# Patient Record
Sex: Female | Born: 1955 | Race: White | Hispanic: No | Marital: Single | Smoking: Never smoker
Health system: Southern US, Community
[De-identification: ages and names within clinical notes are randomized; demographics above are authoritative.]

## PROBLEM LIST (undated history)

## (undated) DIAGNOSIS — I4891 Unspecified atrial fibrillation: Secondary | ICD-10-CM

## (undated) DIAGNOSIS — I351 Nonrheumatic aortic (valve) insufficiency: Secondary | ICD-10-CM

## (undated) DIAGNOSIS — I10 Essential (primary) hypertension: Secondary | ICD-10-CM

## (undated) DIAGNOSIS — I639 Cerebral infarction, unspecified: Secondary | ICD-10-CM

## (undated) HISTORY — PX: ABDOMINAL HYSTERECTOMY: SHX81

## (undated) HISTORY — PX: KNEE SURGERY: SHX244

## (undated) HISTORY — DX: Cerebral infarction, unspecified: I63.9

---
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
## 2014-08-14 HISTORY — PX: SHOULDER SURGERY: SHX246

## 2021-05-03 HISTORY — PX: ABLATION: SHX5711

## 2021-09-25 ENCOUNTER — Emergency Department: Payer: Medicare Other

## 2021-09-25 ENCOUNTER — Other Ambulatory Visit: Payer: Self-pay

## 2021-09-25 ENCOUNTER — Inpatient Hospital Stay (HOSPITAL_COMMUNITY)
Admission: AD | Admit: 2021-09-25 | Discharge: 2021-10-01 | DRG: 064 | Disposition: A | Discharge status: Rehabilitation Facility | Payer: Medicare Other | Source: Other Acute Inpatient Hospital | Attending: Internal Medicine | Admitting: Internal Medicine

## 2021-09-25 ENCOUNTER — Emergency Department
Admission: EM | Admit: 2021-09-25 | Discharge: 2021-09-25 | Disposition: A | Discharge status: Other Acute Inpatient Hospital | Payer: Medicare Other | Attending: Emergency Medicine | Admitting: Emergency Medicine

## 2021-09-25 ENCOUNTER — Inpatient Hospital Stay (HOSPITAL_COMMUNITY): Payer: Medicare Other

## 2021-09-25 DIAGNOSIS — R112 Nausea with vomiting, unspecified: Secondary | ICD-10-CM | POA: Insufficient documentation

## 2021-09-25 DIAGNOSIS — I619 Nontraumatic intracerebral hemorrhage, unspecified: Secondary | ICD-10-CM | POA: Diagnosis present

## 2021-09-25 DIAGNOSIS — E876 Hypokalemia: Secondary | ICD-10-CM | POA: Diagnosis not present

## 2021-09-25 DIAGNOSIS — R232 Flushing: Secondary | ICD-10-CM | POA: Diagnosis not present

## 2021-09-25 DIAGNOSIS — G936 Cerebral edema: Secondary | ICD-10-CM | POA: Diagnosis present

## 2021-09-25 DIAGNOSIS — I4891 Unspecified atrial fibrillation: Secondary | ICD-10-CM | POA: Insufficient documentation

## 2021-09-25 DIAGNOSIS — G8194 Hemiplegia, unspecified affecting left nondominant side: Secondary | ICD-10-CM | POA: Diagnosis not present

## 2021-09-25 DIAGNOSIS — R7303 Prediabetes: Secondary | ICD-10-CM | POA: Diagnosis present

## 2021-09-25 DIAGNOSIS — I480 Paroxysmal atrial fibrillation: Secondary | ICD-10-CM | POA: Insufficient documentation

## 2021-09-25 DIAGNOSIS — I618 Other nontraumatic intracerebral hemorrhage: Secondary | ICD-10-CM | POA: Diagnosis not present

## 2021-09-25 DIAGNOSIS — I669 Occlusion and stenosis of unspecified cerebral artery: Secondary | ICD-10-CM

## 2021-09-25 DIAGNOSIS — R531 Weakness: Secondary | ICD-10-CM | POA: Diagnosis not present

## 2021-09-25 DIAGNOSIS — K219 Gastro-esophageal reflux disease without esophagitis: Secondary | ICD-10-CM | POA: Diagnosis present

## 2021-09-25 DIAGNOSIS — I161 Hypertensive emergency: Secondary | ICD-10-CM | POA: Diagnosis present

## 2021-09-25 DIAGNOSIS — M792 Neuralgia and neuritis, unspecified: Secondary | ICD-10-CM | POA: Diagnosis not present

## 2021-09-25 DIAGNOSIS — I69191 Dysphagia following nontraumatic intracerebral hemorrhage: Secondary | ICD-10-CM | POA: Diagnosis not present

## 2021-09-25 DIAGNOSIS — H538 Other visual disturbances: Secondary | ICD-10-CM | POA: Diagnosis not present

## 2021-09-25 DIAGNOSIS — I69192 Facial weakness following nontraumatic intracerebral hemorrhage: Secondary | ICD-10-CM | POA: Diagnosis not present

## 2021-09-25 DIAGNOSIS — K5900 Constipation, unspecified: Secondary | ICD-10-CM | POA: Diagnosis not present

## 2021-09-25 DIAGNOSIS — D649 Anemia, unspecified: Secondary | ICD-10-CM | POA: Diagnosis not present

## 2021-09-25 DIAGNOSIS — I615 Nontraumatic intracerebral hemorrhage, intraventricular: Secondary | ICD-10-CM | POA: Diagnosis present

## 2021-09-25 DIAGNOSIS — Z79899 Other long term (current) drug therapy: Secondary | ICD-10-CM | POA: Diagnosis not present

## 2021-09-25 DIAGNOSIS — Z9911 Dependence on respirator [ventilator] status: Secondary | ICD-10-CM | POA: Diagnosis not present

## 2021-09-25 DIAGNOSIS — R29713 NIHSS score 13: Secondary | ICD-10-CM | POA: Diagnosis present

## 2021-09-25 DIAGNOSIS — R1310 Dysphagia, unspecified: Secondary | ICD-10-CM | POA: Diagnosis present

## 2021-09-25 DIAGNOSIS — Z7901 Long term (current) use of anticoagulants: Secondary | ICD-10-CM | POA: Diagnosis not present

## 2021-09-25 DIAGNOSIS — I10 Essential (primary) hypertension: Secondary | ICD-10-CM | POA: Insufficient documentation

## 2021-09-25 DIAGNOSIS — E7800 Pure hypercholesterolemia, unspecified: Secondary | ICD-10-CM | POA: Diagnosis not present

## 2021-09-25 DIAGNOSIS — R5383 Other fatigue: Secondary | ICD-10-CM | POA: Diagnosis not present

## 2021-09-25 DIAGNOSIS — R4781 Slurred speech: Secondary | ICD-10-CM | POA: Diagnosis not present

## 2021-09-25 DIAGNOSIS — I69154 Hemiplegia and 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 left non-dominant side: Secondary | ICD-10-CM | POA: Diagnosis not present

## 2021-09-25 DIAGNOSIS — R32 Unspecified urinary incontinence: Secondary | ICD-10-CM | POA: Diagnosis not present

## 2021-09-25 DIAGNOSIS — J9601 Acute respiratory failure with hypoxia: Secondary | ICD-10-CM | POA: Diagnosis present

## 2021-09-25 DIAGNOSIS — E669 Obesity, unspecified: Secondary | ICD-10-CM | POA: Diagnosis not present

## 2021-09-25 DIAGNOSIS — Z87440 Personal history of urinary (tract) infections: Secondary | ICD-10-CM | POA: Diagnosis not present

## 2021-09-25 DIAGNOSIS — I69391 Dysphagia following cerebral infarction: Secondary | ICD-10-CM | POA: Diagnosis not present

## 2021-09-25 DIAGNOSIS — R519 Headache, unspecified: Secondary | ICD-10-CM | POA: Insufficient documentation

## 2021-09-25 DIAGNOSIS — E785 Hyperlipidemia, unspecified: Secondary | ICD-10-CM | POA: Diagnosis present

## 2021-09-25 DIAGNOSIS — I69254 Hemiplegia and hemiparesis following other nontraumatic intracranial hemorrhage affecting left non-dominant side: Secondary | ICD-10-CM

## 2021-09-25 DIAGNOSIS — R300 Dysuria: Secondary | ICD-10-CM | POA: Diagnosis not present

## 2021-09-25 DIAGNOSIS — N390 Urinary tract infection, site not specified: Secondary | ICD-10-CM | POA: Diagnosis not present

## 2021-09-25 DIAGNOSIS — M79605 Pain in left leg: Secondary | ICD-10-CM | POA: Diagnosis not present

## 2021-09-25 DIAGNOSIS — R1312 Dysphagia, oropharyngeal phase: Secondary | ICD-10-CM | POA: Diagnosis not present

## 2021-09-25 DIAGNOSIS — I610 Nontraumatic intracerebral hemorrhage in hemisphere, subcortical: Secondary | ICD-10-CM | POA: Diagnosis not present

## 2021-09-25 DIAGNOSIS — I351 Nonrheumatic aortic (valve) insufficiency: Secondary | ICD-10-CM | POA: Diagnosis not present

## 2021-09-25 DIAGNOSIS — I6389 Other cerebral infarction: Secondary | ICD-10-CM | POA: Diagnosis not present

## 2021-09-25 DIAGNOSIS — R202 Paresthesia of skin: Secondary | ICD-10-CM | POA: Diagnosis not present

## 2021-09-25 DIAGNOSIS — Z1152 Encounter for screening for COVID-19: Secondary | ICD-10-CM | POA: Diagnosis not present

## 2021-09-25 HISTORY — DX: Essential (primary) hypertension: I10

## 2021-09-25 HISTORY — DX: Nonrheumatic aortic (valve) insufficiency: I35.1

## 2021-09-25 HISTORY — DX: Unspecified atrial fibrillation: I48.91

## 2021-09-25 LAB — URINALYSIS, ROUTINE W REFLEX MICROSCOPIC
Bilirubin Urine: NEGATIVE
Glucose, UA: NEGATIVE mg/dL
Hgb urine dipstick: NEGATIVE
Ketones, ur: 5 mg/dL — AB
Leukocytes,Ua: NEGATIVE
Nitrite: NEGATIVE
Protein, ur: NEGATIVE mg/dL
Specific Gravity, Urine: 1.01 (ref 1.005–1.030)
pH: 6 (ref 5.0–8.0)

## 2021-09-25 LAB — DIFFERENTIAL
Abs Immature Granulocytes: 0.04 10*3/uL (ref 0.00–0.07)
Basophils Absolute: 0.1 10*3/uL (ref 0.0–0.1)
Basophils Relative: 1 %
Eosinophils Absolute: 0.2 10*3/uL (ref 0.0–0.5)
Eosinophils Relative: 2 %
Immature Granulocytes: 1 %
Lymphocytes Relative: 39 %
Lymphs Abs: 3.4 10*3/uL (ref 0.7–4.0)
Monocytes Absolute: 0.5 10*3/uL (ref 0.1–1.0)
Monocytes Relative: 5 %
Neutro Abs: 4.7 10*3/uL (ref 1.7–7.7)
Neutrophils Relative %: 52 %

## 2021-09-25 LAB — CBG MONITORING, ED: Glucose-Capillary: 108 mg/dL — ABNORMAL HIGH (ref 70–99)

## 2021-09-25 LAB — COMPREHENSIVE METABOLIC PANEL
ALT: 16 U/L (ref 0–44)
AST: 20 U/L (ref 15–41)
Albumin: 4.3 g/dL (ref 3.5–5.0)
Alkaline Phosphatase: 78 U/L (ref 38–126)
Anion gap: 12 (ref 5–15)
BUN: 13 mg/dL (ref 8–23)
CO2: 23 mmol/L (ref 22–32)
Calcium: 9.4 mg/dL (ref 8.9–10.3)
Chloride: 103 mmol/L (ref 98–111)
Creatinine, Ser: 0.66 mg/dL (ref 0.44–1.00)
GFR, Estimated: >60 mL/min (ref >60)
Glucose, Bld: 113 mg/dL — ABNORMAL HIGH (ref 70–99)
Potassium: 3.2 mmol/L — ABNORMAL LOW (ref 3.5–5.1)
Sodium: 138 mmol/L (ref 135–145)
Total Bilirubin: 0.7 mg/dL (ref 0.3–1.2)
Total Protein: 7.7 g/dL (ref 6.5–8.1)

## 2021-09-25 LAB — APTT: aPTT: 34 seconds (ref 24–36)

## 2021-09-25 LAB — POCT I-STAT 7, (LYTES, BLD GAS, ICA,H+H)
Acid-base deficit: 3 mmol/L — ABNORMAL HIGH (ref 0.0–2.0)
Bicarbonate: 22.5 mmol/L (ref 20.0–28.0)
Calcium, Ion: 1.2 mmol/L (ref 1.15–1.40)
HCT: 29 % — ABNORMAL LOW (ref 36.0–46.0)
Hemoglobin: 9.9 g/dL — ABNORMAL LOW (ref 12.0–15.0)
O2 Saturation: 98 %
Patient temperature: 36.2
Potassium: 4.1 mmol/L (ref 3.5–5.1)
Sodium: 143 mmol/L (ref 135–145)
TCO2: 24 mmol/L (ref 22–32)
pCO2 arterial: 38.3 mmHg (ref 32–48)
pH, Arterial: 7.373 (ref 7.35–7.45)
pO2, Arterial: 99 mmHg (ref 83–108)

## 2021-09-25 LAB — PROTIME-INR
INR: 1.2 (ref 0.8–1.2)
Prothrombin Time: 14.8 seconds (ref 11.4–15.2)

## 2021-09-25 LAB — CBC
HCT: 37.4 % (ref 36.0–46.0)
Hemoglobin: 12 g/dL (ref 12.0–15.0)
MCH: 29.3 pg (ref 26.0–34.0)
MCHC: 32.1 g/dL (ref 30.0–36.0)
MCV: 91.4 fL (ref 80.0–100.0)
Platelets: 273 10*3/uL (ref 150–400)
RBC: 4.09 MIL/uL (ref 3.87–5.11)
RDW: 14.4 % (ref 11.5–15.5)
WBC: 8.8 10*3/uL (ref 4.0–10.5)
nRBC: 0 % (ref 0.0–0.2)

## 2021-09-25 LAB — SODIUM: Sodium: 141 mmol/L (ref 135–145)

## 2021-09-25 IMAGING — CT CT HEAD W/O CM
4 series · 15 of 47 positions shown, 17 images · non-contrast
Comparison: Prior CT from earlier the same day.

CLINICAL DATA: Follow-up examination for intracranial hemorrhage.



[Series 2: head wo · axial · 0.42mm/px · z∈[-101,+9]mm · 6 of 32 slices shown, 8 images]
[im 5/32  brain]
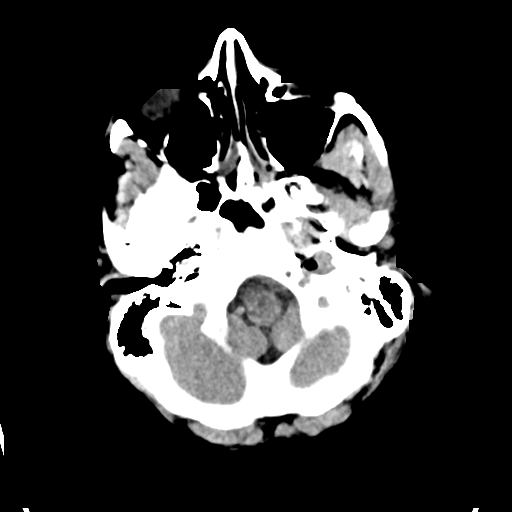
[im 5/32  bone]
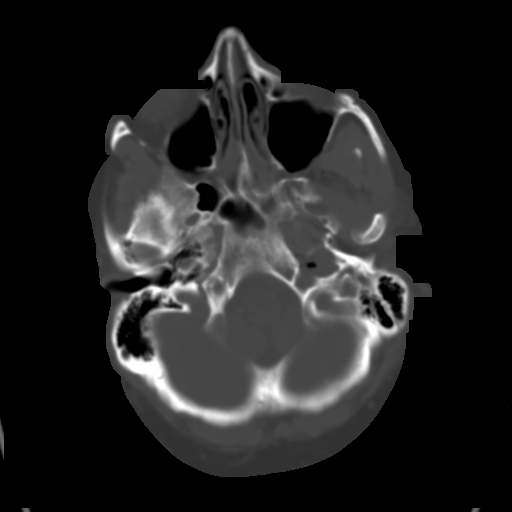
[im 9/32  brain]
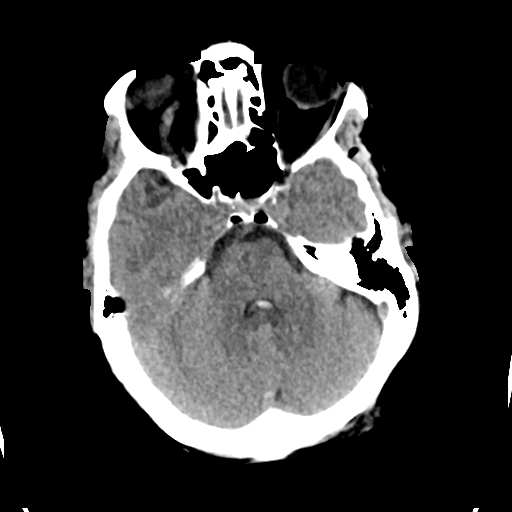
[im 14/32  brain]
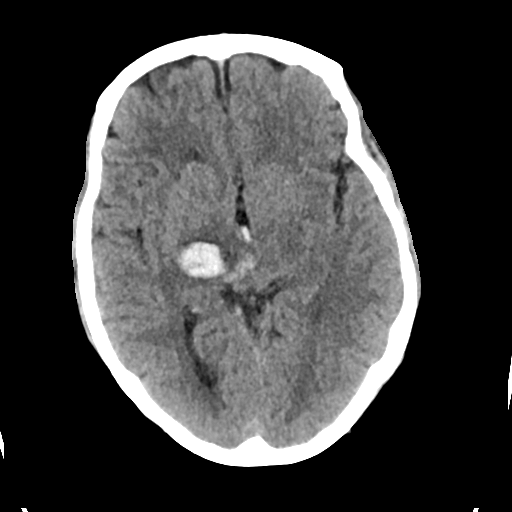
[im 18/32  brain]
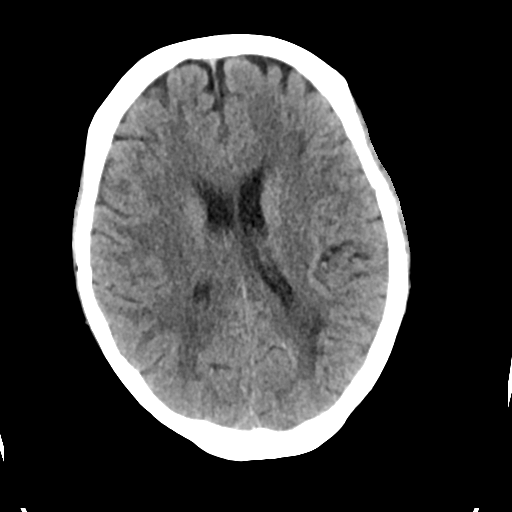
[im 23/32  brain]
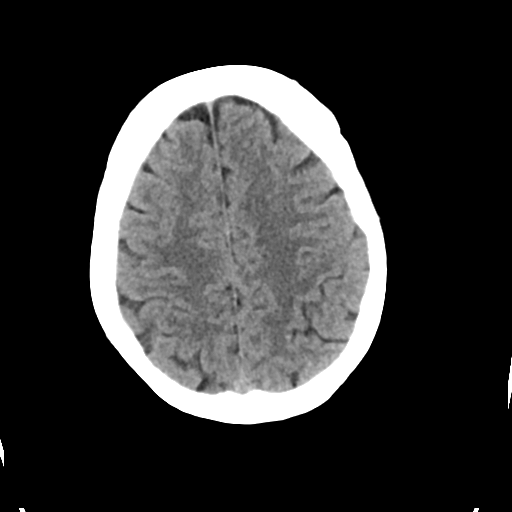
[im 23/32  bone]
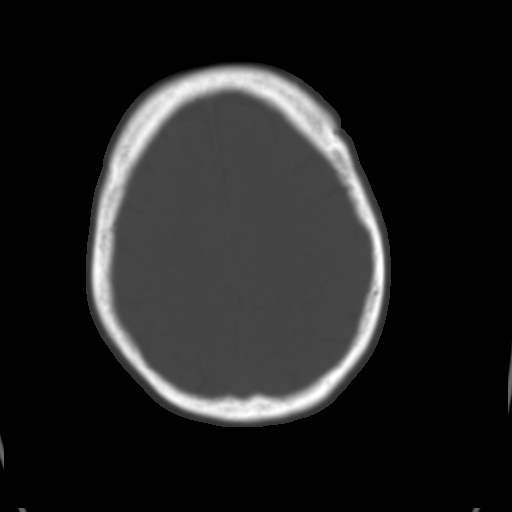
[im 27/32  brain]
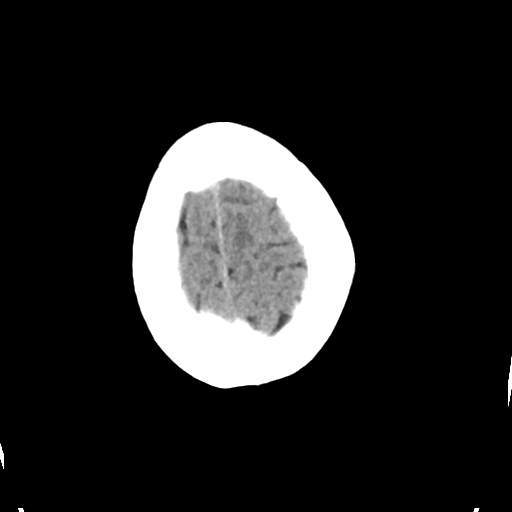

[Series 3: head bone · axial · 0.42mm/px · z∈[-107,-69]mm · 3 of 81 slices shown]
[im 8/81  bone]
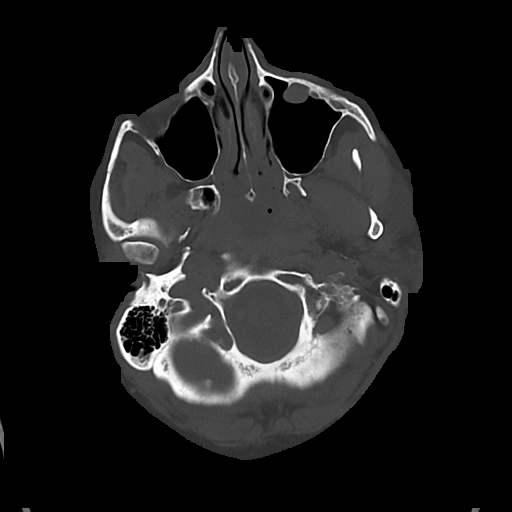
[im 16/81  bone]
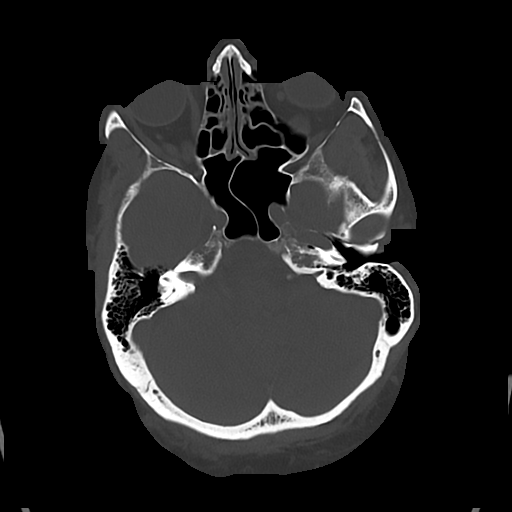
[im 27/81  bone]
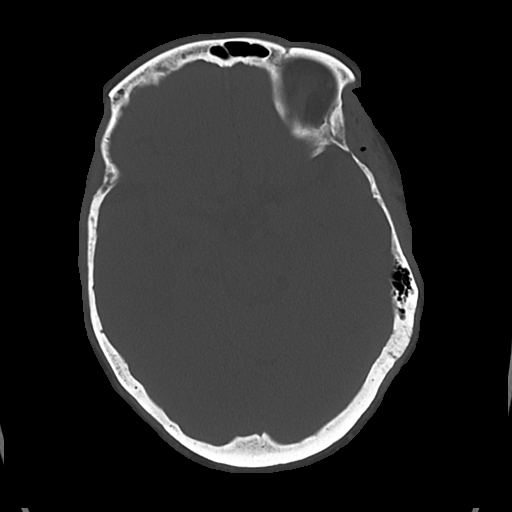

[Series 4: cor soft · coronal · 0.32mm/px · 3 of 66 slices shown]
[im 22/66  brain]
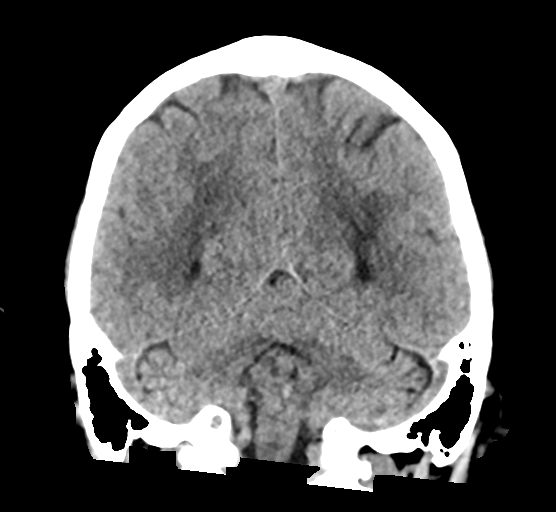
[im 29/66  brain]
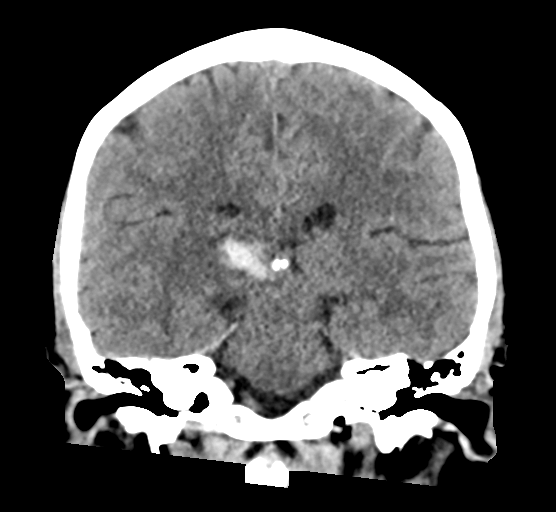
[im 37/66  brain]
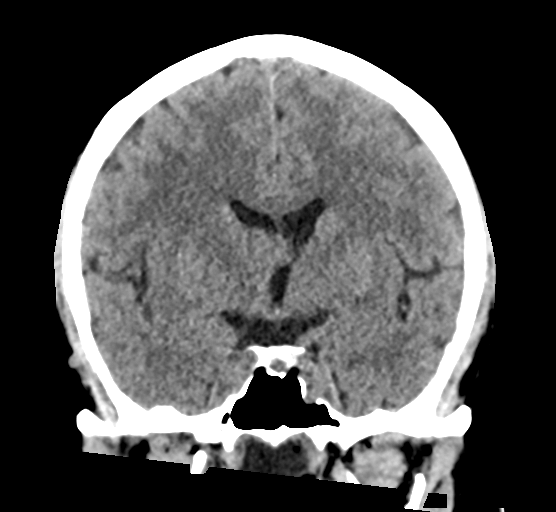

[Series 5: sag soft · sagittal · 0.32mm/px · 3 of 52 slices shown]
[im 18/52  brain]
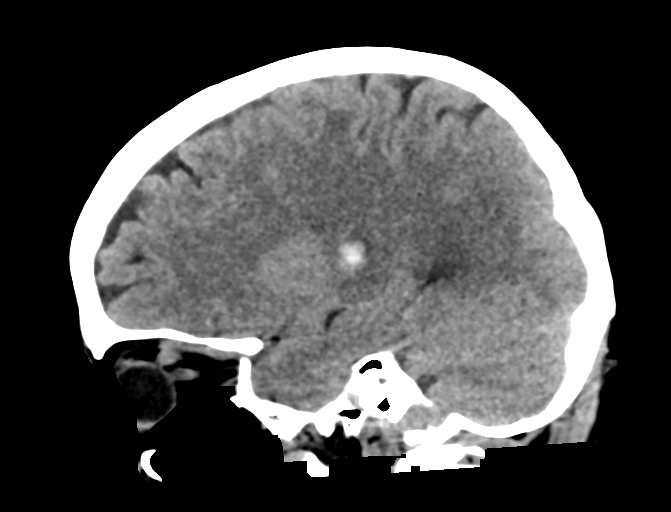
[im 26/52  brain]
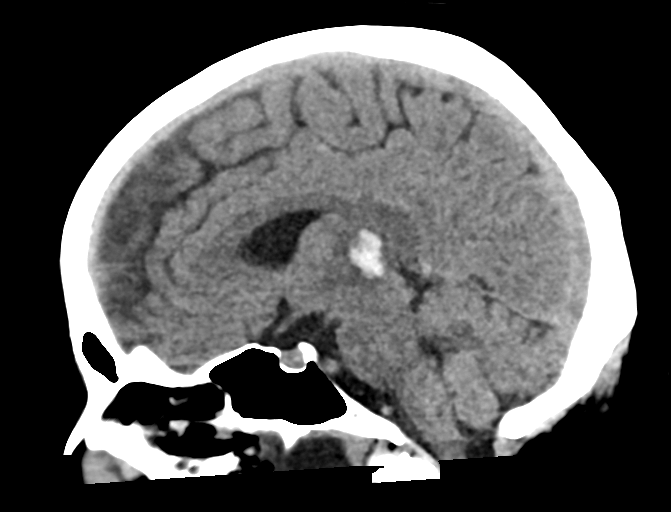
[im 35/52  brain]
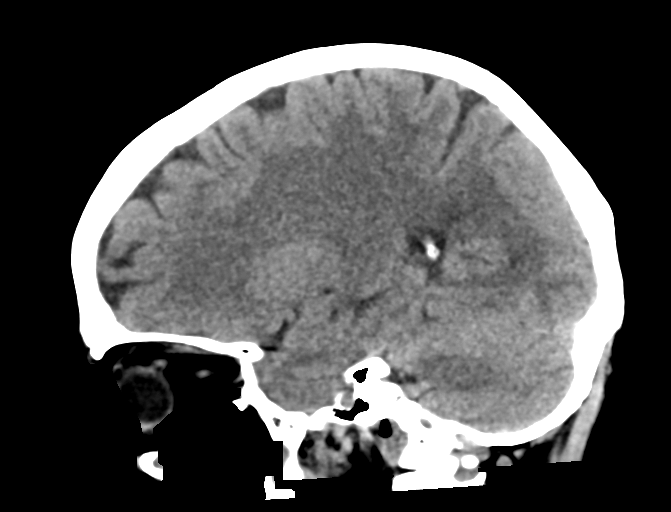

[15 of 47 positions shown; findings below may reference images not displayed]

FINDINGS: Brain: Previously identified acute intraparenchymal hemorrhage
centered at the right thalamus again seen, not significantly changed
in size measuring 2.2 x 1.5 x 2.3 cm (estimated volume 4 mL, stable
when measured in a similar fashion). Mild inferior extension towards
the right cerebral peduncle. Intraventricular extension with small
volume hemorrhage within the third and fourth ventricles, stable. No
hydrocephalus or trapping.

No other new acute intracranial hemorrhage. No acute large vessel
territory infarct. Scattered hypodensity involving the
supratentorial cerebral white matter noted, nonspecific, but most
likely related chronic microvascular ischemic disease. No mass
lesion or significant midline shift. No extra-axial fluid
collection.

Vascular: No hyperdense vessel.

Skull: Scalp soft tissues and calvarium within normal limits. Few
scattered foci of soft tissue emphysema within the left temporal
region likely related IV access.

Sinuses/Orbits: Globes and orbital soft tissues within normal
limits. Scattered mucosal thickening noted about the ethmoidal air
cells. Small retention cysts versus polyps noted within the
visualized left maxillary sinus. Mastoid air cells are clear.

Other: None.
IMPRESSION: 1. No significant interval change in size of acute intraparenchymal
hemorrhage centered at the right thalamus, estimated volume 4 mL.
Small volume intraventricular extension with blood in the third and
fourth ventricles. No hydrocephalus or trapping.
2. No other new acute intracranial abnormality.

## 2021-09-25 IMAGING — DX DG CHEST 1V
1 series · 1 of 1 positions shown · non-contrast
Comparison: Concurrently performed abdominal radiograph [DATE].

CLINICAL DATA: Provided history: Intubation. Status post intubation
and OG tube placement.

EXAM:
CHEST  1 VIEW

[chest ap]
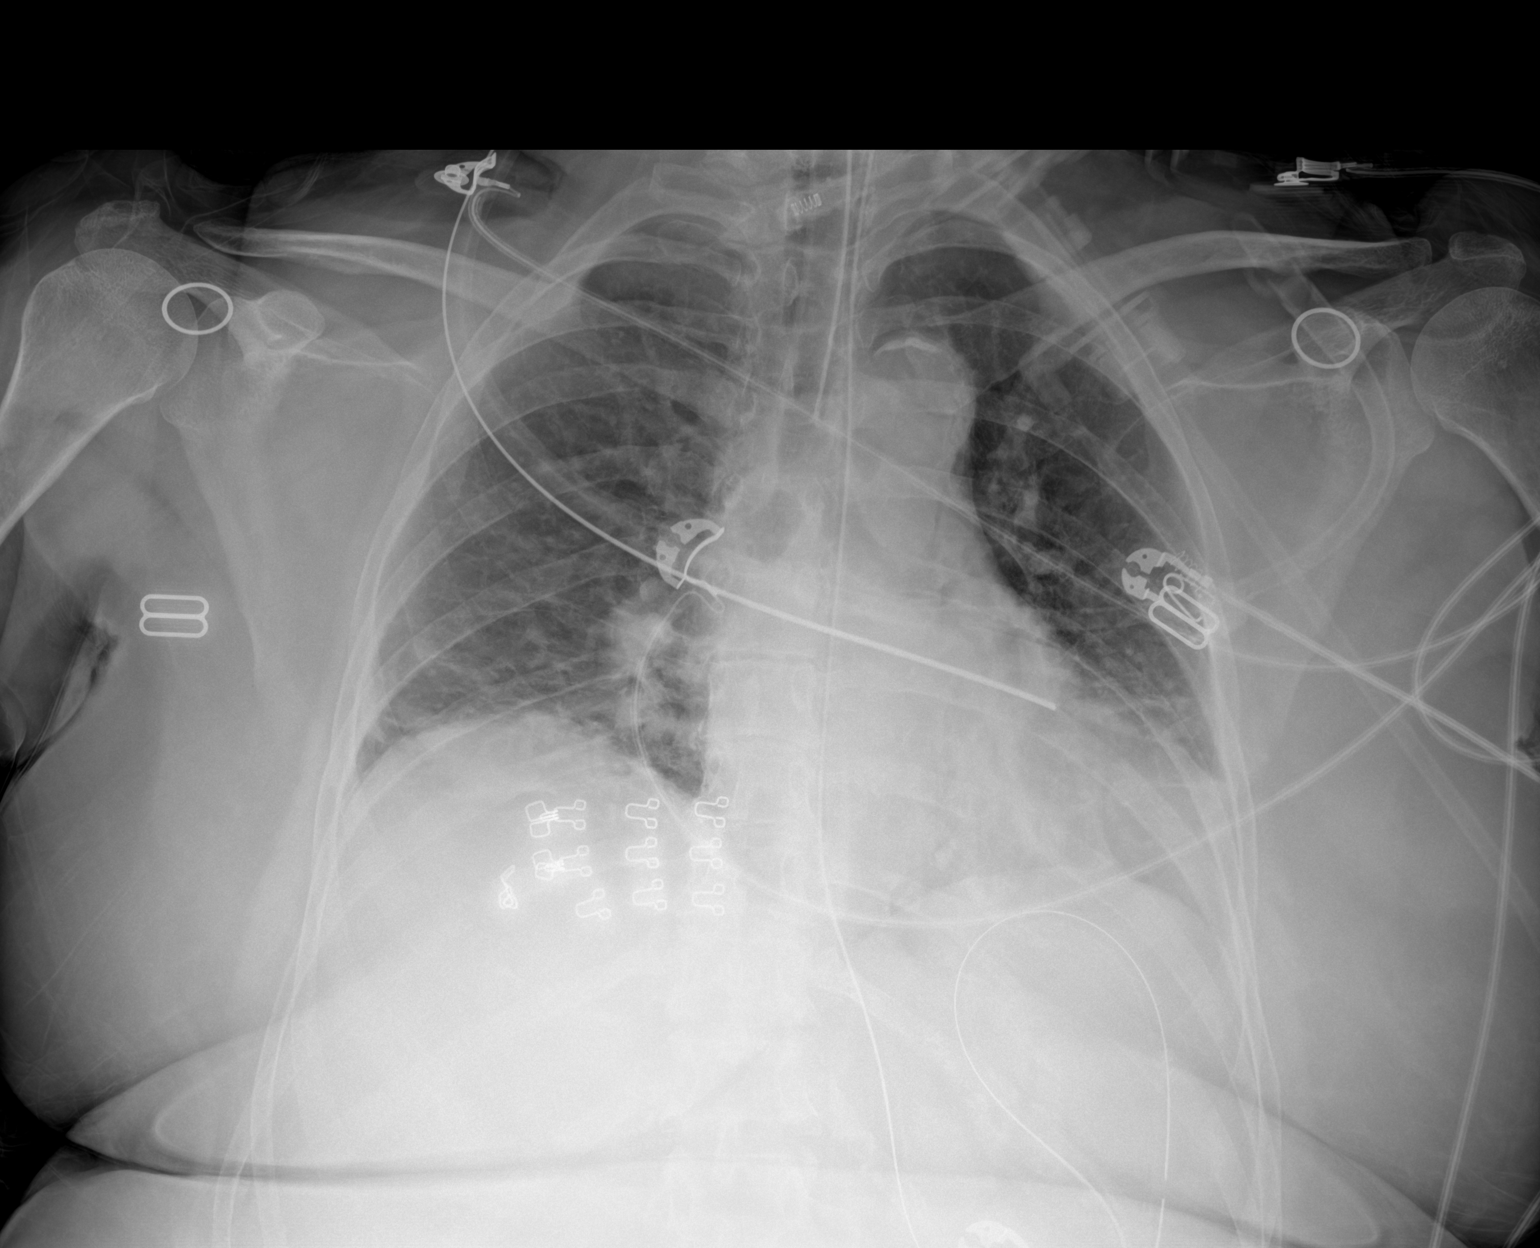

[1 of 1 positions shown; findings below may reference images not displayed]

FINDINGS: ET tube present with tip terminating 2.2 cm above the level of the
carina. An enteric tube passes below level of left hemidiaphragm and
is coiled within the stomach.

Heart size within normal limits. Aortic atherosclerosis. Mild
bibasilar atelectasis. No appreciable airspace consolidation. No
evidence of pleural effusion or pneumothorax. No acute bony
abnormality identified.
IMPRESSION: Support apparatus, as described.

Mild bibasilar atelectasis.

Aortic Atherosclerosis ([9S]-[9S]).

## 2021-09-25 IMAGING — DX DG ABDOMEN 1V
1 series · 1 of 1 positions shown · non-contrast
Comparison: None Available.

CLINICAL DATA: Placement of NG tube

EXAM:
ABDOMEN - 1 VIEW

[abdomen supine]
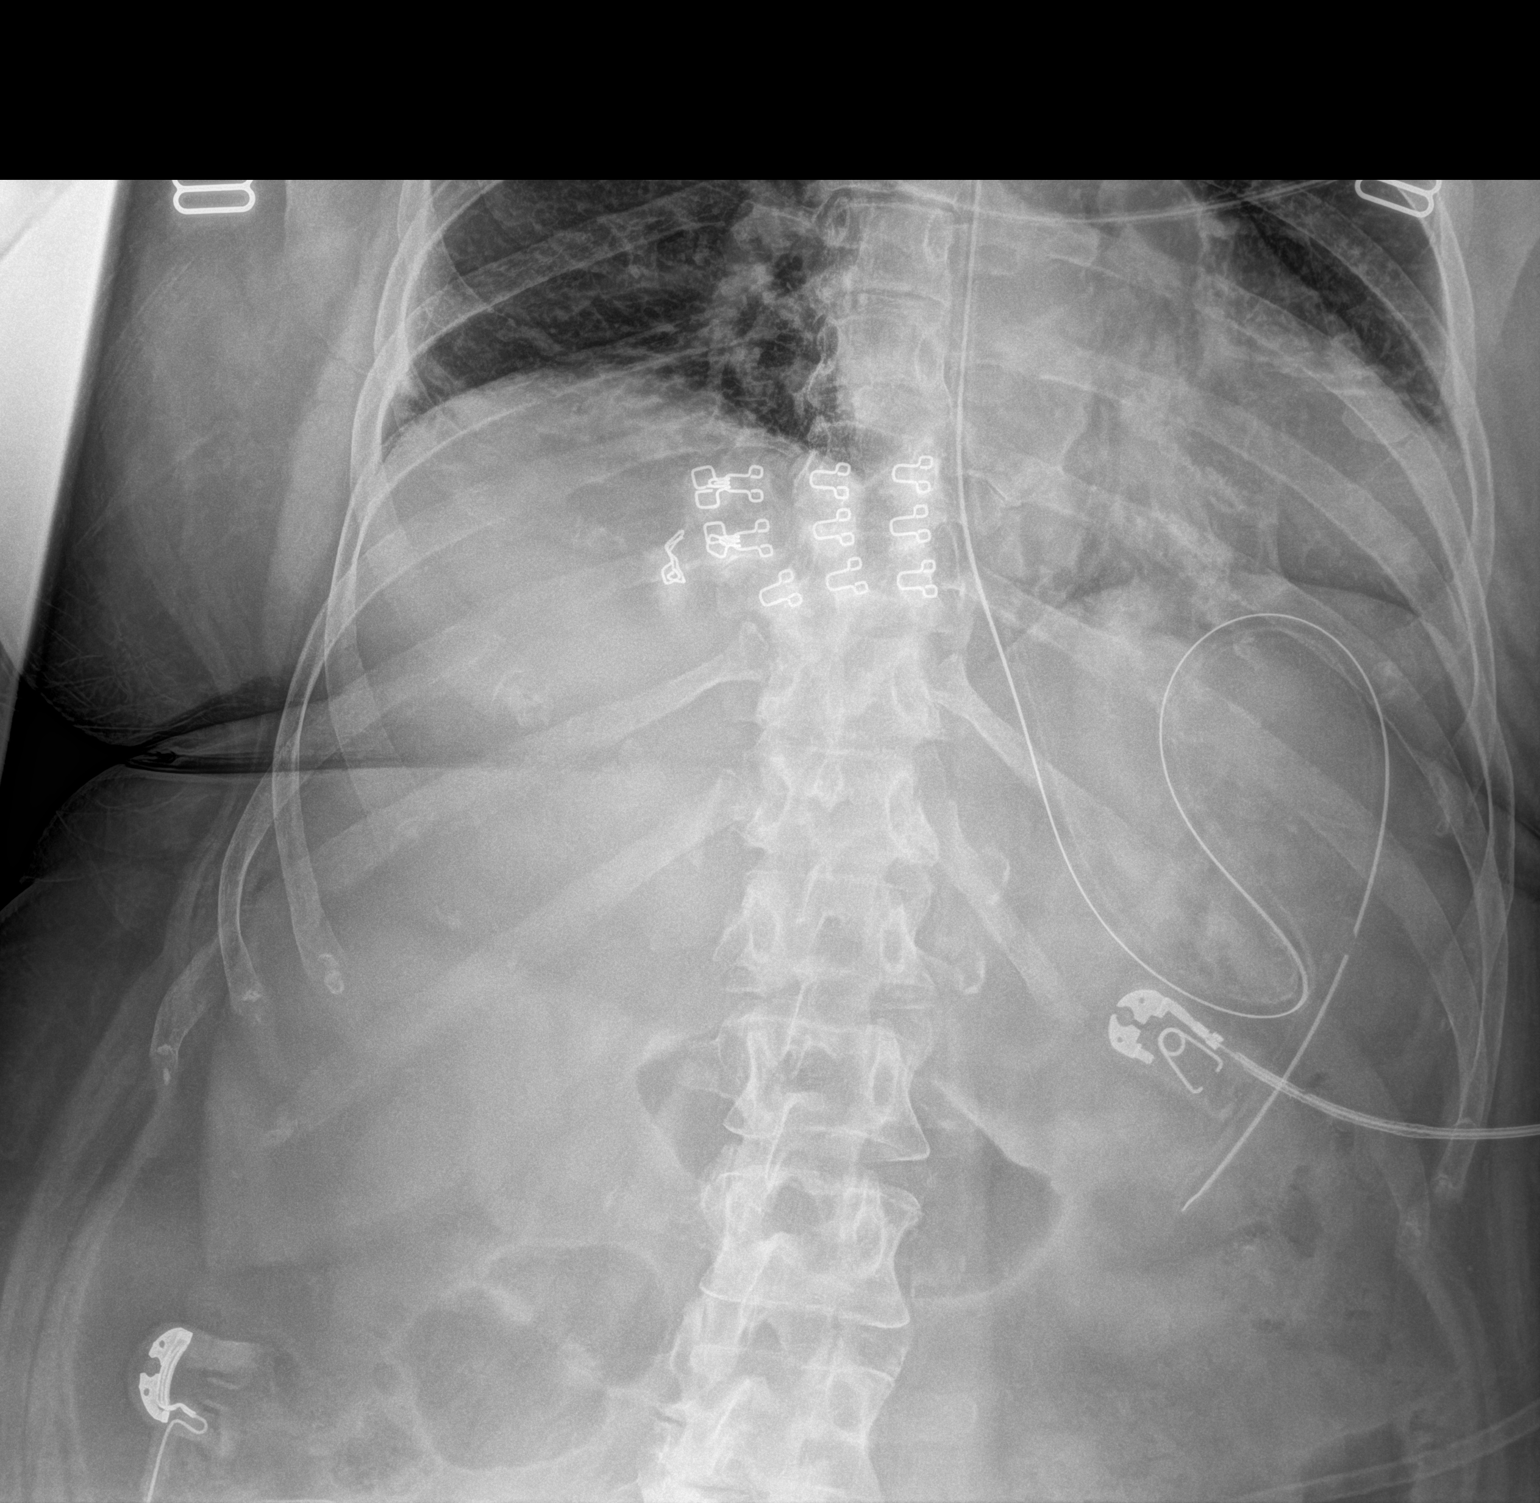

[1 of 1 positions shown; findings below may reference images not displayed]

FINDINGS: Distal portion of NG tube is coiled within the fundus of the stomach
with its tip pointing coracoid in the region of body of the stomach.
Visualized bowel gas pattern is unremarkable. There are linear
densities in the lower lung fields suggesting scarring or
subsegmental atelectasis.
IMPRESSION: Tip of NG tube is seen in the stomach.

## 2021-09-25 IMAGING — DX DG ABDOMEN 1V
1 series · 1 of 1 positions shown · non-contrast
Comparison: [DATE]

CLINICAL DATA: OG tube placement

EXAM:
ABDOMEN - 1 VIEW

[abdomen]
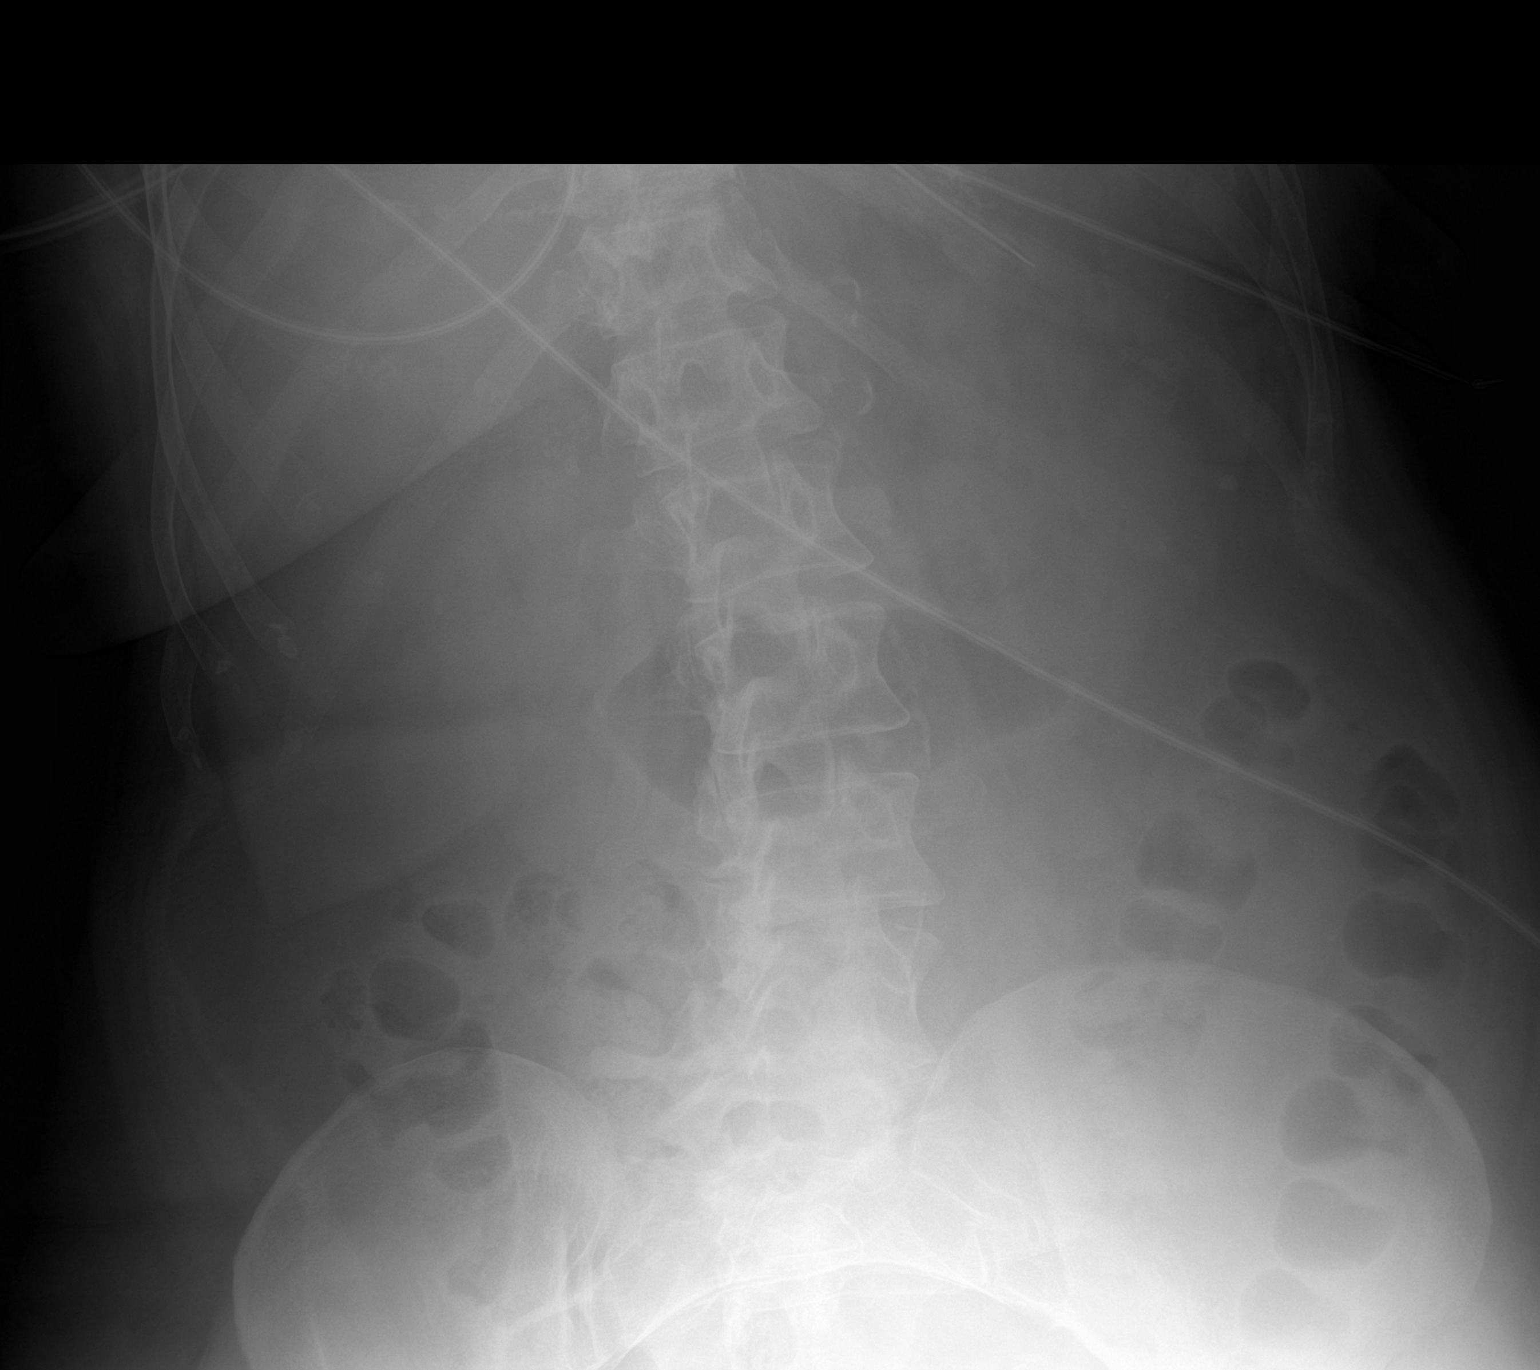

[1 of 1 positions shown; findings below may reference images not displayed]

FINDINGS: Esophageal tube tip overlies the stomach, side port probably in the
region of GE junction. Possible dilated segment of small bowel in
the central abdomen. There is gas present in the colon
IMPRESSION: Esophageal tube tip overlies the stomach, side-port in the region of
GE junction, further advancement suggested for more optimal
positioning

## 2021-09-25 IMAGING — CT CT HEAD CODE STROKE
4 series · 16 of 47 positions shown, 18 images · non-contrast
Comparison: None Available.

CLINICAL DATA: Code stroke.  Stroke suspected, no history given



[Series 3: head bone · axial · 0.43mm/px · z∈[-123,-95]mm · 3 of 70 slices shown]
[im 7/70  bone]
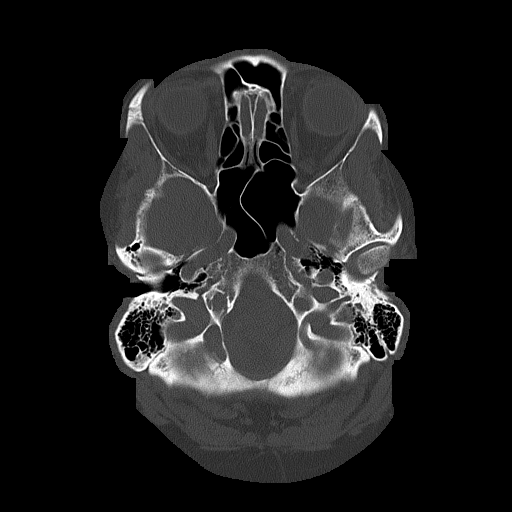
[im 14/70  bone]
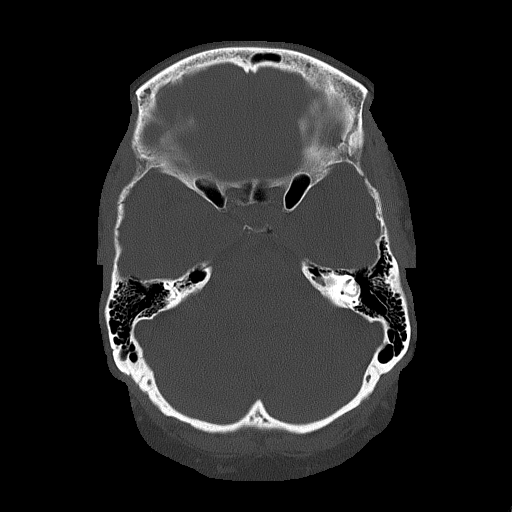
[im 21/70  bone]
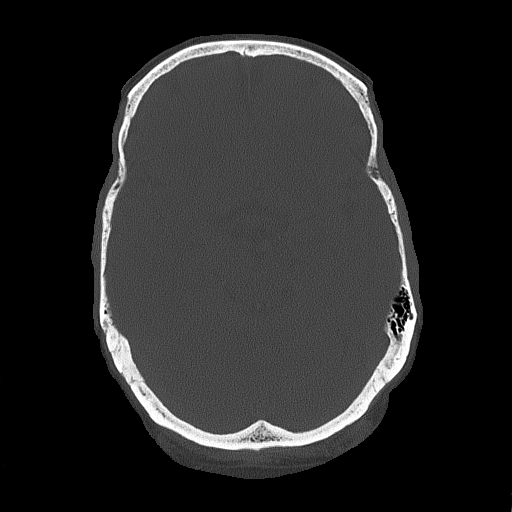

[Series 4: head wo · axial · 0.43mm/px · z∈[-120,-20]mm · 7 of 28 slices shown, 9 images]
[im 4/28  brain]
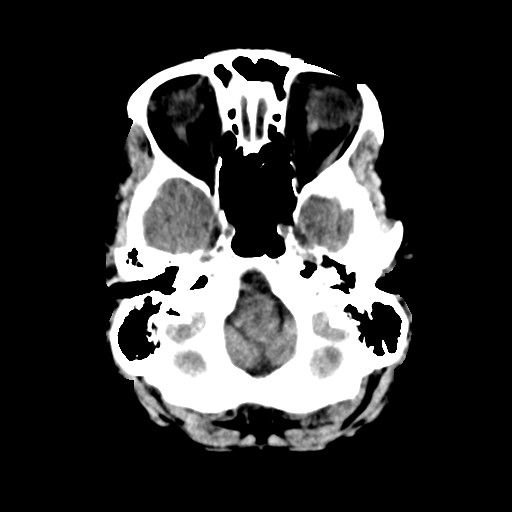
[im 4/28  bone]
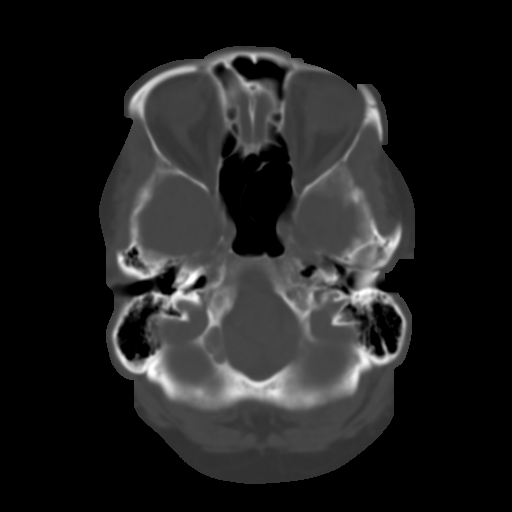
[im 7/28  brain]
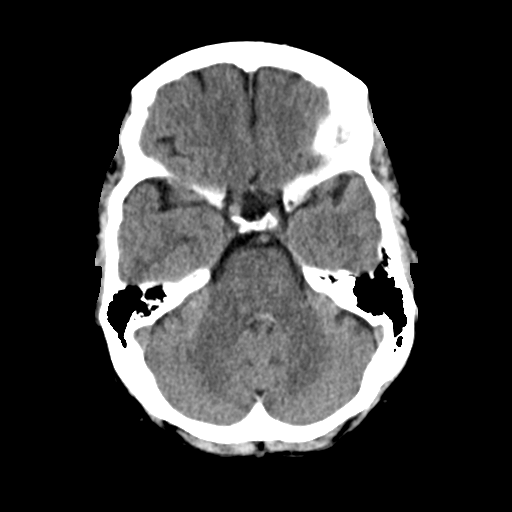
[im 11/28  brain]
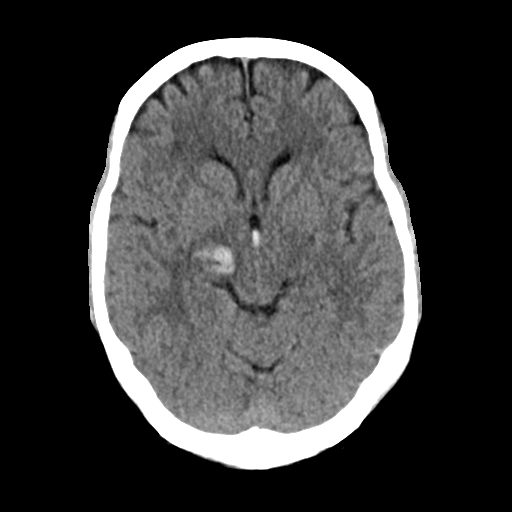
[im 14/28  brain]
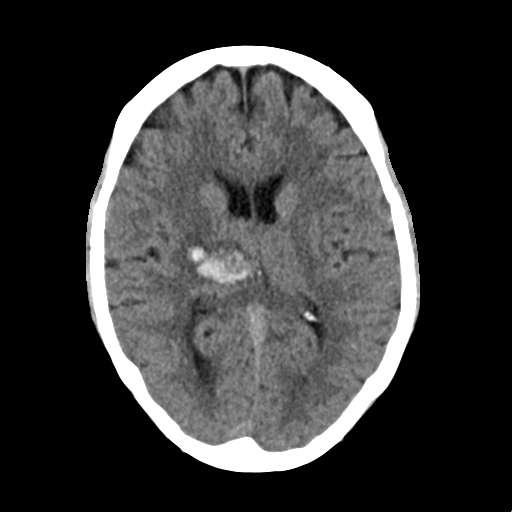
[im 17/28  brain]
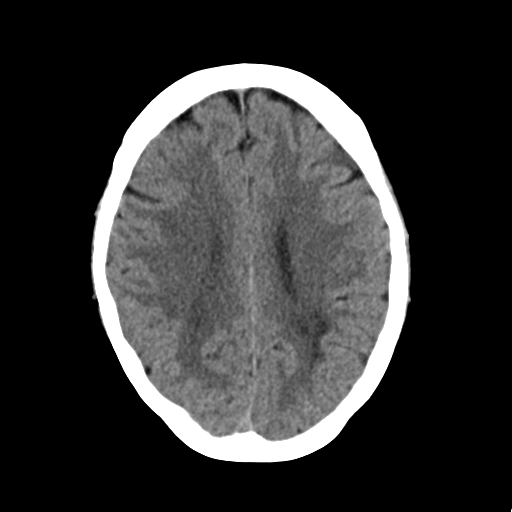
[im 17/28  bone]
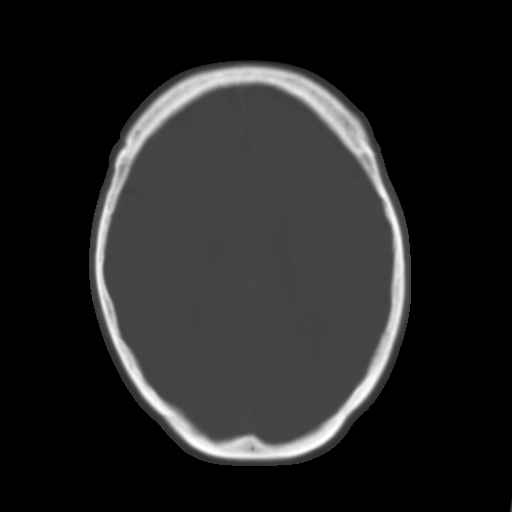
[im 21/28  brain]
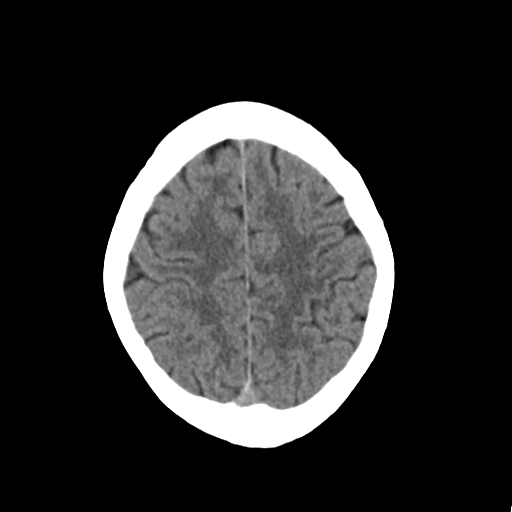
[im 24/28  brain]
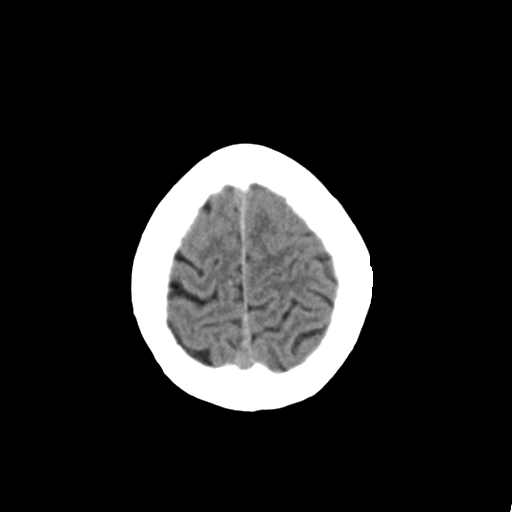

[Series 5: coronal soft tissue · coronal · 0.33mm/px · 3 of 64 slices shown]
[im 22/64  brain]
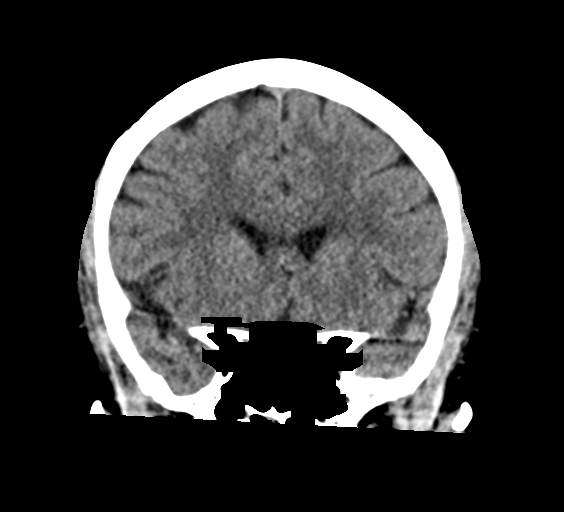
[im 29/64  brain]
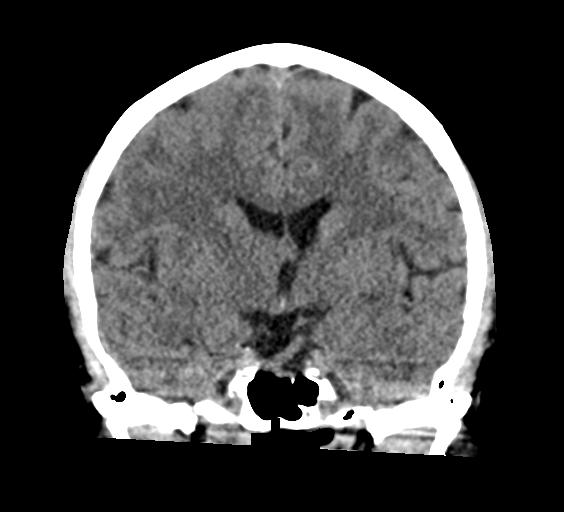
[im 36/64  brain]
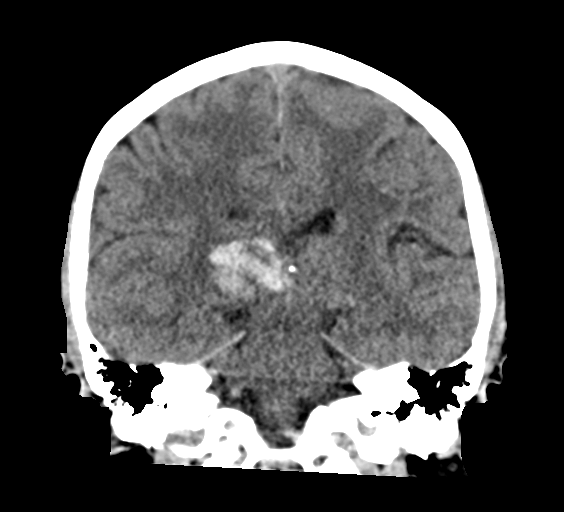

[Series 6: sagittal soft tissue · sagittal · 0.33mm/px · 3 of 52 slices shown]
[im 18/52  brain]
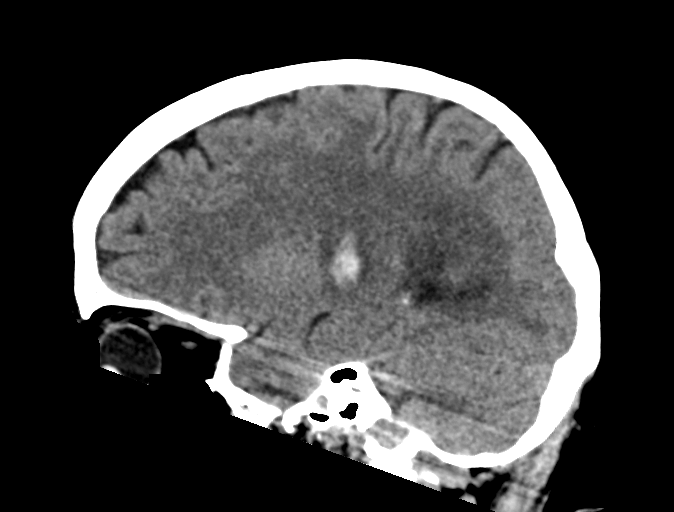
[im 26/52  brain]
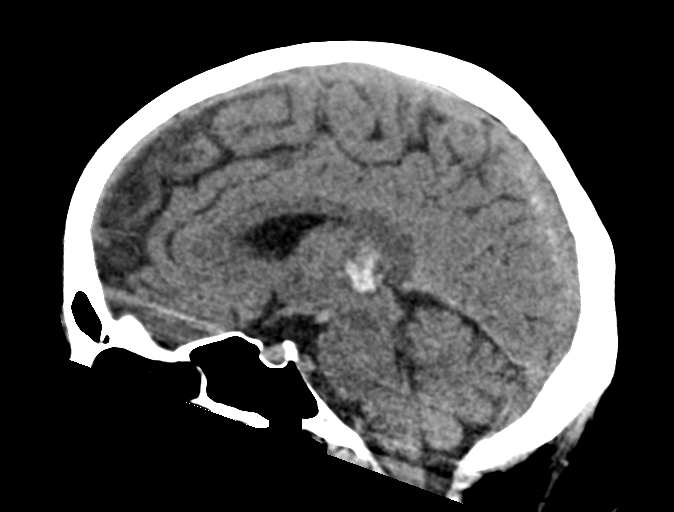
[im 35/52  brain]
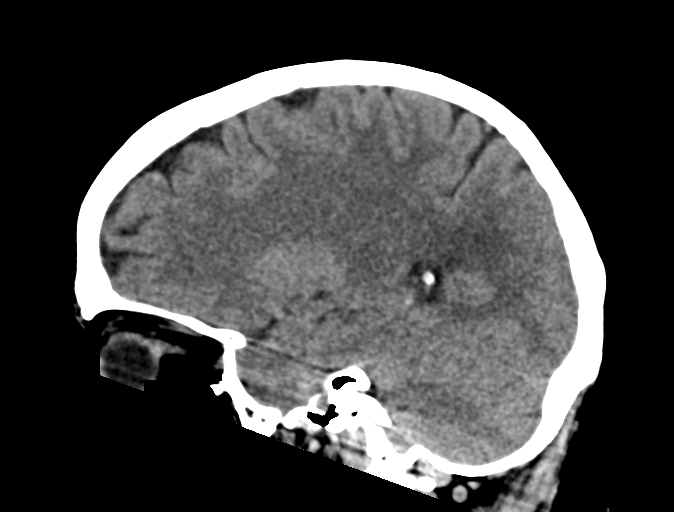

[16 of 47 positions shown; findings below may reference images not displayed]

FINDINGS: Brain: Hemorrhage centered in the right thalamus and basal ganglia,
which measures approximally 1.3 x 2.8 x 2.2 cm (AP x TR x CC)
(series 4, image 13 and series 6, image 24). Surrounding
hypodensity, likely mild edema. Hemorrhage is also noted in the
third ventricle, cerebral aqueduct, and fourth ventricle. Mild local
mass effect 1-2 mm of right-to-left midline shift midline shift. No
evidence of acute cortical infarct, hydrocephalus, or extra-axial
collection.

Vascular: No hyperdense vessel.

Skull: No acute osseous abnormality.

Sinuses/Orbits: No acute finding.

Other: The mastoids are well aerated.
IMPRESSION: Hemorrhage centered in the right thalamus and basal ganglia, with
mild surrounding edema, minimal midline shift, and a small amount of
intraventricular extension into the third and fourth ventricles.

Code stroke imaging results were communicated on [DATE] at [DATE] to provider EDER via secure text paging.

## 2021-09-25 MED ORDER — LABETALOL HCL 5 MG/ML IV SOLN
10.0000 mg | INTRAVENOUS | Status: DC | PRN
Start: 2021-09-25 — End: 2021-09-25
  Administered 2021-09-25: 10 mg via INTRAVENOUS

## 2021-09-25 MED ORDER — SODIUM CHLORIDE 3 % IV SOLN: INTRAVENOUS | Status: DC

## 2021-09-25 MED ORDER — SENNOSIDES-DOCUSATE SODIUM 8.6-50 MG PO TABS: 1.0000 | ORAL_TABLET | Freq: Two times a day (BID) | ORAL | Status: DC

## 2021-09-25 MED ORDER — CHLORHEXIDINE GLUCONATE CLOTH 2 % EX PADS
6.0000 | MEDICATED_PAD | Freq: Every day | CUTANEOUS | Status: DC
  Administered 2021-09-26 – 2021-10-01 (×6): 6 via TOPICAL

## 2021-09-25 MED ORDER — EMPTY CONTAINERS FLEXIBLE MISC
900.0000 mg | Freq: Once | Status: AC
  Administered 2021-09-25: 900 mg via INTRAVENOUS
  Filled 2021-09-25: qty 90

## 2021-09-25 MED ORDER — STROKE: EARLY STAGES OF RECOVERY BOOK
Freq: Once | Status: AC
  Filled 2021-09-25: qty 1

## 2021-09-25 MED ORDER — LABETALOL HCL 5 MG/ML IV SOLN
10.0000 mg | INTRAVENOUS | Status: DC | PRN
Start: 2021-09-25 — End: 2021-09-25

## 2021-09-25 MED ORDER — PANTOPRAZOLE SODIUM 40 MG IV SOLR
40.0000 mg | Freq: Every day | INTRAVENOUS | Status: DC
  Administered 2021-09-25: 40 mg via INTRAVENOUS
  Filled 2021-09-25: qty 10

## 2021-09-25 MED ORDER — ORAL CARE MOUTH RINSE
15.0000 mL | OROMUCOSAL | Status: DC
  Administered 2021-09-26 – 2021-09-27 (×15): 15 mL via OROMUCOSAL

## 2021-09-25 MED ORDER — SODIUM CHLORIDE 0.9% FLUSH: 3.0000 mL | Freq: Once | INTRAVENOUS | Status: DC

## 2021-09-25 MED ORDER — PROPOFOL 1000 MG/100ML IV EMUL
5.0000 ug/kg/min | INTRAVENOUS | Status: DC
  Administered 2021-09-26: 10 ug/kg/min via INTRAVENOUS
  Filled 2021-09-25 (×2): qty 100

## 2021-09-25 MED ORDER — ETOMIDATE 2 MG/ML IV SOLN
INTRAVENOUS | Status: AC
  Administered 2021-09-25: 20 mg
  Filled 2021-09-25: qty 10

## 2021-09-25 MED ORDER — CLEVIDIPINE BUTYRATE 0.5 MG/ML IV EMUL
0.0000 mg/h | INTRAVENOUS | Status: DC
  Administered 2021-09-25: 2 mg/h via INTRAVENOUS

## 2021-09-25 MED ORDER — ACETAMINOPHEN 650 MG RE SUPP
650.0000 mg | RECTAL | Status: DC | PRN
  Administered 2021-09-27: 650 mg via RECTAL
  Filled 2021-09-25: qty 1

## 2021-09-25 MED ORDER — SODIUM CHLORIDE 23.4 % INJECTION (4 MEQ/ML) FOR IV ADMINISTRATION
120.0000 meq | Freq: Once | INTRAVENOUS | Status: DC
  Filled 2021-09-25: qty 30

## 2021-09-25 MED ORDER — CLEVIDIPINE BUTYRATE 0.5 MG/ML IV EMUL: 0.0000 mg/h | INTRAVENOUS | Status: DC

## 2021-09-25 MED ORDER — SODIUM CHLORIDE 3 % IV SOLN
INTRAVENOUS | Status: DC
  Administered 2021-09-25: 50 mL/h via INTRAVENOUS
  Filled 2021-09-25 (×4): qty 500

## 2021-09-25 MED ORDER — MIDAZOLAM HCL 2 MG/2ML IJ SOLN
4.0000 mg | Freq: Once | INTRAMUSCULAR | Status: AC
  Administered 2021-09-25: 4 mg via INTRAVENOUS

## 2021-09-25 MED ORDER — PROPOFOL 1000 MG/100ML IV EMUL
5.0000 ug/kg/min | INTRAVENOUS | Status: DC
  Administered 2021-09-25: 40 ug/kg/min via INTRAVENOUS
  Administered 2021-09-25: 999 ug/kg/min via INTRAVENOUS
  Filled 2021-09-25: qty 100

## 2021-09-25 MED ORDER — MIDAZOLAM HCL 2 MG/2ML IJ SOLN
INTRAMUSCULAR | Status: DC
Start: 2021-09-25 — End: 2021-09-25
  Filled 2021-09-25: qty 4

## 2021-09-25 MED ORDER — SODIUM CHLORIDE 3 % IV SOLN
INTRAVENOUS | Status: DC
Start: 2021-09-25 — End: 2021-09-26
  Filled 2021-09-25: qty 500

## 2021-09-25 MED ORDER — PROPOFOL 1000 MG/100ML IV EMUL
INTRAVENOUS | Status: AC
  Filled 2021-09-25: qty 100

## 2021-09-25 MED ORDER — POTASSIUM CHLORIDE 10 MEQ/100ML IV SOLN
10.0000 meq | INTRAVENOUS | Status: AC
  Administered 2021-09-25 (×2): 10 meq via INTRAVENOUS
  Filled 2021-09-25 (×2): qty 100

## 2021-09-25 MED ORDER — ONDANSETRON HCL 4 MG/2ML IJ SOLN
INTRAMUSCULAR | Status: AC
  Filled 2021-09-25: qty 2

## 2021-09-25 MED ORDER — ACETAMINOPHEN 160 MG/5ML PO SOLN
650.0000 mg | ORAL | Status: DC | PRN
  Administered 2021-09-26: 650 mg
  Filled 2021-09-25: qty 20.3

## 2021-09-25 MED ORDER — ACETAMINOPHEN 325 MG PO TABS
650.0000 mg | ORAL_TABLET | ORAL | Status: DC | PRN
  Administered 2021-09-28 – 2021-10-01 (×6): 650 mg via ORAL
  Filled 2021-09-25 (×7): qty 2

## 2021-09-25 MED ORDER — CHLORHEXIDINE GLUCONATE 0.12% ORAL RINSE (MEDLINE KIT)
15.0000 mL | Freq: Two times a day (BID) | OROMUCOSAL | Status: DC
  Administered 2021-09-25 – 2021-09-27 (×4): 15 mL via OROMUCOSAL

## 2021-09-25 MED ORDER — INSULIN ASPART 100 UNIT/ML IJ SOLN: 0.0000 [IU] | INTRAMUSCULAR | Status: DC

## 2021-09-25 MED ORDER — SENNOSIDES-DOCUSATE SODIUM 8.6-50 MG PO TABS
1.0000 | ORAL_TABLET | Freq: Two times a day (BID) | ORAL | Status: DC
  Administered 2021-09-26: 1
  Filled 2021-09-25: qty 1

## 2021-09-25 MED ORDER — FENTANYL 2500MCG IN NS 250ML (10MCG/ML) PREMIX INFUSION: 0.0000 ug/h | INTRAVENOUS | Status: DC

## 2021-09-25 MED ORDER — FENTANYL 2500MCG IN NS 250ML (10MCG/ML) PREMIX INFUSION
0.0000 ug/h | INTRAVENOUS | Status: DC
  Administered 2021-09-25: 25 ug/h via INTRAVENOUS
  Filled 2021-09-25: qty 250

## 2021-09-25 MED ORDER — SUCCINYLCHOLINE CHLORIDE 200 MG/10ML IV SOSY
PREFILLED_SYRINGE | INTRAVENOUS | Status: AC
  Administered 2021-09-25: 120 mg
  Filled 2021-09-25: qty 10

## 2021-09-25 MED ORDER — SODIUM CHLORIDE 3 % IV BOLUS
250.0000 mL | Freq: Once | INTRAVENOUS | Status: AC
  Administered 2021-09-25: 250 mL via INTRAVENOUS
  Filled 2021-09-25: qty 500

## 2021-09-25 NOTE — Consult Note (Signed)
Neurology Consultation Reason for Consult: Code stroke Requesting Physician: Rada Hay  CC: Left sided weakness   History is obtained from: Patient and chart review  HPI: Charlotte Hunter is a 66 y.o. female with a past medical history significant for paroxysmal atrial fibrillation on Eliquis, hypertension.  She recently had an ablation procedure for her atrial fibrillation and was still on Eliquis.  Last cardiology note mentions that she was given Xarelto to see if that may be more affordable for her.  This was not noted until later when outside records were synchronized into our EMR, and additionally the patient did report that she was on Eliquis.  She was initially reversed per protocol with 900 mg of Andexxa, but given the possibility of Xarelto I have ordered an additional 900 mg in consultation with pharmacy  LKW: 2 PM tPA given?: No, ICH with IVH Premorbid modified rankin scale:      0 - No symptoms.  ICH Score: 1  Time performed: 14:50 GCS: 13-15 is 0 points Infratentorial: No.. If yes, 1 point -- 0  Volume: <30cc is 0 points  Age: 66 y.o.. >80 is 1 point -- 0  Intraventricular extension is 1 point A Score of 1 points has a 30 day mortality of 13%. Stroke. 2001 Apr;32(4):891-7. Please note this score does not take into account use of anticoagulation   ROS: Limited review of systems performed prior to patient intubation is negative for any infective symptoms, any other recent focal neurological deficits, nausea, vomiting, emesis prior to today.  She endorses having a headache  Please see pertinent past medical history as documented in HPI above  Per outside records, She has a past surgical history that includes Hysterectomy W/Removal Tubes &/Or Ovaries Vaginal and Appendectomy.   Unable to review family history with patient in the emergent setting  Social History:  has no history on file for tobacco use, alcohol use, and drug use.   Outpatient medications as  listed in cardiology note from 09/18/2021 acetaminophen (TYLENOL ARTHRITIS ORAL) Take 650 mg by mouth as needed  cholecalciferol, vitamin D3, 10 mcg (400 unit) Cap Take by mouth once daily  cyanocobalamin (VITAMIN B12) 1000 MCG tablet Take by mouth once daily  estradioL (ESTRACE) 0.5 MG tablet Take by mouth  hydrocortisone 2.5 % cream as needed  losartan (COZAAR) 50 MG tablet Take 1 tablet (50 mg total) by mouth once daily (Patient taking differently: Take 50 mg by mouth once daily Pt takes 25mg  instead of50) 90 tablet 3  metoprolol succinate (TOPROL-XL) 50 MG XL tablet Take 0.5 tablets (25 mg total) by mouth once daily 90 tablet 3  multivitamin tablet Take by mouth  nitrofurantoin (MACRODANTIN) 50 MG capsule Take by mouth once daily  progesterone (PROMETRIUM) 100 MG capsule Take 100 mg by mouth once daily  rhubarb root extract (ESTROVEN CMPLT MENOPAUSE RLF) 4 mg Tab Take by mouth once daily  zinc gluconate 50 mg tablet Take by mouth once daily  docosahexaenoic acid-epa 120-180 mg Cap Take by mouth once daily  rivaroxaban (XARELTO) 20 mg tablet Take 1 tablet (20 mg total) by mouth once daily 30 tablet 11   Note prior to this visit patient was on Eliquis, and it is unclear if he actually switched to Xarelto or not  Exam: Current vital signs: BP (!) 110/55   Pulse 63   Temp (!) 96.7 F (35.9 C) (Rectal)   Resp (!) 21   Wt 90.8 kg   SpO2 100%  Vital signs in  last 24 hours: Temp:  [81.3 F (27.4 C)-96.7 F (35.9 C)] 96.7 F (35.9 C) (06/13 1653) Pulse Rate:  [63-100] 63 (06/13 1706) Resp:  [17-23] 21 (06/13 1706) BP: (94-153)/(54-74) 110/55 (06/13 1706) SpO2:  [92 %-100 %] 100 % (06/13 1706) FiO2 (%):  [50 %] 50 % (06/13 1515) Weight:  [90.8 kg] 90.8 kg (06/13 1504)   Physical Exam  Constitutional: Appears well-developed and well-nourished.  Psych: Affect appropriate to situation, pleasant and cooperative Eyes: No scleral injection HENT: No oropharyngeal obstruction.  MSK: no  joint deformities.  Cardiovascular: Normal rate and regular rhythm. Perfusing extremities well Respiratory: Effort normal, non-labored breathing GI: Soft.  No distension. There is no tenderness.  Skin: Warm dry and intact visible skin  Neuro: Mental Status: Patient is awake, alert, oriented to person, place, month, age, and situation. Patient is able to give a clear and coherent history. No signs of aphasia or neglect Cranial Nerves: II: Visual Fields are full. Pupils are equal, round, and reactive to light on reevaluation at about 7:30 PM, and just before intubation she was having anisocoria with the left pupil larger than the right by about 1 mm.  III,IV, VI: Initial evaluation she was able to look bilaterally but had a right gaze preference.  Just before intubation she was having significant nystagmus in primary gaze, direction changing V/VII: Facial sensation is intact to light corneal stimulation, possibly slightly brisker on the right than the left VII: Facial movement is notable for a left facial droop VIII: hearing is intact to voice Motor: Tone is normal. Bulk is normal.  No drift on the right upper or lower extremity on initial evaluation.  On the left side she had trace movement to noxious stimulation left upper and lower Sensory: Not as reactive to stimulation on the left side Cerebellar: FNF and HKS are intact on the right side Gait:  Deferred for patient safety  NIHSS total 13 Score breakdown: One-point for drowsiness, one-point for gaze preference to the right, 2 points for left facial droop, 3 points for left arm weakness, 3 points for left leg weakness, one-point for sensory loss on the left, one-point for dysarthria, one-point for neglect of left side Performed at time of patient completion of CT scan (14: 50)   I have reviewed labs in epic and the results pertinent to this consultation are:  Basic Metabolic Panel: Recent Labs  Lab 09/25/21 1442  NA 138  K 3.2*   CL 103  CO2 23  GLUCOSE 113*  BUN 13  CREATININE 0.66  CALCIUM 9.4   Potassium was repleted with 20 mEq  CBC: Recent Labs  Lab 09/25/21 1442  WBC 8.8  NEUTROABS 4.7  HGB 12.0  HCT 37.4  MCV 91.4  PLT 273    Coagulation Studies: Recent Labs    09/25/21 1442  LABPROT 14.8  INR 1.2     I have reviewed the images obtained:  Head CT personally reviewed, agree with radiology: Hemorrhage centered in the right thalamus and basal ganglia, with mild surrounding edema, minimal midline shift, and a small amount of intraventricular extension into the third and fourth ventricles  Impression: Likely hypertensive hemorrhage complicated by high risk of worsening due to anticoagulation use.   Notably patient has a listed allergy and outside records to CT contrast.  This is listed as diarrhea and a low severity allergy however daughter at bedside reports that the patient has a severe reaction to some kind of contrast and she is not sure  of the details.  Given clinical stability and index of suspicion that this is hypertensive hemorrhage without underlying vascular malformation, will hold off on CTA for now -- the only allergy patient reported to me prior to intubation with codeine ("all forms of codeine"), to which she reported she has nausea, rash and throat closing, but again this was in the setting of acute neurological decompensation  Recommendations: # Hemorrhagic stroke, likely hypertensive, possibly underlying mass, less likely hemorrhagic conversion of ischemic stroke  - Stroke labs HgbA1c, fasting lipid panel - MRI brain w/ and w/o when stabilized to eval for underlying mass, with MRA head and neck (will need to be ordered) - Stability scan in 6 hours (head CT ordered) - Frequent neuro checks, q1hr  - Echocardiogram - No antiplatelets due to [place] - Reversed with Andexxa initially is 900 mg due to patient reporting taking Eliquis at 9 AM, later an additional dose of 900 mg given  cardiology notes that patient was started on Xarelto (she may have misreported which agent she was on in the setting of her acute neurological condition); this was discussed with pharmacy - She did receive 250 cc bolus of 3%, was started on 50 mL/h due to examination findings concerning for impending herniation just prior to intubation (nystagmus, left pupil larger than right pupil) - 2 doses of 23.4% saline prepared and at bedside in case of herniation, appreciate ED providers assistant with central line placement in case this medication is needed - Every 6 hour sodium checks - Goal euglycemia, every 4 hours glucose checks with low-dose sliding scale insulin ordered - DVT PPx heparin at 24 hrs if stable, SCDs for now - Risk factor modification - Telemetry monitoring; 30 day event monitor on discharge if no arrythmias captured  - As patient is stabilized at this time, we will hold off on emergent neurosurgical consult but discussed with family that she is at high risk for needing drain placement - Blood pressure goal SBP < 140  - PT consult, OT consult, Speech consult when patient stabilized  - Long-term, estrogen use would be of stroke risk factors and would recommend discontinuing Estrace at discharge if she is indeed on this medication - To be admitted to the stroke team at [facility], discussed with Dr. Theda Sers  Daughter at bedside updated at 7:30 PM, she confirms patient is full CODE STATUS and if the patient declines she feels that the patient would want consideration for intraventricular drain placement  Lesleigh Noe MD-PhD [facility] [phone] [facility] coverage for [facility] is from 8 AM to 4 AM in-house and 4 PM to 8 PM by telephone/video. 8 PM to 8 AM emergent questions or overnight urgent questions should be addressed to [facility]urology On-call or [facility] neurohospitalist; contact information can be found on AMION  Total critical care time: 75  minutes   Critical care time was exclusive of separately billable procedures and treating other patients.   Critical care was necessary to treat or prevent imminent or life-threatening deterioration.   Critical care was time spent personally by me on the following activities: development of treatment plan with patient and/or surrogate as well as nursing, discussions with consultants/primary team, evaluation of patient's response to treatment, examination of patient, obtaining history from patient or surrogate, ordering and performing treatments and interventions, ordering and review of laboratory studies, ordering and review of radiographic studies, and re-evaluation of patient's condition as needed, as documented above.

## 2021-09-25 NOTE — ED Provider Notes (Signed)
[facility] Provider Note    Event Date/Time   First MD Initiated Contact with Patient 09/25/21 1451     (approximate)   History   Code Stroke (Pt from home LNW 1400. L side weakness with slurred speech and nausea with headache and visual disturbances)   HPI  Charlotte Hunter is a 66 y.o. female   with past medical history of atrial fibrillation on Eliquis aortic insufficiency, hypertension who presents with nausea vomiting left-sided weakness.  Started around 2 PM.  My arrival patient complaining of headache and vomiting.  She is unable to move the left side.  Stroke alert was called.  Patient says she last took Eliquis this morning.     No past medical history on file.  Patient Active Problem List   Diagnosis Date Noted   ICH (intracerebral hemorrhage) ([place]) 09/25/2021     Physical Exam  Triage Vital Signs: ED Triage Vitals  Enc Vitals Group     BP 09/25/21 1500 (!) 153/71     Pulse Rate 09/25/21 1500 85     Resp 09/25/21 1500 (!) 22     Temp 09/25/21 1612 (!) 96.7 F (35.9 C)     Temp Source 09/25/21 1612 Rectal     SpO2 09/25/21 1500 92 %     Weight 09/25/21 1504 200 lb 2.8 oz (90.8 kg)     Height --      Head Circumference --      Peak Flow --      Pain Score --      Pain Loc --      Pain Edu? --      Excl. in [place]? --     Most recent vital signs: Vitals:   09/25/21 2040 09/25/21 2105  BP: 107/67 117/66  Pulse: (!) 58 (!) 59  Resp: (!) 21 19  Temp:  (!) 97 F (36.1 C)  SpO2: 100% 100%     General: Awake, ill-appearing CV:  Good peripheral perfusion.  Resp:  Normal effort.  Abd:  No distention.  Neuro:             Awake, Alert, Oriented x 3  Other:  Patient has right-sided gaze, left pupil lightly sluggish compared to right, 3 mm bilaterally + Dysarthria and aphasia 1 out of 5 strength left arm and left leg compared to 5 out of 5 on the right   ED Results / Procedures / Treatments  Labs (all labs ordered are listed,  but only abnormal results are displayed) Labs Reviewed  COMPREHENSIVE METABOLIC PANEL - Abnormal; Notable for the following components:      Result Value   Potassium 3.2 (*)    Glucose, Bld 113 (*)    All other components within normal limits  URINALYSIS, ROUTINE W REFLEX MICROSCOPIC - Abnormal; Notable for the following components:   Color, Urine YELLOW (*)    APPearance CLEAR (*)    Ketones, ur 5 (*)    All other components within normal limits  CBG MONITORING, ED - Abnormal; Notable for the following components:   Glucose-Capillary 108 (*)    All other components within normal limits  PROTIME-INR  APTT  CBC  DIFFERENTIAL  BLOOD GAS, ARTERIAL  SODIUM  SODIUM  TRIGLYCERIDES  HEMOGLOBIN A1C  SODIUM  SODIUM  SODIUM  CBG MONITORING, ED  I-STAT CREATININE, ED     EKG  EKG interpretation performed by myself: NSR, nml axis, nml intervals, no acute ischemic changes  RADIOLOGY CT head reviewed interpreted by myself shows hemorrhage into the deep brain structures   PROCEDURES:  Critical Care performed: Yes, see critical care procedure note(s)  Procedure Name: Intubation Date/Time: 09/25/2021 5:01 PM  Performed by: Rada Hay, MDPre-anesthesia Checklist: Patient identified, Patient being monitored, Emergency Drugs available, Timeout performed and Suction available Oxygen Delivery Method: Non-rebreather mask Preoxygenation: Pre-oxygenation with 100% oxygen Induction Type: Rapid sequence Ventilation: Mask ventilation without difficulty Laryngoscope Size: Mac Grade View: Grade I Tube size: 7.5 mm Number of attempts: 1 Placement Confirmation: ETT inserted through vocal cords under direct vision, CO2 detector and Breath sounds checked- equal and bilateral    .Critical Care  Performed by: Rada Hay, MD Authorized by: Rada Hay, MD   Critical care provider statement:    Critical care time (minutes):  120   Critical care was time spent  personally by me on the following activities:  Development of treatment plan with patient or surrogate, discussions with consultants, evaluation of patient's response to treatment, examination of patient, ordering and review of laboratory studies, ordering and review of radiographic studies, ordering and performing treatments and interventions, pulse oximetry, re-evaluation of patient's condition and review of old charts   The patient is on the cardiac monitor to evaluate for evidence of arrhythmia and/or significant heart rate changes.   MEDICATIONS ORDERED IN ED: Medications  coag fact Xa recombinant (ANDEXXA) low dose infusion 900 mg (0 mg Intravenous Stopped 09/25/21 1905)  sodium chloride 3% (hypertonic) IV bolus 250 mL (0 mLs Intravenous Stopped 09/25/21 1604)  succinylcholine (ANECTINE) 200 MG/10ML syringe (120 mg  Given 09/25/21 1514)  etomidate (AMIDATE) 2 MG/ML injection (20 mg  Given 09/25/21 1513)  ondansetron (ZOFRAN) 4 MG/2ML injection (  Given 09/25/21 1510)  midazolam (VERSED) injection 4 mg (4 mg Intravenous Given 09/25/21 1523)  potassium chloride 10 mEq in 100 mL IVPB (0 mEq Intravenous Stopped 09/25/21 2122)  coag fact Xa recombinant (ANDEXXA) low dose infusion 900 mg (900 mg Intravenous Transfusing/Transfer 09/25/21 2122)     IMPRESSION / MDM / ASSESSMENT AND PLAN / ED COURSE  I reviewed the triage vital signs and the nursing notes.                              Patient's presentation is most consistent with acute presentation with potential threat to life or bodily function.  Differential diagnosis includes, but is not limited to, intracranial hemorrhage, ischemic CVA, subarachnoid hemorrhage, hypertensive emergency  Patient is 66 year old female presenting with nausea vomiting left-sided weakness and headache.  Stroke alert was called on arrival.  Prior to me seeing the patient I see that she has bleed in the deep brain structures with some midline shift.  Evaluated the  patient in CT she is actively vomiting.  Dysarthric and aphasic with left-sided weakness but is able to talk to me and follow commands.  Neurology Dr. Curly Shores at bedside.  Patient started on Cleviprex drip for goal blood pressure 163 systolic.  Blood sugar normal.  She was given Zofran for emesis.  Started hypertonic saline per neurology as well.  Patient was ultimately intubated for airway protection and expected clinical course.  She was started on propofol for sedation.  Also required fentanyl.  Blood pressure now on the lower side after sedation sedation was paused.  23% hypertonic saline at bedside to be given if patient starts to deteriorate show signs of herniation including blown pupils.  Spoke with  patient's daughter and updated her on the patient's treatment plan.   Patient remained neurologically stable in the emergency department.  Repeat physical exams patient following commands no change in pupillary exam.  Hypertonic saline 22% not given.  Central line deferred as well.  Repeat CT head was done prior to transfer to [facility].       FINAL CLINICAL IMPRESSION(S) / ED DIAGNOSES   Final diagnoses:  Hemorrhagic stroke ([place])     Rx / DC Orders   ED Discharge Orders     None        Note:  This document was prepared using Dragon voice recognition software and may include unintentional dictation errors.   Rada Hay, MD 09/25/21 [phone]

## 2021-09-25 NOTE — ED Notes (Signed)
Respiratory to bedside.

## 2021-09-25 NOTE — Progress Notes (Signed)
CODE STROKE- PHARMACY COMMUNICATION   Time CODE STROKE called/page received: [phone]  Time response to CODE STROKE was made (in person or via phone): Immediately  Time Stroke Kit retrieved from [place] (only if needed): N/A, not a candidate for TNK given cerebral hemorrhage. Patient on Xarelto at home. Andexxa ordered  Name of Provider/Nurse contacted: Dr. Curly Shores  Prior to Admission medications   Medication Sig Start Date End Date Taking? Authorizing Provider  rivaroxaban (XARELTO) 20 MG TABS tablet Take 1 tablet by mouth daily. 09/18/21  Yes [provider]  losartan (COZAAR) 50 MG tablet Take 50 mg by mouth daily. 09/07/21   [provider]  metoprolol succinate (TOPROL-XL) 50 MG 24 hr tablet Take 50 mg by mouth daily. 07/20/21   [provider]  Multiple Vitamin (MULTI-VITAMIN) tablet Take by mouth.    [provider]  pantoprazole (PROTONIX) 20 MG tablet Take 20 mg by mouth daily. 05/04/21   [provider]  propafenone (RYTHMOL SR) 225 MG 12 hr capsule Take 225 mg by mouth every 12 (twelve) hours. 05/13/21   [provider]   Benita Gutter 09/25/2021  3:53 PM

## 2021-09-25 NOTE — ED Notes (Signed)
[place] reviewed by this RN ?

## 2021-09-25 NOTE — ED Notes (Signed)
To CT

## 2021-09-25 NOTE — ED Notes (Signed)
Pt not actively pulling at tubes and is able to follow commands on sedation medication. No restraints applied at this time.

## 2021-09-25 NOTE — Progress Notes (Signed)
Code stroke activated at 1447. Patient already seen by provider at time of activation and was already in [place]. Bedside staff relayed NCCT positive for ICH. Patient was seen in [place] by neurologist for code stroke evaluation.

## 2021-09-25 NOTE — H&P (Incomplete)
Admission H&P    Chief Complaint: Acute onset of left sided weakness  HPI: Charlotte Hunter States is a 66 y.o. female with a PMHx of HTN and PAF on Eliquis who presented to the [facility] ED this afternoon with acute onset of left sided weakness. STAT CT head revealed an acute right thalamic ICH with extension into the 3rd and 4th ventricles, with associated mild adjacent edema and mild mass effect. NIHSS was 13. She was reversed with Andexxa. She has been transferred to [facility] for management of her [place].   HPI from Dr. Lyn Records Teleneurology consult note at [facility] has been reviewed: "Krisha Fantauzzi is a 66 y.o. female with a past medical history significant for paroxysmal atrial fibrillation on Eliquis, hypertension. She recently had an ablation procedure for her atrial fibrillation and was still on Eliquis.  Last cardiology note mentions that she was given Xarelto to see if that may be more affordable for her.  This was not noted until later when outside records were synchronized into our EMR, and additionally the patient did report that she was on Eliquis.  She was initially reversed per protocol with 900 mg of Andexxa, but given the possibility of Xarelto I have ordered an additional 900 mg in consultation with pharmacy."  PMHx PAF HTN  PSHx S/P ablation procedure for atrial fibrillation   No family history on file. Social History:  has no history on file for tobacco use, alcohol use, and drug use.  Allergies:  Allergies  Allergen Reactions   Cefprozil Other (See Comments)    UNKNOWN   Codeine Anaphylaxis and Itching   Morphine Hives   Oxycodone Hives   Iodinated Contrast Media Diarrhea   Lisinopril Cough    Medications Prior to Admission  Medication Sig Dispense Refill   losartan (COZAAR) 50 MG tablet Take 50 mg by mouth daily.     metoprolol succinate (TOPROL-XL) 50 MG 24 hr tablet Take 50 mg by mouth daily.     Multiple Vitamin (MULTI-VITAMIN) tablet Take by mouth.     pantoprazole  (PROTONIX) 20 MG tablet Take 20 mg by mouth daily.     propafenone (RYTHMOL SR) 225 MG 12 hr capsule Take 225 mg by mouth every 12 (twelve) hours.     rivaroxaban (XARELTO) 20 MG TABS tablet Take 1 tablet by mouth daily.      ROS: Unable to obtain due to intubation.    Physical Examination: Blood pressure 130/68, pulse 60, temperature (!) 97.5 F (36.4 C), resp. rate (!) 21, SpO2 100 %.  HEENT-  [place]/AT.  Lungs - Intubated Extremities - No edema  Neurologic Examination: Mental Status: Intubated on propofol gtt at a rate of 10, which was held for the exam. Also on fentanyl gtt, which was not held. She gradually becomes arousable and opens eyes to stimuli after propofol is held. She is able to follow simple commands. She attempts to communicate with gesticulations of her RUE. Can point to her left and right sides with her RUE in response to sensory stimuli when asked which side is being touched.  Cranial Nerves: II:  Able to indicate that her peripheral vision is intact to testing of the temporal visual fields OU. Difficult to discern pupillary reactivity shortly after propofol is held, with pupils round, equal and 2 mm.   III,IV, VI: No ptosis. Able to track to the right without difficulty. Has difficulty tracking to the left; can cross midline but cannot bury sclerae. No nystagmus.  V,VII: Corneal reflexes intact bilaterally VIII:  Hearing intact to voice IX,X: Intubated XI: Unable to move shoulder on the left.  XII: Unable to assess Motor: RUE 4+/5 proximally and distally LUE 0/5 to command and noxious RLE moves to commands but unable to formally assess strength due to residual sedation.  LLE with trace movement Sensory: With RUE, she is able to point to left and right sides correctly in response to coarse touch and pressure applied to all extremities.  Deep Tendon Reflexes:  No asymmetry.  Cerebellar: No gross ataxia to RUE.  Gait: Unable to assess   Results for orders placed or  performed during the hospital encounter of 09/25/21 (from the past 48 hour(s))  CBG monitoring, ED     Status: Abnormal   Collection Time: 09/25/21  2:39 PM  Result Value Ref Range   Glucose-Capillary 108 (H) 70 - 99 mg/dL    Comment: Glucose reference range applies only to samples taken after fasting for at least 8 hours.  Protime-INR     Status: None   Collection Time: 09/25/21  2:42 PM  Result Value Ref Range   Prothrombin Time 14.8 11.4 - 15.2 seconds   INR 1.2 0.8 - 1.2    Comment: (NOTE) INR goal varies based on device and disease states. Performed at [facility], [address] [place], [place] [address]   APTT     Status: None   Collection Time: 09/25/21  2:42 PM  Result Value Ref Range   aPTT 34 24 - 36 seconds    Comment: Performed at [facility], [address] [place], [place] [address]  CBC     Status: None   Collection Time: 09/25/21  2:42 PM  Result Value Ref Range   WBC 8.8 4.0 - 10.5 K/uL   RBC 4.09 3.87 - 5.11 MIL/uL   Hemoglobin 12.0 12.0 - 15.0 g/dL   HCT 37.4 36.0 - 46.0 %   MCV 91.4 80.0 - 100.0 fL   MCH 29.3 26.0 - 34.0 pg   MCHC 32.1 30.0 - 36.0 g/dL   RDW 14.4 11.5 - 15.5 %   Platelets 273 150 - 400 K/uL   nRBC 0.0 0.0 - 0.2 %    Comment: Performed at [facility], [address] [place], [place] [address]  Differential     Status: None   Collection Time: 09/25/21  2:42 PM  Result Value Ref Range   Neutrophils Relative % 52 %   Neutro Abs 4.7 1.7 - 7.7 K/uL   Lymphocytes Relative 39 %   Lymphs Abs 3.4 0.7 - 4.0 K/uL   Monocytes Relative 5 %   Monocytes Absolute 0.5 0.1 - 1.0 K/uL   Eosinophils Relative 2 %   Eosinophils Absolute 0.2 0.0 - 0.5 K/uL   Basophils Relative 1 %   Basophils Absolute 0.1 0.0 - 0.1 K/uL   Immature Granulocytes 1 %   Abs Immature Granulocytes 0.04 0.00 - 0.07 K/uL    Comment: Performed at [facility], [address] [place], [place] [address]  Comprehensive metabolic  panel     Status: Abnormal   Collection Time: 09/25/21  2:42 PM  Result Value Ref Range   Sodium 138 135 - 145 mmol/L   Potassium 3.2 (L) 3.5 - 5.1 mmol/L   Chloride 103 98 - 111 mmol/L   CO2 23 22 - 32 mmol/L   Glucose, Bld 113 (H) 70 - 99 mg/dL    Comment: Glucose reference range applies only to samples taken after  fasting for at least 8 hours.   BUN 13 8 - 23 mg/dL   Creatinine, Ser 7.09 0.44 - 1.00 mg/dL   Calcium 9.4 8.9 - 62.8 mg/dL   Total Protein 7.7 6.5 - 8.1 g/dL   Albumin 4.3 3.5 - 5.0 g/dL   AST 20 15 - 41 U/L   ALT 16 0 - 44 U/L   Alkaline Phosphatase 78 38 - 126 U/L   Total Bilirubin 0.7 0.3 - 1.2 mg/dL   GFR, Estimated >36 >62 mL/min    Comment: (NOTE) Calculated using the CKD-EPI Creatinine Equation (2021)    Anion gap 12 5 - 15    Comment: Performed at [facility], [address] [place], [place] [address]  Blood gas, arterial     Status: None (Preliminary result)   Collection Time: 09/25/21  3:58 PM  Result Value Ref Range   FIO2 50 %   Delivery systems VENTILATOR    MECHVT 450 mL   PEEP 5 cm H20   pH, Arterial 7.38 7.35 - 7.45   pCO2 arterial 41 32 - 48 mmHg   pO2, Arterial 91 83 - 108 mmHg   Bicarbonate 24.3 20.0 - 28.0 mmol/L   Acid-base deficit 0.8 0.0 - 2.0 mmol/L   O2 Saturation 98.6 %   Patient temperature 37.0    Collection site REVIEWED BY    Drawn by ARTERIAL DRAW    Allens test (pass/fail) PENDING PASS   Mechanical Rate 18     Comment: Performed at [facility], [address] [place], [place] [address]  Urinalysis, Routine w reflex microscopic     Status: Abnormal   Collection Time: 09/25/21  3:59 PM  Result Value Ref Range   Color, Urine YELLOW (A) YELLOW   APPearance CLEAR (A) CLEAR   Specific Gravity, Urine 1.010 1.005 - 1.030   pH 6.0 5.0 - 8.0   Glucose, UA NEGATIVE NEGATIVE mg/dL   Hgb urine dipstick NEGATIVE NEGATIVE   Bilirubin Urine NEGATIVE NEGATIVE   Ketones, ur 5 (A) NEGATIVE mg/dL   Protein, ur  NEGATIVE NEGATIVE mg/dL   Nitrite NEGATIVE NEGATIVE   Leukocytes,Ua NEGATIVE NEGATIVE    Comment: Performed at [facility], [address], [place], [place] [address]   CT HEAD WO CONTRAST ( )  Result Date: 09/25/2021 CLINICAL DATA:  Follow-up examination for intracranial hemorrhage. EXAM: CT HEAD WITHOUT CONTRAST TECHNIQUE: Contiguous axial images were obtained from the base of the skull through the vertex without intravenous contrast. RADIATION DOSE REDUCTION: This exam was performed according to the departmental dose-optimization program which includes automated exposure control, adjustment of the mA and/or kV according to patient size and/or use of iterative reconstruction technique. COMPARISON:  Prior CT from earlier the same day. FINDINGS: Brain: Previously identified acute intraparenchymal hemorrhage centered at the right thalamus again seen, not significantly changed in size measuring 2.2 x 1.5 x 2.3 cm (estimated volume 4 mL, stable when measured in a similar fashion). Mild inferior extension towards the right cerebral peduncle. Intraventricular extension with small volume hemorrhage within the third and fourth ventricles, stable. No hydrocephalus or trapping. No other new acute intracranial hemorrhage. No acute large vessel territory infarct. Scattered hypodensity involving the supratentorial cerebral white matter noted, nonspecific, but most likely related chronic microvascular ischemic disease. No mass lesion or significant midline shift. No extra-axial fluid collection. Vascular: No hyperdense vessel. Skull: Scalp soft tissues and calvarium within normal limits. Few scattered foci of soft tissue emphysema within the left temporal region  likely related IV access. Sinuses/Orbits: Globes and orbital soft tissues within normal limits. Scattered mucosal thickening noted about the ethmoidal air cells. Small retention cysts versus polyps noted within the visualized left maxillary sinus.  Mastoid air cells are clear. Other: None. IMPRESSION: 1. No significant interval change in size of acute intraparenchymal hemorrhage centered at the right thalamus, estimated volume 4 mL. Small volume intraventricular extension with blood in the third and fourth ventricles. No hydrocephalus or trapping. 2. No other new acute intracranial abnormality. Electronically Signed   By: Jeannine Boga M.D.   On: 09/25/2021 21:26   DG Abdomen 1 View  Result Date: 09/25/2021 CLINICAL DATA:  Placement of NG tube EXAM: ABDOMEN - 1 VIEW COMPARISON:  None Available. FINDINGS: Distal portion of NG tube is coiled within the fundus of the stomach with its tip pointing coracoid in the region of body of the stomach. Visualized bowel gas pattern is unremarkable. There are linear densities in the lower lung fields suggesting scarring or subsegmental atelectasis. IMPRESSION: Tip of NG tube is seen in the stomach. Electronically Signed   By: Elmer Picker M.D.   On: 09/25/2021 15:50   DG Chest 1 View  Result Date: 09/25/2021 CLINICAL DATA:  Provided history: Intubation. Status post intubation and OG tube placement. EXAM: CHEST  1 VIEW COMPARISON:  Concurrently performed abdominal radiograph 09/25/2021. FINDINGS: ET tube present with tip terminating 2.2 cm above the level of the carina. An enteric tube passes below level of left hemidiaphragm and is coiled within the stomach. Heart size within normal limits. Aortic atherosclerosis. Mild bibasilar atelectasis. No appreciable airspace consolidation. No evidence of pleural effusion or pneumothorax. No acute bony abnormality identified. IMPRESSION: Support apparatus, as described. Mild bibasilar atelectasis. Aortic Atherosclerosis (ICD10-I70.0). Electronically Signed   By: Kellie Simmering D.O.   On: 09/25/2021 15:47   CT HEAD CODE STROKE WO CONTRAST  Result Date: 09/25/2021 CLINICAL DATA:  Code stroke.  Stroke suspected, no history given EXAM: CT HEAD WITHOUT CONTRAST  TECHNIQUE: Contiguous axial images were obtained from the base of the skull through the vertex without intravenous contrast. RADIATION DOSE REDUCTION: This exam was performed according to the departmental dose-optimization program which includes automated exposure control, adjustment of the mA and/or kV according to patient size and/or use of iterative reconstruction technique. COMPARISON:  None Available. FINDINGS: Brain: Hemorrhage centered in the right thalamus and basal ganglia, which measures approximally 1.3 x 2.8 x 2.2 cm (AP x TR x CC) (series 4, image 13 and series 6, image 24). Surrounding hypodensity, likely mild edema. Hemorrhage is also noted in the third ventricle, cerebral aqueduct, and fourth ventricle. Mild local mass effect 1-2 mm of right-to-left midline shift midline shift. No evidence of acute cortical infarct, hydrocephalus, or extra-axial collection. Vascular: No hyperdense vessel. Skull: No acute osseous abnormality. Sinuses/Orbits: No acute finding. Other: The mastoids are well aerated. IMPRESSION: Hemorrhage centered in the right thalamus and basal ganglia, with mild surrounding edema, minimal midline shift, and a small amount of intraventricular extension into the third and fourth ventricles. Code stroke imaging results were communicated on 09/25/2021 at 2:53 pm to provider BHAGAT via secure text paging. Electronically Signed   By: Merilyn Baba M.D.   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 Reversal agent was administered at OSH prior to transfer to [facility].  -  Exam reveals findings consistent with her right thalamic hemorrhage. - Imaging: - CT head at 1456: Hemorrhage centered in the right thalamus and basal ganglia, with mild surrounding edema, minimal midline shift, and a small amount of intraventricular  extension into the third and fourth ventricles. - Repeat CT head at 2126: No significant interval change in size of acute intraparenchymal hemorrhage centered at the right thalamus, estimated volume 4 mL. Small volume intraventricular extension with blood in the third and fourth ventricles. No hydrocephalus or trapping. No other new acute intracranial abnormality. - Notably patient has a listed allergy and outside records to CT contrast.  This is listed as diarrhea and a low severity allergy however daughter at bedside reports that the patient has a severe reaction to some kind of contrast and she is not sure of the details.  Given clinical stability and index of suspicion that this is hypertensive hemorrhage without underlying vascular malformation, will continue to hold off on CTA for now.  - The only allergy patient reported to Teleneurology prior to intubation was codeine ("all forms of codeine"), to which she reported she has nausea, rash and throat closing, but again this was in the setting of acute neurological decompensation   Plan: # Hemorrhagic stroke, most likely hypertensive, possibly underlying mass, less likely hemorrhagic conversion of ischemic stroke  - Stroke labs HgbA1c, fasting lipid panel - MRI brain w/ and w/o when stabilized to eval for underlying mass, with MRA head and neck (will need to be ordered) - Stability scan in 6 hours (head CT ordered) - Frequent neuro checks, q1hr  - Echocardiogram - No antiplatelet medications or anticoagulants due to [place] - Reversed with Andexxa initially using 900 mg due to patient reporting taking Eliquis at 9 AM. Later an additional dose of 900 mg was given due to Cardiology notes stating that patient was started on Xarelto (she may have misreported which agent she was on in the setting of her acute neurological condition); this was discussed with Pharmacy - She did receive a 250 cc bolus of 3%, was started on 50 mL/h due to examination findings  concerning for impending herniation just prior to intubation (nystagmus, left pupil larger than right pupil) - Every 6 hour sodium checks - Goal euglycemia, every 4 hours glucose checks with low-dose sliding scale insulin ordered - DVT PPx heparin at 24 hrs if stable, SCDs for now - Risk factor modification - Telemetry monitoring; 30 day event monitor on discharge if no arrhythmias captured  - Blood pressure goal SBP < 140  - PT consult, OT consult, Speech consult when patient stabilized  - Long-term, estrogen use would increase the risk of stroke. Would recommend discontinuing Estrace at discharge if she is indeed on this medication - Daughter at bedside updated at 7:30 PM, she confirms patient is full CODE STATUS and if the patient declines she feels that the patient would want consideration for intraventricular drain placement - Will need emergent Neurosurgical consult if there is clinical worsening or interval worsening on repeat CT head. Dr. Curly Shores has discussed with family that she is at high risk for needing drain placement - CCM has been consulted for vent management  50 minutes spent in the neurological evaluation and management of this critically ill patient  Electronically signed: Dr. Kerney Elbe 09/25/2021, 10:24 PM

## 2021-09-25 NOTE — ED Notes (Signed)
Neuro at CT.

## 2021-09-25 NOTE — ED Notes (Signed)
Report attempted to [facility] at this time.

## 2021-09-25 NOTE — Consult Note (Incomplete)
Neurology Consultation Reason for Consult: Code stroke Requesting Physician: Rada Hay  CC: Left sided weakness, headache   History is obtained from: Patient, chart review   HPI: Charlotte Hunter is a 66 y.o. female with a PMHx of HTN and Afib on eliquis (recently s/p ablation)   LKW: *** tPA given?: No, or if yes, time given *** IA performed?: No, or if yes, groin puncture time: *** Premorbid modified rankin scale: ***     0 - No symptoms.     1 - No significant disability. Able to carry out all usual activities, despite some symptoms.     2 - Slight disability. Able to look after own affairs without assistance, but unable to carry out all previous activities.     3 - Moderate disability. Requires some help, but able to walk unassisted.     4 - Moderately severe disability. Unable to attend to own bodily needs without assistance, and unable to walk unassisted.     5 - Severe disability. Requires constant nursing care and attention, bedridden, incontinent.     6 - Dead. ICH Score: ***  Time performed: *** GCS: {Blank single:19197::"3-4 is 2 points","5-12 is 1 point","13-15 is 0 points"} Infratentorial: {yes no:314532}. If yes, 1 point Volume: {Blank single:19197::">30cc is 1 point","<30cc is 0 points"}  Age: 66 y.o.. >80 is 1 point Intraventricular extension is 1 point  Score:***  A Score of {Blank single:19197::"0 points has a 30 day mortality of 0%","1 points has a 30 day mortality of 13%","2 points has a 30 day mortality of 26%","3 points has a 30 day mortality of 72%","4 points has a 30 day mortality of 97%","5-6 points has a 30 day mortality of 100%"}. Stroke. 2001 Apr;32(4):891-7.    ROS: All other review of systems was negative except as noted in the HPI. *** Unable to obtain due to altered mental status.   No past medical history on file. ***  No family history on file. ***  Social History:  has no history on file for tobacco use, alcohol use, and drug  use. ***  Exam: Current vital signs: BP (!) 153/71   Pulse 85   Resp (!) 22   Wt 90.8 kg   SpO2 92%  Vital signs in last 24 hours: Pulse Rate:  [85] 85 (06/13 1500) Resp:  [22] 22 (06/13 1500) BP: (153)/(71) 153/71 (06/13 1500) SpO2:  [92 %] 92 % (06/13 1500) Weight:  [90.8 kg] 90.8 kg (06/13 1504)   Physical Exam  Constitutional: Appears well-developed and well-nourished.  Psych: Affect appropriate to situation, *** Eyes: No scleral injection HENT: No oropharyngeal obstruction.  MSK: no joint deformities.  Cardiovascular: Normal rate and regular rhythm. *** Perfusing extremities well Respiratory: Effort normal, non-labored breathing GI: Soft.  No distension. There is no tenderness.  Skin: Warm dry and intact visible skin  Neuro: Mental Status: Patient is awake, alert, oriented to person, place, month, year, and situation.*** Patient is able to give a clear and coherent history.*** No signs of aphasia or neglect*** Cranial Nerves: II: Visual Fields are full. Pupils are equal, round, and reactive to light.  *** III,IV, VI: EOMI without ptosis or diploplia.  V: Facial sensation is symmetric to temperature VII: Facial movement is symmetric.  VIII: hearing is intact to voice X: Uvula elevates symmetrically XI: Shoulder shrug is symmetric. XII: tongue is midline without atrophy or fasciculations.  Motor: Tone is normal. Bulk is normal. 5/5 strength was present in all four extremities. *** Sensory: Sensation  is symmetric to light touch and temperature in the arms and legs.*** Deep Tendon Reflexes: 2+ and symmetric in the brachioradialis and patellae. *** Plantars: Toes are downgoing bilaterally. *** Cerebellar: FNF and HKS are intact bilaterally*** Gait:  Deferred in acute setting ***  NIHSS total *** Score breakdown: *** Performed at *** time of patient arrival to ED    I have reviewed labs in epic and the results pertinent to this consultation are: ***  I  have reviewed the images obtained:***   Impression: ***  Recommendations: - ***   Lesleigh Noe MD-PhD [facility] [phone]   *** [facility], [facility], Teleneuro    Total critical care time: *** minutes   Critical care time was exclusive of separately billable procedures and treating other patients.   Critical care was necessary to treat or prevent imminent or life-threatening deterioration.   Critical care was time spent personally by me on the following activities: development of treatment plan with patient and/or surrogate as well as nursing, discussions with consultants/primary team, evaluation of patient's response to treatment, examination of patient, obtaining history from patient or surrogate, ordering and performing treatments and interventions, ordering and review of laboratory studies, ordering and review of radiographic studies, and re-evaluation of patient's condition as needed, as documented above.

## 2021-09-25 NOTE — ED Notes (Signed)
Pt placed on bair hugger at this time per Dr. Sidney Ace. Temp sensing foley reading low. Checked rectal temp for verification, 96.7 degrees F rectally.

## 2021-09-26 ENCOUNTER — Encounter (HOSPITAL_COMMUNITY): Payer: Self-pay | Admitting: Neurology

## 2021-09-26 ENCOUNTER — Inpatient Hospital Stay (HOSPITAL_COMMUNITY): Payer: Medicare Other

## 2021-09-26 ENCOUNTER — Other Ambulatory Visit: Payer: Self-pay

## 2021-09-26 DIAGNOSIS — I610 Nontraumatic intracerebral hemorrhage in hemisphere, subcortical: Secondary | ICD-10-CM

## 2021-09-26 DIAGNOSIS — Z9911 Dependence on respirator [ventilator] status: Secondary | ICD-10-CM

## 2021-09-26 DIAGNOSIS — I618 Other nontraumatic intracerebral hemorrhage: Secondary | ICD-10-CM | POA: Diagnosis not present

## 2021-09-26 DIAGNOSIS — I615 Nontraumatic intracerebral hemorrhage, intraventricular: Secondary | ICD-10-CM | POA: Diagnosis not present

## 2021-09-26 DIAGNOSIS — I6389 Other cerebral infarction: Secondary | ICD-10-CM

## 2021-09-26 DIAGNOSIS — J9601 Acute respiratory failure with hypoxia: Secondary | ICD-10-CM

## 2021-09-26 DIAGNOSIS — Z7901 Long term (current) use of anticoagulants: Secondary | ICD-10-CM

## 2021-09-26 DIAGNOSIS — I480 Paroxysmal atrial fibrillation: Secondary | ICD-10-CM

## 2021-09-26 DIAGNOSIS — I161 Hypertensive emergency: Secondary | ICD-10-CM | POA: Diagnosis not present

## 2021-09-26 LAB — HEMOGLOBIN A1C
Hgb A1c MFr Bld: 5.9 % — ABNORMAL HIGH (ref 4.8–5.6)
Mean Plasma Glucose: 122.63 mg/dL

## 2021-09-26 LAB — BASIC METABOLIC PANEL
Anion gap: 7 (ref 5–15)
BUN: 13 mg/dL (ref 8–23)
CO2: 21 mmol/L — ABNORMAL LOW (ref 22–32)
Calcium: 8.4 mg/dL — ABNORMAL LOW (ref 8.9–10.3)
Chloride: 117 mmol/L — ABNORMAL HIGH (ref 98–111)
Creatinine, Ser: 0.64 mg/dL (ref 0.44–1.00)
GFR, Estimated: >60 mL/min (ref >60)
Glucose, Bld: 111 mg/dL — ABNORMAL HIGH (ref 70–99)
Potassium: 3.9 mmol/L (ref 3.5–5.1)
Sodium: 145 mmol/L (ref 135–145)

## 2021-09-26 LAB — TRIGLYCERIDES: Triglycerides: 153 mg/dL — ABNORMAL HIGH (ref <150)

## 2021-09-26 LAB — ECHOCARDIOGRAM COMPLETE
AR max vel: 3.18 cm2
AV Area VTI: 2.81 cm2
AV Area mean vel: 2.86 cm2
AV Mean grad: 5 mmHg
AV Peak grad: 9.4 mmHg
Ao pk vel: 1.53 m/s
Area-P 1/2: 4.26 cm2
P 1/2 time: 683 msec
S' Lateral: 2.8 cm
Weight: 2895.96 oz

## 2021-09-26 LAB — HIV ANTIBODY (ROUTINE TESTING W REFLEX): HIV Screen 4th Generation wRfx: NONREACTIVE

## 2021-09-26 LAB — SODIUM: Sodium: 144 mmol/L (ref 135–145)

## 2021-09-26 LAB — MRSA NEXT GEN BY PCR, NASAL: MRSA by PCR Next Gen: NOT DETECTED

## 2021-09-26 IMAGING — DX DG PORTABLE PELVIS
1 series · 1 of 1 positions shown · non-contrast
Comparison: None Available.

CLINICAL DATA: MRI screening, evaluate for metal.

EXAM:
PORTABLE PELVIS 1-2 VIEWS

[pelvis]
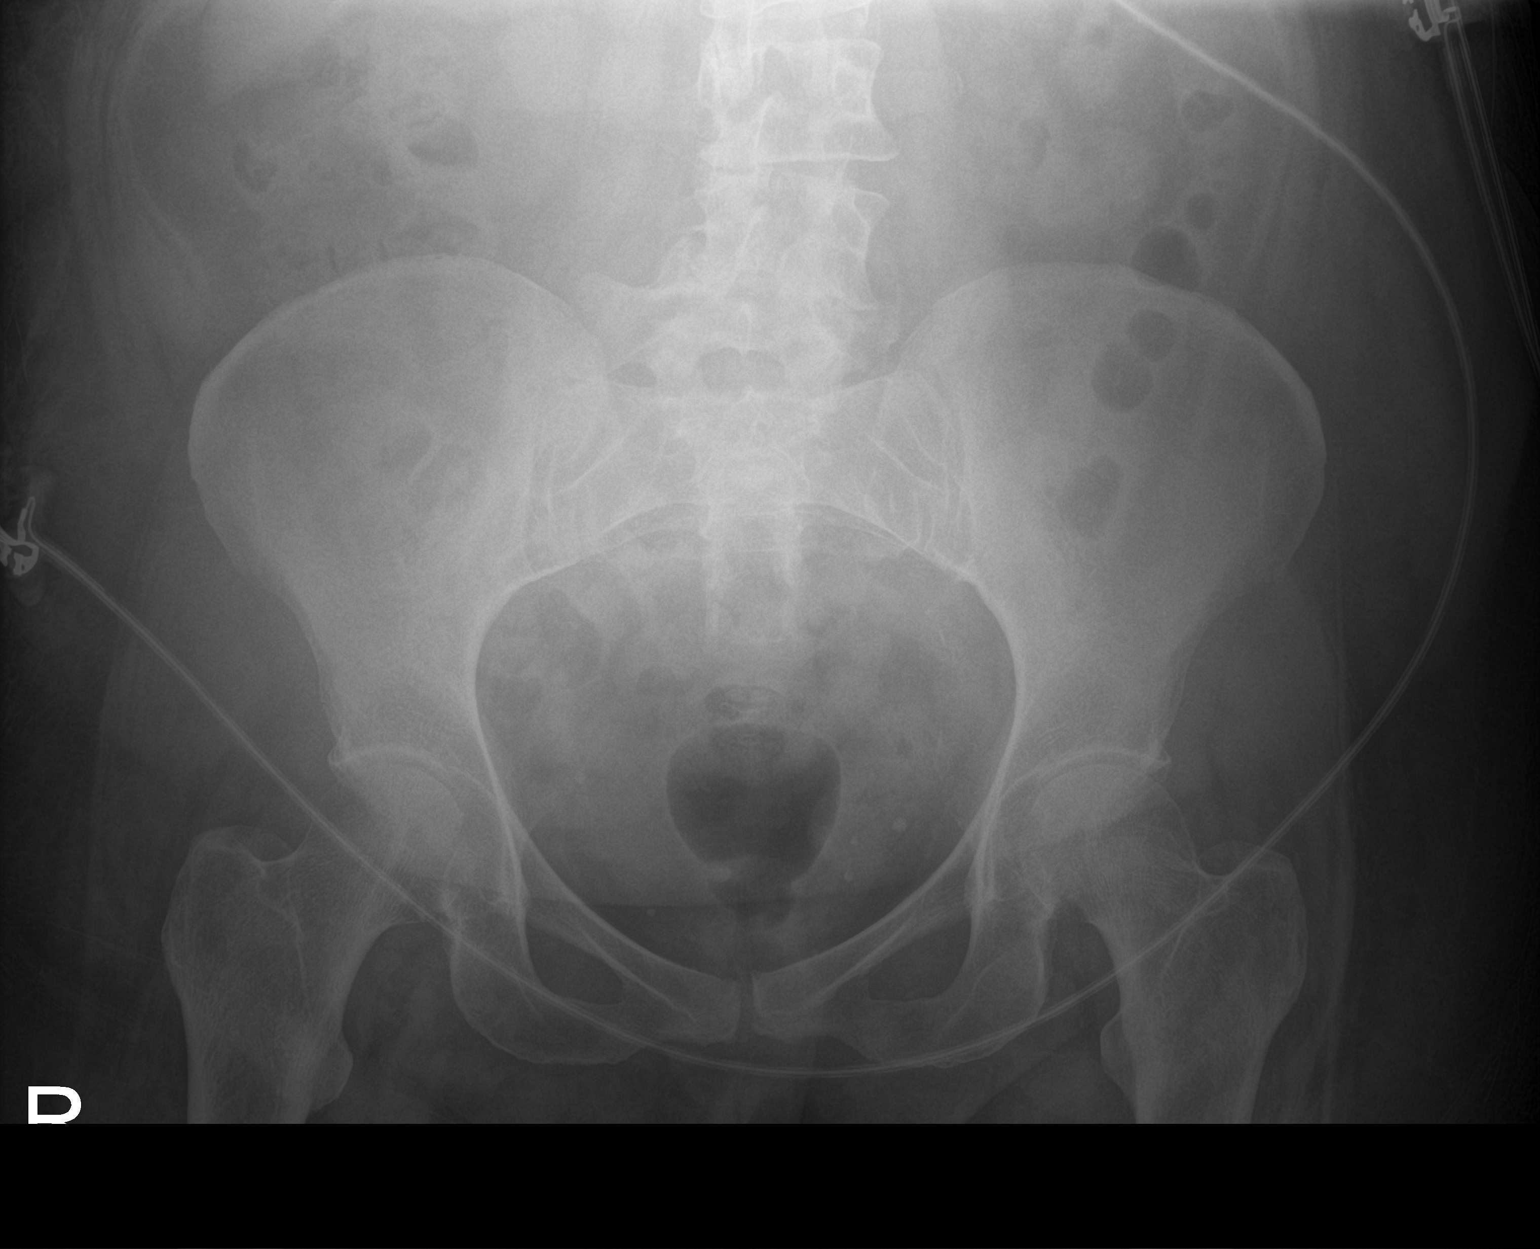

[1 of 1 positions shown; findings below may reference images not displayed]

FINDINGS: There is no evidence for metallic foreign body. Phleboliths are
noted in the pelvis. No dilated bowel loops. No acute fractures.
IMPRESSION: No evidence for metallic foreign body in the pelvis or lower
abdomen.

## 2021-09-26 MED ORDER — ONDANSETRON HCL 4 MG/2ML IJ SOLN
4.0000 mg | Freq: Four times a day (QID) | INTRAMUSCULAR | Status: DC | PRN
  Administered 2021-09-26 – 2021-09-28 (×4): 4 mg via INTRAVENOUS
  Filled 2021-09-26 (×4): qty 2

## 2021-09-26 MED ORDER — PANTOPRAZOLE 2 MG/ML SUSPENSION
40.0000 mg | Freq: Every day | ORAL | Status: DC
  Administered 2021-09-26: 40 mg
  Filled 2021-09-26: qty 20

## 2021-09-26 MED ORDER — SODIUM CHLORIDE 0.9 % IV SOLN: INTRAVENOUS | Status: DC

## 2021-09-26 MED ORDER — METOPROLOL TARTRATE 25 MG PO TABS
25.0000 mg | ORAL_TABLET | Freq: Two times a day (BID) | ORAL | Status: DC
  Administered 2021-09-26: 25 mg
  Filled 2021-09-26 (×2): qty 1

## 2021-09-26 MED ORDER — LOSARTAN POTASSIUM 50 MG PO TABS
50.0000 mg | ORAL_TABLET | Freq: Every day | ORAL | Status: DC
Start: 2021-09-26 — End: 2021-09-27
  Administered 2021-09-26 – 2021-09-27 (×2): 50 mg
  Filled 2021-09-26 (×2): qty 1

## 2021-09-26 MED ORDER — LABETALOL HCL 5 MG/ML IV SOLN: 10.0000 mg | INTRAVENOUS | Status: DC | PRN

## 2021-09-26 MED ORDER — LABETALOL HCL 5 MG/ML IV SOLN
10.0000 mg | INTRAVENOUS | Status: DC | PRN
Start: 2021-09-26 — End: 2021-09-26

## 2021-09-26 NOTE — Progress Notes (Signed)
STROKE TEAM PROGRESS NOTE   SUBJECTIVE (INTERVAL HISTORY) Her daughter is at the bedside.  Overall her condition is gradually improving.  Patient still intubated, however open eyes on voice, follows simple commands.  Pending to extubation today if possible.  Pending MRI and MRA.   OBJECTIVE Temp:  [81.3 F (27.4 C)-99.1 F (37.3 C)] 99.1 F (37.3 C) (06/14 1200) Pulse Rate:  [58-100] 72 (06/14 1200) Resp:  [15-23] 15 (06/14 1200) BP: (85-153)/(45-74) 149/71 (06/14 1200) SpO2:  [92 %-100 %] 95 % (06/14 1200) FiO2 (%):  [40 %-50 %] 40 % (06/14 1145) Weight:  [82.1 kg-90.8 kg] 82.1 kg (06/13 2340)  Recent Labs  Lab 09/25/21 1439  GLUCAP 108*   Recent Labs  Lab 09/25/21 1442 09/25/21 2236 09/25/21 2321 09/26/21 0424 09/26/21 1001  NA 138 141 143 144 145  K 3.2*  --  4.1  --  3.9  CL 103  --   --   --  117*  CO2 23  --   --   --  21*  GLUCOSE 113*  --   --   --  111*  BUN 13  --   --   --  13  CREATININE 0.66  --   --   --  0.64  CALCIUM 9.4  --   --   --  8.4*   Recent Labs  Lab 09/25/21 1442  AST 20  ALT 16  ALKPHOS 78  BILITOT 0.7  PROT 7.7  ALBUMIN 4.3   Recent Labs  Lab 09/25/21 1442 09/25/21 2321  WBC 8.8  --   NEUTROABS 4.7  --   HGB 12.0 9.9*  HCT 37.4 29.0*  MCV 91.4  --   PLT 273  --    No results for input(s): "CKTOTAL", "CKMB", "CKMBINDEX", "TROPONINI" in the last 168 hours. Recent Labs    09/25/21 1442  LABPROT 14.8  INR 1.2   Recent Labs    09/25/21 1559  COLORURINE YELLOW*  LABSPEC 1.010  PHURINE 6.0  GLUCOSEU NEGATIVE  HGBUR NEGATIVE  BILIRUBINUR NEGATIVE  KETONESUR 5*  PROTEINUR NEGATIVE  NITRITE NEGATIVE  LEUKOCYTESUR NEGATIVE       Component Value Date/Time   TRIG 153 (H) 09/26/2021 0424   Lab Results  Component Value Date   HGBA1C 5.9 (H) 09/25/2021   No results found for: "LABOPIA", "COCAINSCRNUR", "LABBENZ", "AMPHETMU", "THCU", "LABBARB"  No results for input(s): "ETH" in the last 168 hours.  I have  personally reviewed the radiological images below and agree with the radiology interpretations.  DG Abd 1 View  Result Date: 09/25/2021 CLINICAL DATA:  OG tube placement EXAM: ABDOMEN - 1 VIEW COMPARISON:  09/25/2021 FINDINGS: Esophageal tube tip overlies the stomach, side port probably in the region of GE junction. Possible dilated segment of small bowel in the central abdomen. There is gas present in the colon IMPRESSION: Esophageal tube tip overlies the stomach, side-port in the region of GE junction, further advancement suggested for more optimal positioning Electronically Signed   By: Donavan Foil M.D.   On: 09/25/2021 23:27   CT HEAD WO CONTRAST (5MM)  Result Date: 09/25/2021 CLINICAL DATA:  Follow-up examination for intracranial hemorrhage. EXAM: CT HEAD WITHOUT CONTRAST TECHNIQUE: Contiguous axial images were obtained from the base of the skull through the vertex without intravenous contrast. RADIATION DOSE REDUCTION: This exam was performed according to the departmental dose-optimization program which includes automated exposure control, adjustment of the mA and/or kV according to patient size and/or use of  iterative reconstruction technique. COMPARISON:  Prior CT from earlier the same day. FINDINGS: Brain: Previously identified acute intraparenchymal hemorrhage centered at the right thalamus again seen, not significantly changed in size measuring 2.2 x 1.5 x 2.3 cm (estimated volume 4 mL, stable when measured in a similar fashion). Mild inferior extension towards the right cerebral peduncle. Intraventricular extension with small volume hemorrhage within the third and fourth ventricles, stable. No hydrocephalus or trapping. No other new acute intracranial hemorrhage. No acute large vessel territory infarct. Scattered hypodensity involving the supratentorial cerebral white matter noted, nonspecific, but most likely related chronic microvascular ischemic disease. No mass lesion or significant midline  shift. No extra-axial fluid collection. Vascular: No hyperdense vessel. Skull: Scalp soft tissues and calvarium within normal limits. Few scattered foci of soft tissue emphysema within the left temporal region likely related IV access. Sinuses/Orbits: Globes and orbital soft tissues within normal limits. Scattered mucosal thickening noted about the ethmoidal air cells. Small retention cysts versus polyps noted within the visualized left maxillary sinus. Mastoid air cells are clear. Other: None. IMPRESSION: 1. No significant interval change in size of acute intraparenchymal hemorrhage centered at the right thalamus, estimated volume 4 mL. Small volume intraventricular extension with blood in the third and fourth ventricles. No hydrocephalus or trapping. 2. No other new acute intracranial abnormality. Electronically Signed   By: Rise Mu M.D.   On: 09/25/2021 21:26   DG Abdomen 1 View  Result Date: 09/25/2021 CLINICAL DATA:  Placement of NG tube EXAM: ABDOMEN - 1 VIEW COMPARISON:  None Available. FINDINGS: Distal portion of NG tube is coiled within the fundus of the stomach with its tip pointing coracoid in the region of body of the stomach. Visualized bowel gas pattern is unremarkable. There are linear densities in the lower lung fields suggesting scarring or subsegmental atelectasis. IMPRESSION: Tip of NG tube is seen in the stomach. Electronically Signed   By: Ernie Avena M.D.   On: 09/25/2021 15:50   DG Chest 1 View  Result Date: 09/25/2021 CLINICAL DATA:  Provided history: Intubation. Status post intubation and OG tube placement. EXAM: CHEST  1 VIEW COMPARISON:  Concurrently performed abdominal radiograph 09/25/2021. FINDINGS: ET tube present with tip terminating 2.2 cm above the level of the carina. An enteric tube passes below level of left hemidiaphragm and is coiled within the stomach. Heart size within normal limits. Aortic atherosclerosis. Mild bibasilar atelectasis. No  appreciable airspace consolidation. No evidence of pleural effusion or pneumothorax. No acute bony abnormality identified. IMPRESSION: Support apparatus, as described. Mild bibasilar atelectasis. Aortic Atherosclerosis (ICD10-I70.0). Electronically Signed   By: Jackey Loge D.O.   On: 09/25/2021 15:47   CT HEAD CODE STROKE WO CONTRAST  Result Date: 09/25/2021 CLINICAL DATA:  Code stroke.  Stroke suspected, no history given EXAM: CT HEAD WITHOUT CONTRAST TECHNIQUE: Contiguous axial images were obtained from the base of the skull through the vertex without intravenous contrast. RADIATION DOSE REDUCTION: This exam was performed according to the departmental dose-optimization program which includes automated exposure control, adjustment of the mA and/or kV according to patient size and/or use of iterative reconstruction technique. COMPARISON:  None Available. FINDINGS: Brain: Hemorrhage centered in the right thalamus and basal ganglia, which measures approximally 1.3 x 2.8 x 2.2 cm (AP x TR x CC) (series 4, image 13 and series 6, image 24). Surrounding hypodensity, likely mild edema. Hemorrhage is also noted in the third ventricle, cerebral aqueduct, and fourth ventricle. Mild local mass effect 1-2 mm of right-to-left midline shift  midline shift. No evidence of acute cortical infarct, hydrocephalus, or extra-axial collection. Vascular: No hyperdense vessel. Skull: No acute osseous abnormality. Sinuses/Orbits: No acute finding. Other: The mastoids are well aerated. IMPRESSION: Hemorrhage centered in the right thalamus and basal ganglia, with mild surrounding edema, minimal midline shift, and a small amount of intraventricular extension into the third and fourth ventricles. Code stroke imaging results were communicated on 09/25/2021 at 2:53 pm to provider BHAGAT via secure text paging. Electronically Signed   By: Merilyn Baba M.D.   On: 09/25/2021 14:56     PHYSICAL EXAM  Temp:  [81.3 F (27.4 C)-99.1 F (37.3  C)] 99.1 F (37.3 C) (06/14 1200) Pulse Rate:  [58-100] 72 (06/14 1200) Resp:  [15-23] 15 (06/14 1200) BP: (85-153)/(45-74) 149/71 (06/14 1200) SpO2:  [92 %-100 %] 95 % (06/14 1200) FiO2 (%):  [40 %-50 %] 40 % (06/14 1145) Weight:  [82.1 kg-90.8 kg] 82.1 kg (06/13 2340)  General - Well nourished, well developed, intubated on low-dose sedation.  Ophthalmologic - fundi not visualized due to noncooperation.  Cardiovascular - Regular rate and rhythm, not in A-fib.  Neuro - intubated on low-dose propofol and fentanyl, eyes open on voice, following all simple commands. Eyes in need position, visual field full, left eye abduction incomplete, bilateral tracking, PERRL. Corneal reflex present, gag and cough present. Breathing over the vent.  Facial symmetry not able to test due to ET tube.  Tongue protrusion not cooperative. On pain stimulation, RUE and RLE at least 4+/5, however, left upper and lower extremity flaccid. DTR 1+ and no babinski. Sensation decreased on the left, coordination intact on the right FTN and gait not tested.   ASSESSMENT/PLAN Ms. Hydia Beyah is a 65 y.o. female with history of hypertension, PAF on Eliquis admitted for left-sided weakness. No tPA given due to [place].    ICH:  right thalamic ICH with small IVH, likely secondary to hypertension in the setting of Eliquis use CT head ICH centered in the right thalamus in the basal ganglia, minimal midline shift, small amount of IVH CT repeat stable ICH and IVH MRI pending MRA pending Carotid Doppler pending 2D Echo EF 65 to 70% LDL pending HgbA1c 5.9 SCDs for VTE prophylaxis Eliquis (apixaban) daily prior to admission, now on No antithrombotic.  Ongoing aggressive stroke risk factor management Therapy recommendations: Pending Disposition: Pending  Respiratory failure Intubated for airway protection On weaning Plan to extubate today if possible  PAF On Eliquis Was switched to Xarelto but not started  yet Reversed with Andexxa Rate controlled currently Not on antithrombotics now  Hypertension Home meds losartan, metoprolol XR 50 Stable Off Cleviprex now On home losartan 50, metoprolol 25 twice daily BP goal less than 160 Long term BP goal normotensive  Hyperlipidemia Home meds: None LDL pending, goal < 70  Other Stroke Risk Factors Advanced age  Other Active Problems   Hospital day # 1  This patient is critically ill due to [place] with IVH, hypertensive encephalopathy, respiratory failure and at significant risk of neurological worsening, death form hematoma expansion, brain edema, herniation. This patient's care requires constant monitoring of vital signs, hemodynamics, respiratory and cardiac monitoring, review of multiple databases, neurological assessment, discussion with family, other specialists and medical decision making of high complexity. I spent 35 minutes of neurocritical care time in the care of this patient. I had long discussion with daughter at bedside, updated pt current condition, treatment plan and potential prognosis, and answered all the questions.  She expressed understanding and  appreciation.    Rosalin Hawking, MD PhD Stroke Neurology 09/26/2021 12:29 PM    To contact Stroke Continuity provider, please refer to [email or web address] After hours, contact General Neurology

## 2021-09-26 NOTE — Progress Notes (Addendum)
100 cc of urine output since arrival onto unit, bladder scan shows 68mL, CCM at bedside and aware. No new orders received, will continue to monitor.

## 2021-09-26 NOTE — Progress Notes (Signed)
SLP Cancellation Note  Patient Details Name: Charlotte Hunter MRN: [id number] DOB: [birth year]   Cancelled treatment:       Reason Eval/Treat Not Completed: Patient not medically ready (Pt is currently on the vent. SLP will follow up.)  Neomia Herbel I. Hardin Negus, [place], [place] Office number [phone] Pager [place] 09/26/2021, 8:13 AM

## 2021-09-26 NOTE — Consult Note (Signed)
NAME:  Charlotte Hunter, MRN:  [id number], DOB:  1955/07/13, LOS: 1 ADMISSION DATE:  09/25/2021 CONSULTATION DATE:  09/26/2021 REFERRING MD:  Cheral Marker - Neuro CHIEF COMPLAINT:  ICH, vent management   History of Present Illness:  66 year old woman who presented to [facility] 6/13 as a Code Stroke with left-sided weakness, HA, nausea. LKN 1400 6/13. NIHSS 13. 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  Patient presented to [facility] with L-sided weakness and HA. STAT CT Head demonstrated acute intraparenchymal hemorrhage centered at the right thalamus (estimated volume 75mL) small volume intraventricular extension with blood in the third and fourth ventricles, no hydrocephalus. Mild mass effect of 1-89mm right to left midline shift. Not a candidate for TNK, given ICH. Andexxa reversal agent administered in the setting of home Xarelto for PAF. Intubated in ED for airway protection. Per chart review, following commands on sedation post-intubation. Cleviprex gtt was initiated for BP management. HTS 3% started per Neuro. Transferred to [facility] for a higher level of care.  PCCM consulted for vent/BP management.  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 LKN 1400. Code Stroke called. CT Head with R thalamic ICH, small amount of mass effect/midline shift. Neuro consulted. Andexxa administered. 6/14 - Transferred from [facility] to [facility]. Repeat CT Head stable. HTS 3% started. PCCM consulted for vent/BP management.  Interim History / Subjective:  PCCM consulted for vent/BP management  Objective:  Blood pressure (!) 97/58,  pulse 61, temperature (!) 97.3 F (36.3 C), temperature source Bladder, resp. rate 18, weight 82.1 kg, SpO2 100 %.    Vent Mode: PRVC FiO2 (%):  [40 %-50 %] 40 % Set Rate:  [18 bmp] 18 bmp Vt Set:  [450 mL-550 mL] 550 mL PEEP:  [5 cmH20] 5 cmH20 Plateau Pressure:  [15 cmH20] 15 cmH20   Intake/Output Summary (Last 24 hours) at 09/26/2021 0104 Last data filed at 09/26/2021 0000 Gross per 24 hour  Intake 29.3 ml  Output 30 ml  Net -0.7 ml   Filed Weights   09/25/21 2340  Weight: 82.1 kg   Physical Examination: General: Well-developed, well-nourished middle-aged woman in NAD. Intubated, mildly sedated. HEENT: /AT, anicteric sclera, PERRL 29mm, moist mucous membranes. ETT/OGT in place. Neuro:  Awake, lightly sedated. Nodding appropriately to questions.  Responds to verbal stimuli. Following commands consistently. Unilateral L-sided neglect noted. Strength 5/5 in RUE/RLE, strength 1/4 in LUE/LLE. Withdraws to pain in LLE, nothing noted to LUE. +Corneal, +Cough, and +Gag  CV: RRR, no m/g/r. PULM: Breathing even and unlabored on vent (PEEP 5, FiO2 40%). Lung fields CTAB. GI: Soft, nontender, nondistended. Normoactive bowel sounds. Extremities: No LE edema noted. Skin: Warm/dry, no rashes.  Resolved Hospital Problem List:    Assessment & Plan:  Acute R thalamic ICH, likely hypertensive CT Head demonstrated acute intraparenchymal hemorrhage centered at the right thalamus (estimated volume 18mL) small volume intraventricular extension with blood in the third and fourth ventricles, no hydrocephalus. Mild mass effect of 1-19mm right to left midline shift. Possibility of mass not ruled out, less likely HT of ischemic CVA per Neuro.  - Neurology/Stroke primary - SBP goal < 140 - Cleviprex gtt titrated to SBP goal as needed - F/u  Echo - F/u A1c, lipid panel - Frequent neuro checks - Hypertonic saline 3% per Neuro, Na Q6H - Low threshold for 23.4% push if c/f worsening neurologic  status/herniation - Neuroprotective measures: HOB > 30 degrees, normoglycemia, normothermia, electrolytes WNL  Respiratory insufficiency in the setting of ICH - Continue full vent support (4-8cc/kg IBW) - Wean FiO2 for O2 sat > 90% - Daily WUA/SBT, may be able to extubate 6/14 pending mental status - VAP bundle - Pulmonary hygiene - PAD protocol for sedation: Propofol and Fentanyl for goal RASS 0 to -1  Paroxysmal AF Ectopic atrial rhythm S/p ablation. Previously on Eliquis with financial difficulty, per Cardiology note 6/6 planning to transition to Xarelto if more financially feasible; unclear if this transition occurred. Per chart review, med "dispensed" by [facility] in [place] ([place] uncertain if patient picked up/started this medication. Also possibility that patient was given a sample at [facility] clinic. - Andexxa administered in the setting of ICH - Hold any further home [facility] - Will need ongoing discussion re: risk/benefit of resuming AC - Cardiac monitoring - Consider resumption of home metoprolol, propafenone once clinically stable  Hypertension - Cleviprex for now, titrated to goal SBP < 140 - Resume home metoprolol, losartan as clinically appropriate  Best Practice: (right click and "Reselect all SmartList Selections" daily)   Per Primary Team  Labs:  CBC: Recent Labs  Lab 09/25/21 1442 09/25/21 2321  WBC 8.8  --   NEUTROABS 4.7  --   HGB 12.0 9.9*  HCT 37.4 29.0*  MCV 91.4  --   PLT 273  --    Basic Metabolic Panel: Recent Labs  Lab 09/25/21 1442 09/25/21 2236 09/25/21 2321  NA 138 141 143  K 3.2*  --  4.1  CL 103  --   --   CO2 23  --   --   GLUCOSE 113*  --   --   BUN 13  --   --   CREATININE 0.66  --   --   CALCIUM 9.4  --   --    GFR: CrCl cannot be calculated (Unknown ideal weight.). Recent Labs  Lab 09/25/21 1442  WBC 8.8   Liver Function Tests: Recent Labs  Lab 09/25/21 1442  AST 20  ALT 16  ALKPHOS 78   BILITOT 0.7  PROT 7.7  ALBUMIN 4.3   No results for input(s): "LIPASE", "AMYLASE" in the last 168 hours. No results for input(s): "AMMONIA" in the last 168 hours.  ABG:    Component Value Date/Time   PHART 7.373 09/25/2021 2321   PCO2ART 38.3 09/25/2021 2321   PO2ART 99 09/25/2021 2321   HCO3 22.5 09/25/2021 2321   TCO2 24 09/25/2021 2321   ACIDBASEDEF 3.0 (H) 09/25/2021 2321   O2SAT 98 09/25/2021 2321    Coagulation Profile: Recent Labs  Lab 09/25/21 1442  INR 1.2   Cardiac Enzymes: No results for input(s): "CKTOTAL", "CKMB", "CKMBINDEX", "TROPONINI" in the last 168 hours.  HbA1C: No results found for: "HGBA1C"  CBG: Recent Labs  Lab 09/25/21 1439  GLUCAP 108*   Review of Systems:   Patient is encephalopathic and/or intubated. Therefore history has been obtained from chart review.   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   Allergies: Allergies  Allergen Reactions   Cefprozil Other (See Comments)    UNKNOWN   Codeine Anaphylaxis and Itching   Morphine Hives   Oxycodone Hives   Iodinated Contrast Media Diarrhea   Lisinopril Cough    Home Medications: Prior to Admission medications   Medication Sig Start Date End Date Taking? Authorizing Provider  losartan (COZAAR) 50 MG tablet Take 50 mg by mouth daily. 09/07/21   [provider]  metoprolol succinate (TOPROL-XL) 50 MG 24 hr tablet Take 50 mg by mouth daily. 07/20/21   [provider]  Multiple Vitamin (MULTI-VITAMIN) tablet Take by mouth.    [provider]  pantoprazole (PROTONIX) 20 MG tablet Take 20 mg by mouth daily. 05/04/21    [provider]  propafenone (RYTHMOL SR) 225 MG 12 hr capsule Take 225 mg by mouth every 12 (twelve) hours. 05/13/21   [provider]  rivaroxaban (XARELTO) 20 MG TABS tablet Take 1 tablet by mouth daily. 09/18/21   [provider]    Critical care time: 36 minutes   Lestine Mount, PA-C [facility] 09/26/21 1:04 AM  Please see [email or web address] for pager details.  From 7A-7P if no response, please call [phone] After hours, please call ELink [phone]

## 2021-09-26 NOTE — Progress Notes (Signed)
PT Cancellation Note  Patient Details Name: Charlotte Hunter MRN: [id number] DOB: 04-Dec-1955   Cancelled Treatment:    Reason Eval/Treat Not Completed: Medical issues which prohibited therapy at this time, pt with active bedrest at this time as well, will continue to follow and evaluate as appropriate.   Vickki Muff, PT, DPT   Acute Rehabilitation Department   Ronnie Derby 09/26/2021, 8:24 AM

## 2021-09-26 NOTE — TOC CAGE-AID Note (Signed)
Transition of Care [facility]) - CAGE-AID Screening   Patient Details  Name: Charlotte Hunter MRN: [id number] Date of Birth: Apr 19, 1955  Transition of [facility]) CM/SW Contact:    Takahiro Godinho C Tarpley-Carter, LCSWA Phone Number: 09/26/2021, 1:17 PM   Clinical Narrative: Pt is unable to participate in Cage Aid. Pt was intubated and sedated.  Ivah Girardot Tarpley-Carter, MSW, LCSW-A Pronouns:  She/Her/Hers [facility] HealthTransitions of Care Clinical Social Worker Direct Number:  [phone] [email or web address]  CAGE-AID Screening: Substance Abuse Screening unable to be completed due to: : Patient unable to participate

## 2021-09-26 NOTE — Progress Notes (Signed)
NAME:  Charlotte Hunter, MRN:  [id number], DOB:  [birth year], LOS: 1 ADMISSION DATE:  09/25/2021 CONSULTATION DATE:  09/26/2021 REFERRING MD:  Cheral Marker - Neuro CHIEF COMPLAINT:  ICH, vent management   History of Present Illness:  66 year old woman who presented to [facility] 6/13 as a Code Stroke with left-sided weakness, HA, nausea. LKN 1400 6/13. NIHSS 13. PMHx significant for HTN, PAF (s/p ablation, on Eliquis/Xarelto), non-rheumatic AI (followed by [facility]), GERD, frequent UTI.  Patient presented to [facility] with L-sided weakness and HA. STAT CT Head demonstrated acute intraparenchymal hemorrhage centered at the right thalamus (estimated volume 49mL) small volume intraventricular extension with blood in the third and fourth ventricles, no hydrocephalus. Mild mass effect of 1-36mm right to left midline shift. Not a candidate for TNK, given ICH. Andexxa reversal agent administered in the setting of home Xarelto for PAF. Intubated in ED for airway protection. Per chart review, following commands on sedation post-intubation. Cleviprex gtt was initiated for BP management. HTS 3% started per Neuro. Transferred to [facility] for a higher level of care.  PCCM consulted for vent/BP management.  Pertinent Medical History:  HTN, PAF (s/p ablation, on Eliquis/Xarelto), ectopic atrial rhythm, non-rheumatic AI (followed by [facility]), GERD, frequent UTI  Significant Hospital Events: Including procedures, antibiotic start and stop dates in addition to other pertinent events   6/13 - Presented to [facility] via EMS for L-sided weakness, HA, nausea/vomiting. LKN 1400. Code Stroke called. CT Head with R thalamic ICH, small amount of mass effect/midline shift. Neuro consulted. Andexxa administered. 6/14 - Transferred from [facility] to [facility]. Repeat CT Head stable. HTS 3% started. PCCM consulted for vent/BP management.  Interim History / Subjective:  Alert and interactive on vent this am   Objective:  Blood pressure 130/68,  pulse 67, temperature 99 F (37.2 C), resp. rate 18, weight 82.1 kg, SpO2 99 %.    Vent Mode: PRVC FiO2 (%):  [40 %-50 %] 40 % Set Rate:  [18 bmp] 18 bmp Vt Set:  [450 mL] 450 mL PEEP:  [5 cmH20] 5 cmH20 Plateau Pressure:  [15 cmH20-16 cmH20] 16 cmH20   Intake/Output Summary (Last 24 hours) at 09/26/2021 [id number] Last data filed at 09/26/2021 0800 Gross per 24 hour  Intake 472.72 ml  Output 200 ml  Net 272.72 ml    Filed Weights   09/25/21 2340  Weight: 82.1 kg   Physical Examination: General: Acute on chronically ill appearing elderly female on mechanical ventilation, in NAD HEENT: ETT, MM pink/moist, PERRL,  Neuro: Alert and interactive on vent, full strength to right, flaccid to left CV: s1s2 regular rate and rhythm, no murmur, rubs, or gallops,  PULM:  Clear to ascultation, no increased work of breathing, no added breath sounds  GI: soft, bowel sounds active in all 4 quadrants, non-tender, non-distended, Extremities: warm/dry, no edema  Skin: no rashes or lesions  Resolved Hospital Problem List:    Assessment & Plan:  Acute R thalamic ICH, likely hypertensive -CT Head demonstrated acute intraparenchymal hemorrhage centered at the right thalamus (estimated volume 12mL) small volume intraventricular extension with blood in the third and fourth ventricles, no hydrocephalus. Mild mass effect of 1-68mm right to left midline shift. Possibility of mass not ruled out, less likely HT of ischemic CVA per Neuro.  P: Management per neurology  Maintain neuro protective measures; goal for eurothermia, euglycemia, eunatermia, normoxia, and PCO2 goal of 35-40 Nutrition and bowel regiment  Seizure precautions  Aspirations precautions  SBP goal per neurology  Frequent neuro checks  HTS therapy per neuro   Respiratory insufficiency in the setting of ICH P: Mentating beautifully this am, trial of SBT and hopefully can extubate Continue ventilator support with lung protective strategies   Wean PEEP and FiO2 for sats greater than 90%. Head of bed elevated 30 degrees. Plateau pressures less than 30 cm H20.  Follow intermittent chest x-ray and ABG.   SAT/SBT as tolerated, mentation preclude extubation  Ensure adequate pulmonary hygiene  Follow cultures  VAP bundle in place  PAD protocol  Paroxysmal AF Ectopic atrial rhythm -S/p ablation. Patient endorses use of Eliquis PTA -Andexxa administered in the setting of ICH Hypertension -Home medication includes; Cozaar, and Toprol XL P: Hold home Eliquis Will need ongoing discussion regarding risk vs benefit for ongoing AC Resume home antihypertensives and or beta blocker as able    Best Practice: (right click and "Reselect all SmartList Selections" daily)   Per Primary Team  Critical care time: Additional 30 mins critical care time    Mivaan Corbitt D. Kenton Kingfisher, NP-C [facility] Personal contact information can be found on Amion  09/26/2021, 9:32 AM

## 2021-09-26 NOTE — Progress Notes (Signed)
Echocardiogram 2D Echocardiogram has been performed.  Rodrigo Ran 09/26/2021, 10:34 AM

## 2021-09-27 ENCOUNTER — Inpatient Hospital Stay (HOSPITAL_COMMUNITY): Payer: Medicare Other

## 2021-09-27 DIAGNOSIS — Z9911 Dependence on respirator [ventilator] status: Secondary | ICD-10-CM

## 2021-09-27 DIAGNOSIS — I480 Paroxysmal atrial fibrillation: Secondary | ICD-10-CM | POA: Diagnosis not present

## 2021-09-27 DIAGNOSIS — I619 Nontraumatic intracerebral hemorrhage, unspecified: Secondary | ICD-10-CM

## 2021-09-27 DIAGNOSIS — I161 Hypertensive emergency: Secondary | ICD-10-CM | POA: Diagnosis not present

## 2021-09-27 DIAGNOSIS — J9601 Acute respiratory failure with hypoxia: Secondary | ICD-10-CM

## 2021-09-27 DIAGNOSIS — I615 Nontraumatic intracerebral hemorrhage, intraventricular: Secondary | ICD-10-CM | POA: Diagnosis not present

## 2021-09-27 DIAGNOSIS — I610 Nontraumatic intracerebral hemorrhage in hemisphere, subcortical: Secondary | ICD-10-CM | POA: Diagnosis not present

## 2021-09-27 LAB — BASIC METABOLIC PANEL
Anion gap: 8 (ref 5–15)
BUN: 14 mg/dL (ref 8–23)
CO2: 23 mmol/L (ref 22–32)
Calcium: 8.8 mg/dL — ABNORMAL LOW (ref 8.9–10.3)
Chloride: 114 mmol/L — ABNORMAL HIGH (ref 98–111)
Creatinine, Ser: 0.59 mg/dL (ref 0.44–1.00)
GFR, Estimated: >60 mL/min (ref >60)
Glucose, Bld: 100 mg/dL — ABNORMAL HIGH (ref 70–99)
Potassium: 3.2 mmol/L — ABNORMAL LOW (ref 3.5–5.1)
Sodium: 145 mmol/L (ref 135–145)

## 2021-09-27 LAB — LIPID PANEL
Cholesterol: 211 mg/dL — ABNORMAL HIGH (ref 0–200)
HDL: 41 mg/dL (ref >40)
LDL Cholesterol: 134 mg/dL — ABNORMAL HIGH (ref 0–99)
Total CHOL/HDL Ratio: 5.1 RATIO
Triglycerides: 180 mg/dL — ABNORMAL HIGH (ref <150)
VLDL: 36 mg/dL (ref 0–40)

## 2021-09-27 LAB — CBC
HCT: 32.1 % — ABNORMAL LOW (ref 36.0–46.0)
Hemoglobin: 10.3 g/dL — ABNORMAL LOW (ref 12.0–15.0)
MCH: 30.2 pg (ref 26.0–34.0)
MCHC: 32.1 g/dL (ref 30.0–36.0)
MCV: 94.1 fL (ref 80.0–100.0)
Platelets: 190 10*3/uL (ref 150–400)
RBC: 3.41 MIL/uL — ABNORMAL LOW (ref 3.87–5.11)
RDW: 15 % (ref 11.5–15.5)
WBC: 8.7 10*3/uL (ref 4.0–10.5)
nRBC: 0 % (ref 0.0–0.2)

## 2021-09-27 IMAGING — MR MR MRA HEAD W/O CM
1 series · 17 of 48 positions shown · non-contrast
Comparison: Prior CT from [DATE].

CLINICAL DATA: Follow-up examination for hemorrhagic stroke.

EXAM:
MRI HEAD WITHOUT CONTRAST
MRA HEAD WITHOUT CONTRAST
TECHNIQUE: Multiplanar, multi-echo pulse sequences of the brain and surrounding
structures were acquired without intravenous contrast. Angiographic
images of the Circle of Willis were acquired using MRA technique
without intravenous contrast.

[Series 5: 3d cow · axial · 0.5mm · 0.41mm/px · z∈[-58,+40]mm · 17 of 224 slices shown]
[im 1/224]
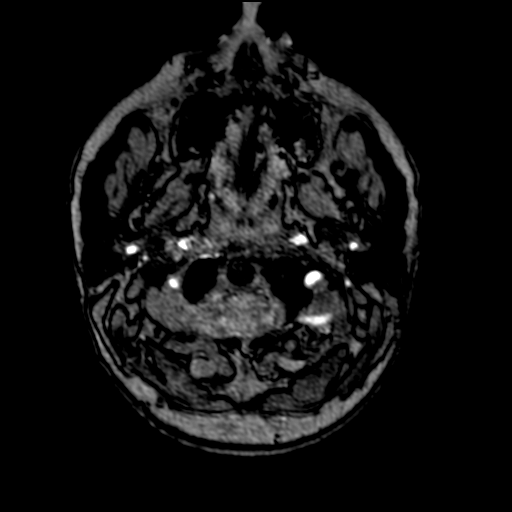
[im 5/224]
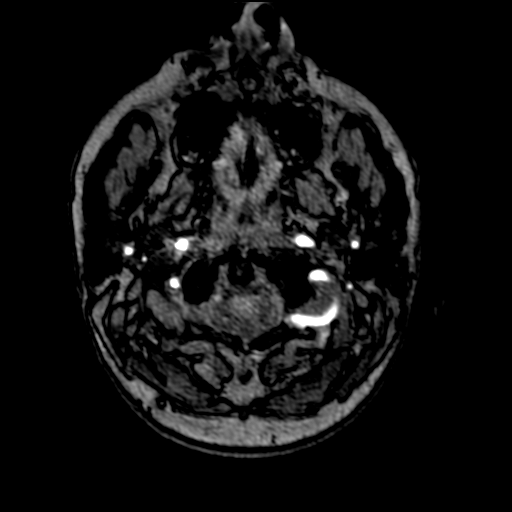
[im 10/224]
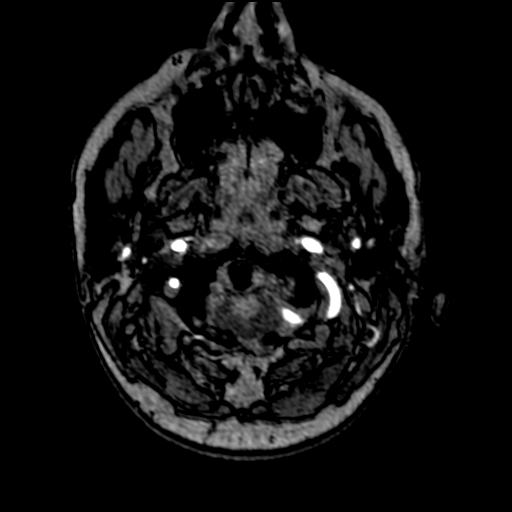
[im 15/224]
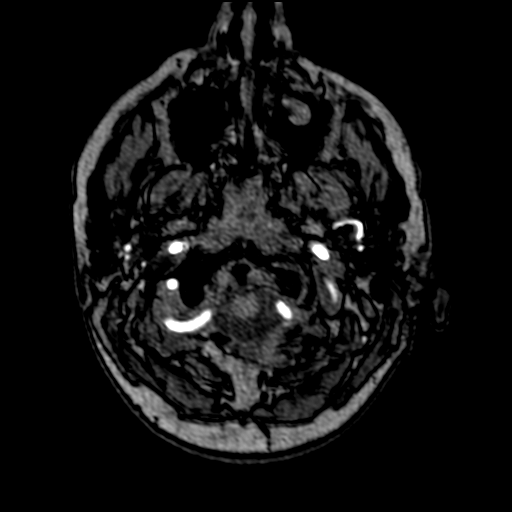
[im 19/224]
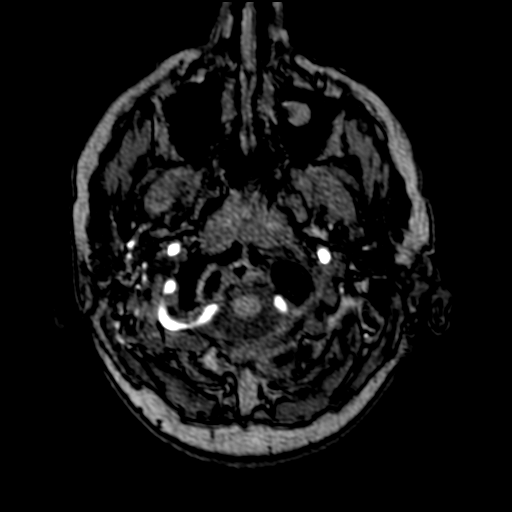
[im 24/224]
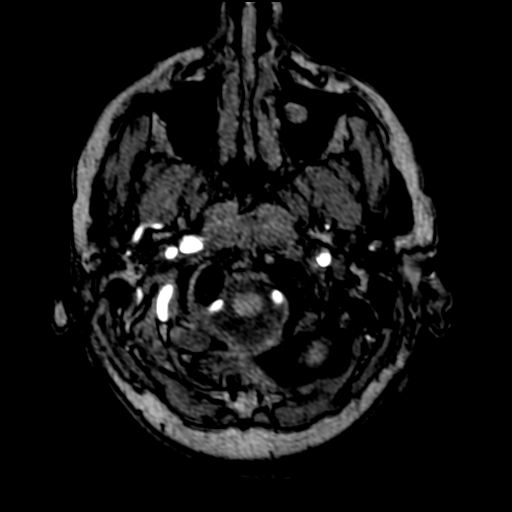
[im 29/224]
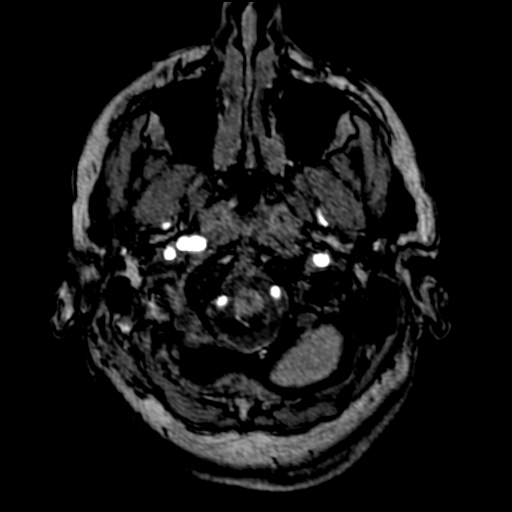
[im 38/224]
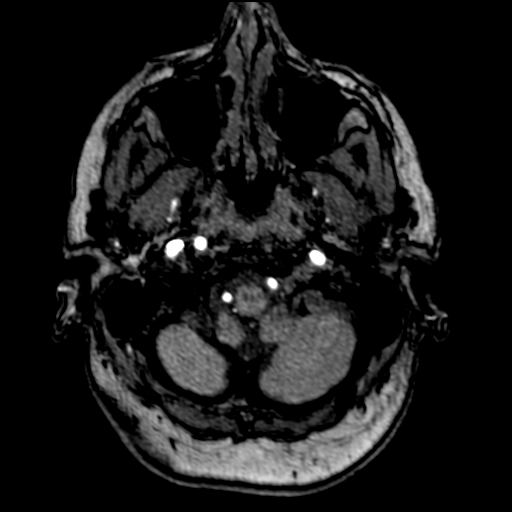
[im 43/224]
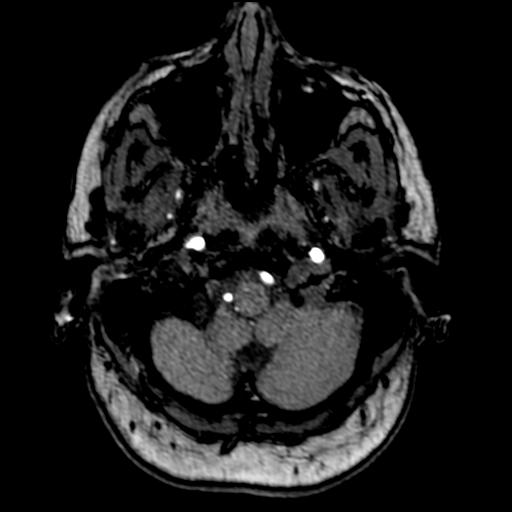
[im 72/224]
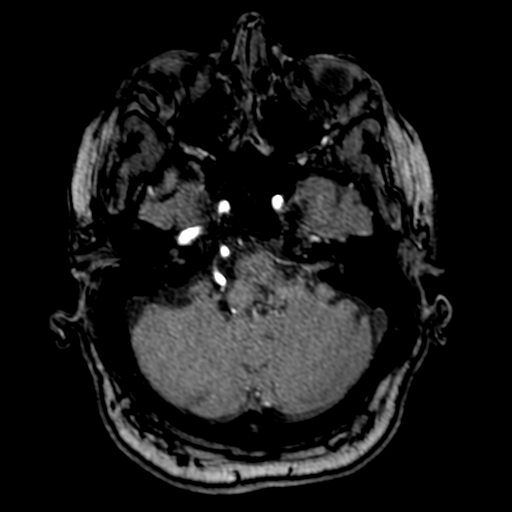
[im 100/224]
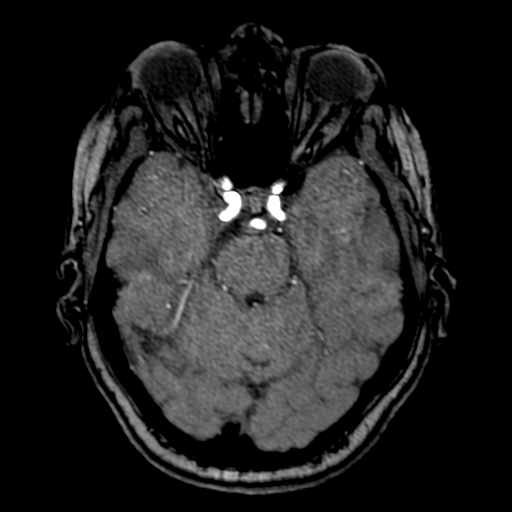
[im 114/224]
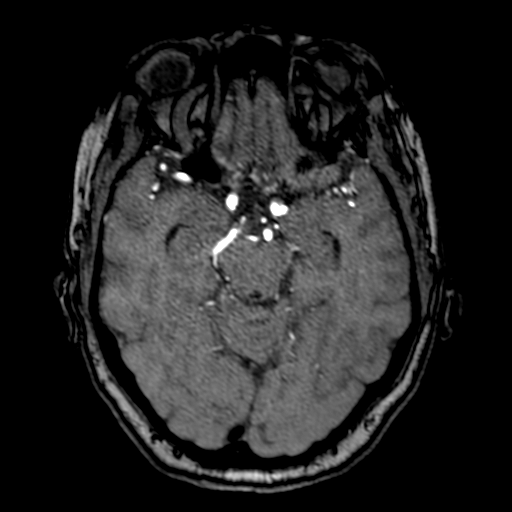
[im 129/224]
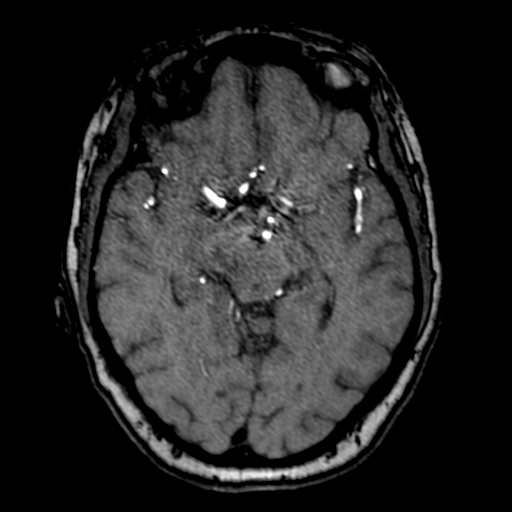
[im 157/224]
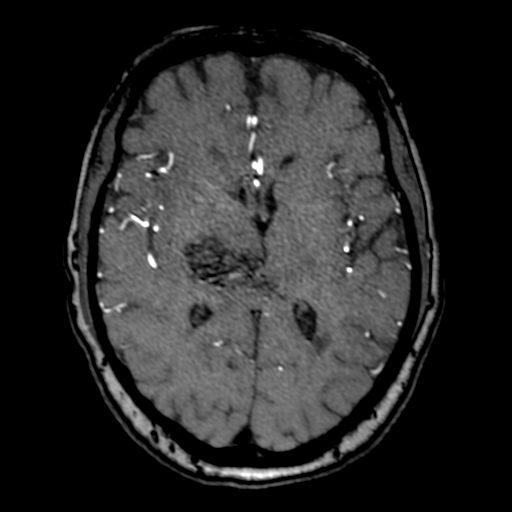
[im 186/224]
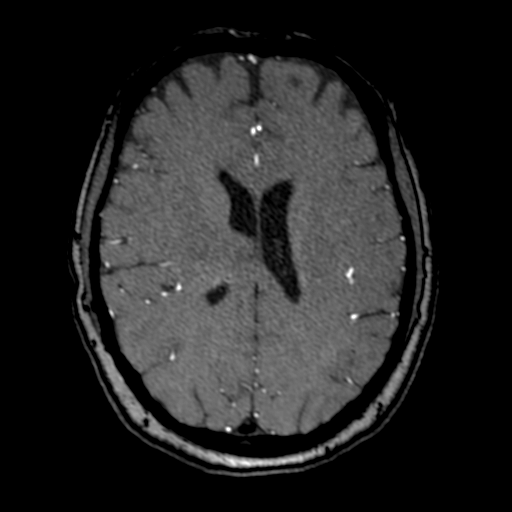
[im 190/224]
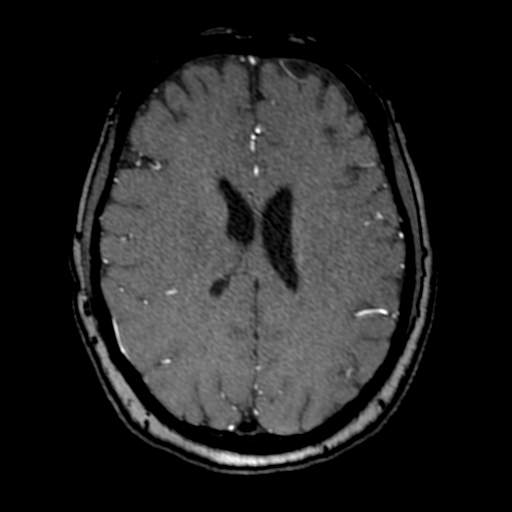
[im 214/224]
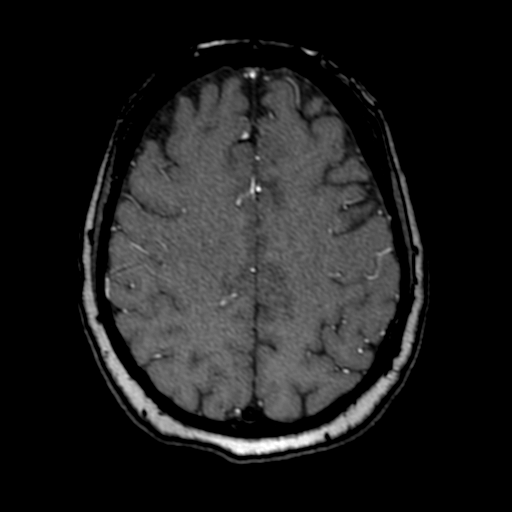

[17 of 48 positions shown; findings below may reference images not displayed]

FINDINGS: MRI HEAD FINDINGS

Brain: Cerebral volume within normal limits. Scattered patchy
T2/FLAIR hyperintensity involving the periventricular deep white
matter both cerebral hemispheres, most consistent with chronic small
vessel ischemic disease, moderately advanced in nature.

Previously identified acute intraparenchymal hemorrhage centered at
the right thalamus again seen, not significantly changed in size or
morphology as compared to prior CT. Lesion measures approximately
2.9 x 1.6 x 2.2 cm by MRI. Surrounding T2/FLAIR signal consistent
with edema, mildly increased from prior. Edema extends inferiorly
into the right cerebral peduncle/midbrain. Mild localized
right-to-left shift. Intraventricular extension with small volume
intraventricular hemorrhage, similar as well. No hydrocephalus or
trapping.

No other evidence for acute or subacute ischemia. Focus of mild
diffusion signal abnormality at the posterior left frontal centrum
semi ovale felt to be related to susceptibility artifact (series 5,
image 87). No other acute intracranial hemorrhage. Few scattered
chronic micro hemorrhages noted, likely hypertensive in nature.

No mass lesion or extra-axial fluid collection. Pituitary gland
suprasellar region within normal limits.

Vascular: Major intracranial vascular flow voids are maintained.

Skull and upper cervical spine: Craniocervical junction within
normal limits. Bone marrow signal intensity normal. Small lipoma at
the right frontal scalp noted. Scalp soft tissues otherwise
unremarkable.

Sinuses/Orbits: Globes orbital soft tissues within normal limits.
Mild scattered mucosal thickening noted about the ethmoidal air
cells. Few small left maxillary sinus retention cyst noted. Trace
right mastoid effusion. Patient is intubated.

Other: None.

MRA HEAD FINDINGS

Anterior circulation: Visualized distal cervical segments of the
internal carotid arteries are patent with antegrade flow. Distal
cervical right ICA is tortuous. Petrous, cavernous, supraclinoid
segments patent without stenosis or other abnormality. A1 segments
patent bilaterally. Normal anterior communicating artery complex.
Anterior cerebral arteries patent without stenosis. No M1 stenosis
or occlusion. No proximal MCA branch occlusion. Distal MCA branches
perfused and symmetric.

Posterior circulation: Both vertebral arteries patent without
stenosis. Left vertebral artery slightly dominant. Both PICA patent.
Basilar patent to its distal aspect without stenosis. Short-segment
fenestration involving distal basilar artery just prior to the apex
noted. Superior cerebellar arteries patent bilaterally. Both PCA
supplied via the basilar as well as small bilateral posterior
communicating arteries. Right PCA widely patent to its distal
aspect. Focal severe distal left P2 stenosis noted (series [HM],
image 4). Left PCA otherwise patent as well.

Anatomic variants: None significant. No intracranial aneurysm. No
other vascular malformation seen underlying the right thalamic
hemorrhage.
IMPRESSION: MRI HEAD IMPRESSION:

1. Unchanged size and morphology of acute intraparenchymal
hemorrhage centered at the right thalamus. Surrounding edema and
regional mass effect has mildly increased as compared to prior CT
from [DATE].
[DATE]. Intraventricular extension with small volume intraventricular
hemorrhage, similar. No hydrocephalus or trapping.
3. Underlying moderate chronic microvascular ischemic disease.

MRA HEAD IMPRESSION:

1. Negative intracranial MRA for large vessel occlusion.
2. Focal severe distal left P2 stenosis.
3. Otherwise negative intracranial MRA. No other hemodynamically
significant or correctable stenosis. No aneurysm or other vascular
malformation seen underlying the right thalamic hemorrhage.

## 2021-09-27 IMAGING — MR MR HEAD W/O CM
12 of 13 series · 44 of 48 positions shown · non-contrast
Comparison: Prior CT from [DATE].

CLINICAL DATA: Follow-up examination for hemorrhagic stroke.

EXAM:
MRI HEAD WITHOUT CONTRAST
MRA HEAD WITHOUT CONTRAST
TECHNIQUE: Multiplanar, multi-echo pulse sequences of the brain and surrounding
structures were acquired without intravenous contrast. Angiographic
images of the Circle of Willis were acquired using MRA technique
without intravenous contrast.

[Series 5: DWI · axial · 3.0mm · 0.88mm/px · z∈[-55,+86]mm · 8 of 104 slices shown (1 of 4)]
[im 1/104]
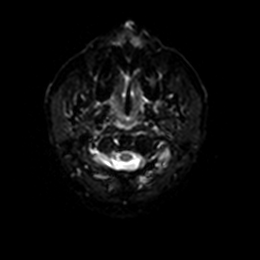
[im 15/104]
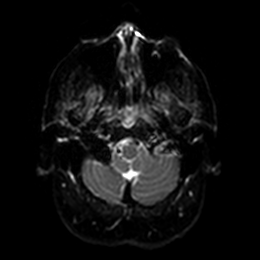
[im 30/104]
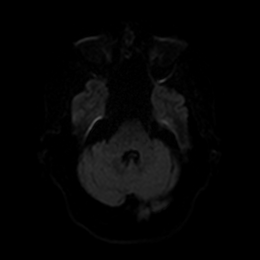
[im 45/104]
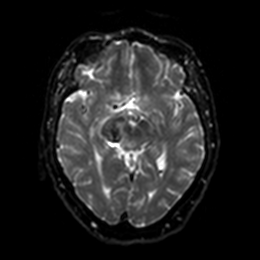
[im 59/104]
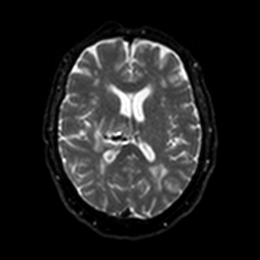
[im 74/104]
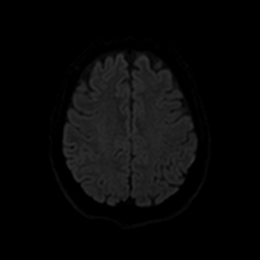
[im 89/104]
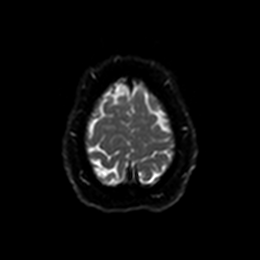
[im 104/104]
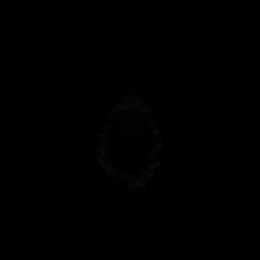

[Series 6: DWI · axial · 3.0mm · 0.88mm/px · z∈[-55,+86]mm · 4 of 52 slices shown (2 of 4)]
[im 1/52]
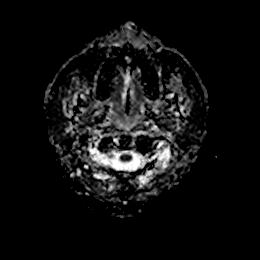
[im 18/52]
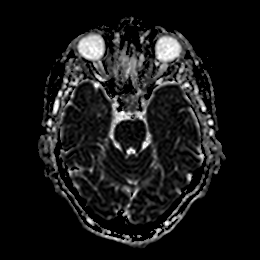
[im 35/52]
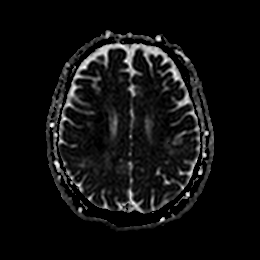
[im 52/52]
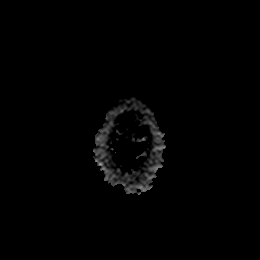

[Series 7: DWI · coronal · 4.0mm · 0.88mm/px · 6 of 74 slices shown (3 of 4)]
[im 1/74]
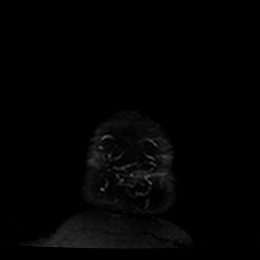
[im 15/74]
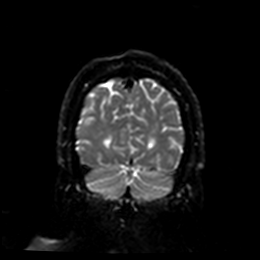
[im 30/74]
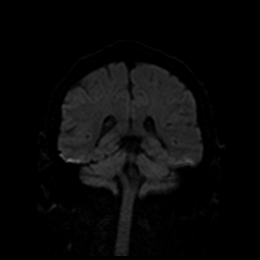
[im 44/74]
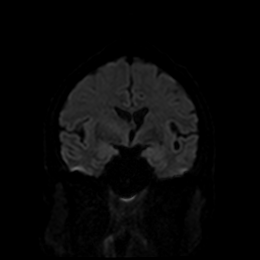
[im 59/74]
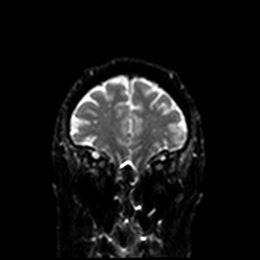
[im 74/74]
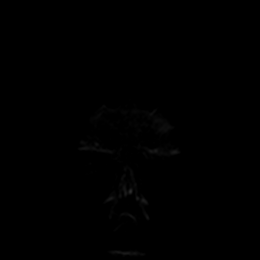

[Series 8: DWI · coronal · 4.0mm · 0.88mm/px · 3 of 37 slices shown (4 of 4)]
[im 1/37]
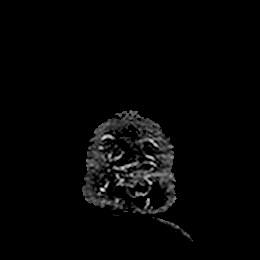
[im 19/37]
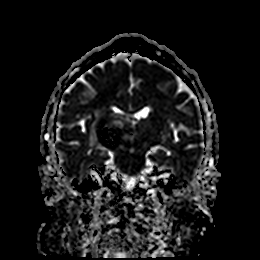
[im 37/37]
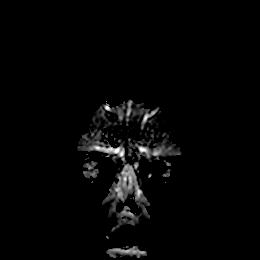

[Series 9: T1 · sagittal · 5.0mm · 0.75mm/px · 2 of 25 slices shown]
[im 1/25]
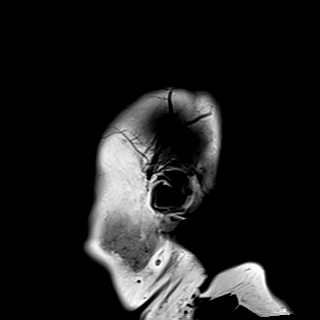
[im 25/25]
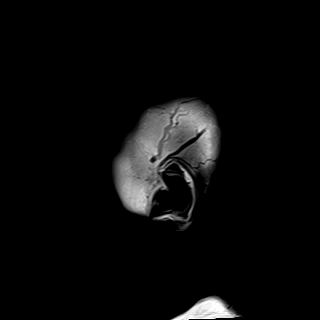

[Series 10: T2 · axial · 5.0mm · 0.72mm/px · z∈[-62,+83]mm · 2 of 27 slices shown (1 of 2)]
[im 1/27]
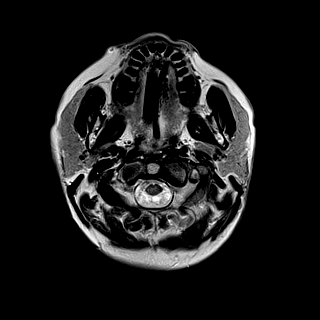
[im 27/27]
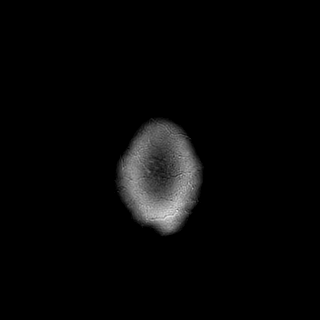

[Series 11: FLAIR · axial · 5.0mm · 0.45mm/px · z∈[-60,+85]mm · 2 of 27 slices shown]
[im 1/27]
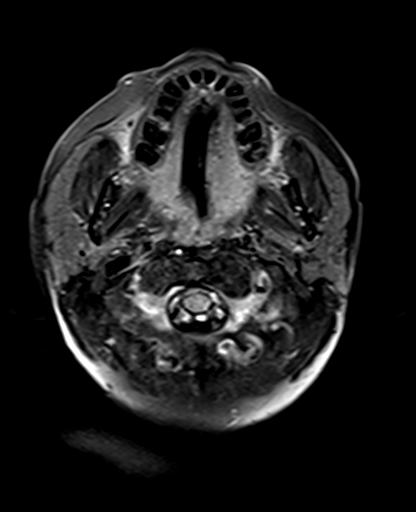
[im 27/27]
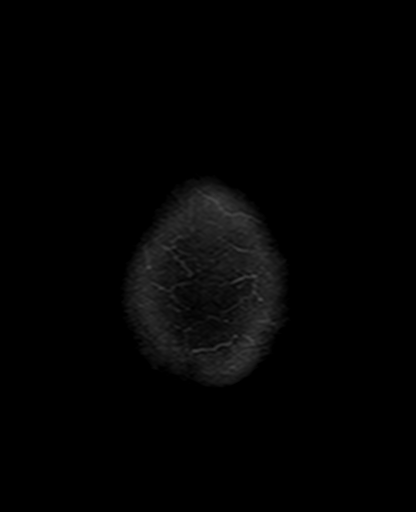

[Series 12: mag_images · axial · 3.0mm · 0.90mm/px · z∈[-54,+87]mm · 4 of 52 slices shown]
[im 1/52]
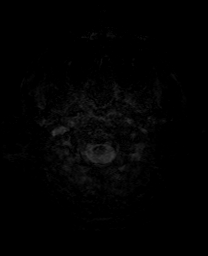
[im 18/52]
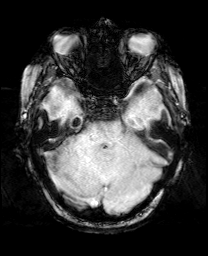
[im 35/52]
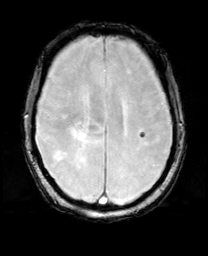
[im 52/52]
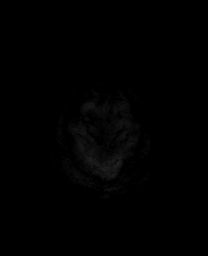

[Series 13: pha_images · axial · 3.0mm · 0.90mm/px · z∈[-54,+87]mm · 4 of 52 slices shown]
[im 1/52]
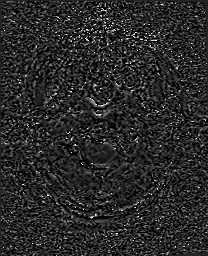
[im 18/52]
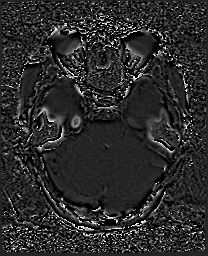
[im 35/52]
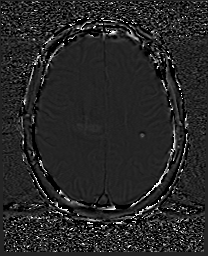
[im 52/52]
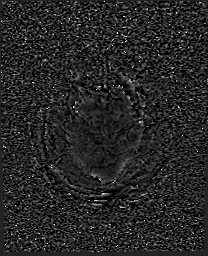

[Series 14: swi_images · axial · 3.0mm · 0.90mm/px · z∈[-54,+87]mm · 4 of 52 slices shown]
[im 1/52]
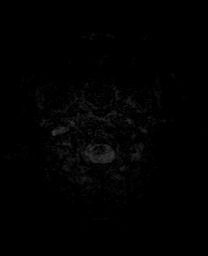
[im 18/52]
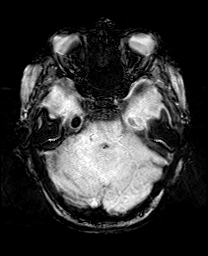
[im 35/52]
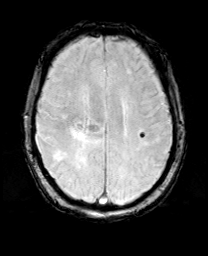
[im 52/52]
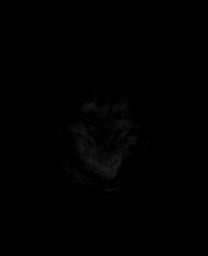

[Series 15: mip_images(sw) · axial · 24.0mm · 0.90mm/px · z∈[-44,+77]mm · 3 of 45 slices shown]
[im 1/45]
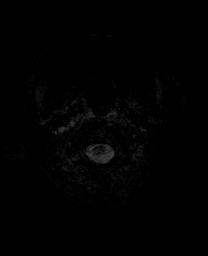
[im 23/45]
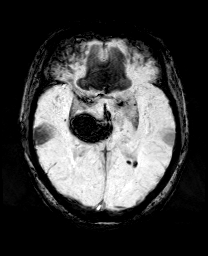
[im 45/45]
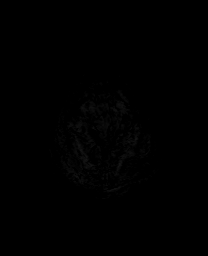

[Series 17: T2 · coronal · 5.0mm · 0.34mm/px · 2 of 32 slices shown (2 of 2)]
[im 1/32]
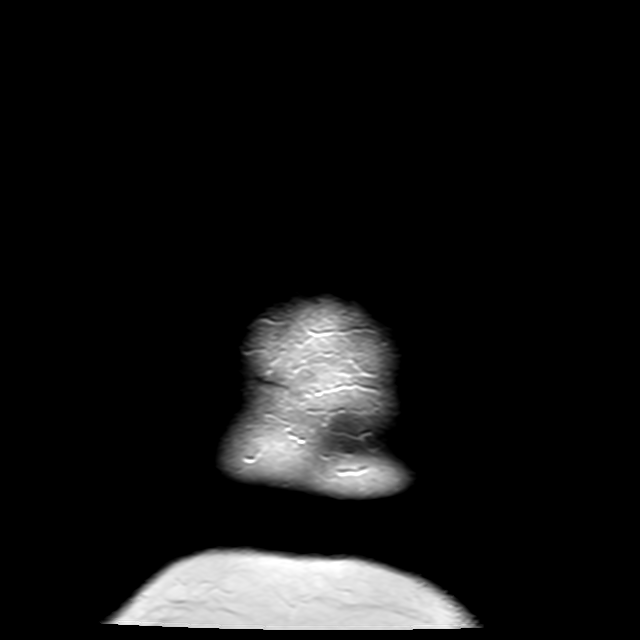
[im 32/32]
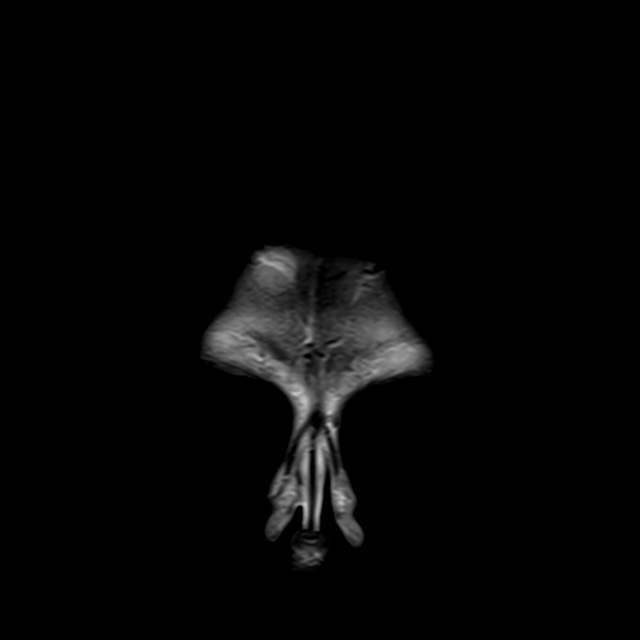

[44 of 48 positions shown; findings below may reference images not displayed]

FINDINGS: MRI HEAD FINDINGS

Brain: Cerebral volume within normal limits. Scattered patchy
T2/FLAIR hyperintensity involving the periventricular deep white
matter both cerebral hemispheres, most consistent with chronic small
vessel ischemic disease, moderately advanced in nature.

Previously identified acute intraparenchymal hemorrhage centered at
the right thalamus again seen, not significantly changed in size or
morphology as compared to prior CT. Lesion measures approximately
2.9 x 1.6 x 2.2 cm by MRI. Surrounding T2/FLAIR signal consistent
with edema, mildly increased from prior. Edema extends inferiorly
into the right cerebral peduncle/midbrain. Mild localized
right-to-left shift. Intraventricular extension with small volume
intraventricular hemorrhage, similar as well. No hydrocephalus or
trapping.

No other evidence for acute or subacute ischemia. Focus of mild
diffusion signal abnormality at the posterior left frontal centrum
semi ovale felt to be related to susceptibility artifact (series 5,
image 87). No other acute intracranial hemorrhage. Few scattered
chronic micro hemorrhages noted, likely hypertensive in nature.

No mass lesion or extra-axial fluid collection. Pituitary gland
suprasellar region within normal limits.

Vascular: Major intracranial vascular flow voids are maintained.

Skull and upper cervical spine: Craniocervical junction within
normal limits. Bone marrow signal intensity normal. Small lipoma at
the right frontal scalp noted. Scalp soft tissues otherwise
unremarkable.

Sinuses/Orbits: Globes orbital soft tissues within normal limits.
Mild scattered mucosal thickening noted about the ethmoidal air
cells. Few small left maxillary sinus retention cyst noted. Trace
right mastoid effusion. Patient is intubated.

Other: None.

MRA HEAD FINDINGS

Anterior circulation: Visualized distal cervical segments of the
internal carotid arteries are patent with antegrade flow. Distal
cervical right ICA is tortuous. Petrous, cavernous, supraclinoid
segments patent without stenosis or other abnormality. A1 segments
patent bilaterally. Normal anterior communicating artery complex.
Anterior cerebral arteries patent without stenosis. No M1 stenosis
or occlusion. No proximal MCA branch occlusion. Distal MCA branches
perfused and symmetric.

Posterior circulation: Both vertebral arteries patent without
stenosis. Left vertebral artery slightly dominant. Both PICA patent.
Basilar patent to its distal aspect without stenosis. Short-segment
fenestration involving distal basilar artery just prior to the apex
noted. Superior cerebellar arteries patent bilaterally. Both PCA
supplied via the basilar as well as small bilateral posterior
communicating arteries. Right PCA widely patent to its distal
aspect. Focal severe distal left P2 stenosis noted (series [HM],
image 4). Left PCA otherwise patent as well.

Anatomic variants: None significant. No intracranial aneurysm. No
other vascular malformation seen underlying the right thalamic
hemorrhage.
IMPRESSION: MRI HEAD IMPRESSION:

1. Unchanged size and morphology of acute intraparenchymal
hemorrhage centered at the right thalamus. Surrounding edema and
regional mass effect has mildly increased as compared to prior CT
from [DATE].
[DATE]. Intraventricular extension with small volume intraventricular
hemorrhage, similar. No hydrocephalus or trapping.
3. Underlying moderate chronic microvascular ischemic disease.

MRA HEAD IMPRESSION:

1. Negative intracranial MRA for large vessel occlusion.
2. Focal severe distal left P2 stenosis.
3. Otherwise negative intracranial MRA. No other hemodynamically
significant or correctable stenosis. No aneurysm or other vascular
malformation seen underlying the right thalamic hemorrhage.

## 2021-09-27 MED ORDER — MUSCLE RUB 10-15 % EX CREA
TOPICAL_CREAM | CUTANEOUS | Status: DC | PRN
  Filled 2021-09-27: qty 85

## 2021-09-27 MED ORDER — PANTOPRAZOLE SODIUM 40 MG PO TBEC
40.0000 mg | DELAYED_RELEASE_TABLET | Freq: Every day | ORAL | Status: DC
  Administered 2021-09-28 – 2021-09-30 (×3): 40 mg via ORAL
  Filled 2021-09-27 (×3): qty 1

## 2021-09-27 MED ORDER — DOCUSATE SODIUM 50 MG/5ML PO LIQD: 100.0000 mg | Freq: Two times a day (BID) | ORAL | Status: DC

## 2021-09-27 MED ORDER — SENNOSIDES-DOCUSATE SODIUM 8.6-50 MG PO TABS
1.0000 | ORAL_TABLET | Freq: Two times a day (BID) | ORAL | Status: DC
  Administered 2021-09-28 – 2021-10-01 (×7): 1 via ORAL
  Filled 2021-09-27 (×7): qty 1

## 2021-09-27 MED ORDER — METOPROLOL TARTRATE 25 MG PO TABS
25.0000 mg | ORAL_TABLET | Freq: Two times a day (BID) | ORAL | Status: DC
  Administered 2021-09-28 – 2021-10-01 (×7): 25 mg via ORAL
  Filled 2021-09-27 (×7): qty 1

## 2021-09-27 MED ORDER — ORAL CARE MOUTH RINSE: 15.0000 mL | OROMUCOSAL | Status: DC | PRN

## 2021-09-27 MED ORDER — LOSARTAN POTASSIUM 50 MG PO TABS
50.0000 mg | ORAL_TABLET | Freq: Every day | ORAL | Status: DC
  Administered 2021-09-28 – 2021-10-01 (×4): 50 mg via ORAL
  Filled 2021-09-27 (×4): qty 1

## 2021-09-27 MED ORDER — HYDRALAZINE HCL 20 MG/ML IJ SOLN: 10.0000 mg | INTRAMUSCULAR | Status: DC | PRN

## 2021-09-27 MED ORDER — POTASSIUM CHLORIDE 20 MEQ PO PACK
40.0000 meq | PACK | Freq: Three times a day (TID) | ORAL | Status: AC
Start: 2021-09-27 — End: 2021-09-27
  Administered 2021-09-27: 40 meq
  Filled 2021-09-27: qty 2

## 2021-09-27 MED ORDER — FENTANYL CITRATE PF 50 MCG/ML IJ SOSY: 25.0000 ug | PREFILLED_SYRINGE | INTRAMUSCULAR | Status: DC | PRN

## 2021-09-27 MED ORDER — ORAL CARE MOUTH RINSE
15.0000 mL | OROMUCOSAL | Status: DC
  Administered 2021-09-27 – 2021-10-01 (×13): 15 mL via OROMUCOSAL

## 2021-09-27 MED ORDER — POLYETHYLENE GLYCOL 3350 17 G PO PACK
17.0000 g | PACK | Freq: Every day | ORAL | Status: DC
Start: 2021-09-27 — End: 2021-09-27

## 2021-09-27 NOTE — Progress Notes (Signed)
SLP Cancellation Note  Patient Details Name: Charlotte Hunter MRN: [id number] DOB: 10/13/1955   Cancelled treatment:       Reason Eval/Treat Not Completed: Patient not medically ready (remains on vent). Will f/u as able.    Mahala Menghini., M.A. CCC-SLP Acute Rehabilitation Services Office [phone]  Secure chat preferred  09/27/2021, 7:38 AM

## 2021-09-27 NOTE — Progress Notes (Signed)
NAME:  Charlotte Hunter, MRN:  [id number], DOB:  03/01/1956, LOS: 2 ADMISSION DATE:  09/25/2021 CONSULTATION DATE:  09/26/2021 REFERRING MD:  Otelia Limes - Neuro CHIEF COMPLAINT:  ICH, vent management   History of Present Illness:  66 year old woman who presented to [facility] 6/13 as a Code Stroke with left-sided weakness, HA, nausea. LKN 1400 6/13. NIHSS 13. PMHx significant for HTN, PAF (s/p ablation, on Eliquis/Xarelto), non-rheumatic AI (followed by [facility]), GERD, frequent UTI.  Patient presented to [facility] with L-sided weakness and HA. STAT CT Head demonstrated acute intraparenchymal hemorrhage centered at the right thalamus (estimated volume 88mL) small volume intraventricular extension with blood in the third and fourth ventricles, no hydrocephalus. Mild mass effect of 1-59mm right to left midline shift. Not a candidate for TNK, given ICH. Andexxa reversal agent administered in the setting of home Xarelto for PAF. Intubated in ED for airway protection. Per chart review, following commands on sedation post-intubation. Cleviprex gtt was initiated for BP management. HTS 3% started per Neuro. Transferred to [facility] for a higher level of care.  PCCM consulted for vent/BP management.  Pertinent Medical History:  HTN, PAF (s/p ablation, on Eliquis/Xarelto), ectopic atrial rhythm, non-rheumatic AI (followed by [facility]), GERD, frequent UTI  Significant Hospital Events: Including procedures, antibiotic start and stop dates in addition to other pertinent events   6/13 - Presented to [facility] via EMS for L-sided weakness, HA, nausea/vomiting. LKN 1400. Code Stroke called. CT Head with R thalamic ICH, small amount of mass effect/midline shift. Neuro consulted. Andexxa administered. 6/14 - Transferred [facility] to [facility]. Repeat CT Head stable. HTS 3% started. PCCM consulted for vent/BP management.  Interim History / Subjective:  Improvement in nausea, remains on minimal sedation.   Objective:  Blood  pressure 138/61, pulse (!) 58, temperature 99 F (37.2 C), resp. rate 18, height 5\' 6"  (1.676 m), weight 82.1 kg, SpO2 100 %.    Vent Mode: PRVC FiO2 (%):  [35 %-40 %] 35 % Set Rate:  [18 bmp] 18 bmp Vt Set:  [450 mL] 450 mL PEEP:  [5 cmH20] 5 cmH20 Pressure Support:  [5 cmH20] 5 cmH20 Plateau Pressure:  [13 cmH20-16 cmH20] 15 cmH20   Intake/Output Summary (Last 24 hours) at 09/27/2021 0813 Last data filed at 09/27/2021 09/29/2021 Gross per 24 hour  Intake 1378.87 ml  Output 1260 ml  Net 118.87 ml   Filed Weights   09/25/21 2340  Weight: 82.1 kg   Physical Examination: General: adult female on vent, no distress noted  HEENT: ETT, OG in place  Neuro: interactive with physical/verbal stimulation on vent, follows commands on right hemibody, left hemibody flaccid, pupils intact and reactive, motor 5/5 on right  CV: s1s2 regular rate and rhythm, no murmur, rubs, or gallops,  PULM:  Clear to ascultation, no increased work of breathing, no added breath sounds  GI: soft, bowel sounds active in all 4 quadrants, non-tender, non-distended, Extremities: warm/dry, no edema  Skin: no rashes or lesions  Resolved Hospital Problem List:    Assessment & Plan:   Acute R thalamic ICH with surrounding cerebral edema and mild mass effect, likely hypertensive -CT Head demonstrated acute intraparenchymal hemorrhage centered at the right thalamus (estimated volume 54mL) small volume intraventricular extension with blood in the third and fourth ventricles, no hydrocephalus. Mild mass effect of 1-17mm right to left midline shift. Possibility of mass not ruled out, less likely HT of ischemic CVA per Neuro.  Focal severe distal left P2 stenosis  P: Management per neurology  Maintain neuro protective measures; goal for eurothermia, euglycemia, eunatermia, normoxia, and PCO2 goal of 35-40 Nutrition and bowel regiment  Seizure precautions  Aspirations precautions  SBP goal per neurology  Frequent neuro checks   Sedation needs: D/C fentanyl gtt. Order PRN fentanyl. Propofol on hold currently for SBT, when needed use for RASS 0/-1. Caution if decision to use precedex instead given periods of bradycardia  Respiratory insufficiency in the setting of ICH P: Vent Support > wean as tolerated (placed on CPAP 5/5 this AM)  Wean PEEP and FiO2 for sats greater than 90%. Head of bed elevated 30 degrees. Plateau pressures less than 30 cm H20.  Follow intermittent chest x-ray and ABG.   Ensure adequate pulmonary hygiene  VAP bundle in place  PAD protocol  Paroxysmal AF Ectopic atrial rhythm -S/p ablation. Patient endorses use of Eliquis PTA -Andexxa administered in the setting of ICH Hypertension -Home medication includes; Cozaar, and Toprol XL P: Cardiac Monitoring  SBP <160. Hydralazine/Labetalol PRN  Continue home metoprolol. Continue to hold home propafenone for now  Hold home Eliquis Will need ongoing discussion regarding risk vs benefit for ongoing AC  Hypokalemia  P:  Replacing now Trend BMP   GERD P:  Continue home PPI   Best Practice: (right click and "Reselect all SmartList Selections" daily)   Per Primary Team  Critical care time: 36 minutes    Jovita Kussmaul, AGACNP-BC [facility]  PCCM Pgr: [phone]

## 2021-09-27 NOTE — Progress Notes (Signed)
Carotid duplex bilateral study completed.   Please see CV Proc for preliminary results.   Elieser Tetrick, RDMS, RVT  

## 2021-09-27 NOTE — Procedures (Signed)
Extubation Procedure Note  Patient Details:   Name: Tessi Eustache DOB: 05/08/1955 MRN: [id number]   Airway Documentation:    Vent end date: 09/27/21 Vent end time: 1126   Evaluation  O2 sats: stable throughout Complications: No apparent complications Patient did tolerate procedure well. Bilateral Breath Sounds: Clear   Yes  No stridor noted. Cuff test positive.  Bary Limbach 09/27/2021, 11:27 AM

## 2021-09-27 NOTE — Progress Notes (Signed)
  [facility] Adult ICU Electrolyte Replacement Protocol Guidance  Summary: *ONLY for AM lab replacement - For PCCM use only  Pharmacist may utilize for electrolyte replacement *Prefer oral over intravenous replacement whenever possible.  Patient EXCLUSIONS: TCTS patients ECMO patients and patients on Hypothermia Protocol Call MD STAT for K+ <= 2.5, Phos </= 1, or Mag </= 1 GFR < 30 mL/min  SCr >2 or increases >= 0.5 mg/dL over 24 hours Weight < 40 kg Hemodialysis patients Patient has already received replacement for current lab.   K = 3.2   Plan: Give Kcl 40 meq PT q4h x2 F/u K with AM labs  Lissa Merlin, PharmD PGY1 Acute Care Pharmacy Resident  Phone: [phone] 09/27/2021  7:20 AM  Please check [email or web address] for unit-specific pharmacy phone numbers.

## 2021-09-27 NOTE — Progress Notes (Signed)
STROKE TEAM PROGRESS NOTE   SUBJECTIVE (INTERVAL HISTORY) Daughter and RN at the bedside. Pt still intubated but awake alert, following all simple commands, pending extubation today. MRI and MRA completed.    OBJECTIVE Temp:  [98.8 F (37.1 C)-100 F (37.8 C)] 99 F (37.2 C) (06/15 0404) Pulse Rate:  [49-90] 58 (06/15 0800) Cardiac Rhythm: Normal sinus rhythm;Sinus bradycardia (06/14 2000) Resp:  [11-18] 18 (06/15 0800) BP: (110-149)/(52-71) 138/61 (06/15 0800) SpO2:  [95 %-100 %] 100 % (06/15 0800) FiO2 (%):  [35 %-40 %] 35 % (06/15 0800)  Recent Labs  Lab 09/25/21 1439  GLUCAP 108*   Recent Labs  Lab 09/25/21 1442 09/25/21 2236 09/25/21 2321 09/26/21 0424 09/26/21 1001 09/27/21 0534  NA 138 141 143 144 145 145  K 3.2*  --  4.1  --  3.9 3.2*  CL 103  --   --   --  117* 114*  CO2 23  --   --   --  21* 23  GLUCOSE 113*  --   --   --  111* 100*  BUN 13  --   --   --  13 14  CREATININE 0.66  --   --   --  0.64 0.59  CALCIUM 9.4  --   --   --  8.4* 8.8*   Recent Labs  Lab 09/25/21 1442  AST 20  ALT 16  ALKPHOS 78  BILITOT 0.7  PROT 7.7  ALBUMIN 4.3   Recent Labs  Lab 09/25/21 1442 09/25/21 2321 09/27/21 0534  WBC 8.8  --  8.7  NEUTROABS 4.7  --   --   HGB 12.0 9.9* 10.3*  HCT 37.4 29.0* 32.1*  MCV 91.4  --  94.1  PLT 273  --  190   No results for input(s): "CKTOTAL", "CKMB", "CKMBINDEX", "TROPONINI" in the last 168 hours. Recent Labs    09/25/21 1442  LABPROT 14.8  INR 1.2   Recent Labs    09/25/21 1559  COLORURINE YELLOW*  LABSPEC 1.010  PHURINE 6.0  GLUCOSEU NEGATIVE  HGBUR NEGATIVE  BILIRUBINUR NEGATIVE  KETONESUR 5*  PROTEINUR NEGATIVE  NITRITE NEGATIVE  LEUKOCYTESUR NEGATIVE       Component Value Date/Time   CHOL 211 (H) 09/27/2021 0534   TRIG 180 (H) 09/27/2021 0534   HDL 41 09/27/2021 0534   CHOLHDL 5.1 09/27/2021 0534   VLDL 36 09/27/2021 0534   LDLCALC 134 (H) 09/27/2021 0534   Lab Results  Component Value Date    HGBA1C 5.9 (H) 09/25/2021   No results found for: "LABOPIA", "COCAINSCRNUR", "LABBENZ", "AMPHETMU", "THCU", "LABBARB"  No results for input(s): "ETH" in the last 168 hours.  I have personally reviewed the radiological images below and agree with the radiology interpretations.  MR BRAIN WO CONTRAST  Result Date: 09/27/2021 CLINICAL DATA:  Follow-up examination for hemorrhagic stroke. EXAM: MRI HEAD WITHOUT CONTRAST MRA HEAD WITHOUT CONTRAST TECHNIQUE: Multiplanar, multi-echo pulse sequences of the brain and surrounding structures were acquired without intravenous contrast. Angiographic images of the Circle of Willis were acquired using MRA technique without intravenous contrast. COMPARISON:  Prior CT from 09/25/2021. FINDINGS: MRI HEAD FINDINGS Brain: Cerebral volume within normal limits. Scattered patchy T2/FLAIR hyperintensity involving the periventricular deep white matter both cerebral hemispheres, most consistent with chronic small vessel ischemic disease, moderately advanced in nature. Previously identified acute intraparenchymal hemorrhage centered at the right thalamus again seen, not significantly changed in size or morphology as compared to prior CT. Lesion measures approximately 2.9  x 1.6 x 2.2 cm by MRI. Surrounding T2/FLAIR signal consistent with edema, mildly increased from prior. Edema extends inferiorly into the right cerebral peduncle/midbrain. Mild localized right-to-left shift. Intraventricular extension with small volume intraventricular hemorrhage, similar as well. No hydrocephalus or trapping. No other evidence for acute or subacute ischemia. Focus of mild diffusion signal abnormality at the posterior left frontal centrum semi ovale felt to be related to susceptibility artifact (series 5, image 87). No other acute intracranial hemorrhage. Few scattered chronic micro hemorrhages noted, likely hypertensive in nature. No mass lesion or extra-axial fluid collection. Pituitary gland  suprasellar region within normal limits. Vascular: Major intracranial vascular flow voids are maintained. Skull and upper cervical spine: Craniocervical junction within normal limits. Bone marrow signal intensity normal. Small lipoma at the right frontal scalp noted. Scalp soft tissues otherwise unremarkable. Sinuses/Orbits: Globes orbital soft tissues within normal limits. Mild scattered mucosal thickening noted about the ethmoidal air cells. Few small left maxillary sinus retention cyst noted. Trace right mastoid effusion. Patient is intubated. Other: None. MRA HEAD FINDINGS Anterior circulation: Visualized distal cervical segments of the internal carotid arteries are patent with antegrade flow. Distal cervical right ICA is tortuous. Petrous, cavernous, supraclinoid segments patent without stenosis or other abnormality. A1 segments patent bilaterally. Normal anterior communicating artery complex. Anterior cerebral arteries patent without stenosis. No M1 stenosis or occlusion. No proximal MCA branch occlusion. Distal MCA branches perfused and symmetric. Posterior circulation: Both vertebral arteries patent without stenosis. Left vertebral artery slightly dominant. Both PICA patent. Basilar patent to its distal aspect without stenosis. Short-segment fenestration involving distal basilar artery just prior to the apex noted. Superior cerebellar arteries patent bilaterally. Both PCA supplied via the basilar as well as small bilateral posterior communicating arteries. Right PCA widely patent to its distal aspect. Focal severe distal left P2 stenosis noted (series 1033, image 4). Left PCA otherwise patent as well. Anatomic variants: None significant. No intracranial aneurysm. No other vascular malformation seen underlying the right thalamic hemorrhage. IMPRESSION: MRI HEAD IMPRESSION: 1. Unchanged size and morphology of acute intraparenchymal hemorrhage centered at the right thalamus. Surrounding edema and regional mass  effect has mildly increased as compared to prior CT from 09/25/2021. 2. Intraventricular extension with small volume intraventricular hemorrhage, similar. No hydrocephalus or trapping. 3. Underlying moderate chronic microvascular ischemic disease. MRA HEAD IMPRESSION: 1. Negative intracranial MRA for large vessel occlusion. 2. Focal severe distal left P2 stenosis. 3. Otherwise negative intracranial MRA. No other hemodynamically significant or correctable stenosis. No aneurysm or other vascular malformation seen underlying the right thalamic hemorrhage. Electronically Signed   By: Rise Mu M.D.   On: 09/27/2021 06:06   MR ANGIO HEAD WO CONTRAST  Result Date: 09/27/2021 CLINICAL DATA:  Follow-up examination for hemorrhagic stroke. EXAM: MRI HEAD WITHOUT CONTRAST MRA HEAD WITHOUT CONTRAST TECHNIQUE: Multiplanar, multi-echo pulse sequences of the brain and surrounding structures were acquired without intravenous contrast. Angiographic images of the Circle of Willis were acquired using MRA technique without intravenous contrast. COMPARISON:  Prior CT from 09/25/2021. FINDINGS: MRI HEAD FINDINGS Brain: Cerebral volume within normal limits. Scattered patchy T2/FLAIR hyperintensity involving the periventricular deep white matter both cerebral hemispheres, most consistent with chronic small vessel ischemic disease, moderately advanced in nature. Previously identified acute intraparenchymal hemorrhage centered at the right thalamus again seen, not significantly changed in size or morphology as compared to prior CT. Lesion measures approximately 2.9 x 1.6 x 2.2 cm by MRI. Surrounding T2/FLAIR signal consistent with edema, mildly increased from prior. Edema extends inferiorly into  the right cerebral peduncle/midbrain. Mild localized right-to-left shift. Intraventricular extension with small volume intraventricular hemorrhage, similar as well. No hydrocephalus or trapping. No other evidence for acute or subacute  ischemia. Focus of mild diffusion signal abnormality at the posterior left frontal centrum semi ovale felt to be related to susceptibility artifact (series 5, image 87). No other acute intracranial hemorrhage. Few scattered chronic micro hemorrhages noted, likely hypertensive in nature. No mass lesion or extra-axial fluid collection. Pituitary gland suprasellar region within normal limits. Vascular: Major intracranial vascular flow voids are maintained. Skull and upper cervical spine: Craniocervical junction within normal limits. Bone marrow signal intensity normal. Small lipoma at the right frontal scalp noted. Scalp soft tissues otherwise unremarkable. Sinuses/Orbits: Globes orbital soft tissues within normal limits. Mild scattered mucosal thickening noted about the ethmoidal air cells. Few small left maxillary sinus retention cyst noted. Trace right mastoid effusion. Patient is intubated. Other: None. MRA HEAD FINDINGS Anterior circulation: Visualized distal cervical segments of the internal carotid arteries are patent with antegrade flow. Distal cervical right ICA is tortuous. Petrous, cavernous, supraclinoid segments patent without stenosis or other abnormality. A1 segments patent bilaterally. Normal anterior communicating artery complex. Anterior cerebral arteries patent without stenosis. No M1 stenosis or occlusion. No proximal MCA branch occlusion. Distal MCA branches perfused and symmetric. Posterior circulation: Both vertebral arteries patent without stenosis. Left vertebral artery slightly dominant. Both PICA patent. Basilar patent to its distal aspect without stenosis. Short-segment fenestration involving distal basilar artery just prior to the apex noted. Superior cerebellar arteries patent bilaterally. Both PCA supplied via the basilar as well as small bilateral posterior communicating arteries. Right PCA widely patent to its distal aspect. Focal severe distal left P2 stenosis noted (series 1033, image  4). Left PCA otherwise patent as well. Anatomic variants: None significant. No intracranial aneurysm. No other vascular malformation seen underlying the right thalamic hemorrhage. IMPRESSION: MRI HEAD IMPRESSION: 1. Unchanged size and morphology of acute intraparenchymal hemorrhage centered at the right thalamus. Surrounding edema and regional mass effect has mildly increased as compared to prior CT from 09/25/2021. 2. Intraventricular extension with small volume intraventricular hemorrhage, similar. No hydrocephalus or trapping. 3. Underlying moderate chronic microvascular ischemic disease. MRA HEAD IMPRESSION: 1. Negative intracranial MRA for large vessel occlusion. 2. Focal severe distal left P2 stenosis. 3. Otherwise negative intracranial MRA. No other hemodynamically significant or correctable stenosis. No aneurysm or other vascular malformation seen underlying the right thalamic hemorrhage. Electronically Signed   By: Jeannine Boga M.D.   On: 09/27/2021 06:06   DG Pelvis Portable  Result Date: 09/26/2021 CLINICAL DATA:  MRI screening, evaluate for metal. EXAM: PORTABLE PELVIS 1-2 VIEWS COMPARISON:  None Available. FINDINGS: There is no evidence for metallic foreign body. Phleboliths are noted in the pelvis. No dilated bowel loops. No acute fractures. IMPRESSION: No evidence for metallic foreign body in the pelvis or lower abdomen. Electronically Signed   By: Ronney Asters M.D.   On: 09/26/2021 19:28   ECHOCARDIOGRAM COMPLETE  Result Date: 09/26/2021    ECHOCARDIOGRAM REPORT   Patient Name:   MAYTAL CROLEY Date of Exam: 09/26/2021 Medical Rec #:  [id number]        Height: Accession #:    [id number]       Weight:       181.0 lb Date of Birth:  1955/11/16        BSA:          1.889 m Patient Age:    70 years  BP:           134/90 mmHg Patient Gender: F                HR:           62 bpm. Exam Location:  Inpatient Procedure: 2D Echo, Cardiac Doppler and Color Doppler                                MODIFIED REPORT: This report was modified by Dietrich Pates MD on 09/26/2021 due to Report review.  Indications:     Stroke  History:         Patient has prior history of Echocardiogram examinations, most                  recent 12/03/2019. Aortic Valve Disease, Arrythmias:Atrial                  Fibrillation; Risk Factors:Hypertension. Previous echo done at                  [facility].  Sonographer:     Rodrigo Ran RCS Referring Phys:  [id number] ERIC LINDZEN Diagnosing Phys: Dietrich Pates MD IMPRESSIONS  1. Left ventricular ejection fraction, by estimation, is 65 to 70%. The left ventricle has normal function. The left ventricle has no regional wall motion abnormalities. Left ventricular diastolic parameters were normal.  2. Right ventricular systolic function is normal. The right ventricular size is normal.  3. The mitral valve is normal in structure. Mild mitral valve regurgitation.  4. The aortic valve is normal in structure. Aortic valve regurgitation is trivial. Aortic valve sclerosis is present, with no evidence of aortic valve stenosis.  5. There is mild dilatation of the aortic root, measuring 40 mm.  6. The inferior vena cava is dilated in size with <50% respiratory variability, suggesting right atrial pressure of 15 mmHg. FINDINGS  Left Ventricle: Left ventricular ejection fraction, by estimation, is 65 to 70%. The left ventricle has normal function. The left ventricle has no regional wall motion abnormalities. The left ventricular internal cavity size was normal in size. There is  no left ventricular hypertrophy. Left ventricular diastolic parameters were normal. Right Ventricle: The right ventricular size is normal. Right vetricular wall thickness was not assessed. Right ventricular systolic function is normal. Left Atrium: Left atrial size was normal in size. Right Atrium: Right atrial size was normal in size. Pericardium: Trivial pericardial effusion is present. Mitral Valve: The mitral valve is normal  in structure. Mild mitral valve regurgitation. Tricuspid Valve: The tricuspid valve is normal in structure. Tricuspid valve regurgitation is trivial. Aortic Valve: The aortic valve is normal in structure. Aortic valve regurgitation is trivial. Aortic regurgitation PHT measures 683 msec. Aortic valve sclerosis is present, with no evidence of aortic valve stenosis. Aortic valve mean gradient measures 5.0 mmHg. Aortic valve peak gradient measures 9.4 mmHg. Aortic valve area, by VTI measures 2.81 cm. Pulmonic Valve: The pulmonic valve was normal in structure. Pulmonic valve regurgitation is trivial. Aorta: The aortic root and ascending aorta are structurally normal, with no evidence of dilitation. There is mild dilatation of the aortic root, measuring 40 mm. Venous: The inferior vena cava is dilated in size with less than 50% respiratory variability, suggesting right atrial pressure of 15 mmHg. IAS/Shunts: No atrial level shunt detected by color flow Doppler.  LEFT VENTRICLE PLAX 2D LVIDd:  4.40 cm   Diastology LVIDs:         2.80 cm   LV e' medial:    8.73 cm/s LV PW:         0.90 cm   LV E/e' medial:  12.9 LV IVS:        1.00 cm   LV e' lateral:   11.30 cm/s LVOT diam:     2.30 cm   LV E/e' lateral: 10.0 LV SV:         99 LV SV Index:   53 LVOT Area:     4.15 cm  RIGHT VENTRICLE             IVC RV Basal diam:  3.90 cm     IVC diam: 2.20 cm RV Mid diam:    2.70 cm RV S prime:     15.30 cm/s TAPSE (M-mode): 2.1 cm LEFT ATRIUM             Index        RIGHT ATRIUM           Index LA diam:        3.20 cm 1.69 cm/m   RA Area:     15.40 cm LA Vol (A2C):   54.1 ml 28.64 ml/m  RA Volume:   36.30 ml  19.21 ml/m LA Vol (A4C):   43.2 ml 22.87 ml/m LA Biplane Vol: 50.5 ml 26.73 ml/m  AORTIC VALVE                     PULMONIC VALVE AV Area (Vmax):    3.18 cm      PV Vmax:          0.95 m/s AV Area (Vmean):   2.86 cm      PV Peak grad:     3.6 mmHg AV Area (VTI):     2.81 cm      PR End Diast Vel: 3.65 msec AV  Vmax:           153.00 cm/s AV Vmean:          110.000 cm/s AV VTI:            0.353 m AV Peak Grad:      9.4 mmHg AV Mean Grad:      5.0 mmHg LVOT Vmax:         117.00 cm/s LVOT Vmean:        75.600 cm/s LVOT VTI:          0.239 m LVOT/AV VTI ratio: 0.68 AI PHT:            683 msec  AORTA Ao Root diam: 4.00 cm Ao Asc diam:  3.00 cm MITRAL VALVE                TRICUSPID VALVE MV Area (PHT): 4.26 cm     TR Peak grad:   25.0 mmHg MV Decel Time: 178 msec     TR Vmax:        250.00 cm/s MV E velocity: 113.00 cm/s MV A velocity: 99.50 cm/s   SHUNTS MV E/A ratio:  1.14         Systemic VTI:  0.24 m                             Systemic Diam: 2.30 cm Dorris Carnes MD Electronically signed by Dorris Carnes MD Signature Date/Time:  09/26/2021/2:49:05 PM    Final (Updated)    DG Abd 1 View  Result Date: 09/25/2021 CLINICAL DATA:  OG tube placement EXAM: ABDOMEN - 1 VIEW COMPARISON:  09/25/2021 FINDINGS: Esophageal tube tip overlies the stomach, side port probably in the region of GE junction. Possible dilated segment of small bowel in the central abdomen. There is gas present in the colon IMPRESSION: Esophageal tube tip overlies the stomach, side-port in the region of GE junction, further advancement suggested for more optimal positioning Electronically Signed   By: Donavan Foil M.D.   On: 09/25/2021 23:27   CT HEAD WO CONTRAST (5MM)  Result Date: 09/25/2021 CLINICAL DATA:  Follow-up examination for intracranial hemorrhage. EXAM: CT HEAD WITHOUT CONTRAST TECHNIQUE: Contiguous axial images were obtained from the base of the skull through the vertex without intravenous contrast. RADIATION DOSE REDUCTION: This exam was performed according to the departmental dose-optimization program which includes automated exposure control, adjustment of the mA and/or kV according to patient size and/or use of iterative reconstruction technique. COMPARISON:  Prior CT from earlier the same day. FINDINGS: Brain: Previously identified acute  intraparenchymal hemorrhage centered at the right thalamus again seen, not significantly changed in size measuring 2.2 x 1.5 x 2.3 cm (estimated volume 4 mL, stable when measured in a similar fashion). Mild inferior extension towards the right cerebral peduncle. Intraventricular extension with small volume hemorrhage within the third and fourth ventricles, stable. No hydrocephalus or trapping. No other new acute intracranial hemorrhage. No acute large vessel territory infarct. Scattered hypodensity involving the supratentorial cerebral white matter noted, nonspecific, but most likely related chronic microvascular ischemic disease. No mass lesion or significant midline shift. No extra-axial fluid collection. Vascular: No hyperdense vessel. Skull: Scalp soft tissues and calvarium within normal limits. Few scattered foci of soft tissue emphysema within the left temporal region likely related IV access. Sinuses/Orbits: Globes and orbital soft tissues within normal limits. Scattered mucosal thickening noted about the ethmoidal air cells. Small retention cysts versus polyps noted within the visualized left maxillary sinus. Mastoid air cells are clear. Other: None. IMPRESSION: 1. No significant interval change in size of acute intraparenchymal hemorrhage centered at the right thalamus, estimated volume 4 mL. Small volume intraventricular extension with blood in the third and fourth ventricles. No hydrocephalus or trapping. 2. No other new acute intracranial abnormality. Electronically Signed   By: Jeannine Boga M.D.   On: 09/25/2021 21:26   DG Abdomen 1 View  Result Date: 09/25/2021 CLINICAL DATA:  Placement of NG tube EXAM: ABDOMEN - 1 VIEW COMPARISON:  None Available. FINDINGS: Distal portion of NG tube is coiled within the fundus of the stomach with its tip pointing coracoid in the region of body of the stomach. Visualized bowel gas pattern is unremarkable. There are linear densities in the lower lung fields  suggesting scarring or subsegmental atelectasis. IMPRESSION: Tip of NG tube is seen in the stomach. Electronically Signed   By: Elmer Picker M.D.   On: 09/25/2021 15:50   DG Chest 1 View  Result Date: 09/25/2021 CLINICAL DATA:  Provided history: Intubation. Status post intubation and OG tube placement. EXAM: CHEST  1 VIEW COMPARISON:  Concurrently performed abdominal radiograph 09/25/2021. FINDINGS: ET tube present with tip terminating 2.2 cm above the level of the carina. An enteric tube passes below level of left hemidiaphragm and is coiled within the stomach. Heart size within normal limits. Aortic atherosclerosis. Mild bibasilar atelectasis. No appreciable airspace consolidation. No evidence of pleural effusion or pneumothorax. No acute bony  abnormality identified. IMPRESSION: Support apparatus, as described. Mild bibasilar atelectasis. Aortic Atherosclerosis (ICD10-I70.0). Electronically Signed   By: Kellie Simmering D.O.   On: 09/25/2021 15:47   CT HEAD CODE STROKE WO CONTRAST  Result Date: 09/25/2021 CLINICAL DATA:  Code stroke.  Stroke suspected, no history given EXAM: CT HEAD WITHOUT CONTRAST TECHNIQUE: Contiguous axial images were obtained from the base of the skull through the vertex without intravenous contrast. RADIATION DOSE REDUCTION: This exam was performed according to the departmental dose-optimization program which includes automated exposure control, adjustment of the mA and/or kV according to patient size and/or use of iterative reconstruction technique. COMPARISON:  None Available. FINDINGS: Brain: Hemorrhage centered in the right thalamus and basal ganglia, which measures approximally 1.3 x 2.8 x 2.2 cm (AP x TR x CC) (series 4, image 13 and series 6, image 24). Surrounding hypodensity, likely mild edema. Hemorrhage is also noted in the third ventricle, cerebral aqueduct, and fourth ventricle. Mild local mass effect 1-2 mm of right-to-left midline shift midline shift. No evidence of  acute cortical infarct, hydrocephalus, or extra-axial collection. Vascular: No hyperdense vessel. Skull: No acute osseous abnormality. Sinuses/Orbits: No acute finding. Other: The mastoids are well aerated. IMPRESSION: Hemorrhage centered in the right thalamus and basal ganglia, with mild surrounding edema, minimal midline shift, and a small amount of intraventricular extension into the third and fourth ventricles. Code stroke imaging results were communicated on 09/25/2021 at 2:53 pm to provider BHAGAT via secure text paging. Electronically Signed   By: Merilyn Baba M.D.   On: 09/25/2021 14:56     PHYSICAL EXAM  Temp:  [98.8 F (37.1 C)-100 F (37.8 C)] 99 F (37.2 C) (06/15 0404) Pulse Rate:  [49-90] 58 (06/15 0800) Resp:  [11-18] 18 (06/15 0800) BP: (110-149)/(52-71) 138/61 (06/15 0800) SpO2:  [95 %-100 %] 100 % (06/15 0800) FiO2 (%):  [35 %-40 %] 35 % (06/15 0800)  General - Well nourished, well developed, intubated on low-dose sedation.  Ophthalmologic - fundi not visualized due to noncooperation.  Cardiovascular - Regular rate and rhythm, not in A-fib.  Neuro - intubated on low-dose fentanyl, eyes open on voice, following all simple commands. Eyes in need position, visual field full, left eye abduction incomplete, bilateral tracking, PERRL. Corneal reflex present, gag and cough present. Breathing over the vent.  Facial symmetry not able to test due to ET tube.  Tongue protrusion not cooperative. On pain stimulation, RUE and RLE at least 4+/5, however, left upper and lower extremity flaccid. DTR 1+ and no babinski. Sensation decreased on the left, coordination intact on the right FTN and gait not tested.   ASSESSMENT/PLAN Ms. Laquia Wimpy is a 66 y.o. female with history of hypertension, PAF on Eliquis admitted for left-sided weakness. No tPA given due to [place].    ICH:  right thalamic ICH with small IVH, likely secondary to hypertension in the setting of Eliquis use CT head ICH  centered in the right thalamus in the basal ganglia, minimal midline shift, small amount of IVH CT repeat stable ICH and IVH MRI stable hematoma and IVH, mildly increase mass effect, no CAA MRA no AVM or aneurysm. Focal severe distal left P2 stenosis Carotid Doppler pending 2D Echo EF 65 to 70% LDL 134 HgbA1c 5.9 SCDs for VTE prophylaxis Eliquis (apixaban) daily prior to admission, now on No antithrombotic given ICH  Ongoing aggressive stroke risk factor management Therapy recommendations: Pending Disposition: Pending  Respiratory failure Intubated for airway protection On weaning Not extubated yesterday due to  Nausea Plan to extubate today  PAF On Eliquis Was switched to Xarelto but not started yet Reversed with Andexxa Rate controlled currently Not on antithrombotics now given ICH Discussed with daughter about ASPIRE trial, she is considering  Hypertension Home meds losartan, metoprolol XR 50 Stable Off Cleviprex now On home losartan 50, metoprolol 25 twice daily BP goal less than 160 Long term BP goal normotensive  Hyperlipidemia Home meds: None LDL 134, goal < 70 Will consider statin on discharge  Other Stroke Risk Factors Advanced age  Other [facility] day # 2  This patient is critically ill due to [place] with IVH, hypertensive emergency, respiratory failure and at significant risk of neurological worsening, death form hematoma expansion, brain edema, herniation. This patient's care requires constant monitoring of vital signs, hemodynamics, respiratory and cardiac monitoring, review of multiple databases, neurological assessment, discussion with family, other specialists and medical decision making of high complexity. I spent 35 minutes of neurocritical care time in the care of this patient. I had long discussion with daughter at bedside, updated pt current condition, treatment plan and potential prognosis, and answered all the questions.  She  expressed understanding and appreciation.    Rosalin Hawking, MD PhD Stroke Neurology 09/27/2021 8:14 AM    To contact Stroke Continuity provider, please refer to [email or web address] After hours, contact General Neurology

## 2021-09-27 NOTE — Progress Notes (Signed)
Pt transported from 4N21 on vent to MRI and placed on MRI vent w/same settings as per flowsheet w/out incidence. Pt returned from MRI to 4N21 on vent w/same settings as per flowsheet w/out incidence. Pt tol transport and MRI well.

## 2021-09-27 NOTE — Evaluation (Signed)
Physical Therapy Evaluation Patient Details Name: Charlotte Hunter MRN: [id number] DOB: 1955-08-02 Today's Date: 09/27/2021  History of Present Illness  66 y.o. female presents to [facility] hospital on 09/25/2021 with L weakness. 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  Pt required intubation 6/13. PMH includes HTN, PAF, GERD, UTI.  Clinical Impression  Pt presents to PT with deficits in strength, power, endurance, gait, balance, cognition, functional mobility. Pt with left lateral lean, losing balance multiple times and requiring intermittent verbal cues from PT to prevent potential falls. Pt requires significant physical assistance to perform bed mobility and to transfer. Pt will benefit from aggressive mobilization in an effort to improve balance and reduce falls risk. PT provides education on the need for frequent attempts and LUE and LLE mobility. PT recommends discharge to AIR when medically ready.       Recommendations for follow up therapy are one component of a multi-disciplinary discharge planning process, led by the attending physician.  Recommendations may be updated based on patient status, additional functional criteria and insurance authorization.  Follow Up Recommendations Acute inpatient rehab (3hours/day)    Assistance Recommended at Discharge Frequent or constant Supervision/Assistance  Patient can return home with the following  Two people to help with walking and/or transfers;Two people to help with bathing/dressing/bathroom;Assistance with cooking/housework;Assistance with feeding;Direct supervision/assist for medications management;Direct supervision/assist for financial management;Assist for transportation;Help with stairs or ramp for entrance    Equipment Recommendations Wheelchair (measurements PT);Wheelchair cushion (measurements PT);Hospital bed;BSC/3in1  Recommendations for Other Services  Rehab consult    Functional Status Assessment Patient has had a recent  decline in their functional status and demonstrates the ability to make significant improvements in function in a reasonable and predictable amount of time.     Precautions / Restrictions Precautions Precautions: Fall Precaution Comments: L hemiplegia Restrictions Weight Bearing Restrictions: No      Mobility  Bed Mobility Overal bed mobility: Needs Assistance Bed Mobility: Supine to Sit     Supine to sit: Mod assist, HOB elevated          Transfers Overall transfer level: Needs assistance Equipment used: 1 person hand held assist Transfers: Sit to/from Stand, Bed to chair/wheelchair/BSC Sit to Stand: Mod assist Stand pivot transfers: Max assist         General transfer comment: PT provides L knee block and UE support. Pt requires max facilitation of pivot. Pt stands 3 times, incontinent of urine each time    Ambulation/Gait                  Stairs            Wheelchair Mobility    Modified Rankin (Stroke Patients Only)       Balance Overall balance assessment: Needs assistance Sitting-balance support: No upper extremity supported, Feet supported, Single extremity supported Sitting balance-Leahy Scale: Poor Sitting balance - Comments: initially with mod-maxA due to left lean, pt requires verbal cues to hold rail or utilize shift of center of mass to maintain balance with minG-minA Postural control: Left lateral lean Standing balance support: Single extremity supported Standing balance-Leahy Scale: Poor Standing balance comment: maxA, left lean                             Pertinent Vitals/Pain Pain Assessment Pain Assessment: Faces Faces Pain Scale: Hurts even more Pain Location: chest, neck Pain Descriptors / Indicators: Grimacing Pain Intervention(s): Monitored during session    Home Living Family/patient  expects to be discharged to:: Private residence Living Arrangements: Children (pt reports her daughter lives with her,  nurse reporting the daughter lives in [place]) Available Help at Discharge: Family;Available PRN/intermittently Type of Home: House Home Access: Stairs to enter Entrance Stairs-Rails: Can reach both Entrance Stairs-Number of Steps: 4   Home Layout: One level Home Equipment: None      Prior Function Prior Level of Function : Independent/Modified Independent;Driving             Mobility Comments: enjoys gardening       Hand Dominance        Extremity/Trunk Assessment   Upper Extremity Assessment Upper Extremity Assessment: LUE deficits/detail LUE Deficits / Details: PROM WFL, flaccid    Lower Extremity Assessment Lower Extremity Assessment: LLE deficits/detail LLE Deficits / Details: PROM WFL, flaccid    Cervical / Trunk Assessment Cervical / Trunk Assessment: Kyphotic  Communication   Communication: No difficulties  Cognition Arousal/Alertness: Awake/alert Behavior During Therapy: Flat affect Overall Cognitive Status: Impaired/Different from baseline Area of Impairment: Orientation, Attention, Following commands, Safety/judgement, Awareness, Problem solving                 Orientation Level: Disoriented to, Time (oriented to month) Current Attention Level: Sustained   Following Commands: Follows one step commands with increased time Safety/Judgement: Decreased awareness of safety, Decreased awareness of deficits Awareness: Intellectual Problem Solving: Slow processing, Difficulty sequencing, Requires verbal cues, Requires tactile cues          General Comments General comments (skin integrity, edema, etc.): VSS on RA    Exercises     Assessment/Plan    PT Assessment Patient needs continued PT services  PT Problem List Decreased strength;Decreased activity tolerance;Decreased balance;Decreased mobility;Decreased cognition;Decreased knowledge of use of DME;Decreased safety awareness;Decreased knowledge of precautions;Pain       PT Treatment  Interventions DME instruction;Gait training;Stair training;Functional mobility training;Therapeutic activities;Therapeutic exercise;Balance training;Neuromuscular re-education;Patient/family education    PT Goals (Current goals can be found in the Care Plan section)  Acute Rehab PT Goals Patient Stated Goal: to improve mobility quality in an effort to return to independence PT Goal Formulation: With patient Time For Goal Achievement: 10/11/21 Potential to Achieve Goals: Fair    Frequency Min 4X/week     Co-evaluation               AM-PAC PT "6 Clicks" Mobility  Outcome Measure Help needed turning from your back to your side while in a flat bed without using bedrails?: A Little Help needed moving from lying on your back to sitting on the side of a flat bed without using bedrails?: A Lot Help needed moving to and from a bed to a chair (including a wheelchair)?: A Lot Help needed standing up from a chair using your arms (e.g., wheelchair or bedside chair)?: A Lot Help needed to walk in hospital room?: Total Help needed climbing 3-5 steps with a railing? : Total 6 Click Score: 11    End of Session   Activity Tolerance: Patient tolerated treatment well Patient left: in chair;with call bell/phone within reach;with chair alarm set Nurse Communication: Mobility status;Need for lift equipment PT Visit Diagnosis: Other abnormalities of gait and mobility (R26.89);Other symptoms and signs involving the nervous system (R29.898);Hemiplegia and hemiparesis Hemiplegia - Right/Left: Left Hemiplegia - dominant/non-dominant: Non-dominant Hemiplegia - caused by: Nontraumatic intracerebral hemorrhage    Time: 2353-6144 PT Time Calculation (min) (ACUTE ONLY): 33 min   Charges:   PT Evaluation $PT Eval Low Complexity: 1 Low  PT Treatments $Therapeutic Activity: 8-22 mins        Arlyss Gandy, PT, DPT Acute Rehabilitation Office [phone]   Arlyss Gandy 09/27/2021, 4:18 PM

## 2021-09-28 ENCOUNTER — Inpatient Hospital Stay (HOSPITAL_COMMUNITY): Payer: Medicare Other

## 2021-09-28 DIAGNOSIS — I161 Hypertensive emergency: Secondary | ICD-10-CM | POA: Diagnosis not present

## 2021-09-28 DIAGNOSIS — E785 Hyperlipidemia, unspecified: Secondary | ICD-10-CM

## 2021-09-28 DIAGNOSIS — G8194 Hemiplegia, unspecified affecting left nondominant side: Secondary | ICD-10-CM

## 2021-09-28 DIAGNOSIS — Z7901 Long term (current) use of anticoagulants: Secondary | ICD-10-CM | POA: Diagnosis not present

## 2021-09-28 DIAGNOSIS — I480 Paroxysmal atrial fibrillation: Secondary | ICD-10-CM | POA: Diagnosis not present

## 2021-09-28 DIAGNOSIS — R202 Paresthesia of skin: Secondary | ICD-10-CM

## 2021-09-28 DIAGNOSIS — I10 Essential (primary) hypertension: Secondary | ICD-10-CM | POA: Diagnosis not present

## 2021-09-28 DIAGNOSIS — I610 Nontraumatic intracerebral hemorrhage in hemisphere, subcortical: Secondary | ICD-10-CM | POA: Diagnosis not present

## 2021-09-28 LAB — BASIC METABOLIC PANEL
Anion gap: 6 (ref 5–15)
BUN: 13 mg/dL (ref 8–23)
CO2: 24 mmol/L (ref 22–32)
Calcium: 8.6 mg/dL — ABNORMAL LOW (ref 8.9–10.3)
Chloride: 111 mmol/L (ref 98–111)
Creatinine, Ser: 0.69 mg/dL (ref 0.44–1.00)
GFR, Estimated: >60 mL/min (ref >60)
Glucose, Bld: 89 mg/dL (ref 70–99)
Potassium: 3.3 mmol/L — ABNORMAL LOW (ref 3.5–5.1)
Sodium: 141 mmol/L (ref 135–145)

## 2021-09-28 LAB — CBC
HCT: 32.2 % — ABNORMAL LOW (ref 36.0–46.0)
Hemoglobin: 10.3 g/dL — ABNORMAL LOW (ref 12.0–15.0)
MCH: 30 pg (ref 26.0–34.0)
MCHC: 32 g/dL (ref 30.0–36.0)
MCV: 93.9 fL (ref 80.0–100.0)
Platelets: 197 10*3/uL (ref 150–400)
RBC: 3.43 MIL/uL — ABNORMAL LOW (ref 3.87–5.11)
RDW: 14.7 % (ref 11.5–15.5)
WBC: 9.2 10*3/uL (ref 4.0–10.5)
nRBC: 0 % (ref 0.0–0.2)

## 2021-09-28 LAB — PHOSPHORUS: Phosphorus: 1.9 mg/dL — ABNORMAL LOW (ref 2.5–4.6)

## 2021-09-28 LAB — MAGNESIUM: Magnesium: 2.2 mg/dL (ref 1.7–2.4)

## 2021-09-28 MED ORDER — ADULT MULTIVITAMIN W/MINERALS CH
1.0000 | ORAL_TABLET | Freq: Every day | ORAL | Status: DC
  Administered 2021-09-28 – 2021-10-01 (×4): 1 via ORAL
  Filled 2021-09-28 (×3): qty 1

## 2021-09-28 MED ORDER — POLYVINYL ALCOHOL 1.4 % OP SOLN
1.0000 [drp] | OPHTHALMIC | Status: DC | PRN
  Administered 2021-09-28 – 2021-10-01 (×8): 1 [drp] via OPHTHALMIC
  Filled 2021-09-28: qty 15

## 2021-09-28 MED ORDER — DEXTROSE 5 % IV SOLN
30.0000 mmol | Freq: Once | INTRAVENOUS | Status: AC
  Administered 2021-09-28: 30 mmol via INTRAVENOUS
  Filled 2021-09-28: qty 10

## 2021-09-28 NOTE — Progress Notes (Signed)
eLink Physician-Brief Progress Note Patient Name: Charlotte Hunter DOB: 03-04-1956 MRN: [id number]   Date of Service  09/28/2021  HPI/Events of Note  K+ 3.3, Phos 1.9, Cr 0.69.  eICU Interventions  E-Link nurse driven electrolyte replacement protocol ordered.        Thomasene Lot Jackqueline Aquilar 09/28/2021, 5:59 AM

## 2021-09-28 NOTE — Evaluation (Signed)
Clinical/Bedside Swallow Evaluation Patient Details  Name: Charlotte Hunter MRN: [id number] Date of Birth: [birth year]  Today's Date: 09/28/2021 Time: SLP Start Time (ACUTE ONLY): 0854 SLP Stop Time (ACUTE ONLY): 0905 SLP Time Calculation (min) (ACUTE ONLY): 11 min  Past Medical History:  Past Medical History:  Diagnosis Date   A-fib [facility])    Aortic insufficiency    Hypertension    Past Surgical History: The histories are not reviewed yet. Please review them in the "History" navigator section and refresh this SmartLink. HPI:  66 y.o. female presents to [facility] hospital on 09/25/2021 with L weakness. 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  Pt required intubation 6/13-6/15. PMH includes HTN, PAF, GERD, UTI.    Assessment / Plan / Recommendation  Clinical Impression  Pt shows signs of a multifactoral dysphagia given acute stroke as well as brief intubation. She defaults to whispering but can initiation phonation when prompted, although it remains breathy and soft. She has oral holding across consistencies and frequent throat clearing immediate after swallowing. With thin liquids, she self-imposes breaks, reporting that she feels short of breath. She cannot be adequately challenged with larger volumes, but considering signs of dysphagia, would recommend proceeding with MBS anyway. SLP Visit Diagnosis: Dysphagia, unspecified (R13.10)    Aspiration Risk  Moderate aspiration risk    Diet Recommendation NPO (few pieces of ice from RN after oral care pending MBS)   Medication Administration: Via alternative means    Other  Recommendations Oral Care Recommendations: Oral care QID    Recommendations for follow up therapy are one component of a multi-disciplinary discharge planning process, led by the attending physician.  Recommendations may be updated based on patient status, additional functional criteria and insurance authorization.  Follow up Recommendations Acute inpatient rehab  (3hours/day)      Assistance Recommended at Discharge Frequent or constant Supervision/Assistance  Functional Status Assessment Patient has had a recent decline in their functional status and demonstrates the ability to make significant improvements in function in a reasonable and predictable amount of time.  Frequency and Duration            Prognosis Prognosis for Safe Diet Advancement: Good      Swallow Study   General HPI: 66 y.o. female presents to [facility] hospital on 09/25/2021 with L weakness. 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  Pt required intubation 6/13-6/15. PMH includes HTN, PAF, GERD, UTI. Type of Study: Bedside Swallow Evaluation Previous Swallow Assessment: none in chart Diet Prior to this Study: NPO Temperature Spikes Noted: No Respiratory Status: Room air History of Recent Intubation: Yes Length of Intubations (days): 2 days Date extubated: 09/27/21 Behavior/Cognition: Alert;Cooperative;Pleasant mood;Requires cueing Oral Cavity Assessment: Within Functional Limits Oral Care Completed by SLP: Yes Oral Cavity - Dentition: Adequate natural dentition Vision: Impaired for self-feeding Self-Feeding Abilities: Needs assist Patient Positioning: Upright in bed Baseline Vocal Quality: Breathy;Hoarse;Low vocal intensity Volitional Cough: Weak Volitional Swallow: Able to elicit    Oral/Motor/Sensory Function Overall Oral Motor/Sensory Function: Mild impairment Facial ROM: Reduced left;Suspected CN VII (facial) dysfunction Facial Symmetry: Abnormal symmetry left;Other (Comment) Facial Strength: Reduced left;Suspected CN VII (facial) dysfunction Facial Sensation: Reduced left;Suspected CN V (Trigeminal) dysfunction Lingual ROM: Within Functional Limits Lingual Symmetry: Within Functional Limits Lingual Strength: Within Functional Limits Velum: Within Functional Limits Mandible: Within Functional Limits   Ice Chips Ice chips: Impaired Presentation:  Spoon Pharyngeal Phase Impairments: Throat Clearing - Immediate   Thin Liquid Thin Liquid: Impaired Presentation: Cup;Self Fed;Spoon Oral Phase Functional Implications: Oral holding  Pharyngeal  Phase Impairments: Throat Clearing - Immediate;Other (comments) ('have to catch my breath")    Nectar Thick Nectar Thick Liquid: Not tested   Honey Thick Honey Thick Liquid: Not tested   Puree Puree: Impaired Presentation: Spoon Oral Phase Functional Implications: Oral holding Pharyngeal Phase Impairments: Decreased hyoid-laryngeal movement   Solid     Solid: Not tested      Mahala Menghini., M.A. CCC-SLP Acute Rehabilitation Services Office [phone]  Secure chat preferred  09/28/2021,9:39 AM

## 2021-09-28 NOTE — Evaluation (Signed)
Speech Language Pathology Evaluation Patient Details Name: Charlotte Hunter MRN: [id number] DOB: 03-29-1956 Today's Date: 09/28/2021 Time: 1607-3710 SLP Time Calculation (min) (ACUTE ONLY): 10 min  Problem List:  Patient Active Problem List   Diagnosis Date Noted   ICH (intracerebral hemorrhage) ([name]) 09/25/2021   Past Medical History:  Past Medical History:  Diagnosis Date   A-fib [facility])    Aortic insufficiency    Hypertension    Past Surgical History: The histories are not reviewed yet. Please review them in the "History" navigator section and refresh this SmartLink. HPI:  66 y.o. female presents to [facility] hospital on 09/25/2021 with L weakness. 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  Pt required intubation 6/13-6/15. PMH includes HTN, PAF, GERD, UTI.   Assessment / Plan / Recommendation Clinical Impression  Pt has dysarthria that may also be impacted by recent intubation. She presently has reduced breath support, dysphonia, and imprecise articulation from sensorimotor deficits. Pt needs a little prompting to identify acute physical and communicative changes and for delayed recall tasks. She demonstrated some adequate simple verbal problem solving and is oriented x4 but loses her attention despite quiet environment. Pt will benefit from ongoing SLP f/u for dysarthria and cognition.    SLP Assessment  SLP Recommendation/Assessment: Patient needs continued Speech Lanaguage Pathology Services SLP Visit Diagnosis: Dysarthria and anarthria (R47.1);Cognitive communication deficit (R41.841)    Recommendations for follow up therapy are one component of a multi-disciplinary discharge planning process, led by the attending physician.  Recommendations may be updated based on patient status, additional functional criteria and insurance authorization.    Follow Up Recommendations  Acute inpatient rehab (3hours/day)    Assistance Recommended at Discharge  Frequent or constant  Supervision/Assistance  Functional Status Assessment Patient has had a recent decline in their functional status and demonstrates the ability to make significant improvements in function in a reasonable and predictable amount of time.  Frequency and Duration min 2x/week  2 weeks      SLP Evaluation Cognition  Overall Cognitive Status: Impaired/Different from baseline Arousal/Alertness: Awake/alert Orientation Level: Oriented X4 Attention: Sustained Sustained Attention: Impaired Sustained Attention Impairment: Verbal basic Memory: Impaired Memory Impairment: Decreased recall of new information Awareness: Impaired Awareness Impairment: Intellectual impairment Problem Solving: Appears intact (verbal basic)       Comprehension  Auditory Comprehension Overall Auditory Comprehension: Appears within functional limits for tasks assessed    Expression Expression Primary Mode of Expression: Verbal Verbal Expression Overall Verbal Expression: Appears within functional limits for tasks assessed   Oral / Motor  Oral Motor/Sensory Function Overall Oral Motor/Sensory Function: Mild impairment Facial ROM: Reduced left;Suspected CN VII (facial) dysfunction Facial Symmetry: Abnormal symmetry left;Other (Comment) Facial Strength: Reduced left;Suspected CN VII (facial) dysfunction Facial Sensation: Reduced left;Suspected CN V (Trigeminal) dysfunction Lingual ROM: Within Functional Limits Lingual Symmetry: Within Functional Limits Lingual Strength: Within Functional Limits Velum: Within Functional Limits Mandible: Within Functional Limits Motor Speech Overall Motor Speech: Impaired Respiration: Impaired Level of Impairment: Word Phonation: Breathy;Hoarse;Low vocal intensity Resonance: Within functional limits Articulation: Impaired Level of Impairment: Word Intelligibility: Intelligibility reduced Word: 75-100% accurate Phrase: 50-74% accurate Sentence: 50-74% accurate Interfering  Components:  (s/p intubation) Effective Techniques: Increased vocal intensity            Mahala Menghini., M.A. CCC-SLP Acute Rehabilitation Services Office [phone]  Secure chat preferred  09/28/2021, 9:45 AM

## 2021-09-28 NOTE — Progress Notes (Signed)
Initial Nutrition Assessment  DOCUMENTATION CODES:   Not applicable  INTERVENTION:   Multivitamin w/ minerals daily Vital Cuisine Shake Daily, each supplement provides 520 kcal and 22 grams of protein Meal ordering with assist  Encourage good PO intake   NUTRITION DIAGNOSIS:   Increased nutrient needs related to acute illness as evidenced by estimated needs.  GOAL:   Patient will meet greater than or equal to 90% of their needs  MONITOR:   PO intake, Supplement acceptance, Labs, I & O's  REASON FOR ASSESSMENT:   Other (Comment)    ASSESSMENT:   66 y.o. female presented to [facility] ED with L side weakness. PMH includes HTN and A. Fib. Pt found to have an ICH and was transferred to [place] for further management.   6/13 - intubated 6/15 - extubated 6/16 - MBS w/ SLP, recommend Dysphagia 3, Nectar Thick Liquids  Pt working with PT during RD initial assessment. Spoke with pt daughter at bedside.   Daughter reports that pt had a good appetite PTA and that she ate a well balanced diet. States that her dessert is typically yogurt with blueberries in it. Daughter shares that they follow partially a Kosher diet, they do not eat pork or shellfish. Pt reports that she watches her sugar and salt intake to help with her A.fib.   Denies any recent weight loss. Pt reports that she was recently on a medicine that made her gain weight. Pt is very active in her gardens.   Discussed adding ONS until pt is more awake and alert for consistent PO intake. Pt hesitant due to not wanting to gain any more weight. Discussed that pt has increased needs due to acute illness and ongoing rehab work. Pt agreeable.   Medications reviewed and include: Protonix, Senokot Labs reviewed: Potassium 3.3, Phosphorus 1.9    NUTRITION - FOCUSED PHYSICAL EXAM:  Flowsheet Row Most Recent Value  Orbital Region No depletion  Upper Arm Region No depletion  Thoracic and Lumbar Region No depletion  Buccal Region No  depletion  Temple Region No depletion  Clavicle Bone Region No depletion  Clavicle and Acromion Bone Region No depletion  Scapular Bone Region No depletion  Dorsal Hand No depletion  Patellar Region No depletion  Anterior Thigh Region No depletion  Posterior Calf Region No depletion  Edema (RD Assessment) None  Hair Reviewed  Eyes Reviewed  Mouth Reviewed  Skin Reviewed  Nails Reviewed   Diet Order:   Diet Order             DIET DYS 3 Room service appropriate? Yes with Assist; Fluid consistency: Nectar Thick  Diet effective now                   EDUCATION NEEDS:   No education needs have been identified at this time  Skin:  Skin Assessment: Reviewed RN Assessment  Last BM:  PTA  Height:   Ht Readings from Last 1 Encounters:  09/27/21 5\' 6"  (1.676 m)    Weight:   Wt Readings from Last 1 Encounters:  09/25/21 82.1 kg    Ideal Body Weight:  59.1 kg  BMI:  Body mass index is 29.21 kg/m.  Estimated Nutritional Needs:   Kcal:  1900-2100  Protein:  95-110 grams  Fluid:  >/= 1.9 L    09/27/21 RD, LDN Clinical Dietitian See [facility] for contact information.

## 2021-09-28 NOTE — Progress Notes (Signed)
Orthopedic Tech Progress Note Patient Details:  Charlotte Hunter 02-05-1956 [id number]  Ortho Devices Type of Ortho Device: Prafo boot/shoe Ortho Device/Splint Location: LLE Ortho Device/Splint Interventions: Ordered, Application, Adjustment   Post Interventions Patient Tolerated: Well Instructions Provided: Care of device  [name] 09/28/2021, 4:22 PM

## 2021-09-28 NOTE — Progress Notes (Signed)
Inpatient Rehab Admissions Coordinator Note:   Per therapy recommendations patient was screened for CIR candidacy by Stephania Fragmin, PT. At this time, pt appears to be a potential candidate for CIR. I will place an order for rehab consult for full assessment, per our protocol.  Please contact me any with questions.Estill Dooms, PT, DPT [phone] 09/28/21 2:12 PM

## 2021-09-28 NOTE — Progress Notes (Signed)
NAME:  Charlotte Hunter, MRN:  [id number], DOB:  01-Mar-1956, LOS: 3 ADMISSION DATE:  09/25/2021 CONSULTATION DATE:  09/26/2021 REFERRING MD:  Otelia Limes - Neuro CHIEF COMPLAINT:  ICH, vent management   History of Present Illness:  66 year old woman who presented to [facility] 6/13 as a Code Stroke with left-sided weakness, HA, nausea. LKN 1400 6/13. NIHSS 13. 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  Patient presented to [facility] with L-sided weakness and HA. STAT CT Head demonstrated acute intraparenchymal hemorrhage centered at the right thalamus (estimated volume 11mL) small volume intraventricular extension with blood in the third and fourth ventricles, no hydrocephalus. Mild mass effect of 1-55mm right to left midline shift. Not a candidate for TNK, given ICH. Andexxa reversal agent administered in the setting of home Xarelto for PAF. Intubated in ED for airway protection. Per chart review, following commands on sedation post-intubation. Cleviprex gtt was initiated for BP management. HTS 3% started per Neuro. Transferred to [facility] for a higher level of care.  PCCM consulted for vent/BP management.  Pertinent Medical History:  HTN, PAF (s/p ablation, on Eliquis/Xarelto), ectopic atrial rhythm, non-rheumatic AI (followed by [facility]), GERD, frequent UTI  Significant Hospital Events: Including procedures, antibiotic start and stop dates in addition to other pertinent events   6/13 - Presented to [facility] via EMS for L-sided weakness, HA, nausea/vomiting. LKN 1400. Code Stroke called. CT Head with R thalamic ICH, small amount of mass effect/midline shift. Neuro consulted. Andexxa administered. 6/14 - Transferred from [facility] to [facility] Repeat CT Head stable. HTS 3% started. PCCM consulted for vent/BP management. 6/15 - extubated   Interim History / Subjective:  Extubated 6/15 without complications. Concern for aspiration  with water, medications, plans for speech therapy evaluation today.   Objective:  Blood pressure (!) 152/81, pulse 73, temperature 98.2 F (36.8 C), temperature source Oral, resp. rate 17, height 5\' 6"  (1.676 m), weight 82.1 kg, SpO2 93 %.    Vent Mode: Stand-by   Intake/Output Summary (Last 24 hours) at 09/28/2021 0838 Last data filed at 09/28/2021 0800 Gross per 24 hour  Intake 1142.21 ml  Output 1300 ml  Net -157.79 ml   Filed Weights   09/25/21 2340  Weight: 82.1 kg   Physical Examination: General: adult female lying in bed, no distress  HEENT: Dry MM  Neuro: awakens with stimulation, follows commands on right hemibody, left hemibody flaccid, pupils intact and reactive, motor 5/5 on right. 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 Mild mass effect of 1-17mm right to left midline shift. Possibility of mass not ruled out, less likely HT of ischemic CVA per Neuro.  Focal severe distal left P2 stenosis  P: Management per neurology  Maintain neuro protective measures; goal for eurothermia, euglycemia, eunatermia, normoxia Nutrition and bowel regiment  Seizure precautions  Aspirations precautions  SBP  goal per neurology  Frequent neuro checks  PT/OT/ST evaluations pending   Respiratory insufficiency in the setting  of ICH -Extubated 6/15  P: Titrate Supplemental oxygen for saturation goal >92 Encourage good pulmonary hygiene   Paroxysmal AF Ectopic atrial rhythm -S/p ablation. Patient endorses use of Eliquis PTA -Andexxa administered in the setting of ICH Hypertension -Home medication includes; Cozaar, and Toprol XL P: Cardiac Monitoring  SBP <160. Hydralazine/Labetalol PRN  Continue home metoprolol and losartan. Continue to hold home propafenone for now  Hold home Eliquis Will need ongoing discussion regarding risk vs benefit for ongoing AC  Hypokalemia  Hypophosphatemia  P:  Replacing now Trend BMP   GERD P:  Continue home PPI   Best Practice: (right click and "Reselect all SmartList Selections" daily)   Per Primary Team. Will discuss with primary team possible transfer out of ICU.    Jovita Kussmaul, AGACNP-BC [facility]  PCCM Pgr: [phone]

## 2021-09-28 NOTE — Progress Notes (Signed)
Physical Therapy Treatment Patient Details Name: Charlotte Hunter MRN: [id number] DOB: 08/03/1955 Today's Date: 09/28/2021   History of Present Illness 66 y.o. female presents to [facility] hospital on 09/25/2021 with L weakness. 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  Pt required intubation 6/13. PMH includes HTN, PAF, GERD, UTI.    PT Comments    Pt tolerates treatment well despite reports of L knee pain. Pt continues to demonstrate L lean and flaccid L side, although she now reports sensation to pain as well as burning/tingling. Pt will benefit from continued aggressive mobilization in an effort to reduce falls risk and caregiver burden. PT continues to recommend AIR admission.   Recommendations for follow up therapy are one component of a multi-disciplinary discharge planning process, led by the attending physician.  Recommendations may be updated based on patient status, additional functional criteria and insurance authorization.  Follow Up Recommendations  Acute inpatient rehab (3hours/day)     Assistance Recommended at Discharge Frequent or constant Supervision/Assistance  Patient can return home with the following Two people to help with walking and/or transfers;Two people to help with bathing/dressing/bathroom;Assistance with cooking/housework;Assistance with feeding;Direct supervision/assist for medications management;Direct supervision/assist for financial management;Assist for transportation;Help with stairs or ramp for entrance   Equipment Recommendations  Wheelchair (measurements PT);Wheelchair cushion (measurements PT);Hospital bed;BSC/3in1    Recommendations for Other Services       Precautions / Restrictions Precautions Precautions: Fall Precaution Comments: L hemiplegia Restrictions Weight Bearing Restrictions: No     Mobility  Bed Mobility Overal bed mobility: Needs Assistance Bed Mobility: Supine to Sit     Supine to sit: HOB elevated, Mod assist      General bed mobility comments: hand hold to pull trunk into sitting, assist to move LLE    Transfers Overall transfer level: Needs assistance Equipment used: 1 person hand held assist Transfers: Sit to/from Stand, Bed to chair/wheelchair/BSC Sit to Stand: Mod assist Stand pivot transfers: Max assist         General transfer comment: PT provides L knee block and BUE support    Ambulation/Gait                   Stairs             Wheelchair Mobility    Modified Rankin (Stroke Patients Only)       Balance Overall balance assessment: Needs assistance Sitting-balance support: Single extremity supported, Feet supported Sitting balance-Leahy Scale: Poor Sitting balance - Comments: minG-minA with LUE support of railing, mod-maxA without UE support Postural control: Left lateral lean Standing balance support: Single extremity supported, Bilateral upper extremity supported Standing balance-Leahy Scale: Poor Standing balance comment: maxA, left lean                            Cognition Arousal/Alertness: Awake/alert Behavior During Therapy: Flat affect Overall Cognitive Status: Impaired/Different from baseline Area of Impairment: Memory, Safety/judgement, Awareness, Problem solving                     Memory: Decreased recall of precautions   Safety/Judgement: Decreased awareness of safety, Decreased awareness of deficits Awareness: Emergent Problem Solving: Slow processing, Requires verbal cues, Requires tactile cues          Exercises General Exercises - Lower Extremity Ankle Circles/Pumps: AROM, Right, 10 reps Gluteal Sets: AROM, Both, 5 reps Long Arc Quad: AROM, Right, 10 reps Hip ABduction/ADduction: AROM, Right, 5 reps    General  Comments General comments (skin integrity, edema, etc.): VSS on RA      Pertinent Vitals/Pain Pain Assessment Pain Assessment: Faces Faces Pain Scale: Hurts little more Pain Location: L  knee Pain Descriptors / Indicators: Grimacing Pain Intervention(s): Monitored during session    Home Living                          Prior Function            PT Goals (current goals can now be found in the care plan section) Acute Rehab PT Goals Patient Stated Goal: to improve mobility quality in an effort to return to independence Progress towards PT goals: Progressing toward goals    Frequency    Min 4X/week      PT Plan Current plan remains appropriate    Co-evaluation              AM-PAC PT "6 Clicks" Mobility   Outcome Measure  Help needed turning from your back to your side while in a flat bed without using bedrails?: A Little Help needed moving from lying on your back to sitting on the side of a flat bed without using bedrails?: A Lot Help needed moving to and from a bed to a chair (including a wheelchair)?: A Lot Help needed standing up from a chair using your arms (e.g., wheelchair or bedside chair)?: A Lot Help needed to walk in hospital room?: Total Help needed climbing 3-5 steps with a railing? : Total 6 Click Score: 11    End of Session   Activity Tolerance: Patient tolerated treatment well Patient left: in chair;with call bell/phone within reach;with chair alarm set Nurse Communication: Mobility status;Need for lift equipment PT Visit Diagnosis: Other abnormalities of gait and mobility (R26.89);Other symptoms and signs involving the nervous system (R29.898);Hemiplegia and hemiparesis Hemiplegia - Right/Left: Left Hemiplegia - dominant/non-dominant: Non-dominant Hemiplegia - caused by: Nontraumatic intracerebral hemorrhage     Time: 1330-1415 PT Time Calculation (min) (ACUTE ONLY): 45 min  Charges:  $Therapeutic Exercise: 8-22 mins $Therapeutic Activity: 23-37 mins                     Arlyss Gandy, PT, DPT Acute Rehabilitation Office [phone]    Arlyss Gandy 09/28/2021, 2:25 PM

## 2021-09-28 NOTE — Progress Notes (Addendum)
Modified Barium Swallow Progress Note  Patient Details  Name: Charlotte Hunter MRN: [id number] Date of Birth: 1955/08/06  Today's Date: 09/28/2021  Modified Barium Swallow completed.  Full report located under Chart Review in the Imaging Section.  Brief recommendations include the following:  Clinical Impression  Pt has a mild oropharyngeal dysphagia. Her oral phase and mastication are slow with solid consistencies, and she has a tendency to hold liquids in her mouth before swallowing. Although this takes time, she does not necessarily need to be prompted to swallow. She has mildly reduced airway closure and if her timing is slightly off, thin liquids will enter her airway. This is primarily penetration but at times it rests on the vocal cords, and there is aspiration (PAS 7) on consecutive sips. A chin tuck does not improve airway protection, but thicker liquids do. Recommend starting with Dys 3 diet and nectar thick liquids.   Swallow Evaluation Recommendations       SLP Diet Recommendations: Dysphagia 3 (Mech soft) solids;Nectar thick liquid   Liquid Administration via: Cup;Straw   Medication Administration: Whole meds with puree   Supervision: Staff to assist with self feeding   Compensations: Minimize environmental distractions;Slow rate;Small sips/bites   Postural Changes: Seated upright at 90 degrees;Remain semi-upright after after feeds/meals (Comment)   Oral Care Recommendations: Oral care BID        Mahala Menghini., M.A. CCC-SLP Acute Rehabilitation Services Office [phone]  Secure chat preferred  09/28/2021,2:18 PM

## 2021-09-28 NOTE — Progress Notes (Addendum)
STROKE TEAM PROGRESS NOTE   SUBJECTIVE (INTERVAL HISTORY)  RN at bedside. Patient laying in bed awake and alert. Oriented to person, place and time. Extubated on RA. Hypophonic voice today. C/o Blurry vision bilaterally and double vision. Right gaze preference. Left facial droop. Left arm flaccid, no sensation to noxious stimuli on left arm. Decreased sensation on left side. Left arm flaccid. Left leg 1/5. Right arm and leg 5/5. MBS scheduled today. No new neurological events overnight. Has been stable and BP controlled.  Will transfer out of ICU today and ask Hospitalist to assume care tomorrow   OBJECTIVE Temp:  [98.2 F (36.8 C)-100.1 F (37.8 C)] 98.2 F (36.8 C) (06/16 0400) Pulse Rate:  [61-87] 73 (06/16 0800) Cardiac Rhythm: Normal sinus rhythm;Sinus bradycardia (06/15 2000) Resp:  [14-35] 17 (06/16 0800) BP: (121-161)/(56-96) 152/81 (06/16 0800) SpO2:  [89 %-100 %] 93 % (06/16 0800)  Recent Labs  Lab 09/25/21 1439  GLUCAP 108*    Recent Labs  Lab 09/25/21 1442 09/25/21 2236 09/25/21 2321 09/26/21 0424 09/26/21 1001 09/27/21 0534 09/28/21 0354  NA 138   < > 143 144 145 145 141  K 3.2*  --  4.1  --  3.9 3.2* 3.3*  CL 103  --   --   --  117* 114* 111  CO2 23  --   --   --  21* 23 24  GLUCOSE 113*  --   --   --  111* 100* 89  BUN 13  --   --   --  13 14 13   CREATININE 0.66  --   --   --  0.64 0.59 0.69  CALCIUM 9.4  --   --   --  8.4* 8.8* 8.6*  MG  --   --   --   --   --   --  2.2  PHOS  --   --   --   --   --   --  1.9*   < > = values in this interval not displayed.    Recent Labs  Lab 09/25/21 1442  AST 20  ALT 16  ALKPHOS 78  BILITOT 0.7  PROT 7.7  ALBUMIN 4.3    Recent Labs  Lab 09/25/21 1442 09/25/21 2321 09/27/21 0534 09/28/21 0354  WBC 8.8  --  8.7 9.2  NEUTROABS 4.7  --   --   --   HGB 12.0 9.9* 10.3* 10.3*  HCT 37.4 29.0* 32.1* 32.2*  MCV 91.4  --  94.1 93.9  PLT 273  --  190 197    No results for input(s): "CKTOTAL", "CKMB",  "CKMBINDEX", "TROPONINI" in the last 168 hours. Recent Labs    09/25/21 1442  LABPROT 14.8  INR 1.2    Recent Labs    09/25/21 1559  COLORURINE YELLOW*  LABSPEC 1.010  PHURINE 6.0  GLUCOSEU NEGATIVE  HGBUR NEGATIVE  BILIRUBINUR NEGATIVE  KETONESUR 5*  PROTEINUR NEGATIVE  NITRITE NEGATIVE  LEUKOCYTESUR NEGATIVE        Component Value Date/Time   CHOL 211 (H) 09/27/2021 0534   TRIG 180 (H) 09/27/2021 0534   HDL 41 09/27/2021 0534   CHOLHDL 5.1 09/27/2021 0534   VLDL 36 09/27/2021 0534   LDLCALC 134 (H) 09/27/2021 0534   Lab Results  Component Value Date   HGBA1C 5.9 (H) 09/25/2021   No results found for: "LABOPIA", "COCAINSCRNUR", "LABBENZ", "AMPHETMU", "THCU", "LABBARB"  No results for input(s): "ETH" in the last 168 hours.  I  have personally reviewed the radiological images below and agree with the radiology interpretations.  VAS US CAROTID  Result Date: 09/27/2021 Carotid Arterial Duplex Study Patient Name:  Charlotte Hunter  Date of Exam:   09/27/2021 Medical Rec #: [id number]         Accession #:    [id number] Date of Birth: November 29, 1955         Patient Gender: F Patient Age:   66 years Exam Location:  [facility] Procedure:      VAS US CAROTID Referring Phys: Marvel Plan --------------------------------------------------------------------------------  Performing Technologist: Jean Rosenthal RDMS, RVT  Examination Guidelines: A complete evaluation includes B-mode imaging, spectral Doppler, color Doppler, and power Doppler as needed of all accessible portions of each vessel. Bilateral testing is considered an integral part of a complete examination. Limited examinations for reoccurring indications may be performed as noted.  Right Carotid Findings: +----------+--------+--------+--------+------------------+------------------+           PSV cm/sEDV cm/sStenosisPlaque DescriptionComments            +----------+--------+--------+--------+------------------+------------------+ CCA Prox  89      13                                                   +----------+--------+--------+--------+------------------+------------------+ CCA Distal70      14                                intimal thickening +----------+--------+--------+--------+------------------+------------------+ ICA Prox  65      16                                intimal thickening +----------+--------+--------+--------+------------------+------------------+ ICA Mid   98      26                                                   +----------+--------+--------+--------+------------------+------------------+ ICA Distal93      29                                                   +----------+--------+--------+--------+------------------+------------------+ ECA       93      7                                                    +----------+--------+--------+--------+------------------+------------------+ +----------+--------+-------+---------+-------------------+           PSV cm/sEDV cmsDescribe Arm Pressure (mmHG) +----------+--------+-------+---------+-------------------+ [id number]            Turbulent                    +----------+--------+-------+---------+-------------------+ +---------+--------+--+--------+--+---------+ VertebralPSV cm/s73EDV cm/s21Antegrade +---------+--------+--+--------+--+---------+  Left Carotid Findings: +----------+--------+--------+--------+------------------+------------------+           PSV cm/sEDV cm/sStenosisPlaque DescriptionComments           +----------+--------+--------+--------+------------------+------------------+  CCA Prox  102     20                                                   +----------+--------+--------+--------+------------------+------------------+ CCA Distal90      19                                                    +----------+--------+--------+--------+------------------+------------------+ ICA Prox  68      15                                intimal thickening +----------+--------+--------+--------+------------------+------------------+ ICA Mid   129     29                                                   +----------+--------+--------+--------+------------------+------------------+ ICA Distal133     36                                                   +----------+--------+--------+--------+------------------+------------------+ ECA       86      11                                                   +----------+--------+--------+--------+------------------+------------------+ +----------+--------+--------+---------+-------------------+           PSV cm/sEDV cm/sDescribe Arm Pressure (mmHG) +----------+--------+--------+---------+-------------------+ [id number]             Turbulent                    +----------+--------+--------+---------+-------------------+ +---------+--------+--+--------+--+---------+ VertebralPSV cm/s69EDV cm/s15Antegrade +---------+--------+--+--------+--+---------+   Summary: Right Carotid: The extracranial vessels were near-normal with only minimal wall                thickening or plaque. Left Carotid: The extracranial vessels were near-normal with only minimal wall               thickening or plaque. Vertebrals:  Bilateral vertebral arteries demonstrate antegrade flow. Subclavians: Bilateral subclavian artery flow was disturbed. *See table(s) above for measurements and observations.     Preliminary    MR BRAIN WO CONTRAST  Result Date: 09/27/2021 CLINICAL DATA:  Follow-up examination for hemorrhagic stroke. EXAM: MRI HEAD WITHOUT CONTRAST MRA HEAD WITHOUT CONTRAST TECHNIQUE: Multiplanar, multi-echo pulse sequences of the brain and surrounding structures were acquired without intravenous contrast. Angiographic images of the Circle of Willis were  acquired using MRA technique without intravenous contrast. COMPARISON:  Prior CT from 09/25/2021. FINDINGS: MRI HEAD FINDINGS Brain: Cerebral volume within normal limits. Scattered patchy T2/FLAIR hyperintensity involving the periventricular deep white matter both cerebral hemispheres, most consistent with chronic small vessel ischemic disease, moderately advanced in nature. Previously identified acute intraparenchymal hemorrhage centered at  the right thalamus again seen, not significantly changed in size or morphology as compared to prior CT. Lesion measures approximately 2.9 x 1.6 x 2.2 cm by MRI. Surrounding T2/FLAIR signal consistent with edema, mildly increased from prior. Edema extends inferiorly into the right cerebral peduncle/midbrain. Mild localized right-to-left shift. Intraventricular extension with small volume intraventricular hemorrhage, similar as well. No hydrocephalus or trapping. No other evidence for acute or subacute ischemia. Focus of mild diffusion signal abnormality at the posterior left frontal centrum semi ovale felt to be related to susceptibility artifact (series 5, image 87). No other acute intracranial hemorrhage. Few scattered chronic micro hemorrhages noted, likely hypertensive in nature. No mass lesion or extra-axial fluid collection. Pituitary gland suprasellar region within normal limits. Vascular: Major intracranial vascular flow voids are maintained. Skull and upper cervical spine: Craniocervical junction within normal limits. Bone marrow signal intensity normal. Small lipoma at the right frontal scalp noted. Scalp soft tissues otherwise unremarkable. Sinuses/Orbits: Globes orbital soft tissues within normal limits. Mild scattered mucosal thickening noted about the ethmoidal air cells. Few small left maxillary sinus retention cyst noted. Trace right mastoid effusion. Patient is intubated. Other: None. MRA HEAD FINDINGS Anterior circulation: Visualized distal cervical segments of  the internal carotid arteries are patent with antegrade flow. Distal cervical right ICA is tortuous. Petrous, cavernous, supraclinoid segments patent without stenosis or other abnormality. A1 segments patent bilaterally. Normal anterior communicating artery complex. Anterior cerebral arteries patent without stenosis. No M1 stenosis or occlusion. No proximal MCA branch occlusion. Distal MCA branches perfused and symmetric. Posterior circulation: Both vertebral arteries patent without stenosis. Left vertebral artery slightly dominant. Both PICA patent. Basilar patent to its distal aspect without stenosis. Short-segment fenestration involving distal basilar artery just prior to the apex noted. Superior cerebellar arteries patent bilaterally. Both PCA supplied via the basilar as well as small bilateral posterior communicating arteries. Right PCA widely patent to its distal aspect. Focal severe distal left P2 stenosis noted (series 1033, image 4). Left PCA otherwise patent as well. Anatomic variants: None significant. No intracranial aneurysm. No other vascular malformation seen underlying the right thalamic hemorrhage. IMPRESSION: MRI HEAD IMPRESSION: 1. Unchanged size and morphology of acute intraparenchymal hemorrhage centered at the right thalamus. Surrounding edema and regional mass effect has mildly increased as compared to prior CT from 09/25/2021. 2. Intraventricular extension with small volume intraventricular hemorrhage, similar. No hydrocephalus or trapping. 3. Underlying moderate chronic microvascular ischemic disease. MRA HEAD IMPRESSION: 1. Negative intracranial MRA for large vessel occlusion. 2. Focal severe distal left P2 stenosis. 3. Otherwise negative intracranial MRA. No other hemodynamically significant or correctable stenosis. No aneurysm or other vascular malformation seen underlying the right thalamic hemorrhage. Electronically Signed   By: Jeannine Boga M.D.   On: 09/27/2021 06:06   MR  ANGIO HEAD WO CONTRAST  Result Date: 09/27/2021 CLINICAL DATA:  Follow-up examination for hemorrhagic stroke. EXAM: MRI HEAD WITHOUT CONTRAST MRA HEAD WITHOUT CONTRAST TECHNIQUE: Multiplanar, multi-echo pulse sequences of the brain and surrounding structures were acquired without intravenous contrast. Angiographic images of the Circle of Willis were acquired using MRA technique without intravenous contrast. COMPARISON:  Prior CT from 09/25/2021. FINDINGS: MRI HEAD FINDINGS Brain: Cerebral volume within normal limits. Scattered patchy T2/FLAIR hyperintensity involving the periventricular deep white matter both cerebral hemispheres, most consistent with chronic small vessel ischemic disease, moderately advanced in nature. Previously identified acute intraparenchymal hemorrhage centered at the right thalamus again seen, not significantly changed in size or morphology as compared to prior CT. Lesion measures approximately 2.9  x 1.6 x 2.2 cm by MRI. Surrounding T2/FLAIR signal consistent with edema, mildly increased from prior. Edema extends inferiorly into the right cerebral peduncle/midbrain. Mild localized right-to-left shift. Intraventricular extension with small volume intraventricular hemorrhage, similar as well. No hydrocephalus or trapping. No other evidence for acute or subacute ischemia. Focus of mild diffusion signal abnormality at the posterior left frontal centrum semi ovale felt to be related to susceptibility artifact (series 5, image 87). No other acute intracranial hemorrhage. Few scattered chronic micro hemorrhages noted, likely hypertensive in nature. No mass lesion or extra-axial fluid collection. Pituitary gland suprasellar region within normal limits. Vascular: Major intracranial vascular flow voids are maintained. Skull and upper cervical spine: Craniocervical junction within normal limits. Bone marrow signal intensity normal. Small lipoma at the right frontal scalp noted. Scalp soft tissues  otherwise unremarkable. Sinuses/Orbits: Globes orbital soft tissues within normal limits. Mild scattered mucosal thickening noted about the ethmoidal air cells. Few small left maxillary sinus retention cyst noted. Trace right mastoid effusion. Patient is intubated. Other: None. MRA HEAD FINDINGS Anterior circulation: Visualized distal cervical segments of the internal carotid arteries are patent with antegrade flow. Distal cervical right ICA is tortuous. Petrous, cavernous, supraclinoid segments patent without stenosis or other abnormality. A1 segments patent bilaterally. Normal anterior communicating artery complex. Anterior cerebral arteries patent without stenosis. No M1 stenosis or occlusion. No proximal MCA branch occlusion. Distal MCA branches perfused and symmetric. Posterior circulation: Both vertebral arteries patent without stenosis. Left vertebral artery slightly dominant. Both PICA patent. Basilar patent to its distal aspect without stenosis. Short-segment fenestration involving distal basilar artery just prior to the apex noted. Superior cerebellar arteries patent bilaterally. Both PCA supplied via the basilar as well as small bilateral posterior communicating arteries. Right PCA widely patent to its distal aspect. Focal severe distal left P2 stenosis noted (series 1033, image 4). Left PCA otherwise patent as well. Anatomic variants: None significant. No intracranial aneurysm. No other vascular malformation seen underlying the right thalamic hemorrhage. IMPRESSION: MRI HEAD IMPRESSION: 1. Unchanged size and morphology of acute intraparenchymal hemorrhage centered at the right thalamus. Surrounding edema and regional mass effect has mildly increased as compared to prior CT from 09/25/2021. 2. Intraventricular extension with small volume intraventricular hemorrhage, similar. No hydrocephalus or trapping. 3. Underlying moderate chronic microvascular ischemic disease. MRA HEAD IMPRESSION: 1. Negative  intracranial MRA for large vessel occlusion. 2. Focal severe distal left P2 stenosis. 3. Otherwise negative intracranial MRA. No other hemodynamically significant or correctable stenosis. No aneurysm or other vascular malformation seen underlying the right thalamic hemorrhage. Electronically Signed   By: Rise Mu M.D.   On: 09/27/2021 06:06   DG Pelvis Portable  Result Date: 09/26/2021 CLINICAL DATA:  MRI screening, evaluate for metal. EXAM: PORTABLE PELVIS 1-2 VIEWS COMPARISON:  None Available. FINDINGS: There is no evidence for metallic foreign body. Phleboliths are noted in the pelvis. No dilated bowel loops. No acute fractures. IMPRESSION: No evidence for metallic foreign body in the pelvis or lower abdomen. Electronically Signed   By: Darliss Cheney M.D.   On: 09/26/2021 19:28   ECHOCARDIOGRAM COMPLETE  Result Date: 09/26/2021    ECHOCARDIOGRAM REPORT   Patient Name:   Charlotte Hunter Date of Exam: 09/26/2021 Medical Rec #:  [id number]        Height: Accession #:    [id number]       Weight:       181.0 lb Date of Birth:  1955/10/22        BSA:  1.889 m Patient Age:    66 years         BP:           134/90 mmHg Patient Gender: F                HR:           62 bpm. Exam Location:  Inpatient Procedure: 2D Echo, Cardiac Doppler and Color Doppler                               MODIFIED REPORT: This report was modified by Dorris Carnes MD on 09/26/2021 due to Report review.  Indications:     Stroke  History:         Patient has prior history of Echocardiogram examinations, most                  recent 12/03/2019. Aortic Valve Disease, Arrythmias:Atrial                  Fibrillation; Risk Factors:Hypertension. Previous echo done at                  [facility].  Sonographer:     Joette Catching RCS Referring Phys:  [id number] ERIC LINDZEN Diagnosing Phys: Dorris Carnes MD IMPRESSIONS  1. Left ventricular ejection fraction, by estimation, is 65 to 70%. The left ventricle has normal function. The left  ventricle has no regional wall motion abnormalities. Left ventricular diastolic parameters were normal.  2. Right ventricular systolic function is normal. The right ventricular size is normal.  3. The mitral valve is normal in structure. Mild mitral valve regurgitation.  4. The aortic valve is normal in structure. Aortic valve regurgitation is trivial. Aortic valve sclerosis is present, with no evidence of aortic valve stenosis.  5. There is mild dilatation of the aortic root, measuring 40 mm.  6. The inferior vena cava is dilated in size with <50% respiratory variability, suggesting right atrial pressure of 15 mmHg. FINDINGS  Left Ventricle: Left ventricular ejection fraction, by estimation, is 65 to 70%. The left ventricle has normal function. The left ventricle has no regional wall motion abnormalities. The left ventricular internal cavity size was normal in size. There is  no left ventricular hypertrophy. Left ventricular diastolic parameters were normal. Right Ventricle: The right ventricular size is normal. Right vetricular wall thickness was not assessed. Right ventricular systolic function is normal. Left Atrium: Left atrial size was normal in size. Right Atrium: Right atrial size was normal in size. Pericardium: Trivial pericardial effusion is present. Mitral Valve: The mitral valve is normal in structure. Mild mitral valve regurgitation. Tricuspid Valve: The tricuspid valve is normal in structure. Tricuspid valve regurgitation is trivial. Aortic Valve: The aortic valve is normal in structure. Aortic valve regurgitation is trivial. Aortic regurgitation PHT measures 683 msec. Aortic valve sclerosis is present, with no evidence of aortic valve stenosis. Aortic valve mean gradient measures 5.0 mmHg. Aortic valve peak gradient measures 9.4 mmHg. Aortic valve area, by VTI measures 2.81 cm. Pulmonic Valve: The pulmonic valve was normal in structure. Pulmonic valve regurgitation is trivial. Aorta: The aortic root  and ascending aorta are structurally normal, with no evidence of dilitation. There is mild dilatation of the aortic root, measuring 40 mm. Venous: The inferior vena cava is dilated in size with less than 50% respiratory variability, suggesting right atrial pressure of 15 mmHg. IAS/Shunts: No atrial level shunt detected by  color flow Doppler.  LEFT VENTRICLE PLAX 2D LVIDd:         4.40 cm   Diastology LVIDs:         2.80 cm   LV e' medial:    8.73 cm/s LV PW:         0.90 cm   LV E/e' medial:  12.9 LV IVS:        1.00 cm   LV e' lateral:   11.30 cm/s LVOT diam:     2.30 cm   LV E/e' lateral: 10.0 LV SV:         99 LV SV Index:   53 LVOT Area:     4.15 cm  RIGHT VENTRICLE             IVC RV Basal diam:  3.90 cm     IVC diam: 2.20 cm RV Mid diam:    2.70 cm RV S prime:     15.30 cm/s TAPSE (M-mode): 2.1 cm LEFT ATRIUM             Index        RIGHT ATRIUM           Index LA diam:        3.20 cm 1.69 cm/m   RA Area:     15.40 cm LA Vol (A2C):   54.1 ml 28.64 ml/m  RA Volume:   36.30 ml  19.21 ml/m LA Vol (A4C):   43.2 ml 22.87 ml/m LA Biplane Vol: 50.5 ml 26.73 ml/m  AORTIC VALVE                     PULMONIC VALVE AV Area (Vmax):    3.18 cm      PV Vmax:          0.95 m/s AV Area (Vmean):   2.86 cm      PV Peak grad:     3.6 mmHg AV Area (VTI):     2.81 cm      PR End Diast Vel: 3.65 msec AV Vmax:           153.00 cm/s AV Vmean:          110.000 cm/s AV VTI:            0.353 m AV Peak Grad:      9.4 mmHg AV Mean Grad:      5.0 mmHg LVOT Vmax:         117.00 cm/s LVOT Vmean:        75.600 cm/s LVOT VTI:          0.239 m LVOT/AV VTI ratio: 0.68 AI PHT:            683 msec  AORTA Ao Root diam: 4.00 cm Ao Asc diam:  3.00 cm MITRAL VALVE                TRICUSPID VALVE MV Area (PHT): 4.26 cm     TR Peak grad:   25.0 mmHg MV Decel Time: 178 msec     TR Vmax:        250.00 cm/s MV E velocity: 113.00 cm/s MV A velocity: 99.50 cm/s   SHUNTS MV E/A ratio:  1.14         Systemic VTI:  0.24 m  Systemic Diam: 2.30 cm Dorris Carnes MD Electronically signed by Dorris Carnes MD Signature Date/Time: 09/26/2021/2:49:05 PM    Final (Updated)    DG Abd 1 View  Result Date: 09/25/2021 CLINICAL DATA:  OG tube placement EXAM: ABDOMEN - 1 VIEW COMPARISON:  09/25/2021 FINDINGS: Esophageal tube tip overlies the stomach, side port probably in the region of GE junction. Possible dilated segment of small bowel in the central abdomen. There is gas present in the colon IMPRESSION: Esophageal tube tip overlies the stomach, side-port in the region of GE junction, further advancement suggested for more optimal positioning Electronically Signed   By: Donavan Foil M.D.   On: 09/25/2021 23:27   CT HEAD WO CONTRAST (5MM)  Result Date: 09/25/2021 CLINICAL DATA:  Follow-up examination for intracranial hemorrhage. EXAM: CT HEAD WITHOUT CONTRAST TECHNIQUE: Contiguous axial images were obtained from the base of the skull through the vertex without intravenous contrast. RADIATION DOSE REDUCTION: This exam was performed according to the departmental dose-optimization program which includes automated exposure control, adjustment of the mA and/or kV according to patient size and/or use of iterative reconstruction technique. COMPARISON:  Prior CT from earlier the same day. FINDINGS: Brain: Previously identified acute intraparenchymal hemorrhage centered at the right thalamus again seen, not significantly changed in size measuring 2.2 x 1.5 x 2.3 cm (estimated volume 4 mL, stable when measured in a similar fashion). Mild inferior extension towards the right cerebral peduncle. Intraventricular extension with small volume hemorrhage within the third and fourth ventricles, stable. No hydrocephalus or trapping. No other new acute intracranial hemorrhage. No acute large vessel territory infarct. Scattered hypodensity involving the supratentorial cerebral white matter noted, nonspecific, but most likely related chronic microvascular ischemic  disease. No mass lesion or significant midline shift. No extra-axial fluid collection. Vascular: No hyperdense vessel. Skull: Scalp soft tissues and calvarium within normal limits. Few scattered foci of soft tissue emphysema within the left temporal region likely related IV access. Sinuses/Orbits: Globes and orbital soft tissues within normal limits. Scattered mucosal thickening noted about the ethmoidal air cells. Small retention cysts versus polyps noted within the visualized left maxillary sinus. Mastoid air cells are clear. Other: None. IMPRESSION: 1. No significant interval change in size of acute intraparenchymal hemorrhage centered at the right thalamus, estimated volume 4 mL. Small volume intraventricular extension with blood in the third and fourth ventricles. No hydrocephalus or trapping. 2. No other new acute intracranial abnormality. Electronically Signed   By: Jeannine Boga M.D.   On: 09/25/2021 21:26   DG Abdomen 1 View  Result Date: 09/25/2021 CLINICAL DATA:  Placement of NG tube EXAM: ABDOMEN - 1 VIEW COMPARISON:  None Available. FINDINGS: Distal portion of NG tube is coiled within the fundus of the stomach with its tip pointing coracoid in the region of body of the stomach. Visualized bowel gas pattern is unremarkable. There are linear densities in the lower lung fields suggesting scarring or subsegmental atelectasis. IMPRESSION: Tip of NG tube is seen in the stomach. Electronically Signed   By: Elmer Picker M.D.   On: 09/25/2021 15:50   DG Chest 1 View  Result Date: 09/25/2021 CLINICAL DATA:  Provided history: Intubation. Status post intubation and OG tube placement. EXAM: CHEST  1 VIEW COMPARISON:  Concurrently performed abdominal radiograph 09/25/2021. FINDINGS: ET tube present with tip terminating 2.2 cm above the level of the carina. An enteric tube passes below level of left hemidiaphragm and is coiled within the stomach. Heart size within normal limits. Aortic  atherosclerosis. Mild bibasilar  atelectasis. No appreciable airspace consolidation. No evidence of pleural effusion or pneumothorax. No acute bony abnormality identified. IMPRESSION: Support apparatus, as described. Mild bibasilar atelectasis. Aortic Atherosclerosis (ICD10-I70.0). Electronically Signed   By: Kellie Simmering D.O.   On: 09/25/2021 15:47   CT HEAD CODE STROKE WO CONTRAST  Result Date: 09/25/2021 CLINICAL DATA:  Code stroke.  Stroke suspected, no history given EXAM: CT HEAD WITHOUT CONTRAST TECHNIQUE: Contiguous axial images were obtained from the base of the skull through the vertex without intravenous contrast. RADIATION DOSE REDUCTION: This exam was performed according to the departmental dose-optimization program which includes automated exposure control, adjustment of the mA and/or kV according to patient size and/or use of iterative reconstruction technique. COMPARISON:  None Available. FINDINGS: Brain: Hemorrhage centered in the right thalamus and basal ganglia, which measures approximally 1.3 x 2.8 x 2.2 cm (AP x TR x CC) (series 4, image 13 and series 6, image 24). Surrounding hypodensity, likely mild edema. Hemorrhage is also noted in the third ventricle, cerebral aqueduct, and fourth ventricle. Mild local mass effect 1-2 mm of right-to-left midline shift midline shift. No evidence of acute cortical infarct, hydrocephalus, or extra-axial collection. Vascular: No hyperdense vessel. Skull: No acute osseous abnormality. Sinuses/Orbits: No acute finding. Other: The mastoids are well aerated. IMPRESSION: Hemorrhage centered in the right thalamus and basal ganglia, with mild surrounding edema, minimal midline shift, and a small amount of intraventricular extension into the third and fourth ventricles. Code stroke imaging results were communicated on 09/25/2021 at 2:53 pm to provider BHAGAT via secure text paging. Electronically Signed   By: Merilyn Baba M.D.   On: 09/25/2021 14:56     PHYSICAL  EXAM  Temp:  [98.2 F (36.8 C)-100.1 F (37.8 C)] 98.2 F (36.8 C) (06/16 0400) Pulse Rate:  [61-87] 73 (06/16 0800) Resp:  [14-35] 17 (06/16 0800) BP: (121-161)/(56-96) 152/81 (06/16 0800) SpO2:  [89 %-100 %] 93 % (06/16 0800)  General - Well nourished, well developed, in NAD on RA  Ophthalmologic - fundi not visualized due to noncooperation.  Cardiovascular - Regular rate and rhythm, not in A-fib.  Neuro - Patient laying in bed awake and alert. Oriented to person, place and time. Extubated on RA. Hypophonic voice today. C/o Blurry vision bilaterally and double vision. Right gaze preference. Left facial droop. Left arm flaccid, no sensation to noxious stimuli on left arm. Decreased sensation on left side. Left arm flaccid. Left leg 1/5. Right arm and leg 5/5. Marland Kitchen Sensation decreased on the left, coordination intact on the right FTN and gait not tested.   ASSESSMENT/PLAN Ms. Charlotte Hunter is a 66 y.o. female with history of hypertension, PAF on Eliquis admitted for left-sided weakness. No tPA given due to [place].    ICH:  right thalamic ICH with small IVH, likely secondary to hypertension in the setting of Eliquis use CT head ICH centered in the right thalamus in the basal ganglia, minimal midline shift, small amount of IVH CT repeat stable ICH and IVH MRI stable hematoma and IVH, mildly increase mass effect, no CAA MRA no AVM or aneurysm. Focal severe distal left P2 stenosis Carotid Doppler- Right Carotid: The extracranial vessels were near-normal with only minimal  wall thickening or plaque.  Left Carotid: The extracranial vessels were near-normal with only minimal Wall thickening or plaque.  Vertebrals:  Bilateral vertebral arteries demonstrate antegrade flow.  Subclavians: Bilateral subclavian artery flow was disturbed 2D Echo EF 65 to 70% LDL 134 HgbA1c 5.9 SCDs for VTE prophylaxis Eliquis (apixaban)  daily prior to admission, now on No antithrombotic given ICH  Ongoing  aggressive stroke risk factor management Therapy recommendations: CIR Disposition: Pending  Respiratory failure, resolved  Extubated 6/15  Currently on RA   PAF On Eliquis Was switched to Xarelto but not started yet Reversed with Andexxa Rate controlled currently Not on antithrombotics now given ICH Discussed with daughter about ASPIRE trial, she is considering  Hypertension Home meds losartan, metoprolol XR 50 Stable Off Cleviprex now On home losartan 50, metoprolol 25 twice daily BP goal less than 160 Long term BP goal normotensive  Hyperlipidemia Home meds: None LDL 134, goal < 70 Will consider statin on discharge  Dysphagia Passed swallow today, on nectar thick liquid Resumed home BP meds Speech on board  Other Stroke Risk Factors Advanced age   Hospital day # 3  Gevena Mart DNP, ACNPC-AG   ATTENDING NOTE: I reviewed above note and agree with the assessment and plan. Pt was seen and examined.   RN at bedside.  Patient extubated yesterday, tolerating well, no acute event overnight.  Patient lying in bed, awake alert, fully orientated, still has mild hypophonia, denies headache.  Still has right hemiplegia and right facial droop.  BP stable but on the higher end, received labetalol and hydralazine as needed IV.  Has passed a swallow on nectar thick diet, resume home BP medication.  PT/OT recommend CIR.  For detailed assessment and plan, please refer to above as I have made changes wherever appropriate.   Marvel Plan, MD PhD Stroke Neurology 09/28/2021 6:53 PM  This patient is critically ill due to ICH with IVH, hypertensive emergency, respiratory failure and at significant risk of neurological worsening, death form hematoma expansion, brain edema, herniation. This patient's care requires constant monitoring of vital signs, hemodynamics, respiratory and cardiac monitoring, review of multiple databases, neurological assessment, discussion with family, other  specialists and medical decision making of high complexity. I spent 30 minutes of neurocritical care time in the care of this patient.    To contact Stroke Continuity provider, please refer to [email or web address]. After hours, contact General Neurology

## 2021-09-29 DIAGNOSIS — I10 Essential (primary) hypertension: Secondary | ICD-10-CM

## 2021-09-29 DIAGNOSIS — I669 Occlusion and stenosis of unspecified cerebral artery: Secondary | ICD-10-CM | POA: Diagnosis not present

## 2021-09-29 DIAGNOSIS — H538 Other visual disturbances: Secondary | ICD-10-CM

## 2021-09-29 DIAGNOSIS — I619 Nontraumatic intracerebral hemorrhage, unspecified: Secondary | ICD-10-CM | POA: Diagnosis not present

## 2021-09-29 DIAGNOSIS — R1310 Dysphagia, unspecified: Secondary | ICD-10-CM

## 2021-09-29 DIAGNOSIS — I480 Paroxysmal atrial fibrillation: Secondary | ICD-10-CM | POA: Diagnosis not present

## 2021-09-29 DIAGNOSIS — K219 Gastro-esophageal reflux disease without esophagitis: Secondary | ICD-10-CM

## 2021-09-29 DIAGNOSIS — K5900 Constipation, unspecified: Secondary | ICD-10-CM

## 2021-09-29 LAB — BASIC METABOLIC PANEL
Anion gap: 8 (ref 5–15)
BUN: 12 mg/dL (ref 8–23)
CO2: 24 mmol/L (ref 22–32)
Calcium: 8.8 mg/dL — ABNORMAL LOW (ref 8.9–10.3)
Chloride: 108 mmol/L (ref 98–111)
Creatinine, Ser: 0.6 mg/dL (ref 0.44–1.00)
GFR, Estimated: >60 mL/min (ref >60)
Glucose, Bld: 112 mg/dL — ABNORMAL HIGH (ref 70–99)
Potassium: 3.5 mmol/L (ref 3.5–5.1)
Sodium: 140 mmol/L (ref 135–145)

## 2021-09-29 LAB — PHOSPHORUS: Phosphorus: 2.7 mg/dL (ref 2.5–4.6)

## 2021-09-29 LAB — CBC
HCT: 33.9 % — ABNORMAL LOW (ref 36.0–46.0)
Hemoglobin: 10.9 g/dL — ABNORMAL LOW (ref 12.0–15.0)
MCH: 29.9 pg (ref 26.0–34.0)
MCHC: 32.2 g/dL (ref 30.0–36.0)
MCV: 93.1 fL (ref 80.0–100.0)
Platelets: 212 10*3/uL (ref 150–400)
RBC: 3.64 MIL/uL — ABNORMAL LOW (ref 3.87–5.11)
RDW: 14.6 % (ref 11.5–15.5)
WBC: 7.6 10*3/uL (ref 4.0–10.5)
nRBC: 0 % (ref 0.0–0.2)

## 2021-09-29 LAB — MAGNESIUM: Magnesium: 2.2 mg/dL (ref 1.7–2.4)

## 2021-09-29 MED ORDER — POTASSIUM CHLORIDE 20 MEQ PO PACK
40.0000 meq | PACK | Freq: Once | ORAL | Status: AC
  Administered 2021-09-29: 40 meq via ORAL
  Filled 2021-09-29: qty 2

## 2021-09-29 MED ORDER — MELATONIN 3 MG PO TABS
3.0000 mg | ORAL_TABLET | Freq: Every day | ORAL | Status: DC
  Administered 2021-09-29 – 2021-09-30 (×2): 3 mg via ORAL
  Filled 2021-09-29 (×2): qty 1

## 2021-09-29 MED ORDER — POLYETHYLENE GLYCOL 3350 17 G PO PACK
17.0000 g | PACK | Freq: Two times a day (BID) | ORAL | Status: DC
  Administered 2021-09-30 (×2): 17 g via ORAL
  Filled 2021-09-29 (×5): qty 1

## 2021-09-29 MED ORDER — NITROFURANTOIN MACROCRYSTAL 50 MG PO CAPS
50.0000 mg | ORAL_CAPSULE | Freq: Every day | ORAL | Status: DC
  Administered 2021-09-29 – 2021-10-01 (×3): 50 mg via ORAL
  Filled 2021-09-29 (×3): qty 1

## 2021-09-29 NOTE — Progress Notes (Signed)
PROGRESS NOTE  Charlotte Hunter BTD:[id number] DOB: Jun 18, 1955   PCP: Audery Amel, MD  Patient is from: Home  DOA: 09/25/2021 LOS: 4  Chief complaints Code stroke    Brief Narrative / Interim history: 66 year old F with PMH of PAF on Eliquis, HTN, HLD, aortic insufficiency, GERD and frequent UTI presented to [facility] on 6/13 as a code stroke with left-sided weakness, headache and nausea, and found to have 4 mL acute intraparenchymal hemorrhage centered at the right thalamus with 1 to 2 mm right-to-left midline shift but no hydrocephalus.  Patient was intubated in ED, Andexxa administered to reverse anticoagulation.  Started on Cleviprex gtt and hypertonic saline 3%, and transferred to [facility] neuro ICU.  MRI brain on 6/15 with unchanged size and morphology of IPH, surrounding edema and mildly increased regional mass effect, intraventricular extension with a small volume intraventricular hemorrhage without hydrocephalus or trapping.  MRA head with focal severe distal left P2 stenosis.  Carotid US and TTE without significant finding.    Patient was extubated on 6/15, and transferred to [facility] hospitalist service on 6/17.  SLP recommended dysphagia 3 diet.  Therapy recommended CIR.   Patient is medically optimized for transfer to CIR once bed available.  Subjective: Seen and examined earlier this morning.  No major events overnight of this morning.  She states she has not had a bowel movement.  She states she is determined to get better and get on her feet.  She is asking how long it could take.  She says she started to regain some sensation in her left lips.  Still with significant weakness on the left side.  She also reports blurry vision in both eyes that does not improve with covering 1 eye.  Objective: Vitals:   09/29/21 1000 09/29/21 1100 09/29/21 1123 09/29/21 1138  BP: (!) 115/100  (!) 149/66   Pulse: 79 65 71   Resp: 17 19 (!) 22   Temp:    98.5 F (36.9 C)  TempSrc:    Axillary   SpO2: 98% 94% 96%   Weight:      Height:        Examination:  GENERAL: No apparent distress.  Nontoxic. HEENT: MMM.  Vision and hearing grossly intact.  NECK: Supple.  No apparent JVD.  RESP:  No IWOB.  Fair aeration bilaterally. CVS:  RRR. Heart sounds normal.  ABD/GI/GU: BS+. Abd soft, NTND.  MSK/EXT:  Moves extremities. No apparent deformity. No edema.  SKIN: no apparent skin lesion or wound NEURO: Awake, alert and oriented appropriately.  PERRL.  No facial asymmetry.  Speech clear.  Left hemiparesis and paresthesia. PSYCH: Calm. Normal affect.   Procedures:  None  Microbiology summarized: MRSA PCR screen nonreactive.  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  -Intubated for airway protection from 6/13-6/15. -Neurology following. -SBP goal <160 -Continue holding home Eliquis -Therapy recommended CIR  Dysphagia: Due to stroke. -Dysphagia 3 diet per SLP  Paroxysmal A-fib: S/p ablation.  Currently in sinus rhythm.  Reportedly heart flutter detected on outpatient monitoring. -Continue holding anticoagulation -Optimize electrolytes -Telemetry monitoring  Focal severe distal left P2 stenosis-contributing to blurry vision? -Blood pressure goal as above  Blurry vision: By ocular.  Does not improve  with covering 1 eye. -Could be from CVA but benefit from ophthalmology evaluation outpatient.  Essential hypertension: BP within acceptable range -Continue losartan and metoprolol -IV hydralazine as needed -Goal SBP <160 per neurology.  Constipation -Continue Senokot-S twice daily -Add MiraLAX twice daily until she has bowel  movement  Hypokalemia/hypophosphatemia: Resolved.  GERD -Continue PPI    DVT prophylaxis:  SCD's Start: 09/25/21 2319  Code Status: Full code Family Communication: Patient's daughter did not answer phone call. Level of care: Telemetry Medical Status is: Inpatient Remains inpatient appropriate because: Safe disposition/CIR bed   Final disposition: CIR Consultants:  Neurology admitted patient. PCCM followed patient in ICU  Sch Meds:  Scheduled Meds:  Chlorhexidine Gluconate Cloth  6 each Topical Daily   losartan  50 mg Oral Daily   metoprolol tartrate  25 mg Oral BID   multivitamin with minerals  1 tablet Oral Daily   mouth rinse  15 mL Mouth Rinse 4 times per day   pantoprazole  40 mg Oral QHS   polyethylene glycol  17 g Oral BID   senna-docusate  1 tablet Oral BID   Continuous Infusions:  sodium chloride Stopped (09/28/21 0933)   PRN Meds:.acetaminophen **OR** acetaminophen (TYLENOL) oral liquid 160 mg/5 mL **OR** acetaminophen, hydrALAZINE, Muscle Rub, ondansetron (ZOFRAN) IV, mouth rinse, polyvinyl alcohol  Antimicrobials: Anti-infectives (From admission, onward)    None        I have personally reviewed the following labs and images: CBC: Recent Labs  Lab 09/25/21 1442 09/25/21 2321 09/27/21 0534 09/28/21 0354 09/29/21 0250  WBC 8.8  --  8.7 9.2 7.6  NEUTROABS 4.7  --   --   --   --   HGB 12.0 9.9* 10.3* 10.3* 10.9*  HCT 37.4 29.0* 32.1* 32.2* 33.9*  MCV 91.4  --  94.1 93.9 93.1  PLT 273  --  190 197 212   BMP &GFR Recent Labs  Lab 09/25/21 1442 09/25/21 2236 09/25/21 2321 09/26/21 0424 09/26/21 1001 09/27/21 0534 09/28/21 0354 09/29/21 0250  NA 138   < > 143 144 145 145 141 140  K 3.2*  --  4.1  --  3.9 3.2* 3.3* 3.5  CL 103  --   --   --  117* 114* 111 108  CO2 23  --   --   --  21* 23 24 24   GLUCOSE 113*  --   --   --  111* 100* 89 112*  BUN 13  --   --   --  13 14 13 12   CREATININE 0.66  --   --   --  0.64 0.59 0.69 0.60   CALCIUM 9.4  --   --   --  8.4* 8.8* 8.6* 8.8*  MG  --   --   --   --   --   --  2.2 2.2  PHOS  --   --   --   --   --   --  1.9* 2.7   < > = values in this interval not displayed.   Estimated Creatinine Clearance: 74.7 mL/min (by C-G formula based on SCr of 0.6 mg/dL). Liver & Pancreas: Recent Labs  Lab 09/25/21 1442  AST 20  ALT 16  ALKPHOS 78  BILITOT 0.7  PROT 7.7  ALBUMIN 4.3   No results for input(s): "LIPASE", "AMYLASE" in the last 168 hours. No results for input(s): "AMMONIA" in the last 168 hours. Diabetic: No results for input(s): "HGBA1C" in the last  72 hours. Recent Labs  Lab 09/25/21 1439  GLUCAP 108*   Cardiac Enzymes: No results for input(s): "CKTOTAL", "CKMB", "CKMBINDEX", "TROPONINI" in the last 168 hours. No results for input(s): "PROBNP" in the last 8760 hours. Coagulation Profile: Recent Labs  Lab 09/25/21 1442  INR 1.2   Thyroid Function Tests: No results for input(s): "TSH", "T4TOTAL", "FREET4", "T3FREE", "THYROIDAB" in the last 72 hours. Lipid Profile: Recent Labs    09/27/21 0534  CHOL 211*  HDL 41  LDLCALC 134*  TRIG 180*  CHOLHDL 5.1   Anemia Panel: No results for input(s): "VITAMINB12", "FOLATE", "FERRITIN", "TIBC", "IRON", "RETICCTPCT" in the last 72 hours. Urine analysis:    Component Value Date/Time   COLORURINE YELLOW (A) 09/25/2021 1559   APPEARANCEUR CLEAR (A) 09/25/2021 1559   LABSPEC 1.010 09/25/2021 1559   PHURINE 6.0 09/25/2021 1559   GLUCOSEU NEGATIVE 09/25/2021 1559   HGBUR NEGATIVE 09/25/2021 1559   BILIRUBINUR NEGATIVE 09/25/2021 1559   KETONESUR 5 (A) 09/25/2021 1559   PROTEINUR NEGATIVE 09/25/2021 1559   NITRITE NEGATIVE 09/25/2021 1559   LEUKOCYTESUR NEGATIVE 09/25/2021 1559   Sepsis Labs: Invalid input(s): "PROCALCITONIN", "LACTICIDVEN"  Microbiology: Recent Results (from the past 240 hour(s))  MRSA Next Gen by PCR, Nasal     Status: None   Collection Time: 09/25/21 10:12 PM   Specimen: Nasal Mucosa;  Nasal Swab  Result Value Ref Range Status   MRSA by PCR Next Gen NOT DETECTED NOT DETECTED Final    Comment: (NOTE) The GeneXpert MRSA Assay (FDA approved for NASAL specimens only), is one component of a comprehensive MRSA colonization surveillance program. It is not intended to diagnose MRSA infection nor to guide or monitor treatment for MRSA infections. Test performance is not FDA approved in patients less than 65 years old. Performed at [facility], [place] [address], [place], [place] [address]     Radiology Studies: No results found.    Larkyn Greenberger T. [place]  If 7PM-7AM, please contact night-coverage [email or web address] 09/29/2021, 1:06 PM

## 2021-09-29 NOTE — Progress Notes (Signed)
Physical Therapy Treatment Patient Details Name: Charlotte Hunter MRN: [id number] DOB: 1955-05-08 Today's Date: 09/29/2021   History of Present Illness 66 y.o. female presents to [facility] hospital on 09/25/21 with L sided weakness. 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  Pt required intubation 6/13-6/15. PMH includes HTN, PAF, aortic insufficiency.    PT Comments    Pt is progressing with mobility, transferred multiple times today with two person assist.  Daughter in room and very involved in care, stating she is moving to her mom's home in [place] to care for her.  CIR remains an appropriate d/c plan.  PT will continue to follow acutely for safe mobility progression.   Recommendations for follow up therapy are one component of a multi-disciplinary discharge planning process, led by the attending physician.  Recommendations may be updated based on patient status, additional functional criteria and insurance authorization.  Follow Up Recommendations  Acute inpatient rehab (3hours/day)     Assistance Recommended at Discharge Frequent or constant Supervision/Assistance  Patient can return home with the following Two people to help with walking and/or transfers;Two people to help with bathing/dressing/bathroom;Assistance with cooking/housework;Assistance with feeding;Direct supervision/assist for medications management;Direct supervision/assist for financial management;Assist for transportation;Help with stairs or ramp for entrance   Equipment Recommendations       Recommendations for Other Services Rehab consult     Precautions / Restrictions Precautions Precautions: Fall Precaution Comments: L hemiplegia, L inattention     Mobility  Bed Mobility Overal bed mobility: Needs Assistance Bed Mobility: Supine to Sit     Supine to sit: Max assist, HOB elevated, +2 for physical assistance     General bed mobility comments: Max assist to support trunk and progress left leg and  hips to EOB.  Cues to assist her left arm (not leave it behind) by grabbing the wrist.  initially left push, corrected with cues and change in R hand positioning.  Assist needed to scoot at L hip and cues for safety not to continue to scoot right hip off of the edge of the bed.    Transfers Overall transfer level: Needs assistance Equipment used: 2 person hand held assist Transfers: Sit to/from Stand Sit to Stand: Max assist, +2 physical assistance, From elevated surface Stand pivot transfers: Max assist, +2 physical assistance, From elevated surface         General transfer comment: Two person max assist to stand from elevated bed and elevated BSC to pivot to pt's right to [facility] and recliner chair.  Cues for right weight shift, significant blocking of left knee and manual movement of L foot to pivot around.  Pt did nto demonstrate any active movement in left leg with mobility.    Ambulation/Gait                   Stairs             Wheelchair Mobility    Modified Rankin (Stroke Patients Only) Modified Rankin (Stroke Patients Only) Pre-Morbid Rankin Score: No symptoms Modified Rankin: Severe disability     Balance Overall balance assessment: Needs assistance Sitting-balance support: Feet supported, Single extremity supported Sitting balance-Leahy Scale: Poor Sitting balance - Comments: Up to max assist sitting EOB depending on pt's attention to not leaning left and R UE hand placement. Postural control: Left lateral lean Standing balance support: Single extremity supported Standing balance-Leahy Scale: Poor Standing balance comment: maxA, left lean  Cognition Arousal/Alertness: Awake/alert Behavior During Therapy: WFL for tasks assessed/performed Overall Cognitive Status: Impaired/Different from baseline Area of Impairment: Attention, Following commands, Safety/judgement, Awareness, Problem solving                    Current Attention Level: Sustained (however, very distractable) Memory: Decreased recall of precautions Following Commands: Follows one step commands consistently, Follows multi-step commands inconsistently Safety/Judgement: Decreased awareness of safety, Decreased awareness of deficits Awareness: Emergent Problem Solving: Difficulty sequencing, Requires verbal cues, Requires tactile cues General Comments: Pt responds well to basic cues, however, is distractable and needs redirection frequently throughout session with PT/OT and daughter in room.  Pt needs cues to attend to left side, but does when cued, asked daughter to sit on her left when able.        Exercises      General Comments        Pertinent Vitals/Pain Pain Assessment Pain Assessment: Faces Faces Pain Scale: Hurts even more Pain Location: right side burning pain Pain Descriptors / Indicators: Grimacing Pain Intervention(s): Limited activity within patient's tolerance, Monitored during session, Repositioned    Home Living   Living Arrangements: Alone   Type of Home: House Home Access: Stairs to enter Entrance Stairs-Rails: Can reach both Entrance Stairs-Number of Steps: 4   Home Layout: One level        Prior Function            PT Goals (current goals can now be found in the care plan section) Acute Rehab PT Goals Patient Stated Goal: to improve mobility quality in an effort to return to independence Progress towards PT goals: Progressing toward goals    Frequency    Min 4X/week      PT Plan Current plan remains appropriate    Co-evaluation PT/OT/SLP Co-Evaluation/Treatment: Yes Reason for Co-Treatment: Complexity of the patient's impairments (multi-system involvement);Necessary to address cognition/behavior during functional activity;For patient/therapist safety;To address functional/ADL transfers PT goals addressed during session: Mobility/safety with mobility;Balance;Strengthening/ROM         AM-PAC PT "6 Clicks" Mobility   Outcome Measure  Help needed turning from your back to your side while in a flat bed without using bedrails?: A Lot Help needed moving from lying on your back to sitting on the side of a flat bed without using bedrails?: Total Help needed moving to and from a bed to a chair (including a wheelchair)?: Total Help needed standing up from a chair using your arms (e.g., wheelchair or bedside chair)?: Total Help needed to walk in hospital room?: Total Help needed climbing 3-5 steps with a railing? : Total 6 Click Score: 7    End of Session Equipment Utilized During Treatment: Gait belt Activity Tolerance: Patient limited by pain;Patient limited by fatigue Patient left: in chair;with call bell/phone within reach;with family/visitor present Nurse Communication: Mobility status;Need for lift equipment PT Visit Diagnosis: Other abnormalities of gait and mobility (R26.89);Other symptoms and signs involving the nervous system (R29.898);Hemiplegia and hemiparesis Hemiplegia - Right/Left: Left Hemiplegia - dominant/non-dominant: Non-dominant Hemiplegia - caused by: Nontraumatic intracerebral hemorrhage     Time: 1218-1301 PT Time Calculation (min) (ACUTE ONLY): 43 min  Charges:  $Therapeutic Activity: 8-22 mins                    Corinna Capra, PT, DPT  Acute Rehabilitation  [phone] pager [phone]) [phone] office

## 2021-09-29 NOTE — PMR Pre-admission (Signed)
PMR Admission Coordinator Pre-Admission Assessment  Patient: Charlotte Hunter is an 66 y.o., female MRN: [id number] DOB: [birth year] Height: 5\' 6"  (167.6 cm) Weight: 82.1 kg  Insurance Information HMO:     PPO:      PCP:      IPA:      80/20: yes     OTHER:  PRIMARY: Medicare A & B      Policy#: [id number]      Subscriber: patient CM Name:       Phone#:      Fax#:  Pre-Cert#:       Employer:  Benefits:  Phone #: verified eligibility via [employer] on 09/29/21     Name:  Eff. Date: Part A effective 10/13/16, Part B effective 11/13/17     Deduct: $1,600      Out of Pocket Max: NA      Life Max: NA CIR: 100% coverage      SNF: 100% coverage days 1-20, 80% coverage days 21-100 Outpatient: 80% coverage     Co-Pay: 20% Home Health: 100% coverage      Co-Pay:  DME: 80% coverage     Co-Pay: 20% Providers: pt's choice SECONDARY:       Policy#:      Phone#:   Development worker, community:       Phone#:   The Therapist, art Information Summary" for patients in Inpatient Rehabilitation Facilities with attached "Privacy Act [place] Records" was provided and verbally reviewed with: {CHL IP Patient Family QP:[id number]}  Emergency Contact Information Contact Information     Name Relation Home Work Mobile   [place] Daughter [phone]         Current Medical History  Patient Admitting Diagnosis: Hemorrhagic stroke History of Present Illness: Pt is a 66 year old female with medical hx significant for: HTN, PAF, GERD, UTI. Pt [facility] d/t left side weakness, slurred speech, nausea with headache and visual disturbances. Code stroke activated. CT head showed hemorrhage in right thalamus and basal ganglia with mild surrounding edema, minimal midline shift, small amount of intraventricular extension into third and fourth ventricles. Pt intubated on 6/13. Pt transferred to [facility]. MRI showed stable hematoma and IVH, mildly increase mass  effect, no CAA. MRA showed no AVM or aneurysm. 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 Echo showed EF 65-70%. Pt extubated on 6/15. Therapy evaluations completed and CIR recommended d/t pt's deficits in functional mobility and inability to complete ADLs independently. Complete NIHSS TOTAL: 13  Patient's medical record from [facility] has been reviewed by the rehabilitation admission coordinator and physician.  Past Medical History  Past Medical History:  Diagnosis Date   A-fib [facility])    Aortic insufficiency    Hypertension     Has the patient had major surgery during 100 days prior to admission? No  Family History   family history is not on file.  Current Medications  Current Facility-Administered Medications:    0.9 %  sodium chloride infusion, , Intravenous, Continuous, Rosalin Hawking, MD, Stopped at 09/28/21 0933   acetaminophen (TYLENOL) tablet 650 mg, 650 mg, Oral, Q4H PRN, 650 mg at 09/29/21 1509 **OR** acetaminophen (TYLENOL) 160 MG/5ML solution 650 mg, 650 mg, Per Tube, Q4H PRN, 650 mg at 09/26/21 1949 **OR** acetaminophen (TYLENOL) suppository 650 mg, 650 mg, Rectal, Q4H PRN, Kerney Elbe, MD, 650 mg at 09/27/21 1746   Chlorhexidine Gluconate Cloth 2 % PADS 6 each, 6 each, Topical, Daily, Kerney Elbe,  MD, 6 each at 09/29/21 0927   hydrALAZINE (APRESOLINE) injection 10 mg, 10 mg, Intravenous, Q4H PRN, Omar Person, NP   losartan (COZAAR) tablet 50 mg, 50 mg, Oral, Daily, Beldon, Brianna S, RPH, 50 mg at 09/29/21 0928   metoprolol tartrate (LOPRESSOR) tablet 25 mg, 25 mg, Oral, BID, Beldon, Brianna S, RPH, 25 mg at 09/29/21 6789   multivitamin with minerals tablet 1 tablet, 1 tablet, Oral, Daily, Beulah Gandy A, NP, 1 tablet at 09/29/21 3810   Muscle Rub CREA, , Topical, PRN, Rosalin Hawking, MD, Given at 09/27/21 2102   ondansetron (ZOFRAN) injection 4 mg, 4 mg, Intravenous, Q6H PRN, Gerald Leitz D, NP, 4 mg at 09/28/21 1145   Oral care mouth rinse, 15 mL,  Mouth Rinse, 4 times per day, Rosalin Hawking, MD, 15 mL at 09/29/21 1200   Oral care mouth rinse, 15 mL, Mouth Rinse, PRN, Rosalin Hawking, MD   pantoprazole (PROTONIX) EC tablet 40 mg, 40 mg, Oral, QHS, Beldon, Brianna S, RPH, 40 mg at 09/28/21 2310   polyethylene glycol (MIRALAX / GLYCOLAX) packet 17 g, 17 g, Oral, BID, Gonfa, Taye T, MD   polyvinyl alcohol (LIQUIFILM TEARS) 1.4 % ophthalmic solution 1 drop, 1 drop, Both Eyes, PRN, Dewaine Oats, Katalina M, NP, 1 drop at 09/29/21 1118   senna-docusate (Senokot-S) tablet 1 tablet, 1 tablet, Oral, BID, Beldon, Consuello Masse, RPH, 1 tablet at 09/29/21 0930  Patients Current Diet:  Diet Order             DIET DYS 3 Room service appropriate? Yes with Assist; Fluid consistency: Nectar Thick  Diet effective now                   Precautions / Restrictions Precautions Precautions: Fall Precaution Comments: L hemiplegia, L inattention Restrictions Weight Bearing Restrictions: No   Has the patient had 2 or more falls or a fall with injury in the past year? No  Prior Activity Level Community (5-7x/wk): gets out of house 3-4 days/week; drives  Prior Functional Level Self Care: Did the patient need help bathing, dressing, using the toilet or eating? Independent  Indoor Mobility: Did the patient need assistance with walking from room to room (with or without device)? Independent  Stairs: Did the patient need assistance with internal or external stairs (with or without device)? Independent  Functional Cognition: Did the patient need help planning regular tasks such as shopping or remembering to take medications? Independent  Patient Information Are you of Hispanic, Latino/a,or Spanish origin?: A. No, not of Hispanic, Latino/a, or Spanish origin What is your race?: A. White Do you need or want an interpreter to communicate with a doctor or health care staff?: 0. No  Patient's Response To:  Health Literacy and Transportation Is the patient able to  respond to health literacy and transportation needs?: Yes Health Literacy - How often do you need to have someone help you when you read instructions, pamphlets, or other written material from your doctor or pharmacy?: Never In the past 12 months, has lack of transportation kept you from medical appointments or from getting medications?: No In the past 12 months, has lack of transportation kept you from meetings, work, or from getting things needed for daily living?: No  Home Assistive Devices / Equipment Home Equipment: None  Prior Device Use: Indicate devices/aids used by the patient prior to current illness, exacerbation or injury? None of the above  Current Functional Level Cognition  Arousal/Alertness: Awake/alert Overall Cognitive Status: Impaired/Different from  baseline Current Attention Level: Sustained Orientation Level: Oriented X4 Following Commands: Follows one step commands consistently, Follows multi-step commands inconsistently Safety/Judgement: Decreased awareness of safety, Decreased awareness of deficits General Comments: Pt responds well to basic cues, however, is distractable and needs redirection frequently throughout session with PT/OT and daughter in room.  Pt needs cues to attend to left side, but does when cued, asked daughter to sit on her left when able. Attention: Sustained Sustained Attention: Impaired Sustained Attention Impairment: Verbal basic Memory: Impaired Memory Impairment: Decreased recall of new information Awareness: Impaired Awareness Impairment: Intellectual impairment Problem Solving: Appears intact (verbal basic)    Extremity Assessment (includes Sensation/Coordination)  Upper Extremity Assessment: LUE deficits/detail LUE Deficits / Details: PROM WFL, no trace activation noted. LUE: Subluxation noted LUE Sensation: decreased light touch, decreased proprioception LUE Coordination: decreased fine motor, decreased gross motor  Lower  Extremity Assessment: Defer to PT evaluation LLE Deficits / Details: PROM WFL, flaccid    ADLs  Overall ADL's : Needs assistance/impaired Eating/Feeding: Supervision/ safety, Sitting Eating/Feeding Details (indicate cue type and reason): nectar thick liquids Grooming: Minimal assistance, Sitting Upper Body Bathing: Moderate assistance, Sitting Lower Body Bathing: Maximal assistance, +2 for physical assistance, +2 for safety/equipment, Sit to/from stand Upper Body Dressing : Moderate assistance, Sitting Lower Body Dressing: Maximal assistance, +2 for physical assistance, +2 for safety/equipment, Sit to/from stand Toilet Transfer: Maximal assistance, +2 for physical assistance, +2 for safety/equipment, BSC/3in1, Stand-pivot Toilet Transfer Details (indicate cue type and reason): bed>BSC this session, cues required Toileting- Clothing Manipulation and Hygiene: Maximal assistance, +2 for physical assistance, +2 for safety/equipment, Sit to/from stand Functional mobility during ADLs: Maximal assistance, +2 for physical assistance, +2 for safety/equipment, Cueing for sequencing General ADL Comments: limited by L hemi, L lateral lean, decreased activity tolerance, imapired cognition    Mobility  Overal bed mobility: Needs Assistance Bed Mobility: Supine to Sit Supine to sit: Max assist, HOB elevated, +2 for physical assistance General bed mobility comments: Max assist to support trunk and progress left leg and hips to EOB.  Cues to assist her left arm (not leave it behind) by grabbing the wrist.  initially left push, corrected with cues and change in R hand positioning.  Assist needed to scoot at L hip and cues for safety not to continue to scoot right hip off of the edge of the bed.    Transfers  Overall transfer level: Needs assistance Equipment used: 2 person hand held assist Transfers: Sit to/from Stand Sit to Stand: Max assist, +2 physical assistance, From elevated surface Bed to/from  chair/wheelchair/BSC transfer type:: Stand pivot Stand pivot transfers: Max assist, +2 physical assistance, From elevated surface General transfer comment: Two person max assist to stand from elevated bed and elevated BSC to pivot to pt's right to [facility] [place] and recliner chair.  Cues for right weight shift, significant blocking of left knee and manual movement of L foot to pivot around.  Pt did nto demonstrate any active movement in left leg with mobility.    Ambulation / Gait / Stairs / Office manager / Balance Dynamic Sitting Balance Sitting balance - Comments: Up to max assist sitting EOB depending on pt's attention to not leaning left and R UE hand placement. Balance Overall balance assessment: Needs assistance Sitting-balance support: Feet supported, Single extremity supported Sitting balance-Leahy Scale: Poor Sitting balance - Comments: Up to max assist sitting EOB depending on pt's attention to not leaning left and R UE hand placement. Postural  control: Left lateral lean Standing balance support: Single extremity supported Standing balance-Leahy Scale: Poor Standing balance comment: maxA, left lean    Special needs/care consideration Continuous Drip IV  0.9% sodium chloride infusion, Bladder incontinence, External urinary catheter   Previous Home Environment (from acute therapy documentation) Living Arrangements: Alone  Lives With: Alone Available Help at Discharge: Family, Available 24 hours/day Type of Home: House Home Layout: One level Home Access: Stairs to enter Entrance Stairs-Rails: Can reach both Entrance Stairs-Number of Steps: 4 Bathroom Shower/Tub: Multimedia programmer: Handicapped height Bathroom Accessibility: Yes How Accessible: Accessible via walker Home Care Services: No Additional Comments: daughter is planning to stay with pt  Discharge Living Setting Plans for Discharge Living Setting: Patient's home Type of Home at Discharge:  House Discharge Home Layout: One level Discharge Home Access: Stairs to enter Entrance Stairs-Rails: Can reach both Entrance Stairs-Number of Steps: 4 Discharge Bathroom Shower/Tub: Walk-in shower Discharge Bathroom Toilet: Handicapped height Discharge Bathroom Accessibility: Yes How Accessible: Accessible via walker Does the patient have any problems obtaining your medications?: Yes (Describe) (some medication is expensive)  Social/Family/Support Systems Anticipated Caregiver: Josslyn Ciolek, daughter Anticipated Caregiver's Contact Information: [phone] Caregiver Availability: Intermittent Discharge Plan Discussed with Primary Caregiver: Yes Is Caregiver In Agreement with Plan?: Yes Does Caregiver/Family have Issues with Lodging/Transportation while Pt is in Rehab?: No  Goals Patient/Family Goal for Rehab: *** Expected length of stay: *** Pt/Family Agrees to Admission and willing to participate: Yes Program Orientation Provided & Reviewed with Pt/Caregiver Including Roles  & Responsibilities: Yes  Decrease burden of Care through IP rehab admission: NA  Possible need for SNF placement upon discharge: Not anticipated  Patient Condition: I have reviewed medical records from [facility], spoken with {CHL IP CSW KM:[id number]}, and patient and daughter. I met with patient at the bedside and discussed via phone for inpatient rehabilitation assessment.  Patient will benefit from ongoing PT, OT, and SLP, can actively participate in 3 hours of therapy a day 5 days of the week, and can make measurable gains during the admission.  Patient will also benefit from the coordinated team approach during an Inpatient Acute Rehabilitation admission.  The patient will receive intensive therapy as well as Rehabilitation physician, nursing, social worker, and care management interventions.  Due to bladder management, safety, disease management, medication administration, pain management, and patient  education the patient requires 24 hour a day rehabilitation nursing.  The patient is currently *** with mobility and basic ADLs.  Discharge setting and therapy post discharge at home with home health is anticipated.  Patient has agreed to participate in the Acute Inpatient Rehabilitation Program and will admit {Time; today/tomorrow:10263}.  Preadmission Screen Completed By:  Bethel Born, 09/29/2021 5:35 PM ______________________________________________________________________   Discussed status with Dr. Marland Kitchen on *** at *** and received approval for admission today.  Admission Coordinator:  Bethel Born, CCC-SLP, time ***/Date ***   Assessment/Plan: Diagnosis: Does the need for close, 24 hr/day Medical supervision in concert with the patient's rehab needs make it unreasonable for this patient to be served in a less intensive setting? {yes_no_potentially:3041433} Co-Morbidities requiring supervision/potential complications: *** Due to {due FB:9038333}, does the patient require 24 hr/day rehab nursing? {yes_no_potentially:3041433} Does the patient require coordinated care of a physician, rehab nurse, PT, OT, and SLP to address physical and functional deficits in the context of the above medical diagnosis(es)? {yes_no_potentially:3041433} Addressing deficits in the following areas: {deficits:3041436} Can the patient actively participate in an intensive therapy program of  at least 3 hrs of therapy 5 days a week? {yes_no_potentially:3041433} The potential for patient to make measurable gains while on inpatient rehab is {potential:3041437} Anticipated functional outcomes upon discharge from inpatient rehab: {functional outcomes:304600100} PT, {functional outcomes:304600100} OT, {functional outcomes:304600100} SLP Estimated rehab length of stay to reach the above functional goals is: *** Anticipated discharge destination: {anticipated dc setting:21604} 10. Overall Rehab/Functional  Prognosis: {potential:3041437}   MD Signature: ***

## 2021-09-29 NOTE — Progress Notes (Addendum)
STROKE TEAM PROGRESS NOTE   SUBJECTIVE (INTERVAL HISTORY) Patient is awake and alert on RA in NAD. RN at bedside. She follows commands. No aphasia or dysarthria. Voice still hypophonic but improved since yesterday. Left arm is flaccid. Left leg with  some movement, decreased sensation on left. Right arm and leg 5/5. Tracks EOMI, Visual fields are full. She looks good. Still waiting on a floor bed. BP has been stable. No new neurological events overnight. Vital signs are stable.  Blood pressure adequately controlled.  OBJECTIVE Temp:  [98.1 F (36.7 C)-99.3 F (37.4 C)] 99 F (37.2 C) (06/17 0400) Pulse Rate:  [55-91] 77 (06/17 0700) Cardiac Rhythm: Normal sinus rhythm (06/16 2000) Resp:  [11-31] 21 (06/17 0700) BP: (122-162)/(54-81) 153/81 (06/17 0700) SpO2:  [89 %-97 %] 91 % (06/17 0700)  Recent Labs  Lab 09/25/21 1439  GLUCAP 108*    Recent Labs  Lab 09/25/21 1442 09/25/21 2236 09/25/21 2321 09/26/21 0424 09/26/21 1001 09/27/21 0534 09/28/21 0354 09/29/21 0250  NA 138   < > 143 144 145 145 141 140  K 3.2*  --  4.1  --  3.9 3.2* 3.3* 3.5  CL 103  --   --   --  117* 114* 111 108  CO2 23  --   --   --  21* 23 24 24   GLUCOSE 113*  --   --   --  111* 100* 89 112*  BUN 13  --   --   --  13 14 13 12   CREATININE 0.66  --   --   --  0.64 0.59 0.69 0.60  CALCIUM 9.4  --   --   --  8.4* 8.8* 8.6* 8.8*  MG  --   --   --   --   --   --  2.2 2.2  PHOS  --   --   --   --   --   --  1.9* 2.7   < > = values in this interval not displayed.    Recent Labs  Lab 09/25/21 1442  AST 20  ALT 16  ALKPHOS 78  BILITOT 0.7  PROT 7.7  ALBUMIN 4.3    Recent Labs  Lab 09/25/21 1442 09/25/21 2321 09/27/21 0534 09/28/21 0354 09/29/21 0250  WBC 8.8  --  8.7 9.2 7.6  NEUTROABS 4.7  --   --   --   --   HGB 12.0 9.9* 10.3* 10.3* 10.9*  HCT 37.4 29.0* 32.1* 32.2* 33.9*  MCV 91.4  --  94.1 93.9 93.1  PLT 273  --  190 197 212    No results for input(s): "CKTOTAL", "CKMB",  "CKMBINDEX", "TROPONINI" in the last 168 hours. No results for input(s): "LABPROT", "INR" in the last 72 hours.  No results for input(s): "COLORURINE", "LABSPEC", "PHURINE", "GLUCOSEU", "HGBUR", "BILIRUBINUR", "KETONESUR", "PROTEINUR", "UROBILINOGEN", "NITRITE", "LEUKOCYTESUR" in the last 72 hours.  Invalid input(s): "APPERANCEUR"      Component Value Date/Time   CHOL 211 (H) 09/27/2021 0534   TRIG 180 (H) 09/27/2021 0534   HDL 41 09/27/2021 0534   CHOLHDL 5.1 09/27/2021 0534   VLDL 36 09/27/2021 0534   LDLCALC 134 (H) 09/27/2021 0534   Lab Results  Component Value Date   HGBA1C 5.9 (H) 09/25/2021   No results found for: "LABOPIA", "COCAINSCRNUR", "LABBENZ", "AMPHETMU", "THCU", "LABBARB"  No results for input(s): "ETH" in the last 168 hours.  I have personally reviewed the radiological images below and agree with the radiology  interpretations.  DG Swallowing Func-Speech Pathology  Result Date: 09/28/2021 Table formatting from the original result was not included. Objective Swallowing Evaluation: Type of Study: MBS-Modified Barium Swallow Study  Patient Details Name: Aubrey Teer MRN: [id number] Date of Birth: February 13, 1956 Today's Date: 09/28/2021 Time: SLP Start Time (ACUTE ONLY): 1210 -SLP Stop Time (ACUTE ONLY): 1233 SLP Time Calculation (min) (ACUTE ONLY): 23 min Past Medical History: Past Medical History: Diagnosis Date  A-fib [facility])   Aortic insufficiency   Hypertension  Past Surgical History: The histories are not reviewed yet. Please review them in the "History" navigator section and refresh this [place]. HPI: 66 y.o. female presents to [facility] hospital on 09/25/2021 with L weakness. 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  Pt required intubation 6/13-6/15. PMH includes HTN, PAF, GERD, UTI.  Subjective: denies trouble swallowing before  Recommendations for follow up therapy are one component of a multi-disciplinary discharge planning process, led by the attending physician.   Recommendations may be updated based on patient status, additional functional criteria and insurance authorization. Assessment / Plan / Recommendation   09/28/2021   1:00 PM Clinical Impressions Clinical Impression Pt has a mild oropharyngeal dysphagia. Her oral phase and mastication are slow with solid consistencies, and she has a tendency to hold liquids in her mouth before swallowing. Although this takes time, she does not necessarily need to be prompted to swallow. She has mildly reduced airway closure and if her timing is slightly off, thin liquids will enter her airway. This is primarily penetration but at times it rests on the vocal cords, and there is aspiration (PAS 7) on consecutive sips. A chin tuck does not improve airway protection, but thicker liquids do. Recommend starting with Dys 3 diet and nectar thick liquids.  SLP Visit Diagnosis Dysphagia, oropharyngeal phase (R13.12) Impact on safety and function Mild aspiration risk;Moderate aspiration risk     09/28/2021   1:00 PM Treatment Recommendations Treatment Recommendations Therapy as outlined in treatment plan below     09/28/2021   1:00 PM Prognosis Prognosis for Safe Diet Advancement Good   09/28/2021   1:00 PM Diet Recommendations SLP Diet Recommendations Dysphagia 3 (Mech soft) solids;Nectar thick liquid Liquid Administration via Cup;Straw Medication Administration Whole meds with puree Compensations Minimize environmental distractions;Slow rate;Small sips/bites Postural Changes Seated upright at 90 degrees;Remain semi-upright after after feeds/meals (Comment)     09/28/2021   1:00 PM Other Recommendations Oral Care Recommendations Oral care BID Follow Up Recommendations Acute inpatient rehab (3hours/day) Assistance recommended at discharge Frequent or constant Supervision/Assistance Functional Status Assessment Patient has had a recent decline in their functional status and demonstrates the ability to make significant improvements in function in a  reasonable and predictable amount of time.   09/28/2021   1:00 PM Frequency and Duration  Speech Therapy Frequency (ACUTE ONLY) min 2x/week Treatment Duration 2 weeks     09/28/2021   1:00 PM Oral Phase Oral Phase Impaired Oral - Nectar Cup Holding of bolus Oral - Nectar Straw Holding of bolus Oral - Thin Cup Holding of bolus Oral - Thin Straw Holding of bolus Oral - Puree Delayed oral transit Oral - Regular Delayed oral transit;Impaired mastication Oral - Pill Delayed oral transit    09/28/2021   1:00 PM Pharyngeal Phase Pharyngeal Phase Impaired Pharyngeal- Nectar Cup WFL Pharyngeal- Nectar Straw WFL Pharyngeal- Thin Cup Reduced airway/laryngeal closure;Penetration/Aspiration during swallow Pharyngeal Material enters airway, CONTACTS cords and not ejected out Pharyngeal- Thin Straw Penetration/Aspiration before swallow;Penetration/Aspiration during swallow;Compensatory strategies attempted (with notebox) Pharyngeal  Material enters airway, passes BELOW cords and not ejected out despite cough attempt by patient Pharyngeal- Puree WFL Pharyngeal- Regular WFL Pharyngeal- Pill [facility]    09/28/2021   1:00 PM Cervical Esophageal Phase  Cervical Esophageal Phase [facility] M.A. CCC-SLP Acute Rehabilitation Services Office [phone] Secure chat preferred 09/28/2021, 2:19 PM                     VAS US CAROTID  Result Date: 09/27/2021 Carotid Arterial Duplex Study Patient Name:  ESTIE SPROULE  Date of Exam:   09/27/2021 Medical Rec #: [id number]         Accession #:    [id number] Date of Birth: 1956-02-14         Patient Gender: F Patient Age:   35 years Exam Location:  [facility] Procedure:      VAS US CAROTID Referring Phys: Marvel Plan --------------------------------------------------------------------------------  Performing Technologist: Jean Rosenthal RDMS, RVT  Examination Guidelines: A complete evaluation includes B-mode imaging, spectral Doppler, color Doppler, and power Doppler as needed of all accessible  portions of each vessel. Bilateral testing is considered an integral part of a complete examination. Limited examinations for reoccurring indications may be performed as noted.  Right Carotid Findings: +----------+--------+--------+--------+------------------+------------------+           PSV cm/sEDV cm/sStenosisPlaque DescriptionComments           +----------+--------+--------+--------+------------------+------------------+ CCA Prox  89      13                                                   +----------+--------+--------+--------+------------------+------------------+ CCA Distal70      14                                intimal thickening +----------+--------+--------+--------+------------------+------------------+ ICA Prox  65      16                                intimal thickening +----------+--------+--------+--------+------------------+------------------+ ICA Mid   98      26                                                   +----------+--------+--------+--------+------------------+------------------+ ICA Distal93      29                                                   +----------+--------+--------+--------+------------------+------------------+ ECA       93      7                                                    +----------+--------+--------+--------+------------------+------------------+ +----------+--------+-------+---------+-------------------+           PSV cm/sEDV cmsDescribe Arm Pressure (mmHG) +----------+--------+-------+---------+-------------------+ [id number]  Turbulent                    +----------+--------+-------+---------+-------------------+ +---------+--------+--+--------+--+---------+ VertebralPSV cm/s73EDV cm/s21Antegrade +---------+--------+--+--------+--+---------+  Left Carotid Findings: +----------+--------+--------+--------+------------------+------------------+           PSV cm/sEDV  cm/sStenosisPlaque DescriptionComments           +----------+--------+--------+--------+------------------+------------------+ CCA Prox  102     20                                                   +----------+--------+--------+--------+------------------+------------------+ CCA Distal90      19                                                   +----------+--------+--------+--------+------------------+------------------+ ICA Prox  68      15                                intimal thickening +----------+--------+--------+--------+------------------+------------------+ ICA Mid   129     29                                                   +----------+--------+--------+--------+------------------+------------------+ ICA Distal133     36                                                   +----------+--------+--------+--------+------------------+------------------+ ECA       86      11                                                   +----------+--------+--------+--------+------------------+------------------+ +----------+--------+--------+---------+-------------------+           PSV cm/sEDV cm/sDescribe Arm Pressure (mmHG) +----------+--------+--------+---------+-------------------+ [id number]             Turbulent                    +----------+--------+--------+---------+-------------------+ +---------+--------+--+--------+--+---------+ VertebralPSV cm/s69EDV cm/s15Antegrade +---------+--------+--+--------+--+---------+   Summary: Right Carotid: The extracranial vessels were near-normal with only minimal wall                thickening or plaque. Left Carotid: The extracranial vessels were near-normal with only minimal wall               thickening or plaque. Vertebrals:  Bilateral vertebral arteries demonstrate antegrade flow. Subclavians: Bilateral subclavian artery flow was disturbed. *See table(s) above for measurements and observations.      Preliminary    MR BRAIN WO CONTRAST  Result Date: 09/27/2021 CLINICAL DATA:  Follow-up examination for hemorrhagic stroke. EXAM: MRI HEAD WITHOUT CONTRAST MRA HEAD WITHOUT CONTRAST TECHNIQUE: Multiplanar, multi-echo pulse sequences of the brain and surrounding structures were acquired without intravenous contrast. Angiographic images of the Circle of Willis  were acquired using MRA technique without intravenous contrast. COMPARISON:  Prior CT from 09/25/2021. FINDINGS: MRI HEAD FINDINGS Brain: Cerebral volume within normal limits. Scattered patchy T2/FLAIR hyperintensity involving the periventricular deep white matter both cerebral hemispheres, most consistent with chronic small vessel ischemic disease, moderately advanced in nature. Previously identified acute intraparenchymal hemorrhage centered at the right thalamus again seen, not significantly changed in size or morphology as compared to prior CT. Lesion measures approximately 2.9 x 1.6 x 2.2 cm by MRI. Surrounding T2/FLAIR signal consistent with edema, mildly increased from prior. Edema extends inferiorly into the right cerebral peduncle/midbrain. Mild localized right-to-left shift. Intraventricular extension with small volume intraventricular hemorrhage, similar as well. No hydrocephalus or trapping. No other evidence for acute or subacute ischemia. Focus of mild diffusion signal abnormality at the posterior left frontal centrum semi ovale felt to be related to susceptibility artifact (series 5, image 87). No other acute intracranial hemorrhage. Few scattered chronic micro hemorrhages noted, likely hypertensive in nature. No mass lesion or extra-axial fluid collection. Pituitary gland suprasellar region within normal limits. Vascular: Major intracranial vascular flow voids are maintained. Skull and upper cervical spine: Craniocervical junction within normal limits. Bone marrow signal intensity normal. Small lipoma at the right frontal scalp noted. Scalp  soft tissues otherwise unremarkable. Sinuses/Orbits: Globes orbital soft tissues within normal limits. Mild scattered mucosal thickening noted about the ethmoidal air cells. Few small left maxillary sinus retention cyst noted. Trace right mastoid effusion. Patient is intubated. Other: None. MRA HEAD FINDINGS Anterior circulation: Visualized distal cervical segments of the internal carotid arteries are patent with antegrade flow. Distal cervical right ICA is tortuous. Petrous, cavernous, supraclinoid segments patent without stenosis or other abnormality. A1 segments patent bilaterally. Normal anterior communicating artery complex. Anterior cerebral arteries patent without stenosis. No M1 stenosis or occlusion. No proximal MCA branch occlusion. Distal MCA branches perfused and symmetric. Posterior circulation: Both vertebral arteries patent without stenosis. Left vertebral artery slightly dominant. Both PICA patent. Basilar patent to its distal aspect without stenosis. Short-segment fenestration involving distal basilar artery just prior to the apex noted. Superior cerebellar arteries patent bilaterally. Both PCA supplied via the basilar as well as small bilateral posterior communicating arteries. Right PCA widely patent to its distal aspect. Focal severe distal left P2 stenosis noted (series 1033, image 4). Left PCA otherwise patent as well. Anatomic variants: None significant. No intracranial aneurysm. No other vascular malformation seen underlying the right thalamic hemorrhage. IMPRESSION: MRI HEAD IMPRESSION: 1. Unchanged size and morphology of acute intraparenchymal hemorrhage centered at the right thalamus. Surrounding edema and regional mass effect has mildly increased as compared to prior CT from 09/25/2021. 2. Intraventricular extension with small volume intraventricular hemorrhage, similar. No hydrocephalus or trapping. 3. Underlying moderate chronic microvascular ischemic disease. MRA HEAD IMPRESSION: 1.  Negative intracranial MRA for large vessel occlusion. 2. Focal severe distal left P2 stenosis. 3. Otherwise negative intracranial MRA. No other hemodynamically significant or correctable stenosis. No aneurysm or other vascular malformation seen underlying the right thalamic hemorrhage. Electronically Signed   By: Jeannine Boga M.D.   On: 09/27/2021 06:06   MR ANGIO HEAD WO CONTRAST  Result Date: 09/27/2021 CLINICAL DATA:  Follow-up examination for hemorrhagic stroke. EXAM: MRI HEAD WITHOUT CONTRAST MRA HEAD WITHOUT CONTRAST TECHNIQUE: Multiplanar, multi-echo pulse sequences of the brain and surrounding structures were acquired without intravenous contrast. Angiographic images of the Circle of Willis were acquired using MRA technique without intravenous contrast. COMPARISON:  Prior CT from 09/25/2021. FINDINGS: MRI HEAD FINDINGS Brain: Cerebral volume  within normal limits. Scattered patchy T2/FLAIR hyperintensity involving the periventricular deep white matter both cerebral hemispheres, most consistent with chronic small vessel ischemic disease, moderately advanced in nature. Previously identified acute intraparenchymal hemorrhage centered at the right thalamus again seen, not significantly changed in size or morphology as compared to prior CT. Lesion measures approximately 2.9 x 1.6 x 2.2 cm by MRI. Surrounding T2/FLAIR signal consistent with edema, mildly increased from prior. Edema extends inferiorly into the right cerebral peduncle/midbrain. Mild localized right-to-left shift. Intraventricular extension with small volume intraventricular hemorrhage, similar as well. No hydrocephalus or trapping. No other evidence for acute or subacute ischemia. Focus of mild diffusion signal abnormality at the posterior left frontal centrum semi ovale felt to be related to susceptibility artifact (series 5, image 87). No other acute intracranial hemorrhage. Few scattered chronic micro hemorrhages noted, likely  hypertensive in nature. No mass lesion or extra-axial fluid collection. Pituitary gland suprasellar region within normal limits. Vascular: Major intracranial vascular flow voids are maintained. Skull and upper cervical spine: Craniocervical junction within normal limits. Bone marrow signal intensity normal. Small lipoma at the right frontal scalp noted. Scalp soft tissues otherwise unremarkable. Sinuses/Orbits: Globes orbital soft tissues within normal limits. Mild scattered mucosal thickening noted about the ethmoidal air cells. Few small left maxillary sinus retention cyst noted. Trace right mastoid effusion. Patient is intubated. Other: None. MRA HEAD FINDINGS Anterior circulation: Visualized distal cervical segments of the internal carotid arteries are patent with antegrade flow. Distal cervical right ICA is tortuous. Petrous, cavernous, supraclinoid segments patent without stenosis or other abnormality. A1 segments patent bilaterally. Normal anterior communicating artery complex. Anterior cerebral arteries patent without stenosis. No M1 stenosis or occlusion. No proximal MCA branch occlusion. Distal MCA branches perfused and symmetric. Posterior circulation: Both vertebral arteries patent without stenosis. Left vertebral artery slightly dominant. Both PICA patent. Basilar patent to its distal aspect without stenosis. Short-segment fenestration involving distal basilar artery just prior to the apex noted. Superior cerebellar arteries patent bilaterally. Both PCA supplied via the basilar as well as small bilateral posterior communicating arteries. Right PCA widely patent to its distal aspect. Focal severe distal left P2 stenosis noted (series 1033, image 4). Left PCA otherwise patent as well. Anatomic variants: None significant. No intracranial aneurysm. No other vascular malformation seen underlying the right thalamic hemorrhage. IMPRESSION: MRI HEAD IMPRESSION: 1. Unchanged size and morphology of acute  intraparenchymal hemorrhage centered at the right thalamus. Surrounding edema and regional mass effect has mildly increased as compared to prior CT from 09/25/2021. 2. Intraventricular extension with small volume intraventricular hemorrhage, similar. No hydrocephalus or trapping. 3. Underlying moderate chronic microvascular ischemic disease. MRA HEAD IMPRESSION: 1. Negative intracranial MRA for large vessel occlusion. 2. Focal severe distal left P2 stenosis. 3. Otherwise negative intracranial MRA. No other hemodynamically significant or correctable stenosis. No aneurysm or other vascular malformation seen underlying the right thalamic hemorrhage. Electronically Signed   By: Jeannine Boga M.D.   On: 09/27/2021 06:06   DG Pelvis Portable  Result Date: 09/26/2021 CLINICAL DATA:  MRI screening, evaluate for metal. EXAM: PORTABLE PELVIS 1-2 VIEWS COMPARISON:  None Available. FINDINGS: There is no evidence for metallic foreign body. Phleboliths are noted in the pelvis. No dilated bowel loops. No acute fractures. IMPRESSION: No evidence for metallic foreign body in the pelvis or lower abdomen. Electronically Signed   By: Ronney Asters M.D.   On: 09/26/2021 19:28   ECHOCARDIOGRAM COMPLETE  Result Date: 09/26/2021    ECHOCARDIOGRAM REPORT   Patient Name:  Willa Frater Date of Exam: 09/26/2021 Medical Rec #:  [id number]        Height: Accession #:    [id number]       Weight:       181.0 lb Date of Birth:  [birth year]        BSA:          1.889 m Patient Age:    65 years         BP:           134/90 mmHg Patient Gender: F                HR:           62 bpm. Exam Location:  Inpatient Procedure: 2D Echo, Cardiac Doppler and Color Doppler                               MODIFIED REPORT: This report was modified by Dietrich Pates MD on 09/26/2021 due to Report review.  Indications:     Stroke  History:         Patient has prior history of Echocardiogram examinations, most                  recent 12/03/2019. Aortic Valve  Disease, Arrythmias:Atrial                  Fibrillation; Risk Factors:Hypertension. Previous echo done at                  [facility].  Sonographer:     Rodrigo Ran RCS Referring Phys:  [id number] ERIC LINDZEN Diagnosing Phys: Dietrich Pates MD IMPRESSIONS  1. Left ventricular ejection fraction, by estimation, is 65 to 70%. The left ventricle has normal function. The left ventricle has no regional wall motion abnormalities. Left ventricular diastolic parameters were normal.  2. Right ventricular systolic function is normal. The right ventricular size is normal.  3. The mitral valve is normal in structure. Mild mitral valve regurgitation.  4. The aortic valve is normal in structure. Aortic valve regurgitation is trivial. Aortic valve sclerosis is present, with no evidence of aortic valve stenosis.  5. There is mild dilatation of the aortic root, measuring 40 mm.  6. The inferior vena cava is dilated in size with <50% respiratory variability, suggesting right atrial pressure of 15 mmHg. FINDINGS  Left Ventricle: Left ventricular ejection fraction, by estimation, is 65 to 70%. The left ventricle has normal function. The left ventricle has no regional wall motion abnormalities. The left ventricular internal cavity size was normal in size. There is  no left ventricular hypertrophy. Left ventricular diastolic parameters were normal. Right Ventricle: The right ventricular size is normal. Right vetricular wall thickness was not assessed. Right ventricular systolic function is normal. Left Atrium: Left atrial size was normal in size. Right Atrium: Right atrial size was normal in size. Pericardium: Trivial pericardial effusion is present. Mitral Valve: The mitral valve is normal in structure. Mild mitral valve regurgitation. Tricuspid Valve: The tricuspid valve is normal in structure. Tricuspid valve regurgitation is trivial. Aortic Valve: The aortic valve is normal in structure. Aortic valve regurgitation is trivial. Aortic  regurgitation PHT measures 683 msec. Aortic valve sclerosis is present, with no evidence of aortic valve stenosis. Aortic valve mean gradient measures 5.0 mmHg. Aortic valve peak gradient measures 9.4 mmHg. Aortic valve area, by VTI measures 2.81 cm. Pulmonic Valve: The pulmonic valve was normal  in structure. Pulmonic valve regurgitation is trivial. Aorta: The aortic root and ascending aorta are structurally normal, with no evidence of dilitation. There is mild dilatation of the aortic root, measuring 40 mm. Venous: The inferior vena cava is dilated in size with less than 50% respiratory variability, suggesting right atrial pressure of 15 mmHg. IAS/Shunts: No atrial level shunt detected by color flow Doppler.  LEFT VENTRICLE PLAX 2D LVIDd:         4.40 cm   Diastology LVIDs:         2.80 cm   LV e' medial:    8.73 cm/s LV PW:         0.90 cm   LV E/e' medial:  12.9 LV IVS:        1.00 cm   LV e' lateral:   11.30 cm/s LVOT diam:     2.30 cm   LV E/e' lateral: 10.0 LV SV:         99 LV SV Index:   53 LVOT Area:     4.15 cm  RIGHT VENTRICLE             IVC RV Basal diam:  3.90 cm     IVC diam: 2.20 cm RV Mid diam:    2.70 cm RV S prime:     15.30 cm/s TAPSE (M-mode): 2.1 cm LEFT ATRIUM             Index        RIGHT ATRIUM           Index LA diam:        3.20 cm 1.69 cm/m   RA Area:     15.40 cm LA Vol (A2C):   54.1 ml 28.64 ml/m  RA Volume:   36.30 ml  19.21 ml/m LA Vol (A4C):   43.2 ml 22.87 ml/m LA Biplane Vol: 50.5 ml 26.73 ml/m  AORTIC VALVE                     PULMONIC VALVE AV Area (Vmax):    3.18 cm      PV Vmax:          0.95 m/s AV Area (Vmean):   2.86 cm      PV Peak grad:     3.6 mmHg AV Area (VTI):     2.81 cm      PR End Diast Vel: 3.65 msec AV Vmax:           153.00 cm/s AV Vmean:          110.000 cm/s AV VTI:            0.353 m AV Peak Grad:      9.4 mmHg AV Mean Grad:      5.0 mmHg LVOT Vmax:         117.00 cm/s LVOT Vmean:        75.600 cm/s LVOT VTI:          0.239 m LVOT/AV VTI ratio:  0.68 AI PHT:            683 msec  AORTA Ao Root diam: 4.00 cm Ao Asc diam:  3.00 cm MITRAL VALVE                TRICUSPID VALVE MV Area (PHT): 4.26 cm     TR Peak grad:   25.0 mmHg MV Decel Time: 178 msec     TR Vmax:  250.00 cm/s MV E velocity: 113.00 cm/s MV A velocity: 99.50 cm/s   SHUNTS MV E/A ratio:  1.14         Systemic VTI:  0.24 m                             Systemic Diam: 2.30 cm Dorris Carnes MD Electronically signed by Dorris Carnes MD Signature Date/Time: 09/26/2021/2:49:05 PM    Final (Updated)    DG Abd 1 View  Result Date: 09/25/2021 CLINICAL DATA:  OG tube placement EXAM: ABDOMEN - 1 VIEW COMPARISON:  09/25/2021 FINDINGS: Esophageal tube tip overlies the stomach, side port probably in the region of GE junction. Possible dilated segment of small bowel in the central abdomen. There is gas present in the colon IMPRESSION: Esophageal tube tip overlies the stomach, side-port in the region of GE junction, further advancement suggested for more optimal positioning Electronically Signed   By: Donavan Foil M.D.   On: 09/25/2021 23:27   CT HEAD WO CONTRAST (5MM)  Result Date: 09/25/2021 CLINICAL DATA:  Follow-up examination for intracranial hemorrhage. EXAM: CT HEAD WITHOUT CONTRAST TECHNIQUE: Contiguous axial images were obtained from the base of the skull through the vertex without intravenous contrast. RADIATION DOSE REDUCTION: This exam was performed according to the departmental dose-optimization program which includes automated exposure control, adjustment of the mA and/or kV according to patient size and/or use of iterative reconstruction technique. COMPARISON:  Prior CT from earlier the same day. FINDINGS: Brain: Previously identified acute intraparenchymal hemorrhage centered at the right thalamus again seen, not significantly changed in size measuring 2.2 x 1.5 x 2.3 cm (estimated volume 4 mL, stable when measured in a similar fashion). Mild inferior extension towards the right cerebral  peduncle. Intraventricular extension with small volume hemorrhage within the third and fourth ventricles, stable. No hydrocephalus or trapping. No other new acute intracranial hemorrhage. No acute large vessel territory infarct. Scattered hypodensity involving the supratentorial cerebral white matter noted, nonspecific, but most likely related chronic microvascular ischemic disease. No mass lesion or significant midline shift. No extra-axial fluid collection. Vascular: No hyperdense vessel. Skull: Scalp soft tissues and calvarium within normal limits. Few scattered foci of soft tissue emphysema within the left temporal region likely related IV access. Sinuses/Orbits: Globes and orbital soft tissues within normal limits. Scattered mucosal thickening noted about the ethmoidal air cells. Small retention cysts versus polyps noted within the visualized left maxillary sinus. Mastoid air cells are clear. Other: None. IMPRESSION: 1. No significant interval change in size of acute intraparenchymal hemorrhage centered at the right thalamus, estimated volume 4 mL. Small volume intraventricular extension with blood in the third and fourth ventricles. No hydrocephalus or trapping. 2. No other new acute intracranial abnormality. Electronically Signed   By: Jeannine Boga M.D.   On: 09/25/2021 21:26   DG Abdomen 1 View  Result Date: 09/25/2021 CLINICAL DATA:  Placement of NG tube EXAM: ABDOMEN - 1 VIEW COMPARISON:  None Available. FINDINGS: Distal portion of NG tube is coiled within the fundus of the stomach with its tip pointing coracoid in the region of body of the stomach. Visualized bowel gas pattern is unremarkable. There are linear densities in the lower lung fields suggesting scarring or subsegmental atelectasis. IMPRESSION: Tip of NG tube is seen in the stomach. Electronically Signed   By: Elmer Picker M.D.   On: 09/25/2021 15:50   DG Chest 1 View  Result Date: 09/25/2021 CLINICAL DATA:  Provided  history: Intubation. Status post intubation and OG tube placement. EXAM: CHEST  1 VIEW COMPARISON:  Concurrently performed abdominal radiograph 09/25/2021. FINDINGS: ET tube present with tip terminating 2.2 cm above the level of the carina. An enteric tube passes below level of left hemidiaphragm and is coiled within the stomach. Heart size within normal limits. Aortic atherosclerosis. Mild bibasilar atelectasis. No appreciable airspace consolidation. No evidence of pleural effusion or pneumothorax. No acute bony abnormality identified. IMPRESSION: Support apparatus, as described. Mild bibasilar atelectasis. Aortic Atherosclerosis (ICD10-I70.0). Electronically Signed   By: Kellie Simmering D.O.   On: 09/25/2021 15:47   CT HEAD CODE STROKE WO CONTRAST  Result Date: 09/25/2021 CLINICAL DATA:  Code stroke.  Stroke suspected, no history given EXAM: CT HEAD WITHOUT CONTRAST TECHNIQUE: Contiguous axial images were obtained from the base of the skull through the vertex without intravenous contrast. RADIATION DOSE REDUCTION: This exam was performed according to the departmental dose-optimization program which includes automated exposure control, adjustment of the mA and/or kV according to patient size and/or use of iterative reconstruction technique. COMPARISON:  None Available. FINDINGS: Brain: Hemorrhage centered in the right thalamus and basal ganglia, which measures approximally 1.3 x 2.8 x 2.2 cm (AP x TR x CC) (series 4, image 13 and series 6, image 24). Surrounding hypodensity, likely mild edema. Hemorrhage is also noted in the third ventricle, cerebral aqueduct, and fourth ventricle. Mild local mass effect 1-2 mm of right-to-left midline shift midline shift. No evidence of acute cortical infarct, hydrocephalus, or extra-axial collection. Vascular: No hyperdense vessel. Skull: No acute osseous abnormality. Sinuses/Orbits: No acute finding. Other: The mastoids are well aerated. IMPRESSION: Hemorrhage centered in the  right thalamus and basal ganglia, with mild surrounding edema, minimal midline shift, and a small amount of intraventricular extension into the third and fourth ventricles. Code stroke imaging results were communicated on 09/25/2021 at 2:53 pm to provider BHAGAT via secure text paging. Electronically Signed   By: Merilyn Baba M.D.   On: 09/25/2021 14:56     PHYSICAL EXAM  Temp:  [98.1 F (36.7 C)-99.3 F (37.4 C)] 99 F (37.2 C) (06/17 0400) Pulse Rate:  [55-91] 77 (06/17 0700) Resp:  [11-31] 21 (06/17 0700) BP: (122-162)/(54-81) 153/81 (06/17 0700) SpO2:  [89 %-97 %] 91 % (06/17 0700)  General - Well nourished, well developed, in NAD on RA  Ophthalmologic - fundi not visualized due to noncooperation.  Cardiovascular - Regular rate and rhythm, not in A-fib.  Neuro - Patient laying in bed awake and alert. Oriented to person, place and time. Hypophonic voice but improved since yesterday. Right gaze preference. Left facial droop. Left arm flaccid, no sensation to noxious stimuli on left arm. Decreased sensation on left side. Left leg 1/5. Right arm and leg 5/5. coordination intact on the right FTN and gait not tested.   ASSESSMENT/PLAN Ms. Prissy Dennin is a 66 y.o. female with history of hypertension, PAF on Eliquis admitted for left-sided weakness. No tPA given due to [place].    ICH:  right thalamic ICH with small IVH, likely secondary to hypertension in the setting of Eliquis use CT head ICH centered in the right thalamus in the basal ganglia, minimal midline shift, small amount of IVH CT repeat stable ICH and IVH MRI stable hematoma and IVH, mildly increase mass effect, no CAA MRA no AVM or aneurysm. Focal severe distal left P2 stenosis Carotid Doppler- Right Carotid: The extracranial vessels were near-normal with only minimal  wall thickening or plaque.  Left Carotid: The  extracranial vessels were near-normal with only minimal Wall thickening or plaque.  Vertebrals:  Bilateral  vertebral arteries demonstrate antegrade flow.  Subclavians: Bilateral subclavian artery flow was disturbed 2D Echo EF 65 to 70% LDL 134 HgbA1c 5.9 SCDs for VTE prophylaxis Eliquis (apixaban) daily prior to admission, now on No antithrombotic given ICH  Ongoing aggressive stroke risk factor management Therapy recommendations: CIR Disposition: Pending  Respiratory failure, resolved  Extubated 6/15  Currently on RA   PAF On Eliquis Was switched to Xarelto but not started yet Reversed with Andexxa Rate controlled currently Not on antithrombotics now given ICH Discussed with daughter about ASPIRE trial, she is considering  Hypertension Home meds losartan, metoprolol XR 50 Stable Off Cleviprex now On home losartan 50, metoprolol 25 twice daily BP goal less than 160 Long term BP goal normotensive  Hyperlipidemia Home meds: None LDL 134, goal < 70 Will consider statin on discharge  Dysphagia Passed swallow today, on nectar thick liquid Resumed home BP meds Speech on board  Other Stroke Risk Factors Advanced age   Hospital day # [place] DNP, [facility]NPC-AG   I have personally obtained history,examined this patient, reviewed notes, independently viewed imaging studies, participated in medical decision making and plan of care.ROS completed by me personally and pertinent positives fully documented  I have made any additions or clarifications directly to the above note. Agree with note above.  Patient with A-fib presented with intracerebral hemorrhage on Eliquis and required reversal.  She remains at recurrent risk for strokes and ER and unfortunately with no definite data suggesting switching to alternative anticoagulant is safe for her.  Patient may consider possible participation in the ASPIRE trial(aspirin versus Eliquis after intracerebral hemorrhage in patients with A-fib) .  She is willing to think about it we will wait at least few weeks before deciding.  Continue  ongoing therapies.  Mobilize out of bed.  Transfer to neurology floor bed.  Greater than 50% time during this 50-minute visit was spent in counseling and coordination of care and discussion with patient and care team.  Border of A-fib and intracerebral hemorrhage and discussion about risk benefit of resuming anticoagulation.  Antony Contras, MD Medical Director [facility] Pager: [phone] 09/29/2021 1:49 PM   To contact Stroke Continuity provider, please refer to [email or web address] After hours, contact General Neurology

## 2021-09-29 NOTE — Progress Notes (Addendum)
Inpatient Rehab Admissions:  Inpatient Rehab Consult received.  I met with patient at the bedside for rehabilitation assessment and to discuss goals and expectations of an inpatient rehab admission.  Pt acknowledged understanding of CIR goals and expectations. Pt interested in pursuing CIR. Pt gave permission to contact daughter Tara to confirm dispo. Called Tara and received no answer. Left a message; awaiting return call. Will continue to follow.  ADDENDUM 1329: Pt's daughter Tara returned call. She acknowledged understanding of CIR goals and expectations. She is supportive of pt pursuing CIR. She confirmed that she will be moving in with pt in order to be able to provide support for pt after discharge.    Signed: Lauren Graves Madden, MS, CCC-SLP Admissions Coordinator [phone]   

## 2021-09-29 NOTE — Evaluation (Signed)
Occupational Therapy Evaluation Patient Details Name: Charlotte Hunter MRN: [id number] DOB: 01/15/1956 Today's Date: 09/29/2021   History of Present Illness 66 y.o. female presents to [facility] hospital on 09/25/21 with L sided weakness. 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  Pt required intubation 6/13-6/15. PMH includes HTN, PAF, aortic insufficiency.   Clinical Impression   Charlotte Hunter was evaluated s/p the above admission list, she is indep at baseline. She lives alone, however he daughter plans to stay with her at d/c. Upon evaluation she had functional deficits in mobility and ADLs due to L hemiplegia, L inattention, impaired vision, impaired cognition, decreased activity tolerance, L lateral leaning and general weakness. Overall she required max A +2 for transfers and mobility, min-mod A for UB ADLs and max A +2 for LB ADLs. She will benefit from OT acutely. Recommend d/c to AIR for maximal functional recovery prior to d/c home     Recommendations for follow up therapy are one component of a multi-disciplinary discharge planning process, led by the attending physician.  Recommendations may be updated based on patient status, additional functional criteria and insurance authorization.   Follow Up Recommendations  Acute inpatient rehab (3hours/day)    Assistance Recommended at Discharge Frequent or constant Supervision/Assistance  Patient can return home with the following A lot of help with walking and/or transfers;A lot of help with bathing/dressing/bathroom;Assistance with cooking/housework;Direct supervision/assist for medications management;Direct supervision/assist for financial management;Assist for transportation;Help with stairs or ramp for entrance    Functional Status Assessment  Patient has had a recent decline in their functional status and demonstrates the ability to make significant improvements in function in a reasonable and predictable amount of time.  Equipment  Recommendations  Other (comment) (defer to next venue)    Recommendations for Other Services Rehab consult     Precautions / Restrictions Precautions Precautions: Fall Precaution Comments: L hemiplegia, L inattention Restrictions Weight Bearing Restrictions: No      Mobility Bed Mobility Overal bed mobility: Needs Assistance Bed Mobility: Supine to Sit     Supine to sit: Max assist, HOB elevated, +2 for physical assistance     General bed mobility comments: Max assist to support trunk and progress left leg and hips to EOB.  Cues to assist her left arm (not leave it behind) by grabbing the wrist.  initially left push, corrected with cues and change in R hand positioning.  Assist needed to scoot at L hip and cues for safety not to continue to scoot right hip off of the edge of the bed.    Transfers Overall transfer level: Needs assistance Equipment used: 2 person hand held assist Transfers: Sit to/from Stand Sit to Stand: Max assist, +2 physical assistance, From elevated surface Stand pivot transfers: Max assist, +2 physical assistance, From elevated surface         General transfer comment: Two person max assist to stand from elevated bed and elevated BSC to pivot to pt's right to [facility] and recliner chair.  Cues for right weight shift, significant blocking of left knee and manual movement of L foot to pivot around.  Pt did nto demonstrate any active movement in left leg with mobility.      Balance Overall balance assessment: Needs assistance Sitting-balance support: Feet supported, Single extremity supported Sitting balance-Leahy Scale: Poor Sitting balance - Comments: Up to max assist sitting EOB depending on pt's attention to not leaning left and R UE hand placement. Postural control: Left lateral lean Standing balance support: Single extremity supported Standing  balance-Leahy Scale: Poor Standing balance comment: maxA, left lean                            ADL either performed or assessed with clinical judgement   ADL Overall ADL's : Needs assistance/impaired Eating/Feeding: Supervision/ safety;Sitting Eating/Feeding Details (indicate cue type and reason): nectar thick liquids Grooming: Minimal assistance;Sitting   Upper Body Bathing: Moderate assistance;Sitting   Lower Body Bathing: Maximal assistance;+2 for physical assistance;+2 for safety/equipment;Sit to/from stand   Upper Body Dressing : Moderate assistance;Sitting   Lower Body Dressing: Maximal assistance;+2 for physical assistance;+2 for safety/equipment;Sit to/from stand   Toilet Transfer: Maximal assistance;+2 for physical assistance;+2 for safety/equipment;BSC/3in1;Stand-pivot Toilet Transfer Details (indicate cue type and reason): bed>BSC this session, cues required Toileting- Clothing Manipulation and Hygiene: Maximal assistance;+2 for physical assistance;+2 for safety/equipment;Sit to/from stand       Functional mobility during ADLs: Maximal assistance;+2 for physical assistance;+2 for safety/equipment;Cueing for sequencing General ADL Comments: limited by L hemi, L lateral lean, decreased activity tolerance, imapired cognition     Vision Baseline Vision/History: 1 Wears glasses Ability to See in Adequate Light: 0 Adequate Patient Visual Report: Diplopia;Blurring of vision Vision Assessment?: Vision impaired- to be further tested in functional context Additional Comments: reports DV in L field and blurry vision. Pt too distracable this session to complete thorough vision assessment            Pertinent Vitals/Pain Pain Assessment Pain Assessment: Faces Faces Pain Scale: Hurts even more Pain Location: right side burning pain Pain Descriptors / Indicators: Grimacing Pain Intervention(s): Limited activity within patient's tolerance, Monitored during session     Hand Dominance Right   Extremity/Trunk Assessment Upper Extremity Assessment Upper Extremity  Assessment: LUE deficits/detail LUE Deficits / Details: PROM WFL, no trace activation noted. LUE: Subluxation noted LUE Sensation: decreased light touch;decreased proprioception LUE Coordination: decreased fine motor;decreased gross motor   Lower Extremity Assessment Lower Extremity Assessment: Defer to PT evaluation   Cervical / Trunk Assessment Cervical / Trunk Assessment: Kyphotic   Communication Communication Communication: No difficulties   Cognition Arousal/Alertness: Awake/alert Behavior During Therapy: WFL for tasks assessed/performed Overall Cognitive Status: Impaired/Different from baseline Area of Impairment: Attention, Following commands, Safety/judgement, Awareness, Problem solving                   Current Attention Level: Sustained Memory: Decreased recall of precautions Following Commands: Follows one step commands consistently, Follows multi-step commands inconsistently Safety/Judgement: Decreased awareness of safety, Decreased awareness of deficits Awareness: Emergent Problem Solving: Difficulty sequencing, Requires verbal cues, Requires tactile cues General Comments: Pt responds well to basic cues, however, is distractable and needs redirection frequently throughout session with PT/OT and daughter in room.  Pt needs cues to attend to left side, but does when cued, asked daughter to sit on her left when able.     General Comments  VSS on RA    Exercises     Shoulder Instructions      Home Living Family/patient expects to be discharged to:: Private residence Living Arrangements: Alone Available Help at Discharge: Family;Available 24 hours/day Type of Home: House Home Access: Stairs to enter [employer] of Steps: 4 Entrance Stairs-Rails: Can reach both Home Layout: One level     Bathroom Shower/Tub: Producer, television/film/video: Handicapped height Bathroom Accessibility: Yes How Accessible: Accessible via walker Home  Equipment: None   Additional Comments: daughter is planning to stay with pt  Lives With: Alone  Prior Functioning/Environment Prior Level of Function : Independent/Modified Independent;Driving             Mobility Comments: enjoys gardening          OT Problem List: Decreased strength;Decreased range of motion;Decreased activity tolerance;Impaired balance (sitting and/or standing);Decreased coordination;Decreased safety awareness;Decreased cognition;Decreased knowledge of use of DME or AE;Decreased knowledge of precautions;Impaired UE functional use;Pain      OT Treatment/Interventions: Therapeutic exercise;Self-care/ADL training;DME and/or AE instruction;Neuromuscular education;Therapeutic activities;Patient/family education;Balance training    OT Goals(Current goals can be found in the care plan section) Acute Rehab OT Goals Patient Stated Goal: back to indep OT Goal Formulation: With patient Time For Goal Achievement: 10/13/21 Potential to Achieve Goals: Good ADL Goals Pt Will Perform Grooming: with set-up;sitting Pt Will Perform Upper Body Dressing: with supervision;sitting Pt Will Perform Lower Body Dressing: with min assist;sit to/from stand Pt Will Transfer to Toilet: with min assist;stand pivot transfer Pt/caregiver will Perform Home Exercise Program: Increased ROM;Left upper extremity;With written HEP provided Additional ADL Goal #1: Pt will complete bed mobility with supervision A as a precursor to ADLs  OT Frequency: Min 2X/week    Co-evaluation PT/OT/SLP Co-Evaluation/Treatment: Yes Reason for Co-Treatment: Complexity of the patient's impairments (multi-system involvement);For patient/therapist safety;To address functional/ADL transfers PT goals addressed during session: Mobility/safety with mobility;Balance;Strengthening/ROM OT goals addressed during session: ADL's and self-care      AM-PAC OT "6 Clicks" Daily Activity     Outcome Measure Help from  another person eating meals?: A Little Help from another person taking care of personal grooming?: A Little Help from another person toileting, which includes using toliet, bedpan, or urinal?: A Lot Help from another person bathing (including washing, rinsing, drying)?: A Lot Help from another person to put on and taking off regular upper body clothing?: A Lot Help from another person to put on and taking off regular lower body clothing?: A Lot 6 Click Score: 14   End of Session Nurse Communication: Mobility status  Activity Tolerance: Patient tolerated treatment well Patient left: in chair;with call bell/phone within reach;with chair alarm set;with family/visitor present  OT Visit Diagnosis: Unsteadiness on feet (R26.81);Other abnormalities of gait and mobility (R26.89);Muscle weakness (generalized) (M62.81);Pain;Hemiplegia and hemiparesis Hemiplegia - Right/Left: Left Hemiplegia - dominant/non-dominant: Non-Dominant Hemiplegia - 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

## 2021-09-30 DIAGNOSIS — E7800 Pure hypercholesterolemia, unspecified: Secondary | ICD-10-CM

## 2021-09-30 DIAGNOSIS — I619 Nontraumatic intracerebral hemorrhage, unspecified: Secondary | ICD-10-CM | POA: Diagnosis not present

## 2021-09-30 DIAGNOSIS — I480 Paroxysmal atrial fibrillation: Secondary | ICD-10-CM | POA: Diagnosis not present

## 2021-09-30 DIAGNOSIS — H538 Other visual disturbances: Secondary | ICD-10-CM

## 2021-09-30 LAB — CBC
HCT: 34.2 % — ABNORMAL LOW (ref 36.0–46.0)
Hemoglobin: 11.2 g/dL — ABNORMAL LOW (ref 12.0–15.0)
MCH: 30.1 pg (ref 26.0–34.0)
MCHC: 32.7 g/dL (ref 30.0–36.0)
MCV: 91.9 fL (ref 80.0–100.0)
Platelets: 238 10*3/uL (ref 150–400)
RBC: 3.72 MIL/uL — ABNORMAL LOW (ref 3.87–5.11)
RDW: 14.5 % (ref 11.5–15.5)
WBC: 9.7 10*3/uL (ref 4.0–10.5)
nRBC: 0 % (ref 0.0–0.2)

## 2021-09-30 LAB — BLOOD GAS, ARTERIAL
Acid-base deficit: 0.8 mmol/L (ref 0.0–2.0)
Bicarbonate: 24.3 mmol/L (ref 20.0–28.0)
FIO2: 50 %
MECHVT: 450 mL
Mechanical Rate: 18
O2 Saturation: 98.6 %
PEEP: 5 cmH2O
Patient temperature: 37
pCO2 arterial: 41 mmHg (ref 32–48)
pH, Arterial: 7.38 (ref 7.35–7.45)
pO2, Arterial: 91 mmHg (ref 83–108)

## 2021-09-30 LAB — BASIC METABOLIC PANEL
Anion gap: 7 (ref 5–15)
BUN: 9 mg/dL (ref 8–23)
CO2: 24 mmol/L (ref 22–32)
Calcium: 9 mg/dL (ref 8.9–10.3)
Chloride: 107 mmol/L (ref 98–111)
Creatinine, Ser: 0.61 mg/dL (ref 0.44–1.00)
GFR, Estimated: >60 mL/min (ref >60)
Glucose, Bld: 128 mg/dL — ABNORMAL HIGH (ref 70–99)
Potassium: 3.7 mmol/L (ref 3.5–5.1)
Sodium: 138 mmol/L (ref 135–145)

## 2021-09-30 LAB — PHOSPHORUS: Phosphorus: 3.2 mg/dL (ref 2.5–4.6)

## 2021-09-30 LAB — MAGNESIUM: Magnesium: 2.2 mg/dL (ref 1.7–2.4)

## 2021-09-30 MED ORDER — ROSUVASTATIN CALCIUM 20 MG PO TABS
20.0000 mg | ORAL_TABLET | Freq: Every day | ORAL | Status: DC
  Administered 2021-09-30 – 2021-10-01 (×2): 20 mg via ORAL
  Filled 2021-09-30 (×2): qty 1

## 2021-09-30 NOTE — Progress Notes (Signed)
SLP Cancellation Note  Patient Details Name: Charlotte Hunter MRN: [id number] DOB: 1956-03-09   Cancelled treatment:       Reason Eval/Treat Not Completed: Other (comment) (Order received for repeat MBS as requested by family. SLP will f/u next date (Monday) as that would be the earliest we could schedule MBS.)  Angela Nevin, MA, CCC-SLP Speech Therapy

## 2021-09-30 NOTE — Progress Notes (Signed)
[facility]                                                                               Charlotte Hunter, is a 66 y.o. female, DOB - 1955/11/29, DY:[id number] Admit date - 09/25/2021    Outpatient Primary MD for the patient is [name], [name], MD  LOS - 5  days    Brief summary   66 year old F with PMH of PAF on Eliquis, HTN, HLD, aortic insufficiency, GERD and frequent UTI presented to [facility] on 6/13 as a code stroke with left-sided weakness, headache and nausea, and found to have 4 mL acute intraparenchymal hemorrhage centered at the right thalamus with 1 to 2 mm right-to-left midline shift but no hydrocephalus.  Patient was intubated in ED, Andexxa administered to reverse anticoagulation.  Started on Cleviprex gtt and hypertonic saline 3%, and transferred to [facility] neuro ICU.  MRI brain on 6/15 with unchanged size and morphology of IPH, surrounding edema and mildly increased regional mass effect, intraventricular extension with a small volume intraventricular hemorrhage without hydrocephalus or trapping.  MRA head with focal severe distal left P2 stenosis.  Carotid US and TTE without significant finding.     Patient was extubated on 6/15, and transferred [facility] on 6/17.  SLP recommended dysphagia 3 diet.  Therapy recommended CIR.    Patient is medically optimized for transfer to CIR once bed available.   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  - intubated for airway protection from 6/13-6/15.  - Neurology following.  -restart eliquis as per neurology. - therapy recommended CIR.    Dysphagia:  Secondary to stroke.  Dysphagia 3 diet. Per SLP.    Paroxysmal Atrial Fib:  S/p Ablation .  Sinus rhythm.    Focal severe distal left P2 stenosis contributing to blurry vision:    Blurry vision:  Recommend outpatient follow up  with ophthalmology.    Hypertension:  Optimal BP parameters .  Continue with losartan and bb , prn hydralazine.    Constipation:  - continue with senokot BID.  - With prn miralax.    Hypokalemia/ hypophosphatemia Resolved.    Gerd PPI.   Hyperlipidemia:  LDL is 134, ? Statin on discharge.    Malnutrition Type:  Nutrition Problem: Increased nutrient needs Etiology: acute illness   Malnutrition Characteristics:  Signs/Symptoms: estimated needs   Nutrition Interventions:  Interventions: MVI, Hormel Shake  Estimated body mass index is 29.21 kg/m as calculated from the following:   Height as of this encounter: 5\' 6"  (1.676 m).   Weight as of this encounter: 82.1 kg.  Code Status: FULL CODE.  DVT Prophylaxis:  SCD's Start: 09/25/21 2319   Level of Care: Level of care: Telemetry Medical Family Communication: none at bedside.   Disposition Plan:     Remains inpatient appropriate:  stable for discharge to CIR  Procedures:  CT head ICH centered in the right thalamus in the basal ganglia, minimal midline shift, small amount of IVH CT repeat stable ICH and IVH MRI stable hematoma and  IVH, mildly increase mass effect, no CAA MRA no AVM or aneurysm. Focal severe distal left P2 stenosis Carotid Doppler Echocardiogram.    Consultants:   Neurology.   Antimicrobials:   Anti-infectives (From admission, onward)    Start     Dose/Rate Route Frequency Ordered Stop   09/29/21 1845  nitrofurantoin (MACRODANTIN) capsule 50 mg        50 mg Oral Daily 09/29/21 1753          Medications  Scheduled Meds:  Chlorhexidine Gluconate Cloth  6 each Topical Daily   losartan  50 mg Oral Daily   melatonin  3 mg Oral QHS   metoprolol tartrate  25 mg Oral BID   multivitamin with minerals  1 tablet Oral Daily   nitrofurantoin  50 mg Oral Daily   mouth rinse  15 mL Mouth Rinse 4 times per day   pantoprazole  40 mg Oral QHS   polyethylene glycol  17 g Oral BID    senna-docusate  1 tablet Oral BID   Continuous Infusions:  sodium chloride Stopped (09/28/21 0933)   PRN Meds:.acetaminophen **OR** acetaminophen (TYLENOL) oral liquid 160 mg/5 mL **OR** acetaminophen, hydrALAZINE, Muscle Rub, ondansetron (ZOFRAN) IV, mouth rinse, polyvinyl alcohol    Subjective:   [name] was seen and examined today.  Wants to work with PT.   Objective:   Vitals:   09/30/21 0600 09/30/21 0700 09/30/21 0800 09/30/21 0900  BP: 109/79 113/66 112/60 (!) 143/71  Pulse: 60 61 61 86  Resp: 18 20 20  (!) 21  Temp:      TempSrc:      SpO2: 92% (!) 89% 93% 92%  Weight:      Height:        Intake/Output Summary (Last 24 hours) at 09/30/2021 1034 Last data filed at 09/30/2021 0800 Gross per 24 hour  Intake 120 ml  Output 2950 ml  Net -2830 ml   Filed Weights   09/25/21 2340  Weight: 82.1 kg     Exam General exam: Appears calm and comfortable  Respiratory system: Clear to auscultation. Respiratory effort normal. Cardiovascular system: S1 & S2 heard, RRR. No JVD, No pedal edema. Gastrointestinal system: Abdomen is nondistended, soft and nontender. . Normal bowel sounds heard. Central nervous system: Alert and oriented. No focal neurological deficits. Extremities: Symmetric 5 x 5 power. Skin: No rashes, lesions or ulcers Psychiatry:  Mood & affect appropriate.    Data Reviewed:  I have personally reviewed following labs and imaging studies   CBC Lab Results  Component Value Date   WBC 9.7 09/30/2021   RBC 3.72 (L) 09/30/2021   HGB 11.2 (L) 09/30/2021   HCT 34.2 (L) 09/30/2021   MCV 91.9 09/30/2021   MCH 30.1 09/30/2021   PLT 238 09/30/2021   MCHC 32.7 09/30/2021   RDW 14.5 09/30/2021   LYMPHSABS 3.4 09/25/2021   MONOABS 0.5 09/25/2021   EOSABS 0.2 09/25/2021   BASOSABS 0.1 09/25/2021     Last metabolic panel Lab Results  Component Value Date   NA 138 09/30/2021   K 3.7 09/30/2021   CL 107 09/30/2021   CO2 24 09/30/2021   BUN 9  09/30/2021   CREATININE 0.61 09/30/2021   GLUCOSE 128 (H) 09/30/2021   GFRNONAA >60 09/30/2021   CALCIUM 9.0 09/30/2021   PHOS 3.2 09/30/2021   PROT 7.7 09/25/2021   ALBUMIN 4.3 09/25/2021   BILITOT 0.7 09/25/2021   ALKPHOS 78 09/25/2021   AST 20 09/25/2021   ALT  16 09/25/2021   ANIONGAP 7 09/30/2021    CBG (last 3)  No results for input(s): "GLUCAP" in the last 72 hours.    Coagulation Profile: Recent Labs  Lab 09/25/21 1442  INR 1.2     Radiology Studies: DG Swallowing Func-Speech Pathology  Result Date: 09/28/2021 Table formatting from the original result was not included. Objective Swallowing Evaluation: Type of Study: MBS-Modified Barium Swallow Study  Patient Details Name: Delrose Rohwer MRN: [id number] Date of Birth: 02/09/1956 Today's Date: 09/28/2021 Time: SLP Start Time (ACUTE ONLY): 1210 -SLP Stop Time (ACUTE ONLY): 1233 SLP Time Calculation (min) (ACUTE ONLY): 23 min Past Medical History: Past Medical History: Diagnosis Date  A-fib [facility])   Aortic insufficiency   Hypertension  Past Surgical History: The histories are not reviewed yet. Please review them in the "History" navigator section and refresh this SmartLink. HPI: 66 y.o. female presents to [facility] hospital on 09/25/2021 with L weakness. 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  Pt required intubation 6/13-6/15. PMH includes HTN, PAF, GERD, UTI.  Subjective: denies trouble swallowing before  Recommendations for follow up therapy are one component of a multi-disciplinary discharge planning process, led by the attending physician.  Recommendations may be updated based on patient status, additional functional criteria and insurance authorization. Assessment / Plan / Recommendation   09/28/2021   1:00 PM Clinical Impressions Clinical Impression Pt has a mild oropharyngeal dysphagia. Her oral phase and mastication are slow with solid consistencies, and she has a tendency to hold liquids in her mouth before swallowing.  Although this takes time, she does not necessarily need to be prompted to swallow. She has mildly reduced airway closure and if her timing is slightly off, thin liquids will enter her airway. This is primarily penetration but at times it rests on the vocal cords, and there is aspiration (PAS 7) on consecutive sips. A chin tuck does not improve airway protection, but thicker liquids do. Recommend starting with Dys 3 diet and nectar thick liquids.  SLP Visit Diagnosis Dysphagia, oropharyngeal phase (R13.12) Impact on safety and function Mild aspiration risk;Moderate aspiration risk     09/28/2021   1:00 PM Treatment Recommendations Treatment Recommendations Therapy as outlined in treatment plan below     09/28/2021   1:00 PM Prognosis Prognosis for Safe Diet Advancement Good   09/28/2021   1:00 PM Diet Recommendations SLP Diet Recommendations Dysphagia 3 (Mech soft) solids;Nectar thick liquid Liquid Administration via Cup;Straw Medication Administration Whole meds with puree Compensations Minimize environmental distractions;Slow rate;Small sips/bites Postural Changes Seated upright at 90 degrees;Remain semi-upright after after feeds/meals (Comment)     09/28/2021   1:00 PM Other Recommendations Oral Care Recommendations Oral care BID Follow Up Recommendations Acute inpatient rehab (3hours/day) Assistance recommended at discharge Frequent or constant Supervision/Assistance Functional Status Assessment Patient has had a recent decline in their functional status and demonstrates the ability to make significant improvements in function in a reasonable and predictable amount of time.   09/28/2021   1:00 PM Frequency and Duration  Speech Therapy Frequency (ACUTE ONLY) min 2x/week Treatment Duration 2 weeks     09/28/2021   1:00 PM Oral Phase Oral Phase Impaired Oral - Nectar Cup Holding of bolus Oral - Nectar Straw Holding of bolus Oral - Thin Cup Holding of bolus Oral - Thin Straw Holding of bolus Oral - Puree Delayed oral  transit Oral - Regular Delayed oral transit;Impaired mastication Oral - Pill Delayed oral transit    09/28/2021   1:00 PM Pharyngeal Phase  Pharyngeal Phase Impaired Pharyngeal- Nectar Cup WFL Pharyngeal- Nectar Straw WFL Pharyngeal- Thin Cup Reduced airway/laryngeal closure;Penetration/Aspiration during swallow Pharyngeal Material enters airway, CONTACTS cords and not ejected out Pharyngeal- Thin Straw Penetration/Aspiration before swallow;Penetration/Aspiration during swallow;Compensatory strategies attempted (with notebox) Pharyngeal Material enters airway, passes BELOW cords and not ejected out despite cough attempt by patient Pharyngeal- Puree WFL Pharyngeal- Regular WFL Pharyngeal- Pill [facility]    09/28/2021   1:00 PM Cervical Esophageal Phase  Cervical Esophageal Phase [facility] Osie Bond., M.A. [place] Office [phone] Secure chat preferred 09/28/2021, 2:19 PM                         Hosie Poisson M.D. [facility] 09/30/2021, 10:34 AM  Available via Epic secure chat 7am-7pm After 7 pm, please refer to night coverage provider listed on amion.

## 2021-09-30 NOTE — Progress Notes (Addendum)
STROKE TEAM PROGRESS NOTE   SUBJECTIVE (INTERVAL HISTORY) Patient sitting up in bed eating breakfast in NAD. She is alert and oriented. She is tracking, EOMI, Visual fields are full, following commands. Still with left facial droop, left arm flaccid, left leg flickers.  Still complains of some fuzzy, blurry vision. States she has some sensation coming back on left arm, like a burning sensation. Vital signs are stable.  No new neurological events overnight. Still waiting on a floor beds and ultimately getting to CIR.   OBJECTIVE Temp:  [98.1 F (36.7 C)-98.8 F (37.1 C)] 98.3 F (36.8 C) (06/18 0400) Pulse Rate:  [60-94] 60 (06/18 0600) Resp:  [0-27] 18 (06/18 0600) BP: (83-165)/(37-100) 109/79 (06/18 0600) SpO2:  [90 %-98 %] 92 % (06/18 0600)  Recent Labs  Lab 09/25/21 1439  GLUCAP 108*    Recent Labs  Lab 09/26/21 1001 09/27/21 0534 09/28/21 0354 09/29/21 0250 09/30/21 0210  NA 145 145 141 140 138  K 3.9 3.2* 3.3* 3.5 3.7  CL 117* 114* 111 108 107  CO2 21* 23 24 24 24   GLUCOSE 111* 100* 89 112* 128*  BUN 13 14 13 12 9   CREATININE 0.64 0.59 0.69 0.60 0.61  CALCIUM 8.4* 8.8* 8.6* 8.8* 9.0  MG  --   --  2.2 2.2 2.2  PHOS  --   --  1.9* 2.7 3.2    Recent Labs  Lab 09/25/21 1442  AST 20  ALT 16  ALKPHOS 78  BILITOT 0.7  PROT 7.7  ALBUMIN 4.3    Recent Labs  Lab 09/25/21 1442 09/25/21 2321 09/27/21 0534 09/28/21 0354 09/29/21 0250 09/30/21 0210  WBC 8.8  --  8.7 9.2 7.6 9.7  NEUTROABS 4.7  --   --   --   --   --   HGB 12.0 9.9* 10.3* 10.3* 10.9* 11.2*  HCT 37.4 29.0* 32.1* 32.2* 33.9* 34.2*  MCV 91.4  --  94.1 93.9 93.1 91.9  PLT 273  --  190 197 212 238    No results for input(s): "CKTOTAL", "CKMB", "CKMBINDEX", "TROPONINI" in the last 168 hours. No results for input(s): "LABPROT", "INR" in the last 72 hours.  No results for input(s): "COLORURINE", "LABSPEC", "PHURINE", "GLUCOSEU", "HGBUR", "BILIRUBINUR", "KETONESUR", "PROTEINUR", "UROBILINOGEN",  "NITRITE", "LEUKOCYTESUR" in the last 72 hours.  Invalid input(s): "APPERANCEUR"      Component Value Date/Time   CHOL 211 (H) 09/27/2021 0534   TRIG 180 (H) 09/27/2021 0534   HDL 41 09/27/2021 0534   CHOLHDL 5.1 09/27/2021 0534   VLDL 36 09/27/2021 0534   LDLCALC 134 (H) 09/27/2021 0534   Lab Results  Component Value Date   HGBA1C 5.9 (H) 09/25/2021   No results found for: "LABOPIA", "COCAINSCRNUR", "LABBENZ", "AMPHETMU", "THCU", "LABBARB"  No results for input(s): "ETH" in the last 168 hours.  I have personally reviewed the radiological images below and agree with the radiology interpretations.  VAS 09/29/2021 CAROTID  Result Date: 09/29/2021 Carotid Arterial Duplex Study Patient Name:  LORAE ROIG  Date of Exam:   09/27/2021 Medical Rec #: Willa Frater         Accession #:    09/29/2021 Date of Birth: 07/02/1955         Patient Gender: F Patient Age:   66 years Exam Location:  [facility] Procedure:      VAS 71 CAROTID Referring Phys: [facility] --------------------------------------------------------------------------------  Performing Technologist: [place] RDMS, RVT  Examination Guidelines: A complete evaluation includes B-mode imaging, spectral Doppler,  color Doppler, and power Doppler as needed of all accessible portions of each vessel. Bilateral testing is considered an integral part of a complete examination. Limited examinations for reoccurring indications may be performed as noted.  Right Carotid Findings: +----------+--------+--------+--------+------------------+------------------+           PSV cm/sEDV cm/sStenosisPlaque DescriptionComments           +----------+--------+--------+--------+------------------+------------------+ CCA Prox  89      13                                                   +----------+--------+--------+--------+------------------+------------------+ CCA Distal70      14                                intimal thickening  +----------+--------+--------+--------+------------------+------------------+ ICA Prox  65      16                                intimal thickening +----------+--------+--------+--------+------------------+------------------+ ICA Mid   98      26                                                   +----------+--------+--------+--------+------------------+------------------+ ICA Distal93      29                                                   +----------+--------+--------+--------+------------------+------------------+ ECA       93      7                                                    +----------+--------+--------+--------+------------------+------------------+ +----------+--------+-------+---------+-------------------+           PSV cm/sEDV cmsDescribe Arm Pressure (mmHG) +----------+--------+-------+---------+-------------------+ [id number]            Turbulent                    +----------+--------+-------+---------+-------------------+ +---------+--------+--+--------+--+---------+ VertebralPSV cm/s73EDV cm/s21Antegrade +---------+--------+--+--------+--+---------+  Left Carotid Findings: +----------+--------+--------+--------+------------------+------------------+           PSV cm/sEDV cm/sStenosisPlaque DescriptionComments           +----------+--------+--------+--------+------------------+------------------+ CCA Prox  102     20                                                   +----------+--------+--------+--------+------------------+------------------+ CCA Distal90      19                                                   +----------+--------+--------+--------+------------------+------------------+  ICA Prox  68      15                                intimal thickening +----------+--------+--------+--------+------------------+------------------+ ICA Mid   129     29                                                    +----------+--------+--------+--------+------------------+------------------+ ICA Distal133     36                                                   +----------+--------+--------+--------+------------------+------------------+ ECA       86      11                                                   +----------+--------+--------+--------+------------------+------------------+ +----------+--------+--------+---------+-------------------+           PSV cm/sEDV cm/sDescribe Arm Pressure (mmHG) +----------+--------+--------+---------+-------------------+ [id number]             Turbulent                    +----------+--------+--------+---------+-------------------+ +---------+--------+--+--------+--+---------+ VertebralPSV cm/s69EDV cm/s15Antegrade +---------+--------+--+--------+--+---------+   Summary: Right Carotid: The extracranial vessels were near-normal with only minimal wall                thickening or plaque. Left Carotid: The extracranial vessels were near-normal with only minimal wall               thickening or plaque. Vertebrals:  Bilateral vertebral arteries demonstrate antegrade flow. Subclavians: Bilateral subclavian artery flow was disturbed. *See table(s) above for measurements and observations.  Electronically signed by Antony Contras MD on 09/29/2021 at 12:34:29 PM.    Final    DG Swallowing Func-Speech Pathology  Result Date: 09/28/2021 Table formatting from the original result was not included. Objective Swallowing Evaluation: Type of Study: MBS-Modified Barium Swallow Study  Patient Details Name: Aliyaha Aulakh MRN: [id number] Date of Birth: 05-Aug-1955 Today's Date: 09/28/2021 Time: SLP Start Time (ACUTE ONLY): 1210 -SLP Stop Time (ACUTE ONLY): 1233 SLP Time Calculation (min) (ACUTE ONLY): 23 min Past Medical History: Past Medical History: Diagnosis Date  A-fib [facility])   Aortic insufficiency   Hypertension  Past Surgical History: The histories are not reviewed yet.  Please review them in the "History" navigator section and refresh this Lorane. HPI: 66 y.o. female presents to [facility] hospital on 09/25/2021 with L weakness. 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  Pt required intubation 6/13-6/15. PMH includes HTN, PAF, GERD, UTI.  Subjective: denies trouble swallowing before  Recommendations for follow up therapy are one component of a multi-disciplinary discharge planning process, led by the attending physician.  Recommendations may be updated based on patient status, additional functional criteria and insurance authorization. Assessment / Plan / Recommendation   09/28/2021   1:00 PM Clinical Impressions Clinical Impression Pt has a mild oropharyngeal dysphagia. Her oral phase and mastication are slow with solid consistencies, and she has a tendency  to hold liquids in her mouth before swallowing. Although this takes time, she does not necessarily need to be prompted to swallow. She has mildly reduced airway closure and if her timing is slightly off, thin liquids will enter her airway. This is primarily penetration but at times it rests on the vocal cords, and there is aspiration (PAS 7) on consecutive sips. A chin tuck does not improve airway protection, but thicker liquids do. Recommend starting with Dys 3 diet and nectar thick liquids.  SLP Visit Diagnosis Dysphagia, oropharyngeal phase (R13.12) Impact on safety and function Mild aspiration risk;Moderate aspiration risk     09/28/2021   1:00 PM Treatment Recommendations Treatment Recommendations Therapy as outlined in treatment plan below     09/28/2021   1:00 PM Prognosis Prognosis for Safe Diet Advancement Good   09/28/2021   1:00 PM Diet Recommendations SLP Diet Recommendations Dysphagia 3 (Mech soft) solids;Nectar thick liquid Liquid Administration via Cup;Straw Medication Administration Whole meds with puree Compensations Minimize environmental distractions;Slow rate;Small sips/bites Postural Changes Seated upright  at 90 degrees;Remain semi-upright after after feeds/meals (Comment)     09/28/2021   1:00 PM Other Recommendations Oral Care Recommendations Oral care BID Follow Up Recommendations Acute inpatient rehab (3hours/day) Assistance recommended at discharge Frequent or constant Supervision/Assistance Functional Status Assessment Patient has had a recent decline in their functional status and demonstrates the ability to make significant improvements in function in a reasonable and predictable amount of time.   09/28/2021   1:00 PM Frequency and Duration  Speech Therapy Frequency (ACUTE ONLY) min 2x/week Treatment Duration 2 weeks     09/28/2021   1:00 PM Oral Phase Oral Phase Impaired Oral - Nectar Cup Holding of bolus Oral - Nectar Straw Holding of bolus Oral - Thin Cup Holding of bolus Oral - Thin Straw Holding of bolus Oral - Puree Delayed oral transit Oral - Regular Delayed oral transit;Impaired mastication Oral - Pill Delayed oral transit    09/28/2021   1:00 PM Pharyngeal Phase Pharyngeal Phase Impaired Pharyngeal- Nectar Cup WFL Pharyngeal- Nectar Straw WFL Pharyngeal- Thin Cup Reduced airway/laryngeal closure;Penetration/Aspiration during swallow Pharyngeal Material enters airway, CONTACTS cords and not ejected out Pharyngeal- Thin Straw Penetration/Aspiration before swallow;Penetration/Aspiration during swallow;Compensatory strategies attempted (with notebox) Pharyngeal Material enters airway, passes BELOW cords and not ejected out despite cough attempt by patient Pharyngeal- Puree WFL Pharyngeal- Regular WFL Pharyngeal- Pill [facility]    09/28/2021   1:00 PM Cervical Esophageal Phase  Cervical Esophageal Phase [facility] Osie Bond., M.A. CCC-SLP Acute Rehabilitation Services Office [phone] Secure chat preferred 09/28/2021, 2:19 PM                     MR BRAIN WO CONTRAST  Result Date: 09/27/2021 CLINICAL DATA:  Follow-up examination for hemorrhagic stroke. EXAM: MRI HEAD WITHOUT CONTRAST MRA HEAD WITHOUT CONTRAST TECHNIQUE:  Multiplanar, multi-echo pulse sequences of the brain and surrounding structures were acquired without intravenous contrast. Angiographic images of the Circle of Willis were acquired using MRA technique without intravenous contrast. COMPARISON:  Prior CT from 09/25/2021. FINDINGS: MRI HEAD FINDINGS Brain: Cerebral volume within normal limits. Scattered patchy T2/FLAIR hyperintensity involving the periventricular deep white matter both cerebral hemispheres, most consistent with chronic small vessel ischemic disease, moderately advanced in nature. Previously identified acute intraparenchymal hemorrhage centered at the right thalamus again seen, not significantly changed in size or morphology as compared to prior CT. Lesion measures approximately 2.9 x 1.6 x 2.2 cm by MRI. Surrounding T2/FLAIR signal consistent with edema, mildly  increased from prior. Edema extends inferiorly into the right cerebral peduncle/midbrain. Mild localized right-to-left shift. Intraventricular extension with small volume intraventricular hemorrhage, similar as well. No hydrocephalus or trapping. No other evidence for acute or subacute ischemia. Focus of mild diffusion signal abnormality at the posterior left frontal centrum semi ovale felt to be related to susceptibility artifact (series 5, image 87). No other acute intracranial hemorrhage. Few scattered chronic micro hemorrhages noted, likely hypertensive in nature. No mass lesion or extra-axial fluid collection. Pituitary gland suprasellar region within normal limits. Vascular: Major intracranial vascular flow voids are maintained. Skull and upper cervical spine: Craniocervical junction within normal limits. Bone marrow signal intensity normal. Small lipoma at the right frontal scalp noted. Scalp soft tissues otherwise unremarkable. Sinuses/Orbits: Globes orbital soft tissues within normal limits. Mild scattered mucosal thickening noted about the ethmoidal air cells. Few small left maxillary  sinus retention cyst noted. Trace right mastoid effusion. Patient is intubated. Other: None. MRA HEAD FINDINGS Anterior circulation: Visualized distal cervical segments of the internal carotid arteries are patent with antegrade flow. Distal cervical right ICA is tortuous. Petrous, cavernous, supraclinoid segments patent without stenosis or other abnormality. A1 segments patent bilaterally. Normal anterior communicating artery complex. Anterior cerebral arteries patent without stenosis. No M1 stenosis or occlusion. No proximal MCA branch occlusion. Distal MCA branches perfused and symmetric. Posterior circulation: Both vertebral arteries patent without stenosis. Left vertebral artery slightly dominant. Both PICA patent. Basilar patent to its distal aspect without stenosis. Short-segment fenestration involving distal basilar artery just prior to the apex noted. Superior cerebellar arteries patent bilaterally. Both PCA supplied via the basilar as well as small bilateral posterior communicating arteries. Right PCA widely patent to its distal aspect. Focal severe distal left P2 stenosis noted (series 1033, image 4). Left PCA otherwise patent as well. Anatomic variants: None significant. No intracranial aneurysm. No other vascular malformation seen underlying the right thalamic hemorrhage. IMPRESSION: MRI HEAD IMPRESSION: 1. Unchanged size and morphology of acute intraparenchymal hemorrhage centered at the right thalamus. Surrounding edema and regional mass effect has mildly increased as compared to prior CT from 09/25/2021. 2. Intraventricular extension with small volume intraventricular hemorrhage, similar. No hydrocephalus or trapping. 3. Underlying moderate chronic microvascular ischemic disease. MRA HEAD IMPRESSION: 1. Negative intracranial MRA for large vessel occlusion. 2. Focal severe distal left P2 stenosis. 3. Otherwise negative intracranial MRA. No other hemodynamically significant or correctable stenosis. No  aneurysm or other vascular malformation seen underlying the right thalamic hemorrhage. Electronically Signed   By: Rise Mu M.D.   On: 09/27/2021 06:06   MR ANGIO HEAD WO CONTRAST  Result Date: 09/27/2021 CLINICAL DATA:  Follow-up examination for hemorrhagic stroke. EXAM: MRI HEAD WITHOUT CONTRAST MRA HEAD WITHOUT CONTRAST TECHNIQUE: Multiplanar, multi-echo pulse sequences of the brain and surrounding structures were acquired without intravenous contrast. Angiographic images of the Circle of Willis were acquired using MRA technique without intravenous contrast. COMPARISON:  Prior CT from 09/25/2021. FINDINGS: MRI HEAD FINDINGS Brain: Cerebral volume within normal limits. Scattered patchy T2/FLAIR hyperintensity involving the periventricular deep white matter both cerebral hemispheres, most consistent with chronic small vessel ischemic disease, moderately advanced in nature. Previously identified acute intraparenchymal hemorrhage centered at the right thalamus again seen, not significantly changed in size or morphology as compared to prior CT. Lesion measures approximately 2.9 x 1.6 x 2.2 cm by MRI. Surrounding T2/FLAIR signal consistent with edema, mildly increased from prior. Edema extends inferiorly into the right cerebral peduncle/midbrain. Mild localized right-to-left shift. Intraventricular extension with small volume intraventricular  hemorrhage, similar as well. No hydrocephalus or trapping. No other evidence for acute or subacute ischemia. Focus of mild diffusion signal abnormality at the posterior left frontal centrum semi ovale felt to be related to susceptibility artifact (series 5, image 87). No other acute intracranial hemorrhage. Few scattered chronic micro hemorrhages noted, likely hypertensive in nature. No mass lesion or extra-axial fluid collection. Pituitary gland suprasellar region within normal limits. Vascular: Major intracranial vascular flow voids are maintained. Skull and  upper cervical spine: Craniocervical junction within normal limits. Bone marrow signal intensity normal. Small lipoma at the right frontal scalp noted. Scalp soft tissues otherwise unremarkable. Sinuses/Orbits: Globes orbital soft tissues within normal limits. Mild scattered mucosal thickening noted about the ethmoidal air cells. Few small left maxillary sinus retention cyst noted. Trace right mastoid effusion. Patient is intubated. Other: None. MRA HEAD FINDINGS Anterior circulation: Visualized distal cervical segments of the internal carotid arteries are patent with antegrade flow. Distal cervical right ICA is tortuous. Petrous, cavernous, supraclinoid segments patent without stenosis or other abnormality. A1 segments patent bilaterally. Normal anterior communicating artery complex. Anterior cerebral arteries patent without stenosis. No M1 stenosis or occlusion. No proximal MCA branch occlusion. Distal MCA branches perfused and symmetric. Posterior circulation: Both vertebral arteries patent without stenosis. Left vertebral artery slightly dominant. Both PICA patent. Basilar patent to its distal aspect without stenosis. Short-segment fenestration involving distal basilar artery just prior to the apex noted. Superior cerebellar arteries patent bilaterally. Both PCA supplied via the basilar as well as small bilateral posterior communicating arteries. Right PCA widely patent to its distal aspect. Focal severe distal left P2 stenosis noted (series 1033, image 4). Left PCA otherwise patent as well. Anatomic variants: None significant. No intracranial aneurysm. No other vascular malformation seen underlying the right thalamic hemorrhage. IMPRESSION: MRI HEAD IMPRESSION: 1. Unchanged size and morphology of acute intraparenchymal hemorrhage centered at the right thalamus. Surrounding edema and regional mass effect has mildly increased as compared to prior CT from 09/25/2021. 2. Intraventricular extension with small volume  intraventricular hemorrhage, similar. No hydrocephalus or trapping. 3. Underlying moderate chronic microvascular ischemic disease. MRA HEAD IMPRESSION: 1. Negative intracranial MRA for large vessel occlusion. 2. Focal severe distal left P2 stenosis. 3. Otherwise negative intracranial MRA. No other hemodynamically significant or correctable stenosis. No aneurysm or other vascular malformation seen underlying the right thalamic hemorrhage. Electronically Signed   By: Jeannine Boga M.D.   On: 09/27/2021 06:06   DG Pelvis Portable  Result Date: 09/26/2021 CLINICAL DATA:  MRI screening, evaluate for metal. EXAM: PORTABLE PELVIS 1-2 VIEWS COMPARISON:  None Available. FINDINGS: There is no evidence for metallic foreign body. Phleboliths are noted in the pelvis. No dilated bowel loops. No acute fractures. IMPRESSION: No evidence for metallic foreign body in the pelvis or lower abdomen. Electronically Signed   By: Ronney Asters M.D.   On: 09/26/2021 19:28   ECHOCARDIOGRAM COMPLETE  Result Date: 09/26/2021    ECHOCARDIOGRAM REPORT   Patient Name:   DORA HATHEWAY Date of Exam: 09/26/2021 Medical Rec #:  [id number]        Height: Accession #:    [id number]       Weight:       181.0 lb Date of Birth:  10-19-1955        BSA:          1.889 m Patient Age:    38 years         BP:  134/90 mmHg Patient Gender: F                HR:           62 bpm. Exam Location:  Inpatient Procedure: 2D Echo, Cardiac Doppler and Color Doppler                               MODIFIED REPORT: This report was modified by Dorris Carnes MD on 09/26/2021 due to Report review.  Indications:     Stroke  History:         Patient has prior history of Echocardiogram examinations, most                  recent 12/03/2019. Aortic Valve Disease, Arrythmias:Atrial                  Fibrillation; Risk Factors:Hypertension. Previous echo done at                  [facility].  Sonographer:     Joette Catching RCS Referring Phys:  [id number] ERIC LINDZEN  Diagnosing Phys: Dorris Carnes MD IMPRESSIONS  1. Left ventricular ejection fraction, by estimation, is 65 to 70%. The left ventricle has normal function. The left ventricle has no regional wall motion abnormalities. Left ventricular diastolic parameters were normal.  2. Right ventricular systolic function is normal. The right ventricular size is normal.  3. The mitral valve is normal in structure. Mild mitral valve regurgitation.  4. The aortic valve is normal in structure. Aortic valve regurgitation is trivial. Aortic valve sclerosis is present, with no evidence of aortic valve stenosis.  5. There is mild dilatation of the aortic root, measuring 40 mm.  6. The inferior vena cava is dilated in size with <50% respiratory variability, suggesting right atrial pressure of 15 mmHg. FINDINGS  Left Ventricle: Left ventricular ejection fraction, by estimation, is 65 to 70%. The left ventricle has normal function. The left ventricle has no regional wall motion abnormalities. The left ventricular internal cavity size was normal in size. There is  no left ventricular hypertrophy. Left ventricular diastolic parameters were normal. Right Ventricle: The right ventricular size is normal. Right vetricular wall thickness was not assessed. Right ventricular systolic function is normal. Left Atrium: Left atrial size was normal in size. Right Atrium: Right atrial size was normal in size. Pericardium: Trivial pericardial effusion is present. Mitral Valve: The mitral valve is normal in structure. Mild mitral valve regurgitation. Tricuspid Valve: The tricuspid valve is normal in structure. Tricuspid valve regurgitation is trivial. Aortic Valve: The aortic valve is normal in structure. Aortic valve regurgitation is trivial. Aortic regurgitation PHT measures 683 msec. Aortic valve sclerosis is present, with no evidence of aortic valve stenosis. Aortic valve mean gradient measures 5.0 mmHg. Aortic valve peak gradient measures 9.4 mmHg. Aortic  valve area, by VTI measures 2.81 cm. Pulmonic Valve: The pulmonic valve was normal in structure. Pulmonic valve regurgitation is trivial. Aorta: The aortic root and ascending aorta are structurally normal, with no evidence of dilitation. There is mild dilatation of the aortic root, measuring 40 mm. Venous: The inferior vena cava is dilated in size with less than 50% respiratory variability, suggesting right atrial pressure of 15 mmHg. IAS/Shunts: No atrial level shunt detected by color flow Doppler.  LEFT VENTRICLE PLAX 2D LVIDd:         4.40 cm   Diastology LVIDs:  2.80 cm   LV e' medial:    8.73 cm/s LV PW:         0.90 cm   LV E/e' medial:  12.9 LV IVS:        1.00 cm   LV e' lateral:   11.30 cm/s LVOT diam:     2.30 cm   LV E/e' lateral: 10.0 LV SV:         99 LV SV Index:   53 LVOT Area:     4.15 cm  RIGHT VENTRICLE             IVC RV Basal diam:  3.90 cm     IVC diam: 2.20 cm RV Mid diam:    2.70 cm RV S prime:     15.30 cm/s TAPSE (M-mode): 2.1 cm LEFT ATRIUM             Index        RIGHT ATRIUM           Index LA diam:        3.20 cm 1.69 cm/m   RA Area:     15.40 cm LA Vol (A2C):   54.1 ml 28.64 ml/m  RA Volume:   36.30 ml  19.21 ml/m LA Vol (A4C):   43.2 ml 22.87 ml/m LA Biplane Vol: 50.5 ml 26.73 ml/m  AORTIC VALVE                     PULMONIC VALVE AV Area (Vmax):    3.18 cm      PV Vmax:          0.95 m/s AV Area (Vmean):   2.86 cm      PV Peak grad:     3.6 mmHg AV Area (VTI):     2.81 cm      PR End Diast Vel: 3.65 msec AV Vmax:           153.00 cm/s AV Vmean:          110.000 cm/s AV VTI:            0.353 m AV Peak Grad:      9.4 mmHg AV Mean Grad:      5.0 mmHg LVOT Vmax:         117.00 cm/s LVOT Vmean:        75.600 cm/s LVOT VTI:          0.239 m LVOT/AV VTI ratio: 0.68 AI PHT:            683 msec  AORTA Ao Root diam: 4.00 cm Ao Asc diam:  3.00 cm MITRAL VALVE                TRICUSPID VALVE MV Area (PHT): 4.26 cm     TR Peak grad:   25.0 mmHg MV Decel Time: 178 msec     TR  Vmax:        250.00 cm/s MV E velocity: 113.00 cm/s MV A velocity: 99.50 cm/s   SHUNTS MV E/A ratio:  1.14         Systemic VTI:  0.24 m                             Systemic Diam: 2.30 cm Dorris Carnes MD Electronically signed by Dorris Carnes MD Signature Date/Time: 09/26/2021/2:49:05 PM    Final (Updated)    DG Abd 1 View  Result Date: 09/25/2021 CLINICAL DATA:  OG tube placement EXAM: ABDOMEN - 1 VIEW COMPARISON:  09/25/2021 FINDINGS: Esophageal tube tip overlies the stomach, side port probably in the region of GE junction. Possible dilated segment of small bowel in the central abdomen. There is gas present in the colon IMPRESSION: Esophageal tube tip overlies the stomach, side-port in the region of GE junction, further advancement suggested for more optimal positioning Electronically Signed   By: Donavan Foil M.D.   On: 09/25/2021 23:27   CT HEAD WO CONTRAST (5MM)  Result Date: 09/25/2021 CLINICAL DATA:  Follow-up examination for intracranial hemorrhage. EXAM: CT HEAD WITHOUT CONTRAST TECHNIQUE: Contiguous axial images were obtained from the base of the skull through the vertex without intravenous contrast. RADIATION DOSE REDUCTION: This exam was performed according to the departmental dose-optimization program which includes automated exposure control, adjustment of the mA and/or kV according to patient size and/or use of iterative reconstruction technique. COMPARISON:  Prior CT from earlier the same day. FINDINGS: Brain: Previously identified acute intraparenchymal hemorrhage centered at the right thalamus again seen, not significantly changed in size measuring 2.2 x 1.5 x 2.3 cm (estimated volume 4 mL, stable when measured in a similar fashion). Mild inferior extension towards the right cerebral peduncle. Intraventricular extension with small volume hemorrhage within the third and fourth ventricles, stable. No hydrocephalus or trapping. No other new acute intracranial hemorrhage. No acute large vessel  territory infarct. Scattered hypodensity involving the supratentorial cerebral white matter noted, nonspecific, but most likely related chronic microvascular ischemic disease. No mass lesion or significant midline shift. No extra-axial fluid collection. Vascular: No hyperdense vessel. Skull: Scalp soft tissues and calvarium within normal limits. Few scattered foci of soft tissue emphysema within the left temporal region likely related IV access. Sinuses/Orbits: Globes and orbital soft tissues within normal limits. Scattered mucosal thickening noted about the ethmoidal air cells. Small retention cysts versus polyps noted within the visualized left maxillary sinus. Mastoid air cells are clear. Other: None. IMPRESSION: 1. No significant interval change in size of acute intraparenchymal hemorrhage centered at the right thalamus, estimated volume 4 mL. Small volume intraventricular extension with blood in the third and fourth ventricles. No hydrocephalus or trapping. 2. No other new acute intracranial abnormality. Electronically Signed   By: Jeannine Boga M.D.   On: 09/25/2021 21:26   DG Abdomen 1 View  Result Date: 09/25/2021 CLINICAL DATA:  Placement of NG tube EXAM: ABDOMEN - 1 VIEW COMPARISON:  None Available. FINDINGS: Distal portion of NG tube is coiled within the fundus of the stomach with its tip pointing coracoid in the region of body of the stomach. Visualized bowel gas pattern is unremarkable. There are linear densities in the lower lung fields suggesting scarring or subsegmental atelectasis. IMPRESSION: Tip of NG tube is seen in the stomach. Electronically Signed   By: Elmer Picker M.D.   On: 09/25/2021 15:50   DG Chest 1 View  Result Date: 09/25/2021 CLINICAL DATA:  Provided history: Intubation. Status post intubation and OG tube placement. EXAM: CHEST  1 VIEW COMPARISON:  Concurrently performed abdominal radiograph 09/25/2021. FINDINGS: ET tube present with tip terminating 2.2 cm above  the level of the carina. An enteric tube passes below level of left hemidiaphragm and is coiled within the stomach. Heart size within normal limits. Aortic atherosclerosis. Mild bibasilar atelectasis. No appreciable airspace consolidation. No evidence of pleural effusion or pneumothorax. No acute bony abnormality identified. IMPRESSION: Support apparatus, as described. Mild bibasilar atelectasis. Aortic Atherosclerosis (ICD10-I70.0). Electronically Signed  By: Kellie Simmering D.O.   On: 09/25/2021 15:47   CT HEAD CODE STROKE WO CONTRAST  Result Date: 09/25/2021 CLINICAL DATA:  Code stroke.  Stroke suspected, no history given EXAM: CT HEAD WITHOUT CONTRAST TECHNIQUE: Contiguous axial images were obtained from the base of the skull through the vertex without intravenous contrast. RADIATION DOSE REDUCTION: This exam was performed according to the departmental dose-optimization program which includes automated exposure control, adjustment of the mA and/or kV according to patient size and/or use of iterative reconstruction technique. COMPARISON:  None Available. FINDINGS: Brain: Hemorrhage centered in the right thalamus and basal ganglia, which measures approximally 1.3 x 2.8 x 2.2 cm (AP x TR x CC) (series 4, image 13 and series 6, image 24). Surrounding hypodensity, likely mild edema. Hemorrhage is also noted in the third ventricle, cerebral aqueduct, and fourth ventricle. Mild local mass effect 1-2 mm of right-to-left midline shift midline shift. No evidence of acute cortical infarct, hydrocephalus, or extra-axial collection. Vascular: No hyperdense vessel. Skull: No acute osseous abnormality. Sinuses/Orbits: No acute finding. Other: The mastoids are well aerated. IMPRESSION: Hemorrhage centered in the right thalamus and basal ganglia, with mild surrounding edema, minimal midline shift, and a small amount of intraventricular extension into the third and fourth ventricles. Code stroke imaging results were  communicated on 09/25/2021 at 2:53 pm to provider BHAGAT via secure text paging. Electronically Signed   By: Merilyn Baba M.D.   On: 09/25/2021 14:56     PHYSICAL EXAM  Temp:  [98.1 F (36.7 C)-98.8 F (37.1 C)] 98.3 F (36.8 C) (06/18 0400) Pulse Rate:  [60-94] 60 (06/18 0600) Resp:  [0-27] 18 (06/18 0600) BP: (83-165)/(37-100) 109/79 (06/18 0600) SpO2:  [90 %-98 %] 92 % (06/18 0600)  General - Well nourished, well developed, in NAD on RA  Ophthalmologic - fundi not visualized due to noncooperation.  Cardiovascular - Regular rate and rhythm, not in A-fib.  Neuro - Patient laying in bed awake and alert. Oriented to person, place and time. Hypophonic voice but improved since yesterday. Right gaze preference. Left facial droop. Left arm flaccid, no sensation to noxious stimuli on left arm. Decreased sensation on left side. Left leg 1/5. Right arm and leg 5/5. coordination intact on the right FTN and gait not tested.   ASSESSMENT/PLAN Ms. Charlotte Hunter is a 66 y.o. female with history of hypertension, PAF on Eliquis admitted for left-sided weakness. No tPA given due to [place].    ICH:  right thalamic ICH with small IVH, likely secondary to hypertension in the setting of Eliquis use CT head ICH centered in the right thalamus in the basal ganglia, minimal midline shift, small amount of IVH CT repeat stable ICH and IVH MRI stable hematoma and IVH, mildly increase mass effect, no CAA MRA no AVM or aneurysm. Focal severe distal left P2 stenosis Carotid Doppler- Right Carotid: The extracranial vessels were near-normal with only minimal  wall thickening or plaque.  Left Carotid: The extracranial vessels were near-normal with only minimal Wall thickening or plaque.  Vertebrals:  Bilateral vertebral arteries demonstrate antegrade flow.  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  Hypertension Home meds losartan, metoprolol XR 50 Stable now Off Cleviprex now On home losartan 50, metoprolol 25 twice daily BP goal less than 160 Long term BP goal normotensive  Hyperlipidemia Home meds: None LDL 134, goal < 70 Put on crestor 20 Continue on discharge.  Dysphagia Passed swallow today, on nectar thick liquid Resumed home BP meds Speech on board  Other Stroke Risk Factors Advanced age   Hospital day # [place] DNP, ACNPC-AG   ATTENDING NOTE: I reviewed above note and agree with the assessment and plan. Pt was seen and examined.   No family at bedside.  Patient lying in bed, awake alert, orientated x3, slight hypophonia, no aphasia or dysarthria.  Still has left hemiplegia, but seems to have 1/5 left lower extremity and lost sensation on left.  Added Crestor 20 for hyperlipidemia.  Patient stated that she is not interested for ASPIRE trial.  Pending CIR.   Neurology will sign off. Please call with questions. Pt will follow up with stroke clinic NP at [facility] in about 4 weeks. Thanks for the consult.   For detailed assessment and plan, please refer to above as I have made changes wherever appropriate.   Rosalin Hawking, MD PhD Stroke Neurology 09/30/2021 2:06 PM    To contact Stroke Continuity provider, please refer to [email or web address] After hours, contact General Neurology

## 2021-09-30 NOTE — TOC Progression Note (Signed)
Transition of Care [facility]) - Progression Note    Patient Details  Name: Aparna Vanderweele MRN: [id number] Date of Birth: [birth year]  Transition of [facility]) CM/SW Contact  Bess Kinds, RN Phone Number: [phone] 09/30/2021, 8:51 AM  Clinical Narrative:     Received call from patient's daughter, [name], at [phone] wanting to discuss plan for patient to move to CIR bed. Advised that patient could be transferred to a medical/telemetry prior to transfer to CIR depending on bed availability.        Expected Discharge Plan and Services                                                 Social Determinants of Health (SDOH) Interventions    Readmission Risk Interventions     No data to display

## 2021-10-01 ENCOUNTER — Encounter (HOSPITAL_COMMUNITY): Payer: Self-pay | Admitting: Physical Medicine & Rehabilitation

## 2021-10-01 ENCOUNTER — Inpatient Hospital Stay (HOSPITAL_COMMUNITY)
Admission: RE | Admit: 2021-10-01 | Discharge: 2021-11-06 | DRG: 057 | Disposition: A | Discharge status: Home Health | Payer: Medicare Other | Source: Intra-hospital | Attending: Physical Medicine & Rehabilitation | Admitting: Physical Medicine & Rehabilitation

## 2021-10-01 ENCOUNTER — Other Ambulatory Visit: Payer: Self-pay

## 2021-10-01 DIAGNOSIS — R1310 Dysphagia, unspecified: Secondary | ICD-10-CM | POA: Diagnosis present

## 2021-10-01 DIAGNOSIS — Z7901 Long term (current) use of anticoagulants: Secondary | ICD-10-CM | POA: Diagnosis not present

## 2021-10-01 DIAGNOSIS — R5383 Other fatigue: Secondary | ICD-10-CM | POA: Diagnosis not present

## 2021-10-01 DIAGNOSIS — R32 Unspecified urinary incontinence: Secondary | ICD-10-CM | POA: Diagnosis present

## 2021-10-01 DIAGNOSIS — I480 Paroxysmal atrial fibrillation: Secondary | ICD-10-CM | POA: Diagnosis present

## 2021-10-01 DIAGNOSIS — R0789 Other chest pain: Secondary | ICD-10-CM | POA: Diagnosis not present

## 2021-10-01 DIAGNOSIS — R339 Retention of urine, unspecified: Secondary | ICD-10-CM | POA: Diagnosis not present

## 2021-10-01 DIAGNOSIS — I619 Nontraumatic intracerebral hemorrhage, unspecified: Secondary | ICD-10-CM | POA: Diagnosis present

## 2021-10-01 DIAGNOSIS — I10 Essential (primary) hypertension: Secondary | ICD-10-CM | POA: Diagnosis present

## 2021-10-01 DIAGNOSIS — Z792 Long term (current) use of antibiotics: Secondary | ICD-10-CM | POA: Diagnosis not present

## 2021-10-01 DIAGNOSIS — M79605 Pain in left leg: Secondary | ICD-10-CM | POA: Diagnosis not present

## 2021-10-01 DIAGNOSIS — I69391 Dysphagia following cerebral infarction: Secondary | ICD-10-CM | POA: Diagnosis not present

## 2021-10-01 DIAGNOSIS — H538 Other visual disturbances: Secondary | ICD-10-CM | POA: Diagnosis present

## 2021-10-01 DIAGNOSIS — Z885 Allergy status to narcotic agent status: Secondary | ICD-10-CM | POA: Diagnosis not present

## 2021-10-01 DIAGNOSIS — I69154 Hemiplegia and 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 left non-dominant side: Principal | ICD-10-CM

## 2021-10-01 DIAGNOSIS — I69198 Other sequelae of nontraumatic intracerebral hemorrhage: Secondary | ICD-10-CM

## 2021-10-01 DIAGNOSIS — Z91041 Radiographic dye allergy status: Secondary | ICD-10-CM

## 2021-10-01 DIAGNOSIS — Z87440 Personal history of urinary (tract) infections: Secondary | ICD-10-CM | POA: Diagnosis not present

## 2021-10-01 DIAGNOSIS — R1312 Dysphagia, oropharyngeal phase: Secondary | ICD-10-CM | POA: Diagnosis not present

## 2021-10-01 DIAGNOSIS — Z6829 Body mass index (BMI) 29.0-29.9, adult: Secondary | ICD-10-CM | POA: Diagnosis not present

## 2021-10-01 DIAGNOSIS — I351 Nonrheumatic aortic (valve) insufficiency: Secondary | ICD-10-CM | POA: Diagnosis present

## 2021-10-01 DIAGNOSIS — K5900 Constipation, unspecified: Secondary | ICD-10-CM | POA: Diagnosis not present

## 2021-10-01 DIAGNOSIS — I69191 Dysphagia following nontraumatic intracerebral hemorrhage: Secondary | ICD-10-CM

## 2021-10-01 DIAGNOSIS — Z888 Allergy status to other drugs, medicaments and biological substances status: Secondary | ICD-10-CM

## 2021-10-01 DIAGNOSIS — M5432 Sciatica, left side: Secondary | ICD-10-CM | POA: Diagnosis not present

## 2021-10-01 DIAGNOSIS — Z79899 Other long term (current) drug therapy: Secondary | ICD-10-CM | POA: Diagnosis not present

## 2021-10-01 DIAGNOSIS — N951 Menopausal and female climacteric states: Secondary | ICD-10-CM | POA: Diagnosis present

## 2021-10-01 DIAGNOSIS — N390 Urinary tract infection, site not specified: Secondary | ICD-10-CM | POA: Diagnosis not present

## 2021-10-01 DIAGNOSIS — I610 Nontraumatic intracerebral hemorrhage in hemisphere, subcortical: Secondary | ICD-10-CM | POA: Diagnosis not present

## 2021-10-01 DIAGNOSIS — Z881 Allergy status to other antibiotic agents status: Secondary | ICD-10-CM | POA: Diagnosis not present

## 2021-10-01 DIAGNOSIS — M792 Neuralgia and neuritis, unspecified: Secondary | ICD-10-CM | POA: Diagnosis not present

## 2021-10-01 DIAGNOSIS — M797 Fibromyalgia: Secondary | ICD-10-CM | POA: Diagnosis present

## 2021-10-01 DIAGNOSIS — E669 Obesity, unspecified: Secondary | ICD-10-CM | POA: Diagnosis present

## 2021-10-01 DIAGNOSIS — R42 Dizziness and giddiness: Secondary | ICD-10-CM | POA: Diagnosis not present

## 2021-10-01 DIAGNOSIS — R300 Dysuria: Secondary | ICD-10-CM | POA: Diagnosis not present

## 2021-10-01 DIAGNOSIS — Z1152 Encounter for screening for COVID-19: Secondary | ICD-10-CM

## 2021-10-01 DIAGNOSIS — F419 Anxiety disorder, unspecified: Secondary | ICD-10-CM | POA: Diagnosis not present

## 2021-10-01 LAB — BASIC METABOLIC PANEL
Anion gap: 9 (ref 5–15)
BUN: 10 mg/dL (ref 8–23)
CO2: 24 mmol/L (ref 22–32)
Calcium: 9.3 mg/dL (ref 8.9–10.3)
Chloride: 104 mmol/L (ref 98–111)
Creatinine, Ser: 0.57 mg/dL (ref 0.44–1.00)
GFR, Estimated: >60 mL/min (ref >60)
Glucose, Bld: 116 mg/dL — ABNORMAL HIGH (ref 70–99)
Potassium: 3.7 mmol/L (ref 3.5–5.1)
Sodium: 137 mmol/L (ref 135–145)

## 2021-10-01 LAB — CBC
HCT: 35.2 % — ABNORMAL LOW (ref 36.0–46.0)
Hemoglobin: 11.7 g/dL — ABNORMAL LOW (ref 12.0–15.0)
MCH: 30.4 pg (ref 26.0–34.0)
MCHC: 33.2 g/dL (ref 30.0–36.0)
MCV: 91.4 fL (ref 80.0–100.0)
Platelets: 244 10*3/uL (ref 150–400)
RBC: 3.85 MIL/uL — ABNORMAL LOW (ref 3.87–5.11)
RDW: 14.4 % (ref 11.5–15.5)
WBC: 8.1 10*3/uL (ref 4.0–10.5)
nRBC: 0 % (ref 0.0–0.2)

## 2021-10-01 MED ORDER — ACETAMINOPHEN 650 MG RE SUPP
650.0000 mg | RECTAL | Status: DC | PRN
  Filled 2021-10-01: qty 1

## 2021-10-01 MED ORDER — POLYVINYL ALCOHOL 1.4 % OP SOLN
1.0000 [drp] | OPHTHALMIC | Status: DC | PRN
  Administered 2021-10-01 – 2021-10-11 (×6): 1 [drp] via OPHTHALMIC

## 2021-10-01 MED ORDER — MELATONIN 3 MG PO TABS
3.0000 mg | ORAL_TABLET | Freq: Every day | ORAL | Status: DC
Start: 2021-10-01 — End: 2021-11-07
  Administered 2021-10-01 – 2021-11-05 (×36): 3 mg via ORAL
  Filled 2021-10-01 (×36): qty 1

## 2021-10-01 MED ORDER — ROSUVASTATIN CALCIUM 20 MG PO TABS: 20.0000 mg | ORAL_TABLET | Freq: Every day | ORAL | Status: DC

## 2021-10-01 MED ORDER — EXERCISE FOR HEART AND HEALTH BOOK
Freq: Once | Status: AC
Start: 2021-10-01 — End: 2021-10-01
  Filled 2021-10-01: qty 1

## 2021-10-01 MED ORDER — MELATONIN 3 MG PO TABS: 3.0000 mg | ORAL_TABLET | Freq: Every day | ORAL | 0 refills | Status: DC

## 2021-10-01 MED ORDER — METOPROLOL TARTRATE 25 MG PO TABS
25.0000 mg | ORAL_TABLET | Freq: Two times a day (BID) | ORAL | Status: DC
  Administered 2021-10-01 – 2021-10-22 (×42): 25 mg via ORAL
  Filled 2021-10-01 (×42): qty 1

## 2021-10-01 MED ORDER — ACETAMINOPHEN 325 MG PO TABS
650.0000 mg | ORAL_TABLET | ORAL | Status: DC | PRN
  Administered 2021-10-01 – 2021-11-06 (×94): 650 mg via ORAL
  Filled 2021-10-01 (×98): qty 2

## 2021-10-01 MED ORDER — PANTOPRAZOLE SODIUM 40 MG PO TBEC
40.0000 mg | DELAYED_RELEASE_TABLET | Freq: Every day | ORAL | Status: DC
Start: 2021-10-01 — End: 2021-11-07
  Administered 2021-10-01 – 2021-11-06 (×34): 40 mg via ORAL
  Filled 2021-10-01 (×37): qty 1

## 2021-10-01 MED ORDER — BLOOD PRESSURE CONTROL BOOK
Freq: Once | Status: AC
Start: 2021-10-01 — End: 2021-10-01
  Filled 2021-10-01: qty 1

## 2021-10-01 MED ORDER — POLYETHYLENE GLYCOL 3350 17 G PO PACK
17.0000 g | PACK | Freq: Two times a day (BID) | ORAL | Status: DC
  Administered 2021-10-01 – 2021-10-20 (×17): 17 g via ORAL
  Filled 2021-10-01 (×36): qty 1

## 2021-10-01 MED ORDER — NITROFURANTOIN MACROCRYSTAL 50 MG PO CAPS
50.0000 mg | ORAL_CAPSULE | Freq: Every day | ORAL | Status: DC
  Administered 2021-10-02 – 2021-10-03 (×2): 50 mg via ORAL
  Filled 2021-10-01 (×3): qty 1

## 2021-10-01 MED ORDER — ROSUVASTATIN CALCIUM 20 MG PO TABS
20.0000 mg | ORAL_TABLET | Freq: Every day | ORAL | Status: DC
  Administered 2021-10-02 – 2021-11-06 (×36): 20 mg via ORAL
  Filled 2021-10-01 (×36): qty 1

## 2021-10-01 MED ORDER — SENNOSIDES-DOCUSATE SODIUM 8.6-50 MG PO TABS
1.0000 | ORAL_TABLET | Freq: Two times a day (BID) | ORAL | Status: DC
  Administered 2021-10-01 – 2021-10-05 (×8): 1 via ORAL
  Filled 2021-10-01 (×8): qty 1

## 2021-10-01 MED ORDER — MUSCLE RUB 10-15 % EX CREA
TOPICAL_CREAM | CUTANEOUS | Status: DC | PRN
  Administered 2021-10-07: 1 via TOPICAL
  Filled 2021-10-01: qty 85

## 2021-10-01 MED ORDER — ACETAMINOPHEN 160 MG/5ML PO SOLN: 650.0000 mg | ORAL | Status: DC | PRN

## 2021-10-01 MED ORDER — LOSARTAN POTASSIUM 50 MG PO TABS
50.0000 mg | ORAL_TABLET | Freq: Every day | ORAL | Status: DC
Start: 2021-10-02 — End: 2021-11-07
  Administered 2021-10-02 – 2021-11-06 (×36): 50 mg via ORAL
  Filled 2021-10-01 (×36): qty 1

## 2021-10-01 MED ORDER — ADULT MULTIVITAMIN W/MINERALS CH
1.0000 | ORAL_TABLET | Freq: Every day | ORAL | Status: DC
  Administered 2021-10-02 – 2021-11-05 (×33): 1 via ORAL
  Filled 2021-10-01 (×36): qty 1

## 2021-10-01 MED ORDER — SENNOSIDES-DOCUSATE SODIUM 8.6-50 MG PO TABS: 1.0000 | ORAL_TABLET | Freq: Two times a day (BID) | ORAL | Status: DC

## 2021-10-01 MED ORDER — POLYETHYLENE GLYCOL 3350 17 G PO PACK: 17.0000 g | PACK | Freq: Two times a day (BID) | ORAL | 0 refills | Status: DC

## 2021-10-01 NOTE — Progress Notes (Signed)
Inpatient Rehabilitation Admission Medication Review by a Pharmacist  A complete drug regimen review was completed for this patient to identify any potential clinically significant medication issues.  High Risk Drug Classes Is patient taking? Indication by Medication  Antipsychotic No   Anticoagulant No   Antibiotic Yes Macrodantin- UTI prophylaxis  Opioid No   Antiplatelet No   Hypoglycemics/insulin No   Vasoactive Medication Yes Cozaar, lopressor- hypertension  Chemotherapy No   Other Yes Protonix- GERD Crestor- HLD Melatonin- sleep     Type of Medication Issue Identified Description of Issue Recommendation(s)  Drug Interaction(s) (clinically significant)     Duplicate Therapy     Allergy     No Medication Administration End Date     Incorrect Dose     Additional Drug Therapy Needed     Significant med changes from prior encounter (inform family/care partners about these prior to discharge).    Other  Toprol XL 50 mg po qday Restart PTA meds when and if necessary during CIR admission or at time of discharge, if warranted  Patient has been changed to lopressor on the acute inpatient side. May consider changing back to XL formulation PTD    Clinically significant medication issues were identified that warrant physician communication and completion of prescribed/recommended actions by midnight of the next day:  No   Time spent performing this drug regimen review (minutes):  30  Telecia Larocque BS, PharmD, BCPS Clinical Pharmacist 10/01/2021 5:03 PM  Contact: [phone] after 3 PM  "Be curious, not judgmental..." -Debbora Dus

## 2021-10-01 NOTE — Discharge Summary (Signed)
Physician Discharge Summary   Patient: Charlotte Hunter MRN: [id number] DOB: 1955/04/22  Admit date:     09/25/2021  Discharge date: 10/01/21  Discharge Physician: Hosie Poisson   PCP: Su Grand, MD   Recommendations at discharge:  Please check CT head without contrast in 2 weeks from the time of [place] , around 10/12/2021 and discuss with Dr Leonie Man prior to restarting anti coaguation .  Please follow up with PCP in 2 weeks.  Please follow up with cardiologist on discharge.  Please follow up with neurologist on discharge as scheduled.   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  Patient was intubated in ED, Andexxa administered to reverse anticoagulation.  Started on Cleviprex gtt and hypertonic saline 3%, and transferred to [facility] neuro ICU.  MRI brain on 6/15 with unchanged size and morphology of IPH, surrounding edema and mildly increased regional mass effect, intraventricular extension with a small volume intraventricular hemorrhage without hydrocephalus or trapping.  MRA head with focal severe distal left P2 stenosis.  Carotid US and TTE without significant finding.     Patient was extubated on 6/15, and transferred to [facility] hospitalist service on 6/17.  SLP recommended dysphagia 3 diet.  Therapy recommended CIR.    Patient is medically optimized for transfer to CIR once bed available.     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  - intubated for airway protection from 6/13-6/15.  - started her on statin.  - anti coagulation on hold for ICH, Dr Leonie Man recommended repeat CT head without contrast in 2 weeks , which is around 10/12/2021 and discuss there results with Dr Leonie Man before restarting the patient on Xarelto.      Dysphagia:  Secondary to stroke.  Dysphagia 3 diet. Per SLP.      Paroxysmal Atrial Fib:  S/p Ablation .  Sinus rhythm. Recommend outpatient follo wup with cardiology on discharge.  Resume metoprolol.        Focal severe distal left P2 stenosis contributing to blurry vision:      Blurry vision:  Recommend outpatient follow up with ophthalmology.      Hypertension:  Optimal BP parameters .  Continue with losartan and bb ,.     Constipation:  - continue with senokot BID.  - With prn miralax.      Hypokalemia/ hypophosphatemia Resolved.      Gerd PPI.    Hyperlipidemia:  LDL is 134, On statin on discharge.      Malnutrition Type:   Nutrition Problem: Increased nutrient needs Etiology: acute illness     Malnutrition Characteristics:   Signs/Symptoms: estimated needs     Nutrition Interventions:   Interventions: MVI, Hormel Shake   Estimated body mass index is 29.21 kg/m as calculated from the following:   Height as of this encounter: 5\' 6"  (1.676 m).   Weight  as of this encounter: 82.1 kg.        Consultants: neurology.  Procedures performed: MRI brain Carotid duplex TTE   Disposition: Rehabilitation facility Diet recommendation:  Discharge Diet Orders (From admission, onward)     Start     Ordered   10/01/21 0000  Diet - low sodium heart healthy        10/01/21 1218           Dysphagia type 3 THIN Liquid DISCHARGE MEDICATION: Allergies as of 10/01/2021       Reactions   Cefzil [cefprozil] Other  (See Comments)   Codeine Anaphylaxis, Itching   Ms Contin [morphine] Hives   Roxicodone [oxycodone] Hives   Pork-derived Products Other (See Comments)   Iodinated Contrast Media Diarrhea   Zestril [lisinopril] Cough        Medication List     STOP taking these medications    nitrofurantoin 50 MG capsule Commonly known as: MACRODANTIN   pantoprazole 20 MG tablet Commonly known as: PROTONIX   rivaroxaban 20 MG Tabs tablet Commonly known as: XARELTO       TAKE these medications    cetirizine 10 MG tablet Commonly known as: ZYRTEC Take 10 mg by mouth daily. PRN   estradiol 0.5 MG tablet Commonly known as: ESTRACE Take 0.5 mg by mouth daily.   losartan 50 MG tablet Commonly known as: COZAAR Take 50 mg by mouth daily.   melatonin 3 MG Tabs tablet Take 1 tablet (3 mg total) by mouth at bedtime.   metoprolol succinate 50 MG 24 hr tablet Commonly known as: TOPROL-XL Take 50 mg by mouth daily.   Multi-Vitamin tablet Take by mouth.   oxymetazoline 0.05 % nasal spray Commonly known as: AFRIN Place 1 spray into both nostrils. 3-4 times daily   phenazopyridine 100 MG tablet Commonly known as: PYRIDIUM Take 100 mg by mouth 3 (three) times daily as needed for pain.   polyethylene glycol 17 g packet Commonly known as: MIRALAX / GLYCOLAX Take 17 g by mouth 2 (two) times daily.   progesterone 100 MG capsule Commonly known as: PROMETRIUM Take 100 mg by mouth daily.   rosuvastatin 20 MG tablet Commonly known as: CRESTOR Take 1 tablet (20 mg total) by mouth daily. Start taking on: October 02, 2021   senna-docusate 8.6-50 MG tablet Commonly known as: Senokot-S Take 1 tablet by mouth 2 (two) times daily.   vitamin B-12 500 MCG tablet Commonly known as: CYANOCOBALAMIN Take 2,500 mcg by mouth daily.        Follow-up Information     Garvin Fila, MD. Schedule an appointment as soon as possible for a visit in 1 month(s).   Specialties: Neurology,  Radiology Why: stroke clinic Contact information: [place] [id number] [phone]                Discharge Exam: Danley Danker Weights   09/25/21 2340  Weight: 82.1 kg   General exam: Appears calm and comfortable  Respiratory system: Clear to auscultation. Respiratory effort normal. Cardiovascular system: S1 & S2 heard, RRR. No JVD, murmurs, rubs, gallops or clicks. No pedal edema. Gastrointestinal system: Abdomen is nondistended, soft and nontender. No organomegaly or masses felt. Normal bowel sounds heard. Central nervous system: Alert and oriented. No focal neurological deficits. Extremities: Symmetric 5 x 5 power. Skin: No rashes, lesions or ulcers Psychiatry: Judgement and insight appear normal. Mood & affect appropriate.    Condition at discharge: fair  The results  of significant diagnostics from this hospitalization (including imaging, microbiology, ancillary and laboratory) are listed below for reference.   Imaging Studies: VAS US CAROTID  Result Date: 09/29/2021 Carotid Arterial Duplex Study Patient Name:  SHARNE BALOUGH  Date of Exam:   09/27/2021 Medical Rec #: [id number]         Accession #:    [id number] Date of Birth: 03-16-1956         Patient Gender: F Patient Age:   15 years Exam Location:  [facility] Procedure:      VAS US CAROTID Referring Phys: Rosalin Hawking --------------------------------------------------------------------------------  Performing Technologist: Darlin Coco RDMS, RVT  Examination Guidelines: A complete evaluation includes B-mode imaging, spectral Doppler, color Doppler, and power Doppler as needed of all accessible portions of each vessel. Bilateral testing is considered an integral part of a complete examination. Limited examinations for reoccurring indications may be performed as noted.  Right Carotid Findings: +----------+--------+--------+--------+------------------+------------------+           PSV cm/sEDV  cm/sStenosisPlaque DescriptionComments           +----------+--------+--------+--------+------------------+------------------+ CCA Prox  89      13                                                   +----------+--------+--------+--------+------------------+------------------+ CCA Distal70      14                                intimal thickening +----------+--------+--------+--------+------------------+------------------+ ICA Prox  65      16                                intimal thickening +----------+--------+--------+--------+------------------+------------------+ ICA Mid   98      26                                                   +----------+--------+--------+--------+------------------+------------------+ ICA Distal93      29                                                   +----------+--------+--------+--------+------------------+------------------+ ECA       93      7                                                    +----------+--------+--------+--------+------------------+------------------+ +----------+--------+-------+---------+-------------------+           PSV cm/sEDV cmsDescribe Arm Pressure (mmHG) +----------+--------+-------+---------+-------------------+ [id number]            Turbulent                    +----------+--------+-------+---------+-------------------+ +---------+--------+--+--------+--+---------+ VertebralPSV cm/s73EDV cm/s21Antegrade +---------+--------+--+--------+--+---------+  Left Carotid Findings: +----------+--------+--------+--------+------------------+------------------+           PSV cm/sEDV cm/sStenosisPlaque DescriptionComments           +----------+--------+--------+--------+------------------+------------------+  CCA Prox  102     20                                                   +----------+--------+--------+--------+------------------+------------------+ CCA Distal90      19                                                    +----------+--------+--------+--------+------------------+------------------+ ICA Prox  68      15                                intimal thickening +----------+--------+--------+--------+------------------+------------------+ ICA Mid   129     29                                                   +----------+--------+--------+--------+------------------+------------------+ ICA Distal133     36                                                   +----------+--------+--------+--------+------------------+------------------+ ECA       86      11                                                   +----------+--------+--------+--------+------------------+------------------+ +----------+--------+--------+---------+-------------------+           PSV cm/sEDV cm/sDescribe Arm Pressure (mmHG) +----------+--------+--------+---------+-------------------+ [id number]             Turbulent                    +----------+--------+--------+---------+-------------------+ +---------+--------+--+--------+--+---------+ VertebralPSV cm/s69EDV cm/s15Antegrade +---------+--------+--+--------+--+---------+   Summary: Right Carotid: The extracranial vessels were near-normal with only minimal wall                thickening or plaque. Left Carotid: The extracranial vessels were near-normal with only minimal wall               thickening or plaque. Vertebrals:  Bilateral vertebral arteries demonstrate antegrade flow. Subclavians: Bilateral subclavian artery flow was disturbed. *See table(s) above for measurements and observations.  Electronically signed by Antony Contras MD on 09/29/2021 at 12:34:29 PM.    Final    DG Swallowing Func-Speech Pathology  Result Date: 09/28/2021 Table formatting from the original result was not included. Objective Swallowing Evaluation: Type of Study: MBS-Modified Barium Swallow Study  Patient Details Name: Charlotte Hunter MRN: [id number] Date of Birth: 06-Dec-1955 Today's Date: 09/28/2021 Time: SLP Start Time (ACUTE ONLY): 1210 -SLP Stop Time (ACUTE ONLY): 1233 SLP Time Calculation (min) (ACUTE ONLY): 23 min Past Medical History: Past Medical History: Diagnosis Date  A-fib [facility])   Aortic insufficiency   Hypertension  Past Surgical History: The histories are not reviewed yet. Please review  them in the "History" navigator section and refresh this [place]. HPI: 66 y.o. female presents to [facility] hospital on [facility]/2021 with L weakness. 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  Pt required intubation 6/13-6/15. PMH includes HTN, PAF, GERD, UTI.  Subjective: denies trouble swallowing before  Recommendations for follow up therapy are one component of a multi-disciplinary discharge planning process, led by the attending physician.  Recommendations may be updated based on patient status, additional functional criteria and insurance authorization. Assessment / Plan / Recommendation   09/28/2021   1:00 PM Clinical Impressions Clinical Impression Pt has a mild oropharyngeal dysphagia. Her oral phase and mastication are slow with solid consistencies, and she has a tendency to hold liquids in her mouth before swallowing. Although this takes time, she does not necessarily need to be prompted to swallow. She has mildly reduced airway closure and if her timing is slightly off, thin liquids will enter her airway. This is primarily penetration but at times it rests on the vocal cords, and there is aspiration (PAS 7) on consecutive sips. A chin tuck does not improve airway protection, but thicker liquids do. Recommend starting with Dys 3 diet and nectar thick liquids.  SLP Visit Diagnosis Dysphagia, oropharyngeal phase (R13.12) Impact on safety and function Mild aspiration risk;Moderate aspiration risk     09/28/2021   1:00 PM Treatment Recommendations Treatment Recommendations Therapy as outlined in treatment plan below     09/28/2021   1:00 PM  Prognosis Prognosis for Safe Diet Advancement Good   09/28/2021   1:00 PM Diet Recommendations SLP Diet Recommendations Dysphagia 3 (Mech soft) solids;Nectar thick liquid Liquid Administration via Cup;Straw Medication Administration Whole meds with puree Compensations Minimize environmental distractions;Slow rate;Small sips/bites Postural Changes Seated upright at 90 degrees;Remain semi-upright after after feeds/meals (Comment)     09/28/2021   1:00 PM Other Recommendations Oral Care Recommendations Oral care BID Follow Up Recommendations Acute inpatient rehab (3hours/day) Assistance recommended at discharge Frequent or constant Supervision/Assistance Functional Status Assessment Patient has had a recent decline in their functional status and demonstrates the ability to make significant improvements in function in a reasonable and predictable amount of time.   09/28/2021   1:00 PM Frequency and Duration  Speech Therapy Frequency (ACUTE ONLY) min 2x/week Treatment Duration 2 weeks     09/28/2021   1:00 PM Oral Phase Oral Phase Impaired Oral - Nectar Cup Holding of bolus Oral - Nectar Straw Holding of bolus Oral - Thin Cup Holding of bolus Oral - Thin Straw Holding of bolus Oral - Puree Delayed oral transit Oral - Regular Delayed oral transit;Impaired mastication Oral - Pill Delayed oral transit    09/28/2021   1:00 PM Pharyngeal Phase Pharyngeal Phase Impaired Pharyngeal- Nectar Cup WFL Pharyngeal- Nectar Straw WFL Pharyngeal- Thin Cup Reduced airway/laryngeal closure;Penetration/Aspiration during swallow Pharyngeal Material enters airway, CONTACTS cords and not ejected out Pharyngeal- Thin Straw Penetration/Aspiration before swallow;Penetration/Aspiration during swallow;Compensatory strategies attempted (with notebox) Pharyngeal Material enters airway, passes BELOW cords and not ejected out despite cough attempt by patient Pharyngeal- Puree WFL Pharyngeal- Regular WFL Pharyngeal- Pill [facility]    09/28/2021   1:00 PM Cervical  Esophageal Phase  Cervical Esophageal Phase [facility] Osie Bond., M.A. CCC-SLP Acute Rehabilitation Services Office [phone] Secure chat preferred 09/28/2021, 2:19 PM                     MR BRAIN WO CONTRAST  Result Date: 09/27/2021 CLINICAL DATA:  Follow-up examination for hemorrhagic stroke. EXAM: MRI HEAD WITHOUT  CONTRAST MRA HEAD WITHOUT CONTRAST TECHNIQUE: Multiplanar, multi-echo pulse sequences of the brain and surrounding structures were acquired without intravenous contrast. Angiographic images of the Circle of Willis were acquired using MRA technique without intravenous contrast. COMPARISON:  Prior CT from 09/25/2021. FINDINGS: MRI HEAD FINDINGS Brain: Cerebral volume within normal limits. Scattered patchy T2/FLAIR hyperintensity involving the periventricular deep white matter both cerebral hemispheres, most consistent with chronic small vessel ischemic disease, moderately advanced in nature. Previously identified acute intraparenchymal hemorrhage centered at the right thalamus again seen, not significantly changed in size or morphology as compared to prior CT. Lesion measures approximately 2.9 x 1.6 x 2.2 cm by MRI. Surrounding T2/FLAIR signal consistent with edema, mildly increased from prior. Edema extends inferiorly into the right cerebral peduncle/midbrain. Mild localized right-to-left shift. Intraventricular extension with small volume intraventricular hemorrhage, similar as well. No hydrocephalus or trapping. No other evidence for acute or subacute ischemia. Focus of mild diffusion signal abnormality at the posterior left frontal centrum semi ovale felt to be related to susceptibility artifact (series 5, image 87). No other acute intracranial hemorrhage. Few scattered chronic micro hemorrhages noted, likely hypertensive in nature. No mass lesion or extra-axial fluid collection. Pituitary gland suprasellar region within normal limits. Vascular: Major intracranial vascular flow voids are maintained.  Skull and upper cervical spine: Craniocervical junction within normal limits. Bone marrow signal intensity normal. Small lipoma at the right frontal scalp noted. Scalp soft tissues otherwise unremarkable. Sinuses/Orbits: Globes orbital soft tissues within normal limits. Mild scattered mucosal thickening noted about the ethmoidal air cells. Few small left maxillary sinus retention cyst noted. Trace right mastoid effusion. Patient is intubated. Other: None. MRA HEAD FINDINGS Anterior circulation: Visualized distal cervical segments of the internal carotid arteries are patent with antegrade flow. Distal cervical right ICA is tortuous. Petrous, cavernous, supraclinoid segments patent without stenosis or other abnormality. A1 segments patent bilaterally. Normal anterior communicating artery complex. Anterior cerebral arteries patent without stenosis. No M1 stenosis or occlusion. No proximal MCA branch occlusion. Distal MCA branches perfused and symmetric. Posterior circulation: Both vertebral arteries patent without stenosis. Left vertebral artery slightly dominant. Both PICA patent. Basilar patent to its distal aspect without stenosis. Short-segment fenestration involving distal basilar artery just prior to the apex noted. Superior cerebellar arteries patent bilaterally. Both PCA supplied via the basilar as well as small bilateral posterior communicating arteries. Right PCA widely patent to its distal aspect. Focal severe distal left P2 stenosis noted (series 1033, image 4). Left PCA otherwise patent as well. Anatomic variants: None significant. No intracranial aneurysm. No other vascular malformation seen underlying the right thalamic hemorrhage. IMPRESSION: MRI HEAD IMPRESSION: 1. Unchanged size and morphology of acute intraparenchymal hemorrhage centered at the right thalamus. Surrounding edema and regional mass effect has mildly increased as compared to prior CT from 09/25/2021. 2. Intraventricular extension with  small volume intraventricular hemorrhage, similar. No hydrocephalus or trapping. 3. Underlying moderate chronic microvascular ischemic disease. MRA HEAD IMPRESSION: 1. Negative intracranial MRA for large vessel occlusion. 2. Focal severe distal left P2 stenosis. 3. Otherwise negative intracranial MRA. No other hemodynamically significant or correctable stenosis. No aneurysm or other vascular malformation seen underlying the right thalamic hemorrhage. Electronically Signed   By: Rise Mu M.D.   On: 09/27/2021 06:06   MR ANGIO HEAD WO CONTRAST  Result Date: 09/27/2021 CLINICAL DATA:  Follow-up examination for hemorrhagic stroke. EXAM: MRI HEAD WITHOUT CONTRAST MRA HEAD WITHOUT CONTRAST TECHNIQUE: Multiplanar, multi-echo pulse sequences of the brain and surrounding structures were acquired without intravenous contrast.  Angiographic images of the Circle of Willis were acquired using MRA technique without intravenous contrast. COMPARISON:  Prior CT from 09/25/2021. FINDINGS: MRI HEAD FINDINGS Brain: Cerebral volume within normal limits. Scattered patchy T2/FLAIR hyperintensity involving the periventricular deep white matter both cerebral hemispheres, most consistent with chronic small vessel ischemic disease, moderately advanced in nature. Previously identified acute intraparenchymal hemorrhage centered at the right thalamus again seen, not significantly changed in size or morphology as compared to prior CT. Lesion measures approximately 2.9 x 1.6 x 2.2 cm by MRI. Surrounding T2/FLAIR signal consistent with edema, mildly increased from prior. Edema extends inferiorly into the right cerebral peduncle/midbrain. Mild localized right-to-left shift. Intraventricular extension with small volume intraventricular hemorrhage, similar as well. No hydrocephalus or trapping. No other evidence for acute or subacute ischemia. Focus of mild diffusion signal abnormality at the posterior left frontal centrum semi ovale  felt to be related to susceptibility artifact (series 5, image 87). No other acute intracranial hemorrhage. Few scattered chronic micro hemorrhages noted, likely hypertensive in nature. No mass lesion or extra-axial fluid collection. Pituitary gland suprasellar region within normal limits. Vascular: Major intracranial vascular flow voids are maintained. Skull and upper cervical spine: Craniocervical junction within normal limits. Bone marrow signal intensity normal. Small lipoma at the right frontal scalp noted. Scalp soft tissues otherwise unremarkable. Sinuses/Orbits: Globes orbital soft tissues within normal limits. Mild scattered mucosal thickening noted about the ethmoidal air cells. Few small left maxillary sinus retention cyst noted. Trace right mastoid effusion. Patient is intubated. Other: None. MRA HEAD FINDINGS Anterior circulation: Visualized distal cervical segments of the internal carotid arteries are patent with antegrade flow. Distal cervical right ICA is tortuous. Petrous, cavernous, supraclinoid segments patent without stenosis or other abnormality. A1 segments patent bilaterally. Normal anterior communicating artery complex. Anterior cerebral arteries patent without stenosis. No M1 stenosis or occlusion. No proximal MCA branch occlusion. Distal MCA branches perfused and symmetric. Posterior circulation: Both vertebral arteries patent without stenosis. Left vertebral artery slightly dominant. Both PICA patent. Basilar patent to its distal aspect without stenosis. Short-segment fenestration involving distal basilar artery just prior to the apex noted. Superior cerebellar arteries patent bilaterally. Both PCA supplied via the basilar as well as small bilateral posterior communicating arteries. Right PCA widely patent to its distal aspect. Focal severe distal left P2 stenosis noted (series 1033, image 4). Left PCA otherwise patent as well. Anatomic variants: None significant. No intracranial aneurysm.  No other vascular malformation seen underlying the right thalamic hemorrhage. IMPRESSION: MRI HEAD IMPRESSION: 1. Unchanged size and morphology of acute intraparenchymal hemorrhage centered at the right thalamus. Surrounding edema and regional mass effect has mildly increased as compared to prior CT from 09/25/2021. 2. Intraventricular extension with small volume intraventricular hemorrhage, similar. No hydrocephalus or trapping. 3. Underlying moderate chronic microvascular ischemic disease. MRA HEAD IMPRESSION: 1. Negative intracranial MRA for large vessel occlusion. 2. Focal severe distal left P2 stenosis. 3. Otherwise negative intracranial MRA. No other hemodynamically significant or correctable stenosis. No aneurysm or other vascular malformation seen underlying the right thalamic hemorrhage. Electronically Signed   By: Jeannine Boga M.D.   On: 09/27/2021 06:06   DG Pelvis Portable  Result Date: 09/26/2021 CLINICAL DATA:  MRI screening, evaluate for metal. EXAM: PORTABLE PELVIS 1-2 VIEWS COMPARISON:  None Available. FINDINGS: There is no evidence for metallic foreign body. Phleboliths are noted in the pelvis. No dilated bowel loops. No acute fractures. IMPRESSION: No evidence for metallic foreign body in the pelvis or lower abdomen. Electronically Signed  By: Darliss Cheney M.D.   On: 09/26/2021 19:28   ECHOCARDIOGRAM COMPLETE  Result Date: 09/26/2021    ECHOCARDIOGRAM REPORT   Patient Name:   NAOMI CASTROGIOVANNI Date of Exam: 09/26/2021 Medical Rec #:  [id number]        Height: Accession #:    [id number]       Weight:       181.0 lb Date of Birth:  1955/10/18        BSA:          1.889 m Patient Age:    65 years         BP:           134/90 mmHg Patient Gender: F                HR:           62 bpm. Exam Location:  Inpatient Procedure: 2D Echo, Cardiac Doppler and Color Doppler                               MODIFIED REPORT: This report was modified by Dietrich Pates MD on 09/26/2021 due to Report review.   Indications:     Stroke  History:         Patient has prior history of Echocardiogram examinations, most                  recent 12/03/2019. Aortic Valve Disease, Arrythmias:Atrial                  Fibrillation; Risk Factors:Hypertension. Previous echo done at                  [facility]  Sonographer:     Rodrigo Ran RCS Referring Phys:  [id number] ERIC LINDZEN Diagnosing Phys: Dietrich Pates MD IMPRESSIONS  1. Left ventricular ejection fraction, by estimation, is 65 to 70%. The left ventricle has normal function. The left ventricle has no regional wall motion abnormalities. Left ventricular diastolic parameters were normal.  2. Right ventricular systolic function is normal. The right ventricular size is normal.  3. The mitral valve is normal in structure. Mild mitral valve regurgitation.  4. The aortic valve is normal in structure. Aortic valve regurgitation is trivial. Aortic valve sclerosis is present, with no evidence of aortic valve stenosis.  5. There is mild dilatation of the aortic root, measuring 40 mm.  6. The inferior vena cava is dilated in size with <50% respiratory variability, suggesting right atrial pressure of 15 mmHg. FINDINGS  Left Ventricle: Left ventricular ejection fraction, by estimation, is 65 to 70%. The left ventricle has normal function. The left ventricle has no regional wall motion abnormalities. The left ventricular internal cavity size was normal in size. There is  no left ventricular hypertrophy. Left ventricular diastolic parameters were normal. Right Ventricle: The right ventricular size is normal. Right vetricular wall thickness was not assessed. Right ventricular systolic function is normal. Left Atrium: Left atrial size was normal in size. Right Atrium: Right atrial size was normal in size. Pericardium: Trivial pericardial effusion is present. Mitral Valve: The mitral valve is normal in structure. Mild mitral valve regurgitation. Tricuspid Valve: The tricuspid valve is normal in  structure. Tricuspid valve regurgitation is trivial. Aortic Valve: The aortic valve is normal in structure. Aortic valve regurgitation is trivial. Aortic regurgitation PHT measures 683 msec. Aortic valve sclerosis is present, with no evidence of aortic valve stenosis.  Aortic valve mean gradient measures 5.0 mmHg. Aortic valve peak gradient measures 9.4 mmHg. Aortic valve area, by VTI measures 2.81 cm. Pulmonic Valve: The pulmonic valve was normal in structure. Pulmonic valve regurgitation is trivial. Aorta: The aortic root and ascending aorta are structurally normal, with no evidence of dilitation. There is mild dilatation of the aortic root, measuring 40 mm. Venous: The inferior vena cava is dilated in size with less than 50% respiratory variability, suggesting right atrial pressure of 15 mmHg. IAS/Shunts: No atrial level shunt detected by color flow Doppler.  LEFT VENTRICLE PLAX 2D LVIDd:         4.40 cm   Diastology LVIDs:         2.80 cm   LV e' medial:    8.73 cm/s LV PW:         0.90 cm   LV E/e' medial:  12.9 LV IVS:        1.00 cm   LV e' lateral:   11.30 cm/s LVOT diam:     2.30 cm   LV E/e' lateral: 10.0 LV SV:         99 LV SV Index:   53 LVOT Area:     4.15 cm  RIGHT VENTRICLE             IVC RV Basal diam:  3.90 cm     IVC diam: 2.20 cm RV Mid diam:    2.70 cm RV S prime:     15.30 cm/s TAPSE (M-mode): 2.1 cm LEFT ATRIUM             Index        RIGHT ATRIUM           Index LA diam:        3.20 cm 1.69 cm/m   RA Area:     15.40 cm LA Vol (A2C):   54.1 ml 28.64 ml/m  RA Volume:   36.30 ml  19.21 ml/m LA Vol (A4C):   43.2 ml 22.87 ml/m LA Biplane Vol: 50.5 ml 26.73 ml/m  AORTIC VALVE                     PULMONIC VALVE AV Area (Vmax):    3.18 cm      PV Vmax:          0.95 m/s AV Area (Vmean):   2.86 cm      PV Peak grad:     3.6 mmHg AV Area (VTI):     2.81 cm      PR End Diast Vel: 3.65 msec AV Vmax:           153.00 cm/s AV Vmean:          110.000 cm/s AV VTI:            0.353 m AV Peak Grad:       9.4 mmHg AV Mean Grad:      5.0 mmHg LVOT Vmax:         117.00 cm/s LVOT Vmean:        75.600 cm/s LVOT VTI:          0.239 m LVOT/AV VTI ratio: 0.68 AI PHT:            683 msec  AORTA Ao Root diam: 4.00 cm Ao Asc diam:  3.00 cm MITRAL VALVE                TRICUSPID VALVE MV Area (  PHT): 4.26 cm     TR Peak grad:   25.0 mmHg MV Decel Time: 178 msec     TR Vmax:        250.00 cm/s MV E velocity: 113.00 cm/s MV A velocity: 99.50 cm/s   SHUNTS MV E/A ratio:  1.14         Systemic VTI:  0.24 m                             Systemic Diam: 2.30 cm Dorris Carnes MD Electronically signed by Dorris Carnes MD Signature Date/Time: 09/26/2021/2:49:05 PM    Final (Updated)    DG Abd 1 View  Result Date: 09/25/2021 CLINICAL DATA:  OG tube placement EXAM: ABDOMEN - 1 VIEW COMPARISON:  09/25/2021 FINDINGS: Esophageal tube tip overlies the stomach, side port probably in the region of GE junction. Possible dilated segment of small bowel in the central abdomen. There is gas present in the colon IMPRESSION: Esophageal tube tip overlies the stomach, side-port in the region of GE junction, further advancement suggested for more optimal positioning Electronically Signed   By: Donavan Foil M.D.   On: 09/25/2021 23:27   CT HEAD WO CONTRAST (5MM)  Result Date: 09/25/2021 CLINICAL DATA:  Follow-up examination for intracranial hemorrhage. EXAM: CT HEAD WITHOUT CONTRAST TECHNIQUE: Contiguous axial images were obtained from the base of the skull through the vertex without intravenous contrast. RADIATION DOSE REDUCTION: This exam was performed according to the departmental dose-optimization program which includes automated exposure control, adjustment of the mA and/or kV according to patient size and/or use of iterative reconstruction technique. COMPARISON:  Prior CT from earlier the same day. FINDINGS: Brain: Previously identified acute intraparenchymal hemorrhage centered at the right thalamus again seen, not significantly changed in size  measuring 2.2 x 1.5 x 2.3 cm (estimated volume 4 mL, stable when measured in a similar fashion). Mild inferior extension towards the right cerebral peduncle. Intraventricular extension with small volume hemorrhage within the third and fourth ventricles, stable. No hydrocephalus or trapping. No other new acute intracranial hemorrhage. No acute large vessel territory infarct. Scattered hypodensity involving the supratentorial cerebral white matter noted, nonspecific, but most likely related chronic microvascular ischemic disease. No mass lesion or significant midline shift. No extra-axial fluid collection. Vascular: No hyperdense vessel. Skull: Scalp soft tissues and calvarium within normal limits. Few scattered foci of soft tissue emphysema within the left temporal region likely related IV access. Sinuses/Orbits: Globes and orbital soft tissues within normal limits. Scattered mucosal thickening noted about the ethmoidal air cells. Small retention cysts versus polyps noted within the visualized left maxillary sinus. Mastoid air cells are clear. Other: None. IMPRESSION: 1. No significant interval change in size of acute intraparenchymal hemorrhage centered at the right thalamus, estimated volume 4 mL. Small volume intraventricular extension with blood in the third and fourth ventricles. No hydrocephalus or trapping. 2. No other new acute intracranial abnormality. Electronically Signed   By: Jeannine Boga M.D.   On: 09/25/2021 21:26   DG Abdomen 1 View  Result Date: 09/25/2021 CLINICAL DATA:  Placement of NG tube EXAM: ABDOMEN - 1 VIEW COMPARISON:  None Available. FINDINGS: Distal portion of NG tube is coiled within the fundus of the stomach with its tip pointing coracoid in the region of body of the stomach. Visualized bowel gas pattern is unremarkable. There are linear densities in the lower lung fields suggesting scarring or subsegmental atelectasis. IMPRESSION: Tip of NG tube is  seen in the stomach.  Electronically Signed   By: Elmer Picker M.D.   On: 09/25/2021 15:50   DG Chest 1 View  Result Date: 09/25/2021 CLINICAL DATA:  Provided history: Intubation. Status post intubation and OG tube placement. EXAM: CHEST  1 VIEW COMPARISON:  Concurrently performed abdominal radiograph 09/25/2021. FINDINGS: ET tube present with tip terminating 2.2 cm above the level of the carina. An enteric tube passes below level of left hemidiaphragm and is coiled within the stomach. Heart size within normal limits. Aortic atherosclerosis. Mild bibasilar atelectasis. No appreciable airspace consolidation. No evidence of pleural effusion or pneumothorax. No acute bony abnormality identified. IMPRESSION: Support apparatus, as described. Mild bibasilar atelectasis. Aortic Atherosclerosis (ICD10-I70.0). Electronically Signed   By: Kellie Simmering D.O.   On: 09/25/2021 15:47   CT HEAD CODE STROKE WO CONTRAST  Result Date: 09/25/2021 CLINICAL DATA:  Code stroke.  Stroke suspected, no history given EXAM: CT HEAD WITHOUT CONTRAST TECHNIQUE: Contiguous axial images were obtained from the base of the skull through the vertex without intravenous contrast. RADIATION DOSE REDUCTION: This exam was performed according to the departmental dose-optimization program which includes automated exposure control, adjustment of the mA and/or kV according to patient size and/or use of iterative reconstruction technique. COMPARISON:  None Available. FINDINGS: Brain: Hemorrhage centered in the right thalamus and basal ganglia, which measures approximally 1.3 x 2.8 x 2.2 cm (AP x TR x CC) (series 4, image 13 and series 6, image 24). Surrounding hypodensity, likely mild edema. Hemorrhage is also noted in the third ventricle, cerebral aqueduct, and fourth ventricle. Mild local mass effect 1-2 mm of right-to-left midline shift midline shift. No evidence of acute cortical infarct, hydrocephalus, or extra-axial collection. Vascular: No hyperdense vessel.  Skull: No acute osseous abnormality. Sinuses/Orbits: No acute finding. Other: The mastoids are well aerated. IMPRESSION: Hemorrhage centered in the right thalamus and basal ganglia, with mild surrounding edema, minimal midline shift, and a small amount of intraventricular extension into the third and fourth ventricles. Code stroke imaging results were communicated on 09/25/2021 at 2:53 pm to provider BHAGAT via secure text paging. Electronically Signed   By: Merilyn Baba M.D.   On: 09/25/2021 14:56    Microbiology: Results for orders placed or performed during the hospital encounter of 09/25/21  MRSA Next Gen by PCR, Nasal     Status: None   Collection Time: 09/25/21 10:12 PM   Specimen: Nasal Mucosa; Nasal Swab  Result Value Ref Range Status   MRSA by PCR Next Gen NOT DETECTED NOT DETECTED Final    Comment: (NOTE) The GeneXpert MRSA Assay (FDA approved for NASAL specimens only), is one component of a comprehensive MRSA colonization surveillance program. It is not intended to diagnose MRSA infection nor to guide or monitor treatment for MRSA infections. Test performance is not FDA approved in patients less than 25 years old. Performed at [facility], [address], [place], [place] [address]     Labs: CBC: Recent Labs  Lab 09/25/21 1442 09/25/21 2321 09/27/21 0534 09/28/21 0354 09/29/21 0250 09/30/21 0210 10/01/21 0408  WBC 8.8  --  8.7 9.2 7.6 9.7 8.1  NEUTROABS 4.7  --   --   --   --   --   --   HGB 12.0   < > 10.3* 10.3* 10.9* 11.2* 11.7*  HCT 37.4   < > 32.1* 32.2* 33.9* 34.2* 35.2*  MCV 91.4  --  94.1 93.9 93.1 91.9 91.4  PLT 273  --  190 197 212 238 244   < > = values in this interval not displayed.   Basic Metabolic Panel: Recent Labs  Lab 09/27/21 0534 09/28/21 0354 09/29/21 0250 09/30/21 0210 10/01/21 0408  NA 145 141 140 138 137  K 3.2* 3.3* 3.5 3.7 3.7  CL 114* 111 108 107 104  CO2 23 24 24 24 24   GLUCOSE 100* 89 112* 128* 116*  BUN 14 13 12 9 10    CREATININE 0.59 0.69 0.60 0.61 0.57  CALCIUM 8.8* 8.6* 8.8* 9.0 9.3  MG  --  2.2 2.2 2.2  --   PHOS  --  1.9* 2.7 3.2  --    Liver Function Tests: Recent Labs  Lab 09/25/21 1442  AST 20  ALT 16  ALKPHOS 78  BILITOT 0.7  PROT 7.7  ALBUMIN 4.3   CBG: Recent Labs  Lab 09/25/21 1439  GLUCAP 108*    Discharge time spent: 42 MINUTES.   Signed: Hosie Poisson, MD [facility] 10/01/2021

## 2021-10-01 NOTE — H&P (Incomplete)
Physical Medicine and Rehabilitation Admission H&P     HPI: Charlotte Hunter is a 66 year old right-handed female with history of hypertension, recurrent UTIs maintained on prophylactic Macrodantin, aortic insufficiency, PAF with history of ablation maintained on Eliquis.  Her latest cardiology notes that she was planning Xarelto to see if this may be more affordable for her however it was never started and she maintained Eliquis.  Per chart review patient lives alone.  1 level home 4 steps to entry.  Independent prior to admission.  Daughter is planning to stay with her on discharge to provide assistance as needed.  Presented 09/25/2021 with acute onset of left-sided weakness as well as blurred vision.  CT of the head showed hemorrhage centered in the right thalamus and basal ganglia with mild surrounding edema, minimal midline shift, and a small amount of intraventricular extension into the third and fourth ventricle.  Patient was reversed with Andexxa.  Follow-up MRI/MRA unchanged size and morphology of acute intraparenchymal hemorrhage.  No hydrocephalus or trapping.  There was noted focal severe distal left P2 stenosis on MRA.  Admission chemistries unremarkable except potassium 3.2, urinalysis negative.  Echocardiogram with ejection fraction of 65 to 70% no wall motion abnormalities.  She did require short-term intubation through 09/27/2021.  Currently maintained on a mechanical soft nectar thick liquid diet and advanced to thin liquids 10/01/2021.  Therapy evaluations completed due to patient decreased functional mobility left-sided weakness was admitted for a comprehensive rehab program.  Review of Systems  Constitutional:  Negative for chills and fever.  HENT:  Negative for hearing loss.   Eyes:  Positive for blurred vision. Negative for double vision.  Respiratory:  Negative for cough and shortness of breath.   Cardiovascular:  Positive for palpitations. Negative for chest pain and leg  swelling.  Gastrointestinal:  Positive for constipation. Negative for heartburn, nausea and vomiting.  Genitourinary:  Positive for urgency. Negative for dysuria and flank pain.  Musculoskeletal:  Positive for joint pain and myalgias.  Skin:  Negative for rash.  Neurological:  Positive for weakness.  All other systems reviewed and are negative.  Past Medical History:  Diagnosis Date   A-fib [facility])    Aortic insufficiency    Hypertension    *** The histories are not reviewed yet. Please review them in the "History" navigator section and refresh this SmartLink. No family history on file. Social History:  has no history on file for tobacco use, alcohol use, and drug use. Allergies:  Allergies  Allergen Reactions   Cefzil [Cefprozil] Other (See Comments)   Codeine Anaphylaxis and Itching   Ms Contin [Morphine] Hives   Roxicodone [Oxycodone] Hives   Iodinated Contrast Media Diarrhea   Zestril [Lisinopril] Cough   Medications Prior to Admission  Medication Sig Dispense Refill   cetirizine (ZYRTEC) 10 MG tablet Take 10 mg by mouth daily. PRN     estradiol (ESTRACE) 0.5 MG tablet Take 0.5 mg by mouth daily.     metoprolol succinate (TOPROL-XL) 50 MG 24 hr tablet Take 50 mg by mouth daily.     Multiple Vitamin (MULTI-VITAMIN) tablet Take by mouth.     oxymetazoline (AFRIN) 0.05 % nasal spray Place 1 spray into both nostrils. 3-4 times daily     phenazopyridine (PYRIDIUM) 100 MG tablet Take 100 mg by mouth 3 (three) times daily as needed for pain.     progesterone (PROMETRIUM) 100 MG capsule Take 100 mg by mouth daily.     vitamin B-12 (CYANOCOBALAMIN) 500 MCG  tablet Take 2,500 mcg by mouth daily.     losartan (COZAAR) 50 MG tablet Take 50 mg by mouth daily.     nitrofurantoin (MACRODANTIN) 50 MG capsule Take 50 mg by mouth daily. (Patient not taking: Reported on 09/27/2021)     pantoprazole (PROTONIX) 20 MG tablet Take 20 mg by mouth daily. (Patient not taking: Reported on 09/26/2021)      rivaroxaban (XARELTO) 20 MG TABS tablet Take 1 tablet by mouth daily.        Home: Home Living Family/patient expects to be discharged to:: Private residence Living Arrangements: Alone Available Help at Discharge: Family, Available 24 hours/day Type of Home: House Home Access: Stairs to enter [employer] of Steps: 4 Entrance Stairs-Rails: Can reach both Home Layout: One level Bathroom Shower/Tub: Health visitor: Handicapped height Bathroom Accessibility: Yes Home Equipment: None Additional Comments: daughter is planning to stay with pt  Lives With: Alone   Functional History: Prior Function Prior Level of Function : Independent/Modified Independent, Driving Mobility Comments: enjoys gardening  Functional Status:  Mobility: Bed Mobility Overal bed mobility: Needs Assistance Bed Mobility: Supine to Sit Supine to sit: Max assist, HOB elevated, +2 for physical assistance General bed mobility comments: Max assist to support trunk and progress left leg and hips to EOB.  Cues to assist her left arm (not leave it behind) by grabbing the wrist.  initially left push, corrected with cues and change in R hand positioning.  Assist needed to scoot at L hip and cues for safety not to continue to scoot right hip off of the edge of the bed. Transfers Overall transfer level: Needs assistance Equipment used: 2 person hand held assist Transfers: Sit to/from Stand Sit to Stand: Max assist, +2 physical assistance, From elevated surface Bed to/from chair/wheelchair/BSC transfer type:: Stand pivot Stand pivot transfers: Max assist, +2 physical assistance, From elevated surface General transfer comment: Two person max assist to stand from elevated bed and elevated BSC to pivot to pt's right to [facility] and recliner chair.  Cues for right weight shift, significant blocking of left knee and manual movement of L foot to pivot around.  Pt did nto demonstrate any active movement in  left leg with mobility.      ADL: ADL Overall ADL's : Needs assistance/impaired Eating/Feeding: Supervision/ safety, Sitting Eating/Feeding Details (indicate cue type and reason): nectar thick liquids Grooming: Minimal assistance, Sitting Upper Body Bathing: Moderate assistance, Sitting Lower Body Bathing: Maximal assistance, +2 for physical assistance, +2 for safety/equipment, Sit to/from stand Upper Body Dressing : Moderate assistance, Sitting Lower Body Dressing: Maximal assistance, +2 for physical assistance, +2 for safety/equipment, Sit to/from stand Toilet Transfer: Maximal assistance, +2 for physical assistance, +2 for safety/equipment, BSC/3in1, Stand-pivot Toilet Transfer Details (indicate cue type and reason): bed>BSC this session, cues required Toileting- Clothing Manipulation and Hygiene: Maximal assistance, +2 for physical assistance, +2 for safety/equipment, Sit to/from stand Functional mobility during ADLs: Maximal assistance, +2 for physical assistance, +2 for safety/equipment, Cueing for sequencing General ADL Comments: limited by L hemi, L lateral lean, decreased activity tolerance, imapired cognition  Cognition: Cognition Overall Cognitive Status: Impaired/Different from baseline Arousal/Alertness: Awake/alert Orientation Level: Oriented X4 Attention: Sustained Sustained Attention: Impaired Sustained Attention Impairment: Verbal basic Memory: Impaired Memory Impairment: Decreased recall of new information Awareness: Impaired Awareness Impairment: Intellectual impairment Problem Solving: Appears intact (verbal basic) Cognition Arousal/Alertness: Awake/alert Behavior During Therapy: WFL for tasks assessed/performed Overall Cognitive Status: Impaired/Different from baseline Area of Impairment: Attention, Following commands, Safety/judgement, Awareness, Problem  solving Orientation Level: Disoriented to, Time (oriented to month) Current Attention Level:  Sustained Memory: Decreased recall of precautions Following Commands: Follows one step commands consistently, Follows multi-step commands inconsistently Safety/Judgement: Decreased awareness of safety, Decreased awareness of deficits Awareness: Emergent Problem Solving: Difficulty sequencing, Requires verbal cues, Requires tactile cues General Comments: Pt responds well to basic cues, however, is distractable and needs redirection frequently throughout session with PT/OT and daughter in room.  Pt needs cues to attend to left side, but does when cued, asked daughter to sit on her left when able.  Physical Exam: Blood pressure 127/68, pulse 89, temperature 99 F (37.2 C), temperature source Oral, resp. rate 18, height 5\' 6"  (1.676 m), weight 82.1 kg, SpO2 93 %. Physical Exam Neurological:     Comments: Patient is alert.  Makes eye contact with examiner.  Mild left facial droop.  Provides name and age.  Follows simple commands.     Results for orders placed or performed during the hospital encounter of 09/25/21 (from the past 48 hour(s))  CBC     Status: Abnormal   Collection Time: 09/30/21  2:10 AM  Result Value Ref Range   WBC 9.7 4.0 - 10.5 K/uL   RBC 3.72 (L) 3.87 - 5.11 MIL/uL   Hemoglobin 11.2 (L) 12.0 - 15.0 g/dL   HCT 34.2 (L) 36.0 - 46.0 %   MCV 91.9 80.0 - 100.0 fL   MCH 30.1 26.0 - 34.0 pg   MCHC 32.7 30.0 - 36.0 g/dL   RDW 14.5 11.5 - 15.5 %   Platelets 238 150 - 400 K/uL   nRBC 0.0 0.0 - 0.2 %    Comment: Performed at [facility], [place] [address] [place], [place] [id number]  Basic metabolic panel     Status: Abnormal   Collection Time: 09/30/21  2:10 AM  Result Value Ref Range   Sodium 138 135 - 145 mmol/L   Potassium 3.7 3.5 - 5.1 mmol/L   Chloride 107 98 - 111 mmol/L   CO2 24 22 - 32 mmol/L   Glucose, Bld 128 (H) 70 - 99 mg/dL    Comment: Glucose reference range applies only to samples taken after fasting for at least 8 hours.   BUN 9 8 - 23 mg/dL    Creatinine, Ser 0.61 0.44 - 1.00 mg/dL   Calcium 9.0 8.9 - 10.3 mg/dL   GFR, Estimated >60 >60 mL/min    Comment: (NOTE) Calculated using the CKD-EPI Creatinine Equation (2021)    Anion gap 7 5 - 15    Comment: Performed at [facility] [address] [place], [place] [address]  Magnesium     Status: None   Collection Time: 09/30/21  2:10 AM  Result Value Ref Range   Magnesium 2.2 1.7 - 2.4 mg/dL    Comment: Performed at [facility] [address] [place], [place] [address]  Phosphorus     Status: None   Collection Time: 09/30/21  2:10 AM  Result Value Ref Range   Phosphorus 3.2 2.5 - 4.6 mg/dL    Comment: Performed at [facility] [address] [place] [address]  CBC     Status: Abnormal   Collection Time: 10/01/21  4:08 AM  Result Value Ref Range   WBC 8.1 4.0 - 10.5 K/uL   RBC 3.85 (L) 3.87 - 5.11 MIL/uL   Hemoglobin 11.7 (L) 12.0 - 15.0 g/dL   HCT 35.2 (L) 36.0 -  46.0 %   MCV 91.4 80.0 - 100.0 fL   MCH 30.4 26.0 - 34.0 pg   MCHC 33.2 30.0 - 36.0 g/dL   RDW 14.4 11.5 - 15.5 %   Platelets 244 150 - 400 K/uL   nRBC 0.0 0.0 - 0.2 %    Comment: Performed at [facility], [place] [address] [place], [place] [id number]  Basic metabolic panel     Status: Abnormal   Collection Time: 10/01/21  4:08 AM  Result Value Ref Range   Sodium 137 135 - 145 mmol/L   Potassium 3.7 3.5 - 5.1 mmol/L   Chloride 104 98 - 111 mmol/L   CO2 24 22 - 32 mmol/L   Glucose, Bld 116 (H) 70 - 99 mg/dL    Comment: Glucose reference range applies only to samples taken after fasting for at least 8 hours.   BUN 10 8 - 23 mg/dL   Creatinine, Ser 0.57 0.44 - 1.00 mg/dL   Calcium 9.3 8.9 - 10.3 mg/dL   GFR, Estimated >60 >60 mL/min    Comment: (NOTE) Calculated using the CKD-EPI Creatinine Equation (2021)    Anion gap 9 5 - 15    Comment: Performed at [place] [address] [place], [place] [address]   No results found.    Blood pressure 127/68,  pulse 89, temperature 99 F (37.2 C), temperature source Oral, resp. rate 18, height 5\' 6"  (1.676 m), weight 82.1 kg, SpO2 93 %.  Medical Problem List and Plan: 1. 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  Antithrombotics: -DVT/anticoagulation:  Mechanical: Antiembolism stockings, thigh (TED hose) Bilateral lower extremities  -antiplatelet therapy: N/A 3. Pain Management: Tylenol as needed 4. Mood/Sleep: Provide emotional support  -antipsychotic agents: N/A 5. Neuropsych/cognition: This patient is capable of making decisions on her own behalf. 6. Skin/Wound Care: Routine skin checks 7. Fluids/Electrolytes/Nutrition: Routine in and outs with follow-up chemistries 8.  Dysphagia.  Dysphagia #3 thin liquids 9.  Hypertension.  Cozaar 50 mg daily, Lopressor 25 mg twice daily.  Monitor with increased mobility 10.  PAF.  Eliquis on hold due to [place].  Continue Lopressor.  Cardiac rate controlled. 11.  Obesity.  BMI 29.21.  Dietary follow-up 12.  History of recurrent UTI.  Prophylactic Macrodantin 50 mg daily.    Lavon Paganini Arnez Stoneking, PA-C 10/01/2021

## 2021-10-01 NOTE — Progress Notes (Signed)
Physical Therapy Treatment Patient Details Name: Charlotte Hunter MRN: [id number] DOB: 1955-10-30 Today's Date: 10/01/2021   History of Present Illness 66 y.o. female presents to [facility] hospital on 09/25/21 with L sided weakness. 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  Pt required intubation 6/13-6/15. PMH includes HTN, PAF, aortic insufficiency.    PT Comments    Pt continues with L sided flaccidity but reports burning sensation when someone touches her L UE or LE. Pt also with c/p R hip and knee pain from previous injuries that also limited activity tolerance today. Focused on EOB balance and finding and then maintaining midline posture due to L lateral bias. Pt provided tactile cues to pelvis for optimal support and position. Pt worked in standing however limited by onset of fatigue stating "I need to sit. Really just lay down. My legs are going to give out." Acute PT to cont to follow. Continue to recommend AIR upon d/c for maximal functional recovery.    Recommendations for follow up therapy are one component of a multi-disciplinary discharge planning process, led by the attending physician.  Recommendations may be updated based on patient status, additional functional criteria and insurance authorization.  Follow Up Recommendations  Acute inpatient rehab (3hours/day)     Assistance Recommended at Discharge Frequent or constant Supervision/Assistance  Patient can return home with the following Two people to help with walking and/or transfers;Two people to help with bathing/dressing/bathroom;Assistance with cooking/housework;Assistance with feeding;Direct supervision/assist for medications management;Direct supervision/assist for financial management;Assist for transportation;Help with stairs or ramp for entrance   Equipment Recommendations  Wheelchair (measurements PT);Wheelchair cushion (measurements PT);Hospital bed;BSC/3in1    Recommendations for Other Services Rehab  consult     Precautions / Restrictions Precautions Precautions: Fall Precaution Comments: L hemiplegia, L inattention, R hip and knee pain Restrictions Weight Bearing Restrictions: No     Mobility  Bed Mobility Overal bed mobility: Needs Assistance Bed Mobility: Supine to Sit, Sit to Supine     Supine to sit: Max assist, HOB elevated, +2 for physical assistance Sit to supine: Max assist, +2 for physical assistance   General bed mobility comments: Max assist to support trunk and progress left leg and hips to EOB.  Cues to assist her left arm (not leave it behind) by grabbing the wrist.  pt with c/o R LE pain and unable to push up with L UE, maxA for trunk elevation and LE management off EOB and back into bed, pt unable to use L UE functionally    Transfers Overall transfer level: Needs assistance Equipment used: 2 person hand held assist Transfers: Sit to/from Stand Sit to Stand: Max assist, +2 physical assistance, From elevated surface, Mod assist           General transfer comment: attempted 3 musketeer transfer however unable to provide proper support, PT and OT then provided support via face to face and blocked bilat knees, pt able to complete stand with modAx2, worked on lateral weight shifting with tactile cues    Ambulation/Gait               General Gait Details: completed 3 side steps along EOB wtih totalA for L LE management and modA for R LE, max tactile cues for weight shifting   Stairs             Wheelchair Mobility    Modified Rankin (Stroke Patients Only) Modified Rankin (Stroke Patients Only) Pre-Morbid Rankin Score: No symptoms Modified Rankin: Severe disability     Balance Overall  balance assessment: Needs assistance Sitting-balance support: Feet supported, Single extremity supported Sitting balance-Leahy Scale: Poor Sitting balance - Comments: Up to max assist sitting EOB depending on pt's attention to not leaning left and R UE  hand placement. Postural control: Left lateral lean Standing balance support: Single extremity supported Standing balance-Leahy Scale: Poor Standing balance comment: dependent on external support                            Cognition Arousal/Alertness: Awake/alert Behavior During Therapy: WFL for tasks assessed/performed Overall Cognitive Status: Impaired/Different from baseline Area of Impairment: Attention, Following commands, Safety/judgement, Awareness, Problem solving                 Orientation Level:  (oriented to month) Current Attention Level: Sustained Memory: Decreased recall of precautions Following Commands: Follows one step commands consistently, Follows multi-step commands inconsistently Safety/Judgement: Decreased awareness of safety, Decreased awareness of deficits Awareness: Emergent Problem Solving: Difficulty sequencing, Requires verbal cues, Requires tactile cues, Decreased initiation, Slow processing General Comments: pt fatigues quickly delaying response time, pt focused on R hip and knee pain and L UE and LE burning today limiting activity tolerance today. Pt oriented and aware of deficits however demo's L inattention requiring constant cues to tend to L side        Exercises      General Comments General comments (skin integrity, edema, etc.): pT became more lethargic at end of session, BP 125/68, HR 87 at rest, temp 98.4, SpO2 94 bpm,      Pertinent Vitals/Pain Pain Assessment Pain Assessment: Faces Faces Pain Scale: Hurts even more Pain Location: L UE and LE burning to touch, R hip and knee pain Pain Descriptors / Indicators: Grimacing Pain Intervention(s): Monitored during session    Home Living                          Prior Function            PT Goals (current goals can now be found in the care plan section) Acute Rehab PT Goals Patient Stated Goal: get better PT Goal Formulation: With patient Time For Goal  Achievement: 10/11/21 Potential to Achieve Goals: Fair Progress towards PT goals: Progressing toward goals    Frequency    Min 4X/week      PT Plan Current plan remains appropriate    Co-evaluation PT/OT/SLP Co-Evaluation/Treatment: Yes Reason for Co-Treatment: Complexity of the patient's impairments (multi-system involvement) PT goals addressed during session: Mobility/safety with mobility        AM-PAC PT "6 Clicks" Mobility   Outcome Measure  Help needed turning from your back to your side while in a flat bed without using bedrails?: A Lot Help needed moving from lying on your back to sitting on the side of a flat bed without using bedrails?: Total Help needed moving to and from a bed to a chair (including a wheelchair)?: Total Help needed standing up from a chair using your arms (e.g., wheelchair or bedside chair)?: Total Help needed to walk in hospital room?: Total Help needed climbing 3-5 steps with a railing? : Total 6 Click Score: 7    End of Session Equipment Utilized During Treatment: Gait belt Activity Tolerance: Patient limited by pain;Patient limited by fatigue Patient left: with call bell/phone within reach;in bed;with bed alarm set Nurse Communication: Mobility status;Need for lift equipment PT Visit Diagnosis: Other abnormalities of gait and mobility (R26.89);Other  symptoms and signs involving the nervous system (R29.898);Hemiplegia and hemiparesis Hemiplegia - Right/Left: Left Hemiplegia - dominant/non-dominant: Non-dominant Hemiplegia - caused by: Nontraumatic intracerebral hemorrhage     Time: 3888-2800 PT Time Calculation (min) (ACUTE ONLY): 37 min  Charges:  $Neuromuscular Re-education: 8-22 mins                     Lewis Shock, PT, DPT Acute Rehabilitation Services Secure chat preferred Office #: [phone]    Iona Hansen 10/01/2021, 11:42 AM

## 2021-10-01 NOTE — Progress Notes (Signed)
Occupational Therapy Treatment Patient Details Name: Charlotte Hunter MRN: [id number] DOB: 10/18/1955 Today's Date: 10/01/2021   History of present illness 66 y.o. female presents to [facility] hospital on 09/25/21 with L sided weakness. 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  Pt required intubation 6/13-6/15. PMH includes HTN, PAF, aortic insufficiency.   OT comments  This 66 yo female seen today with PT to work on precursors to basic ADLs (bed mobility, sitting balance, standing). Pt willing to work on these tasks with c/o pain in right hip and knee as well as burning sensations in left hand and foot and she fatigued pretty quickly when we started to work on standing. She will continue to benefit from acute OT with follow up on AIR.   Recommendations for follow up therapy are one component of a multi-disciplinary discharge planning process, led by the attending physician.  Recommendations may be updated based on patient status, additional functional criteria and insurance authorization.    Follow Up Recommendations  Acute inpatient rehab (3hours/day)    Assistance Recommended at Discharge Frequent or constant Supervision/Assistance  Patient can return home with the following  A lot of help with walking and/or transfers;A lot of help with bathing/dressing/bathroom;Assistance with cooking/housework;Direct supervision/assist for medications management;Direct supervision/assist for financial management;Assist for transportation;Help with stairs or ramp for entrance   Equipment Recommendations  Other (comment) (TBD next venue)    Recommendations for Other Services Rehab consult    Precautions / Restrictions Precautions Precautions: Fall Precaution Comments: L hemiplegia, L inattention, R hip and knee pain Restrictions Weight Bearing Restrictions: No       Mobility Bed Mobility Overal bed mobility: Needs Assistance Bed Mobility: Supine to Sit, Sit to Supine     Supine to sit:  Max assist, HOB elevated, +2 for physical assistance Sit to supine: Max assist, +2 for physical assistance   General bed mobility comments: Max assist to support trunk and progress left leg and hips to EOB.  Cues to assist her left arm (not leave it behind) by grabbing the wrist.  pt with c/o R LE pain and unable to push up with L UE, maxA for trunk elevation and LE management off EOB and back into bed, pt unable to use L UE functionally    Transfers Overall transfer level: Needs assistance Equipment used: 2 person hand held assist Transfers: Sit to/from Stand Sit to Stand: Max assist, +2 physical assistance, From elevated surface, Mod assist Stand pivot transfers: Max assist, +2 physical assistance, From elevated surface         General transfer comment: attempted 3 musketeer transfer however unable to provide proper support, PT and OT then provided support via face to face and blocked bilat knees, pt able to complete stand with modAx2, worked on lateral weight shifting with tactile cues     Balance Overall balance assessment: Needs assistance Sitting-balance support: Feet supported, Single extremity supported Sitting balance-Leahy Scale: Poor Sitting balance - Comments: Up to max assist sitting EOB depending on pt's attention to not leaning left and R UE hand placement. Postural control: Left lateral lean Standing balance support: Single extremity supported Standing balance-Leahy Scale: Poor Standing balance comment: dependent on external support                           ADL either performed or assessed with clinical judgement   ADL  General ADL Comments: worked on precursors to ADLs today (rolling, coming up to sit from sidelying, sitting balance, and standing balance)    Extremity/Trunk Assessment Upper Extremity Assessment Upper Extremity Assessment: LUE deficits/detail LUE Deficits / Details: PROM WFL, no  trace activation noted. LUE Sensation: decreased light touch;decreased proprioception LUE Coordination: decreased fine motor;decreased gross motor            Vision Baseline Vision/History: 1 Wears glasses Ability to See in Adequate Light: 0 Adequate Additional Comments: No reports of double vision today          Cognition Arousal/Alertness: Awake/alert Behavior During Therapy: WFL for tasks assessed/performed Overall Cognitive Status: Impaired/Different from baseline Area of Impairment: Attention, Following commands, Safety/judgement, Awareness, Problem solving                 Orientation Level:  (oriented to month, day, year) Current Attention Level: Sustained Memory: Decreased recall of precautions Following Commands: Follows one step commands consistently, Follows multi-step commands inconsistently Safety/Judgement: Decreased awareness of safety, Decreased awareness of deficits Awareness: Emergent Problem Solving: Difficulty sequencing, Requires verbal cues, Requires tactile cues, Decreased initiation, Slow processing General Comments: pt fatigues quickly delaying response time, pt focused on R hip and knee pain and L UE and LE burning today limiting activity tolerance today. Pt oriented and aware of deficits however demo's L inattention requiring constant cues to tend to L side              General Comments Pt became more lethargic at end of session, BP  125/68, HR 87 at rest, temp 98.4, SpO2 94--RN made aware    Pertinent Vitals/ Pain       Pain Assessment Pain Assessment: Faces Faces Pain Scale: Hurts even more Pain Location: L UE and LE burning to touch, R hip and knee pain Pain Descriptors / Indicators: Grimacing Pain Intervention(s): Monitored during session, Repositioned         Frequency  Min 2X/week        Progress Toward Goals  OT Goals(current goals can now be found in the care plan section)  Progress towards OT goals: Progressing toward  goals (slowly)  Acute Rehab OT Goals Patient Stated Goal: to rest OT Goal Formulation: With patient Time For Goal Achievement: 10/13/21 Potential to Achieve Goals: Good  Plan Discharge plan remains appropriate;Frequency remains appropriate    Co-evaluation    PT/OT/SLP Co-Evaluation/Treatment: Yes Reason for Co-Treatment: Necessary to address cognition/behavior during functional activity;For patient/therapist safety PT goals addressed during session: Mobility/safety with mobility OT goals addressed during session: ADL's and self-care;Strengthening/ROM      AM-PAC OT "6 Clicks" Daily Activity     Outcome Measure   Help from another person eating meals?: A Little Help from another person taking care of personal grooming?: A Little Help from another person toileting, which includes using toliet, bedpan, or urinal?: A Lot Help from another person bathing (including washing, rinsing, drying)?: A Lot Help from another person to put on and taking off regular upper body clothing?: A Lot Help from another person to put on and taking off regular lower body clothing?: Total 6 Click Score: 13    End of Session Equipment Utilized During Treatment: Gait belt  OT Visit Diagnosis: Unsteadiness on feet (R26.81);Other abnormalities of gait and mobility (R26.89);Muscle weakness (generalized) (M62.81);Pain;Hemiplegia and hemiparesis Hemiplegia - Right/Left: Left Hemiplegia - dominant/non-dominant: Non-Dominant Hemiplegia - caused by: Nontraumatic intracerebral hemorrhage Pain - Right/Left: Right Pain - part of body:  (knee and hip; some burning in  left arm and foot)   Activity Tolerance Patient tolerated treatment well   Patient Left in bed;with call bell/phone within reach;with bed alarm set   Nurse Communication  (more lethargic at end of session --vital normal (in this note))        Time: 2094-7096 OT Time Calculation (min): 37 min  Charges: OT General Charges $OT Visit: 1  Visit OT Treatments $Therapeutic Activity: 8-22 mins  Ignacia Palma, OTR/L Acute Rehab Services [facility] [phone] Office [phone]    Evette Georges 10/01/2021, 12:35 PM

## 2021-10-01 NOTE — TOC Transition Note (Signed)
Transition of Care [facility]) - CM/SW Discharge Note   Patient Details  Name: Ahlaya Ende MRN: [id number] Date of Birth: [birth year]  Transition of [facility]) CM/SW Contact:  Kermit Balo, RN Phone Number: 10/01/2021, 11:57 AM   Clinical Narrative:    Patient is discharging to CIR today. CM signing off.    Final next level of care: IP Rehab Facility Barriers to Discharge: No Barriers Identified   Patient Goals and CMS Choice     Choice offered to / list presented to : Patient  Discharge Placement                       Discharge Plan and Services                                     Social Determinants of Health (SDOH) Interventions     Readmission Risk Interventions     No data to display

## 2021-10-01 NOTE — H&P (Signed)
Physical Medicine and Rehabilitation Admission H&P       HPI: Charlotte Hunter is a 66 year old right-handed female with history of hypertension, recurrent UTIs maintained on prophylactic Macrodantin, aortic insufficiency, PAF with history of ablation maintained on Eliquis.  Her latest cardiology notes that she was planning Xarelto to see if this may be more affordable for her however it was never started and she maintained Eliquis.  Per chart review patient lives alone.  1 level home 4 steps to entry.  Independent prior to admission.  Daughter is planning to stay with her on discharge to provide assistance as needed.  Presented 09/25/2021 with acute onset of left-sided weakness as well as blurred vision.  CT of the head showed hemorrhage centered in the right thalamus and basal ganglia with mild surrounding edema, minimal midline shift, and a small amount of intraventricular extension into the third and fourth ventricle.  Patient was reversed with Andexxa.  Follow-up MRI/MRA unchanged size and morphology of acute intraparenchymal hemorrhage.  No hydrocephalus or trapping.  There was noted focal severe distal left P2 stenosis on MRA.  Admission chemistries unremarkable except potassium 3.2, urinalysis negative.  Echocardiogram with ejection fraction of 65 to 70% no wall motion abnormalities.  She did require short-term intubation through 09/27/2021.  Currently maintained on a mechanical soft nectar thick liquid diet and advanced to thin liquids 10/01/2021.  Therapy evaluations completed due to patient decreased functional mobility left-sided weakness was admitted for a comprehensive rehab program.   Review of Systems  Constitutional:  Negative for chills and fever.  HENT:  Negative for hearing loss.   Eyes:  Positive for blurred vision. Negative for double vision.  Respiratory:  Negative for cough and shortness of breath.   Cardiovascular:  Positive for palpitations. Negative for chest pain and leg  swelling.  Gastrointestinal:  Positive for constipation. Negative for heartburn, nausea and vomiting.  Genitourinary:  Positive for urgency. Negative for dysuria and flank pain.  Musculoskeletal:  Positive for joint pain and myalgias.  Skin:  Negative for rash.  Neurological:  Positive for weakness.  All other systems reviewed and are negative.       Past Medical History:  Diagnosis Date   A-fib [facility])     Aortic insufficiency     Hypertension       The histories are not reviewed yet. Please review them in the "History" navigator section and refresh this SmartLink. No family history on file. Social History:  has no history on file for tobacco use, alcohol use, and drug use. Allergies:      Allergies  Allergen Reactions   Cefzil [Cefprozil] Other (See Comments)   Codeine Anaphylaxis and Itching   Ms Contin [Morphine] Hives   Roxicodone [Oxycodone] Hives   Iodinated Contrast Media Diarrhea   Zestril [Lisinopril] Cough          Medications Prior to Admission  Medication Sig Dispense Refill   cetirizine (ZYRTEC) 10 MG tablet Take 10 mg by mouth daily. PRN       estradiol (ESTRACE) 0.5 MG tablet Take 0.5 mg by mouth daily.       metoprolol succinate (TOPROL-XL) 50 MG 24 hr tablet Take 50 mg by mouth daily.       Multiple Vitamin (MULTI-VITAMIN) tablet Take by mouth.       oxymetazoline (AFRIN) 0.05 % nasal spray Place 1 spray into both nostrils. 3-4 times daily       phenazopyridine (PYRIDIUM) 100 MG tablet Take 100 mg by mouth 3 (  three) times daily as needed for pain.       progesterone (PROMETRIUM) 100 MG capsule Take 100 mg by mouth daily.       vitamin B-12 (CYANOCOBALAMIN) 500 MCG tablet Take 2,500 mcg by mouth daily.       losartan (COZAAR) 50 MG tablet Take 50 mg by mouth daily.       nitrofurantoin (MACRODANTIN) 50 MG capsule Take 50 mg by mouth daily. (Patient not taking: Reported on 09/27/2021)       pantoprazole (PROTONIX) 20 MG tablet Take 20 mg by mouth daily. (Patient  not taking: Reported on 09/26/2021)       rivaroxaban (XARELTO) 20 MG TABS tablet Take 1 tablet by mouth daily.              Home: Home Living Family/patient expects to be discharged to:: Private residence Living Arrangements: Alone Available Help at Discharge: Family, Available 24 hours/day Type of Home: House Home Access: Stairs to enter [employer] of Steps: 4 Entrance Stairs-Rails: Can reach both Home Layout: One level Bathroom Shower/Tub: Multimedia programmer: Handicapped height Bathroom Accessibility: Yes Home Equipment: None Additional Comments: daughter is planning to stay with pt  Lives With: Alone   Functional History: Prior Function Prior Level of Function : Independent/Modified Independent, Driving Mobility Comments: enjoys gardening   Functional Status:  Mobility: Bed Mobility Overal bed mobility: Needs Assistance Bed Mobility: Supine to Sit Supine to sit: Max assist, HOB elevated, +2 for physical assistance General bed mobility comments: Max assist to support trunk and progress left leg and hips to EOB.  Cues to assist her left arm (not leave it behind) by grabbing the wrist.  initially left push, corrected with cues and change in R hand positioning.  Assist needed to scoot at L hip and cues for safety not to continue to scoot right hip off of the edge of the bed. Transfers Overall transfer level: Needs assistance Equipment used: 2 person hand held assist Transfers: Sit to/from Stand Sit to Stand: Max assist, +2 physical assistance, From elevated surface Bed to/from chair/wheelchair/BSC transfer type:: Stand pivot Stand pivot transfers: Max assist, +2 physical assistance, From elevated surface General transfer comment: Two person max assist to stand from elevated bed and elevated BSC to pivot to pt's right to [facility] and recliner chair.  Cues for right weight shift, significant blocking of left knee and manual movement of L foot to pivot around.   Pt did nto demonstrate any active movement in left leg with mobility.   ADL: ADL Overall ADL's : Needs assistance/impaired Eating/Feeding: Supervision/ safety, Sitting Eating/Feeding Details (indicate cue type and reason): nectar thick liquids Grooming: Minimal assistance, Sitting Upper Body Bathing: Moderate assistance, Sitting Lower Body Bathing: Maximal assistance, +2 for physical assistance, +2 for safety/equipment, Sit to/from stand Upper Body Dressing : Moderate assistance, Sitting Lower Body Dressing: Maximal assistance, +2 for physical assistance, +2 for safety/equipment, Sit to/from stand Toilet Transfer: Maximal assistance, +2 for physical assistance, +2 for safety/equipment, BSC/3in1, Stand-pivot Toilet Transfer Details (indicate cue type and reason): bed>BSC this session, cues required Toileting- Clothing Manipulation and Hygiene: Maximal assistance, +2 for physical assistance, +2 for safety/equipment, Sit to/from stand Functional mobility during ADLs: Maximal assistance, +2 for physical assistance, +2 for safety/equipment, Cueing for sequencing General ADL Comments: limited by L hemi, L lateral lean, decreased activity tolerance, imapired cognition   Cognition: Cognition Overall Cognitive Status: Impaired/Different from baseline Arousal/Alertness: Awake/alert Orientation Level: Oriented X4 Attention: Sustained Sustained Attention: Impaired Sustained Attention Impairment:  Verbal basic Memory: Impaired Memory Impairment: Decreased recall of new information Awareness: Impaired Awareness Impairment: Intellectual impairment Problem Solving: Appears intact (verbal basic) Cognition Arousal/Alertness: Awake/alert Behavior During Therapy: WFL for tasks assessed/performed Overall Cognitive Status: Impaired/Different from baseline Area of Impairment: Attention, Following commands, Safety/judgement, Awareness, Problem solving Orientation Level: Disoriented to, Time (oriented to  month) Current Attention Level: Sustained Memory: Decreased recall of precautions Following Commands: Follows one step commands consistently, Follows multi-step commands inconsistently Safety/Judgement: Decreased awareness of safety, Decreased awareness of deficits Awareness: Emergent Problem Solving: Difficulty sequencing, Requires verbal cues, Requires tactile cues General Comments: Pt responds well to basic cues, however, is distractable and needs redirection frequently throughout session with PT/OT and daughter in room.  Pt needs cues to attend to left side, but does when cued, asked daughter to sit on her left when able.   Physical Exam: Blood pressure 127/68, pulse 89, temperature 99 F (37.2 C), temperature source Oral, resp. rate 18, height 5\' 6"  (1.676 m), weight 82.1 kg, SpO2 93 %. Physical Exam Neurological:     Comments: Patient is alert.  Makes eye contact with examiner.  Mild left facial droop.  Provides name and age.  Follows simple commands.    General: No acute distress Mood and affect are appropriate Heart: Regular rate and rhythm no rubs murmurs or extra sounds Lungs: Clear to auscultation, breathing unlabored, no rales or wheezes Abdomen: Positive bowel sounds, soft nontender to palpation, nondistended Extremities: No clubbing, cyanosis, or edema Skin: No evidence of breakdown, no evidence of rash Neurologic: Cranial nerves II through XII intact, motor strength is 5/5 in Right and 0/5 Left deltoid, bicep, tricep, grip, hip flexor, knee extensors, ankle dorsiflexor and plantar flexor Sensory exam normal sensation to light touch in RIght and reduced left upper and lower extremities  Musculoskeletal: Full passive range of motion in all 4 extremities. No joint swelling      Lab Results Last 48 Hours        Results for orders placed or performed during the hospital encounter of 09/25/21 (from the past 48 hour(s))  CBC     Status: Abnormal    Collection Time: 09/30/21   2:10 AM  Result Value Ref Range    WBC 9.7 4.0 - 10.5 K/uL    RBC 3.72 (L) 3.87 - 5.11 MIL/uL    Hemoglobin 11.2 (L) 12.0 - 15.0 g/dL    HCT 34.2 (L) 36.0 - 46.0 %    MCV 91.9 80.0 - 100.0 fL    MCH 30.1 26.0 - 34.0 pg    MCHC 32.7 30.0 - 36.0 g/dL    RDW 14.5 11.5 - 15.5 %    Platelets 238 150 - 400 K/uL    nRBC 0.0 0.0 - 0.2 %      Comment: Performed at [facility], [place] [address] [place], [place] [id number]  Basic metabolic panel     Status: Abnormal    Collection Time: 09/30/21  2:10 AM  Result Value Ref Range    Sodium 138 135 - 145 mmol/L    Potassium 3.7 3.5 - 5.1 mmol/L    Chloride 107 98 - 111 mmol/L    CO2 24 22 - 32 mmol/L    Glucose, Bld 128 (H) 70 - 99 mg/dL      Comment: Glucose reference range applies only to samples taken after fasting for at least 8 hours.    BUN 9 8 - 23 mg/dL    Creatinine, Ser 0.61 0.44 -  1.00 mg/dL    Calcium 9.0 8.9 - 10.3 mg/dL    GFR, Estimated >60 >60 mL/min      Comment: (NOTE) Calculated using the CKD-EPI Creatinine Equation (2021)      Anion gap 7 5 - 15      Comment: Performed at [facility], [place] [address] [place], [place] [address]  Magnesium     Status: None    Collection Time: 09/30/21  2:10 AM  Result Value Ref Range    Magnesium 2.2 1.7 - 2.4 mg/dL      Comment: Performed at [facility] [address], [place], [place] [address]  Phosphorus     Status: None    Collection Time: 09/30/21  2:10 AM  Result Value Ref Range    Phosphorus 3.2 2.5 - 4.6 mg/dL      Comment: Performed at [facility] [address], [place], [place] [address]  CBC     Status: Abnormal    Collection Time: 10/01/21  4:08 AM  Result Value Ref Range    WBC 8.1 4.0 - 10.5 K/uL    RBC 3.85 (L) 3.87 - 5.11 MIL/uL    Hemoglobin 11.7 (L) 12.0 - 15.0 g/dL    HCT 35.2 (L) 36.0 - 46.0 %    MCV 91.4 80.0 - 100.0 fL    MCH 30.4 26.0 - 34.0 pg    MCHC 33.2 30.0 - 36.0 g/dL    RDW 14.4 11.5 - 15.5 %    Platelets 244 150 - 400  K/uL    nRBC 0.0 0.0 - 0.2 %      Comment: Performed at [facility], [place] [address], [place], [place] [id number]  Basic metabolic panel     Status: Abnormal    Collection Time: 10/01/21  4:08 AM  Result Value Ref Range    Sodium 137 135 - 145 mmol/L    Potassium 3.7 3.5 - 5.1 mmol/L    Chloride 104 98 - 111 mmol/L    CO2 24 22 - 32 mmol/L    Glucose, Bld 116 (H) 70 - 99 mg/dL      Comment: Glucose reference range applies only to samples taken after fasting for at least 8 hours.    BUN 10 8 - 23 mg/dL    Creatinine, Ser 0.57 0.44 - 1.00 mg/dL    Calcium 9.3 8.9 - 10.3 mg/dL    GFR, Estimated >60 >60 mL/min      Comment: (NOTE) Calculated using the CKD-EPI Creatinine Equation (2021)      Anion gap 9 5 - 15      Comment: Performed at [facility] [address] [place], [place] [address]      Imaging Results (Last 48 hours)  No results found.         Blood pressure 127/68, pulse 89, temperature 99 F (37.2 C), temperature source Oral, resp. rate 18, height 5\' 6"  (1.676 m), weight 82.1 kg, SpO2 93 %.   Medical Problem List and Plan: 1. Functional deficits secondary to right thalamic ICH with small IVH likely secondary to hypertension and Eliquis Repeat CT head in 2 wks to assess bleed.  Have neuro review to see if Xarelto can be started              -patient may  shower             -ELOS/Goals: 16-18d Sup/minA 2.  Antithrombotics: -DVT/anticoagulation:  Mechanical: Antiembolism stockings, thigh (TED hose) Bilateral lower extremities             -antiplatelet therapy: N/A 3. Pain Management: Tylenol as needed, has burning discomfort on Left side  4. Mood/Sleep: Provide emotional support             -antipsychotic agents: N/A 5. Neuropsych/cognition: This patient is capable of making decisions on her own behalf. 6. Skin/Wound Care: Routine skin checks 7. Fluids/Electrolytes/Nutrition: Routine in and outs with follow-up chemistries 8.  Dysphagia.  Dysphagia #3  thin liquids 9.  Hypertension.  Cozaar 50 mg daily, Lopressor 25 mg twice daily.  Monitor with increased mobility There were no vitals filed for this visit.  10.  PAF.  Eliquis on hold due to ICH.  Continue Lopressor.  Cardiac rate controlled. 11.  Obesity.  BMI 29.21.  Dietary follow-up 12.  History of recurrent UTI.  Prophylactic Macrodantin 50 mg daily.  13.  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 [facility] Fellow Am [employer] of [employer] Med and Rehab Diplomate Am Board of Electrodiagnostic Med Fellow Am Board of Interventional Pain

## 2021-10-01 NOTE — Progress Notes (Signed)
Speech Language Pathology Treatment: Dysphagia;Cognitive-Linquistic  Patient Details Name: Charlotte Hunter MRN: [id number] DOB: 30-Sep-1955 Today's Date: 10/01/2021 Time: 4034-7425 SLP Time Calculation (min) (ACUTE ONLY): 24 min  Assessment / Plan / Recommendation Clinical Impression  Pt alert, and upright, vocal quality soft but clear. Pt and family have requested repeat assessment given that when MBS was completed pt had been recently extubated and mentation has since improved. SLP reviewed MBS images which showed mild aspiration with consecutive sips and only slight timing impairment, strength WNL. Today pt able to consistently take single small sips of water throughout meal, no signs of aspiration. Pts cognition appropriate to learn and carry over sew strategies such as emphasis on labial seal, single sips and visual sweep to left to locate edge of table to find items on tray. Pt may upgrade to thin liquids given low risk of detrimental aspiration at this time. Will continue to work with pt while admitted.    HPI HPI: 66 y.o. female presents to [facility] hospital on [facility]/2021 with L weakness. 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  Pt required intubation 6/13-6/15. PMH includes HTN, PAF, GERD, UTI.      SLP Plan  Continue with current plan of care      Recommendations for follow up therapy are one component of a multi-disciplinary discharge planning process, led by the attending physician.  Recommendations may be updated based on patient status, additional functional criteria and insurance authorization.    Recommendations  Diet recommendations: Dysphagia 3 (mechanical soft);Thin liquid Liquids provided via: Cup;Straw Medication Administration: Via alternative means Supervision: Patient able to self feed Compensations: Minimize environmental distractions;Slow rate;Small sips/bites Postural Changes and/or Swallow Maneuvers: Seated upright 90 degrees                Oral  Care Recommendations: Oral care before and after PO Follow Up Recommendations: Acute inpatient rehab (3hours/day) Assistance recommended at discharge: Frequent or constant Supervision/Assistance SLP Visit Diagnosis: Dysphagia, oropharyngeal phase (R13.12) Plan: Continue with current plan of care           Liyat Faulkenberry, Riley Nearing  10/01/2021, 10:47 AM

## 2021-10-01 NOTE — Progress Notes (Signed)
Signed     PMR Admission Coordinator Pre-Admission Assessment   Patient: Charlotte Hunter is an 66 y.o., female MRN: [id number] DOB: [birth year] Height: $RemoveBeforeDE'5\' 6"'[id number]$  (167.6 cm) Weight: 82.1 kg   Insurance Information HMO:     PPO:      PCP:      IPA:      80/20: yes     OTHER:  PRIMARY: Medicare A & B      Policy#: [id number]      Subscriber: patient CM Name:       Phone#:      Fax#:  Pre-Cert#:       Employer:  Benefits:  Phone #: verified eligibility via [place] on 09/29/21     Name:  Eff. Date: Part A effective 10/13/16, Part B effective 11/13/17     Deduct: $1,600      Out of Pocket Max: NA      Life Max: NA CIR: 100% coverage      SNF: 100% coverage days 1-20, 80% coverage days 21-100 Outpatient: 80% coverage     Co-Pay: 20% Home Health: 100% coverage      Co-Pay:  DME: 80% coverage     Co-Pay: 20% Providers: pt's choice SECONDARY:       Policy#:      Phone#:    Development worker, community:       Phone#:    The Therapist, art Information Summary" for patients in Inpatient Rehabilitation Facilities with attached "Privacy Act [place] Records" was provided and verbally reviewed with: Patient   Emergency Contact Information Contact Information       Name Relation Home Work Mobile    [place] Daughter [phone]               Current Medical History  Patient Admitting Diagnosis: Hemorrhagic stroke History of Present Illness: Pt is a 66 year old female with medical hx significant for: HTN, PAF, GERD, UTI. Pt [facility] on 09/25[facility]21 d/t left side weakness, slurred speech, nausea with headache and visual disturbances. Code stroke activated. CT head showed hemorrhage in right thalamus and basal ganglia with mild surrounding edema, minimal midline shift, small amount of intraventricular extension into third and fourth ventricles. Pt intubated on 6/13. Pt transferred to [facility]. MRI showed stable hematoma and IVH, mildly increase mass  effect, no CAA. MRA showed no AVM or aneurysm. 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 Echo showed EF 65-70%. Pt extubated on 6/15. Therapy evaluations completed and CIR recommended d/t pt's deficits in functional mobility and inability to complete ADLs independently. Complete NIHSS TOTAL: 14   Patient's medical record from [facility] has been reviewed by the rehabilitation admission coordinator and physician.   Past Medical History      Past Medical History:  Diagnosis Date   A-fib [facility])     Aortic insufficiency     Hypertension        Has the patient had major surgery during 100 days prior to admission? No   Family History   family history is not on file.   Current Medications   Current Facility-Administered Medications:    acetaminophen (TYLENOL) tablet 650 mg, 650 mg, Oral, Q4H PRN, 650 mg at 09/30/21 1453 **OR** acetaminophen (TYLENOL) 160 MG/5ML solution 650 mg, 650 mg, Per Tube, Q4H PRN, 650 mg at 09/26/21 1949 **OR** acetaminophen (TYLENOL) suppository 650 mg, 650 mg, Rectal, Q4H PRN, Omar Person, NP, 650 mg at 09/27/21 1746   Chlorhexidine Gluconate  Cloth 2 % PADS 6 each, 6 each, Topical, Daily, Omar Person, NP, 6 each at 10/01/21 0947   hydrALAZINE (APRESOLINE) injection 10 mg, 10 mg, Intravenous, Q4H PRN, Eubanks, Katalina M, NP   losartan (COZAAR) tablet 50 mg, 50 mg, Oral, Daily, Eubanks, Katalina M, NP, 50 mg at 10/01/21 0819   melatonin tablet 3 mg, 3 mg, Oral, QHS, [place], Bing Neighbors, NP, 3 mg at 09/30/21 2108   metoprolol tartrate (LOPRESSOR) tablet 25 mg, 25 mg, Oral, BID, Eubanks, Katalina M, NP, 25 mg at 10/01/21 3474   multivitamin with minerals tablet 1 tablet, 1 tablet, Oral, Daily, Beulah Gandy A, NP, 1 tablet at 10/01/21 0819   Muscle Rub CREA, , Topical, PRN, Omar Person, NP, Given at 09/27/21 2102   nitrofurantoin (MACRODANTIN) capsule 50 mg, 50 mg, Oral, Daily, Gonfa, Taye T, MD, 50 mg at 10/01/21 0819    ondansetron (ZOFRAN) injection 4 mg, 4 mg, Intravenous, Q6H PRN, Omar Person, NP, 4 mg at 09/28/21 1145   Oral care mouth rinse, 15 mL, Mouth Rinse, 4 times per day, Hayden Pedro M, NP, 15 mL at 10/01/21 1144   Oral care mouth rinse, 15 mL, Mouth Rinse, PRN, Dewaine Oats, Katalina M, NP   pantoprazole (PROTONIX) EC tablet 40 mg, 40 mg, Oral, QHS, Eubanks, Katalina M, NP, 40 mg at 09/30/21 2107   polyethylene glycol (MIRALAX / GLYCOLAX) packet 17 g, 17 g, Oral, BID, Gonfa, Taye T, MD, 17 g at 09/30/21 2108   polyvinyl alcohol (LIQUIFILM TEARS) 1.4 % ophthalmic solution 1 drop, 1 drop, Both Eyes, PRN, Omar Person, NP, 1 drop at 10/01/21 2595   rosuvastatin (CRESTOR) tablet 20 mg, 20 mg, Oral, Daily, Rosalin Hawking, MD, 20 mg at 10/01/21 6387   senna-docusate (Senokot-S) tablet 1 tablet, 1 tablet, Oral, BID, Omar Person, NP, 1 tablet at 10/01/21 5643   Patients Current Diet:  Diet Order                  DIET DYS 3 Room service appropriate? Yes with Assist; Fluid consistency: Thin  Diet effective now                         Precautions / Restrictions Precautions Precautions: Fall Precaution Comments: L hemiplegia, L inattention, R hip and knee pain Restrictions Weight Bearing Restrictions: No    Has the patient had 2 or more falls or a fall with injury in the past year? No   Prior Activity Level Community (5-7x/wk): gets out of house 3-4 days/week; drives   Prior Functional Level Self Care: Did the patient need help bathing, dressing, using the toilet or eating? Independent   Indoor Mobility: Did the patient need assistance with walking from room to room (with or without device)? Independent   Stairs: Did the patient need assistance with internal or external stairs (with or without device)? Independent   Functional Cognition: Did the patient need help planning regular tasks such as shopping or remembering to take medications? Independent   Patient  Information Are you of Hispanic, Latino/a,or Spanish origin?: A. No, not of Hispanic, Latino/a, or Spanish origin What is your race?: A. White Do you need or want an interpreter to communicate with a doctor or health care staff?: 0. No   Patient's Response To:  Health Literacy and Transportation Is the patient able to respond to health literacy and transportation needs?: Yes Health Literacy - How often do you need to have  someone help you when you read instructions, pamphlets, or other written material from your doctor or pharmacy?: Never In the past 12 months, has lack of transportation kept you from medical appointments or from getting medications?: No In the past 12 months, has lack of transportation kept you from meetings, work, or from getting things needed for daily living?: No   Home Assistive Devices / Equipment Home Equipment: None   Prior Device Use: Indicate devices/aids used by the patient prior to current illness, exacerbation or injury? None of the above   Current Functional Level Cognition   Arousal/Alertness: Awake/alert Overall Cognitive Status: Impaired/Different from baseline Current Attention Level: Sustained Orientation Level: Oriented X4 Following Commands: Follows one step commands consistently, Follows multi-step commands inconsistently Safety/Judgement: Decreased awareness of safety, Decreased awareness of deficits General Comments: pt fatigues quickly delaying response time, pt focused on R hip and knee pain and L UE and LE burning today limiting activity tolerance today. Pt oriented and aware of deficits however demo's L inattention requiring constant cues to tend to L side Attention: Sustained Sustained Attention: Impaired Sustained Attention Impairment: Verbal basic Memory: Impaired Memory Impairment: Decreased recall of new information Awareness: Impaired Awareness Impairment: Intellectual impairment Problem Solving: Appears intact (verbal basic)     Extremity Assessment (includes Sensation/Coordination)   Upper Extremity Assessment: LUE deficits/detail LUE Deficits / Details: PROM WFL, no trace activation noted. LUE: Subluxation noted LUE Sensation: decreased light touch, decreased proprioception LUE Coordination: decreased fine motor, decreased gross motor  Lower Extremity Assessment: Defer to PT evaluation LLE Deficits / Details: PROM WFL, flaccid     ADLs   Overall ADL's : Needs assistance/impaired Eating/Feeding: Supervision/ safety, Sitting Eating/Feeding Details (indicate cue type and reason): nectar thick liquids Grooming: Minimal assistance, Sitting Upper Body Bathing: Moderate assistance, Sitting Lower Body Bathing: Maximal assistance, +2 for physical assistance, +2 for safety/equipment, Sit to/from stand Upper Body Dressing : Moderate assistance, Sitting Lower Body Dressing: Maximal assistance, +2 for physical assistance, +2 for safety/equipment, Sit to/from stand Toilet Transfer: Maximal assistance, +2 for physical assistance, +2 for safety/equipment, BSC/3in1, Stand-pivot Toilet Transfer Details (indicate cue type and reason): bed>BSC this session, cues required Toileting- Clothing Manipulation and Hygiene: Maximal assistance, +2 for physical assistance, +2 for safety/equipment, Sit to/from stand Functional mobility during ADLs: Maximal assistance, +2 for physical assistance, +2 for safety/equipment, Cueing for sequencing General ADL Comments: limited by L hemi, L lateral lean, decreased activity tolerance, imapired cognition     Mobility   Overal bed mobility: Needs Assistance Bed Mobility: Supine to Sit, Sit to Supine Supine to sit: Max assist, HOB elevated, +2 for physical assistance Sit to supine: Max assist, +2 for physical assistance General bed mobility comments: Max assist to support trunk and progress left leg and hips to EOB.  Cues to assist her left arm (not leave it behind) by grabbing the wrist.  pt with  c/o R LE pain and unable to push up with L UE, maxA for trunk elevation and LE management off EOB and back into bed, pt unable to use L UE functionally     Transfers   Overall transfer level: Needs assistance Equipment used: 2 person hand held assist Transfers: Sit to/from Stand Sit to Stand: Max assist, +2 physical assistance, From elevated surface, Mod assist Bed to/from chair/wheelchair/BSC transfer type:: Stand pivot Stand pivot transfers: Max assist, +2 physical assistance, From elevated surface General transfer comment: attempted 3 musketeer transfer however unable to provide proper support, PT and OT then provided support via face  to face and blocked bilat knees, pt able to complete stand with modAx2, worked on lateral weight shifting with tactile cues     Ambulation / Gait / Stairs / Wheelchair Mobility   Ambulation/Gait General Gait Details: completed 3 side steps along EOB wtih totalA for L LE management and modA for R LE, max tactile cues for weight shifting     Posture / Balance Dynamic Sitting Balance Sitting balance - Comments: Up to max assist sitting EOB depending on pt's attention to not leaning left and R UE hand placement. Balance Overall balance assessment: Needs assistance Sitting-balance support: Feet supported, Single extremity supported Sitting balance-Leahy Scale: Poor Sitting balance - Comments: Up to max assist sitting EOB depending on pt's attention to not leaning left and R UE hand placement. Postural control: Left lateral lean Standing balance support: Single extremity supported Standing balance-Leahy Scale: Poor Standing balance comment: dependent on external support     Special needs/care consideration Continuous Drip IV  0.9% sodium chloride infusion, Bladder incontinence, External urinary catheter    Previous Home Environment (from acute therapy documentation) Living Arrangements: Alone  Lives With: Alone Available Help at Discharge: Family, Available  24 hours/day Type of Home: House Home Layout: One level Home Access: Stairs to enter Entrance Stairs-Rails: Can reach both Entrance Stairs-Number of Steps: 4 Bathroom Shower/Tub: Multimedia programmer: Handicapped height Bathroom Accessibility: Yes How Accessible: Accessible via walker Home Care Services: No Additional Comments: daughter is planning to stay with pt   Discharge Living Setting Plans for Discharge Living Setting: Patient's home Type of Home at Discharge: House Discharge Home Layout: One level Discharge Home Access: Stairs to enter Entrance Stairs-Rails: Can reach both Entrance Stairs-Number of Steps: 4 Discharge Bathroom Shower/Tub: Walk-in shower Discharge Bathroom Toilet: Handicapped height Discharge Bathroom Accessibility: Yes How Accessible: Accessible via walker Does the patient have any problems obtaining your medications?: Yes (Describe) (some medication is expensive)   Social/Family/Support Systems Anticipated Caregiver: Dajai Wahlert, daughter Anticipated Caregiver's Contact Information: [phone] Caregiver Availability: Intermittent Discharge Plan Discussed with Primary Caregiver: Yes Is Caregiver In Agreement with Plan?: Yes Does Caregiver/Family have Issues with Lodging/Transportation while Pt is in Rehab?: No   Goals Patient/Family Goal for Rehab: Supervision-Min A: PT/OT, Mod I: ST Expected length of stay: 16-18 days Pt/Family Agrees to Admission and willing to participate: Yes Program Orientation Provided & Reviewed with Pt/Caregiver Including Roles  & Responsibilities: Yes   Decrease burden of Care through IP rehab admission: NA   Possible need for SNF placement upon discharge: Not anticipated   Patient Condition: I have reviewed medical records from [facility], spoken with CM, and patient and daughter. I met with patient at the bedside and discussed via phone for inpatient rehabilitation assessment.  Patient will benefit  from ongoing PT, OT, and SLP, can actively participate in 3 hours of therapy a day 5 days of the week, and can make measurable gains during the admission.  Patient will also benefit from the coordinated team approach during an Inpatient Acute Rehabilitation admission.  The patient will receive intensive therapy as well as Rehabilitation physician, nursing, social worker, and care management interventions.  Due to bladder management, safety, disease management, medication administration, pain management, and patient education the patient requires 24 hour a day rehabilitation nursing.  The patient is currently Max A +2 with mobility and Mod-Max A +2 with basic ADLs.  Discharge setting and therapy post discharge at home with home health is anticipated.  Patient has agreed to participate in  the Acute Inpatient Rehabilitation Program and will admit today.   Preadmission Screen Completed By:  Bethel Born, 10/01/2021 11:48 AM ______________________________________________________________________   Discussed status with Dr. Letta Pate on 10/01/21 at 11:49 AM and received approval for admission today.   Admission Coordinator:  Bethel Born, CCC-SLP, time 11:49 AM/Date 10/01/21     Assessment/Plan: Diagnosis:  RIght Thalamic infarct Does the need for close, 24 hr/day Medical supervision in concert with the patient's rehab needs make it unreasonable for this patient to be served in a less intensive setting? Yes Co-Morbidities requiring supervision/potential complications: HTN, PAF Eliquis on hold Due to bladder management, bowel management, safety, skin/wound care, disease management, medication administration, pain management, and patient education, does the patient require 24 hr/day rehab nursing? Yes Does the patient require coordinated care of a physician, rehab nurse, PT, OT, and SLP to address physical and functional deficits in the context of the above medical diagnosis(es)?  Yes Addressing deficits in the following areas: balance, endurance, locomotion, strength, transferring, bowel/bladder control, bathing, dressing, feeding, grooming, toileting, and psychosocial support Can the patient actively participate in an intensive therapy program of at least 3 hrs of therapy 5 days a week? Yes The potential for patient to make measurable gains while on inpatient rehab is good Anticipated functional outcomes upon discharge from inpatient rehab: supervision and min assist PT, supervision and min assist OT, modified independent SLP Estimated rehab length of stay to reach the above functional goals is: 16-18d Anticipated discharge destination: Home 10. Overall Rehab/Functional Prognosis: good     MD Signature: Charlett Blake M.D. [facility] Fellow Am Acad of Phys Med and Rehab Diplomate Am Board of Electrodiagnostic Med Fellow Am Board of Interventional Pain

## 2021-10-01 NOTE — Care Management Important Message (Signed)
Important Message  Patient Details  Name: Charlotte Hunter MRN: [id number] Date of Birth: 1955-08-15   Medicare Important Message Given:  Yes     Mardene Sayer 10/01/2021, 2:46 PM

## 2021-10-01 NOTE — Progress Notes (Signed)
Inpatient Rehab Admissions Coordinator:  There is a bed available for pt in CIR today. Dr. Blake Divine is aware and in agreement. Pt, pt's daughter Tera, NSG, and TOC aware.   Wolfgang Phoenix, MS, CCC-SLP Admissions Coordinator [phone]

## 2021-10-01 NOTE — Progress Notes (Addendum)
Pt arrived to unit via bed, pt alert, pt was supposed to bladder scanned and cath'd if needed prior to arriving to unit. Nurse notified me that me that did have incontinent episode but didn't bladder scan. Will bladder scan. Pt oriented to rehab.

## 2021-10-02 ENCOUNTER — Inpatient Hospital Stay (HOSPITAL_COMMUNITY): Payer: Medicare Other

## 2021-10-02 LAB — COMPREHENSIVE METABOLIC PANEL
ALT: 22 U/L (ref 0–44)
AST: 20 U/L (ref 15–41)
Albumin: 3.1 g/dL — ABNORMAL LOW (ref 3.5–5.0)
Alkaline Phosphatase: 85 U/L (ref 38–126)
Anion gap: 10 (ref 5–15)
BUN: 14 mg/dL (ref 8–23)
CO2: 26 mmol/L (ref 22–32)
Calcium: 9.5 mg/dL (ref 8.9–10.3)
Chloride: 104 mmol/L (ref 98–111)
Creatinine, Ser: 0.63 mg/dL (ref 0.44–1.00)
GFR, Estimated: >60 mL/min (ref >60)
Glucose, Bld: 117 mg/dL — ABNORMAL HIGH (ref 70–99)
Potassium: 3.8 mmol/L (ref 3.5–5.1)
Sodium: 140 mmol/L (ref 135–145)
Total Bilirubin: 0.5 mg/dL (ref 0.3–1.2)
Total Protein: 6.5 g/dL (ref 6.5–8.1)

## 2021-10-02 LAB — CBC WITH DIFFERENTIAL/PLATELET
Abs Immature Granulocytes: 0.03 10*3/uL (ref 0.00–0.07)
Basophils Absolute: 0.1 10*3/uL (ref 0.0–0.1)
Basophils Relative: 1 %
Eosinophils Absolute: 0.3 10*3/uL (ref 0.0–0.5)
Eosinophils Relative: 4 %
HCT: 36.2 % (ref 36.0–46.0)
Hemoglobin: 11.9 g/dL — ABNORMAL LOW (ref 12.0–15.0)
Immature Granulocytes: 0 %
Lymphocytes Relative: 22 %
Lymphs Abs: 1.7 10*3/uL (ref 0.7–4.0)
MCH: 29.9 pg (ref 26.0–34.0)
MCHC: 32.9 g/dL (ref 30.0–36.0)
MCV: 91 fL (ref 80.0–100.0)
Monocytes Absolute: 0.5 10*3/uL (ref 0.1–1.0)
Monocytes Relative: 6 %
Neutro Abs: 5.1 10*3/uL (ref 1.7–7.7)
Neutrophils Relative %: 67 %
Platelets: 268 10*3/uL (ref 150–400)
RBC: 3.98 MIL/uL (ref 3.87–5.11)
RDW: 14.4 % (ref 11.5–15.5)
WBC: 7.7 10*3/uL (ref 4.0–10.5)
nRBC: 0 % (ref 0.0–0.2)

## 2021-10-02 IMAGING — CT CT HEAD W/O CM
4 series · 16 of 47 positions shown, 18 images · non-contrast
Comparison: Head CT [DATE].  MRI brain [DATE].

CLINICAL DATA: Provided history: Subdural hematoma, MOATSHE.



[Series 3: head without · axial · non-contrast · 0.43mm/px · z∈[-117,+3]mm · 7 of 33 slices shown, 9 images]
[im 5/33  brain]
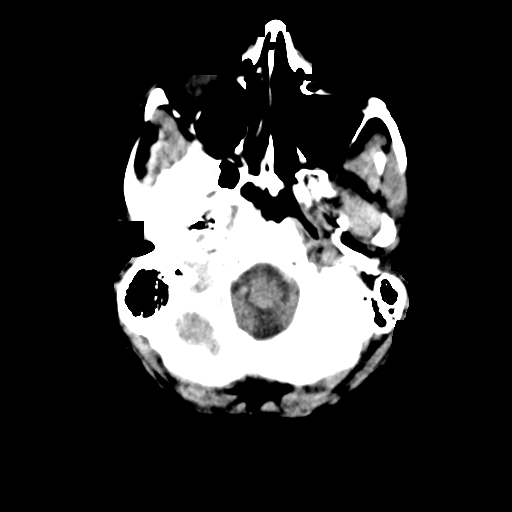
[im 5/33  bone]
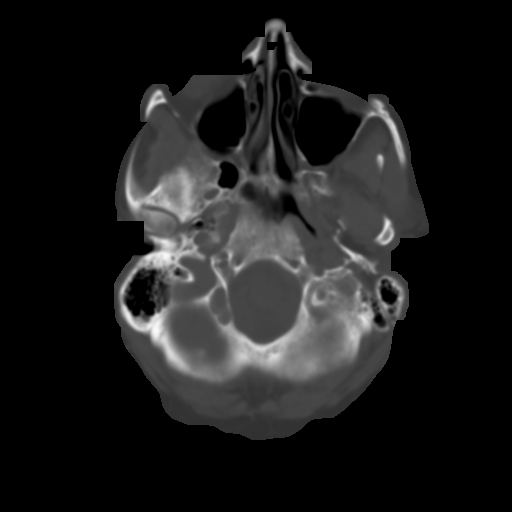
[im 9/33  brain]
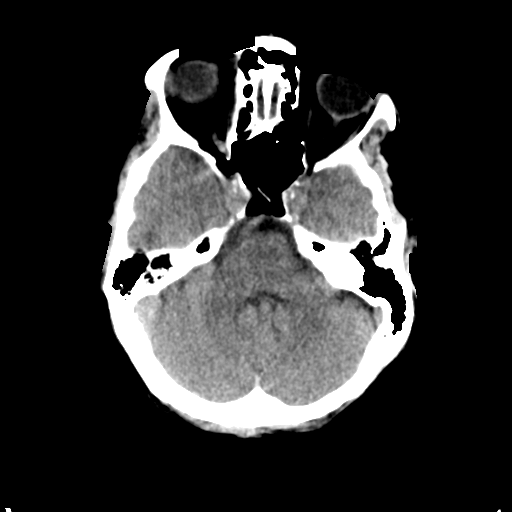
[im 13/33  brain]
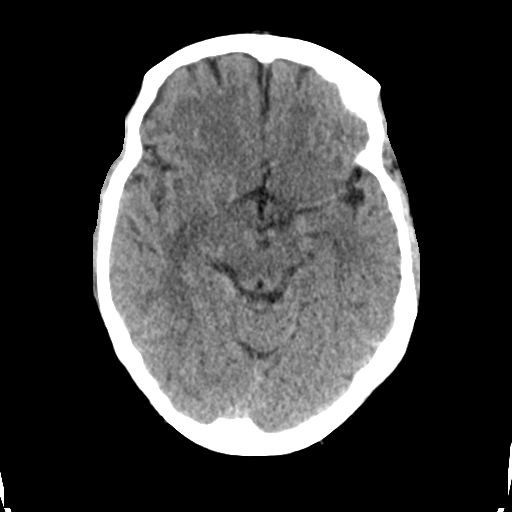
[im 17/33  brain]
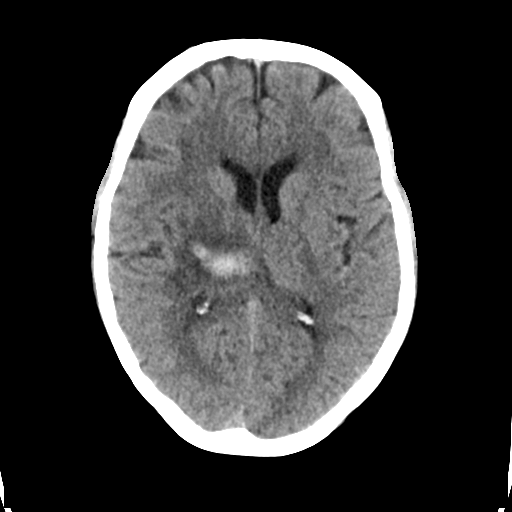
[im 21/33  brain]
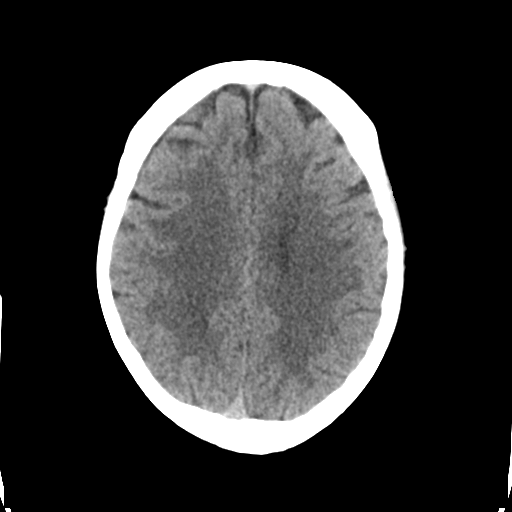
[im 21/33  bone]
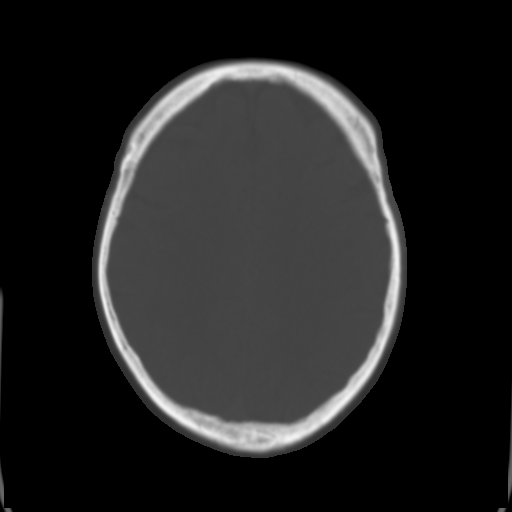
[im 25/33  brain]
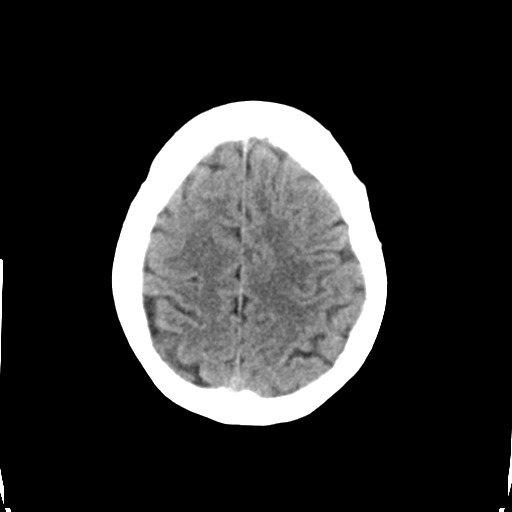
[im 29/33  brain]
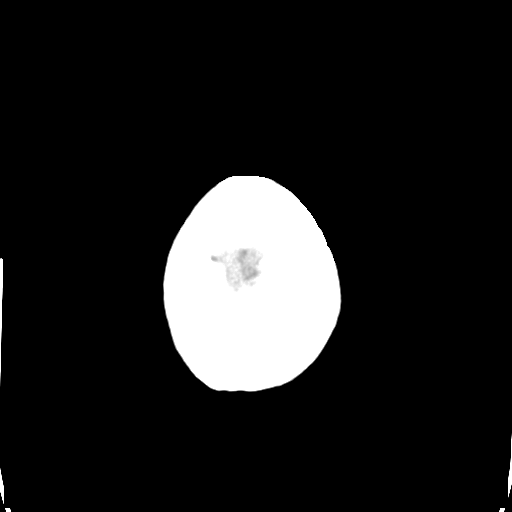

[Series 4: head bone · axial · 0.43mm/px · z∈[-121,-89]mm · 3 of 82 slices shown]
[im 9/82  bone]
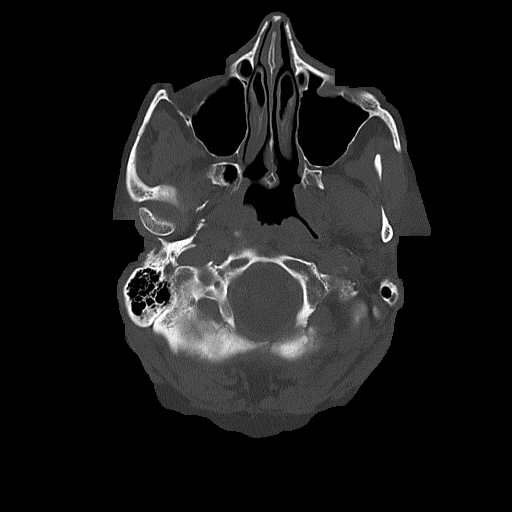
[im 17/82  bone]
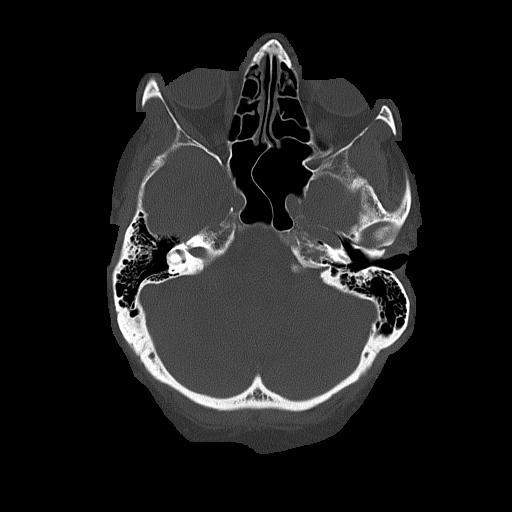
[im 25/82  bone]
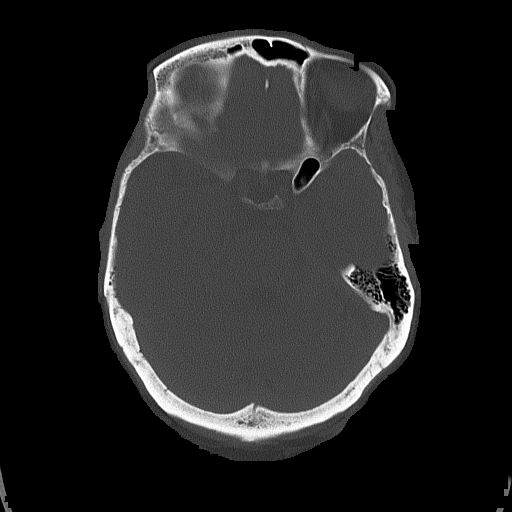

[Series 5: head without cor · coronal · non-contrast · 0.32mm/px · 3 of 66 slices shown]
[im 22/66  brain]
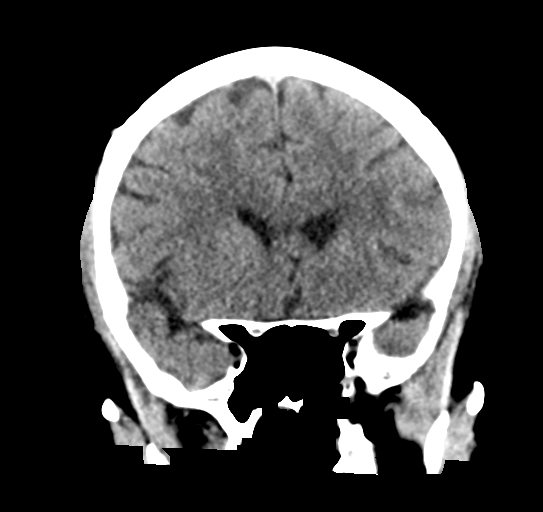
[im 29/66  brain]
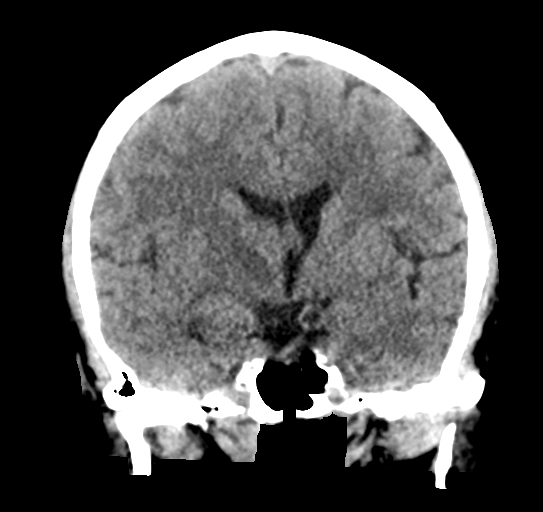
[im 37/66  brain]
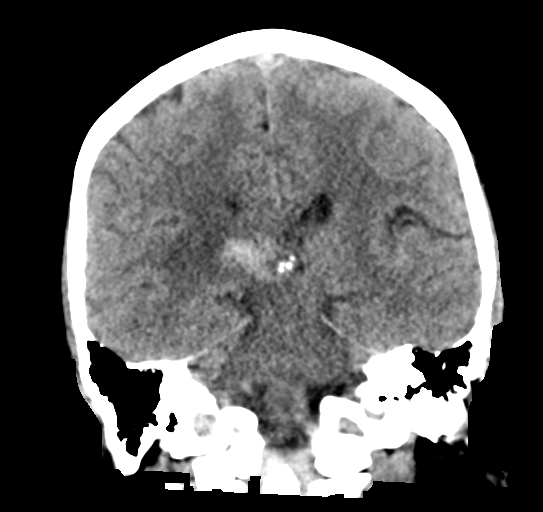

[Series 6: head without sag · sagittal · non-contrast · 0.31mm/px · 3 of 62 slices shown]
[im 21/62  brain]
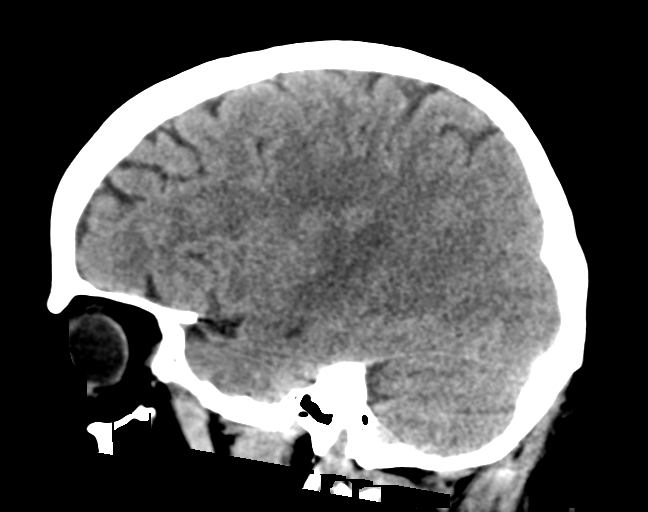
[im 31/62  brain]
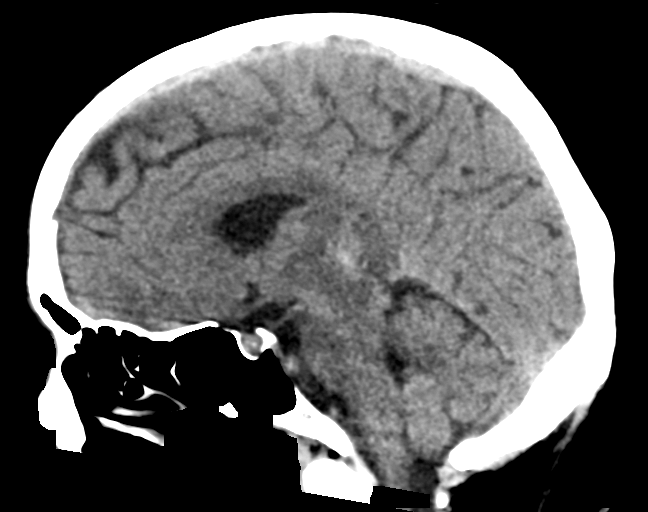
[im 41/62  brain]
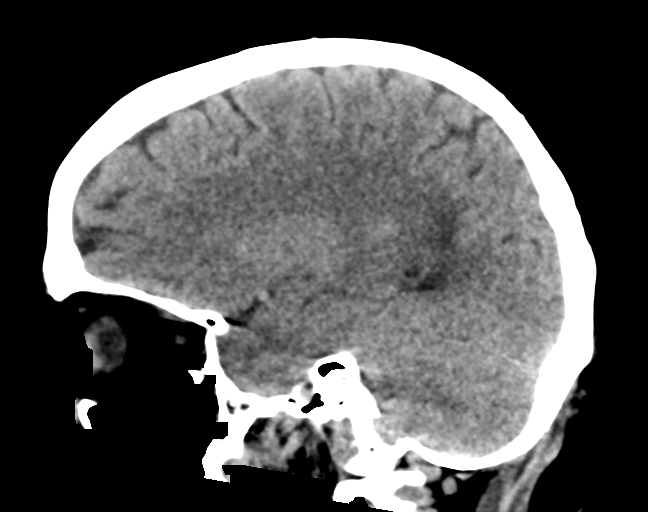

[16 of 47 positions shown; findings below may reference images not displayed]

FINDINGS: Brain:

No age advanced or lobar predominant parenchymal atrophy.

Unchanged size of a parenchymal hemorrhage centered within the right
thalamus, again measuring 3.4 x 1.8 x 2.3 cm (remeasured on prior).
Surrounding edema has progressed from the prior head CTs of
[DATE], but is similar to the prior brain MRI of [DATE]. As
before, there is local mass effect with partial effacement of the
third ventricle. Previously demonstrated small-volume
intraventricular hemorrhage has become less conspicuous and is no
longer well appreciated.

Mild patchy and ill-defined hypoattenuation within the cerebral
white matter, nonspecific but compatible with chronic small vessel
ischemic disease.

No demarcated cortical infarct.

No extra-axial fluid collection.

Vascular: No hyperdense vessel. Atherosclerotic calcifications.

Skull: No fracture or aggressive osseous lesion.

Sinuses/Orbits: No mass or acute finding within the imaged orbits.
Small mucous retention cysts within the left maxillary sinus at the
imaged levels.
IMPRESSION: 3.4 x 1.8 x 2.3 cm parenchymal hemorrhage centered within the right
thalamus, unchanged in size. Surrounding edema has progressed from
the prior head CTs of [DATE], but is similar to the prior brain
MRI of [DATE]. As before, there is local mass effect with
partial effacement of third ventricle. Previously demonstrated
small-volume intraventricular hemorrhage has become less conspicuous
and is no longer well appreciated.

## 2021-10-02 MED ORDER — ONDANSETRON HCL 4 MG PO TABS
4.0000 mg | ORAL_TABLET | Freq: Three times a day (TID) | ORAL | Status: DC | PRN
  Administered 2021-10-02 – 2021-11-06 (×6): 4 mg via ORAL
  Filled 2021-10-02 (×6): qty 1

## 2021-10-02 MED ORDER — GABAPENTIN 100 MG PO CAPS
100.0000 mg | ORAL_CAPSULE | Freq: Two times a day (BID) | ORAL | Status: DC
  Administered 2021-10-02 – 2021-10-07 (×11): 100 mg via ORAL
  Filled 2021-10-02 (×11): qty 1

## 2021-10-02 MED ORDER — METHOCARBAMOL 500 MG PO TABS
500.0000 mg | ORAL_TABLET | Freq: Three times a day (TID) | ORAL | Status: DC | PRN
  Administered 2021-10-02 – 2021-11-06 (×70): 500 mg via ORAL
  Filled 2021-10-02 (×74): qty 1

## 2021-10-02 MED ORDER — LORATADINE 10 MG PO TABS
10.0000 mg | ORAL_TABLET | Freq: Every day | ORAL | Status: DC
  Administered 2021-10-02: 10 mg via ORAL
  Filled 2021-10-02: qty 1

## 2021-10-02 MED ORDER — LORATADINE 10 MG PO TABS
10.0000 mg | ORAL_TABLET | Freq: Every day | ORAL | Status: DC | PRN
  Filled 2021-10-02: qty 1

## 2021-10-02 NOTE — Progress Notes (Signed)
Inpatient Rehabilitation  Patient information reviewed and entered into eRehab system by Loneta Tamplin M. Sheriden Archibeque, M.A., CCC/SLP, PPS Coordinator.  Information including medical coding, functional ability and quality indicators will be reviewed and updated through discharge.    

## 2021-10-02 NOTE — Evaluation (Signed)
Occupational Therapy Assessment and Plan  Patient Details  Name: Charlotte Hunter MRN: [id number] Date of Birth: [birth year]  OT Diagnosis: abnormal posture, disturbance of vision, hemiplegia affecting non-dominant side, and muscle weakness (generalized) Rehab Potential: Rehab Potential (ACUTE ONLY): Good ELOS: 4-4.5 weeks   Today's Date: 10/02/2021 OT Individual Time: 0800-0910 OT Individual Time Calculation (min): 70 min     Hospital Problem: Principal Problem:   ICH (intracerebral hemorrhage) ([place])   Past Medical History:  Past Medical History:  Diagnosis Date   A-fib [facility])    Aortic insufficiency    Hypertension    Past Surgical History: History reviewed. No pertinent surgical history.  Assessment & Plan Clinical Impression: Patient is a 66 y.o. year old female with history of hypertension, recurrent UTIs maintained on prophylactic Macrodantin, aortic insufficiency, PAF with history of ablation maintained on Eliquis.  Her latest cardiology notes that she was planning Xarelto to see if this may be more affordable for her however it was never started and she maintained Eliquis.  Per chart review patient lives alone.  1 level home 4 steps to entry.  Independent prior to admission.  Daughter is planning to stay with her on discharge to provide assistance as needed.  Presented 09/25/2021 with acute onset of left-sided weakness as well as blurred vision.  CT of the head showed hemorrhage centered in the right thalamus and basal ganglia with mild surrounding edema, minimal midline shift, and a small amount of intraventricular extension into the third and fourth ventricle.  Patient was reversed with Andexxa.  Follow-up MRI/MRA unchanged size and morphology of acute intraparenchymal hemorrhage.  No hydrocephalus or trapping.  There was noted focal severe distal left P2 stenosis on MRA.  Admission chemistries unremarkable except potassium 3.2, urinalysis negative.  Echocardiogram with ejection  fraction of 65 to 70% no wall motion abnormalities.  She did require short-term intubation through 09/27/2021.  Currently maintained on a mechanical soft nectar thick liquid diet and advanced to thin liquids 10/01/2021.  Therapy evaluations completed due to patient decreased functional mobility left-sided weakness was admitted for a comprehensive rehab program.  .  Patient transferred to CIR on 10/01/2021 .    Patient currently requires  max to total A  with basic self-care skills secondary to muscle weakness, decreased cardiorespiratoy endurance, impaired timing and sequencing, abnormal tone, unbalanced muscle activation, and decreased coordination, diplopia/blurry vision, decreased midline orientation and decreased attention to left, and decreased sitting balance, decreased standing balance, decreased postural control, hemiplegia, and decreased balance strategies.  Prior to hospitalization, patient could complete BADL/IADL/mobility with independent .  Patient will benefit from skilled intervention to decrease level of assist with basic self-care skills and increase independence with basic self-care skills prior to discharge home with care partner.  Anticipate patient will require 24 hour supervision, minimal physical assistance, and moderate physical assestance and follow up home health.  OT - End of Session Activity Tolerance: Tolerates 10 - 20 min activity with multiple rests Endurance Deficit: Yes Endurance Deficit Description: significant fatigue with ADL OT Assessment Rehab Potential (ACUTE ONLY): Good OT Barriers to Discharge: Decreased caregiver support;Home environment access/layout;Inaccessible home environment OT Patient demonstrates impairments in the following area(s): Balance;Cognition;Endurance;Motor;Perception;Safety;Vision;Sensory;Pain OT Basic ADL's Functional Problem(s): Eating;Grooming;Bathing;Dressing;Toileting OT Transfers Functional Problem(s): Toilet;Tub/Shower OT Additional  Impairment(s): Fuctional Use of Upper Extremity OT Plan OT Intensity: Minimum of 1-2 x/day, 45 to 90 minutes OT Frequency: 5 out of 7 days OT Duration/Estimated Length of Stay: 4-4.5 weeks OT Treatment/Interventions: Balance/vestibular training;Disease mangement/prevention;Neuromuscular re-education;Self Care/advanced ADL retraining;Therapeutic Exercise;Wheelchair  propulsion/positioning;UE/LE Strength taining/ROM;Skin care/wound managment;Pain management;DME/adaptive equipment instruction;Cognitive remediation/compensation;Community reintegration;Functional electrical stimulation;Patient/family education;Splinting/orthotics;Visual/perceptual remediation/compensation;UE/LE Coordination activities;Therapeutic Activities;Functional mobility training;Psychosocial support;Discharge planning OT Self Feeding Anticipated Outcome(s): set-up a OT Basic Self-Care Anticipated Outcome(s): min to mod OT Toileting Anticipated Outcome(s): mod OT Bathroom Transfers Anticipated Outcome(s): min OT Recommendation Recommendations for Other Services: Therapeutic Recreation consult Therapeutic Recreation Interventions: Pet therapy;Kitchen group;Stress management Patient destination: Home Follow Up Recommendations: 24 hour supervision/assistance;Home health OT Equipment Recommended: To be determined   OT Evaluation Precautions/Restrictions  Precautions Precautions: Fall Precaution Comments: L hemiplegia, L inattention, R hip and knee pain Restrictions Weight Bearing Restrictions: No General Chart Reviewed: Yes PT Missed Treatment Reason: MD hold (Comment) (head CT) Response to Previous Treatment: Not applicable Family/Caregiver Present: Yes (daughter [name])  Pain Pain Assessment Pain Scale: 0-10 Pain Score: 4  Pain Type: Acute pain Pain Location: Other (Comment) (Neck, back, hip.) Pain Intervention(s): Medication (See eMAR);Repositioned;Massage;Heat applied;Relaxation Home Living/Prior Functioning Home  Living Living Arrangements: Alone Available Help at Discharge: Family, Available 24 hours/day (daughter plans to move in with her post [place]) Type of Home: House Home Access: Stairs to enter [employer] of Steps: 4 Entrance Stairs-Rails: Can reach both Home Layout: One level Bathroom Shower/Tub: Multimedia programmer: Handicapped height Bathroom Accessibility: Yes Additional Comments: daughter is planning to stay with pt  Lives With: Alone IADL History Homemaking Responsibilities: Yes Meal Prep Responsibility: Primary Laundry Responsibility: Primary Cleaning Responsibility: Primary Bill Paying/Finance Responsibility: Primary Shopping Responsibility: Primary Child Care Responsibility: No Occupation: Retired Tax adviser: gardening Prior Function Level of Independence: Independent with basic ADLs, Independent with gait, Independent with transfers, Independent with homemaking with ambulation  Able to Take Stairs?: Yes Driving: Yes Vision Baseline Vision/History: 1 Wears glasses (bifocals) Ability to See in Adequate Light: 1 Impaired Patient Visual Report: Diplopia;Blurring of vision Vision Assessment?: Yes Eye Alignment: Within Functional Limits Ocular Range of Motion: Within Functional Limits Alignment/Gaze Preference: Gaze right;Head turned Tracking/Visual Pursuits: Able to track stimulus in all quads without difficulty Saccades: Decreased speed of saccadic movement Convergence: Impaired - to be further tested in functional context Visual Fields: No apparent deficits Perception  Perception: Impaired Inattention/Neglect: Does not attend to left side of body;Does not attend to left visual field Praxis Praxis: Impaired Praxis Impairment Details: Motor planning Cognition Cognition Overall Cognitive Status: Impaired/Different from baseline Arousal/Alertness: Awake/alert Orientation Level: Person;Place;Situation Person: Oriented Place:  Oriented Situation: Oriented Memory: Impaired Memory Impairment: Decreased recall of new information Awareness: Impaired Problem Solving: Appears intact Safety/Judgment: Impaired Comments: Pt able to recognize L lean when sitting EOB, but noted L inattention/R gaze preference. Brief Interview for Mental Status (BIMS) Repetition of Three Words (First Attempt): 3 Temporal Orientation: Year: Correct Temporal Orientation: Month: Accurate within 5 days Temporal Orientation: Day: Incorrect Recall: "Sock": Yes, no cue required Recall: "Blue": Yes, no cue required Recall: "Bed": Yes, no cue required BIMS Summary Score: 14 Sensation Sensation Light Touch: Impaired by gross assessment Hot/Cold: Not tested Proprioception: Impaired by gross assessment Stereognosis: Impaired by gross assessment Additional Comments: dense L hemi with paresthesias Coordination Gross Motor Movements are Fluid and Coordinated: No Fine Motor Movements are Fluid and Coordinated: No Coordination and Movement Description: dense L hemi with paresthesias Finger Nose Finger Test: unable to complete on L Motor  Motor Motor: Hemiplegia;Abnormal tone;Abnormal postural alignment and control Motor - Skilled Clinical Observations: dense L hemi with paresthesias  Trunk/Postural Assessment  Cervical Assessment Cervical Assessment: Exceptions to [facility] (head tilt/gaze R) Thoracic Assessment Thoracic Assessment: Exceptions to WFL (rounded shoulders/ L lean)  Lumbar Assessment Lumbar Assessment: Exceptions to [facility] (posterior pelvic tilt) Postural Control Postural Control: Deficits on evaluation (L LOB)  Balance Balance Balance Assessed: Yes Static Sitting Balance Static Sitting - Balance Support: Feet supported;No upper extremity supported Static Sitting - Level of Assistance: 2: Max Insurance risk surveyor Standing - Balance Support: Right upper extremity supported Static Standing - Level of Assistance: 3:  Mod assist Extremity/Trunk Assessment RUE Assessment RUE Assessment: Within Functional Limits LUE Assessment LUE Assessment: Exceptions to [facility] LUE Body System: Neuro Brunstrum levels for arm and hand: Arm;Hand Brunstrum level for arm: Stage I Presynergy Brunstrum level for hand: Stage I Flaccidity  Care Tool Care Tool Self Care Eating   Eating Assist Level: Supervision/Verbal cueing    Oral Care         Bathing   Body parts bathed by patient: Face Body parts bathed by helper: Left arm;Right arm;Chest;Abdomen   Assist Level: Maximal Assistance - Patient 24 - 49%    Upper Body Dressing(including orthotics)   What is the patient wearing?: Pull over shirt   Assist Level: Maximal Assistance - Patient 25 - 49%    Lower Body Dressing (excluding footwear)   What is the patient wearing?: Pants Assist for lower body dressing: Total Assistance - Patient < 25%    Putting on/Taking off footwear   What is the patient wearing?: Non-skid slipper socks Assist for footwear: Total Assistance - Patient < 25%       Care Tool Toileting Toileting activity Toileting Activity did not occur (Clothing management and hygiene only): N/A (no void or bm)       Care Tool Bed Mobility Roll left and right activity        Sit to lying activity   Sit to lying assist level: Maximal Assistance - Patient 25 - 49%    Lying to sitting on side of bed activity   Lying to sitting on side of bed assist level: the ability to move from lying on the back to sitting on the side of the bed with no back support.: Maximal Assistance - Patient 25 - 49%     Care Tool Transfers Sit to stand transfer   Sit to stand assist level: Maximal Assistance - Patient 25 - 49%    Chair/bed transfer Chair/bed transfer activity did not occur: Safety/medical concerns       Toilet transfer Toilet transfer activity did not occur: Safety/medical concerns       Care Tool Cognition  Expression of Ideas and Wants Expression  of Ideas and Wants: 4. Without difficulty (complex and basic) - expresses complex messages without difficulty and with speech that is clear and easy to understand  Understanding Verbal and Non-Verbal Content Understanding Verbal and Non-Verbal Content: 3. Usually understands - understands most conversations, but misses some part/intent of message. Requires cues at times to understand   Memory/Recall Ability Memory/Recall Ability : Staff names and faces;That he or she is in a hospital/hospital unit;Current season   Refer to Care Plan for Long Term Goals  SHORT TERM GOAL WEEK 1 OT Short Term Goal 1 (Week 1): Pt will maintain static sitting balance EOB with no more than min A for >5 min. OT Short Term Goal 2 (Week 1): Pt will complete >2 grooming tasks seated at sink with S. OT Short Term Goal 3 (Week 1): Pt will don shirt in supported sitting mod A. OT Short Term Goal 4 (Week 1): Pt will complete toilet transfer and LRAD with max A.  Recommendations for other services: Therapeutic Recreation  Pet therapy, Kitchen group, and Stress management   Skilled Therapeutic Intervention ADL ADL Eating: Set up;Supervision/safety Where Assessed-Eating: Bed level Grooming: Maximal assistance Where Assessed-Grooming: Edge of bed Upper Body Bathing: Maximal assistance Where Assessed-Upper Body Bathing: Edge of bed Lower Body Bathing: Maximal assistance Where Assessed-Lower Body Bathing: Edge of bed Upper Body Dressing: Maximal assistance Where Assessed-Upper Body Dressing: Edge of bed Lower Body Dressing: Maximal assistance (+2) Where Assessed-Lower Body Dressing: Edge of bed Toileting: Maximal assistance Where Assessed-Toileting: Bed level Toilet Transfer: Not assessed Tub/Shower Transfer: Not assessed Social research officer, government: Not assessed Mobility  Bed Mobility Bed Mobility: Sit to Supine;Supine to Sit;Scooting to HOB Supine to Sit: Maximal Assistance - Patient - Patient 25-49% Sit to Supine:  Maximal Assistance - Patient 25-49% Scooting to [facility]: Total Assistance - Patient < 25% Transfers Sit to Stand: Maximal Assistance - Patient 25-49% Stand to Sit: Moderate Assistance - Patient 50-74%  Session Eval: Pt received seim-reclined in bed getting set-up for breakfast with NT, reports chronic R hip pain, but agreeable to OT eval. MD in/out morning rounds.Reviewed role of CIR OT, evaluation process, ADL/func mobility retraining, goals for therapy, and safety plan. Evaluation completed as documented above.  Came to sitting EOB with max A to progress hemi-body and to lift trunk. L lean in sitting requiring max A to support, R gaze preference noted. Progresses to CGA/ min A for balance when RUE supported on back of chair. Completed UB bathing/grooming//dressing with max A due to balance. Total A to don pants with RUE on back of chair, came into standing with light max A and able to weightbear on LLE, but only able to maintain standing for 1 min due to fatigue. Heavy max A to scoot up towards HOB on her L and max A to return to supine. Pt is able to boost herself up in bed with use of bed features. Daughter present throughout session. Pt left with HOB over 30 degrees with daughter present with bed alarm engaged, call bell in reach, and all immediate needs met. EVS present.  Discharge Criteria: Patient will be discharged from OT if patient refuses treatment 3 consecutive times without medical reason, if treatment goals not met, if there is a change in medical status, if patient makes no progress towards goals or if patient is discharged from hospital.  The above assessment, treatment plan, treatment alternatives and goals were discussed and mutually agreed upon: by patient and by family  Volanda Napoleon MS, OTR/L  10/02/2021, 9:07 AM

## 2021-10-02 NOTE — Progress Notes (Signed)
Inpatient Rehabilitation Center Individual Statement of Services  Patient Name:  Eldora Napp  Date:  10/02/2021  Welcome to the Inpatient Rehabilitation Center.  Our goal is to provide you with an individualized program based on your diagnosis and situation, designed to meet your specific needs.  With this comprehensive rehabilitation program, you will be expected to participate in at least 3 hours of rehabilitation therapies Monday-Friday, with modified therapy programming on the weekends.  Your rehabilitation program will include the following services:  Physical Therapy (PT), Occupational Therapy (OT), Speech Therapy (ST), 24 hour per day rehabilitation nursing, Therapeutic Recreaction (TR), Neuropsychology, Care Coordinator, Rehabilitation Medicine, Nutrition Services, Pharmacy Services, and Other  Weekly team conferences will be held on Wednesdays to discuss your progress.  Your Inpatient Rehabilitation Care Coordinator will talk with you frequently to get your input and to update you on team discussions.  Team conferences with you and your family in attendance may also be held.  Expected length of stay:  16-18 Days  Overall anticipated outcome: Supervision/Min A  Depending on your progress and recovery, your program may change. Your Inpatient Rehabilitation Care Coordinator will coordinate services and will keep you informed of any changes. Your Inpatient Rehabilitation Care Coordinator's name and contact numbers are listed  below.  The following services may also be recommended but are not provided by the Inpatient Rehabilitation Center:   Home Health Rehabiltiation Services Outpatient Rehabilitation Services    Arrangements will be made to provide these services after discharge if needed.  Arrangements include referral to agencies that provide these services.  Your insurance has been verified to be:  Medicare A & B Your primary doctor is:   Ocie Bob, MD  Pertinent  information will be shared with your doctor and your insurance company.  Inpatient Rehabilitation Care Coordinator:  Lavera Guise, [place] [phone] or [phone]  Information discussed with and copy given to patient by: Andria Rhein, 10/02/2021, 10:52 AM

## 2021-10-02 NOTE — Plan of Care (Signed)
  Problem: RH Swallowing Goal: LTG Patient will consume least restrictive diet using compensatory strategies with assistance (SLP) Description: LTG:  Patient will consume least restrictive diet using compensatory strategies with assistance (SLP) Flowsheets (Taken 10/02/2021 1741) LTG: Pt Patient will consume least restrictive diet using compensatory strategies with assistance of (SLP): Modified Independent

## 2021-10-02 NOTE — Progress Notes (Signed)
Patient ID: Charlotte Hunter, female   DOB: [birth year], 66 y.o.   MRN: [id number] Met with the patient and daughter to review rehab process, team conference and plan of care. Reviewed current situation and secondary risks. Patient on D3 diet, patient does not like food choices on D3 diet and daughter expressed concerns about diet consistency. Reviewed disciplines and topics covered; deferred diet order to SLP and reviewed food from outside that would meet D3 restrictions. Given information on prediabetes, diet modification recommendations. Daughter expressed concerns about provision of care at discharge; works and moving into patient's home temporarily. Home has small doorframes and 4 ste; daughter concerned about ramp for steps at discharge and recommendations for discharge to home when patient cannot access entry or bathrooms; daughter to measure doorframes and provide another picture of doorway. She shared pics with PT this morning. Patient is anxious and wants to be independent. Patient noted concerns about medications for PAF; Xarelto was >$500. Per month. Reinforced team will review barriers to discharge at team conference. SW will weigh in on options for SNF vs home and resources available to DME and ramp if needed. Continue to follow along to discharge to address educational needs to facilitate preparation for discharge home. Adden: at the end of our discussion, patient noted she felt strange; sensation down right side of the back of her head. Reports increased anxiety and did not want to be left alone as this is similar to how she felt with the initial stroke. NT notified of vital sign check, nurse and PA made aware of reported symptoms. PT coming into the room for eval and session put on hold pending PA recommendations. Margarito Liner

## 2021-10-02 NOTE — Discharge Instructions (Addendum)
Inpatient Rehab Discharge Instructions  Charlotte Hunter Discharge date and time: No discharge date for patient encounter.   Activities/Precautions/ Functional Status: Activity: activity as tolerated Diet: Regular Wound Care: Routine skin checks Functional status:  ___ No restrictions     ___ Walk up steps independently ___ 24/7 supervision/assistance   ___ Walk up steps with assistance ___ Intermittent supervision/assistance  ___ Bathe/dress independently ___ Walk with walker     _x__ Bathe/dress with assistance ___ Walk Independently    ___ Shower independently ___ Walk with assistance    ___ Shower with assistance ___ No alcohol     ___ Return to work/school ________  COMMUNITY REFERRALS UPON DISCHARGE:    Home Health:   PT     OT                    Agency: [facility]  Phone: [phone]   Medical Equipment/Items Ordered: Custom Wheelchair, Rolling Walker, Drop Arm Bedside Commode, Tub Transfer Bench                                                  Agency/Supplier: [facility] [phone]   Special Instructions: No driving smoking or alcohol  Continue to hold anticoagulation until follow-up with neurology services.   My questions have been answered and I understand these instructions. I will adhere to these goals and the provided educational materials after my discharge from the hospital.  Patient/Caregiver Signature _______________________________ Date __________  Clinician Signature _______________________________________ Date __________  Please bring this form and your medication list with you to all your follow-up doctor's appointments.

## 2021-10-02 NOTE — Progress Notes (Signed)
Inpatient Rehabilitation Care Coordinator Assessment and Plan Patient Details  Name: Charlotte Hunter MRN: [id number] Date of Birth: [birth year]  Today's Date: 10/02/2021  Hospital Problems: Principal Problem:   ICH (intracerebral hemorrhage) [facility])  Past Medical History:  Past Medical History:  Diagnosis Date   A-fib [facility]    Aortic insufficiency    Hypertension    Past Surgical History: History reviewed. No pertinent surgical history. Social History:  reports that she has never smoked. She has never used smokeless tobacco. She reports current alcohol use of about 1.0 standard drink of alcohol per week. She reports that she does not use drugs.  Family / Support Systems Children: [name] Anticipated Caregiver: Johnette Abraham (Daughter) Ability/Limitations of Caregiver: intermittent Family Dynamics: support from daughter  Social History Preferred language: English Religion:  Health Literacy - How often do you need to have someone help you when you read instructions, pamphlets, or other written material from your doctor or pharmacy?: Never Writes: Yes   Abuse/Neglect Abuse/Neglect Assessment Can Be Completed: Yes Physical Abuse: Denies Verbal Abuse: Denies Sexual Abuse: Denies Exploitation of patient/patient's resources: Denies Self-Neglect: Denies  Patient response to: Social Isolation - How often do you feel lonely or isolated from those around you?: Never  Emotional Status Recent Psychosocial Issues: coping Psychiatric History: n/a Substance Abuse History: n/a  Patient / Family Perceptions, Expectations & Goals Pt/Family understanding of illness & functional limitations: yes Premorbid pt/family roles/activities: Patient previously independent and driving Anticipated changes in roles/activities/participation: daughter able to provide intermittent assistance Pt/family expectations/goals: [employer]: None Premorbid Home  Care/DME Agencies: None Transportation available at discharge: family able to transport Is the patient able to respond to transportation needs?: Yes In the past 12 months, has lack of transportation kept you from medical appointments or from getting medications?: No In the past 12 months, has lack of transportation kept you from meetings, work, or from getting things needed for daily living?: No  Discharge Planning Living Arrangements: Alone Support Systems: Children Type of Residence: Private residence Insurance Resources: Medicare Financial Screen Referred: No Money Management: Patient Does the patient have any problems obtaining your medications?: Yes (Describe) (COST) Home Management: Independent Patient/Family Preliminary Plans: [place] to assist if patient unable to manage Care Coordinator Barriers to Discharge: Lack of/limited family support, Decreased caregiver support, Neurogenic Bowel & Bladder, IV antibiotics Care Coordinator Anticipated Follow Up Needs: HH/OP DC Planning Additional Notes/Comments: potenital SNF daughter being unable to provide 24/7 supervision Expected length of stay: 16-18 Days  Clinical Impression SW met with patient and daughter and introduced self, explained role and addressed questions and concerns. Patients daughter concerned because she will be unable to provide 24/7 supervision. Patient recently just bought a home in [place] that will require a ramp and doorways are small. SW provided patient daughter with SNF and [facility] resources. No additional questions or concerns.   Dyanne Iha 10/02/2021, 12:35 PM

## 2021-10-02 NOTE — Progress Notes (Signed)
Pt and daughter have multiple request for treatment and medications throughout shift. Per orders pt is to be in and out cathed with no void. Pt states after last in and out cath at 1500, she wishes to not be cathed any more. Pt advised per PA she may attempt to void 2 times prior to in & out cath. Pt and daughter concerned about pts pain and not being able to sleep through the night while in the hospital. Pt advised to request PRN medications and will provide pt with K pad.

## 2021-10-02 NOTE — Progress Notes (Signed)
Pt attempted to void multiple times this AM. Placed on bedpan, pt complained about being cold multiple times while being uncovered.  Requested nursing to stay at bedside while attempting to void. Pt refused cath until other alternatives were attempted. Was unable to void, bladder scanned for 442, cathed for 650. Warm blanket provided. Muscle rub applied to neck for soreness, pt then requested nurse to massage neck, lower back, and hip. Pillows placed under arm, hip, and legs. Pt then requested another warm blanket which was provided. Offered to adjust thermostat which was set at 68 and pt refused. Also requested Eye drops which were administered. Bed alarm on, in the lowest position with call bell within reach.

## 2021-10-02 NOTE — Progress Notes (Signed)
PROGRESS NOTE   Subjective/Complaints:  Left side body numb, some tingling pain , discussed CVA location   ROS- neg CP, SOB, N/V/D  Objective:   No results found. Recent Labs    10/01/21 0408 10/02/21 0641  WBC 8.1 7.7  HGB 11.7* 11.9*  HCT 35.2* 36.2  PLT 244 268   Recent Labs    10/01/21 0408 10/02/21 0641  NA 137 140  K 3.7 3.8  CL 104 104  CO2 24 26  GLUCOSE 116* 117*  BUN 10 14  CREATININE 0.57 0.63  CALCIUM 9.3 9.5    Intake/Output Summary (Last 24 hours) at 10/02/2021 0801 Last data filed at 10/02/2021 0535 Gross per 24 hour  Intake 240 ml  Output 1650 ml  Net -1410 ml        Physical Exam: Vital Signs Blood pressure (!) 112/53, pulse 87, temperature (!) 97.5 F (36.4 C), resp. rate 15, weight 83.7 kg, SpO2 94 %.  General: No acute distress Mood and affect are appropriate Heart: Regular rate and rhythm no rubs murmurs or extra sounds Lungs: Clear to auscultation, breathing unlabored, no rales or wheezes Abdomen: Positive bowel sounds, soft nontender to palpation, nondistended Extremities: No clubbing, cyanosis, or edema Skin: No evidence of breakdown, no evidence of rash Neurologic: Cranial nerves II through XII intact, motor strength is 5/5 in right and 0/5 left deltoid, bicep, tricep, grip, hip flexor, knee extensors, ankle dorsiflexor and plantar flexor Sensory exam normal sensation to light touch and proprioception in right and absent left  upper and lower extremities  Musculoskeletal: Full range of motion in all 4 extremities. No joint swelling    Assessment/Plan: 1. Functional deficits which require 3+ hours per day of interdisciplinary therapy in a comprehensive inpatient rehab setting. Physiatrist is providing close team supervision and 24 hour management of active medical problems listed below. Physiatrist and rehab team continue to assess barriers to discharge/monitor patient  progress toward functional and medical goals  Care Tool:  Bathing              Bathing assist       Upper Body Dressing/Undressing Upper body dressing        Upper body assist      Lower Body Dressing/Undressing Lower body dressing            Lower body assist       Toileting Toileting    Toileting assist Assist for toileting: 2 Helpers     Transfers Chair/bed transfer  Transfers assist  Chair/bed transfer activity did not occur: Safety/medical concerns        Locomotion Ambulation   Ambulation assist              Walk 10 feet activity   Assist           Walk 50 feet activity   Assist           Walk 150 feet activity   Assist           Walk 10 feet on uneven surface  activity   Assist           Wheelchair     Assist  Wheelchair 50 feet with 2 turns activity    Assist            Wheelchair 150 feet activity     Assist          Blood pressure (!) 112/53, pulse 87, temperature (!) 97.5 F (36.4 C), resp. rate 15, weight 83.7 kg, SpO2 94 %.  Medical Problem List and Plan: 1. Functional deficits secondary to right thalamic ICH with small IVH likely secondary to hypertension and Eliquis Repeat CT head in 2 wks to assess bleed.  Have neuro review to see if Xarelto can be started              -patient may  shower             -ELOS/Goals: 16-18d Sup/minA 2.  Antithrombotics: -DVT/anticoagulation:  Mechanical: Antiembolism stockings, thigh (TED hose) Bilateral lower extremities             -antiplatelet therapy: N/A 3. Pain Management: Tylenol as needed, has burning discomfort on Left side  4. Mood/Sleep: Provide emotional support             -antipsychotic agents: N/A 5. Neuropsych/cognition: This patient is capable of making decisions on her own behalf. 6. Skin/Wound Care: Routine skin checks 7. Fluids/Electrolytes/Nutrition: Routine in and outs with follow-up  chemistries 8.  Dysphagia.  Dysphagia #3 thin liquids 9.  Hypertension.  Cozaar 50 mg daily, Lopressor 25 mg twice daily.  Monitor with increased mobility Vitals:   10/01/21 1543 10/01/21 1954  BP: 131/67 (!) 112/53  Pulse: 91 87  Resp: 17 15  Temp: 98.9 F (37.2 C) (!) 97.5 F (36.4 C)  SpO2: 92% 94%    There were no vitals filed for this visit.  10.  PAF.  Eliquis on hold due to ICH.  Continue Lopressor.  Cardiac rate controlled. 11.  Obesity.  BMI 29.21.  Dietary follow-up 12.  History of recurrent UTI.  Prophylactic Macrodantin 50 mg daily.  13.  Incont of bladder no issues prior to CVA    LOS: 1 days A FACE TO FACE EVALUATION WAS PERFORMED  Erick Colace 10/02/2021, 8:01 AM

## 2021-10-02 NOTE — Evaluation (Signed)
Physical Therapy Assessment and Plan  Patient Details  Name: Charlotte Hunter MRN: [id number] Date of Birth: 1955/04/30  Today's Date: 10/02/2021 PT Missed Time: 75 Minutes Missed Time Reason: MD hold (Comment) (head CT)   Pt received supine in bed with Charlotte Hunter, Care Coordinator, nurse tech, and pt's daughter Charlotte Hunter) present with pt reporting immediately prior to therapist entering room she had sudden "zing" pain up the back of her neck on the R side with associated numbness in her 2nd and 3rd fingers as well as numbness along R side of her lips. Charlotte Oka, PA present to assess - ordered a head CT and requesting to wait until after results to perform therapy eval.  Charlotte Hunter , PT, DPT, NCS, CSRS 10/02/2021, 11:08 AM

## 2021-10-02 NOTE — Evaluation (Addendum)
Speech Language Pathology Assessment and Plan  Patient Details  Name: Charlotte Hunter MRN: [id number] Date of Birth: April 02, 1956  SLP Diagnosis: Dysphagia;Speech and Language deficits;Cognitive Impairments  Rehab Potential: Excellent ELOS: 3.5-4 weeks - suspect shorter for SLP   Today's Date: 10/02/2021 SLP Individual Time: 1300-1400 SLP Individual Time Calculation (min): 60 min  Hospital Problem: Principal Problem:   ICH (intracerebral hemorrhage) ([place])  Past Medical History:  Past Medical History:  Diagnosis Date   A-fib [facility])    Aortic insufficiency    Hypertension    Past Surgical History: History reviewed. No pertinent surgical history.  Assessment / Plan / Recommendation Clinical Impression Charlotte Hunter is a 66 year old right-handed female with history of hypertension, recurrent UTIs maintained on prophylactic Macrodantin, aortic insufficiency, PAF with history of ablation maintained on Eliquis.  Her latest cardiology notes that she was planning Xarelto to see if this may be more affordable for her however it was never started and she maintained Eliquis.  Per chart review patient lives alone.  1 level home 4 steps to entry.  Independent prior to admission.  Daughter is planning to stay with her on discharge to provide assistance as needed.  Presented 09/25/2021 with acute onset of left-sided weakness as well as blurred vision.  CT of the head showed hemorrhage centered in the right thalamus and basal ganglia with mild surrounding edema, minimal midline shift, and a small amount of intraventricular extension into the third and fourth ventricle.  Patient was reversed with Andexxa.  Follow-up MRI/MRA unchanged size and morphology of acute intraparenchymal hemorrhage.  No hydrocephalus or trapping.  There was noted focal severe distal left P2 stenosis on MRA.  Admission chemistries unremarkable except potassium 3.2, urinalysis negative.  Echocardiogram with ejection fraction of 65 to  70% no wall motion abnormalities.  She did require short-term intubation through 09/27/2021.  Currently maintained on a mechanical soft nectar thick liquid diet and advanced to thin liquids 10/01/2021.  Therapy evaluations completed due to patient decreased functional mobility left-sided weakness was admitted for a comprehensive rehab program.  Patient seen for speech-language-cognitive evaluations and bedside swallow assessment. Pt was accompanied by her daughter. Pt/dtr deny baseline cognitive deficits and feel pt is at/near cognitive baseline.Pt was fully independent prior to admission and reports having her dtr's support at discharge however unable to provide 24 hour supervision. [facility] Mental Status Examination was completed to evaluate the pt's cognitive-linguistic skills. Pt achieved a score of 28/30 which is considered within the normal range. Subjectively, pt exhibited decreased sustained attention and verbose requiring intermittent verbal redirection to task throughout session. This interfered with cognitive assessment and negatively impacted recall of new information during both structured and unstructured contexts. May be baseline per pt/dtr report? Expressive/receptive language skills appeared [facility] for all tasks assessed.  Oral mechanism exam remarkable for mild L oral and lingual weakness slightly impacting articulatory placement, however not negatively impacting speech intelligibly. Despite intubation impacting pt's vocal quality/intensity several days ago per pt/dtr report, pt's voice was perceived as normal with adequate vocal intensity at the conversational level. No further speech needs identified at this time. Do not plan to address.  BSE completed and suggestive of mild oropharyngeal dysphagia characterized by anterior labial spillage on left with puree, mildly prolonged mastication, and delayed cough x1 with initial thin liquid straw sip. Pt reported increased fatigue  during trials and wished to discontinue to rest. SLP recommends continuation of dysphagia 3 diet and thin liquids. Meds whole in puree. Full supervision with all  intake.   Patient would benefit from skilled SLP intervention to maximize swallowing safety/diet tolerance, cognitive functioning, and overall functional independence prior to discharge.   Skilled Therapeutic Interventions          Cognitive-linguistic-speech-language assessments completed. BSE completed. Please see details above.   SLP Assessment  Patient will need skilled Speech Lanaguage Pathology Services during CIR admission    Recommendations  SLP Diet Recommendations: Dysphagia 3 (Mech soft);Thin Liquid Administration via: Cup;Straw Medication Administration: Whole meds with puree Supervision: Patient able to self feed Compensations: Minimize environmental distractions;Slow rate;Small sips/bites;Monitor for anterior loss Postural Changes and/or Swallow Maneuvers: Seated upright 90 degrees Oral Care Recommendations: Oral care BID Recommendations for Other Services: Therapeutic Recreation consult Therapeutic Recreation Interventions: [place] group Patient destination: Home Follow up Recommendations: 24 hour supervision/assistance;Other (comment) (other TBD) Equipment Recommended: To be determined    SLP Frequency 1 to 3 out of 7 days   SLP Duration  SLP Intensity  SLP Treatment/Interventions 3.5-4 weeks - suspect shorter for SLP  Minumum of 1-2 x/day, 30 to 90 minutes  Cognitive remediation/compensation;Dysphagia/aspiration precaution training;Speech/Language facilitation;Functional tasks;Patient/family education    Pain Pain Assessment Pain Scale: 0-10 Pain Score: 7  Pain Type: Chronic pain (chronic R hip pain from "bursitis") Pain Location: Other (Comment) (R hip, jaw, and neck) Pain Orientation: Right Pain Descriptors / Indicators: Aching Pain Onset: On-going Pain Intervention(s): Medication (See  eMAR);Emotional support;Distraction;Rest;Relaxation;Repositioned Multiple Pain Sites: Yes 2nd Pain Site Pain Type: Other (Comment) ("sinus congestion")  Prior Functioning Type of Home: House  Lives With: Alone;Other (Comment) (daughter (Tera) planning to move in) Available Help at Discharge: Family;Other (Comment) (pt's daughter owns a small business and cannot provide 24hr support - she works 4 days/week leaving around [employer] & getting home around 7:30PM) Vocation: Retired  Programmer, systems Overall Cognitive Status:  (pt/daughter endorse pt at cognitive baseline but with no history of prior cognitive impairment) Arousal/Alertness: Awake/alert Orientation Level: Oriented X4 Year: 2023 Month: June Day of Week: Correct Attention: Sustained Sustained Attention: Impaired Sustained Attention Impairment: Verbal basic Memory: Impaired Memory Impairment: Decreased recall of new information Awareness: Impaired Awareness Impairment: Intellectual impairment Problem Solving: Appears intact Safety/Judgment: Impaired  Comprehension   Expression   Oral Motor Oral Motor/Sensory Function Overall Oral Motor/Sensory Function: Mild impairment Facial ROM: Reduced left;Suspected CN VII (facial) dysfunction Facial Symmetry: Abnormal symmetry left Facial Strength: Reduced left;Suspected CN VII (facial) dysfunction Facial Sensation: Reduced left;Suspected CN V (Trigeminal) dysfunction Lingual ROM: Within Functional Limits Lingual Symmetry: Within Functional Limits Lingual Strength: Reduced Velum: Within Functional Limits Mandible: Within Functional Limits Motor Speech Overall Motor Speech: Impaired Respiration: Within functional limits Level of Impairment: Conversation Phonation: Normal Resonance: Within functional limits Articulation: Impaired Level of Impairment: Conversation Intelligibility: Intelligible Word: 75-100% accurate Phrase: 75-100% accurate Sentence: 75-100%  accurate Motor Planning: Witnin functional limits  Care Tool Care Tool Cognition Ability to hear (with hearing aid or hearing appliances if normally used Ability to hear (with hearing aid or hearing appliances if normally used): 0. Adequate - no difficulty in normal conservation, social interaction, listening to TV   Expression of Ideas and Wants Expression of Ideas and Wants: 4. Without difficulty (complex and basic) - expresses complex messages without difficulty and with speech that is clear and easy to understand   Understanding Verbal and Non-Verbal Content Understanding Verbal and Non-Verbal Content: 3. Usually understands - understands most conversations, but misses some part/intent of message. Requires cues at times to understand  Memory/Recall Ability Memory/Recall Ability : That he or she is  in a hospital/hospital unit;Current season   PMSV Assessment  PMSV Trial Intelligibility: Intelligible Word: 75-100% accurate Phrase: 75-100% accurate Sentence: 75-100% accurate  Bedside Swallowing Assessment General Date of Onset: 09/25/21 Previous Swallow Assessment: MBS 6/16 Diet Prior to this Study: Dysphagia 3 (soft);Thin liquids Temperature Spikes Noted: No Respiratory Status: Room air History of Recent Intubation: Yes Length of Intubations (days): 2 days Date extubated: 09/27/21 Behavior/Cognition: Alert;Cooperative;Pleasant mood Oral Cavity - Dentition: Adequate natural dentition Self-Feeding Abilities: Needs assist;Needs set up Vision: Impaired for self-feeding Patient Positioning: Upright in bed Baseline Vocal Quality: Normal Volitional Cough: Strong Volitional Swallow: Able to elicit  Oral Care Assessment Oral Assessment  (WDL): Exceptions to WDL Lips: Asymmetrical;Pink Teeth: Intact Tongue: Pink;Moist;Coated white Mucous Membrane(s): Moist;Pink Saliva: Moist, saliva free flowing Level of Consciousness: Alert Is patient on any of following O2 devices?: None of the  above Nutritional status: Dysphagia Oral Assessment Risk : High Risk Ice Chips Ice chips: Not tested Thin Liquid Thin Liquid: Impaired Presentation: Straw Pharyngeal  Phase Impairments: Cough - Delayed Nectar Thick Nectar Thick Liquid: Not tested Honey Thick Honey Thick Liquid: Not tested Puree Puree: Impaired Presentation: Spoon Oral Phase Impairments: Reduced labial seal Oral Phase Functional Implications: Left anterior spillage Solid Solid: Impaired Oral Phase Impairments: Impaired mastication BSE Assessment Risk for Aspiration Impact on safety and function: Mild aspiration risk;Moderate aspiration risk Other Related Risk Factors: Deconditioning;Decreased respiratory status  Short Term Goals: Week 1: SLP Short Term Goal 1 (Week 1): Patient will participate in further cognitive-linguistic evaluation SLP Short Term Goal 2 (Week 1): Patient will consume current diet with minimal s/sx of aspiration and sup A for implementation of aspiration precautions SLP Short Term Goal 3 (Week 1): Patient will consume regular texture PO trials and demonstrate effective mastication, oral clearance, and without s/sx of aspiration across 2 sessions prior to diet advancement  Refer to Care Plan for Long Term Goals  Recommendations for other services: Therapeutic Recreation  Kitchen group  Discharge Criteria: Patient will be discharged from SLP if patient refuses treatment 3 consecutive times without medical reason, if treatment goals not met, if there is a change in medical status, if patient makes no progress towards goals or if patient is discharged from hospital.  The above assessment, treatment plan, treatment alternatives and goals were discussed and mutually agreed upon: by patient and by family  Patty Sermons 10/02/2021, 5:43 PM

## 2021-10-02 NOTE — Progress Notes (Signed)
In regards to patient's anticoagulation plan is to continue to hold Eliquis/Xarelto with follow-up CT of the head in approximately 2 weeks which would be 10/12/2021 and discussed with neurology services on plans on restarting anticoagulation at that time.

## 2021-10-02 NOTE — Progress Notes (Signed)
Inpatient Rehabilitation Care Coordinator Assessment and Plan Patient Details  Name: Charlotte Hunter MRN: [id number] Date of Birth: 1955-06-09  Today's Date: 10/02/2021  Hospital Problems: Principal Problem:   ICH (intracerebral hemorrhage) [facility])  Past Medical History:  Past Medical History:  Diagnosis Date   A-fib [facility])    Aortic insufficiency    Hypertension    Past Surgical History: History reviewed. No pertinent surgical history. Social History:  reports that she has never smoked. She has never used smokeless tobacco. She reports current alcohol use of about 1.0 standard drink of alcohol per week. She reports that she does not use drugs.  Family / Support Systems Children: [name] Anticipated Caregiver: Johnette Abraham (Daughter) Ability/Limitations of Caregiver: intermittent Family Dynamics: support from daughter  Social History Preferred language: English Religion:  Health Literacy - How often do you need to have someone help you when you read instructions, pamphlets, or other written material from your doctor or pharmacy?: Never Writes: Yes   Abuse/Neglect Abuse/Neglect Assessment Can Be Completed: Yes Physical Abuse: Denies Verbal Abuse: Denies Sexual Abuse: Denies Exploitation of patient/patient's resources: Denies Self-Neglect: Denies  Patient response to: Social Isolation - How often do you feel lonely or isolated from those around you?: Never  Emotional Status Recent Psychosocial Issues: coping Psychiatric History: n/a Substance Abuse History: n/a  Patient / Family Perceptions, Expectations & Goals Pt/Family understanding of illness & functional limitations: yes Premorbid pt/family roles/activities: Patient previously independent and driving Anticipated changes in roles/activities/participation: daughter able to provide intermittent assistance Pt/family expectations/goals: [employer]: None Premorbid Home  Care/DME Agencies: None Transportation available at discharge: family able to transport Is the patient able to respond to transportation needs?: Yes In the past 12 months, has lack of transportation kept you from medical appointments or from getting medications?: No In the past 12 months, has lack of transportation kept you from meetings, work, or from getting things needed for daily living?: No  Discharge Planning Living Arrangements: Alone Support Systems: Children Type of Residence: Private residence Insurance Resources: Medicare Financial Screen Referred: No Money Management: Patient Does the patient have any problems obtaining your medications?: Yes (Describe) (COST) Home Management: Independent Patient/Family Preliminary Plans: [place] to assist if patient unable to manage Care Coordinator Barriers to Discharge: Lack of/limited family support, Decreased caregiver support, Neurogenic Bowel & Bladder, IV antibiotics Care Coordinator Anticipated Follow Up Needs: HH/OP DC Planning Additional Notes/Comments: potenital SNF daughter being unable to provide 24/7 supervision Expected length of stay: 16-18 Days  Clinical Impression SW met with patient. Introduced self and explained role. Made attempt to call patient daughter to confirm 24/7 supervision at discharge. Sw will continue to follow up.  Dyanne Iha 10/02/2021, 11:56 AM

## 2021-10-02 NOTE — Evaluation (Signed)
Physical Therapy Assessment and Plan  Patient Details  Name: Charlotte Hunter MRN: [id number] Date of Birth: [birth year]  PT Diagnosis: Abnormal posture, Abnormality of gait, Cognitive deficits, Difficulty walking, Hemiplegia non-dominant, Hypotonia, Impaired cognition, Impaired sensation, Muscle weakness, Pain in R hip and knee, and Paralysis Rehab Potential: Fair ELOS: 4 weeks   Today's Date: 10/02/2021 PT Individual Time: 4098-1191 PT Individual Time Calculation (min): 76 min    Hospital Problem: Principal Problem:   ICH (intracerebral hemorrhage) ([place])   Past Medical History:  Past Medical History:  Diagnosis Date   A-fib [facility])    Aortic insufficiency    Hypertension    Past Surgical History: History reviewed. No pertinent surgical history.  Assessment & Plan Clinical Impression: Patient is a 66 y.o. year old right-handed female with history of hypertension, recurrent UTIs maintained on prophylactic Macrodantin, aortic insufficiency, PAF with history of ablation maintained on Eliquis.  Her latest cardiology notes that she was planning Xarelto to see if this may be more affordable for her however it was never started and she maintained Eliquis.  Per chart review patient lives alone.  1 level home 4 steps to entry.  Independent prior to admission.  Daughter is planning to stay with her on discharge to provide assistance as needed.  Presented 09/25/2021 with acute onset of left-sided weakness as well as blurred vision.  CT of the head showed hemorrhage centered in the right thalamus and basal ganglia with mild surrounding edema, minimal midline shift, and a small amount of intraventricular extension into the third and fourth ventricle.  Patient was reversed with Andexxa.  Follow-up MRI/MRA unchanged size and morphology of acute intraparenchymal hemorrhage.  No hydrocephalus or trapping.  There was noted focal severe distal left P2 stenosis on MRA.  Admission chemistries unremarkable except  potassium 3.2, urinalysis negative.  Echocardiogram with ejection fraction of 65 to 70% no wall motion abnormalities.  She did require short-term intubation through 09/27/2021.  Currently maintained on a mechanical soft nectar thick liquid diet and advanced to thin liquids 10/01/2021.  Therapy evaluations completed due to patient decreased functional mobility left-sided weakness was admitted for a comprehensive rehab program.  Patient transferred to CIR on 10/01/2021 .   Patient currently requires total assist with mobility secondary to muscle weakness and muscle paralysis, decreased cardiorespiratoy endurance, impaired timing and sequencing, abnormal tone, unbalanced muscle activation, and decreased motor planning, diplopia, decreased midline orientation and decreased attention to left, decreased attention, decreased awareness, decreased problem solving, decreased safety awareness, decreased memory, and delayed processing, and decreased sitting balance, decreased standing balance, decreased postural control, hemiplegia, and decreased balance strategies.  Prior to hospitalization, patient was independent  with mobility and lived with Alone, Other (Comment) (Daughter is moving in) in a House home.  Home access is 4Stairs to enter.  Patient will benefit from skilled PT intervention to maximize safe functional mobility, minimize fall risk, and decrease caregiver burden for planned discharge home with 24 hour assist.  Anticipate patient will benefit from follow up [facility] at discharge.  PT - End of Session Activity Tolerance: Tolerates 30+ min activity with multiple rests Endurance Deficit: Yes Endurance Deficit Description: significant fatigue and pain limitations PT Assessment Rehab Potential (ACUTE/IP ONLY): Fair PT Barriers to Discharge: [place] home environment;Decreased caregiver support;Home environment access/layout;Incontinence;Neurogenic Bowel & Bladder;Lack of/limited family support PT Patient  demonstrates impairments in the following area(s): Balance;Safety;Behavior;Sensory;Edema;Skin Integrity;Endurance;Motor;Nutrition;Pain;Perception PT Transfers Functional Problem(s): Bed Mobility;Bed to Chair;Car;Furniture PT Locomotion Functional Problem(s): Ambulation;Wheelchair Mobility;Stairs PT Plan PT Intensity: Minimum of 1-2 x/day ,  45 to 90 minutes PT Frequency: 5 out of 7 days PT Duration Estimated Length of Stay: 4 weeks PT Treatment/Interventions: Ambulation/gait training;Community reintegration;DME/adaptive equipment instruction;Neuromuscular re-education;Psychosocial support;Stair training;UE/LE Strength taining/ROM;Wheelchair propulsion/positioning;Balance/vestibular training;Discharge planning;Functional electrical stimulation;Pain management;Skin care/wound management;UE/LE Coordination activities;Therapeutic Activities;Cognitive remediation/compensation;Disease management/prevention;Functional mobility training;Patient/family education;Splinting/orthotics;Therapeutic Exercise;Visual/perceptual remediation/compensation PT Transfers Anticipated Outcome(s): min assist using LRAD PT Locomotion Anticipated Outcome(s): min assist using LRAD PT Recommendation Recommendations for Other Services: Neuropsych consult Follow Up Recommendations: Home health PT;24 hour supervision/assistance Patient destination: Home Equipment Recommended: To be determined   PT Evaluation Precautions/Restrictions Precautions Precautions: Fall Precaution Comments: L hemiplegia, L inattention, R hip and knee pain Restrictions Weight Bearing Restrictions: No Pain  Pain Assessment Pain Scale: 0-10 Pain Score: 7  Pain Type: Chronic pain (chronic R hip pain from "bursitis") Pain Location: Other (Comment) (R hip, jaw, and neck) Pain Orientation: Right Pain Descriptors / Indicators: Aching Pain Onset: On-going Pain Intervention(s): Medication (See eMAR);Emotional  support;Distraction;Rest;Relaxation;Repositioned Multiple Pain Sites: Yes 2nd Pain Site Pain Type: Other (Comment) ("sinus congestion") Pain Interference Pain Interference Pain Effect on Sleep: 2. Occasionally Pain Interference with Therapy Activities: 4. Almost constantly Pain Interference with Day-to-Day Activities: 4. Almost constantly Home Living/Prior Functioning Home Living Available Help at Discharge: Family;Other (Comment) (pt's daughter owns a small business in [place] area and cannot provide 24hr support - she works 4 days/week leaving around [employer] & getting home around 7:30PM) Type of Home: House Home Access: Stairs to enter [employer] of Steps: 4 Entrance Stairs-Rails:  (1 railing in the middle) Home Layout: One level Bathroom Accessibility: Yes  Lives With: Alone;Other (Comment) (daughter (Tera) planning to move in) Prior Function Level of Independence: Independent with gait;Independent with transfers;Independent with homemaking with ambulation  Able to Take Stairs?: Yes Driving: Yes Vocation: Retired Vision/Perception   Vision - History Ability to See in Adequate Light: 1 Impaired Vision - Assessment Eye Alignment: Impaired (comment) (eyes seem slightly misaligned from one another) Ocular Range of Motion: Within Functional Limits Tracking/Visual Pursuits: Other (comment) (overall decreased smoothness of tracking with pt reporting burning pain in R eye) Perception Perception: Impaired Inattention/Neglect: Does not attend to left side of body;Does not attend to left visual field Spatial Orientation: impaired midline orientation when upright Praxis Praxis: Impaired Praxis Impairment Details: Motor planning  Cognition Overall Cognitive Status: Impaired/Different from baseline Arousal/Alertness: Awake/alert Orientation Level: Oriented X4 Year: 2023 Month: June Day of Week: Correct Attention: Focused;Sustained;Selective Focused Attention: Appears  intact Sustained Attention: Impaired Sustained Attention Impairment: Verbal basic Selective Attention: Impaired Memory: Impaired Memory Impairment: Decreased recall of new information Awareness: Impaired Safety/Judgment: Impaired Sensation Sensation Light Touch: Impaired Detail Central sensation comments: unable to feel light touch in L LE but does have pain response to deep pressure Light Touch Impaired Details: Impaired LLE Hot/Cold: Not tested Proprioception: Impaired Detail Proprioception Impaired Details: Impaired LLE Stereognosis: Not tested Additional Comments: dense L hemiplegia with paresthesias Coordination Gross Motor Movements are Fluid and Coordinated: No Coordination and Movement Description: dense L hemipleais with paresthesias and impaired midline orientation Motor  Motor Motor: Hemiplegia;Abnormal tone;Abnormal postural alignment and control Motor - Skilled Clinical Observations: dense L hemiplegia with hypotonia/flaccidity with paresthesias   Trunk/Postural Assessment  Cervical Assessment Cervical Assessment: Exceptions to [facility] Thoracic Assessment Thoracic Assessment: Exceptions to [facility] (thoracic rounding with shoulders protracted and L lean) Lumbar Assessment Lumbar Assessment: Exceptions to [facility] (posterior pelvic tilt in sitting) Postural Control Postural Control: Deficits on evaluation Trunk Control: L trunk lean/LOB with delayed and inadequate balance recovery strategies Righting Reactions: delayed and inadequate for  even minor LOBs Protective Responses: delayed and inadequate for even minor LOBs  Balance Balance Balance Assessed: Yes Static Sitting Balance Static Sitting - Balance Support: Feet supported Static Sitting - Level of Assistance: 2: Max assist Dynamic Sitting Balance Dynamic Sitting - Balance Support: Feet supported;Right upper extremity supported Dynamic Sitting - Level of Assistance: 2: Max assist Static Standing Balance Static  Standing - Balance Support: During functional activity;Right upper extremity supported Static Standing - Level of Assistance: 1: +1 Total assist;Other (comment) (in stedy) Extremity Assessment  RLE Assessment RLE Assessment: Exceptions to [facility] Active Range of Motion (AROM) Comments: WFL with some pain in R hip (chronic) General Strength Comments: assessed in supine RLE Strength Right Hip Flexion: 3+/5 (pain limited) Right Knee Flexion: 4/5 Right Knee Extension: 4/5 (pain limited) Right Ankle Dorsiflexion: 4+/5 Right Ankle Plantar Flexion: 4+/5 LLE Assessment LLE Assessment: Exceptions to [facility] Passive Range of Motion (PROM) Comments: L ankle resting in significant plantarflexion but is able to achieve neutral alignment with passive movement with pt reporting pain/discomfort with this (pt with PRAFO in room already and educated pt/daughter on wear schedule) General Strength Comments: assessed in supine LLE Strength Left Hip Flexion: 0/5 Left Knee Flexion: 0/5 Left Knee Extension: 0/5 Left Ankle Dorsiflexion: 0/5 Left Ankle Plantar Flexion: 0/5 LLE Tone LLE Tone: Hypotonic;Flaccid  Care Tool Care Tool Bed Mobility Roll left and right activity   Roll left and right assist level: Maximal Assistance - Patient 25 - 49%    Sit to lying activity   Sit to lying assist level: Maximal Assistance - Patient 25 - 49%    Lying to sitting on side of bed activity   Lying to sitting on side of bed assist level: the ability to move from lying on the back to sitting on the side of the bed with no back support.: Maximal Assistance - Patient 25 - 49%     Care Tool Transfers Sit to stand transfer   Sit to stand assist level: Total Assistance - Patient < 25%    Chair/bed transfer   Chair/bed transfer assist level: Dependent - mechanical lift     Toilet transfer   Assist Level: Dependent - Patient 0%    Car transfer Car transfer activity did not occur: Safety/medical concerns        Care Tool  Locomotion Ambulation Ambulation activity did not occur: Safety/medical concerns        Walk 10 feet activity Walk 10 feet activity did not occur: Safety/medical concerns       Walk 50 feet with 2 turns activity Walk 50 feet with 2 turns activity did not occur: Safety/medical concerns      Walk 150 feet activity Walk 150 feet activity did not occur: Safety/medical concerns      Walk 10 feet on uneven surfaces activity Walk 10 feet on uneven surfaces activity did not occur: Safety/medical concerns      Stairs Stair activity did not occur: Safety/medical concerns        Walk up/down 1 step activity Walk up/down 1 step or curb (drop down) activity did not occur: Safety/medical concerns      Walk up/down 4 steps activity Walk up/down 4 steps activity did not occur: Safety/medical concerns      Walk up/down 12 steps activity Walk up/down 12 steps activity did not occur: Safety/medical concerns      Pick up small objects from floor Pick up small object from the floor (from standing position) activity did not occur: Safety/medical  concerns      Wheelchair Is the patient using a wheelchair?: Yes Type of Wheelchair: Manual (TIS) Wheelchair activity did not occur: Safety/medical concerns      Wheel 50 feet with 2 turns activity Wheelchair 50 feet with 2 turns activity did not occur: Safety/medical concerns    Wheel 150 feet activity Wheelchair 150 feet activity did not occur: Safety/medical concerns      Refer to Care Plan for Long Term Goals  SHORT TERM GOAL WEEK 1 PT Short Term Goal 1 (Week 1): Pt will perform supine<>sit with mod assist PT Short Term Goal 2 (Week 1): Pt will perform sit<>stands using LRAD with max assist PT Short Term Goal 3 (Week 1): Pt will perform bed<>chair transfers using LRAD with max assist PT Short Term Goal 4 (Week 1): Pt will participate in gait training of 40f using +2 assist and lift equipment as necessary to ensure pt  safety  Recommendations for other services: Neuropsych  SMcGrath PUtahcleared pt to participate in therapy evaluation.  Pt received supine in bed with her daughter, Tera, present and pt agreeable to therapy session. [facility] having had I&O cath approximately 355mutes prior to therapist arrival. Evaluation completed (see details above) with patient/family education regarding purpose of PT evaluation, PT POC and goals, therapy schedule, weekly team meetings, and other CIR information including safety plan and fall risk safety.Pt demonstrates severe L hemiplegia with hypotonia/flaccidity as well as L inattention and impaired sensation. Pt completed the below functional mobility tasks with the specified levels of skilled assistance and multi-modal cuing on step-by-step sequencing with dependent assist for L hemibody management. At end of session, pt left seated on BSC due to requiring additional time with needs in reach and nurse present to assume care of patient.  Mobility Bed Mobility Bed Mobility: Supine to Sit;Sit to Supine Supine to Sit: Maximal Assistance - Patient - Patient 25-49% Sit to Supine: Maximal Assistance - Patient 25-49% Transfers Transfers: Sit to Stand;Stand to Sit;Transfer via LiBuckmano Stand: Maximal Assistance - Patient 25-49%;Total Assistance - Patient < 25% Stand to Sit: Maximal Assistance - Patient 25-49%;Total Assistance - Patient < 25% Transfer (Assistive device): Other (Comment) Transfer via Lift Equipment: StAnimal nutritionistNo Gait Gait: No Stairs / Additional Locomotion Stairs: No WhProduct managerobility: No (pt currently utilizing TIS wheelchair for pressure relief and trunk control)   Discharge Criteria: Patient will be discharged from PT if patient refuses treatment 3 consecutive times without medical reason, if treatment goals not  met, if there is a change in medical status, if patient makes no progress towards goals or if patient is discharged from hospital.  The above assessment, treatment plan, treatment alternatives and goals were discussed and mutually agreed upon: by patient and by family  CaTawana Scale PT, DPT, NCS, CSRS 10/02/2021, 3:32 PM

## 2021-10-02 NOTE — Plan of Care (Signed)
  Problem: RH Balance Goal: LTG Patient will maintain dynamic sitting balance (PT) Description: LTG:  Patient will maintain dynamic sitting balance with assistance during mobility activities (PT) Flowsheets (Taken 10/02/2021 1846) LTG: Pt will maintain dynamic sitting balance during mobility activities with:: Supervision/Verbal cueing Goal: LTG Patient will maintain dynamic standing balance (PT) Description: LTG:  Patient will maintain dynamic standing balance with assistance during mobility activities (PT) Flowsheets (Taken 10/02/2021 1846) LTG: Pt will maintain dynamic standing balance during mobility activities with:: Minimal Assistance - Patient > 75%   Problem: Sit to Stand Goal: LTG:  Patient will perform sit to stand with assistance level (PT) Description: LTG:  Patient will perform sit to stand with assistance level (PT) Flowsheets (Taken 10/02/2021 1846) LTG: PT will perform sit to stand in preparation for functional mobility with assistance level: Minimal Assistance - Patient > 75%   Problem: RH Bed Mobility Goal: LTG Patient will perform bed mobility with assist (PT) Description: LTG: Patient will perform bed mobility with assistance, with/without cues (PT). Flowsheets (Taken 10/02/2021 1846) LTG: Pt will perform bed mobility with assistance level of: Minimal Assistance - Patient > 75%   Problem: RH Bed to Chair Transfers Goal: LTG Patient will perform bed/chair transfers w/assist (PT) Description: LTG: Patient will perform bed to chair transfers with assistance (PT). Flowsheets (Taken 10/02/2021 1846) LTG: Pt will perform Bed to Chair Transfers with assistance level: Minimal Assistance - Patient > 75%   Problem: RH Car Transfers Goal: LTG Patient will perform car transfers with assist (PT) Description: LTG: Patient will perform car transfers with assistance (PT). Flowsheets (Taken 10/02/2021 1846) LTG: Pt will perform car transfers with assist:: Minimal Assistance - Patient >  75%   Problem: RH Ambulation Goal: LTG Patient will ambulate in controlled environment (PT) Description: LTG: Patient will ambulate in a controlled environment, # of feet with assistance (PT). Flowsheets (Taken 10/02/2021 1846) LTG: Pt will ambulate in controlled environ  assist needed:: Minimal Assistance - Patient > 75% LTG: Ambulation distance in controlled environment: 31ft using LRAD Goal: LTG Patient will ambulate in home environment (PT) Description: LTG: Patient will ambulate in home environment, # of feet with assistance (PT). Flowsheets (Taken 10/02/2021 1846) LTG: Pt will ambulate in home environ  assist needed:: Minimal Assistance - Patient > 75% LTG: Ambulation distance in home environment: 33ft using LRAD   Problem: RH Stairs Goal: LTG Patient will ambulate up and down stairs w/assist (PT) Description: LTG: Patient will ambulate up and down # of stairs with assistance (PT) Flowsheets (Taken 10/02/2021 1846) LTG: Pt will ambulate up/down stairs assist needed:: Minimal Assistance - Patient > 75% LTG: Pt will  ambulate up and down number of stairs: 4 steps using HR per home set-up

## 2021-10-02 NOTE — Plan of Care (Signed)
Problem: RH Balance Goal: LTG: Patient will maintain dynamic sitting balance (OT) Description: LTG:  Patient will maintain dynamic sitting balance with assistance during activities of daily living (OT) Flowsheets (Taken 10/02/2021 1229) LTG: Pt will maintain dynamic sitting balance during ADLs with: Supervision/Verbal cueing Goal: LTG Patient will maintain dynamic standing with ADLs (OT) Description: LTG:  Patient will maintain dynamic standing balance with assist during activities of daily living (OT)  Flowsheets (Taken 10/02/2021 1229) LTG: Pt will maintain dynamic standing balance during ADLs with: Minimal Assistance - Patient > 75%   Problem: Sit to Stand Goal: LTG:  Patient will perform sit to stand in prep for activites of daily living with assistance level (OT) Description: LTG:  Patient will perform sit to stand in prep for activites of daily living with assistance level (OT) Flowsheets (Taken 10/02/2021 1229) LTG: PT will perform sit to stand in prep for activites of daily living with assistance level: Supervision/Verbal cueing   Problem: RH Eating Goal: LTG Patient will perform eating w/assist, cues/equip (OT) Description: LTG: Patient will perform eating with assist, with/without cues using equipment (OT) Flowsheets (Taken 10/02/2021 1229) LTG: Pt will perform eating with assistance level of: Set up assist    Problem: RH Grooming Goal: LTG Patient will perform grooming w/assist,cues/equip (OT) Description: LTG: Patient will perform grooming with assist, with/without cues using equipment (OT) Flowsheets (Taken 10/02/2021 1229) LTG: Pt will perform grooming with assistance level of: Set up assist    Problem: RH Bathing Goal: LTG Patient will bathe all body parts with assist levels (OT) Description: LTG: Patient will bathe all body parts with assist levels (OT) Flowsheets (Taken 10/02/2021 1229) LTG: Pt will perform bathing with assistance level/cueing: Minimal Assistance -  Patient > 75%   Problem: RH Dressing Goal: LTG Patient will perform upper body dressing (OT) Description: LTG Patient will perform upper body dressing with assist, with/without cues (OT). Flowsheets (Taken 10/02/2021 1229) LTG: Pt will perform upper body dressing with assistance level of: Minimal Assistance - Patient > 75% Goal: LTG Patient will perform lower body dressing w/assist (OT) Description: LTG: Patient will perform lower body dressing with assist, with/without cues in positioning using equipment (OT) Flowsheets (Taken 10/02/2021 1229) LTG: Pt will perform lower body dressing with assistance level of: Moderate Assistance - Patient 50 - 74%   Problem: RH Toileting Goal: LTG Patient will perform toileting task (3/3 steps) with assistance level (OT) Description: LTG: Patient will perform toileting task (3/3 steps) with assistance level (OT)  Flowsheets (Taken 10/02/2021 1229) LTG: Pt will perform toileting task (3/3 steps) with assistance level: Moderate Assistance - Patient 50 - 74%   Problem: RH Vision Goal: RH LTG Vision Consulting civil engineer) Flowsheets (Taken 10/02/2021 1229) LTG: Vision Goals: Pt will incorporate compensatory strategies (e.g., closing one eye, eye patch, etc.)  to overcome diplopia/blurry vision to locate all desired ADL items during morning routine with indepenence.   Problem: RH Toilet Transfers Goal: LTG Patient will perform toilet transfers w/assist (OT) Description: LTG: Patient will perform toilet transfers with assist, with/without cues using equipment (OT) Flowsheets (Taken 10/02/2021 1229) LTG: Pt will perform toilet transfers with assistance level of: Minimal Assistance - Patient > 75%   Problem: RH Tub/Shower Transfers Goal: LTG Patient will perform tub/shower transfers w/assist (OT) Description: LTG: Patient will perform tub/shower transfers with assist, with/without cues using equipment (OT) Flowsheets (Taken 10/02/2021 1229) LTG: Pt will perform tub/shower  stall transfers with assistance level of: Minimal Assistance - Patient > 75%   Problem: RH Pre-functional/Other (Specify) Goal:  RH LTG OT (Specify) 1 Description: RH LTG OT (Specify) 1 Flowsheets (Taken 10/02/2021 1229) LTG: Other OT (Specify) 1: Pt will position LUE therapeutically in chair/bed with independence.

## 2021-10-03 LAB — URINALYSIS, ROUTINE W REFLEX MICROSCOPIC
Bilirubin Urine: NEGATIVE
Glucose, UA: NEGATIVE mg/dL
Ketones, ur: NEGATIVE mg/dL
Nitrite: NEGATIVE
Protein, ur: 100 mg/dL — AB
Specific Gravity, Urine: 1.006 (ref 1.005–1.030)
WBC, UA: >50 WBC/hpf — ABNORMAL HIGH (ref 0–5)
pH: 7 (ref 5.0–8.0)

## 2021-10-03 MED ORDER — MINERAL OIL PO OIL
TOPICAL_OIL | Freq: Once | ORAL | Status: AC
  Administered 2021-10-03: 30 mL via ORAL
  Filled 2021-10-03: qty 30

## 2021-10-03 MED ORDER — BUSPIRONE HCL 5 MG PO TABS
5.0000 mg | ORAL_TABLET | Freq: Two times a day (BID) | ORAL | Status: DC
Start: 2021-10-03 — End: 2021-10-06
  Administered 2021-10-03 – 2021-10-06 (×7): 5 mg via ORAL
  Filled 2021-10-03 (×8): qty 1

## 2021-10-03 MED ORDER — TAMSULOSIN HCL 0.4 MG PO CAPS
0.4000 mg | ORAL_CAPSULE | Freq: Every day | ORAL | Status: DC
  Administered 2021-10-03 – 2021-10-12 (×9): 0.4 mg via ORAL
  Filled 2021-10-03 (×10): qty 1

## 2021-10-03 NOTE — NC FL2 (Cosign Needed)
[place] MEDICAID FL2 LEVEL OF CARE SCREENING TOOL     IDENTIFICATION  Patient Name: Charlotte Hunter Birthdate: Jul 10, 1955 Sex: female Admission Date (Current Location): 10/01/2021  [facility] and [place] Number:  Chiropodist and Address:         Provider Number:    Attending Physician Name and Address:  Erick Colace, MD  Relative Name and Phone Number:  Verlene Mayer [phone]    Current Level of Care: Hospital Recommended Level of Care: Skilled Nursing Facility Prior Approval Number:    Date Approved/Denied:   PASRR Number: [id number]  Discharge Plan: SNF    Current Diagnoses: Patient Active Problem List   Diagnosis Date Noted   ICH (intracerebral hemorrhage) ([name]) 10/01/2021   Paroxysmal atrial fibrillation ([name]) 09/29/2021   Stenosis of intracranial vessel 09/29/2021   Essential hypertension 09/29/2021   Blurry vision 09/29/2021   GERD (gastroesophageal reflux disease) 09/29/2021   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 09/25/2021    Orientation RESPIRATION BLADDER Height & Weight     Self, Time, Situation, Place    Incontinent Weight: 184 lb 8.4 oz (83.7 kg) Height:     BEHAVIORAL SYMPTOMS/MOOD NEUROLOGICAL BOWEL NUTRITION STATUS      Incontinent Diet  AMBULATORY STATUS COMMUNICATION OF NEEDS Skin   Limited Assist Verbally Normal                       Personal Care Assistance Level of Assistance  Bathing, Feeding, Dressing Bathing Assistance: Limited assistance Feeding assistance: Limited assistance Dressing Assistance: Limited assistance     Functional Limitations Info             SPECIAL CARE FACTORS FREQUENCY  PT (By licensed PT), OT (By licensed OT), Speech therapy, Bowel and bladder program     PT Frequency: 5 x a week OT Frequency: 5x a week     Speech Therapy Frequency: 5x a week      Contractures      Additional Factors Info  Code Status Code Status Info: Full             Current  Medications (10/03/2021):  This is the current hospital active medication list Current Facility-Administered Medications  Medication Dose Route Frequency Provider Last Rate Last Admin   acetaminophen (TYLENOL) tablet 650 mg  650 mg Oral Q4H PRN Charlton Amor, PA-C   650 mg at 10/02/21 2105   Or   acetaminophen (TYLENOL) 160 MG/5ML solution 650 mg  650 mg Per Tube Q4H PRN Angiulli, [place] Rossetti, PA-C       Or   acetaminophen (TYLENOL) suppository 650 mg  650 mg Rectal Q4H PRN Angiulli, Mcarthur Rossetti, PA-C       busPIRone (BUSPAR) tablet 5 mg  5 mg Oral BID Erick Colace, MD   5 mg at 10/03/21 1238   gabapentin (NEURONTIN) capsule 100 mg  100 mg Oral BID Erick Colace, MD   100 mg at 10/03/21 0916   loratadine (CLARITIN) tablet 10 mg  10 mg Oral Daily PRN Angiulli, Mcarthur Rossetti, PA-C       losartan (COZAAR) tablet 50 mg  50 mg Oral Daily Charlton Amor, PA-C   50 mg at 10/03/21 8527   melatonin tablet 3 mg  3 mg Oral QHS AngiulliMcarthur Rossetti, PA-C   3 mg at 10/02/21 2105   methocarbamol (ROBAXIN) tablet 500 mg  500 mg Oral Q8H PRN Charlton Amor, PA-C   500 mg at  10/03/21 1238   metoprolol tartrate (LOPRESSOR) tablet 25 mg  25 mg Oral BID Charlton Amor, PA-C   25 mg at 10/03/21 6503   multivitamin with minerals tablet 1 tablet  1 tablet Oral Daily Charlton Amor, PA-C   1 tablet at 10/03/21 5465   Muscle Rub CREA   Topical PRN Charlton Amor, PA-C   Given at 10/02/21 6812   nitrofurantoin (MACRODANTIN) capsule 50 mg  50 mg Oral Daily Charlton Amor, PA-C   50 mg at 10/03/21 0916   ondansetron (ZOFRAN) tablet 4 mg  4 mg Oral Q8H PRN Charlton Amor, PA-C   4 mg at 10/02/21 1842   pantoprazole (PROTONIX) EC tablet 40 mg  40 mg Oral QHS Charlton Amor, PA-C   40 mg at 10/02/21 2105   polyethylene glycol (MIRALAX / GLYCOLAX) packet 17 g  17 g Oral BID Charlton Amor, PA-C   17 g at 10/03/21 7517   polyvinyl alcohol (LIQUIFILM TEARS) 1.4 % ophthalmic solution 1 drop   1 drop Both Eyes PRN AngiulliMcarthur Rossetti, PA-C   1 drop at 10/02/21 0604   rosuvastatin (CRESTOR) tablet 20 mg  20 mg Oral Daily Charlton Amor, PA-C   20 mg at 10/03/21 0017   senna-docusate (Senokot-S) tablet 1 tablet  1 tablet Oral BID Charlton Amor, PA-C   1 tablet at 10/03/21 4944   tamsulosin (FLOMAX) capsule 0.4 mg  0.4 mg Oral QPC supper Erick Colace, MD         Discharge Medications: Please see discharge summary for a list of discharge medications.  Relevant Imaging Results:  Relevant Lab Results:   Additional Information SS: [id number]  Andria Rhein

## 2021-10-03 NOTE — Progress Notes (Signed)
PROGRESS NOTE   Subjective/Complaints:  Discussed CT head as well as urinary retention and constipation   ROS- neg CP, SOB, N/V/D  Objective:   CT HEAD WO CONTRAST (5MM)  Result Date: 10/02/2021 CLINICAL DATA:  Provided history: Subdural hematoma, ICH. EXAM: CT HEAD WITHOUT CONTRAST TECHNIQUE: Contiguous axial images were obtained from the base of the skull through the vertex without intravenous contrast. RADIATION DOSE REDUCTION: This exam was performed according to the departmental dose-optimization program which includes automated exposure control, adjustment of the mA and/or kV according to patient size and/or use of iterative reconstruction technique. COMPARISON:  Head CT 09/25/2021.  MRI brain 09/27/2021. FINDINGS: Brain: No age advanced or lobar predominant parenchymal atrophy. Unchanged size of a parenchymal hemorrhage centered within the right thalamus, again measuring 3.4 x 1.8 x 2.3 cm (remeasured on prior). Surrounding edema has progressed from the prior head CTs of 09/25/2021, but is similar to the prior brain MRI of 09/27/2021. As before, there is local mass effect with partial effacement of the third ventricle. Previously demonstrated small-volume intraventricular hemorrhage has become less conspicuous and is no longer well appreciated. Mild patchy and ill-defined hypoattenuation within the cerebral white matter, nonspecific but compatible with chronic small vessel ischemic disease. No demarcated cortical infarct. No extra-axial fluid collection. Vascular: No hyperdense vessel. Atherosclerotic calcifications. Skull: No fracture or aggressive osseous lesion. Sinuses/Orbits: No mass or acute finding within the imaged orbits. Small mucous retention cysts within the left maxillary sinus at the imaged levels. IMPRESSION: 3.4 x 1.8 x 2.3 cm parenchymal hemorrhage centered within the right thalamus, unchanged in size. Surrounding edema has  progressed from the prior head CTs of 09/25/2021, but is similar to the prior brain MRI of 09/27/2021. As before, there is local mass effect with partial effacement of third ventricle. Previously demonstrated small-volume intraventricular hemorrhage has become less conspicuous and is no longer well appreciated. Electronically Signed   By: Kellie Simmering D.O.   On: 10/02/2021 12:34   Recent Labs    10/01/21 0408 10/02/21 0641  WBC 8.1 7.7  HGB 11.7* 11.9*  HCT 35.2* 36.2  PLT 244 268    Recent Labs    10/01/21 0408 10/02/21 0641  NA 137 140  K 3.7 3.8  CL 104 104  CO2 24 26  GLUCOSE 116* 117*  BUN 10 14  CREATININE 0.57 0.63  CALCIUM 9.3 9.5     Intake/Output Summary (Last 24 hours) at 10/03/2021 0928 Last data filed at 10/03/2021 1761 Gross per 24 hour  Intake 720 ml  Output 3600 ml  Net -2880 ml         Physical Exam: Vital Signs Blood pressure (!) 104/50, pulse 63, temperature 98.4 F (36.9 C), resp. rate 19, weight 83.7 kg, SpO2 92 %.  General: No acute distress Mood and affect are appropriate Heart: Regular rate and rhythm no rubs murmurs or extra sounds Lungs: Clear to auscultation, breathing unlabored, no rales or wheezes Abdomen: Positive bowel sounds, soft nontender to palpation, nondistended Extremities: No clubbing, cyanosis, or edema Skin: No evidence of breakdown, no evidence of rash Neurologic: Cranial nerves II through XII intact, motor strength is 5/5 in right and 0/5 left deltoid,  bicep, tricep, grip, hip flexor, knee extensors, ankle dorsiflexor and plantar flexor Sensory exam normal sensation to light touch and proprioception in right and absent left  upper and lower extremities  Musculoskeletal: Full range of motion in all 4 extremities. No joint swelling    Assessment/Plan: 1. Functional deficits which require 3+ hours per day of interdisciplinary therapy in a comprehensive inpatient rehab setting. Physiatrist is providing close team  supervision and 24 hour management of active medical problems listed below. Physiatrist and rehab team continue to assess barriers to discharge/monitor patient progress toward functional and medical goals  Care Tool:  Bathing    Body parts bathed by patient: Face   Body parts bathed by helper: Left arm, Right arm, Chest, Abdomen     Bathing assist Assist Level: Maximal Assistance - Patient 24 - 49%     Upper Body Dressing/Undressing Upper body dressing   What is the patient wearing?: Pull over shirt    Upper body assist Assist Level: Maximal Assistance - Patient 25 - 49%    Lower Body Dressing/Undressing Lower body dressing      What is the patient wearing?: Pants     Lower body assist Assist for lower body dressing: Total Assistance - Patient < 25%     Toileting Toileting Toileting Activity did not occur Landscape architect and hygiene only): N/A (no void or bm)  Toileting assist Assist for toileting: 2 Helpers     Transfers Chair/bed transfer  Transfers assist  Chair/bed transfer activity did not occur: Safety/medical concerns  Chair/bed transfer assist level: Dependent - mechanical lift     Locomotion Ambulation   Ambulation assist   Ambulation activity did not occur: Safety/medical concerns          Walk 10 feet activity   Assist  Walk 10 feet activity did not occur: Safety/medical concerns        Walk 50 feet activity   Assist Walk 50 feet with 2 turns activity did not occur: Safety/medical concerns         Walk 150 feet activity   Assist Walk 150 feet activity did not occur: Safety/medical concerns         Walk 10 feet on uneven surface  activity   Assist Walk 10 feet on uneven surfaces activity did not occur: Safety/medical concerns         Wheelchair     Assist Is the patient using a wheelchair?: Yes Type of Wheelchair: Manual (TIS) Wheelchair activity did not occur: Safety/medical concerns          Wheelchair 50 feet with 2 turns activity    Assist    Wheelchair 50 feet with 2 turns activity did not occur: Safety/medical concerns       Wheelchair 150 feet activity     Assist  Wheelchair 150 feet activity did not occur: Safety/medical concerns       Blood pressure (!) 104/50, pulse 63, temperature 98.4 F (36.9 C), resp. rate 19, weight 83.7 kg, SpO2 92 %.  Medical Problem List and Plan: 1. 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  Have neuro review to see if Xarelto can be started              -patient may  shower             -ELOS/Goals: 16-18d Sup/minA, Team conference today please see physician documentation  under team conference tab, met with team  to discuss problems,progress, and goals. Formulized individual treatment plan based on medical history, underlying problem and comorbidities.  2.  Antithrombotics: -DVT/anticoagulation:  Mechanical: Antiembolism stockings, thigh (TED hose) Bilateral lower extremities             -antiplatelet therapy: N/A 3. Pain Management: Tylenol as needed, has burning discomfort on Left side  Added Gabapentin for neurogenic pain  4. Mood/Sleep: Provide emotional support             -antipsychotic agents: N/A 5. Neuropsych/cognition: This patient is capable of making decisions on her own behalf. 6. Skin/Wound Care: Routine skin checks 7. Fluids/Electrolytes/Nutrition: Routine in and outs with follow-up chemistries 8.  Dysphagia.  Dysphagia #3 thin liquids 9.  Hypertension.  Cozaar 50 mg daily, Lopressor 25 mg twice daily.  Monitor with increased mobility Vitals:   10/02/21 1918 10/03/21 0320  BP: 112/60 (!) 104/50  Pulse: (!) 101 63  Resp: 18 19  Temp: 98.6 F (37 C) 98.4 F (36.9 C)  SpO2: 100% 92%    There were no vitals filed for this visit.  10.  PAF.  Eliquis on hold due to [place].  Continue  Lopressor.  Cardiac rate controlled. 11.  Obesity.  BMI 29.21.  Dietary follow-up 12.  History of recurrent UTI.  Prophylactic Macrodantin 50 mg daily.  30.  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  Lethargic with poor sitting balance yesterday , repeat CT head unchanged  15.  Constipation , problem at times at home, drank mineral oil and coffee to help LOS: 2 days A FACE TO FACE EVALUATION WAS PERFORMED  Charlett Blake 10/03/2021, 9:28 AM

## 2021-10-03 NOTE — Progress Notes (Addendum)
Recreational Therapy Session Note  Patient Details  Name: Charlotte Hunter MRN: [id number] Date of Birth: 08/08/1955 Today's Date: 10/03/2021  Pt participated in animal assisted activity bed level with supervision.  Pt easily engaged in conversation and interaction with pet partners team.   [name] 10/03/2021, 3:05 PM

## 2021-10-03 NOTE — Progress Notes (Signed)
Bladder scanned pt at 1424 and got 786 mL. Encouraged pt to get up to [facility] and try to urinate and pt refused. Went back at 1630 to bladder scan again and pt was at 806 mL. Pt refused being cathed. Pt was educated and voiced understanding of risks of refusal.  Jamilya Sarrazin B Yetta Barre

## 2021-10-03 NOTE — Progress Notes (Signed)
Speech Language Pathology Daily Session Note  Patient Details  Name: Charlotte Hunter MRN: [id number] Date of Birth: [birth year]  Today's Date: 10/03/2021 SLP Individual Time: 1450-1535 SLP Individual Time Calculation (min): 45 min  Short Term Goals: Week 1: SLP Short Term Goal 1 (Week 1): Patient will participate in further cognitive-linguistic evaluation SLP Short Term Goal 2 (Week 1): Patient will consume current diet with minimal s/sx of aspiration and sup A for implementation of aspiration precautions SLP Short Term Goal 3 (Week 1): Patient will consume regular texture PO trials and demonstrate effective mastication, oral clearance, and without s/sx of aspiration across 2 sessions prior to diet advancement  Skilled Therapeutic Interventions: Skilled ST treatment focused on swallowing goals. Pt received in TIS wheelchair on arrival. Pt requesting to use bathroom and then return to bed due to fatigue. Pt reporting generalized discomfort but there was not one identified location identified. Discomfort improved with re-positioning and once returned to bed at end of session. SLP facilitated transfer from w/c to [facility] with max A +1. Pt required extended time (15+ minutes) to void bladder. Pt eventually requiring mod A required for peri care. Pt became increasingly fatigued and required max +2 assist to stand; total A to pull up pants. Pt returned to bed with max A for bed mobility to elevate head/neck for pillow placement, rolling to L to place heating pad underneath R hip. It should be noted while waiting to urinate, pt requested several sips of thin liquids by straw without overt s/sx of aspiration. Pt exhibited delayed throat clear x1. Pt implemented small, single sips with sup A verbal cues. SLP reinforced aspiration precautions. Pt reports occasional coughing today however stated it was not attributed to eating/drinking, as she felt it was associated with "phlegm." SLP recommends continuation of thin  liquids with adherence to aspiration precautions. Patient was left in bed with alarm activated and immediate needs within reach at end of session. Continue per current plan of care.      Pain  See above  Therapy/Group: Individual Therapy  Tamala Ser 10/03/2021, 3:13 PM

## 2021-10-03 NOTE — Progress Notes (Signed)
Physical Therapy Session Note  Patient Details  Name: Charlotte Hunter MRN: [id number] Date of Birth: [birth year]  Today's Date: 10/03/2021 PT Individual Time: 5102-5852 PT Individual Time Calculation (min): 72 min   Short Term Goals: Week 1:  PT Short Term Goal 1 (Week 1): Pt will perform supine<>sit with mod assist PT Short Term Goal 2 (Week 1): Pt will perform sit<>stands using LRAD with max assist PT Short Term Goal 3 (Week 1): Pt will perform bed<>chair transfers using LRAD with max assist PT Short Term Goal 4 (Week 1): Pt will participate in gait training of 52ft using +2 assist and lift equipment as necessary to ensure pt safety  Skilled Therapeutic Interventions/Progress Updates:    Pt received supine in bed awake and agreeable to therapy session. Supine>sitting R EOB, HOB slightly elevated and using R UE support on bedrail to bring trunk upright, via logroll technique for increased pt independence with max assist for L hemibody management and trunk upright.   Sitting EOB with R UE support on bedrail and supervision with only occasional min assist for trunk balance while donning LB clothing and shoes total assist for time management. Pt requesting to use bathroom - +2 assist arriving.  L squat pivot EOB>BSC with max assist of 1 and +2 min/mod assist for lifting/pivoting hips and maintaining trunk control - step-by-step cuing for sequencing head/hips relationship - manual facilitation for L LE positioning and facilitation to increase WBing - +2 assist managing LB clothing. Continent of bladder. Sit>stand BSC>R UE support on bedrail with heavy mod assist of 1 for balance, blocking L knee during stance, and maintaining trunk upright while +2 assist provided dependent peri-care. +2 assist also moved [facility] and placed TIS w/c behind patient.   Transported to/from gym in w/c for time management and energy conservation.  Donned L LE ankle DF assist ace wrap and leg lifter to utilize for safe  facilitation of L LE gait mechanics.  Started to don litegait harness in sitting then rose to standing with heavy mod assist of 1 for balance and blocking L knee while +2 assist completed donning litegait harness.  Gait training 51ft + 63ft  in litegait harness providing partial BWS and utilizing L hip bunjee to facilitate forward hip progression and extension while +2 assist managed litegait and therapist providing max assist for L LE management as follows:  - pt able to advance L LE ~25-50% of the way inconsistently and this is successful because the BWS is allowing improved L foot clearance when pt activates hip flexors! - max/total assist to facilitate L hip/knee extension during stance phase with no true quad or glute muscle activation noted  Doffed harness as described above.  Sit>stand with R UE support on hallway rail with heavy mod assist of 1 for blocking L knee and facilitation hip extension and cuing for trunk extension to achieve upright posture. Planning to attempt gait training at R hallway rail; however, pt reporting too fatigued at this time.  Transported back to room and pt agreeable to remain sitting up in TIS w/c. Left seated with needs in reach, seat belt alarm on, and L UE supported on pillow.    Therapy Documentation Precautions:  Precautions Precautions: Fall, Other (comment) Precaution Comments: L hemiplegia, L inattention, R hip and knee pain Restrictions Weight Bearing Restrictions: No   Pain: Continues to have paresthesias with burning sensations in various parts of her body - responds well to redirection/distraction and when possible removing the tactile stimulus that is leading  to the altered sensory experience.    Therapy/Group: Individual Therapy  Ginny Forth , PT, DPT, NCS, CSRS 10/03/2021, 12:24 PM

## 2021-10-03 NOTE — Plan of Care (Signed)
  Problem: RH Swallowing Goal: LTG Patient will participate in dysphagia therapy to increase swallow function with assistance (SLP) Description: LTG:  Patient will participate in dysphagia therapy to increase swallow function with assistance (SLP) Flowsheets (Taken 10/03/2021 1052) LTG: Pt will participate in dysphagia therapy to increase swallow function with assistance of (SLP): Modified Independent

## 2021-10-03 NOTE — IPOC Note (Signed)
Overall Plan of Care [facility]) Patient Details Name: Charlotte Hunter MRN: [id number] DOB: 1955-06-16  Admitting Diagnosis: ICH (intracerebral hemorrhage) [facility])  Hospital Problems: Principal Problem:   ICH (intracerebral hemorrhage) ([name])     Functional Problem List: Nursing Bladder, Bowel, Safety, Medication Management, Pain  PT Balance, Safety, Behavior, Sensory, Edema, Skin Integrity, Endurance, Motor, Nutrition, Pain, Perception  OT Balance, Cognition, Endurance, Motor, Perception, Safety, Vision, Sensory, Pain  SLP Cognition, Nutrition, Motor  TR         Basic ADL's: OT Eating, Grooming, Bathing, Dressing, Toileting     Advanced  ADL's: OT       Transfers: PT Bed Mobility, Bed to Chair, Car, Occupational psychologist, Research scientist (life sciences): PT Ambulation, Psychologist, prison and probation services, Stairs     Additional Impairments: OT Fuctional Use of Upper Extremity  SLP Social Cognition, Swallowing, Communication expression Social Interaction, Attention, Awareness  TR      Anticipated Outcomes Item Anticipated Outcome  Self Feeding set-up a  Swallowing  mod I   Basic self-care  min to mod  Toileting  mod   Bathroom Transfers min  Bowel/Bladder  manage bowel and bladder w mod I assist  Transfers  min assist using LRAD  Locomotion  min assist using LRAD  Communication  mod I  Cognition  mod I  Pain  pain at or below level 4 w prns  Safety/Judgment  maintain safety w cues   Therapy Plan: PT Intensity: Minimum of 1-2 x/day ,45 to 90 minutes PT Frequency: 5 out of 7 days PT Duration Estimated Length of Stay: 4 weeks OT Intensity: Minimum of 1-2 x/day, 45 to 90 minutes OT Frequency: 5 out of 7 days OT Duration/Estimated Length of Stay: 4-4.5 weeks SLP Intensity: Minumum of 1-2 x/day, 30 to 90 minutes SLP Frequency: 1 to 3 out of 7 days SLP Duration/Estimated Length of Stay: 3.5-4 weeks - suspect shorter for SLP   Team Interventions: Nursing Interventions Bladder  Management, Disease Management/Prevention, Medication Management, Discharge Planning, Pain Management, Bowel Management, Patient/Family Education  PT interventions Ambulation/gait training, Community reintegration, DME/adaptive equipment instruction, Neuromuscular re-education, Psychosocial support, Stair training, UE/LE Strength taining/ROM, Wheelchair propulsion/positioning, Warden/ranger, Discharge planning, Functional electrical stimulation, Pain management, Skin care/wound management, UE/LE Coordination activities, Therapeutic Activities, Cognitive remediation/compensation, Disease management/prevention, Functional mobility training, Patient/family education, Splinting/orthotics, Therapeutic Exercise, Visual/perceptual remediation/compensation  OT Interventions Balance/vestibular training, Disease mangement/prevention, Neuromuscular re-education, Self Care/advanced ADL retraining, Therapeutic Exercise, Wheelchair propulsion/positioning, UE/LE Strength taining/ROM, Skin care/wound managment, Pain management, DME/adaptive equipment instruction, Cognitive remediation/compensation, Community reintegration, Functional electrical stimulation, Patient/family education, Splinting/orthotics, Visual/perceptual remediation/compensation, UE/LE Coordination activities, Therapeutic Activities, Functional mobility training, Psychosocial support, Discharge planning  SLP Interventions Cognitive remediation/compensation, Dysphagia/aspiration precaution training, Speech/Language facilitation, Functional tasks, Patient/family education  TR Interventions    SW/CM Interventions Discharge Planning, Psychosocial Support, Patient/Family Education, Disease Management/Prevention   Barriers to Discharge MD  Medical stability  Nursing Decreased caregiver support, Home environment access/layout 1 level 4 ste solo PTA; dtr to assist at d/c  PT Inaccessible home environment, Decreased caregiver support, Home  environment access/layout, Incontinence, Neurogenic Bowel & Bladder, Lack of/limited family support    OT Decreased caregiver support, Home environment access/layout, Inaccessible home environment    SLP Lack of/limited family support pt stated her daughter will be unable to provide consistent support at home  SW Lack of/limited family support, Decreased caregiver support, Neurogenic Bowel & Bladder, IV antibiotics     Team Discharge Planning: Destination: PT-Home ,OT- Home , SLP-Home Projected Follow-up: PT-Home health  PT, 24 hour supervision/assistance, OT-  24 hour supervision/assistance, Home health OT, SLP-24 hour supervision/assistance, Other (comment) (other TBD) Projected Equipment Needs: PT-To be determined, OT- To be determined, SLP-To be determined Equipment Details: PT- , OT-  Patient/family involved in discharge planning: PT- Patient, Family member/caregiver,  OT-Patient, SLP-Patient, Family member/caregiver  MD ELOS: 14d Medical Rehab Prognosis:  Fair Assessment: 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 The team will be addressing functional mobility, strength, stamina, balance, safety, adaptive techniques and equipment, self-care, bowel and bladder mgt, patient and caregiver education, Pain control , urine retention. Goals have been set at Mod A. Anticipated discharge destination is SNF.        See Team Conference Notes for weekly updates to the plan of care

## 2021-10-03 NOTE — Progress Notes (Signed)
Occupational Therapy Session Note  Patient Details  Name: Charlotte Hunter MRN: [id number] Date of Birth: 1955/07/03  Today's Date: 10/03/2021 OT Individual Time: 3151-7616 OT Individual Time Calculation (min): 74 min    Short Term Goals: Week 1:  OT Short Term Goal 1 (Week 1): Pt will maintain static sitting balance EOB with no more than min A for >5 min. OT Short Term Goal 2 (Week 1): Pt will complete >2 grooming tasks seated at sink with S. OT Short Term Goal 3 (Week 1): Pt will don shirt in supported sitting mod A. OT Short Term Goal 4 (Week 1): Pt will complete toilet transfer and LRAD with max A.  Skilled Therapeutic Interventions/Progress Updates:  Pt greeted supine in bed agreeable to OT intervention. Session focus on BADL reeducation, functional mobility, sitting balance and decreasing overall caregiver burden.   Pt wanting to problem solve toileting. Pt completed supine>sit to EOB to R side with MOD A +2. CGA - MIN A for static sitting balance from EOB. Pt able to stand to stedy with MOD A +2 with total A transport to BSC ( raised BSC to assist with sit>stand). Pt was able to void in toilet with LPN present to obtain urine sample. Pt able to complete UB bathing while sitting on toilet with MAX A, pt donned [place] shirt with MAX A and education provided on hemi techniques. Pt completed sit>stand with stedy with MOD A +1; total A transport back to bed with use of stedy. Pt needed MAX A for sit>supine needing assist to elevate BLEs back to bed. Pt able to assist with pulling pt up in bed using RUE to pull up. pt left supine in bed with bed alarm activated and all needs within reach.               Therapy Documentation Precautions:  Precautions Precautions: Fall, Other (comment) Precaution Comments: L hemiplegia, L inattention, R hip and knee pain Restrictions Weight Bearing Restrictions: No   Pain: unrated pain reported in R side of neck and LUE. Rest breaks, repositioning and heat  all used as pain mgmt.     Therapy/Group: Individual Therapy  Barron Schmid 10/03/2021, 12:13 PM

## 2021-10-03 NOTE — Patient Care Conference (Cosign Needed)
Inpatient RehabilitationTeam Conference and Plan of Care Update Date: 10/03/2021   Time: 10:48 AM    Patient Name: Charlotte Hunter      Medical Record Number: [id number]  Date of Birth: 01-18-1956 Sex: Female         Room/Bed: 4W20C/4W20C-01 Payor Info: Payor: MEDICARE / Plan: MEDICARE PART A AND B / Product Type: *No Product type* /    Admit Date/Time:  10/01/2021  3:35 PM  Primary Diagnosis:  ICH (intracerebral hemorrhage) [facility])  Hospital Problems: Principal Problem:   ICH (intracerebral hemorrhage) [facility])    Expected Discharge Date: Expected Discharge Date: 10/17/21 (SNF pending)  Team Members Present: Physician leading conference: Dr. Claudette Laws Social Worker Present: Lavera Guise, BSW Nurse Present: Chana Bode, RN PT Present: Casimiro Needle, PT OT Present: Roney Mans, OT;Mary Felipe Drone, COTA SLP Present: Eilene Ghazi, SLP PPS Coordinator present : Fae Pippin, SLP     Current Status/Progress Goal Weekly Team Focus  Bowel/Bladder     Incontinent; UA+C   continent   Toileting  Swallow/Nutrition/ Hydration   sup A  mod I  dys 3/thin tolerance, regular textures PO trials   ADL's   MAX A overall for ADLs, flaccid LUE, MOD A +2 to stand to stedy today, total A transfer to [facility]. CGA for static sitting balance EOB  MIN A- supervision  BADL reeducation, transfer training, balance training, functional mobility, LUE NMR   Mobility   max assist supine<>sit, max assist sitting balance, total assist sit<>stand and stedy transfers, unable to safely attempt gait, utilizing TIS wheelchair for pressure relief and trunk control  min assist overall  activity tolerance, pt/family education, bed mobility training, transfer training, L hemibody NMR, positioning   Communication             Safety/Cognition/ Behavioral Observations        further cognitive evaluation recommended   Pain     Right side neck pain and headache   Managed with prns   Assess need for and  effectiveness of prns  Skin     N/a          Discharge Planning:  Plan was to discharge back home. SW imformed that patient daughter unable to provide 24/7 at discharge. Patient recently moved into a home now requiring a ramp and the bathroom is set up small. Patient potentially will transition to SNF. (Resources provided to patient daughter)   Team Discussion: Patient with specific needs and anxiety post large ICH. Urinary retention; overflow and declines intermittent caths. Checking for UTI; MD added meds.  Also note poor awareness of deficits, midline orientation off, poor trunk control and poor attention and verbose. Coughing with thin liquids and pharyngeal retention noted.  Patient on target to meet rehab goals: Currently needs max assist for ADLs, Needs min - mod assist for sit - stand from a raised surface. Needs max assist to sit EOB. Needs TIS wheelchair due to trunk control issues. Goals for discharge set for supervision overall with min assist for transfers.  *See Care Plan and progress notes for long and short-term goals.   Revisions to Treatment Plan:  N/a  Teaching Needs: Safety, medications, secondary risk management, dietary modifications, transfers, toileting, etc  Current Barriers to Discharge: Home enviroment access/layout, Incontinence, and Lack of/limited family support  Possible Resolutions to Barriers: Family education Recommend SNF     Medical Summary Current Status: Severe left neglect, poor awareness of deficits, urinary retention, constipation.  Barriers to Discharge: Medical stability;Neurogenic Bowel & Bladder  Possible Resolutions to [employer]: In-N-Out cath as needed, trial of Flomax, manage constipation.   Continued Need for Acute Rehabilitation Level of Care: The patient requires daily medical management by a physician with specialized training in physical medicine and rehabilitation for the following reasons: Direction of a  multidisciplinary physical rehabilitation program to maximize functional independence : Yes Medical management of patient stability for increased activity during participation in an intensive rehabilitation regime.: Yes Analysis of laboratory values and/or radiology reports with any subsequent need for medication adjustment and/or medical intervention. : Yes   I attest that I was present, lead the team conference, and concur with the assessment and plan of the team.   Chana Bode B 10/03/2021, 3:54 PM

## 2021-10-03 NOTE — Progress Notes (Signed)
Patient ID: Charlotte Hunter, female   DOB: 1955-04-16, 66 y.o.   MRN: [id number]  Team Conference Report to Patient/Family  Team Conference discussion was reviewed with the patient and caregiver, including goals, any changes in plan of care and target discharge date.  Patient and caregiver express understanding and are in agreement.  The patient has a target discharge date of 10/17/21 (SNF pending).  Sw made attempt to see patient and daughter,currently not in patients room. Sw reached out to patients daughter via telephone to provide team conference updates. Daughter aware of the recommendation for SNF due to home accommodations needed and daughter unable to provide 24/7 supervision. Sw informed patient this is not a long term fix and she will need to begin working on home accommodations and arranging 24/7 supervision. This is required because patient will need to have a discharge plan beyond SNF, daughter understood and will begin looking at [facility] facilities. No additional questions or concerns, sw will continue to follow up.   Andria Rhein 10/03/2021, 1:42 PM

## 2021-10-03 NOTE — Progress Notes (Signed)
Pt bladder scanned for at 0545, Pt refused to allow staff to in/out cath. Pt attempted to use the bedpan/urinal x3, but not effective. Pt agreed to in/out cath at 0645. Pt tolerated the procedure well.

## 2021-10-04 MED ORDER — SULFAMETHOXAZOLE-TRIMETHOPRIM 800-160 MG PO TABS
1.0000 | ORAL_TABLET | Freq: Two times a day (BID) | ORAL | Status: DC
  Administered 2021-10-04 – 2021-10-10 (×13): 1 via ORAL
  Filled 2021-10-04 (×13): qty 1

## 2021-10-04 NOTE — Progress Notes (Signed)
Pt bladder scan for 132m has voided in toilet today, along with small BM this morning by disimpaction from this nurse. Pt stated she does want to be I & O cath any longer, and sitting on toilet where she can "push it out more" works best. Pt in bed resting, call bell within reach bed alarm on all needs met.  VDayna Ramus

## 2021-10-04 NOTE — Progress Notes (Signed)
Speech Language Pathology Daily Session Note  Patient Details  Name: Charlotte Hunter MRN: [id number] Date of Birth: 1955-11-09  Today's Date: 10/04/2021 SLP Individual Time: 1048-1130 SLP Individual Time Calculation (min): 42 min  Short Term Goals: Week 1: SLP Short Term Goal 1 (Week 1): Patient will participate in further cognitive-linguistic evaluation SLP Short Term Goal 2 (Week 1): Patient will consume current diet with minimal s/sx of aspiration and sup A for implementation of aspiration precautions SLP Short Term Goal 3 (Week 1): Patient will consume regular texture PO trials and demonstrate effective mastication, oral clearance, and without s/sx of aspiration across 2 sessions prior to diet advancement  Skilled Therapeutic Interventions: Skilled ST treatment focused on swallowing goals. Pt had a visitor at bedside. Pt emotional today and required intermittent verbal redirection to task due to frequency of side conversations with visitor. Spoke with nurse tech who reported pt exhibited no overt s/sx of aspiration with morning meal. Pt required intermittent verbal cues to persist to task which is likely attention related. SLP/NT discussed limiting distractions during meals. Pt continues to report pharyngeal retention with solids. SLP educated on alternating between solids/liquids and implementing effortful swallow to aid clearance. Pt requested to consume cookie brought by visitor. Cookies were perceived as regular texture, however slightly soft/moist and cohesive. Pt consumed with mildly prolonged yet effective mastication, mild L anterior spillage but overall better oral control, trace oral residuals, and no overt s/sx of aspiration. Pt implemented effortful swallow with sup A verbal cues for consistency. No s/s of aspiration observed with single sips of thin liquid by straw. Recommend pt continue dys 3 diet and thin liquids d/t report of pharyngeal retention and likelihood of fatigue. However,  allow pt to consume soft cookies brought by visitor under direct supervision to ensure aspiration precautions are being adhered to and [facility] is elevated in adequate position. SLP educated how to use call bell and bed adjustment control with min A verbal cues. Pt seems to like others do this for her, rather than make attempts herself despite encouragement but also may be limited by visual/physical deficits. Patient was left in bed with alarm activated and immediate needs within reach at end of session. Continue per current plan of care.      Pain Pain Assessment Pain Scale: 0-10 Pain Score: 6  Pain Intervention(s): Medication (See eMAR)  Therapy/Group: Individual Therapy  Keyarra Rendall T Teriana Danker 10/04/2021, 11:01 AM

## 2021-10-04 NOTE — Progress Notes (Signed)
Physical Therapy Session Note  Patient Details  Name: Charlotte Hunter MRN: [id number] Date of Birth: 1956-03-09  Today's Date: 10/04/2021 PT Individual Time: [phone] and 1405-1503 PT Individual Time Calculation (min): 56 min and 58 min  Short Term Goals: Week 1:  PT Short Term Goal 1 (Week 1): Pt will perform supine<>sit with mod assist PT Short Term Goal 2 (Week 1): Pt will perform sit<>stands using LRAD with max assist PT Short Term Goal 3 (Week 1): Pt will perform bed<>chair transfers using LRAD with max assist PT Short Term Goal 4 (Week 1): Pt will participate in gait training of 29ft using +2 assist and lift equipment as necessary to ensure pt safety  Skilled Therapeutic Interventions/Progress Updates:    Session 1: Pt received sitting in TIS w/c with nurse present and pt agreeable to therapy session although reporting fatigue from having just finished with OT session. Pt reports need to use bathroom. Donned tennis shoes total assist for time management.   Sit>stand TIS w/c>stedy with +2 mod assist for lifting, balance, and L hemibody management - continues to requires dependent assist to support L UE and total assist to maintain LLE alignment due to hip weakness causing knee adduction - also continues to have L lateral trunk lean/LOB.  Stedy transfer in/out bathroom to [facility] over toilet.  Standing with mod assist of 1 for balance and L hemibody management while +2 assist provided total assist LB clothing management.   Pt continent of bladder and pt reports feeling stool in her rectum she needs nursing assistance to disimpact - notified nurse for assistance and present during session.   Therapist facilitating safe toileting during nursing care by providing mod assistance for pt to achieve partial squat position in stedy to maintain hips lifted, balance to prevent L lean, and to support L hemibody alignment. Pt requires significantly increased time for toileting and once complete pt  reporting severe fatigue and requesting to return to bed.   Stedy transfer back to bed. Sit>supine with max assist for trunk descent and L hemibody management onto bed. Pt left supine in bed with needs in reach, L UE supported on pillows, bed alarm on, and pt's friend present.     Session 2: Pt received asleep, supine in bed and upon awakening agreeable to therapy session. In supine, donned tennis shoes total assist. Supine>sitting R EOB, HOB slightly elevated and using bedrail, with light max assist for trunk upright and L hemibody management via logroll technique to increase pt independence. Sitting EOB, with pt requiring R UE support on bedrail to maintain trunk control with CGA/close supervision for static sitting balance. Pt reports need to use bathroom.  Sit>stand EOB>stedy with heavy mod assist of 1 for lifting, balance, and L UE management due to flaccidity - continues to have poor L hip alignment/muscle activation with pt tending to have knees adducted together. Stedy transfer in/out bathroom to [facility] over toilet. Pt able to come to standing from stedy seat with light mod assist for balance due to L lean, for lifting, and LUE management. Standing in stedy with light mod assist during dependent assist LB clothing management and peri-care - continent of bladder. Sit>stand from [facility] to stedy with heavy mod/light max assist to rise from this lower surface.    Transported to/from gym in w/c for time management and energy conservation.  Donned L LE ankle DF assist ACE wrap and leg lifter to utilize during gait training.  Sitting in w/c started to don litegait harness and then  came to standing with heavy mod assist of 1 for balance and blocking L knee while +2 assist finished donning the harness.  Gait training in litegait harness ~68ft using partial BWS with +2 assist managing litegait and therapist providing the following manual facilitation and mod/max assist:  - max assist/manual facilitation for  R weight shift onto R LE stance phase to improve L LE swing phase advancement - total assist to advance L LE during swing, pt initiating the movement but slightly less consistently compare to yesterday - total assist to block L knee during stance and facilitation to achieve hip extension - cuing throughout to achieve reciprocal stepping pattern rather than step-to leading with L LE with therapist facilitating L weight shift onto L stance to allow increased L stance time and longer R LE step length  Doffed litegait harness as described above.  Transported back to room and pt requesting to return to bed. R squat/partial stand pivot TIS w/c>EOB with heavy mod/light max assist of 1 for lifting, balance, and trunk control while guarding/blocking L knee to promote WBing for NMR.  Sit>supine with max assist for trunk descent and L hemibody management onto bed. Pt left supine in bed with needs in reach, L hemibody supported on pillows, L LE PRAFO in place, and bed alarm on.     Therapy Documentation Precautions:  Precautions Precautions: Fall, Other (comment) Precaution Comments: L hemiplegia, L inattention, R hip and knee pain Restrictions Weight Bearing Restrictions: No   Pain:  Session 1: Continues to have paraesthesias with burning pain felt in L heel when donning her shoes.  Session 2: Continues to have paraesthesias causing abnormal sensations of pain with certain tactile stimulus or movements - therapist moved extremities slowly and protected them throughout session.     Therapy/Group: Individual Therapy  Ginny Forth , PT, DPT, NCS, CSRS 10/04/2021, 8:00 AM

## 2021-10-04 NOTE — Progress Notes (Signed)
Occupational Therapy Session Note  Patient Details  Name: Charlotte Hunter MRN: [id number] Date of Birth: 1955/06/02  Today's Date: 10/04/2021 OT Individual Time: 8127-5170 OT Individual Time Calculation (min): 59 min    Short Term Goals: Week 1:  OT Short Term Goal 1 (Week 1): Pt will maintain static sitting balance EOB with no more than min A for >5 min. OT Short Term Goal 2 (Week 1): Pt will complete >2 grooming tasks seated at sink with S. OT Short Term Goal 3 (Week 1): Pt will don shirt in supported sitting mod A. OT Short Term Goal 4 (Week 1): Pt will complete toilet transfer and LRAD with max A.  Skilled Therapeutic Interventions/Progress Updates:    Patient received reclined supine.  Patient with self distracting behavior.  Patient assisted to roll toward right side with overt cueing and facilitation to include left upper quadrant.  Very reliant on UE's versus postural muscles for all transitional movements.  Skilled OT intervention to address seated postural control in both supported and unsupported conditions.  Patient falls backward and to left in sitting with no attempts to correct.  Worked on aligning feet onto ground to help establish base of support.   Transferred level surface toward left with max assist.  Patient able to assist with transfer and transitioned to full stand with mod assist - but unable to pivot toward left.  Worked on alignment of hips in wheelchair for best postural control during bathing and dressing task.  Patient facilitated to sit forward in chair with LUE supported on sink.  Patient with near constant dialogue unrelated to task - seemingly unaware of impact.  Patient redirected back to task, and given increased time without cueing to allow her to ID problems and attempt to self correct.   Patient left up in wheelchair - slightly tilted back with leg rests in place - seat alarm in place and engaged and personal items within reach.    Therapy  Documentation Precautions:  Precautions Precautions: Fall, Other (comment) Precaution Comments: L hemiplegia, L inattention, R hip and knee pain Restrictions Weight Bearing Restrictions: No  Pain: Pain Assessment Pain Scale: 0-10 Pain Score: 6  - varying areas - right hip/knee, low back, left shoulder Pain Intervention(s): Medication (See eMAR)    Therapy/Group: Individual Therapy  Collier Salina 10/04/2021, 12:57 PM

## 2021-10-05 MED ORDER — TRAMADOL HCL 50 MG PO TABS: 50.0000 mg | ORAL_TABLET | Freq: Once | ORAL | Status: DC

## 2021-10-05 MED ORDER — SENNOSIDES-DOCUSATE SODIUM 8.6-50 MG PO TABS
2.0000 | ORAL_TABLET | Freq: Two times a day (BID) | ORAL | Status: DC
  Administered 2021-10-05 – 2021-11-06 (×52): 2 via ORAL
  Filled 2021-10-05 (×63): qty 2

## 2021-10-05 MED ORDER — MINERAL OIL PO OIL
TOPICAL_OIL | Freq: Every day | ORAL | Status: DC | PRN
  Administered 2021-10-14 – 2021-10-17 (×2): 30 mL via ORAL
  Filled 2021-10-05 (×3): qty 30

## 2021-10-05 NOTE — Progress Notes (Signed)
Speech Language Pathology Daily Session Note  Patient Details  Name: Charlotte Hunter MRN: [id number] Date of Birth: 1955/10/14  Today's Date: 10/05/2021 SLP Individual Time: 1032-1120 SLP Individual Time Calculation (min): 48 min  Short Term Goals: Week 1: SLP Short Term Goal 1 (Week 1): Patient will participate in further cognitive-linguistic evaluation SLP Short Term Goal 2 (Week 1): Patient will consume current diet with minimal s/sx of aspiration and sup A for implementation of aspiration precautions SLP Short Term Goal 3 (Week 1): Patient will consume regular texture PO trials and demonstrate effective mastication, oral clearance, and without s/sx of aspiration across 2 sessions prior to diet advancement  Skilled Therapeutic Interventions: #1 Skilled ST services focused on cognitive skills. Pt demonstrated ability to direct care mod I, requesting to transfer to bed. PT was transfer to bed via stedy by two NT, pt followed commands with occasional processing delay. Pt expressed fatigue and demonstrate low vocal intensity with 80% intelligibility in a quiet environment. SLP administered formal cognitive assessment Cognistat, pt scored WFL except for the construction task not tested due to a visitor entering. Pt missed 10 minutes of treatment to visit with a visitor upon request on pt's birthday! Pt was left in room with friend, call bell within reach and bed alarm set. SLP recommends to continue skilled services.  SLP was unable to make up time later in the day due to conflicting schedules.    Pain Pain Assessment Pain Score: 0-No pain  Therapy/Group: Individual Therapy  Ramani Riva  [facility] 10/05/2021, 2:43 PM

## 2021-10-06 LAB — URINE CULTURE: Culture: >=100000 — AB

## 2021-10-07 MED ORDER — ORAL CARE MOUTH RINSE
15.0000 mL | OROMUCOSAL | Status: DC
  Administered 2021-10-08 – 2021-11-06 (×74): 15 mL via OROMUCOSAL

## 2021-10-07 MED ORDER — ORAL CARE MOUTH RINSE
15.0000 mL | OROMUCOSAL | Status: DC | PRN
  Administered 2021-10-29: 15 mL via OROMUCOSAL

## 2021-10-07 MED ORDER — GABAPENTIN 100 MG PO CAPS
100.0000 mg | ORAL_CAPSULE | Freq: Three times a day (TID) | ORAL | Status: DC
Start: 2021-10-07 — End: 2021-10-19
  Administered 2021-10-07 – 2021-10-19 (×37): 100 mg via ORAL
  Filled 2021-10-07 (×37): qty 1

## 2021-10-07 MED ORDER — BLACK COHOSH 40 MG PO CAPS: 40.0000 mg | ORAL_CAPSULE | Freq: Every day | ORAL | Status: DC

## 2021-10-08 DIAGNOSIS — K5900 Constipation, unspecified: Secondary | ICD-10-CM

## 2021-10-08 DIAGNOSIS — I619 Nontraumatic intracerebral hemorrhage, unspecified: Secondary | ICD-10-CM

## 2021-10-08 DIAGNOSIS — M792 Neuralgia and neuritis, unspecified: Secondary | ICD-10-CM

## 2021-10-08 DIAGNOSIS — I10 Essential (primary) hypertension: Secondary | ICD-10-CM

## 2021-10-08 DIAGNOSIS — R232 Flushing: Secondary | ICD-10-CM

## 2021-10-08 MED ORDER — BLACK COHOSH 40 MG PO CAPS: 40.0000 mg | ORAL_CAPSULE | Freq: Every day | ORAL | Status: DC

## 2021-10-08 MED ORDER — ESTROGENS CONJUGATED 0.625 MG/GM VA CREA
1.0000 | TOPICAL_CREAM | Freq: Every day | VAGINAL | Status: DC
  Administered 2021-10-09 – 2021-10-30 (×17): 1 via VAGINAL
  Filled 2021-10-08 (×2): qty 30

## 2021-10-08 NOTE — Progress Notes (Signed)
PROGRESS NOTE   Subjective/Complaints:  Pt asked again about her estrogen medication. Discussed this is contraindication.   ROS- neg CP, SOB, N/V/D, no dysuria , fevers, chills  Objective:   No results found. No results for input(s): "WBC", "HGB", "HCT", "PLT" in the last 72 hours.  No results for input(s): "NA", "K", "CL", "CO2", "GLUCOSE", "BUN", "CREATININE", "CALCIUM" in the last 72 hours.   Intake/Output Summary (Last 24 hours) at 10/08/2021 1352 Last data filed at 10/08/2021 0810 Gross per 24 hour  Intake 840 ml  Output --  Net 840 ml         Physical Exam: Vital Signs Blood pressure (!) 108/50, pulse 90, temperature 97.9 F (36.6 C), temperature source Oral, resp. rate 17, height 5' 5.98" (1.676 m), weight 83.7 kg, SpO2 97 %.  General: No acute distress Mood and affect are appropriate, anxious Heart: Regular rate and rhythm no rubs murmurs or extra sounds Lungs: Clear to auscultation, breathing unlabored, no rales or wheezes Abdomen: Positive bowel sounds, soft nontender to palpation, nondistended Extremities: No clubbing, cyanosis, or edema Skin: No evidence of breakdown, no evidence of rash Neurologic: Cranial nerves II through XII intact, motor strength is 5/5 in right and 0/5 left deltoid, bicep, tricep, grip, hip flexor, knee extensors, ankle dorsiflexor and plantar flexor Trace left hip ext Sensory exam normal sensation to light touch and proprioception in right and partial sensation to pinch  left  upper and lower extremities  Musculoskeletal: Full range of motion in all 4 extremities. No joint swelling    Assessment/Plan: 1. Functional deficits which require 3+ hours per day of interdisciplinary therapy in a comprehensive inpatient rehab setting. Physiatrist is providing close team supervision and 24 hour management of active medical problems listed below. Physiatrist and rehab team continue to  assess barriers to discharge/monitor patient progress toward functional and medical goals  Care Tool:  Bathing    Body parts bathed by patient: Chest, Abdomen, Right upper leg, Left upper leg, Left arm, Face   Body parts bathed by helper: Right arm, Front perineal area, Buttocks, Right lower leg, Left lower leg     Bathing assist Assist Level: Moderate Assistance - Patient 50 - 74%     Upper Body Dressing/Undressing Upper body dressing   What is the patient wearing?: Pull over shirt    Upper body assist Assist Level: Maximal Assistance - Patient 25 - 49%    Lower Body Dressing/Undressing Lower body dressing      What is the patient wearing?: Underwear/pull up, Pants     Lower body assist Assist for lower body dressing: Total Assistance - Patient < 25%     Toileting Toileting Toileting Activity did not occur Press photographer and hygiene only): N/A (no void or bm)  Toileting assist Assist for toileting: 2 Helpers     Transfers Chair/bed transfer  Transfers assist  Chair/bed transfer activity did not occur: Safety/medical concerns  Chair/bed transfer assist level: Maximal Assistance - Patient 25 - 49% (squat pivot)     Locomotion Ambulation   Ambulation assist   Ambulation activity did not occur: Safety/medical concerns  Assist level: 2 helpers Assistive device: Lite Gait Max distance: 11ft  Walk 10 feet activity   Assist  Walk 10 feet activity did not occur: Safety/medical concerns        Walk 50 feet activity   Assist Walk 50 feet with 2 turns activity did not occur: Safety/medical concerns         Walk 150 feet activity   Assist Walk 150 feet activity did not occur: Safety/medical concerns         Walk 10 feet on uneven surface  activity   Assist Walk 10 feet on uneven surfaces activity did not occur: Safety/medical concerns         Wheelchair     Assist Is the patient using a wheelchair?: Yes Type of Wheelchair:  Manual (TIS) Wheelchair activity did not occur: Safety/medical concerns         Wheelchair 50 feet with 2 turns activity    Assist    Wheelchair 50 feet with 2 turns activity did not occur: Safety/medical concerns       Wheelchair 150 feet activity     Assist  Wheelchair 150 feet activity did not occur: Safety/medical concerns       Blood pressure (!) 108/50, pulse 90, temperature 97.9 F (36.6 C), temperature source Oral, resp. rate 17, height 5' 5.98" (1.676 m), weight 83.7 kg, SpO2 97 %.  Medical Problem List and Plan: 1. 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  Have neuro review to see if Xarelto can be started              -patient may  shower             -ELOS/Goals: 16-18d Sup/minA,   -Cont PT,OT,SLP  -Repeat CT around 10/12/2021  2.  Antithrombotics: -DVT/anticoagulation:  Mechanical: Antiembolism stockings, thigh (TED hose) Bilateral lower extremities             -antiplatelet therapy: N/A 3. Pain Management: Tylenol as needed, has burning discomfort on Left side  Increase Gabapentin for neurogenic pain  6/26 gabapentin increased yesterday, monitor response 4. Mood/Sleep: Provide emotional support, pt frustrated with dependant role will stop buspirone and monitor              -antipsychotic agents: N/A 5. Neuropsych/cognition: This patient is capable of making decisions on her own behalf. 6. Skin/Wound Care: Routine skin checks 7. Fluids/Electrolytes/Nutrition: Routine in and outs with follow-up chemistries 8.  Dysphagia.  Dysphagia #3 thin liquids 9.  Hypertension.  Cozaar 50 mg daily, Lopressor 25 mg twice daily.  Monitor with increased mobility Vitals:   10/08/21 0246 10/08/21 1311  BP: 105/61 (!) 108/50  Pulse: 69 90  Resp: 18 17  Temp: 97.6 F (36.4 C) 97.9 F (36.6 C)  SpO2: 92% 97%   Well controlled overall, continue to  follow  There were no vitals filed for this visit.  10.  PAF.  Eliquis on hold due to ICH.  Continue Lopressor.  Cardiac rate controlled. 11.  Obesity.  BMI 29.21.  Dietary follow-up 12.  History of recurrent UTI.  Prophylactic Macrodantin 50 mg daily.resume once pt done with Bactrim DS will do 7d tx >100K Kleb P  13.  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  Lethargic with poor sitting balance yesterday , repeat CT head unchanged  15.  Constipation , problem at times  at home, drank mineral oil and coffee to help, type 5 BM on 6/24 -6/26 last documented 6/24, on senokot and miralax BID, consider additional med if no BM today 16.  Hot flashes , many years post menopausal , avoid estrogens, gabapentin may help but could require a higher dose   -6/26 discussed avoiding estrogens at this time LOS: 7 days A FACE TO FACE EVALUATION WAS PERFORMED  Fanny Dance 10/08/2021, 1:52 PM

## 2021-10-08 NOTE — Progress Notes (Addendum)
Occupational Therapy Session Note  Patient Details  Name: Charlotte Hunter MRN: [id number] Date of Birth: [birth year]  Today's Date: 10/08/2021 OT Individual Time: 7829-5621 OT Individual Time Calculation (min): 45 min    Short Term Goals: Week 1:  OT Short Term Goal 1 (Week 1): Pt will maintain static sitting balance EOB with no more than min A for >5 min. OT Short Term Goal 2 (Week 1): Pt will complete >2 grooming tasks seated at sink with S. OT Short Term Goal 3 (Week 1): Pt will don shirt in supported sitting mod A. OT Short Term Goal 4 (Week 1): Pt will complete toilet transfer and LRAD with max A.  Skilled Therapeutic Interventions/Progress Updates:     Upon OT arrival, pt semi recumbent in bed. Pt reports 7/10 pain in R hip and knee. RN notified. Pt agreeable to OT treatment session and requests to use the bathroom. Pt completes supine to sit transfer with Mod A, uses sara stedy to transfer sit to stand with Mod A, completes pants management with Mod A and transfers onto [employer] with Mod A. Pt able to void and requires Total A for pericare and Max A for pants management. Pt was fatigued by end of toileting and was returned to bed using stedy with Mod A. Pt requires Mod A for sit to supine and Max A x 2 for scooting up to [facility]. Pt requires increased time to get repositioned comfortably, including rolling to B sides with Max A, pillows propped to all extremities, and towel roll to L UE for skin integrity. Pt appreciative at end of session. Pt left in bed with all safety measures in place.   Therapy Documentation Precautions:  Precautions Precautions: Fall, Other (comment) Precaution Comments: L hemiplegia, L inattention, R hip and knee pain Restrictions Weight Bearing Restrictions: No   Therapy/Group: Individual Therapy  Carolin Sicks 10/08/2021, 5:05 PM

## 2021-10-09 LAB — TROPONIN I (HIGH SENSITIVITY)
Troponin I (High Sensitivity): 5 ng/L (ref <18)
Troponin I (High Sensitivity): 5 ng/L (ref <18)

## 2021-10-09 MED ORDER — DICLOFENAC SODIUM 1 % EX GEL
2.0000 g | CUTANEOUS | Status: DC | PRN
  Administered 2021-10-12 – 2021-11-05 (×37): 2 g via TOPICAL
  Filled 2021-10-09 (×7): qty 100

## 2021-10-09 NOTE — Progress Notes (Signed)
Occupational Therapy Weekly Progress Note  Patient Details  Name: Charlotte Hunter MRN: [id number] Date of Birth: July 29, 1955  Beginning of progress report period: October 02, 2021 End of progress report period: October 09, 2021  Today's Date: 10/09/2021 Patient has met 2 of 4 short term goals. Pt currently requires MOD A for bathing, MAX A for UB and LB dressing, MAX A for squat pivot transfers, and MAX A for 3/3 toilteing tasks. Pt continues to present with increased pain, impaired balance, LUE hemiparesis, decreased activity tolerance and generalized deconditioning. Plan to DC to SNF when bed available.   Patient continues to demonstrate the following deficits: muscle weakness, decreased cardiorespiratoy endurance, impaired timing and sequencing, unbalanced muscle activation, decreased coordination, decreased motor planning, and acute pain, decreased initiation, decreased attention, decreased awareness, decreased problem solving, and decreased safety awareness, and decreased sitting balance, decreased standing balance, hemiplegia, and decreased balance strategies and therefore will continue to benefit from skilled OT intervention to enhance overall performance with BADL and Reduce care partner burden.  Patient progressing toward long term goals..  Plan of care revisions: Did downgrade some goals to min A .  OT Short Term Goals Week 1:  OT Short Term Goal 1 (Week 1): Pt will maintain static sitting balance EOB with no more than min A for >5 min. OT Short Term Goal 1 - Progress (Week 1): Met OT Short Term Goal 2 (Week 1): Pt will complete >2 grooming tasks seated at sink with S. OT Short Term Goal 2 - Progress (Week 1): Progressing toward goal OT Short Term Goal 3 (Week 1): Pt will don shirt in supported sitting mod A. OT Short Term Goal 3 - Progress (Week 1): Progressing toward goal OT Short Term Goal 4 (Week 1): Pt will complete toilet transfer and LRAD with max A. OT Short Term Goal 4 - Progress  (Week 1): Met Week 2:  OT Short Term Goal 1 (Week 2): pt will engage in one grooming tasks from w/c at sink with MOD A OT Short Term Goal 2 (Week 2): pt will completed 1/3 toileting tasks with MAXA OT Short Term Goal 3 (Week 2): pt will complete sit>stand with MOD A   Therapy Documentation Precautions:  Precautions Precautions: Fall, Other (comment) Precaution Comments: L hemiplegia, L inattention, R hip and knee pain Restrictions Weight Bearing Restrictions: No    Therapy/Group: Individual Therapy  Corinne Ports [employer] 10/09/2021, 1:03 PM

## 2021-10-09 NOTE — Progress Notes (Signed)
Speech Language Pathology Weekly Progress and Session Note  Patient Details  Name: Charlotte Hunter MRN: [id number] Date of Birth: 20-May-1955  Beginning of progress report period: October 02, 2021 End of progress report period: October 09, 2021  Today's Date: 10/09/2021 SLP Individual Time: 4098-1191 SLP Individual Time Calculation (min): 26 min  Short Term Goals: Week 1: SLP Short Term Goal 1 (Week 1): Patient will participate in further cognitive-linguistic evaluation SLP Short Term Goal 1 - Progress (Week 1): Met SLP Short Term Goal 2 (Week 1): Patient will consume current diet with minimal s/sx of aspiration and sup A for implementation of aspiration precautions SLP Short Term Goal 2 - Progress (Week 1): Met SLP Short Term Goal 3 (Week 1): Patient will consume regular texture PO trials and demonstrate effective mastication, oral clearance, and without s/sx of aspiration across 2 sessions prior to diet advancement SLP Short Term Goal 3 - Progress (Week 1): Met  New Short Term Goals: Week 2: SLP Short Term Goal 1 (Week 2): STG=LTG due to ELOS  Weekly Progress Updates: Patient has made functional gains and has met 3 of 3 STGs this reporting period. Patient is currently completing cognitive tasks with overall mod I-to-sup A level. Pt requires frequent verbal redirection for attention to task and at times can be perseverative which can impact functional participation. This is felt to be behavioral vs. impairment based from CVA, as pt continuously scores WFL on cognitive assessments and daughter supports pt is at baseline function without any observed cognitive-linguistic changes s/p CVA. Pt's speech is perceived as >90% intelligible at the conversation with occasional verbal cues to increase vocal intensity. Daughter also feels pt is at baseline from a speech/voice perspective. Stated she is just more "reserved" and less talkative than usual. Pt is consuming a regular texture diet and thin liquids  with minimal s/sx of aspiration and mod I for implementation of safe swallowing precautions and strategies. Pt requires set-up A for meals and benefits from intermittent supervision from a self feeding standpoint and to ensure swallow safety. Patient and family education is ongoing. Anticipate discharge from SLP services during following session after further education on speech/swallow function.   Intensity: Minumum of 1-2 x/day, 30 to 90 minutes Frequency: 1 to 3 out of 7 days Duration/Length of Stay: 7/5 SNF pending Treatment/Interventions: Cognitive remediation/compensation;Dysphagia/aspiration precaution training;Speech/Language facilitation;Functional tasks;Patient/family education   Daily Session Skilled Therapeutic Interventions: Skilled ST treatment focused on further cognitive assessment. Pt was sleeping in bed on arrival and roused to moderate verbal/tactile stimulation. Pt reported feeling drowsy from being given pain medication earlier. Denied pain/discomfort. Alertness improved as HOB was elevated and lights turned on. Per behavioral plan in place, SLP communicated goals for today's session with minimal redirection needed. SLP administered visual constructional task via the Cognistat. Pt scored WFL and completed task in a timely manner with self correction resulting in no mistakes. Overall, pt scoring WFL on cognitive-linguistic assessments including SLUMS and Cognistat. Pt continues to support being near/at baseline function and denies any cognitive-linguistic changes s/p CVA. SLP will likely plan to discharge from SLP services at following session after additional education. Patient was left in bed with alarm activated and immediate needs within reach at end of session. Continue per current plan of care.       General    Pain Pain Assessment Pain Scale: 0-10 Pain Score: 0-No pain  Therapy/Group: Individual Therapy  Tamala Ser 10/09/2021, 4:05 PM

## 2021-10-10 MED ORDER — TIZANIDINE HCL 4 MG PO TABS
2.0000 mg | ORAL_TABLET | Freq: Every day | ORAL | Status: DC
Start: 2021-10-10 — End: 2021-11-07
  Administered 2021-10-10 – 2021-11-06 (×28): 2 mg via ORAL
  Filled 2021-10-10 (×28): qty 1

## 2021-10-10 MED ORDER — SULFAMETHOXAZOLE-TRIMETHOPRIM 800-160 MG PO TABS
1.0000 | ORAL_TABLET | Freq: Two times a day (BID) | ORAL | Status: AC
  Administered 2021-10-10 – 2021-10-11 (×2): 1 via ORAL
  Filled 2021-10-10 (×2): qty 1

## 2021-10-10 NOTE — Progress Notes (Signed)
Occupational Therapy Session Note  Patient Details  Name: Charlotte Hunter MRN: [id number] Date of Birth: 18-Oct-1955  Today's Date: 10/10/2021 OT Individual Time: 1105-1200 OT Individual Time Calculation (min): 55 min    Short Term Goals: Week 2:  OT Short Term Goal 1 (Week 2): pt will engage in one grooming tasks from w/c at sink with MOD A OT Short Term Goal 2 (Week 2): pt will completed 1/3 toileting tasks with MAXA OT Short Term Goal 3 (Week 2): pt will complete sit>stand with MOD A  Skilled Therapeutic Interventions/Progress Updates:  Pt greeted seated in w/c reporting fatigue but agreeable to OT intervention. Total A transport to gym where pt completed squat pivot to EOM with MOD A+2 d/t fatigue. Pt requested to work on from supine. MAX A for sit>supine. Pt completed below NMR tasks to facilitate improved LUE AROM - secured pt LUE to unweighted dowel rod to work on [employer] pt completed below therex: X10 chest presses X10 shoulder flexion to 90* X10 circumduction forwards and backwards  Supine>sit with MODA +2. Pt able to completed x5 sit>stands using back of stedy bar to pull up with overall MODA with LLE supported. Pt able to stand for ~ 30 secs each time and even able to work on lateral weight shifting to retrieve clothespins with mODA.  Pt completed 2 more squat pivots during session progressing to heavy MIN A to R side. Pt left supine in bed with bed alarm activated and all needs within reach.   Therapy Documentation Precautions:  Precautions Precautions: Fall, Other (comment) Precaution Comments: L hemiplegia, L inattention, R hip and knee pain Restrictions Weight Bearing Restrictions: No  Pain: Unrated pain reported in back, R knee, and R hip. Rest breaks and repositioning provided as needed.    Therapy/Group: Individual Therapy  Barron Schmid 10/10/2021, 12:12 PM

## 2021-10-10 NOTE — Plan of Care (Signed)
  Problem: Sit to Stand Goal: LTG:  Patient will perform sit to stand in prep for activites of daily living with assistance level (OT) Description: LTG:  Patient will perform sit to stand in prep for activites of daily living with assistance level (OT) Flowsheets (Taken 10/10/2021 1058) LTG: PT will perform sit to stand in prep for activites of daily living with assistance level: (downgraded JLS) Minimal Assistance - Patient > 75% Note: downgraded JLS   Problem: RH Bathing Goal: LTG Patient will bathe all body parts with assist levels (OT) Description: LTG: Patient will bathe all body parts with assist levels (OT) Flowsheets (Taken 10/10/2021 1058) LTG: Pt will perform bathing with assistance level/cueing: (downgraded JLS) Moderate Assistance - Patient 50 - 74% Note: downgraded JLS   Problem: RH Tub/Shower Transfers Goal: LTG Patient will perform tub/shower transfers w/assist (OT) Description: LTG: Patient will perform tub/shower transfers with assist, with/without cues using equipment (OT) Flowsheets (Taken 10/10/2021 1058) LTG: Pt will perform tub/shower stall transfers with assistance level of: (downgraded JLS) Moderate Assistance - Patient 50 - 74% Note: downgraded JLS

## 2021-10-10 NOTE — Patient Care Conference (Signed)
Inpatient RehabilitationTeam Conference and Plan of Care Update Date: 10/10/2021   Time: 10:57 AM   Patient Name: Charlotte Hunter      Medical Record Number: [id number]  Date of Birth: [birth year] Sex: Female         Room/Bed: 4W20C/4W20C-01 Payor Info: Payor: MEDICARE / Plan: MEDICARE PART A AND B / Product Type: *No Product type* /    Admit Date/Time:  10/01/2021  3:35 PM  Primary Diagnosis:  ICH (intracerebral hemorrhage) [facility])  Hospital Problems: Principal Problem:   ICH (intracerebral hemorrhage) [facility])    Expected Discharge Date: Expected Discharge Date: 10/17/21 (SNF pending)  Team Members Present: Physician leading conference: Dr. Alysia Penna Social Worker Present: Erlene Quan, [place] Nurse Present: Dorien Chihuahua, RN PT Present: Page Spiro, PT OT Present: Willeen Cass, Ludwig Lean, COTA SLP Present: Sherren Kerns, SLP PPS Coordinator present : Ileana Ladd, PT     Current Status/Progress Goal Weekly Team Focus  Bowel/Bladder   Continent of bladder/bowel LBM 10/06/21, refusing Myalax at times,  Maintain continence, toileting Q2 hrs and prn  Encourage po intake   Swallow/Nutrition/ Hydration   mod I  mod I  reg diet/thin liquid tolerance   ADL's   MAX A for ADLs, MOD A for squat pivot to R side, MAX A for 3/3 toileting tasks, trace movement in L shoulder with AAROM, CGA for sitting balance, limited by pain at times, continues to be particular about needs.  downgraded to mostly MIN A  BADL reeducation, transfer training, balance training, functional mobility, LUE NMR   Mobility   mod/max assist supine<>sit, CGA to min/mod assist dynamic sitting balance, mod assist sit<>stand, mod assist squat pivot transfers, +2 gait up to ~12f in litegait harness vs 357fwith +2 mod/max assist 3 [place], progressed to [place]elchair  min assist overall  activity tolerance, pt/family education, bed mobility training, transfer training, L hemibody NMR, dynamic  sitting balance, standing balance, positioning, gait training   Communication             Safety/Cognition/ Behavioral Observations  mod I-to-sup A; can be limited by attention and perseveration however this is felt to be behavioral/personality vs. impairment based. Daughter supports pt is at baseline function  No LTG's set. Pt met STGs for further cognitive-linguistic evaluation  Complete further cognitive-linguistic evaluation   Pain             Skin   Skin intact  Maintain skin intergity,  Assess QS/PRN, assist and encourage with repositioning     Discharge Planning:  Patient to discharge to SNF   Team Discussion: Patient with left hemi-sensory deficits, poor awareness and attention deficits post ICH. Constipation and UTI  addressed. Episode of chest pain 10/09/21; deemed muscular in nature. Needs follow up CT for medication clearance before discharge .  Patient on target to meet rehab goals: Currently needs max assist for ADLs and mod assist for squat pivot to the right, and toileting. Needs total assist to stand. Requires mod assist for bed mobility and supine to sit. Goals for discharge set for min assist overall.   *See Care Plan and progress notes for long and short-term goals.   Revisions to Treatment Plan:  Downgraded OT goals Three muskateer style ambulation trRunner, broadcasting/film/video/c trial   Teaching Needs: Safety, medications, transfers, toileting, etc  Current Barriers to Discharge: Home enviroment access/layout and Lack of/limited family support  Possible Resolutions to Barriers: SNF recommended     Medical Summary  I attest that I was present, lead the team conference, and concur with the assessment and plan of the team.   Margarito Liner 10/10/2021, 5:23 PM

## 2021-10-10 NOTE — Progress Notes (Signed)
Patient ID: Charlotte Hunter, female   DOB: [birth year], 66 y.o.   MRN: [id number]  Patient SNF referral shared on the HUB. Sw will wait for acceptances and update patient daughter.

## 2021-10-10 NOTE — Progress Notes (Signed)
Physical Therapy Session Note  Patient Details  Name: Charlotte Hunter MRN: [id number] Date of Birth: [birth year]  Today's Date: 10/10/2021 PT Individual Time: 1405-1430 PT Individual Time Calculation (min): 25 min   Short Term Goals: Week 1:  PT Short Term Goal 1 (Week 1): Pt will perform supine<>sit with mod assist PT Short Term Goal 1 - Progress (Week 1): Progressing toward goal PT Short Term Goal 2 (Week 1): Pt will perform sit<>stands using LRAD with max assist PT Short Term Goal 2 - Progress (Week 1): Met PT Short Term Goal 3 (Week 1): Pt will perform bed<>chair transfers using LRAD with max assist PT Short Term Goal 3 - Progress (Week 1): Met PT Short Term Goal 4 (Week 1): Pt will participate in gait training of 79ft using +2 assist and lift equipment as necessary to ensure pt safety PT Short Term Goal 4 - Progress (Week 1): Met Week 2:  PT Short Term Goal 1 (Week 2): Pt will perform supine<>sit with mod assist of 1 consistently PT Short Term Goal 2 (Week 2): Pt will perform sit<>stands using LRAD with mod assist of 1 PT Short Term Goal 3 (Week 2): Pt will perform bed<>chair transfers  using LRAD with mod assist of 1 PT Short Term Goal 4 (Week 2): Pt will ambulate at least 60ft using LRAD with +2 mod assist  Skilled Therapeutic Interventions/Progress Updates:   Pt received supine in bed asleep. Aroused with effort from PT, but and agreeable to PT at bed level. Pt reports having not slept well overnight, resulting in lethargy today.   PT performed LLE stretching for tone management. Heel cord stretch with knee in extension 2 x 1.5 min heel cord stretch with knee flexed 2 x 1 min. Hip abduction stretch 3 x 1 min, HS stretch with knee flexed 3 x 1 min and knee held into extension 2 x 1 min . Hip ROM into full flexion with knee bent 2 x 1 min. Figure 4 stretch 2 x 1.5 min . Pirrformis stretch 2 x 45 sec. Pt reports need to urinate while PT performing piriformis strech, but urgency  resolved when LLE relaxed into neutral position. Pt left supine in bed with call bell in reach with all needs met.        Therapy Documentation Precautions:  Precautions Precautions: Fall, Other (comment) Precaution Comments: L hemiplegia, L inattention, R hip and knee pain Restrictions Weight Bearing Restrictions: No    Pain: Pain Assessment Pain Scale: 0-10 Pain Score: 8  Pain Location: Foot Pain Orientation: Left Pain Intervention(s): Medication (See eMAR);Repositioned    Therapy/Group: Individual Therapy  Lorie Phenix 10/10/2021, 3:08 PM

## 2021-10-10 NOTE — Progress Notes (Signed)
PROGRESS NOTE   Subjective/Complaints: No further CP  Troponin 5, EKG NSR   ROS- neg CP, SOB, N/V/D, no dysuria , fevers, chills  Objective:   No results found. No results for input(s): "WBC", "HGB", "HCT", "PLT" in the last 72 hours.  No results for input(s): "NA", "K", "CL", "CO2", "GLUCOSE", "BUN", "CREATININE", "CALCIUM" in the last 72 hours.   Intake/Output Summary (Last 24 hours) at 10/10/2021 0848 Last data filed at 10/10/2021 0521 Gross per 24 hour  Intake 457 ml  Output 350 ml  Net 107 ml         Physical Exam: Vital Signs Blood pressure 109/61, pulse 71, temperature 98.1 F (36.7 C), resp. rate 18, height 5' 5.98" (1.676 m), weight 83.7 kg, SpO2 93 %.  General: No acute distress Mood and affect are appropriate, anxious Heart: Regular rate and rhythm no rubs murmurs or extra sounds Lungs: Clear to auscultation, breathing unlabored, no rales or wheezes Abdomen: Positive bowel sounds, soft nontender to palpation, nondistended Extremities: No clubbing, cyanosis, or edema Skin: No evidence of breakdown, no evidence of rash Neurologic: Cranial nerves II through XII intact, motor strength is 5/5 in right and 0/5 left deltoid, bicep, tricep, grip, hip flexor, knee extensors, ankle dorsiflexor and plantar flexor Trace left hip ext Sensory exam normal sensation to light touch and proprioception in right and partial sensation to pinch  left  upper and lower extremities  Musculoskeletal: Full range of motion in all 4 extremities. No joint swelling , tender right parasternal    Assessment/Plan: 1. Functional deficits which require 3+ hours per day of interdisciplinary therapy in a comprehensive inpatient rehab setting. Physiatrist is providing close team supervision and 24 hour management of active medical problems listed below. Physiatrist and rehab team continue to assess barriers to discharge/monitor patient  progress toward functional and medical goals  Care Tool:  Bathing    Body parts bathed by patient: Chest, Abdomen, Right upper leg, Left upper leg, Left arm, Face   Body parts bathed by helper: Right arm, Front perineal area, Buttocks, Right lower leg, Left lower leg     Bathing assist Assist Level: Moderate Assistance - Patient 50 - 74%     Upper Body Dressing/Undressing Upper body dressing   What is the patient wearing?: Pull over shirt    Upper body assist Assist Level: Maximal Assistance - Patient 25 - 49%    Lower Body Dressing/Undressing Lower body dressing      What is the patient wearing?: Underwear/pull up, Pants     Lower body assist Assist for lower body dressing: Maximal Assistance - Patient 25 - 49%     Toileting Toileting Toileting Activity did not occur (Clothing management and hygiene only): N/A (no void or bm)  Toileting assist Assist for toileting: Maximal Assistance - Patient 25 - 49%     Transfers Chair/bed transfer  Transfers assist  Chair/bed transfer activity did not occur: Safety/medical concerns  Chair/bed transfer assist level: Moderate Assistance - Patient 50 - 74%     Locomotion Ambulation   Ambulation assist   Ambulation activity did not occur: Safety/medical concerns  Assist level: 2 helpers Assistive device: Lite Gait Max distance:  18ft   Walk 10 feet activity   Assist  Walk 10 feet activity did not occur: Safety/medical concerns        Walk 50 feet activity   Assist Walk 50 feet with 2 turns activity did not occur: Safety/medical concerns         Walk 150 feet activity   Assist Walk 150 feet activity did not occur: Safety/medical concerns         Walk 10 feet on uneven surface  activity   Assist Walk 10 feet on uneven surfaces activity did not occur: Safety/medical concerns         Wheelchair     Assist Is the patient using a wheelchair?: Yes Type of Wheelchair: Manual (TIS) Wheelchair  activity did not occur: Safety/medical concerns         Wheelchair 50 feet with 2 turns activity    Assist    Wheelchair 50 feet with 2 turns activity did not occur: Safety/medical concerns       Wheelchair 150 feet activity     Assist  Wheelchair 150 feet activity did not occur: Safety/medical concerns       Blood pressure 109/61, pulse 71, temperature 98.1 F (36.7 C), resp. rate 18, height 5' 5.98" (1.676 m), weight 83.7 kg, SpO2 93 %.  Medical Problem List and Plan: 1. 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  Have neuro review to see if Xarelto can be started              -patient may  shower             -ELOS/Goals: 16-18d Sup/minA,   -Cont PT,OT,SLP  -Repeat CT around 10/12/2021, may need to do tomorrow if going to SNF   2.  Antithrombotics: -DVT/anticoagulation:  Mechanical: Antiembolism stockings, thigh (TED hose) Bilateral lower extremities             -antiplatelet therapy: N/A 3. Pain Management: Tylenol as needed, has burning discomfort on Left side  Increase Gabapentin for neurogenic pain 300mg  TID   4. Mood/Sleep: Provide emotional support, pt frustrated with dependant role will stop buspirone and monitor              -antipsychotic agents: N/A 5. Neuropsych/cognition: This patient is capable of making decisions on her own behalf. 6. Skin/Wound Care: Routine skin checks 7. Fluids/Electrolytes/Nutrition: Routine in and outs with follow-up chemistries 8.  Dysphagia.  Dysphagia #3 thin liquids 9.  Hypertension.  Cozaar 50 mg daily, Lopressor 25 mg twice daily.  Monitor with increased mobility Vitals:   10/09/21 1930 10/10/21 0512  BP: (!) 110/56 109/61  Pulse: 83 71  Resp: 18 18  Temp: 98.2 F (36.8 C) 98.1 F (36.7 C)  SpO2: 95% 93%   Well controlled overall, continue to follow  There were no vitals filed for this visit.   10.  PAF.  Eliquis on hold due to [place].  Continue Lopressor.  Cardiac rate controlled.last 2 EKG were normal NSR, pt would like her cardiologist Dr Chryl Heck at [facility] to be notified of admit  11.  Obesity.  BMI 29.21.  Dietary follow-up 12.  History of recurrent UTI.  Prophylactic Macrodantin 50 mg daily.resume once pt done with Bactrim DS will do 7d tx >100K Kleb P  13.  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  Lethargic with poor sitting balance yesterday , repeat CT head unchanged  15.  Constipation , problem at times at home, drank mineral oil and coffee to help, type 5 BM on 6/24 -6/26 last documented 6/24, on senokot and miralax BID, consider additional med if no BM today 16.  Hot flashes , many years post menopausal , avoid estrogens, gabapentin may help but could require a higher dose   Neuro and Pharmacy discussed use of estrogen for hot flashes some benfit from premarin vaginal cream, discussed potential benefit of gabapentin but would need higher dose , increase to 300mg  TID, if helpful may d/c premarin  17.  Episode of CP, exam has tenderness RIght parasternal , EKG and troponin negative, likely MSK  LOS: 9 days A FACE TO FACE EVALUATION WAS PERFORMED  10/10/2021, 8:48 AM

## 2021-10-10 NOTE — Progress Notes (Signed)
Physical Therapy Session Note  Patient Details  Name: Charlotte Hunter MRN: [id number] Date of Birth: October 03, 1955  Today's Date: 10/10/2021 PT Individual Time: 4403-4742 and 1610-1703 PT Individual Time Calculation (min): 43 min and 53 min   Short Term Goals: Week 2:  PT Short Term Goal 1 (Week 2): Pt will perform supine<>sit with mod assist of 1 consistently PT Short Term Goal 2 (Week 2): Pt will perform sit<>stands using LRAD with mod assist of 1 PT Short Term Goal 3 (Week 2): Pt will perform bed<>chair transfers  using LRAD with mod assist of 1 PT Short Term Goal 4 (Week 2): Pt will ambulate at least 83ft using LRAD with +2 mod assist  Skilled Therapeutic Interventions/Progress Updates:    Session 1: Pt received supported upright in bed finishing eating breakfast and agreeable to therapy session. Pt inquiring about her ability to use rollator at this time demonstrating impaired awareness - therapist utilized questioning to help pt come to the conclusion that a rollator is not appropriate nor safe at this time. Discussed with pt that she will likely D/C to next level of care at a wheelchair level. Supine>sitting R EOB, HOB partially elevated and relying on bedrail, via logroll technique with mod assist primarily for L hemibody management - step-by-step cuing for sequencing to increase pt independence. Sitting EOB with CGA for trunk control while threading pants on LEs with min assist. Sitting EOB with mod assist for trunk control donned R sock via figure-4 sitting, but pt unable to tolerate that amount of external rotation on her L hip therefore requiring total assist to don that sock - shoes and L LE [place] Walk-on Reaction GRAFO donned total assist. Sit>stand elevated EOB>R UE support on bedrail with mod assist of 1 for lifting and balance while blocking L knee to promote WBing - pulled pants up over hips with mod assist - continues to have R weight shift bias, limiting L LE WBing in standing. L  squat pivot transfer EOB>w/c with mod assist of 1 for lifting/pivoting hips with cuing for head/hips relationship and achieving sufficient anterior trunk lean/weight shift to clear hips. Pt left seated in w/c with needs in reach, seat belt alarm on, and L UE supported on pillow.    Session 2: Pt received supine in bed and agreeable to therapy session. Supine>sitting R EOB, HOB slightly elevated and relying on bedrail, via logroll technique with mod assist primarily for L hemibody management - step-by-step cuing for sequencing to increase pt independence. Sitting EOB using R UE support on bedrail as needed for balance while therapist donned shoes and L LE [place] Walk-on Reaction GRAFO total assist. Nurse present for medication administration. L squat pivot transfer EOB>w/c with mod assist for lifting/pivoting hips and blocking L knee to promote WBing - cuing for head/hips relationship and increased anterior trunk lean/weight shift to improve hip clearance.  Transported to/from gym in w/c for time management and energy conservation.  Sit>stand from lower wheelchair to R UE support on litegait with pt now requiring +2 mod assist for lifting into standing from this lower seat height. Standing using R UE support on litegait with mod assist of 1 for balance while blocking L knee (pt continues to have R weight shift bias, limiting L LE WBing) while +2 assist donned harness.  Donned ACE wrap to maintain L hand grasp on litegait handle.  Gait training 62ft + 71ft in litegait harness providing safety but no true BWS at this time with +2 assist managing litegait  and therapist providing the below manual facilitation/assist while focusing on slowing down gait training and allowing pt more time to process and motor plan for increased muscle activation: - requires total assist to advance L LE during swing although pt is able to initiate this movement, continuing to use some compensatory movement at pelvis in addition  to some hip flexor activation possible - requires total assist to block L knee and facilitate hip extension during stance with pt demonstrating likely slight muscle activation in her glutes and quads today -- initially she is strongly veering to L side of the litegait space requiring cuing to step R LE forward and laterally, improves  -- continues to require cuing and facilitation for weight shifting onto stance limb  Doffed litegait harness.   Throughout session, pt requires cuing to redirect and sustain attention to task at hand as she is hyper-verbal and tangential.  Transported back to room and pt agreeable to remain sitting up in w/c - left with needs in reach, seat belt alarm on, L UE supported on pillow.  Therapy Documentation Precautions:  Precautions Precautions: Fall, Other (comment) Precaution Comments: L hemiplegia, L inattention, R hip and knee pain Restrictions Weight Bearing Restrictions: No   Pain: Session 1: Continues to report paresthesias with pain in L hemibody - nurse present during session for medication administration.   Session 2: Continues to report paresthesias with pain in L hemibody of a "burning" sensation - provided repositioning, distraction, and emotional support for pain management - pt premedicated and nurse present during session for further medication administration.   Therapy/Group: Individual Therapy  Ginny Forth , PT, DPT, NCS, CSRS 10/10/2021, 7:57 AM

## 2021-10-10 NOTE — Progress Notes (Signed)
Patient ID: Charlotte Hunter, female   DOB: 07-02-1955, 66 y.o.   MRN: [id number]  Team Conference Report to Patient/Family  Team Conference discussion was reviewed with the patient and caregiver, including goals, any changes in plan of care and target discharge date.  Patient and caregiver express understanding and are in agreement.  The patient has a target discharge date of 10/17/21 (SNF pending).  Sw spoke with patient daughter to inform her to expect a call from the physician per request to further discuss patient prognosis. SW received patient daughter SNF preferences. Sw informed patient daughter that her preferences were primarily retirement communities vs. SNF. SW informed patient daughter to review the list provided. Sw will send on patient referral on the hub, daughter informed. No additional questions or concerns.   Andria Rhein 10/10/2021, 11:26 AM

## 2021-10-11 ENCOUNTER — Inpatient Hospital Stay (HOSPITAL_COMMUNITY): Payer: Medicare Other

## 2021-10-11 NOTE — Progress Notes (Signed)
Patient anxious and demanding throughout the shift. Timer put on however patient not understanding that staff can not stay in her room for 45 minutes at a time and then gets mad when you leave. Daughter at bed side, states she is the patients POA and wants copies of her care/notes/labs. Told patients daughter to bring in the copy of POA and we will put copies in her chart so she can request patients file.

## 2021-10-11 NOTE — Progress Notes (Signed)
Request PRAFO boot be removed because it is causing discomfort to left leg "tingling and spasms ",explain purpose of boot and states she still want it removed, and request done.

## 2021-10-11 NOTE — Progress Notes (Signed)
PROGRESS NOTE   Subjective/Complaints: No further CP  Discussed anticoag management with daughter yesterday , pt is s/p ablation  for Afib several months ago, with post procedure monitoring showing no evidence of Afib, was place on ELiquis post procedure but plan per Dr Casper Harrison ([place]) was to d/c after a few months.  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 Subsequent encounter. EXAM: CT HEAD WITHOUT CONTRAST TECHNIQUE: Contiguous axial images were obtained from the base of the skull through the vertex without intravenous contrast. RADIATION DOSE REDUCTION: This exam was performed according to the departmental dose-optimization program which includes automated exposure control, adjustment of the mA and/or kV according to patient size and/or use of iterative reconstruction technique. COMPARISON:  Head CT 10/02/2021 and earlier. FINDINGS: Brain: Fading of the hyperdense hemorrhage centered at the right thalamus over the course of this month. Regional hypodensity, edema persists, mild regional mass effect seems diminished from 10/02/2021. No IVH or ventriculomegaly now. Moderate scattered additional bilateral cerebral white matter appears stable. No new cortically based infarct. Normal basilar cisterns. No midline shift. Vascular: No suspicious intracranial vascular hyperdensity. 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 No acute osseous abnormality identified. Sinuses/Orbits: Visualized paranasal sinuses and mastoids are stable and  well aerated. Other: Small right forehead benign scalp lipoma. No acute orbit or scalp soft tissue finding. IMPRESSION: 1. Fading blood products within the right thalamus. Regional hypodensity persists and might reflect a combination of residual edema and developing myelomalacia. Mild regional mass effect seems diminished since 10/02/2021. 2. No new intracranial abnormality. Electronically Signed   By: Genevie Ann M.D.   On: 10/11/2021 07:54   No results for input(s): "WBC", "HGB", "HCT", "PLT" in the last 72 hours.  No results for input(s): "NA", "K", "CL", "CO2", "GLUCOSE", "BUN", "CREATININE", "CALCIUM" in the last 72 hours.   Intake/Output Summary (Last 24 hours) at 10/11/2021 [id number] Last data filed at 10/10/2021 1755 Gross per 24 hour  Intake 720 ml  Output --  Net 720 ml         Physical Exam: Vital Signs Blood pressure (!) 113/54, pulse 84, temperature 97.9 F (36.6 C), resp. rate 16, height 5' 5.98" (1.676 m), weight 83.7 kg, SpO2 94 %.  General: No acute distress Mood and affect are flat Heart: Regular rate and rhythm no rubs murmurs or extra sounds Lungs: Clear to auscultation, breathing unlabored, no rales or wheezes Abdomen: Positive bowel sounds, soft nontender to palpation, nondistended Extremities: No clubbing, cyanosis, or edema Skin: No evidence of breakdown, no evidence of rash Neurologic: Cranial nerves II through XII intact, motor strength is 5/5 in right and 0/5 left deltoid, bicep, tricep, grip, hip flexor, knee extensors, ankle dorsiflexor and plantar flexor Trace left hip ext Sensory exam normal sensation to  light touch and proprioception in right and partial sensation to pinch  left  upper and lower extremities Negative SLR, pain with achilles stretching on left  Musculoskeletal: Full range of motion in all 4 extremities. No joint swelling , tender right parasternal    Assessment/Plan: 1. Functional deficits which require 3+ hours per day of interdisciplinary  therapy in a comprehensive inpatient rehab setting. Physiatrist is providing close team supervision and 24 hour management of active medical problems listed below. Physiatrist and rehab team continue to assess barriers to discharge/monitor patient progress toward functional and medical goals  Care Tool:  Bathing    Body parts bathed by patient: Chest, Abdomen, Right upper leg, Left upper leg, Left arm, Face   Body parts bathed by helper: Right arm, Front perineal area, Buttocks, Right lower leg, Left lower leg     Bathing assist Assist Level: Moderate Assistance - Patient 50 - 74%     Upper Body Dressing/Undressing Upper body dressing   What is the patient wearing?: Pull over shirt    Upper body assist Assist Level: Maximal Assistance - Patient 25 - 49%    Lower Body Dressing/Undressing Lower body dressing      What is the patient wearing?: Underwear/pull up, Pants     Lower body assist Assist for lower body dressing: Maximal Assistance - Patient 25 - 49%     Toileting Toileting Toileting Activity did not occur (Clothing management and hygiene only): N/A (no void or bm)  Toileting assist Assist for toileting: Maximal Assistance - Patient 25 - 49%     Transfers Chair/bed transfer  Transfers assist  Chair/bed transfer activity did not occur: Safety/medical concerns  Chair/bed transfer assist level: Moderate Assistance - Patient 50 - 74%     Locomotion Ambulation   Ambulation assist   Ambulation activity did not occur: Safety/medical concerns  Assist level: 2 helpers Assistive device: Lite Gait Max distance: 73ft   Walk 10 feet activity   Assist  Walk 10 feet activity did not occur: Safety/medical concerns        Walk 50 feet activity   Assist Walk 50 feet with 2 turns activity did not occur: Safety/medical concerns         Walk 150 feet activity   Assist Walk 150 feet activity did not occur: Safety/medical concerns         Walk 10  feet on uneven surface  activity   Assist Walk 10 feet on uneven surfaces activity did not occur: Safety/medical concerns         Wheelchair     Assist Is the patient using a wheelchair?: Yes Type of Wheelchair: Manual (TIS) Wheelchair activity did not occur: Safety/medical concerns         Wheelchair 50 feet with 2 turns activity    Assist    Wheelchair 50 feet with 2 turns activity did not occur: Safety/medical concerns       Wheelchair 150 feet activity     Assist  Wheelchair 150 feet activity did not occur: Safety/medical concerns       Blood pressure (!) 113/54, pulse 84, temperature 97.9 F (36.6 C), resp. rate 16, height 5' 5.98" (1.676 m), weight 83.7 kg, SpO2 94 %.  Medical Problem List and Plan: 1. 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  Have neuro review to see if Xarelto can be started              -  patient may  shower             -ELOS/Goals: 16-18d Sup/minA,   -Cont PT,OT,SLP  -Repeat CT around 10/11/21 some resolution of blood products, plan is to leave off Eliquis , f/u Dr Sandy Salaam, cardiology ([facility])  and Neurology   2.  Antithrombotics: -DVT/anticoagulation:  Mechanical: Antiembolism stockings, thigh (TED hose) Bilateral lower extremities             -antiplatelet therapy: N/A 3. Pain Management: Tylenol as needed, has burning discomfort on Left side , discomfort LLE likely due to Thalamic stroke, neg SLR , pain in L heel likely related to achilles stretch with hypersensitivity  Increase Gabapentin for neurogenic pain 300mg  QID   4. Mood/Sleep: Provide emotional support, pt frustrated with dependant role will stop buspirone and monitor              -antipsychotic agents: N/A 5. Neuropsych/cognition: This patient is capable of making decisions on her own behalf. 6. Skin/Wound Care: Routine skin  checks 7. Fluids/Electrolytes/Nutrition: Routine in and outs with follow-up chemistries 8.  Dysphagia.  Dysphagia #3 thin liquids 9.  Hypertension.  Cozaar 50 mg daily, Lopressor 25 mg twice daily.  Monitor with increased mobility Vitals:   10/10/21 1932 10/10/21 1933  BP:  (!) 113/54  Pulse: 88 84  Resp:  16  Temp:  97.9 F (36.6 C)  SpO2: 95% 94%   Well controlled overall, continue to follow  There were no vitals filed for this visit.  10.  PAF.  Eliquis on hold due to ICH.  Continue Lopressor.  Cardiac rate controlled.last 2 EKG were normal NSR, pt would like her cardiologist Dr 10/12/21 at [facility] to be notified of admit  11.  Obesity.  BMI 29.21.  Dietary follow-up 12.  History of recurrent UTI.  Prophylactic Macrodantin 50 mg daily.resume once pt done with Bactrim DS will do 7d tx >100K Kleb P  13.  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  Lethargic with poor sitting balance yesterday , repeat CT head unchanged  15.  Constipation , problem at times at home, drank mineral oil and coffee to help, type 5 BM on 6/24 -6/26 last documented 6/24, on senokot and miralax BID, consider additional med if no BM today 16.  Hot flashes , many years post menopausal , avoid estrogens, gabapentin may help but could require a higher dose   Neuro and Pharmacy discussed use of estrogen for hot flashes some benfit from premarin vaginal cream, discussed potential benefit of gabapentin but would need higher dose , increase to 300mg  TID, if helpful may d/c premarin  17.  Episode of CPx 1 resolved , exam has tenderness RIght parasternal , EKG and troponin negative, likely MSK  LOS: 10 days A FACE TO FACE EVALUATION WAS PERFORMED  7/24 10/11/2021, 8:22 AM

## 2021-10-11 NOTE — Progress Notes (Signed)
Occupational Therapy Session Note  Patient Details  Name: Charlotte Hunter MRN: [id number] Date of Birth: Mar 11, 1956  Today's Date: 10/11/2021 OT Individual Time: 6283-6629 OT Individual Time Calculation (min): 72 min    Short Term Goals: Week 2:  OT Short Term Goal 1 (Week 2): pt will engage in one grooming tasks from w/c at sink with MOD A OT Short Term Goal 2 (Week 2): pt will completed 1/3 toileting tasks with MAXA OT Short Term Goal 3 (Week 2): pt will complete sit>stand with MOD A  Skilled Therapeutic Interventions/Progress Updates:  Pt greeted supine in bed   agreeable to OT intervention. Session focus on BADL reeducation, functional mobility, dynamic standing balance and decreasing overall caregiver burden.      Pt completed supine>sit with MOD A doing a great job at boosting hips to advance RLE to EOB. Pt needed assist to elevate trunk with pt needing overall MOD A. Pt utilized stedy for toilet transfer as no +2 available. Pt able to stand to stedy with MOD A. Total A transfer to toilet in stedy, + BM with pt needing MAX A for 3/3 toileting tasks. Pt transferred to shower in stedy. Overall pt completed bathing with overall MAX A, utilized grab bar to suport LUE while pt washed LUE and arm pit. Pt needed assist to wash lower legs and buttocks from sitting.pt continues to have impaired sensation on L side but does report feeling like the grab bar was too cold on her L arm demonstrating improved sensation in LUE.  Used to exit shower with pt able to stand with MOD A. Total A transport back to bed. Pt completed dressing from bed d/t fatigue. MAX A overall for dressing with pt needing step by step cues to recall hemi technique for UB dressing. Utilized bridging for LB dressing with pt needing +2 to assist with pulling pants up while pt bridged. pt left supine in bed with bed alarm activated and all needs within reach with NT present.                 Therapy Documentation Precautions:   Precautions Precautions: Fall, Other (comment) Precaution Comments: L hemiplegia, L inattention, R hip and knee pain Restrictions Weight Bearing Restrictions: No   Pain: unrated pain reported in back, RN enter to provide meds.    Therapy/Group: Individual Therapy  Pollyann Glen [facility] 10/11/2021, 12:09 PM

## 2021-10-11 NOTE — Progress Notes (Signed)
Physical Therapy Session Note  Patient Details  Name: Charlotte Hunter MRN: [id number] Date of Birth: 1955-11-08  Today's Date: 10/11/2021 PT Individual Time: 1305-1410 and 1610-1703 PT Individual Time Calculation (min): 65 min and 53 min   Short Term Goals: Week 2:  PT Short Term Goal 1 (Week 2): Pt will perform supine<>sit with mod assist of 1 consistently PT Short Term Goal 2 (Week 2): Pt will perform sit<>stands using LRAD with mod assist of 1 PT Short Term Goal 3 (Week 2): Pt will perform bed<>chair transfers  using LRAD with mod assist of 1 PT Short Term Goal 4 (Week 2): Pt will ambulate at least 42ft using LRAD with +2 mod assist  Skilled Therapeutic Interventions/Progress Updates:    Session 1: Pt received supine in bed and agreeable to therapy session. Pt appears more fully awake and with increased energy at this time. Supine>sitting R EOB, HOB slightly elevated and using bedrail, via logroll technique to increase pt independence with light mod assist for L hemibody management and trunk upright - max cuing for sequencing. Sitting EOB with supervision for trunk control and pt using R UE support on bedrail as needed while donning socks and tennis shoes.   L squat pivot transfer EOB>w/c with mod assist for lifting/pivoting hips while providing dependent assist for L LE positioning and blocking L knee to promote WBing for NMR as well as ensuring safe L hand placement during tranfers - cuing for increased anterior trunk weight shift.  Transported to/from gym in w/c for time management and energy conservation.  Swapped out AFOs to trial the Matrix Max GRAFO on L LE - donned dependently.   Sit>stand w/c>R UE support on litegait handle progressed to performing with only max assist of 1 for lifting and balance while blocking L knee (continues to have compensatory R weight shift bias). Standing with mod assist of 1 while +2 assist donned litegait harness.  Gait training 22ft + 47ft in  litegait harness for safety but not providing BWS with L UE supported on handle via ACE wrap with the following cuing/manual facilitation:  - cuing for full R weight shift onto stance limb to allow more successful L swing phase advancement, improves to inconsistently advancing L LE 25-50% of the way <50% of the time during swing requiring total assist otherwise (pt sometimes starts to lean body forward towards L during L swing phase making it more difficulty for her to initiate hip flexion) - total assist manual facilitation for L hip/knee extension during stance although starting to feel intermittent trace quad activation - while also faciltiating L weight shift to promote longer stance time for increased R LE step length to achieve reciprocal pattern  -- pt becoming fatigued during 2nd walk and sat down in the litegait harness sling for support 2x stating her R leg was "getting tired and weak"  Doffed litegait harness. Transported back to room and pt persistently requesting to return to bed stating that she is not comfortable in the wheelchair and it makes her R hip and back hurt. R squat pivot w/c>EOB mod assist as described above. Sit>supine with mod assist for L hemibody management. Pt left supine in bed with needs in reach, her daughter present, and bed alarm on.   Session 2: Pt received supine in bed with her daughter, Tera, present and pt tearful with her and her daughter reporting pt has been having a realization of her impairments and current limitations and have been figuring out how to cope with  this sudden life change. Therapist provided emotional support and educated on Neuropsych services offered here - pt/daughter wanting this services therefore therapist reached out to SW to have this scheduled if possible. Pt's mood significantly improves throughout session with pt returning to her normal joking, jovial self. Pt reporting significant R hip pain and requesting medications - nurse notified.  Therapist performed L hip/knee PROM for tone management as pt continues to rest in excessive hip internal rotation and adduction with resistance to stretch felt moving into hip flexion, external rotation, and abduction.   Supine>sitting R EOB, HOB slightly elevated and using bedrail, via logroll technique to increase pt independence with light mod assist for L hemibody management and trunk upright requiring mod cuing for sequencing. Sitting EOB with supervision for trunk control and pt using R UE support on bedrail as needed while donning socks, tennis shoes, and L LE Matrix Max GRAFO dependently. L squat pivot transfer EOB>w/c with mod assist for lifting/pivoting hips while providing dependent assist for L LE positioning and blocking L knee to promote WBing for NMR as well as ensuring safe L hand placement during tranfers.  Pt reports need to use bathroom. Transported in/out bathroom in w/c.   L stand pivot w/c>BSC over toilet using R UE support on grab bar and max assist of 1 for lifting to stand, balance, blocking L knee buckle to allow pt to step with R LE and for repositioning L LE during the transfer. Standing with mod assist of 1 for balance while +2 provided dependent assist LB clothing management and peri-care. Pt continent of bladder and small BM. R stand pivot back to w/c continuing to use R UE support on grab bar and max assist as described above.  Transported to main therapy gym. Sit>stand from w/c to intermittent R HHA from +2 assist with max assist of 1 for lifting to stand, maintaining balance, and blocking L knee buckle - mirror feedback provided for pt to see her progress thus far for emotional support and to utilize as biofeedback for L LE muscle activation - pt continues to have strong R weight shift bias with minimal L LE WBing. Progressed to pre-gait L LE NMR training via forward/backwards stepping with R LE with therapist providing total assist to prevent L knee buckle and mod assist  for balance with +2 R HHA - cuing and facilitation for increased L glute and quad activation with small contraction palpated.  Transported back to room. R squat pivot transfer w/c>EOB with mod assist as described above. Sit>supine with mod assist for L hemibody management and using R UE support on bedrail for trunk descent. Performed supine bridging x5 reps focusing on L glute activation for NMR. At end of session, pt left supine in bed with needs in reach, her daughter present, and bed alarm on.   Therapy Documentation Precautions:  Precautions Precautions: Fall, Other (comment) Precaution Comments: L hemiplegia, L inattention, R hip and knee pain Restrictions Weight Bearing Restrictions: No   Pain: Session 1: Continues to have "burning" pain sensation in L hemibody with certain movements likely due to area of CVA causing abnormal sensory response - therapist supports L hemibody throughout session and assist pt with repositioning, distraction, and emotional support for pain management.  Session 2: Continues to have "burning" type pain with certain L hemibody movements due to the CVA - therapist provides support to L hemibody during mobility tasks for pain management as well as emotional support and distraction.    Therapy/Group: Individual Therapy  Ginny Forth , PT, DPT, NCS, CSRS 10/11/2021, 12:16 PM

## 2021-10-12 MED ORDER — NITROFURANTOIN MACROCRYSTAL 50 MG PO CAPS
50.0000 mg | ORAL_CAPSULE | Freq: Every day | ORAL | Status: DC
  Administered 2021-10-12 – 2021-10-20 (×9): 50 mg via ORAL
  Filled 2021-10-12 (×12): qty 1

## 2021-10-12 MED ORDER — LIDOCAINE 5 % EX PTCH
1.0000 | MEDICATED_PATCH | CUTANEOUS | Status: DC
  Administered 2021-10-12 – 2021-11-06 (×27): 1 via TRANSDERMAL
  Filled 2021-10-12 (×26): qty 1

## 2021-10-12 MED ORDER — DULOXETINE HCL 20 MG PO CPEP
20.0000 mg | ORAL_CAPSULE | Freq: Every day | ORAL | Status: DC
  Administered 2021-10-12 – 2021-11-06 (×26): 20 mg via ORAL
  Filled 2021-10-12 (×27): qty 1

## 2021-10-12 NOTE — Progress Notes (Addendum)
Physical Therapy Session Note  Patient Details  Name: Charlotte Hunter MRN: [id number] Date of Birth: [birth year]  Today's Date: 10/12/2021 PT Individual Time: 3557-3220 PT Individual Time Calculation (min): 28 min   Short Term Goals: Week 1:  PT Short Term Goal 1 (Week 1): Pt will perform supine<>sit with mod assist PT Short Term Goal 1 - Progress (Week 1): Progressing toward goal PT Short Term Goal 2 (Week 1): Pt will perform sit<>stands using LRAD with max assist PT Short Term Goal 2 - Progress (Week 1): Met PT Short Term Goal 3 (Week 1): Pt will perform bed<>chair transfers using LRAD with max assist PT Short Term Goal 3 - Progress (Week 1): Met PT Short Term Goal 4 (Week 1): Pt will participate in gait training of 14f using +2 assist and lift equipment as necessary to ensure pt safety PT Short Term Goal 4 - Progress (Week 1): Met Week 2:  PT Short Term Goal 1 (Week 2): Pt will perform supine<>sit with mod assist of 1 consistently PT Short Term Goal 2 (Week 2): Pt will perform sit<>stands using LRAD with mod assist of 1 PT Short Term Goal 3 (Week 2): Pt will perform bed<>chair transfers  using LRAD with mod assist of 1 PT Short Term Goal 4 (Week 2): Pt will ambulate at least 5100fusing LRAD with +2 mod assist  Skilled Therapeutic Interventions/Progress Updates:   Received pt supine in bed reporting waiting on RN to assess BP. Pt reported after lunch her vision became blurry and felt like she was going to pass out - vision improved when pt put on glasses. Pt reported having a headache and requesting to rest, but denied any pain. Consulted with RN who reported pt was c/o numbness in 5th digit of L hand and vitals assessed and WNL and PA, Dan, aware and present during session with no concerns. BP: 119/54 and HR 77bpm but pt requesting manual BP reading: 104/60. Pt reported urge to void and transferred supine<>sitting EOB with min A and c/o "lightheadedness" that resolved with rest. Donned  shoes and L AFO with max A and pt transferred bed<>bedside commode with max A +2 for safety. Stood from commode with max A and required +2 assist for clothing management. Concluded session with pt sitting on commode left in care of 2 NTs due to time restrictions.   Therapy Documentation Precautions:  Precautions Precautions: Fall, Other (comment) Precaution Comments: L hemiplegia, L inattention, R hip and knee pain Restrictions Weight Bearing Restrictions: No  Therapy/Group: Individual Therapy AnAlfonse AlpersT, DPT  10/12/2021, 7:03 AM

## 2021-10-12 NOTE — Plan of Care (Signed)
  Problem: Consults Goal: RH STROKE PATIENT EDUCATION Description: See Patient Education module for education specifics  Outcome: Progressing   Problem: RH SAFETY Goal: RH STG ADHERE TO SAFETY PRECAUTIONS W/ASSISTANCE/DEVICE Description: STG Adhere to Safety Precautions With cues Assistance/Device. Outcome: Progressing   Problem: RH KNOWLEDGE DEFICIT Goal: RH STG INCREASE KNOWLEDGE OF STROKE PROPHYLAXIS Description: Patient and dtr will be able to manage secondary risks with medications and dietary modifications using handouts and educational resources independently Outcome: Progressing

## 2021-10-12 NOTE — Progress Notes (Signed)
Occupational Therapy Session Note  Patient Details  Name: Charlotte Hunter MRN: [id number] Date of Birth: May 09, 1955  Today's Date: 10/12/2021 OT Individual Time: 1941-7408 OT Individual Time Calculation (min): 32 min  and Today's Date: 10/12/2021 OT Missed Time: 43 Minutes Missed Time Reason: Patient ill (comment);Other (comment) (feeling weak, reporting HA and general malaise)   Short Term Goals: Week 2:  OT Short Term Goal 1 (Week 2): pt will engage in one grooming tasks from w/c at sink with MOD A OT Short Term Goal 2 (Week 2): pt will completed 1/3 toileting tasks with MAXA OT Short Term Goal 3 (Week 2): pt will complete sit>stand with MOD A  Skilled Therapeutic Interventions/Progress Updates:  Pt greeted supine in bed reporting that she wasn't feeling well reporting increased HR and a HA. Alerted RN who entered to take BP with readings indicated below: Dinamap reading 150/70, manual reading 138/64.HR 86 bpm. Education provided on manual reading being normal as well as all other VS. Pt continues to be anxious stating she "just needs to calm down." OTA exited room with RT and gave pt time to "relax." Returned 5 mins later with pt continuing to be concerned that "something was wrong." Provided therapeutic use of self and active listening with pt very difficult to redirect distracted by extraneous stimuli such as her sleeve being too tight on the R arm even though pt c/o numbness in R hand per RN note. Attempted to work on deep breathing with little success to manage anxiety.  Education provided on general POC of trying to work on therapy as much as possible to allow pt to be as independent as possible prior to DC to SNF to decrease caregiver burden. Pt verbalizes understanding but still declines session. Pt left supine in bed with all needs within reach and bed alarm activated, alerted RN that pt continuing to ask for PA to assess her.    Therapy Documentation Precautions:   Precautions Precautions: Fall, Other (comment) Precaution Comments: L hemiplegia, L inattention, R hip and knee pain Restrictions Weight Bearing Restrictions: No  Pain: unrated HA pain utilized distraction as pain mgmt strategy.     Therapy/Group: Individual Therapy  Barron Schmid 10/12/2021, 3:36 PM

## 2021-10-12 NOTE — Progress Notes (Signed)
Speech Language Pathology Daily Session Note  Patient Details  Name: Charlotte Hunter MRN: [id number] Date of Birth: Aug 22, 1955  Today's Date: 10/12/2021 SLP Individual Time: 0240-9735 SLP Individual Time Calculation (min): 42 min  Short Term Goals: Week 2: SLP Short Term Goal 1 (Week 2): STG=LTG due to ELOS  Skilled Therapeutic Interventions: Skilled ST treatment focused on cognitive goals. Pt was semi-reclined in bed on arrival and complained of generalized discomfort. RN made aware. SLP facilitated repositioning in bed by placing pillow underneath buttocks which improved comfort. Pt was emotional during session but stated it was out of gratitude for positive experience in rehab and friend/family support. SLP initiated cognitive goal for awareness due to PT/OT report of decreased insight into deficits/awareness during functional therapy tasks. SLP facilitated discussion re: intellectual awareness of deficits. Pt was able to provide adequate insight and detail of physical changes s/p CVA and how they are affecting her current mobility with mod I. Pt continues to deny any cognitive changes. SLP then facilitated functional discussion re: emergent awareness of impairments and highlighted appropriate modifications/adaptations, equipment needs, etc with sup A verbal cues. In a structured context, pt demonstrates appropriate intellectual and emergent awareness of deficits. Anticipate pt will require supervision at discharge due to report of inconsistent awareness during therapy tasks, dual tasking, unstructured contexts. Cont to address cognitive goals 1x for carry over prior to consideration of discharging from speech services. Patient was left in bed with alarm activated and immediate needs within reach at end of session. Continue per current plan of care.       Pain Pain Assessment Pain Scale: 0-10 Pain Score: 4  Pain Type: Chronic pain Pain Location: Hip Pain Orientation: Right Pain Descriptors /  Indicators: Aching;Discomfort;Pressure Pain Onset: On-going Pain Intervention(s): Medication (See eMAR);Repositioned;Emotional support Multiple Pain Sites: No  Therapy/Group: Individual Therapy  Labradford Schnitker T Kaien Pezzullo 10/12/2021, 1:30 PM

## 2021-10-12 NOTE — Discharge Summary (Signed)
Physician Discharge Summary  Patient ID: Charlotte Hunter MRN: [id number] DOB/AGE: 07-09-1955 66 y.o.  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 Subsequent encounter. EXAM: CT HEAD WITHOUT CONTRAST TECHNIQUE: Contiguous axial images were obtained from the base of the skull through the vertex without intravenous contrast. RADIATION DOSE REDUCTION: This exam was performed according to the departmental dose-optimization program which includes automated exposure control, adjustment of the mA and/or kV according to patient size and/or use of iterative reconstruction technique. COMPARISON:  Head CT 10/02/2021 and earlier. FINDINGS: Brain: Fading of the hyperdense hemorrhage centered at the right thalamus over the course of this month. Regional hypodensity, edema persists, mild regional mass effect seems diminished from 10/02/2021. No IVH or ventriculomegaly now. Moderate scattered additional bilateral cerebral white matter appears stable. No new cortically based infarct. Normal basilar cisterns. No midline shift. Vascular: No suspicious intracranial vascular hyperdensity. 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 No acute osseous abnormality identified. Sinuses/Orbits: Visualized paranasal sinuses and mastoids are stable and well aerated. Other: Small right forehead benign scalp lipoma. No acute orbit or scalp soft tissue finding. IMPRESSION: 1. Fading blood products within the right thalamus. Regional hypodensity  persists and might reflect a combination of residual edema and developing myelomalacia. Mild regional mass effect seems diminished since 10/02/2021. 2. No new intracranial abnormality. Electronically Signed   By: Odessa Fleming M.D.   On: 10/11/2021 07:54    Labs:  Basic Metabolic Panel: Recent Labs  Lab 10/31/21 0616  NA 139  K 3.6  CL 106  CO2 25  GLUCOSE 111*  BUN 13  CREATININE 0.61  CALCIUM 9.8    CBC: No results for input(s): "WBC", "NEUTROABS", "HGB", "HCT", "MCV", "PLT" in the last 168 hours.  CBG: No results for input(s): "GLUCAP" in the last 168 hours.  Brief HPI:   Charlotte Hunter is a 66 y.o. right-handed female with history of hypertension, recurrent UTIs maintained on prophylactic Macrodantin aortic insufficiency, PAF with history of ablation maintained on Eliquis.  Her latest cardiology notes that she was planning Xarelto to see if this may be more affordable for her however it was never started and she was maintained on Eliquis.  Per chart review patient lives alone.  Daughter in the area.  Presented 09/25/2021 with acute onset of left-sided weakness as well as blurred vision.  CT of the head showed hemorrhage centered in the right thalamus and basal ganglia with mild surrounding edema, minimal midline shift and a small amount of intraventricular extension into the third and fourth ventricle.  Patient was reversed with Andexxa.  Follow-up MRI/MRA unchanged size of morphology of acute intraparenchymal hemorrhage.  No hydrocephalus or trapping.  There was a noted focal severe distal left P2 stenosis on MRA.  Admission chemistries unremarkable except potassium 3.2 urinalysis negative.  Echocardiogram with ejection fraction of 65 to 70% no wall motion abnormalities.  Patient did require short-term intubation through 09/27/2021.  Maintained on a mechanical soft nectar thick liquid diet and advance to thin liquids 10/01/2021.  Therapy evaluations completed due to patient decreased functional  mobility was admitted for a comprehensive rehab program.   Hospital Course: [facility]  [place] was admitted to rehab 10/01/2021 for inpatient therapies to consist of PT, ST and OT at least three hours five days a week. Past admission physiatrist, therapy team and rehab RN have worked together to provide customized collaborative inpatient rehab.  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  Repeat CT of the head 10/11/2021 showed fading blood products within the right thalamus.  Regional hypodensity persist in my reflect a combination of residual edema and developing myelomalacia.  Mild regional mass effect diminished since 10/02/2021.  No new intracranial abnormality.  Patient noted history of PAF at this time would remain off of Eliquis and follow-up with cardiology services Dr.Komado of [facility].  Cardiac rate remained controlled.  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  Her diet was advanced to mechanical soft thin liquids tolerated nicely.  Blood pressure controlled on Cozaar as well as Lopressor again patient would follow-up outpatient.  Obesity BMI 29.21 with dietary follow-up.  Recurrent UTI latest urinalysis Klebsiella UTI patient had been on prophylactic Bactrim prior to admission.  Bouts of constipation resolved with laxative assistance.   Blood pressures were monitored on TID basis and soft and monitored     Rehab course: During patient's stay in rehab weekly team conferences were held to monitor patient's progress, set goals and discuss barriers to discharge. At admission, patient required max assist sit to stand max assist supine to sit  Physical exam.  Blood pressure 127/68 pulse 89 temperature 99 respirations 18  oxygen saturation 93% room air Neurologic.  Cranial nerves II through XII intact, motor strength 5/5 in right and 0/5 left deltoid, bicep, tricep, grip, hip flexors, knee extensors, ankle dorsi plantarflexion.  Sensory exam normal sensation to light touch in the right and reduced left upper left lower extremity HEENT Head.  Normocephalic and atraumatic Eyes.  Pupils round and reactive to light without discharge no nystagmus Neck.  Supple nontender no JVD without thyromegaly Cardiac regular rate and rhythm without any extra sounds or murmur heard Abdomen.  Soft nontender positive bowel sounds without rebound Respiratory effort normal no respiratory distress without wheeze Extremities.  No clubbing cyanosis or edema  He/She  has had improvement in activity tolerance, balance, postural control as well as ability to compensate for deficits. He/She has had improvement in functional use RUE/LUE  and RLE/LLE as well as improvement in awareness.  Supine-sitting right edge of bed, head of bed elevated slightly elevated using bed rail via logroll technique to increase patient independence with light mod assist for left hemibody management and trunk upright-max cueing for sequencing.  Left squat pivot transfers edge of bed wheelchair with moderate assist for lifting pivoting hips.  Gait 55 feet in light gait harness for safety.  ADL sessions focused on education functional mobility standing dynamic balance.  Patient completed supine to sit with moderate assist doing a great job with boosting hips to advance right lower extremity edge of bed.  Patient needed assist to elevate trunk when patient needing overall moderate assist.  Patient able to stand with moderate assist total assist transfer to toilet.  Patient completed dressing from bed D/T fatigue.  Max assist overall for dressing and patient needing step-by-step cues to recall hemitechnique for upper body dressing.  Utilize bridging for lower body dressing with  patient  needing plus to assist with pulling pants up while patient bridged.  After multiple discussions with daughter and patient in regards to discharge planning they felt they can provide the necessary physical assistance at home.  Full family teaching completed plan discharged home       Disposition: Discharge to home    Diet: Regular  Special Instructions: No driving smoking or alcohol  Continue to hold Eliquis for now until follow-up cardiology services as well as neurology  Medications at discharge 1.  Tylenol as needed 2.  Premarin vaginal cream 1 applicator vaginally daily 3.  Voltaren gel 2 g as needed joint pain 4.  Neurontin 100 mg p.o. 3 times daily 5.  Claritin 10 mg daily as needed 6.  Cozaar 50 mg p.o. daily 7.  Melatonin 3 mg p.o. nightly 8.  Robaxin 500 mg every 8 hours as needed muscle spasms 9.  Lopressor 25 mg p.o. twice daily 10.  Multivitamin daily 11.  Protonix 40 mg p.o. nightly 12.  MiraLAX daily as needed 13.  Flomax 0.4 mg p.o. daily 14.  Zanaflex 2 mg p.o. nightly 15.  Crestor 20 mg p.o. daily 16.  Macrodantin 50 mg p.o. daily prophylactically 17.  Lidoderm patch change as directed 18.  Cymbalta 20 mg p.o. daily 19.  Zanaflex 2 mg p.o. nightly  30-35 minutes were spent completing discharge summary and discharge planning  Discharge Instructions     Ambulatory referral to Neurology   Complete by: As directed    An appointment is requested in approximately: 4 weeks [place]   Ambulatory referral to Physical Medicine Rehab   Complete by: As directed    Follow-up 1 month right thalamic ICH.  Patient for skilled nursing facility placement        Follow-up Information     Kirsteins, Luanna Salk, MD Follow up.   Specialty: Physical Medicine and Rehabilitation Why: Office to call for appointment Contact information: [place] [address] [phone]         Vergia Alcon, MD Follow up.   Specialty:  Cardiology Why: call for appointment Contact information: [place] [address] [phone]                 Signed: Cathlyn Parsons 11/05/2021, 3:02 PM

## 2021-10-12 NOTE — Plan of Care (Signed)
  Problem: RH Awareness Goal: LTG: Patient will demonstrate awareness during functional activites type of (SLP) Description: LTG: Patient will demonstrate awareness during functional activites type of (SLP) Flowsheets (Taken 10/12/2021 1329) Patient will demonstrate during cognitive/linguistic activities awareness type of: Emergent LTG: Patient will demonstrate awareness during cognitive/linguistic activities with assistance of (SLP): Supervision

## 2021-10-12 NOTE — Progress Notes (Signed)
Patient continues to call staff into her room. C/O her pinky finger feeling numb and afraid she is having another stroke. Advised patient to move her fingers around. Dan PA notified no new orders. Patient then requested her BP to be monitored. Dinamap reading 150/70, manual reading 138/64. Patient requested additional pain medications. Patient educated that she was not in need of any more BP medications. Patient then asked for pain medications. Educated patient that she had no additional pain medications to be given art this time.

## 2021-10-12 NOTE — Progress Notes (Signed)
Physical Therapy Session Note  Patient Details  Name: Charlotte Hunter MRN: [id number] Date of Birth: 1956/01/18  Today's Date: 10/12/2021 PT Individual Time: 0800-0858 PT Individual Time Calculation (min): 58 min   Short Term Goals: Week 2:  PT Short Term Goal 1 (Week 2): Pt will perform supine<>sit with mod assist of 1 consistently PT Short Term Goal 2 (Week 2): Pt will perform sit<>stands using LRAD with mod assist of 1 PT Short Term Goal 3 (Week 2): Pt will perform bed<>chair transfers  using LRAD with mod assist of 1 PT Short Term Goal 4 (Week 2): Pt will ambulate at least 38ft using LRAD with +2 mod assist  Skilled Therapeutic Interventions/Progress Updates:      Pt resting in bed to start and agreeable to therapy session. Reports chronic pains that the nurse provided pain medication and topical creams for. Dtr entering room at start of session as well. Pt requesting female staff to assist with dressing but she also reported need to void and have BM.  Supine<>sitting EOB with modA for log rolling, assist for trunk and BLE management. She sit's unsupported at EOB with CGA.   Stedy used for urgency for transfer to the toilet. Completes sit<>stand using the stedy with minA for powering to rise. CGA for sitting balance in perched position. Transferred to 3-1 over toilet in [place] and assist needed for brief management and controlled lowering to toilet.   Pt continent of B&B, charted in flowsheets. Female rehab tech assisting with pericare, UB, and LB dressing per pt request. During this, PT discussed with daughter DC planning, PT goals, pt's current mobility, etc. Daughter asking for extension in CIR rather than DCing to SNF - explained potential barriers to this from PT standpoint. Deferring to primary PT.   Stedy transfer to w/c with dependant assist for time management. Donned socks, tennis shoes, and L GRAFO with totalA for time management. Transported to day room rehab gym in w/c    Setup in [place] with resistance set to 30cm/sec. AAROM using her RUE to assist with engaging adequate L hip extensors. Completed x5 minutes. PT also assisting with L knee alignment for neutral positioning.  Transported back to her room. MD at bedside speaking with daughter. NT completing linen change. Handoff of care with pt sitting comfortably in w/c, reclined slightly in manual TIS.    Therapy Documentation Precautions:  Precautions Precautions: Fall, Other (comment) Precaution Comments: L hemiplegia, L inattention, R hip and knee pain Restrictions Weight Bearing Restrictions: No General:    Therapy/Group: Individual Therapy  Orrin Brigham 10/12/2021, 7:40 AM

## 2021-10-12 NOTE — Progress Notes (Signed)
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 Subsequent encounter. EXAM: CT HEAD WITHOUT CONTRAST TECHNIQUE: Contiguous axial images were obtained from the base of the skull through the vertex without intravenous contrast. RADIATION DOSE REDUCTION: This exam was performed according to the departmental dose-optimization program which includes automated exposure control, adjustment of the mA and/or kV according to patient size and/or use of iterative reconstruction technique. COMPARISON:  Head CT 10/02/2021 and earlier. FINDINGS: Brain: Fading of the hyperdense hemorrhage centered at the right thalamus over the course of this month. Regional hypodensity, edema persists, mild regional mass effect seems diminished from 10/02/2021. No IVH or ventriculomegaly now. Moderate scattered additional bilateral cerebral white matter appears stable. No new cortically based infarct. Normal basilar cisterns. No midline shift. Vascular: No suspicious intracranial vascular hyperdensity. 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 No acute osseous abnormality identified. Sinuses/Orbits: Visualized paranasal sinuses and mastoids are stable and well aerated. Other: Small right forehead benign scalp lipoma. No acute orbit or scalp soft tissue finding. IMPRESSION: 1. Fading blood products within the right thalamus. Regional hypodensity persists and might reflect a combination of residual edema and developing myelomalacia. Mild regional mass effect seems  diminished since 10/02/2021. 2. No new intracranial abnormality. Electronically Signed   By: Genevie Ann M.D.   On: 10/11/2021 07:54   No results for input(s): "WBC", "HGB", "HCT", "PLT" in the last 72 hours.  No results for input(s): "NA", "K", "CL", "CO2", "GLUCOSE", "BUN", "CREATININE", "CALCIUM" in the last 72 hours.   Intake/Output Summary (Last 24 hours) at 10/12/2021 0847 Last data filed at 10/12/2021 0729 Gross per 24 hour  Intake 1316 ml  Output --  Net 1316 ml         Physical Exam: Vital Signs Blood pressure 119/66, pulse 87, temperature 97.7 F (36.5 C), resp. rate 15, height 5' 5.98" (1.676 m), weight 83.7 kg, SpO2 92 %.  General: No acute distress Mood and affect are flat Heart: Regular rate and rhythm no rubs murmurs or extra sounds Lungs: Clear to auscultation, breathing unlabored, no rales or wheezes Abdomen: Positive bowel sounds, soft nontender to palpation, nondistended Extremities: No clubbing, cyanosis, or edema Skin: No evidence of breakdown, no evidence of rash Neurologic: Cranial nerves II through XII intact, motor strength is 5/5 in right and 0/5 left deltoid, bicep, tricep, grip, hip flexor, knee extensors, ankle dorsiflexor and plantar flexor Trace left hip ext Sensory exam normal sensation to light touch and proprioception in right and partial sensation to pinch  left  upper and lower extremities Negative SLR, pain with achilles stretching on left  Musculoskeletal: Full range of motion in all 4 extremities. No joint swelling , tender right parasternal    Assessment/Plan: 1. Functional deficits which require 3+ hours per day  of interdisciplinary therapy in a comprehensive inpatient rehab setting. Physiatrist is providing close team supervision and 24 hour management of active medical problems listed below. Physiatrist and rehab team continue to assess barriers to discharge/monitor patient progress toward functional and medical goals  Care  Tool:  Bathing    Body parts bathed by patient: Left arm, Chest, Abdomen, Front perineal area, Face, Right upper leg, Left upper leg   Body parts bathed by helper: Right arm, Buttocks, Right lower leg, Left lower leg     Bathing assist Assist Level: Maximal Assistance - Patient 24 - 49%     Upper Body Dressing/Undressing Upper body dressing   What is the patient wearing?: Pull over shirt    Upper body assist Assist Level: Maximal Assistance - Patient 25 - 49%    Lower Body Dressing/Undressing Lower body dressing      What is the patient wearing?: Underwear/pull up, Pants     Lower body assist Assist for lower body dressing: Maximal Assistance - Patient 25 - 49% (bed level)     Toileting Toileting Toileting Activity did not occur (Clothing management and hygiene only): N/A (no void or bm)  Toileting assist Assist for toileting: Maximal Assistance - Patient 25 - 49%     Transfers Chair/bed transfer  Transfers assist  Chair/bed transfer activity did not occur: Safety/medical concerns  Chair/bed transfer assist level: Moderate Assistance - Patient 50 - 74% (squat pivot)     Locomotion Ambulation   Ambulation assist   Ambulation activity did not occur: Safety/medical concerns  Assist level: 2 helpers Assistive device: Lite Gait Max distance: 39ft   Walk 10 feet activity   Assist  Walk 10 feet activity did not occur: Safety/medical concerns        Walk 50 feet activity   Assist Walk 50 feet with 2 turns activity did not occur: Safety/medical concerns         Walk 150 feet activity   Assist Walk 150 feet activity did not occur: Safety/medical concerns         Walk 10 feet on uneven surface  activity   Assist Walk 10 feet on uneven surfaces activity did not occur: Safety/medical concerns         Wheelchair     Assist Is the patient using a wheelchair?: Yes Type of Wheelchair: Manual (TIS) Wheelchair activity did not occur:  Safety/medical concerns         Wheelchair 50 feet with 2 turns activity    Assist    Wheelchair 50 feet with 2 turns activity did not occur: Safety/medical concerns       Wheelchair 150 feet activity     Assist  Wheelchair 150 feet activity did not occur: Safety/medical concerns       Blood pressure 119/66, pulse 87, temperature 97.7 F (36.5 C), resp. rate 15, height 5' 5.98" (1.676 m), weight 83.7 kg, SpO2 92 %.  Medical Problem List and Plan: 1. 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  Antithrombotics: -DVT/anticoagulation:  Mechanical: Antiembolism  stockings, thigh (TED hose) Bilateral lower extremities             -antiplatelet therapy: N/A 3. Pain Management: Tylenol as needed, has burning discomfort on Left side , discomfort LLE likely due to Thalamic stroke, neg SLR , pain in L heel likely related to achilles stretch with hypersensitivity  Increase Gabapentin for neurogenic pain 300mg  QID  Fibromyalgia add low dose duloxetine 4. Mood/Sleep: Provide emotional support, pt frustrated with dependant role will stop buspirone and monitor              -antipsychotic agents: N/A 5. Neuropsych/cognition: This patient is capable of making decisions on her own behalf. 6. Skin/Wound Care: Routine skin checks 7. Fluids/Electrolytes/Nutrition: Routine in and outs with follow-up chemistries 8.  Dysphagia.  Dysphagia #3 thin liquids 9.  Hypertension.  Cozaar 50 mg daily, Lopressor 25 mg twice daily.  Monitor with increased mobility Vitals:   10/11/21 1951 10/12/21 0610  BP: (!) 108/54 119/66  Pulse: 74 87  Resp: 14 15   Temp: 98 F (36.7 C) 97.7 F (36.5 C)  SpO2: 90% 92%   Well controlled overall, continue to follow  There were no vitals filed for this visit.  10.  PAF.  Eliquis on hold due to ICH.  Continue Lopressor.  Cardiac rate controlled.last 2 EKG were normal NSR, pt would like her cardiologist Dr 10/14/21 at [facility] to be notified of admit  11.  Obesity.  BMI 29.21.  Dietary follow-up 12.  History of recurrent UTI.  Prophylactic Macrodantin 50 mg daily.resume once pt done with Bactrim DS will do 7d tx >100K Kleb P  13.  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  Lethargic with poor sitting balance yesterday , repeat CT head unchanged  15.  Constipation , problem at times at home, drank mineral oil and coffee to help, type 5 BM on 6/24 -6/26 last documented 6/24, on senokot and miralax BID, consider additional med if no BM today 16.  Hot flashes , many years post menopausal , avoid estrogens, gabapentin may help but could require a higher dose   Neuro and Pharmacy discussed use of estrogen for hot flashes some benfit from premarin vaginal cream, discussed potential benefit of gabapentin but would need higher dose , increase to 300mg  TID, if helpful may d/c premarin  17.  Episode of CPx 1 resolved , exam has tenderness RIght parasternal , EKG and troponin negative, likely MSK  LOS: 11 days A FACE TO FACE EVALUATION WAS PERFORMED  7/24 10/12/2021, 8:47 AM

## 2021-10-13 LAB — CBC
HCT: 35.4 % — ABNORMAL LOW (ref 36.0–46.0)
Hemoglobin: 11.7 g/dL — ABNORMAL LOW (ref 12.0–15.0)
MCH: 30.1 pg (ref 26.0–34.0)
MCHC: 33.1 g/dL (ref 30.0–36.0)
MCV: 91 fL (ref 80.0–100.0)
Platelets: 366 10*3/uL (ref 150–400)
RBC: 3.89 MIL/uL (ref 3.87–5.11)
RDW: 13.7 % (ref 11.5–15.5)
WBC: 6.6 10*3/uL (ref 4.0–10.5)
nRBC: 0 % (ref 0.0–0.2)

## 2021-10-13 MED ORDER — TAMSULOSIN HCL 0.4 MG PO CAPS
0.8000 mg | ORAL_CAPSULE | Freq: Every day | ORAL | Status: DC
  Administered 2021-10-13 – 2021-10-14 (×2): 0.8 mg via ORAL
  Filled 2021-10-13 (×2): qty 2

## 2021-10-13 NOTE — Progress Notes (Signed)
Patient last voided at 2219. Patient assisted up to bedside commode. Patient unable to urinate at this time. Bladder scan volume is 346 ml.  Pt educated on risk and benefits of  in and out cath to empty bladder. Patient verbalizes understanding. Patient refuses to have in and out cath at this time. Pt states " I will try to pee again in a little while". On-coming RN made aware. Dr. Berline Chough made aware.

## 2021-10-13 NOTE — Progress Notes (Signed)
Notified by night RN, Lauren that patient was retaining urine but not wanting to have I/O cath.  Discussed with Dr. Berline Chough, MD who recommended suprapubic tapping.  This was done by day shift RN, Josh, which did not help with voiding.  I-O cath performed by nurse tech.  Will continue to bladder scan and trend.  Patient also expressed feeling lightheaded and dizzy today.  VS normal but will continue to monitor.

## 2021-10-13 NOTE — Progress Notes (Signed)
PROGRESS NOTE   Subjective/Complaints:  Notified by night RN that patient was retaining urine but not wanting to have I/O cath.  Suprapubic tapping done by day shift nurse, without result. I/O performed.  Patient expressing anxiety, also some lightheadedness/dizziness.  VS normal.  ROS: Negative CP, SOB, N/V/C/D, says some tingling with urination, but no dysuria, fever, or chills. + Intermittent anxiety, lightheadedness, +urinary retention   Objective:   No results found. Recent Labs    10/13/21 1113  WBC 6.6  HGB 11.7*  HCT 35.4*  PLT 366   No results for input(s): "NA", "K", "CL", "CO2", "GLUCOSE", "BUN", "CREATININE", "CALCIUM" in the last 72 hours.   Intake/Output Summary (Last 24 hours) at 10/13/2021 1426 Last data filed at 10/13/2021 1300 Gross per 24 hour  Intake 657 ml  Output 1125 ml  Net -468 ml        Physical Exam: Vital Signs Blood pressure (!) 166/84, pulse 91, temperature 97.6 F (36.4 C), temperature source Oral, resp. rate 18, height 5' 5.98" (1.676 m), weight 83.7 kg, SpO2 94 %.   General: Alert and oriented x 3, No apparent distress.  HEENT: Head is normocephalic, atraumatic, PERRLA, EOMI, sclera anicteric, oral mucosa pink and moist, dentition intact, ext ear canals clear,  Neck: Supple without JVD or lymphadenopathy Heart: Reg rate and rhythm. No murmurs rubs or gallops Chest: CTA bilaterally without wheezes, rales, or rhonchi; no distress Abdomen: Soft, non-tender, non-distended, bowel sounds positive. GU: Urinary retention requiring bladder scans, I/O caths Extremities: No clubbing, cyanosis, or edema. Pulses are 2+ Psych: Mood/affect flat.  Anxious. Pt's affect is appropriate. Pt is cooperative Skin: Clean and intact without signs of breakdown Neuro: Cranial nerves II through XII intact, motor strength is 5/5 in right and 0/5 left deltoid, bicep, tricep, grip, hip flexor or, knee extensors,  ankle dorsiflexor and plantar flexor.  Trace left hip ext.  Sensory exam normal to light touch and proprioception in right and partial sensation to pinch left upper and lower extremities.  Negative SLR, pain with Achilles stretching on left. Some dizziness today. Musculoskeletal: FROM all 4 extremities. No joint swelling, tender right parasternal   Assessment/Plan: 1. Functional deficits which require 3+ hours per day of interdisciplinary therapy in a comprehensive inpatient rehab setting. Physiatrist is providing close team supervision and 24 hour management of active medical problems listed below. Physiatrist and rehab team continue to assess barriers to discharge/monitor patient progress toward functional and medical goals  Care Tool:  Bathing    Body parts bathed by patient: Left arm, Chest, Abdomen, Front perineal area, Face, Right upper leg, Left upper leg   Body parts bathed by helper: Right arm, Buttocks, Right lower leg, Left lower leg     Bathing assist Assist Level: Maximal Assistance - Patient 24 - 49%     Upper Body Dressing/Undressing Upper body dressing   What is the patient wearing?: Pull over shirt    Upper body assist Assist Level: Maximal Assistance - Patient 25 - 49%    Lower Body Dressing/Undressing Lower body dressing      What is the patient wearing?: Underwear/pull up, Pants     Lower body assist Assist for  lower body dressing: Maximal Assistance - Patient 25 - 49% (bed level)     Toileting Toileting Toileting Activity did not occur (Clothing management and hygiene only): N/A (no void or bm)  Toileting assist Assist for toileting: Maximal Assistance - Patient 25 - 49%     Transfers Chair/bed transfer  Transfers assist  Chair/bed transfer activity did not occur: Safety/medical concerns  Chair/bed transfer assist level: Moderate Assistance - Patient 50 - 74% (squat pivot)     Locomotion Ambulation   Ambulation assist   Ambulation  activity did not occur: Safety/medical concerns  Assist level: 2 helpers Assistive device: Lite Gait Max distance: 55ft   Walk 10 feet activity   Assist  Walk 10 feet activity did not occur: Safety/medical concerns        Walk 50 feet activity   Assist Walk 50 feet with 2 turns activity did not occur: Safety/medical concerns         Walk 150 feet activity   Assist Walk 150 feet activity did not occur: Safety/medical concerns         Walk 10 feet on uneven surface  activity   Assist Walk 10 feet on uneven surfaces activity did not occur: Safety/medical concerns         Wheelchair     Assist Is the patient using a wheelchair?: Yes Type of Wheelchair: Manual (TIS) Wheelchair activity did not occur: Safety/medical concerns         Wheelchair 50 feet with 2 turns activity    Assist    Wheelchair 50 feet with 2 turns activity did not occur: Safety/medical concerns       Wheelchair 150 feet activity     Assist  Wheelchair 150 feet activity did not occur: Safety/medical concerns       Blood pressure (!) 166/84, pulse 91, temperature 97.6 F (36.4 C), temperature source Oral, resp. rate 18, height 5' 5.98" (1.676 m), weight 83.7 kg, SpO2 94 %.  Medical Problem List and Plan: 1. 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  Antithrombotics: -DVT/anticoagulation:  Mechanical: Antiembolism stockings, thigh (TED hose) Bilateral lower extremities             -antiplatelet therapy: N/A 3. Pain Management: Tylenol as needed, has burning discomfort on Left side , discomfort LLE likely due to Thalamic stroke,  neg SLR , pain in L heel likely related to achilles stretch with hypersensitivity  Increase Gabapentin for neurogenic pain 300mg  QID  Fibromyalgia add low dose duloxetine 7/1 Continue 300 mg gabapentin 300 mg QID, duloxetine 20 mg 4. Mood/Sleep: Provide emotional support, pt frustrated with dependant role will stop buspirone and monitor              -antipsychotic agents: N/A 5. Neuropsych/cognition: This patient is capable of making decisions on her own behalf. 6. Skin/Wound Care: Routine skin checks 7. Fluids/Electrolytes/Nutrition: Routine in and outs with follow-up chemistries 8.  Dysphagia.  Dysphagia #3 thin liquids 9.  Hypertension.  Cozaar 50 mg daily, Lopressor 25 mg twice daily.  Monitor with  increased mobility Vitals:   10/13/21 0501 10/13/21 0901  BP: (!) 122/48 (!) 166/84  Pulse: 60 91  Resp: 15 18  Temp: 97.7 F (36.5 C) 97.6 F (36.4 C)  SpO2: 95% 94%   Well controlled overall, continue to follow 7/1 Some spikes in BP unsure if anxiety related, has requested frequent BP checks by staff.  Some reports today of dizziness. 10.  PAF.  Eliquis on hold due to ICH.  Continue Lopressor.  Cardiac rate controlled.last 2 EKG were normal NSR, pt would like her cardiologist Dr Katherine Mantle at [facility] to be notified of admit  11.  Obesity.  BMI 29.21.  Dietary follow-up 12.  History of recurrent UTI.  Prophylactic Macrodantin 50 mg daily.resume once pt done with Bactrim DS will do 7d tx >100K Kleb P 7/1 Pt was requested to be put on antibiotic for "tingling sensation" with urination, WBC's normal, afebrile, per Dr. Berline Chough, MD, not indicated at this time.  13.  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 Berline Chough, MD's recommendation, increased dose of Flomax to 0.8 mg at supper.  Need I/O cath this morning, with continue to bladder scan and monitor. 14.   Lethargic with poor sitting balance yesterday , repeat CT head unchanged  15.  Constipation , problem at times at home, drank mineral oil and coffee to help, type 5 BM on 6/24 -6/26 last documented 6/24, on senokot and miralax BID, consider additional med if no BM today 7/1 LBM 10/12/21 16.  Hot flashes, many years post menopausal, avoid estrogens, gabapentin may help but could require a higher dose   Neuro and Pharmacy discussed use of estrogen for hot flashes some benfit from premarin vaginal cream, discussed potential benefit of gabapentin but would need higher dose, increase to 300mg  TID, if helpful may d/c premarin  17.  Episode of CPx 1 resolved, exam has tenderness RIght parasternal, EKG and troponin negative, likely MSK  18.  Episode of lightheadedness and dizziness 7/1 Happened with urinary retention, also said had some haziness/cloudy vision, but this was likely due to her not having her glasses on.  No visual issues once glasses were being worn.  LOS: 12 days A FACE TO FACE EVALUATION WAS PERFORMED  9/1 10/13/2021, 2:26 PM

## 2021-10-14 NOTE — Consult Note (Signed)
Neuropsychological Consultation   Patient:   Charlotte Hunter   DOB:   08-15-1955  MR Number:  [id number]  Location:  [facility] [address] CENTER [id number] [place] [address] Dept: [phone] Loc: [phone]           Date of Service:   10/12/2021  Start Time:   9 AM End Time:   10 AM  Provider/Observer:  Arley Phenix, Psy.D.       Clinical Neuropsychologist       Billing Code/Service: [phone]  Chief Complaint:    Charlotte Hunter is a 66 year old female with past history of hypertension, recurrent UTIs, aortic insufficiency, PAF with history of ablation maintained on Eliquis.  Patient presented on 09/25/2021 with acute onset of left-sided weakness as well as blurred vision.  CT showed hemorrhage centered in the right thalamus and basal ganglia with surrounding edema and minimal midline shift, a small amount of intraventricular extension of the third and fourth ventricle.  Patient ultimately assessed and admitted into the comprehensive rehab program.  Reason for Service:  Patient was referred for neuropsychological consultation due to coping and adjustment issues.  Below is the HPI for the current admission.  HPI: Charlotte Hunter is a 66 year old right-handed female with history of hypertension, recurrent UTIs maintained on prophylactic Macrodantin, aortic insufficiency, PAF with history of ablation maintained on Eliquis.  Her latest cardiology notes that she was planning Xarelto to see if this may be more affordable for her however it was never started and she maintained Eliquis.  Per chart review patient lives alone.  1 level home 4 steps to entry.  Independent prior to admission.  Daughter is planning to stay with her on discharge to provide assistance as needed.  Presented 09/25/2021 with acute onset of left-sided weakness as well as blurred vision.  CT of the head showed hemorrhage centered in the right  thalamus and basal ganglia with mild surrounding edema, minimal midline shift, and a small amount of intraventricular extension into the third and fourth ventricle.  Patient was reversed with Andexxa.  Follow-up MRI/MRA unchanged size and morphology of acute intraparenchymal hemorrhage.  No hydrocephalus or trapping.  There was noted focal severe distal left P2 stenosis on MRA.  Admission chemistries unremarkable except potassium 3.2, urinalysis negative.  Echocardiogram with ejection fraction of 65 to 70% no wall motion abnormalities.  She did require short-term intubation through 09/27/2021.  Currently maintained on a mechanical soft nectar thick liquid diet and advanced to thin liquids 10/01/2021.  Therapy evaluations completed due to patient decreased functional mobility left-sided weakness was admitted for a comprehensive rehab program.  Current Status:  Patient was awake and alert and aware of the general nature of her cerebrovascular accident.  Patient's daughter was also present.  Patient has had a lot of coping issues due to feeling like her current status will remain stable going forward and worries about limitations.  Patient will be having her daughter assist in care following discharge.  Behavioral Observation: Charlotte Hunter  presents as a 66 y.o.-year-old Right handed Caucasian Female who appeared her stated age. her dress was Appropriate and she was Well Groomed and her manners were Appropriate to the situation.  her participation was indicative of Appropriate behaviors.  There were physical disabilities noted.  she displayed an appropriate level of cooperation and motivation.     Interactions:    Active Appropriate  Attention:   abnormal and attention span appeared shorter than expected  for age  Memory:   abnormal; remote memory intact, recent memory impaired  Visuo-spatial:  not examined  Speech (Volume):  normal  Speech:   normal; normal  Thought Process:  Coherent and  Relevant  Though Content:  WNL; not suicidal and not homicidal  Orientation:   person, place, and time/date  Judgment:   Fair  Planning:   Poor  Affect:    Anxious  Mood:    Dysphoric  Insight:   Fair  Intelligence:   normal  Medical History:   Past Medical History:  Diagnosis Date   A-fib ([name])    Aortic insufficiency    Hypertension          Patient Active Problem List   Diagnosis Date Noted   ICH (intracerebral hemorrhage) ([name]) 10/01/2021   Paroxysmal atrial fibrillation ([name]) 09/29/2021   Stenosis of intracranial vessel 09/29/2021   Essential hypertension 09/29/2021   Blurry vision 09/29/2021   GERD (gastroesophageal reflux disease) 09/29/2021   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 No pertinent family history.  Impression/DX:  Charlotte Hunter is a 66 year old female with past history of hypertension, recurrent UTIs, aortic insufficiency, PAF with history of ablation maintained on Eliquis.  Patient presented on 09/25/2021 with acute onset of left-sided weakness as well as blurred vision.  CT showed hemorrhage centered in the right thalamus and basal ganglia with surrounding edema and minimal midline shift, a small amount of intraventricular extension of the third and fourth ventricle.  Patient ultimately assessed and admitted into the comprehensive rehab program.  Disposition/Plan:  Today we worked on coping and adjustment issues and answered numerous questions posed both by the patient and her daughter regarding the patient's recovery process and expectations post discharge.  Diagnosis:    Nontraumatic subcortical hemorrhage of left cerebral hemisphere [facility]) - Plan: Ambulatory referral to Neurology, Ambulatory referral to Physical Medicine Rehab         Electronically Signed   _______________________ Arley Phenix,  Psy.D. Clinical Neuropsychologist

## 2021-10-14 NOTE — Plan of Care (Signed)
  Problem: Consults Goal: RH STROKE PATIENT EDUCATION Description: See Patient Education module for education specifics  Outcome: Progressing   Problem: RH BOWEL ELIMINATION Goal: RH STG MANAGE BOWEL WITH ASSISTANCE Description: STG Manage Bowel with min Assistance. Outcome: Progressing Goal: RH STG MANAGE BOWEL W/MEDICATION W/ASSISTANCE Description: STG Manage Bowel with Medication with mod I Assistance. Outcome: Progressing   Problem: RH BLADDER ELIMINATION Goal: RH STG MANAGE BLADDER WITH ASSISTANCE Description: STG Manage Bladder With min Assistance Outcome: Progressing Goal: RH STG MANAGE BLADDER WITH MEDICATION WITH ASSISTANCE Description: STG Manage Bladder With Medication With mod I Assistance. Outcome: Progressing   Problem: RH SAFETY Goal: RH STG ADHERE TO SAFETY PRECAUTIONS W/ASSISTANCE/DEVICE Description: STG Adhere to Safety Precautions With cues Assistance/Device. Outcome: Progressing   Problem: RH KNOWLEDGE DEFICIT Goal: RH STG INCREASE KNOWLEDGE OF DIABETES Description: Patient and dtr will be able to manage prediabetes with dietary modifications using handouts and educational resources independently Outcome: Progressing Goal: RH STG INCREASE KNOWLEDGE OF HYPERTENSION Description: Patient and dtr will be able to manage HTN with medications and dietary modifications using handouts and educational resources independently Outcome: Progressing Goal: RH STG INCREASE KNOWLEGDE OF HYPERLIPIDEMIA Description: Patient and dtr will be able to manage HLD with medications and dietary modifications using handouts and educational resources independently Outcome: Progressing Goal: RH STG INCREASE KNOWLEDGE OF STROKE PROPHYLAXIS Description: Patient and dtr will be able to manage secondary risks with medications and dietary modifications using handouts and educational resources independently Outcome: Progressing   Problem: RH Vision Goal: RH LTG Vision (Specify) Outcome:  Progressing

## 2021-10-14 NOTE — Progress Notes (Signed)
   10/14/21 1600  Clinical Encounter Type  Visited With Patient and family together  Visit Type Spiritual support;Initial  Referral From Nurse Felecia Jan, RN)  Consult/Referral To Chaplain Melvenia Beam)  Spiritual Encounters  Spiritual Needs Emotional;Prayer   Spiritual Care Consultation request for prayer. Met with Ms. Charlotte Hunter and her daughter, Charlotte Hunter, at patient's bedside. Patient discussed her medical condition and believes she has favorable prognosis. [name] states that her mood has been up and down, but mostly very positive attitude. Patient and daughter are strong in their faith and believes that God will bring healing to her body. She is grateful for the support she gets from her daughter. Chaplain provided compassionate meaningful presence and accommodated request for prayer consistent with their faith tradition.  [place], M.Min. [phone].

## 2021-10-14 NOTE — Progress Notes (Signed)
Occupational Therapy Session Note  Patient Details  Name: Charlotte Hunter MRN: [id number] Date of Birth: [birth year]  Today's Date: 10/14/2021 OT Individual Time: 0100-0220 OT Individual Time Calculation (min): 80 min    Short Term Goals: Week 1:  OT Short Term Goal 1 (Week 1): Pt will maintain static sitting balance EOB with no more than min A for >5 min. OT Short Term Goal 1 - Progress (Week 1): Met OT Short Term Goal 2 (Week 1): Pt will complete >2 grooming tasks seated at sink with S. OT Short Term Goal 2 - Progress (Week 1): Progressing toward goal OT Short Term Goal 3 (Week 1): Pt will don shirt in supported sitting mod A. OT Short Term Goal 3 - Progress (Week 1): Progressing toward goal OT Short Term Goal 4 (Week 1): Pt will complete toilet transfer and LRAD with max A. OT Short Term Goal 4 - Progress (Week 1): Met Week 2:  OT Short Term Goal 1 (Week 2): pt will engage in one grooming tasks from w/c at sink with MOD A OT Short Term Goal 2 (Week 2): pt will completed 1/3 toileting tasks with MAXA OT Short Term Goal 3 (Week 2): pt will complete sit>stand with MOD A  Skilled Therapeutic Interventions/Progress Updates:    The pt was in bed upon arrival with a request to bathe at sink LOF.  The pt transferred from supine in bed to EOB with ModA,  she was able to come from EOB to standing with MinA for a stand pivot transfer to the w/c using the RW.  The pt was able to remove her over head shirt while seated  in the w/c at the sink with initial vc's and demonstration for doffing clothing items using the hemi technique, the pt was able to demonstrate effective carryover with MinA.  The pt was able to bathe her UB inclusive of her face, chest and arms with initial cues and MinA secondary to challenges with bilateral hand coordination..  The  pt was encouraged to incorporate the hemi technique to improve independence and safety.  The pt was ModA for LB bathing secondary to the same precipitate.   The pt was able to donn her over head top with verbal instruction for incorporating the hemi technique, she was MinA for clothing management. The pt was MaxA for donning LB garments inclusive of her underwear and pants for threading the item over her feet, pulling them up onto her LB and over her bottom.  The pt was able to brush her teeth and comb her hair with s/u  at sink LOF. The pt was ModA coming from sit to stand  using the grab bars for a stand pivot transfer back into bed secondary to fatigue and deconditioning .  The pt was able to transition from EOB to supine with ModAx1, she was able to demonstrate bed mobility at [facility] for clothing management and proper positioning.  At the end of the session, the bed side table and call light were within reach. The pt had family present throughout the treatment session, with no report of pain, however, she did  indicated that she had a scraped her left knee during placement of her bedside table at lunch, nursing was made aware.  All additional needs for the pt were address prior to exiting the room.   Therapy Documentation Precautions:  Precautions Precautions: Fall, Other (comment) Precaution Comments: L hemiplegia, L inattention, R hip and knee pain Restrictions Weight Bearing Restrictions: No  Therapy/Group: Individual Therapy  Yvonne Kendall 10/14/2021, 5:03 PM

## 2021-10-14 NOTE — Progress Notes (Addendum)
PROGRESS NOTE   Subjective/Complaints:  Notified by RN that pt rather have I/O cath and not use BSC.  Had intermittent dizziness yesterday which has improved but pt says she is afraid of getting dizzy so she doesn't want to use BSC.  Education provided by nurse and NT that she should use BSC and later successfully voided in [facility].  Pt also expressing that she must get medications precisely on time or "my blood pressure will skyrocket".  ROS: Negative CP, SOB, N/V/C/D, says some tingling with urination, but no dysuria, fever, or chills. + Intermittent anxiety, lightheadedness, +urinary retention   Objective:   No results found. Recent Labs    10/13/21 1113  WBC 6.6  HGB 11.7*  HCT 35.4*  PLT 366   No results for input(s): "NA", "K", "CL", "CO2", "GLUCOSE", "BUN", "CREATININE", "CALCIUM" in the last 72 hours.   Intake/Output Summary (Last 24 hours) at 10/14/2021 1207 Last data filed at 10/14/2021 1153 Gross per 24 hour  Intake 598 ml  Output 600 ml  Net -2 ml        Physical Exam: Vital Signs Blood pressure 135/63, pulse 74, temperature 98.5 F (36.9 C), temperature source Oral, resp. rate 16, height 5' 5.98" (1.676 m), weight 83.7 kg, SpO2 93 %.   General: Alert and oriented x 3, No apparent distress.  HEENT: Head is normocephalic, atraumatic, PERRLA, EOMI, sclera anicteric, oral mucosa pink and moist, dentition intact, ext ear canals clear,  Neck: Supple without JVD or lymphadenopathy Heart: Reg rate and rhythm. No murmurs rubs or gallops Chest: CTA bilaterally without wheezes, rales, or rhonchi; no distress Abdomen: Soft, non-tender, non-distended, bowel sounds positive. GU: Urinary retention requiring bladder scans, I/O caths.   Extremities: No clubbing, cyanosis, or edema. Pulses are 2+ Psych: Mood/affect flat.  Anxious. Pt's affect is appropriate. Pt is cooperative Skin: Clean and intact without signs of  breakdown Neuro: Cranial nerves II through XII intact, motor strength is 5/5 in right and 0/5 left deltoid, bicep, tricep, grip, hip flexor or, knee extensors, ankle dorsiflexor and plantar flexor.  Trace left hip ext.  Sensory exam normal to light touch and proprioception in right and partial sensation to pinch left upper and lower extremities.  Negative SLR, pain with Achilles stretching on left. Some dizziness yesterday, mostly resolved, but is fearful of becoming dizzy. Musculoskeletal: FROM all 4 extremities. No joint swelling, tender right parasternal   Assessment/Plan: 1. Functional deficits which require 3+ hours per day of interdisciplinary therapy in a comprehensive inpatient rehab setting. Physiatrist is providing close team supervision and 24 hour management of active medical problems listed below. Physiatrist and rehab team continue to assess barriers to discharge/monitor patient progress toward functional and medical goals  Care Tool:  Bathing    Body parts bathed by patient: Left arm, Chest, Abdomen, Front perineal area, Face, Right upper leg, Left upper leg   Body parts bathed by helper: Right arm, Buttocks, Right lower leg, Left lower leg     Bathing assist Assist Level: Maximal Assistance - Patient 24 - 49%     Upper Body Dressing/Undressing Upper body dressing   What is the patient wearing?: Pull over shirt  Upper body assist Assist Level: Maximal Assistance - Patient 25 - 49%    Lower Body Dressing/Undressing Lower body dressing      What is the patient wearing?: Underwear/pull up, Pants     Lower body assist Assist for lower body dressing: Maximal Assistance - Patient 25 - 49% (bed level)     Toileting Toileting Toileting Activity did not occur (Clothing management and hygiene only): N/A (no void or bm)  Toileting assist Assist for toileting: Maximal Assistance - Patient 25 - 49%     Transfers Chair/bed transfer  Transfers assist  Chair/bed  transfer activity did not occur: Safety/medical concerns  Chair/bed transfer assist level: Moderate Assistance - Patient 50 - 74% (squat pivot)     Locomotion Ambulation   Ambulation assist   Ambulation activity did not occur: Safety/medical concerns  Assist level: 2 helpers Assistive device: Lite Gait Max distance: 38ft   Walk 10 feet activity   Assist  Walk 10 feet activity did not occur: Safety/medical concerns        Walk 50 feet activity   Assist Walk 50 feet with 2 turns activity did not occur: Safety/medical concerns         Walk 150 feet activity   Assist Walk 150 feet activity did not occur: Safety/medical concerns         Walk 10 feet on uneven surface  activity   Assist Walk 10 feet on uneven surfaces activity did not occur: Safety/medical concerns         Wheelchair     Assist Is the patient using a wheelchair?: Yes Type of Wheelchair: Manual (TIS) Wheelchair activity did not occur: Safety/medical concerns         Wheelchair 50 feet with 2 turns activity    Assist    Wheelchair 50 feet with 2 turns activity did not occur: Safety/medical concerns       Wheelchair 150 feet activity     Assist  Wheelchair 150 feet activity did not occur: Safety/medical concerns       Blood pressure 135/63, pulse 74, temperature 98.5 F (36.9 C), temperature source Oral, resp. rate 16, height 5' 5.98" (1.676 m), weight 83.7 kg, SpO2 93 %.  Medical Problem List and Plan: 1. 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  Antithrombotics: -DVT/anticoagulation:  Mechanical: Antiembolism  stockings, thigh (TED hose) Bilateral lower extremities             -antiplatelet therapy: N/A 3. Pain Management: Tylenol as needed, has burning discomfort on Left side , discomfort LLE likely due to Thalamic stroke, neg SLR , pain in L heel likely related to achilles stretch with hypersensitivity  Increase Gabapentin for neurogenic pain 300mg  QID  Fibromyalgia add low dose duloxetine 7/1 Continue 300 mg gabapentin 300 mg QID, duloxetine 20 mg 7/2 Pain controlled today 4. Mood/Sleep: Provide emotional support, pt frustrated with dependant role will stop buspirone and monitor              -antipsychotic agents: N/A 5. Neuropsych/cognition: This patient is capable  of making decisions on her own behalf. 6. Skin/Wound Care: Routine skin checks 7. Fluids/Electrolytes/Nutrition: Routine in and outs with follow-up chemistries 8.  Dysphagia.  Dysphagia #3 thin liquids 9.  Hypertension.  Cozaar 50 mg daily, Lopressor 25 mg twice daily.  Monitor with increased mobility Vitals:   10/14/21 0343 10/14/21 0953  BP: 113/64 135/63  Pulse: 72 74  Resp: 14 16  Temp: 97.6 F (36.4 C) 98.5 F (36.9 C)  SpO2: 93% 93%   Well controlled overall, continue to follow 7/1 Some spikes in BP unsure if anxiety related, has requested frequent BP checks by staff.  Some reports today of dizziness. 10.  PAF.  Eliquis on hold due to ICH.  Continue Lopressor.  Cardiac rate controlled.last 2 EKG were normal NSR, pt would like her cardiologist Dr Katherine Mantle at [facility] to be notified of admit  11.  Obesity.  BMI 29.21.  Dietary follow-up 12.  History of recurrent UTI.  Prophylactic Macrodantin 50 mg daily.resume once pt done with Bactrim DS will do 7d tx >100K Kleb P 7/1 Pt was requested to be put on antibiotic for "tingling sensation" with urination, WBC's normal, afebrile, per Dr. Berline Chough, MD, not indicated at this time.  13.  Incont of bladder no issues prior to CVA, also with retention, add flomax ,  cleaning out bowels would help as well 7/1 Pt has urinary retention today, per Dr. Berline Chough, MD's recommendation, increased dose of Flomax to 0.8 mg at supper.  Need I/O cath this morning, with continue to bladder scan and monitor. 7/2 Still issues with frequent bladder scans and encouragement to use BSC.  Today requesting I/O cath over use of BSC due to fear of becoming dizzy while getting up.  Education provided by nurse, then pt used BSC successfully. 14.  Lethargic with poor sitting balance yesterday , repeat CT head unchanged  15.  Constipation , problem at times at home, drank mineral oil and coffee to help, type 5 BM on 6/24 -6/26 last documented 6/24, on senokot and miralax BID, consider additional med if no BM today 7/2 LBM 10/12/21, continue prn's as needed. 16.  Hot flashes, many years post menopausal, avoid estrogens, gabapentin may help but could require a higher dose   Neuro and Pharmacy discussed use of estrogen for hot flashes some benfit from premarin vaginal cream, discussed potential benefit of gabapentin but would need higher dose, increase to 300mg  TID, if helpful may d/c premarin  17.  Episode of CPx 1 resolved, exam has tenderness RIght parasternal, EKG and troponin negative, likely MSK  18.  Episode of lightheadedness and dizziness 7/1 Happened with urinary retention, also said had some haziness/cloudy vision, but this was likely due to her not having her glasses on.  No visual issues once glasses were being worn. 7/2 No issues today with vision or dizziness, but says she is afraid of becoming dizzy.  Encouraged slow changes in position with mobility.  LOS: 13 days A FACE TO FACE EVALUATION WAS PERFORMED  9/1 10/14/2021, 12:07 PM

## 2021-10-15 MED ORDER — TAMSULOSIN HCL 0.4 MG PO CAPS
0.4000 mg | ORAL_CAPSULE | Freq: Every day | ORAL | Status: DC
  Administered 2021-10-15 – 2021-11-06 (×23): 0.4 mg via ORAL
  Filled 2021-10-15 (×23): qty 1

## 2021-10-15 NOTE — Progress Notes (Signed)
PROGRESS NOTE   Subjective/Complaints:  No new issues , getting some sensation in face   ROS: Negative CP, SOB, N/V/C/D,  Objective:   No results found. Recent Labs    10/13/21 1113  WBC 6.6  HGB 11.7*  HCT 35.4*  PLT 366    No results for input(s): "NA", "K", "CL", "CO2", "GLUCOSE", "BUN", "CREATININE", "CALCIUM" in the last 72 hours.   Intake/Output Summary (Last 24 hours) at 10/15/2021 0946 Last data filed at 10/15/2021 0721 Gross per 24 hour  Intake 682 ml  Output 600 ml  Net 82 ml         Physical Exam: Vital Signs Blood pressure 128/63, pulse 79, temperature 97.7 F (36.5 C), resp. rate 15, height 5' 5.98" (1.676 m), weight 83.7 kg, SpO2 93 %.     General: No acute distress Mood and affect are appropriate Heart: Regular rate and rhythm no rubs murmurs or extra sounds Lungs: Clear to auscultation, breathing unlabored, no rales or wheezes Abdomen: Positive bowel sounds, soft nontender to palpation, nondistended Extremities: No clubbing, cyanosis, or edema   Pt's affect is appropriate. Pt is cooperative Skin: Clean and intact without signs of breakdown Neuro: Cranial nerves II through XII intact, motor strength is 5/5 in right and 0/5 left deltoid, bicep, tricep, grip, hip flexor or, knee extensors, ankle dorsiflexor and plantar flexor.  Trace left hip ext.  Sensory exam normal to light touch and proprioception in right and partial sensation to pinch left upper and lower extremities.  Negative SLR, pain with Achilles stretching on left. Some dizziness yesterday, mostly resolved, but is fearful of becoming dizzy. Musculoskeletal: FROM all 4 extremities. No joint swelling, tender right parasternal   Assessment/Plan: 1. Functional deficits which require 3+ hours per day of interdisciplinary therapy in a comprehensive inpatient rehab setting. Physiatrist is providing close team supervision and 24 hour  management of active medical problems listed below. Physiatrist and rehab team continue to assess barriers to discharge/monitor patient progress toward functional and medical goals  Care Tool:  Bathing    Body parts bathed by patient: Left arm, Chest, Abdomen, Front perineal area, Face, Right upper leg, Left upper leg   Body parts bathed by helper: Right arm, Buttocks, Right lower leg, Left lower leg     Bathing assist Assist Level: Maximal Assistance - Patient 24 - 49%     Upper Body Dressing/Undressing Upper body dressing   What is the patient wearing?: Pull over shirt    Upper body assist Assist Level: Maximal Assistance - Patient 25 - 49%    Lower Body Dressing/Undressing Lower body dressing      What is the patient wearing?: Underwear/pull up, Pants     Lower body assist Assist for lower body dressing: Maximal Assistance - Patient 25 - 49% (bed level)     Toileting Toileting Toileting Activity did not occur (Clothing management and hygiene only): N/A (no void or bm)  Toileting assist Assist for toileting: Maximal Assistance - Patient 25 - 49%     Transfers Chair/bed transfer  Transfers assist  Chair/bed transfer activity did not occur: Safety/medical concerns  Chair/bed transfer assist level: Moderate Assistance - Patient 50 - 74% (  squat pivot)     Locomotion Ambulation   Ambulation assist   Ambulation activity did not occur: Safety/medical concerns  Assist level: 2 helpers Assistive device: Lite Gait Max distance: 41ft   Walk 10 feet activity   Assist  Walk 10 feet activity did not occur: Safety/medical concerns        Walk 50 feet activity   Assist Walk 50 feet with 2 turns activity did not occur: Safety/medical concerns         Walk 150 feet activity   Assist Walk 150 feet activity did not occur: Safety/medical concerns         Walk 10 feet on uneven surface  activity   Assist Walk 10 feet on uneven surfaces activity did  not occur: Safety/medical concerns         Wheelchair     Assist Is the patient using a wheelchair?: Yes Type of Wheelchair: Manual (TIS) Wheelchair activity did not occur: Safety/medical concerns         Wheelchair 50 feet with 2 turns activity    Assist    Wheelchair 50 feet with 2 turns activity did not occur: Safety/medical concerns       Wheelchair 150 feet activity     Assist  Wheelchair 150 feet activity did not occur: Safety/medical concerns       Blood pressure 128/63, pulse 79, temperature 97.7 F (36.5 C), resp. rate 15, height 5' 5.98" (1.676 m), weight 83.7 kg, SpO2 93 %.  Medical Problem List and Plan: 1. 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  Antithrombotics: -DVT/anticoagulation:  Mechanical: Antiembolism stockings, thigh (TED hose) Bilateral lower extremities             -antiplatelet therapy: N/A 3. Pain Management: Tylenol as needed, has burning discomfort on Left side , discomfort LLE likely due to Thalamic stroke, neg SLR , pain in L heel likely related to achilles stretch with hypersensitivity  Increase Gabapentin for neurogenic pain 300mg  QID  Fibromyalgia add low dose duloxetine 7/1 Continue 300 mg gabapentin 300 mg QID, duloxetine 20 mg 7/2 Pain controlled today 4. Mood/Sleep: Provide emotional support, pt frustrated with dependant role will stop buspirone and monitor              -antipsychotic agents: N/A 5. Neuropsych/cognition: This patient is capable of making decisions on her own behalf. 6. Skin/Wound Care: Routine skin checks 7. Fluids/Electrolytes/Nutrition: Routine in and outs  with follow-up chemistries 8.  Dysphagia.  Dysphagia #3 thin liquids 9.  Hypertension.  Cozaar 50 mg daily, Lopressor 25 mg twice daily.  Monitor with increased mobility Vitals:   10/14/21 1955 10/15/21 0542  BP: 137/75 128/63  Pulse: 85 79  Resp: 15 15  Temp: 97.6 F (36.4 C) 97.7 F (36.5 C)  SpO2: 93% 93%   Well controlled overall, continue to follow 7/1 Some spikes in BP unsure if anxiety related, has requested frequent BP checks by staff.  Some reports today of dizziness. 10.  PAF.  Eliquis on hold due to ICH.  Continue  Lopressor.  Cardiac rate controlled.last 2 EKG were normal NSR, pt would like her cardiologist Dr Chryl Heck at [facility] to be notified of admit  11.  Obesity.  BMI 29.21.  Dietary follow-up 12.  History of recurrent UTI.  Prophylactic Macrodantin 50 mg daily.resume once pt done with Bactrim DS will do 7d tx >100K Kleb P 7/1 Pt was requested to be put on antibiotic for "tingling sensation" with urination, WBC's normal, afebrile, per Dr. Dagoberto Ligas, MD, not indicated at this time.  25.  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 Dagoberto Ligas, MD's recommendation, increased dose of Flomax to 0.8 mg at supper.  Need I/O cath this morning, with continue to bladder scan and monitor. 7/2 Still issues with frequent bladder scans and encouragement to use BSC.  Today requesting I/O cath over use of BSC due to fear of becoming dizzy while getting up.  Education provided by nurse, then pt used BSC successfully. 14.  Lethargic with poor sitting balance yesterday , repeat CT head unchanged  15.  Constipation , problem at times at home, drank mineral oil and coffee to help, type 5 BM on 6/24 -6/26 last documented 6/24, on senokot and miralax BID, consider additional med if no BM today 7/2 LBM 10/12/21, continue prn's as needed. 16.  Hot flashes, many years post  menopausal, avoid estrogens, gabapentin may help but could require a higher dose   Improved on premarin  17.  Episode of CPx 1 resolved, exam has tenderness RIght parasternal, EKG and troponin negative, likely MSK  18.  Episode of lightheadedness and dizziness No recurrence check ortho vitals discussed with PT   LOS: 14 days A FACE TO FACE EVALUATION WAS PERFORMED  Charlett Blake 10/15/2021, 9:46 AM

## 2021-10-15 NOTE — Progress Notes (Signed)
Occupational Therapy Session Note  Patient Details  Name: Charlotte Hunter MRN: [id number] Date of Birth: [birth year]  Today's Date: 10/15/2021 OT Individual Time: 1300-1330 & 1530-1630 OT Individual Time Calculation (min): 30 min & 60 min   Short Term Goals: Week 2:  OT Short Term Goal 1 (Week 2): pt will engage in one grooming tasks from w/c at sink with MOD A OT Short Term Goal 2 (Week 2): pt will completed 1/3 toileting tasks with MAXA OT Short Term Goal 3 (Week 2): pt will complete sit>stand with MOD A  Skilled Therapeutic Interventions/Progress Updates:    Session 1: Upon OT arrival, pt semi recumbent in bed. Reports 5/10 pain to lower back and requests pain medication. RN notified. Pt agreeable to OT treatment session. PROM provided to L UE for 2x10 reps including shoulder flex/ext, shoulder abd/add, elbow flex/ext, supination/pronation, wrist ext/flex, finger ext/flex, and thumb abd/add. Pt reports relief after PROM was completed and indicated she feels tingling to her fingers after PROM. Pt took her medication with setup assist using applesauce without spillage. Therapist educated pt on self PROM exercises and pt demonstrates understanding of exercises with teach back provided. Pt was repositioned at end of session and left with all needs in reach and safety measures in place.    Session 2: Upon OT arrival, pt semi recumbent in bed. Reports 4/10 pain to lower back. Pt agreeable to OT treatment session. Pt completes supine to sit transfer to L side of bed with Mod A , sits EOB with Min A to donn R sock with Min A for balance, and squat pivot transfer from bed to w/c with Mod A. Pt was transported to main therapy gym via w/c with Total A for time management. NMES completed to L shoulder for deltoid activation for 12 minutes at 25 MHz to promote functional return of the L UE Good deltoid activation noted and pt tolerates well. While completing NMES, pt uses the R UE to flip over quirkle game  pieces to the colored side and sort based on shape. Pt requires verbal cues to sit upright and not lean against back support of w/c and pt able to complete with SBA. Pt requires short rest breaks to perform task and initially requires encouragement to not support herself with back support from w/c but was agreeable. Pt requires increased time to complete. Pt in good spirits and with excitement about muscle response to NMES and is hopeful for return of function. Pt was returned to her room via w/c and Total A and completes squat pivot transfer on R side of bed with Mod A. Pt completes sit to supine transfer with Mod A and was positioned adequately with pillows and towel roll for L UE. Pt left in bed with all safety measures in place and all needs met.   Therapy Documentation Precautions:  Precautions Precautions: Fall, Other (comment) Precaution Comments: L hemiplegia, L inattention, R hip and knee pain Restrictions Weight Bearing Restrictions: No    Therapy/Group: Individual Therapy  Michaelene Dutan 10/15/2021, 2:06 PM

## 2021-10-15 NOTE — Progress Notes (Signed)
Occupational Therapy Session Note  Patient Details  Name: Charlotte Hunter MRN: [id number] Date of Birth: 01-Sep-1955  Today's Date: 10/15/2021 OT Individual Time: 0830-0930 OT Individual Time Calculation (min): 60 min    Short Term Goals: Week 2:  OT Short Term Goal 1 (Week 2): pt will engage in one grooming tasks from w/c at sink with MOD A OT Short Term Goal 2 (Week 2): pt will completed 1/3 toileting tasks with MAXA OT Short Term Goal 3 (Week 2): pt will complete sit>stand with MOD A  Skilled Therapeutic Interventions/Progress Updates:    Pt received in bed very eager to shower.  Obtained a tub bench to use in shower and a stedy lift.  Had pt work on bed mobility by using R hand to support L arm. Able to roll fully onto R  side with mod A then moved to sit by pushing up with R arm with cues to keep gaze towards ground and then able to sit up with min -mod A. Once up she did well maintaining her sit balance with close S.  Used stedy to stand with cues to think about shifting forward. Pt able to rise to stand in stedy with only CGA!  She was able to maintain postural alignment without cues.  Pt transferred onto shower bench.  See ADL documentation below.  Cues for adaptive strategies with positioning to reach certain body parts, cues for hemiplegic dressing techniques.  Pt followed directions extremely well.   She stated that she would not be able to tolerate sitting in wc for 30 in for next session so pt assisted back to bed with stedy.  She was consistent standing in stedy with CGA.  Pt often doubts herself and needs extra encouragement.   Pt resting in bed positioned with pillows and all needs met. Alarm set.    Therapy Documentation Precautions:  Precautions Precautions: Fall, Other (comment) Precaution Comments: L hemiplegia, L inattention, R hip and knee pain Restrictions Weight Bearing Restrictions: No  Pain: Pain Assessment Pain Score: 4  - (headache) RN provided  medication ADL: ADL Eating: Set up, Supervision/safety Where Assessed-Eating: Bed level Grooming: Maximal assistance Where Assessed-Grooming: Edge of bed Upper Body Bathing: Moderate assistance Where Assessed-Upper Body Bathing: Shower Lower Body Bathing: Moderate assistance Where Assessed-Lower Body Bathing: Shower Upper Body Dressing: Moderate assistance Where Assessed-Upper Body Dressing: Edge of bed Lower Body Dressing: Maximal assistance Where Assessed-Lower Body Dressing: Edge of bed Toileting: Maximal assistance Where Assessed-Toileting: Bed level Toilet Transfer: Not assessed Tub/Shower Transfer: Not assessed Social research officer, government: Other (comment) Walk-In Shower Transfer Method: Other (comment) (Stedy lift) Youth worker: Radio broadcast assistant  Therapy/Group: Individual Therapy  [place] 10/15/2021, 12:11 PM

## 2021-10-15 NOTE — Progress Notes (Signed)
Physical Therapy Session Note  Patient Details  Name: Charlotte Hunter MRN: [id number] Date of Birth: 10-26-1955  Today's Date: 10/15/2021 PT Individual Time: 1008-1053 PT Individual Time Calculation (min): 45 min   Short Term Goals: Week 2:  PT Short Term Goal 1 (Week 2): Pt will perform supine<>sit with mod assist of 1 consistently PT Short Term Goal 2 (Week 2): Pt will perform sit<>stands using LRAD with mod assist of 1 PT Short Term Goal 3 (Week 2): Pt will perform bed<>chair transfers  using LRAD with mod assist of 1 PT Short Term Goal 4 (Week 2): Pt will ambulate at least 69ft using LRAD with +2 mod assist  Skilled Therapeutic Interventions/Progress Updates:    Pt reported right low back pain 5-6/10 and pain addressed through increased mobility and activity. Pt received semi-reclined in bed with RN present. Provider present during portion of session and requested orthostatic vitals. Pt BP taken on R UE and was 130/67 (87) in supine. In sitting BP initially was 129/84 (95) and was 126/64 (83) approximately five minutes later. Standing BP recorded as 110/66 (78). Pt symptomatic and reported lightheadedness. PT instructed pt on UE and LE mobility to increase circulation and address lightheadedness and symptoms dissipated. Pt required min A for rolling to the right side and mod A for supine to sit transfer. PT provided assistance for L UE and LE management with bed mobility. Pt requires close supervision for static and dynamic sitting balance. Pt required total A for donning of socks and AFO. Pt requires mod to max A for sit<>stand transfer with support for L UE and blocking of L LE. Pt performed stand step transfer to wheelchair from bed and required max A with verbal cueing for foot placement and weight shifting. Pt repositioned in wheelchair and performed additional sit<>stand transfer. Pt able to stand approximately 2 minutes and required verbal, tactile and visual cueing for weight shifting in  bilateral directions. Pt left seated in recliner with nurse call bell in reach and chair alarm on.   Therapy Documentation Precautions:  Precautions Precautions: Fall, Other (comment) Precaution Comments: L hemiplegia, L inattention, R hip and knee pain Restrictions Weight Bearing Restrictions: No        Therapy/Group: Individual Therapy  Truitt Leep PT, DPT Philip Aspen, PT, DPT, CBIS  10/15/2021, 11:41 AM

## 2021-10-16 NOTE — Progress Notes (Signed)
Physical Therapy Weekly Progress Note  Patient Details  Name: Charlotte Hunter MRN: [id number] Date of Birth: 1955-11-25  Beginning of progress report period: October 09, 2021 End of progress report period: October 16, 2021  Today's Date: 10/16/2021 PT Individual Time: 1305-1400 and 1530-1620 PT Individual Time Calculation (min): 55 min and 50 min  Patient has met 4 of 4 short term goals. Ms. Strader is making steady progress with therapy demonstrating increasing independence with functional mobility. She continues to have significant L hemiplegia with impaired sensation and hypotonia all contributing to impaired sitting and standing balance. She is performing supine<>sit with mod assist, sit<>stands with mod assist, and squat pivot transfers with mod assist. She is participating in gait training targeting L LE NMR up to 28ft with +2 R HHA and max assist from therapist for balance and L LE gait mechanics using GRAFO - she has progressed to advancing L LE ~50% of the way during swing although continues to require total assist to block L knee buckle during stance due to inadequate glute and quad activation. She will benefit from continued CIR level skilled physical therapy to further increase her independence with functional mobility prior to D/C.   Patient continues to demonstrate the following deficits muscle weakness, muscle joint tightness, and muscle paralysis, decreased cardiorespiratoy endurance, impaired timing and sequencing, abnormal tone, unbalanced muscle activation, and decreased motor planning, decreased attention to left, and decreased sitting balance, decreased standing balance, decreased postural control, hemiplegia, and decreased balance strategies and therefore will continue to benefit from skilled PT intervention to increase functional independence with mobility.  Patient progressing toward long term goals..  Continue plan of care.  PT Short Term Goals Week 2:  PT Short Term Goal 1 (Week  2): Pt will perform supine<>sit with mod assist of 1 consistently PT Short Term Goal 1 - Progress (Week 2): Met PT Short Term Goal 2 (Week 2): Pt will perform sit<>stands using LRAD with mod assist of 1 PT Short Term Goal 2 - Progress (Week 2): Met PT Short Term Goal 3 (Week 2): Pt will perform bed<>chair transfers  using LRAD with mod assist of 1 PT Short Term Goal 3 - Progress (Week 2): Met PT Short Term Goal 4 (Week 2): Pt will ambulate at least 38ft using LRAD with +2 mod assist PT Short Term Goal 4 - Progress (Week 2): Met Week 3:  PT Short Term Goal 1 (Week 3): Pt will perform supine<>sit with min assist of 1 using bedrail PT Short Term Goal 2 (Week 3): Pt will perform sit<>stand transfers with min assist of 1 PT Short Term Goal 3 (Week 3): Pt will perform bed<>chair transfers with min assist of 1 PT Short Term Goal 4 (Week 3): Pt wil ambulate at least 59ft using LRAD with mod assist of 1 and +2 for safety PT Short Term Goal 5 (Week 3): Pt will perform at least 4ft of wheelchair mobility with min assist  Skilled Therapeutic Interventions/Progress Updates:  Ambulation/gait training;Community reintegration;DME/adaptive equipment instruction;Neuromuscular re-education;Psychosocial support;Stair training;UE/LE Strength taining/ROM;Wheelchair propulsion/positioning;Balance/vestibular training;Discharge planning;Functional electrical stimulation;Pain management;Skin care/wound management;UE/LE Coordination activities;Therapeutic Activities;Cognitive remediation/compensation;Disease management/prevention;Functional mobility training;Patient/family education;Splinting/orthotics;Therapeutic Exercise;Visual/perceptual remediation/compensation   Session 1: Pt received supported upright in bed finishing lunch and agreeable to therapy session. Supine>sitting R EOB, HOB slightly elevated but using bedrail, with min assist primarily for L hemibody management allowing pt increased time for independence with  cuing for sequencing. Sitting EOB with supervision donned tennis shoes and L LE Matrix Max GRAFO total assist.  L squat pivot transfer EOB>w/c with light mod assist for lifting/pivoting hips - continued cuing for increased anterior trunk lean - therapist blocking L knee and faciltiating improved alignment to promote WBing for NMR.   Transported to/from gym in w/c for time management and energy conservation.  Sit>stand from w/c to R HHA from +2 assist with mod assist of 1 for lifting to stand and balance - continues to have R weight shift bias limting L LE WBing.  Donned leg lifter to utilize during swing phase facilitation.  Gait training 71ft x3 - requiring +2 R HHA with L UE support over therapist's shoulders and max assist of 1 for balance and L LE management -  - verbal cuing for swing phase advancement with pt able to advance it ~50% every time when cued and given increased time; however, does have scissoring with some likely adductor tone requiring therapist to reposition LE prior to initiating [place] - continues to require total assist to block L knee to prevent buckling during stance with pt having difficulty activating quads and glutes and often when she does activate them, it will cause her to draw her leg back underneath her BOS because she is not WBing through it yet when she fires her glutes resulting in hip extension - has L lateral lean during R stance phase requiring cuing for weight shifting onto stance limb  - requires facilitation for L weight shift onto L stance limb to promote WBing  Vitals after 2nd walk: BP 113/60 (MAP 76), HR 91bpm due to pt reporting feeling tired and concerned about her BP with pt stating she has a lot of stress right now - therapist provided emotional support and uplifting environment during session for stress management. Also, educated pt on importance of upright, OOB positioning during the day as opposed to returning to bed after every therapy session -  pt in agreement to trying recliner today.  Transported back to her room and pt agreeable to remain sitting up in recliner. L stand pivot transfer w/c>recliner with max assist of 1 for lifting, balance, blocking L knee during stance and facilitating L lateral step.  Pt left seated in recliner with needs in reach, seat belt alarm on, and L UE supported on pillows.  Session 2: Pt received supine in bed with her daughter, Tera, present and pt agreeable to therapy session. Supine>sitting R EOB, HOB slightly elevated and using bedrail, with heavy min assist only for L hemibody management - cuing for pt to move L LE without assistance as she demonstrates some activation in hip muscles and ability to compensate by shifting her pelvis. Sitting EOB, supervision for balance, donned tennis shoes and L LE Matrix Max GRAFO total assist. L squat pivot transfer EOB>w/c with mod assist for lifting/pivoting hips with total assist for L LE positioning and therapist blocking knee to facilitate WBing - continued cuing for increased anterior trunk lean/weight shift.  Transported to/from gym in w/c for time management and energy conservation.  Sit>stands from w/c to L UE support around therapist's shoulders and to +2 assist's R HHA once in standing with mod assist of 1 for lifting to stand and balance while promoting L LE WBing and alignment due to excessive hip adduction - pt continues to compensate with R weight shift.  Gait training 55ft + 65ft using R HHA from +2 assist and L UE support around therapist's shoulders with max assist of 1 for balance and L LE gait mechanics - provided pt with music during  2nd walk because pt reports she enjoyed dancing prior to hospitalization and she demonstrates improved L stance control with the music compared to without via increased glute/quad activation - continues to demo same gait deviations and require same facilitation/cuing as above  Pt requesting to return to bed at end of  session. Transported back to room.  R stand pivot transfer w/c>EOB with R UE support on bedrail and mod/max assist of 1 for lifting and balance while turning with therapist having to block L knee buckle during stance for pt to step laterally with R LE.  Sit>supine with mod assist for L hemibody management onto bed. Pt left supine in bed with needs in reach, her daughter present, and NT arriving to assist pt with changing her clothes.     Therapy Documentation Precautions:  Precautions Precautions: Fall, Other (comment) Precaution Comments: L hemiplegia, L inattention, R hip and knee pain Restrictions Weight Bearing Restrictions: No   Pain: Session 1: Pt reports some R hip pain, unrated, that is chronic and benefits from seated rest breaks during session for pain management.  Session 2: Pt reports some R hip and knee pain that is chronic, but premedicated and responds well to distraction/redirection and seated rest breaks when fatigued.   Therapy/Group: Individual Therapy  Tawana Scale , PT, DPT, NCS, CSRS 10/16/2021, 12:18 PM

## 2021-10-16 NOTE — Progress Notes (Signed)
Speech Language Pathology Discharge Summary  Patient Details  Name: Charlotte Hunter MRN: [id number] Date of Birth: 03-Jun-1955  Today's Date: 10/16/2021 SLP Individual Time: 7048-8891 SLP Individual Time Calculation (min): 28 min  Skilled Therapeutic Interventions: Skilled ST treatment focused on cognitive and swallowing goals. Pt consumed single and sequential sips of thin liquid by straw without overt s/sx of aspiration and with clear vocal quality post swallows. Pt continues to tolerate a regular consistency diet with set-up A. Swallow goals met. SLP facilitated session by providing supervision A question cues for emergent and anticipatory awareness during functional discussion re: functional home safety scenarios, discussion on current physical limitations, anticipation of needs at discharge including level of assist, equipment, compensations/adaptations. Pt responded with appropriate insight into current deficits and appropriate anticipation of needs during various ADLs/iADLs for overall safety and effectiveness. Pt demonstrates improved awareness in structured contexts and will likely still benefit from close supervision at discharge due to suspected over estimation of abilities during functional mobility tasks and to appropriately anticipate needs. SLP plans to discharge from [facility] services due to all goals met. No additional speech needs identified at this time. Pt in agreement. Patient was left in bed with alarm activated and immediate needs within reach at end of session.  Patient has met 3 of 3 long term goals.  Patient to discharge at overall Modified Independent;Supervision level.  Reasons goals not met: All goals met   Clinical Impression/Discharge Summary: Patient has made functional gains and has met 3 of 3 long-term goals this admission. Pt is currently tolerating a regular texture diet with mod I for implementation of swallowing precautions. Pt is completing cognitive tasks at the  supervision level in regards to attention and awareness. Pt demonstrates improved and appropriate awareness in structured contexts and will likely still benefit from close supervision at discharge due to suspected over estimation of abilities during functional mobility tasks and to appropriately anticipate needs from a physical standpoint. Pt/daughter feel pt is at baseline from a cognitive perspective. Patient's care partner is independent to provide the necessary physical and cognitive assistance at discharge. 24 hour supervision is recommended. Follow up SLP services do not appear clinically indicated at this time. Ongoing PT/OT is recommended.  Care Partner:  Caregiver Able to Provide Assistance: Yes  Type of Caregiver Assistance: Physical;Cognitive  Recommendation:  None      Equipment: none   Reasons for discharge: Treatment goals met   Patient/Family Agrees with Progress Made and Goals Achieved: Yes    Quita Mcgrory T Shunta Mclaurin 10/16/2021, 4:16 PM

## 2021-10-16 NOTE — Progress Notes (Incomplete)
Inpatient Rehabilitation Discharge Medication Review by a Pharmacist  A complete drug regimen review was completed for this patient to identify any potential clinically significant medication issues.  High Risk Drug Classes Is patient taking? Indication by Medication  Antipsychotic No   Anticoagulant No   Antibiotic Yes Macrodantin- UTI prophylaxis  Opioid No   Antiplatelet No   Hypoglycemics/insulin No   Vasoactive Medication Yes Cozaar, Lopressor- hypertension  Chemotherapy No   Other Yes Protonix- GERD Crestor- HLD Premarin- hot flashes Cymbalta- fibromyalgia Lidoderm patch-  Flomax- urinary retention Zanaflex- spasticity  Melatonin- sleep     Type of Medication Issue Identified Description of Issue Recommendation(s)  Drug Interaction(s) (clinically significant)     Duplicate Therapy     Allergy     No Medication Administration End Date     Incorrect Dose     Additional Drug Therapy Needed     Significant med changes from prior encounter (inform family/care partners about these prior to discharge).    Other  PTA meds: Xarelto - stopped Toprol XL Patient changed to Lopressor . Consider changing back to XL on discharge.    Clinically significant medication issues were identified that warrant physician communication and completion of prescribed/recommended actions by midnight of the next day:  No   Time spent performing this drug regimen review (minutes):  30 min

## 2021-10-16 NOTE — Plan of Care (Signed)
  Problem: Consults Goal: RH STROKE PATIENT EDUCATION Description: See Patient Education module for education specifics  Outcome: Progressing   Problem: RH BOWEL ELIMINATION Goal: RH STG MANAGE BOWEL WITH ASSISTANCE Description: STG Manage Bowel with min Assistance. Outcome: Progressing Goal: RH STG MANAGE BOWEL W/MEDICATION W/ASSISTANCE Description: STG Manage Bowel with Medication with mod I Assistance. Outcome: Progressing   Problem: RH BLADDER ELIMINATION Goal: RH STG MANAGE BLADDER WITH ASSISTANCE Description: STG Manage Bladder With min Assistance Outcome: Not Progressing Goal: RH STG MANAGE BLADDER WITH MEDICATION WITH ASSISTANCE Description: STG Manage Bladder With Medication With mod I Assistance. Outcome: Not Progressing Note: Encourage the patient to toilet but states do not feel like getting up.   Problem: RH SAFETY Goal: RH STG ADHERE TO SAFETY PRECAUTIONS W/ASSISTANCE/DEVICE Description: STG Adhere to Safety Precautions With cues Assistance/Device. Outcome: Progressing   Problem: RH KNOWLEDGE DEFICIT Goal: RH STG INCREASE KNOWLEDGE OF DIABETES Description: Patient and dtr will be able to manage prediabetes with dietary modifications using handouts and educational resources independently Outcome: Progressing Goal: RH STG INCREASE KNOWLEDGE OF HYPERTENSION Description: Patient and dtr will be able to manage HTN with medications and dietary modifications using handouts and educational resources independently Outcome: Progressing Goal: RH STG INCREASE KNOWLEGDE OF HYPERLIPIDEMIA Description: Patient and dtr will be able to manage HLD with medications and dietary modifications using handouts and educational resources independently Outcome: Progressing Goal: RH STG INCREASE KNOWLEDGE OF STROKE PROPHYLAXIS Description: Patient and dtr will be able to manage secondary risks with medications and dietary modifications using handouts and educational resources  independently Outcome: Progressing   Problem: RH Vision Goal: RH LTG Vision (Specify) Outcome: Progressing

## 2021-10-16 NOTE — Progress Notes (Signed)
PROGRESS NOTE   Subjective/Complaints:  No pain c/os states she gets dizzy when up  PT recorded borderline low orthostatics , sup to stand  ROS: Negative CP, SOB, N/V/C/D,  Objective:   No results found. Recent Labs    10/13/21 1113  WBC 6.6  HGB 11.7*  HCT 35.4*  PLT 366    No results for input(s): "NA", "K", "CL", "CO2", "GLUCOSE", "BUN", "CREATININE", "CALCIUM" in the last 72 hours.   Intake/Output Summary (Last 24 hours) at 10/16/2021 0908 Last data filed at 10/15/2021 1829 Gross per 24 hour  Intake 240 ml  Output 781 ml  Net -541 ml         Physical Exam: Vital Signs Blood pressure (!) 133/59, pulse 71, temperature 97.8 F (36.6 C), resp. rate 15, height 5' 5.98" (1.676 m), weight 80.6 kg, SpO2 92 %.     General: No acute distress Mood and affect are appropriate Heart: Regular rate and rhythm no rubs murmurs or extra sounds Lungs: Clear to auscultation, breathing unlabored, no rales or wheezes Abdomen: Positive bowel sounds, soft nontender to palpation, nondistended Extremities: No clubbing, cyanosis, or edema   Pt's affect is appropriate. Pt is cooperative Skin: Clean and intact without signs of breakdown Neuro: Cranial nerves II through XII intact, motor strength is 5/5 in right and 0/5 left deltoid, bicep, tricep, grip, hip flexor or, knee extensors, ankle dorsiflexor and plantar flexor.  Trace left hip ext.  Sensory exam normal to light touch and proprioception in right and No LT sensation left upper and lower extremities.  Negative SLR, pain with Achilles stretching on left.  Musculoskeletal: FROM all 4 extremities. No joint swelling, tender right parasternal   Assessment/Plan: 1. Functional deficits which require 3+ hours per day of interdisciplinary therapy in a comprehensive inpatient rehab setting. Physiatrist is providing close team supervision and 24 hour management of active  medical problems listed below. Physiatrist and rehab team continue to assess barriers to discharge/monitor patient progress toward functional and medical goals  Care Tool:  Bathing    Body parts bathed by patient: Left arm, Chest, Abdomen, Front perineal area, Face, Right upper leg, Left upper leg   Body parts bathed by helper: Right arm, Buttocks, Right lower leg, Left lower leg     Bathing assist Assist Level: Moderate Assistance - Patient 50 - 74%     Upper Body Dressing/Undressing Upper body dressing   What is the patient wearing?: Pull over shirt    Upper body assist Assist Level: Moderate Assistance - Patient 50 - 74%    Lower Body Dressing/Undressing Lower body dressing      What is the patient wearing?: Underwear/pull up, Pants     Lower body assist Assist for lower body dressing: Maximal Assistance - Patient 25 - 49%     Toileting Toileting Toileting Activity did not occur (Clothing management and hygiene only): N/A (no void or bm)  Toileting assist Assist for toileting: Maximal Assistance - Patient 25 - 49%     Transfers Chair/bed transfer  Transfers assist  Chair/bed transfer activity did not occur: Safety/medical concerns  Chair/bed transfer assist level: Maximal Assistance - Patient 25 - 49%  Locomotion Ambulation   Ambulation assist   Ambulation activity did not occur: Safety/medical concerns  Assist level: 2 helpers Assistive device: Lite Gait Max distance: 59ft   Walk 10 feet activity   Assist  Walk 10 feet activity did not occur: Safety/medical concerns        Walk 50 feet activity   Assist Walk 50 feet with 2 turns activity did not occur: Safety/medical concerns         Walk 150 feet activity   Assist Walk 150 feet activity did not occur: Safety/medical concerns         Walk 10 feet on uneven surface  activity   Assist Walk 10 feet on uneven surfaces activity did not occur: Safety/medical concerns          Wheelchair     Assist Is the patient using a wheelchair?: Yes Type of Wheelchair: Manual (TIS) Wheelchair activity did not occur: Safety/medical concerns         Wheelchair 50 feet with 2 turns activity    Assist    Wheelchair 50 feet with 2 turns activity did not occur: Safety/medical concerns       Wheelchair 150 feet activity     Assist  Wheelchair 150 feet activity did not occur: Safety/medical concerns       Blood pressure (!) 133/59, pulse 71, temperature 97.8 F (36.6 C), resp. rate 15, height 5' 5.98" (1.676 m), weight 80.6 kg, SpO2 92 %.  Medical Problem List and Plan: 1. 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  Antithrombotics: -DVT/anticoagulation:  Mechanical: Antiembolism stockings, thigh (TED hose) Bilateral lower extremities             -antiplatelet therapy: N/A 3. Pain Management: Tylenol as needed, has burning discomfort on Left side , discomfort LLE likely due to Thalamic stroke, neg SLR , pain in L heel likely related to achilles stretch with hypersensitivity  Increase Gabapentin for neurogenic pain 300mg  QID  Fibromyalgia add low dose duloxetine 7/1 Continue 300 mg gabapentin 300 mg QID, duloxetine 20 mg 7/4 Pain controlled today 4. Mood/Sleep: Provide emotional support, pt frustrated with dependant role will stop buspirone and monitor              -antipsychotic agents: N/A 5. Neuropsych/cognition: This patient is capable of making decisions on her own behalf. 6. Skin/Wound Care: Routine skin checks 7. Fluids/Electrolytes/Nutrition: Routine in and outs with follow-up  chemistries 8.  Dysphagia.  Dysphagia #3 thin liquids 9.  Hypertension.  Cozaar 50 mg daily, Lopressor 25 mg twice daily.  Monitor with increased mobility Vitals:   10/16/21 0616 10/16/21 0759  BP: 133/71 (!) 133/59  Pulse: 80 71  Resp: 15   Temp: 97.8 F (36.6 C)   SpO2: 92%    Controlled some borderline orthostatic changes related to FLomax 0.8 mg have reduced to 0.4mg   10.  PAF.  Eliquis on hold due to ICH.  Continue Lopressor.  Cardiac rate controlled.last 2 EKG were normal NSR, pt would like her cardiologist Dr  Komada at [facility] to be notified of admit  11.  Obesity.  BMI 29.21.  Dietary follow-up 12.  History of recurrent UTI.  Prophylactic Macrodantin 50 mg daily.resume once pt done with Bactrim DS will do 7d tx >100K Kleb P 7/1 Pt was requested to be put on antibiotic for "tingling sensation" with urination, WBC's normal, afebrile, per Dr. Dagoberto Ligas, MD, not indicated at this time.  60.  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 Dagoberto Ligas, MD's recommendation, increased dose of Flomax to 0.8 mg at supper.  But may be contriobuting to orthostasis  7/2 Still issues with frequent bladder scans and encouragement to use BSC.  Today requesting I/O cath over use of BSC due to fear of becoming dizzy while getting up.  Education provided by nurse, then pt used BSC successfully. 14.  Lethargic with poor sitting balance yesterday , repeat CT head unchanged  15.  Constipation , problem at times at home, drank mineral oil and coffee to help, type 5 BM on 6/24 LBM 7/3 16.  Hot flashes, many years post menopausal, , gabapentin may help but could require a higher dose   Improved on premarin  17.  Episode of CPx 1 resolved, exam has tenderness RIght parasternal, EKG and troponin negative, likely MSK    LOS: 15 days A FACE TO FACE EVALUATION WAS PERFORMED  Charlett Blake 10/16/2021, 9:08 AM

## 2021-10-16 NOTE — Plan of Care (Signed)
  Problem: RH Swallowing Goal: LTG Patient will consume least restrictive diet using compensatory strategies with assistance (SLP) Description: LTG:  Patient will consume least restrictive diet using compensatory strategies with assistance (SLP) Outcome: Completed/Met   Problem: RH Swallowing Goal: LTG Patient will participate in dysphagia therapy to increase swallow function with assistance (SLP) Description: LTG:  Patient will participate in dysphagia therapy to increase swallow function with assistance (SLP) Outcome: Completed/Met   Problem: RH Awareness Goal: LTG: Patient will demonstrate awareness during functional activites type of (SLP) Description: LTG: Patient will demonstrate awareness during functional activites type of (SLP) Outcome: Completed/Met

## 2021-10-16 NOTE — Progress Notes (Signed)
Occupational Therapy Session Note  Patient Details  Name: Charlotte Hunter MRN: [id number] Date of Birth: 11/30/1955  Today's Date: 10/16/2021 OT Individual Time: 1005-1105 OT Individual Time Calculation (min): 60 min    Short Term Goals: Week 2:  OT Short Term Goal 1 (Week 2): pt will engage in one grooming tasks from w/c at sink with MOD A OT Short Term Goal 2 (Week 2): pt will completed 1/3 toileting tasks with MAXA OT Short Term Goal 3 (Week 2): pt will complete sit>stand with MOD A  Skilled Therapeutic Interventions/Progress Updates:  Pt seated on BSC with CNA present upon OT arrival to the room. Pt reports, "I am little tired this morning." Pt in agreement for OT session. Pt participates in an ADL session to improve independence and recall of hemi-techniques with ADLs. See details below.   Therapy Documentation Precautions:  Precautions Precautions: Fall, Other (comment) Precaution Comments: L hemiplegia, L inattention, R hip and knee pain Restrictions Weight Bearing Restrictions: No Vital Signs (most recent vitals charted from nursing staff): Therapy Vitals Temp: 98 F (36.7 C) Pulse Rate: 97 Resp: 17 BP: 119/65 Patient Position (if appropriate): Sitting Oxygen Therapy SpO2: 95 % O2 Device: Room Air Pain: Pain Scale: 0-10 Pain Rating: No pain  ADL: ADL Eating: Set up, Supervision/safety Where Assessed-Eating: Bed level Grooming: Setup (Pt able to wash face and complete oral care with set-up assist to open/apply/close toothpaste while seated in w/c at the sink.) Upper Body Dressing: Moderate assistance (Pt able to recall hemi-techniques learned from previous sessions. pt participates in all portions of task. However, requires assistance to fully thread LUE and make clothing adjustments around the trunk. Pt able to maintian sititng balance with no LOB.) Where Assessed-Upper Body Dressing: Edge of bed Lower Body Dressing: Moderate assistance (Pt able to participate in  all portions of task. However, requires moderate assist to don shorts at bed level. LB dressing performed at bed level due to pt reporting decreased comfort with leaning outside of BOS to thread BLE while sitting at EOB.) Where Assessed-Lower Body Dressing: Bed level Toileting: Moderate assistance (Pt is able to participate in toileting hygiene and managing clothing. However, requires moderate assistance to fully complete all portions of task due to decreased standing balance. Pt requires moderate assist to ensure standing balance.) Where Assessed-Toileting: Bedside Commode Toilet Transfer: Moderate assistance (Pt able to complete SPT to L side with maximal assistance due to transferring to the impacted side. However, pt demo's moderate assistance when performing SPT to the R side.) Toilet Transfer Method: Stand pivot Toilet Transfer Equipment: Bedside commode Bed <> Chair Transfer: Moderate assistance (Pt able to complete SPT's to R side with moderate assistance from EOB <> w/c. Pt able to verbalize when there is a potential problem and need to sit and readjust which indicates good safety awareness. Pt continues to progress with SPT's today.) ADL Comments: Assisted pt with picking out clothing today during OT session. Pt able to recall hemi-dressing techniques learned in previous sessions (donning L side first) which indicates comprehension of learned tasks during OT sessions. Pt demonstrates progress today with being able to complete grooming with set-up assist while seated and LB dressing with moderate assistance at bed level. Pt reports improved comfort with performing LB dressing at bed level due to impaired sitting balance with increased fatigue.   Pt returned to bed at end of session. Pt left resting comfortably in bed with personal belongings and call light within reach, bed alarm on and activated, be din low  position, 3 bed rails up, and comfort needs attended to.    Therapy/Group: Individual  Therapy  Lavera Guise 10/16/2021, 4:33 PM

## 2021-10-17 NOTE — Progress Notes (Signed)
PROGRESS NOTE   Subjective/Complaints:  Ambulated with assist x 2 in therapy yesterday   ROS: Negative CP, SOB, N/V/C/D,  Objective:   No results found. No results for input(s): "WBC", "HGB", "HCT", "PLT" in the last 72 hours.  No results for input(s): "NA", "K", "CL", "CO2", "GLUCOSE", "BUN", "CREATININE", "CALCIUM" in the last 72 hours.   Intake/Output Summary (Last 24 hours) at 10/17/2021 0932 Last data filed at 10/17/2021 0630 Gross per 24 hour  Intake 590 ml  Output --  Net 590 ml         Physical Exam: Vital Signs Blood pressure (!) 122/58, pulse 61, temperature 98 F (36.7 C), resp. rate 15, height 5' 5.98" (1.676 m), weight 80.6 kg, SpO2 94 %.     General: No acute distress Mood and affect are appropriate Heart: Regular rate and rhythm no rubs murmurs or extra sounds Lungs: Clear to auscultation, breathing unlabored, no rales or wheezes Abdomen: Positive bowel sounds, soft nontender to palpation, nondistended Extremities: No clubbing, cyanosis, or edema   Pt's affect is appropriate. Pt is cooperative Skin: Clean and intact without signs of breakdown Neuro: Cranial nerves II through XII intact, motor strength is 5/5 in right and 0/5 left deltoid, bicep, tricep, grip, hip flexor or, knee extensors, ankle dorsiflexor and plantar flexor.  Trace left hip ext.  Sensory exam normal to light touch and proprioception in right and No LT sensation left upper and lower extremities.  Negative SLR, pain with Achilles stretching on left.  Musculoskeletal: FROM all 4 extremities. No joint swelling, tender right parasternal   Assessment/Plan: 1. Functional deficits which require 3+ hours per day of interdisciplinary therapy in a comprehensive inpatient rehab setting. Physiatrist is providing close team supervision and 24 hour management of active medical problems listed below. Physiatrist and rehab team continue to  assess barriers to discharge/monitor patient progress toward functional and medical goals  Care Tool:  Bathing    Body parts bathed by patient: Left arm, Chest, Abdomen, Front perineal area, Face, Right upper leg, Left upper leg   Body parts bathed by helper: Right arm, Buttocks, Right lower leg, Left lower leg     Bathing assist Assist Level: Moderate Assistance - Patient 50 - 74%     Upper Body Dressing/Undressing Upper body dressing   What is the patient wearing?: Pull over shirt    Upper body assist Assist Level: Moderate Assistance - Patient 50 - 74%    Lower Body Dressing/Undressing Lower body dressing      What is the patient wearing?: Pants     Lower body assist Assist for lower body dressing: Moderate Assistance - Patient 50 - 74%     Toileting Toileting Toileting Activity did not occur Press photographer and hygiene only): N/A (no void or bm)  Toileting assist Assist for toileting: Maximal Assistance - Patient 25 - 49%     Transfers Chair/bed transfer  Transfers assist  Chair/bed transfer activity did not occur: Safety/medical concerns  Chair/bed transfer assist level: Maximal Assistance - Patient 25 - 49%     Locomotion Ambulation   Ambulation assist   Ambulation activity did not occur: Safety/medical concerns  Assist level: 2 helpers  Assistive device: Lite Gait Max distance: 89ft   Walk 10 feet activity   Assist  Walk 10 feet activity did not occur: Safety/medical concerns        Walk 50 feet activity   Assist Walk 50 feet with 2 turns activity did not occur: Safety/medical concerns         Walk 150 feet activity   Assist Walk 150 feet activity did not occur: Safety/medical concerns         Walk 10 feet on uneven surface  activity   Assist Walk 10 feet on uneven surfaces activity did not occur: Safety/medical concerns         Wheelchair     Assist Is the patient using a wheelchair?: Yes Type of  Wheelchair: Manual (TIS) Wheelchair activity did not occur: Safety/medical concerns         Wheelchair 50 feet with 2 turns activity    Assist    Wheelchair 50 feet with 2 turns activity did not occur: Safety/medical concerns       Wheelchair 150 feet activity     Assist  Wheelchair 150 feet activity did not occur: Safety/medical concerns       Blood pressure (!) 122/58, pulse 61, temperature 98 F (36.7 C), resp. rate 15, height 5' 5.98" (1.676 m), weight 80.6 kg, SpO2 94 %.  Medical Problem List and Plan: 1. 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  Antithrombotics: -DVT/anticoagulation:  Mechanical: Antiembolism stockings, thigh (TED hose) Bilateral lower extremities             -antiplatelet therapy: N/A 3. Pain Management: Tylenol as needed, has burning discomfort on Left side , discomfort LLE likely due to Thalamic stroke, neg SLR , pain in L heel likely related to achilles stretch with hypersensitivity  Increase Gabapentin for neurogenic pain 300mg  QID  Fibromyalgia add low dose duloxetine 7/1 Continue 300 mg gabapentin 300 mg QID, duloxetine 20 mg 7/4 Pain controlled today 4. Mood/Sleep: Provide emotional support, pt frustrated with dependant role will stop buspirone and monitor              -antipsychotic agents: N/A 5. Neuropsych/cognition: This patient is capable of making decisions on her own behalf. 6. Skin/Wound Care: Routine skin checks 7. Fluids/Electrolytes/Nutrition: Routine in and outs with follow-up chemistries 8.  Dysphagia.  Dysphagia #3 thin liquids 9.  Hypertension.  Cozaar 50 mg  daily, Lopressor 25 mg twice daily.  Monitor with increased mobility Vitals:   10/16/21 1918 10/17/21 0421  BP: (!) 136/55 (!) 122/58  Pulse: 87 61  Resp: 15 15  Temp: 97.9 F (36.6 C) 98 F (36.7 C)  SpO2: 95% 94%   Controlled some borderline orthostatic changes related to FLomax 0.8 mg have reduced to 0.4mg   10.  PAF.  Eliquis on hold due to ICH.  Continue Lopressor.  Cardiac rate controlled.last 2 EKG were normal NSR, pt would like her cardiologist Dr 12/18/21 at [facility] to be notified of admit  11.  Obesity.  BMI 29.21.  Dietary follow-up 12.  History of recurrent UTI.  Prophylactic Macrodantin 50 mg daily.resume once pt done  7/1 Pt was requested to be put on antibiotic for "tingling sensation" with urination, WBC's normal, afebrile, per Dr. Dagoberto Ligas, MD, not indicated at this time.  46.  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  Lethargic with poor sitting balance yesterday , repeat CT head unchanged  15.  Constipation , problem at times at home, drank mineral oil and coffee to help, type 5 BM on 6/24 LBM 7/4 16.  Hot flashes, many years post menopausal, , gabapentin may help but could require a higher dose   Improved on premarin  17.  Episode of CPx 1 resolved, exam has tenderness RIght parasternal, EKG and troponin negative, likely MSK    LOS: 16 days A FACE TO FACE EVALUATION WAS PERFORMED  Charlett Blake 10/17/2021, 9:32 AM

## 2021-10-17 NOTE — Patient Care Conference (Signed)
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 Alysia Penna Social Worker Present: Erlene Quan, BSW Nurse Present: Dorien Chihuahua, RN PT Present: Page Spiro, PT OT Present: [name], COTA;Jennifer Tyrone, OT SLP Present: Sherren Kerns, SLP PPS Coordinator present : Gunnar Fusi, SLP     Current Status/Progress Goal Weekly Team Focus  Bowel/Bladder   Continent of B/B.  Remain continent.  Assist with toileting as needed   Swallow/Nutrition/ Hydration   tolerating current diet - goals met; discharged from [facility]  for bathing from EOB ( MAX in shower), MODA for dressing, MAX A for 3/3 toileting tasks, MAX A for toilet transfers via squat pivot. continues to be limited by pain  downgraded to mostly MIN A  BADL reeducation, transfer training, balance training, functional mobility, LUE NMR   Mobility   mod assist supine<>sit using bedrail, min assist dynamic sitting balance, mod assist of 1 sit<>stand, light mod assist squat pivot transfers, gait up to 98f using R HHA from +2 assist and max assist from therapist for balance and L LE management, advancing L LE 50% during swing and demonstrating light quad/glute activation during stance though still total assist to prevent knee buckle wearing GRAFO   min assist overall  activity tolerance, pt/family education, bed mobility training, transfer training, L hemibody NMR, dynamic sitting balance, dynamic standing balance, positioning, gait training   Communication             Safety/Cognition/ Behavioral Observations  goals met - discharged from SLP         Pain   C/O pain to right hip and thigh. PRN voltaren gel helpful  Pain <3/10  Assess Qshift and prn   Skin   Skin intact  Maintain skin integrity  Assess Qshift and prn     Discharge Planning:  Patient discharging to SNF   Team Discussion: Patient post thalamic capsular hemorrhage; showing progress with extended stay. Pain controlled and tolerating po diet well. Appears to have a better understanding of her deficits.  Patient on target to meet rehab goals: Currently needs mod assist for bathing at the sink. Mod assist [facility] max assist. Able to complete transfers with mod assist.   *See Care Plan and progress notes for long and short-term goals.   Revisions to Treatment Plan:  Discontinued SLP services; met anticipatory and awareness goals. Stretching before activity with AFO consult recommended for right abductor tone Downgraded OT goals to min assist   Teaching Needs: Safety, transfers, toileting, medications, dietary modifications, etc   Current Barriers to Discharge: Home enviroment access/layout and Lack of/limited family support  Possible Resolutions to Barriers: Family education SNF recommended     Medical Summary Current Status: no improvement in  LUE strength or sensation , dizziness improved with adjustment of meds  Barriers to Discharge: Medical stability   Possible Resolutions to [employer] Focus: I/O cath prn, cont med managment of bladder issues   Continued Need for Acute Rehabilitation Level of Care: The patient requires daily medical management by a physician with specialized training in physical medicine and  rehabilitation for the following reasons: Medical management of patient stability for increased activity during participation in an intensive rehabilitation regime.: Yes Analysis of laboratory values and/or radiology reports with any subsequent need for medication adjustment and/or medical intervention. : Yes   I attest that I was present, lead the team conference, and concur with the assessment and plan of the team.   Dorien Chihuahua B 10/17/2021, 3:57 PM

## 2021-10-17 NOTE — Progress Notes (Signed)
Occupational Therapy Session Note  Patient Details  Name: Charlotte Hunter MRN: [id number] Date of Birth: [birth year]  Today's Date: 10/17/2021 OT Individual Time: 1100-1145 OT Individual Time Calculation (min): 45 min  and Today's Date: 10/17/2021 OT Missed Time: 15 Minutes Missed Time Reason: Patient fatigue;Pain   Short Term Goals: Week 2:  OT Short Term Goal 1 (Week 2): pt will engage in one grooming tasks from w/c at sink with MOD A OT Short Term Goal 2 (Week 2): pt will completed 1/3 toileting tasks with MAXA OT Short Term Goal 3 (Week 2): pt will complete sit>stand with MOD A  Skilled Therapeutic Interventions/Progress Updates:    Pt resting in bed upon arrival. Pt requested her BSC be adjusted. Pt commented that she was "really tired" because nursing always awakened her at 6 am to "pee" and that is "entirely too early." Pt with difficulty keeping eyes open throughout session. LUE NMR as pt tolerated. LUE PROM at shoulder, elbow, and wrist/fingers.  1:1 NMES applied to LUE wrist and finger flexors to facilitate flexion and functional use  Ratio 1:3 Rate 35 pps Waveform- Asymmetric Ramp 1.0 Pulse 300 Intensity- 17 Duration -  15 mins   No adverse reactions after treatment and is skin intact.   1:1 NMES applied to LUE wrist and finger extensors to facilitate extension and functional use   Ratio 1:3 Rate 35 pps Waveform- Asymmetric Ramp 1.0 Pulse 300 Intensity- 14 Duration -  2  Pt unable to tolerate and reported increase "tingling" throughout arm.   No adverse reactions after treatment and is skin intact.   Pt unable to keep eyes open and reported increased pain. Pt missed 15 mins skilled OT services.    Therapy Documentation Precautions:  Precautions Precautions: Fall, Other (comment) Precaution Comments: L hemiplegia, L inattention, R hip and knee pain Restrictions Weight Bearing Restrictions: No General: General OT Amount of Missed Time: 15  Minutes Pain: Pt reported increased "tingling" in LUE with PROM at shoulder and elbow in addition to supination; repositioned   Therapy/Group: Individual Therapy  Rich Brave 10/17/2021, 11:46 AM

## 2021-10-17 NOTE — Progress Notes (Incomplete)
Inpatient Rehabilitation Discharge Medication Review by a Pharmacist  A complete drug regimen review was completed for this patient to identify any potential clinically significant medication issues.  High Risk Drug Classes Is patient taking? Indication by Medication  Antipsychotic No   Anticoagulant No   Antibiotic Yes Macrodantin- UTI prophylaxis  Opioid No   Antiplatelet No   Hypoglycemics/insulin No   Vasoactive Medication Yes Cozaar, Lopressor- hypertension  Chemotherapy No   Other Yes Protonix- GERD Crestor- HLD Premarin- hot flashes Cymbalta- fibromyalgia Lidoderm patch-  Flomax- urinary retention Zanaflex- spasticity  Melatonin- sleep     Type of Medication Issue Identified Description of Issue Recommendation(s)  Drug Interaction(s) (clinically significant)     Duplicate Therapy     Allergy     No Medication Administration End Date     Incorrect Dose     Additional Drug Therapy Needed     Significant med changes from prior encounter (inform family/care partners about these prior to discharge).    Other  PTA meds: Xarelto - stopped Toprol XL Patient changed to Lopressor . Consider changing back to XL on discharge.    Clinically significant medication issues were identified that warrant physician communication and completion of prescribed/recommended actions by midnight of the next day:  No   Time spent performing this drug regimen review (minutes):  30 min   Thank you for allowing pharmacy to be a part of this patient's care.  Thelma Barge, PharmD Clinical Pharmacist

## 2021-10-17 NOTE — Progress Notes (Addendum)
Patient ID: Charlotte Hunter, female   DOB: Feb 19, 1956, 66 y.o.   MRN: [id number]  SW spoke with patient daughter to received additional preferences for SNF. Daughter reports that she was informed that patient could remain on CIR until 7/15. Sw informed patient daughter that this information provided by nursing was incorrect. Sw informed patient daughter that team is anticipating a SNF transfer early next week.   Patient daughter has provided ALF preferences, Sw has informed patient daughter that facilities do not offer SNF. Patient daughter expresses that she spoke with Medicare and does not want to feel as her mother is being rushed out the facility. Patient daughter reports that Medicare shared with her that her mother has 4 weeks of therapy on CIR. Sw will have MD discuss with patient daughter.  Sw shared with patient daughter if she is unable to provide SNF options by early next week we will have to plan for a discharge home later in the week. No additional questions or concerns.   Referral sent to [facility]: [phone]

## 2021-10-17 NOTE — Progress Notes (Signed)
Occupational Therapy Weekly Progress Note  Patient Details  Name: Charlotte Hunter MRN: [id number] Date of Birth: 1955/08/27  Beginning of progress report period: October 09, 2021 End of progress report period: October 17, 2021  Today's Date: 10/17/2021  Patient has met 3 of 3 short term goals. Pt currently requires MOD A for toilet transfers to [facility] via squat pivot, MAX A for 3/3 toileting tasks, MOD A for bathing from sink level, MOD A for UB dressing and MOD A for LB dressing. Pt continues to present with L sided hemiplegia ( no active movement noted at this time in LUE), impaired balance,increased pain, decreased activity tolerance and generalized deconditioning. Plan for DC to SNF however awating bed placement.   Patient continues to demonstrate the following deficits: {impairments:3041632} and therefore will continue to benefit from skilled OT intervention to enhance overall performance with {ADL/iADL:3041649}.  Patient {LTG progression:3041653}.  {plan of NPYY:5110211}  OT Short Term Goals Week 1:  OT Short Term Goal 1 (Week 1): Pt will maintain static sitting balance EOB with no more than min A for >5 min. OT Short Term Goal 1 - Progress (Week 1): Met OT Short Term Goal 2 (Week 1): Pt will complete >2 grooming tasks seated at sink with S. OT Short Term Goal 2 - Progress (Week 1): Progressing toward goal OT Short Term Goal 3 (Week 1): Pt will don shirt in supported sitting mod A. OT Short Term Goal 3 - Progress (Week 1): Progressing toward goal OT Short Term Goal 4 (Week 1): Pt will complete toilet transfer and LRAD with max A. OT Short Term Goal 4 - Progress (Week 1): Met Week 2:  OT Short Term Goal 1 (Week 2): pt will engage in one grooming tasks from w/c at sink with MOD A OT Short Term Goal 1 - Progress (Week 2): Met OT Short Term Goal 2 (Week 2): pt will completed 1/3 toileting tasks with MAXA OT Short Term Goal 2 - Progress (Week 2): Met OT Short Term Goal 3 (Week 2): pt will complete  sit>stand with MOD A OT Short Term Goal 3 - Progress (Week 2): Met Week 3:  OT Short Term Goal 1 (Week 3): STG= LTG   Therapy Documentation Precautions:  Precautions Precautions: Fall, Other (comment) Precaution Comments: L hemiplegia, L inattention, R hip and knee pain Restrictions Weight Bearing Restrictions: No    Therapy/Group: Individual Therapy  Corinne Ports [facility] 10/17/2021, 4:07 PM

## 2021-10-17 NOTE — Progress Notes (Signed)
Patient ID: Charlotte Hunter, female   DOB: December 06, 1955, 66 y.o.   MRN: [id number]  Sw made attempt to call patient daughter to provide conference updates. Sw will wait for follow up.

## 2021-10-17 NOTE — Progress Notes (Signed)
Patient repositioned. Declined lunch at this time. Patient states she will call staff when she is ready to eat.

## 2021-10-17 NOTE — Progress Notes (Signed)
Physical Therapy Session Note  Patient Details  Name: Charlotte Hunter MRN: [id number] Date of Birth: 12/13/1955  Today's Date: 10/17/2021 PT Individual Time: 1400-1507 and 4270-6237 PT Individual Time Calculation (min): 67 min and 58 min  Short Term Goals: Week 3:  PT Short Term Goal 1 (Week 3): Pt will perform supine<>sit with min assist of 1 using bedrail PT Short Term Goal 2 (Week 3): Pt will perform sit<>stand transfers with min assist of 1 PT Short Term Goal 3 (Week 3): Pt will perform bed<>chair transfers with min assist of 1 PT Short Term Goal 4 (Week 3): Pt wil ambulate at least 59ft using LRAD with mod assist of 1 and +2 for safety PT Short Term Goal 5 (Week 3): Pt will perform at least 42ft of wheelchair mobility with min assist  Skilled Therapeutic Interventions/Progress Updates:    Session 1: Pt received supported up in bed and agreeable to therapy session. Supine>sitting R EOB, HOB elevated and using bedrail, with light mod assist only for L UE and LE management over to EOB - cuing for pt to perform as much as possible with L LE and demos slight activation vs compensatory movement at pelvis to achieve L LE movement. Sitting EOB with supervision, no instability noted while using R UE support on bedrail as needed, donning shoes and L LE Matrix Max GRAFO total assist.  L squat pivot transfer EOB>w/c with light mod assist for lifting/pivoting hips and therapist providing total assist for L LE positioning during transfer to promote WBing for NMR.   Transported to/from gym in w/c for time management and energy conservation.  Sit>stands from w/c with L UE support around therapist's shoulders with mod assist of 1 for lifting to stand and balance with therapist facilitating improved L LE alignment to avoid excessive hip adduction and promoting increased L weight shift for LLE WBing and NMR.  Donned leg lifter to facilitate improved swing phase mechanics.  Gait training 19ft + 26ft +  69ft with R HHA from +2 assist and therapist providing mod/max assist for balance and L hemibody management as described below: - started with Matrix Max GRAFO for 1st walk and then swapped it for a more supportive brace of the Sprystep Max GRAFO to provide increased knee extension support to prevent knee buckling, still determining which is most appropriate  - continues to be able to advance L LE ~50% of swing phase consistently with toe drag due to lack of sufficient hamstring activation to achieve foot clearance as well as excessive hip adduction requiring cuing and facilitation to correct - requires max/total assist to block L knee buckle during stance with facilitation and cuing for trunk upright because this improves hip extension activation and decreases knee buckle while facilitating L weight shift - has L trunk lean during R stance phase requiring cuing and manual facilitation to sustain R weight shift longer to allow L LE advancement   Transported back to room. L stand pivot transfer w/c>BSC over toilet with max assist of 1 for balance and L LE management with pt using R UE support on grab bar - total assist to block L knee during stance to prevent buckle and facilitation for L lateral step to complete transfer. Standing with mod assist of 1 while +2 assist provided dependent LB clothing management.  Pt left seated on BSC over toilet in the care of the nurse.   Session 2: Pt received supine in bed asleep, but easily awakens and agreeable to therapy session. Supine>sitting  R EOB, HOB slightly elevated and using bedrail, with light mod assist for L hemibody management - requires increased effort to come up from lower trunk support.  Sitting EOB, supervision for trunk control, donned shoes and L LE Sprystep Max GRAFO total assist for time management. L squat pivot transfer EOB>w/c with continued cuing for increased anterior trunk lean and cuing for increased B LE WBing to lift hips and pivot them  with light mod assist - continues to require total assist to position L LE during transfer to promote WBing for NMR.    Transported to/from gym in w/c for time management and energy conservation.  R squat pivot transfer w/c>EOM with lighter mod assist and continued cuing for increased anterior trunk lean and lifting hips with therapist manually facilitating L LE positioning.  Participated in the following WBing activities focusing on L LE NMR with mirror feedback to promote improved L weight shift and allow pt to see L LE muscle activation: - repeated sit<>stands to/from elevated EOM with mod assist of 1 for lifting/balance and facilitating increased L weight shift and manually blocking/facilitation L hip/knee extension and improving trunk/hip alignment in midline while rising - intermittent R UE support on stable surface used - standing with intermittent R UE support while participating in pre-gait forward/backwards stepping with R LE to promote L LE WBing and hip/knee extensor activation with dual-task of having to reach and move clothespins placed to promote L weight shift - requires mod/max assist for balance during this while continuing to block L knee buckle and facilitate avoiding knee hyperextension   L squat pivot transfer EOM>w/c as described above. Transported back to room and pt agreeable to remain sitting up in w/c for meal - therapist set-up pt's meal and left her with L UE supported on pillow and needs in reach.    Therapy Documentation Precautions:  Precautions Precautions: Fall, Other (comment) Precaution Comments: L hemiplegia, L inattention, R hip and knee pain Restrictions Weight Bearing Restrictions: No   Pain:  Session 1: Reports increasing R hip pain with additional walking likely due to prolonged R stance phase causing hip muscle fatigue - provided frequent seated rest breaks along with distraction/redirection for pain management - premedicated.    Session 2:  Continues to report R hip and knee pain that occurs with prolonged standing as pt relying heavily on those joints to support her in standing at this time due to L LE plegia - provide frequent seated rest breaks for pain management as well as distraction/redirection and emotional support - pt premedicated.    Therapy/Group: Individual Therapy  Ginny Forth , PT, DPT, NCS, CSRS 10/17/2021, 7:59 AM

## 2021-10-17 NOTE — Progress Notes (Signed)
Occupational Therapy Session Note  Patient Details  Name: Charlotte Hunter MRN: [id number] Date of Birth: 06-Jan-1956  Today's Date: 10/17/2021 OT Individual Time: 6759-1638 OT Individual Time Calculation (min): 48 min    Short Term Goals: Week 2:  OT Short Term Goal 1 (Week 2): pt will engage in one grooming tasks from w/c at sink with MOD A OT Short Term Goal 2 (Week 2): pt will completed 1/3 toileting tasks with MAXA OT Short Term Goal 3 (Week 2): pt will complete sit>stand with MOD A  Skilled Therapeutic Interventions/Progress Updates:  Pt greeted supine in bed with RN present taking meds. Pt agreeable to OT intervention. Session focus on BADL reeducation, functional mobility, dynamic standing balance and decreasing overall caregiver burden.    Pt completed supine>sit with MOD A, doing great with compensatory strategies of scooting hips to EOB and hooking RLE underneath LLE. Pt completed squat pivot to DABSC with MOD A to L side. Pt with + BM/urine void. Pt needed MAX A for 3/3 toileting tasks but able to stand with MOD A while RN provided pericare. RN switched out [facility] for w/c in standing.  Pt completed bathing at sink with overall MOD A. Education provided on compensatory methods for hemidressing/bathing. Pt donned [place] shirt with MODA, MOD A for LB dressing via sit>stand. Pt much more participatory and encouraged this session. pt left seated seated in w/c with alarm belt activated and all needs within reach.                      Therapy Documentation Precautions:  Precautions Precautions: Fall, Other (comment) Precaution Comments: L hemiplegia, L inattention, R hip and knee pain Restrictions Weight Bearing Restrictions: No  Pain: Unrated pain reported in back and LLE, rest breaks provided as needed.    Therapy/Group: Individual Therapy  Barron Schmid 10/17/2021, 12:14 PM

## 2021-10-18 DIAGNOSIS — M79605 Pain in left leg: Secondary | ICD-10-CM

## 2021-10-18 DIAGNOSIS — R1310 Dysphagia, unspecified: Secondary | ICD-10-CM

## 2021-10-18 NOTE — Progress Notes (Signed)
Physical Therapy Session Note  Patient Details  Name: Charlotte Hunter MRN: [id number] Date of Birth: 05/26/1955  Today's Date: 10/18/2021 PT Individual Time: 1027-2536 and 1305-1402 PT Individual Time Calculation (min): 69 min and 57 min  Short Term Goals: Week 3:  PT Short Term Goal 1 (Week 3): Pt will perform supine<>sit with min assist of 1 using bedrail PT Short Term Goal 2 (Week 3): Pt will perform sit<>stand transfers with min assist of 1 PT Short Term Goal 3 (Week 3): Pt will perform bed<>chair transfers with min assist of 1 PT Short Term Goal 4 (Week 3): Pt wil ambulate at least 33ft using LRAD with mod assist of 1 and +2 for safety PT Short Term Goal 5 (Week 3): Pt will perform at least 31ft of wheelchair mobility with min assist  Skilled Therapeutic Interventions/Progress Updates:    Session 1: Pt received sitting in w/c and agreeable to therapy session. Donned jacket with max assist for time management. Donned tennis shoes and L LE Sprystep Max GRAFO total assist for time management.  Transported to/from gym in w/c for time management and energy conservation.  Sit>stands from w/c with L UE support over therapist's shoulder with light mod assist for rising to stand and balance - pt continues to have R weight shift bias/compensation with minimal L LE WBing - cuing and manual facilitation for L LE hip/knee extension and L weight shift.   Gait training 76ft x2 + 64ft + 69ft with R HHA from +2 assist and heavy mod assist from therapist for balance and L LE gait mechanics as follows: - pt advances L LE >75% of the way during swing with improved BOS (not as narrow); however, continues to lack L heel strike with her leg often drawing backwards underneath her BOS without therapist holding it in place to promote initiation of WBing - max progressing towards mod assist to block L knee during stance to prevent buckling and pt continues to demo increased glute/quad activation when given cue to  maintain trunk upright - continues to have L lateral trunk lean/LOB during R stance phase because of delayed L swing advancement but improved with verbal cuing Continues to have improvement in all of these gait deviations with additional gait trails.  Transported pt back to room and requesting to return to bed due to fatigue.   R squat/partial stand pivot transfer with mod assist of 1 for lifting and balance while blocking L knee to allow R LE to step laterally. Sit>supine with light mod assist for L hemibody management - cuing for pt to perform more with her LLE with decreasing assist.. Pt left supine in bed in care of nursing staff.    Session 2: Pt received sitting on [facility] with nurse present assisting pt with toileting. Pt agreeable to therapy session therefore therapist assumed care of pt. Sit>stand BSC over toilet>stedy with light min assist of 1 for balance/lifting and to support L UE. Stedy transport to w/c. Donned tennis shoes and  LLE Sprystep Max GRAFO total assist for time. Transported to/from gym in w/c for time management and energy conservation.  Pt participated in July 4th celebration eating ice cream (carb modified) with focus on L attention and LU E NMR with therapist providing hand-over-hand assist to hold bowel with her L hand towards pt's L visual field.  R squat pivot transfer w/c>EOM with heavy min assist for lifting/pivoting hips and balance - manual facilitation for L LE  positioning - continued cuing for increased anterior trunk  lean.  Sit<>stands to/from EOM with light mod assist for balance - therapist facilitating L LE alignment to avoid excessive hip adduction and to promote WBing - mirror feedback 1x for pt to see the active movement she has to perform L hip/knee extension (difficult to sustain contraction).  Dynamic standing balance task focusing on L LE WBing for NMR to increase glute and quad contraction with pt now demonstrating visible movement with contraction -  requires mod/max assist of 1 for balance due to L lateral lean and anterior LOB while guarding L knee buckle - progressed to stepping R LE forward/backwards while tossing horseshoes to external target  L squat pivot EOM>w/c min A! for pivoting hips and positioning L LE - continued cuing for anterior trunk lean.  Transported back to room and pt agreeable to remain sitting up in w/c - left with needs in reach and seat belt alarm on.   Therapy Documentation Precautions:  Precautions Precautions: Fall, Other (comment) Precaution Comments: L hemiplegia, L inattention, R hip and knee pain Restrictions Weight Bearing Restrictions: No   Pain: Session 1: Continues to have R hip pain with prolonged walking/standing due to muscle fatigue - provide frequent seated rest breaks and distraction/redirection with emotional support for pain management - premedicated.  Session 2: Continues to require frequent seated rest breaks due to R hip pain/fatigue with prolonged standing as pt tends to keep weight shifted over onto her RLE to compensate for L LE paresis - premedicated.    Therapy/Group: Individual Therapy  Ginny Forth , PT, DPT, NCS, CSRS 10/18/2021, 8:03 AM

## 2021-10-18 NOTE — Progress Notes (Signed)
Occupational Therapy Session Note  Patient Details  Name: Charlotte Hunter MRN: [id number] Date of Birth: 1955/10/17  Today's Date: 10/18/2021 OT Individual Time: 3976-7341 OT Individual Time Calculation (min): 70 min   Short Term Goals: Week 3:  OT Short Term Goal 1 (Week 3): STG= LTG  Skilled Therapeutic Interventions/Progress Updates:  Pt greeted supine in bed  agreeable to OT intervention. Session focus on BADL reeducation, functional mobility, dynamic standing balance and decreasing overall caregiver burden.         Pt completed supine>sit with MODA, used stedy for shower transfer with pt able to stand to stedy with MIN A. Total A transport to shower, pt completed bathing with overall MIN A needing assist to wash RUE. Pt continues to have no Active movement in LUE during bathing tasks but did initiate hand over hand assist with RUE. Pt also reports she had sensation in L glute last night. Pt exited shower with stedy.  Pt completed dressing from w/c with MOD A for UB dressing, MIN cues need to recall hemi technique. MOD A for LB dressing via sit>stand with pt able to stand x2 to pull pants/brief up to waist line. Pt completed oral care with set- up assist and MOD A to blow dry her hair as pt used comb with R hand and OTA held blow dryer. pt left up in w/c with alarm belt activated and all needs within reach.                     Therapy Documentation Precautions:  Precautions Precautions: Fall, Other (comment) Precaution Comments: L hemiplegia, L inattention, R hip and knee pain Restrictions Weight Bearing Restrictions: No  Pain: Unrated pain reported in BLEs, rest breaks provided as needed.     Therapy/Group: Individual Therapy  Pollyann [facility] 10/18/2021, 12:03 PM

## 2021-10-18 NOTE — Progress Notes (Signed)
PROGRESS NOTE   Subjective/Complaints:  Reports strength in L hip is improving.   ROS: Negative CP, SOB, N/V/C/D, HA  Objective:   No results found. No results for input(s): "WBC", "HGB", "HCT", "PLT" in the last 72 hours.  No results for input(s): "NA", "K", "CL", "CO2", "GLUCOSE", "BUN", "CREATININE", "CALCIUM" in the last 72 hours.   Intake/Output Summary (Last 24 hours) at 10/18/2021 0803 Last data filed at 10/17/2021 2032 Gross per 24 hour  Intake 710 ml  Output 0 ml  Net 710 ml         Physical Exam: Vital Signs Blood pressure 118/68, pulse 72, temperature (!) 97.2 F (36.2 C), resp. rate 16, height 5' 5.98" (1.676 m), weight 80.6 kg, SpO2 94 %.     General: No acute distress Mood and affect are appropriate Heart: Regular rate and rhythm no rubs murmurs or extra sounds Lungs: Clear to auscultation, breathing unlabored, no rales or wheezes, good air movement Abdomen: Positive bowel sounds, soft nontender to palpation, nondistended Extremities: No clubbing, cyanosis, or edema   Pt's affect is appropriate. Pt is cooperative Skin: Clean and intact without signs of breakdown Neuro: Cranial nerves II through XII intact, motor strength is 5/5 in right and 0/5 left deltoid, bicep, tricep, grip, hip flexor or, knee extensors, ankle dorsiflexor and plantar flexor.  Trace left hip ext.  Sensory exam normal to light touch and proprioception in right and No LT sensation left upper and lower extremities.  Negative SLR, pain with Achilles stretching on left.  Musculoskeletal: FROM all 4 extremities. No joint swelling, tender right parasternal   Assessment/Plan: 1. Functional deficits which require 3+ hours per day of interdisciplinary therapy in a comprehensive inpatient rehab setting. Physiatrist is providing close team supervision and 24 hour management of active medical problems listed below. Physiatrist and rehab  team continue to assess barriers to discharge/monitor patient progress toward functional and medical goals  Care Tool:  Bathing    Body parts bathed by patient: Left arm, Chest, Abdomen   Body parts bathed by helper: Right arm     Bathing assist Assist Level: Moderate Assistance - Patient 50 - 74%     Upper Body Dressing/Undressing Upper body dressing   What is the patient wearing?: Pull over shirt    Upper body assist Assist Level: Moderate Assistance - Patient 50 - 74%    Lower Body Dressing/Undressing Lower body dressing      What is the patient wearing?: Pants     Lower body assist Assist for lower body dressing: Moderate Assistance - Patient 50 - 74%     Toileting Toileting Toileting Activity did not occur Press photographer and hygiene only): N/A (no void or bm)  Toileting assist Assist for toileting: Maximal Assistance - Patient 25 - 49%     Transfers Chair/bed transfer  Transfers assist  Chair/bed transfer activity did not occur: Safety/medical concerns  Chair/bed transfer assist level: Moderate Assistance - Patient 50 - 74% (squat pivot)     Locomotion Ambulation   Ambulation assist   Ambulation activity did not occur: Safety/medical concerns  Assist level: 2 helpers (mod/max A from therapist & +2 R HHA) Assistive device: Other (  comment) (R HHA & L UE around therapist's shoulders) Max distance: 15ft   Walk 10 feet activity   Assist  Walk 10 feet activity did not occur: Safety/medical concerns        Walk 50 feet activity   Assist Walk 50 feet with 2 turns activity did not occur: Safety/medical concerns         Walk 150 feet activity   Assist Walk 150 feet activity did not occur: Safety/medical concerns         Walk 10 feet on uneven surface  activity   Assist Walk 10 feet on uneven surfaces activity did not occur: Safety/medical concerns         Wheelchair     Assist Is the patient using a wheelchair?:  Yes Type of Wheelchair: Manual (TIS) Wheelchair activity did not occur: Safety/medical concerns         Wheelchair 50 feet with 2 turns activity    Assist    Wheelchair 50 feet with 2 turns activity did not occur: Safety/medical concerns       Wheelchair 150 feet activity     Assist  Wheelchair 150 feet activity did not occur: Safety/medical concerns       Blood pressure 118/68, pulse 72, temperature (!) 97.2 F (36.2 C), resp. rate 16, height 5' 5.98" (1.676 m), weight 80.6 kg, SpO2 94 %.  Medical Problem List and Plan: 1. 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  Antithrombotics: -DVT/anticoagulation:  Mechanical: Antiembolism stockings, thigh (TED hose) Bilateral lower extremities             -antiplatelet therapy: N/A 3. Pain Management: Tylenol as needed, has burning discomfort on Left side , discomfort LLE likely due to Thalamic stroke, neg SLR , pain in L heel likely related to achilles stretch with hypersensitivity  Increase Gabapentin for neurogenic pain 300mg  QID  Fibromyalgia add low dose duloxetine 7/1 Continue 300 mg gabapentin 300 mg QID, duloxetine 20 mg 7/6 pain continues to be controlled 4. Mood/Sleep: Provide emotional support, pt frustrated with dependant role will stop buspirone and monitor              -antipsychotic agents: N/A 5. Neuropsych/cognition: This patient is capable of making decisions on her own behalf. 6. Skin/Wound Care: Routine skin checks 7. Fluids/Electrolytes/Nutrition: Routine in and outs with follow-up chemistries 8.  Dysphagia.  Dysphagia #3 thin liquids -on regular  diet/thin 9.  Hypertension.  Cozaar 50 mg daily, Lopressor 25 mg twice daily.  Monitor with increased mobility Vitals:   10/17/21 2003 10/18/21 0654  BP: (!) 130/58 118/68  Pulse: 68 72  Resp: 17 16  Temp: 97.9 F (36.6 C) (!) 97.2 F (36.2 C)  SpO2: 96% 94%   Controlled some borderline orthostatic changes related to FLomax 0.8 mg have reduced to 0.4mg   10.  PAF.  Eliquis on hold due to ICH.  Continue Lopressor.  Cardiac rate controlled.last 2 EKG were normal NSR, pt would like her cardiologist Dr 12/19/21 at [facility]  to be notified of admit  11.  Obesity.  BMI 29.21.  Dietary follow-up 12.  History of recurrent UTI.  Prophylactic Macrodantin 50 mg daily.resume once pt done  7/1 Pt was requested to be put on antibiotic for "tingling sensation" with urination, WBC's normal, afebrile, per Dr. Dagoberto Ligas, MD, not indicated at this time.  23.  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  Lethargic with poor sitting balance yesterday , repeat CT head unchanged  15.  Constipation , problem at times at home, drank mineral oil and coffee to help, type 5 BM on 6/24 LBM 7/6 16.  Hot flashes, many years post menopausal, , gabapentin may help but could require a higher dose   Improved on premarin  17.  Episode of CPx 1 resolved, exam has tenderness RIght parasternal, EKG and troponin negative, likely MSK    LOS: 17 days A FACE TO FACE EVALUATION WAS PERFORMED  Jennye Boroughs 10/18/2021, 8:03 AM

## 2021-10-18 NOTE — Progress Notes (Signed)
Patient toileted at 1815. Post void residual 304. Patient declined st cath, however agrees to get back on the toilet before she goes to bed. Will inform [place] nurse of results and patients request. Daughter at bedside

## 2021-10-19 MED ORDER — GABAPENTIN 100 MG PO CAPS
200.0000 mg | ORAL_CAPSULE | Freq: Three times a day (TID) | ORAL | Status: DC
  Administered 2021-10-19 – 2021-10-22 (×8): 200 mg via ORAL
  Filled 2021-10-19 (×8): qty 2

## 2021-10-19 MED ORDER — FLUTICASONE PROPIONATE 50 MCG/ACT NA SUSP
2.0000 | NASAL | Status: DC | PRN
Start: 2021-10-19 — End: 2021-11-07
  Administered 2021-10-28: 2 via NASAL
  Filled 2021-10-19: qty 16

## 2021-10-19 NOTE — Progress Notes (Signed)
Occupational Therapy Session Note  Patient Details  Name: Charlotte Hunter MRN: [id number] Date of Birth: 05/30/1955  Today's Date: 10/19/2021 OT Individual Time: 1404-1430 OT Individual Time Calculation (min): 26 min    Short Term Goals: Week 3:  OT Short Term Goal 1 (Week 3): STG= LTG   Skilled Therapeutic Interventions/Progress Updates:   Pt received supine with un-rated pain in her R knee pain, agreeable to OT session. Reports she is premedicated. She declined any ADLs or getting OOB for session but was agreeable to session focusing on LUE NMR. She transitioned into R sidelying to place her LUE in a gravity eliminated position. Passive range provided to entire LUE to maintain muscle length. She was instructed in visual attention to the limb and attempting muscle activation. No palpable activation in hand, elbow or shoulder. She did have scapular retraction with mod tactile cueing.1:1 NMES applied to L bicep to improve muscle activation and lengthening.  Ratio 1:3 Rate 35 pps Waveform- Asymmetric Ramp 1.0 Pulse 300 Intensity- 16 Duration -  10 Report of pain at the beginning of session 0/10 Report of pain at the end of session 0/10 No adverse reactions after treatment and is skin intact.   Guided pt through elbow flexion in unison with NMES activation. Pt given edu on CVA recovery and sensations felt through nerve recovery as pt reporting some burning/tingling through arm.   Pt left supine with all needs met, bed alarm set.     Therapy Documentation Precautions:  Precautions Precautions: Fall, Other (comment) Precaution Comments: L hemiplegia, L inattention, R hip and knee pain Restrictions Weight Bearing Restrictions: No   Therapy/Group: Individual Therapy  Curtis Sites 10/19/2021, 2:52 PM

## 2021-10-19 NOTE — Progress Notes (Addendum)
Patient ID: Charlotte Hunter, female   DOB: [birth year], 66 y.o.   MRN: [id number]  Sw received follow up determination from [facility]. Facility has decided to decline due to being unable to meet patient needs.  Sw left VM for Hoyle Sauer (Admissions) at [facility].   7/12: Patient referral faxed to AD Hoyle Sauer again. SW waiting on determination. Unable to leave VM due to VM full.

## 2021-10-19 NOTE — Progress Notes (Signed)
Physical Therapy Session Note  Patient Details  Name: Charlotte Hunter MRN: [id number] Date of Birth: [birth year]  Today's Date: 10/19/2021 PT Individual Time: 1335-1400 PT Individual Time Calculation (min): 25 min   Short Term Goals:  Week 3:  PT Short Term Goal 1 (Week 3): Pt will perform supine<>sit with min assist of 1 using bedrail PT Short Term Goal 2 (Week 3): Pt will perform sit<>stand transfers with min assist of 1 PT Short Term Goal 3 (Week 3): Pt will perform bed<>chair transfers with min assist of 1 PT Short Term Goal 4 (Week 3): Pt wil ambulate at least 17ft using LRAD with mod assist of 1 and +2 for safety PT Short Term Goal 5 (Week 3): Pt will perform at least 49ft of wheelchair mobility with min assist  Skilled Therapeutic Interventions/Progress Updates:   Pt received supine in bed and agreeable to PT at bed level.   PT performed manual therapy on BLE to reduce tone in the LLE and reduce hip pain in the RLE. With hip adductor stretch 2 x 1 min bil. HS stretch 2 x 1 min Bil, stretch into ER 2 x 1 min bil.   Supine NMR hip flexion/extension in synergy  2 x 10 with active extension on the LLE. Hip abduction/adduction x 10 BLE with AAROM on the LLE with cues  for increaed ROM. Bridge x 8 with 3 sce hold and PT stabilizing the LLE in neutral. Pt left in bed with call bell in reach and all needs met.      Therapy Documentation Precautions:  Precautions Precautions: Fall, Other (comment) Precaution Comments: L hemiplegia, L inattention, R hip and knee pain Restrictions Weight Bearing Restrictions: No    Pain: Pain Assessment Pain Score: 0-No pain     Therapy/Group: Individual Therapy  Lorie Phenix 10/19/2021, 6:03 PM

## 2021-10-19 NOTE — Progress Notes (Addendum)
Occupational Therapy Session Note  Patient Details  Name: Charlotte Hunter MRN: [id number] Date of Birth: [birth year]  Today's Date: 10/19/2021 OT Individual Time: 08:00 - 09:27 & 1050-1210 OT Individual Time Calculation (min): 80 min & 80 min    Short Term Goals: Week 3:  OT Short Term Goal 1 (Week 3): STG= LTG  First Session - Skilled Therapeutic Interventions/Progress Updates:  Pt awake in bed upon OT arrival to the room. Pt reports, "I feel funny. I'm having a spell." Pt reports numbness on L side and mouth felt "funny". RN aware of pt's report of symptoms. Pt in agreement for OT session. Pt participates in ADL session in order to improve independence with functional mobility and ADL skills using hemi-techniques needed for safe DC. Pt requested to have medication prior to mobility and ADLs. Therefore education provided for DC planning and plans for OT session while pt requested time for medications to "kick in."  Therapy Documentation Precautions:  Precautions Precautions: Fall, Other (comment) Precaution Comments: L hemiplegia, L inattention, R hip and knee pain Restrictions Weight Bearing Restrictions: No Vital Signs (last vitals charted by nursing staff):  Please see "Flowsheet" for most recent vitals charted by nursing staff. --Pt reported feeling increased numbness in L side. Vitals were taken by OT and were as follows: -BP: 145/75 - per physician report systolic BP to remain <140, RN notified -SPO2: 93% -HR: 85  Pain: Pain Scale: 0-10 Pain Rating: no pain  ADL: Upper Body Bathing: Moderate assistance (Pt requires moderate assistance to bathe UB parts due to requiring assistance to bathe BUE, manage LUE in order to bathe L axilla, and for thoroughness during a sponge bath at bed level.) Where Assessed-Upper Body Bathing: Bed level Lower Body Bathing:  (Pt requires maximal assistance to bathe B feet at bed level due to time constraint.) Where Assessed-Lower Body Bathing: Bed  level Upper Body Dressing: Moderate assistance (Pt requires moderate assistance to don a pull-over style shirt at bed level. Assist required for management of LUE and to fully pull clothing down around trunk.) Where Assessed-Upper Body Dressing: Bed level Lower Body Dressing: Maximal assistance (Pt requires maximal assistance to don briefs and pants while seated on BSC due to decreased dynamic sitting balance needed to participate in threading BLE. Pt requires moderate assistance to ensure standing balance while assist is provided to pull up.) Where Assessed-Lower Body Dressing:  (BSC) Toileting: Maximal assistance (Pt requires maximal assistance to complete 3/3 portions of toileting tasks while seated on BSC and standing to pull clothing up. Pt requires moderate assist to maintain standing balance while assist is provided for clothing management.) Where Assessed-Toileting: Bedside Commode Toilet Transfer: Moderate assistance (Pt requires moderate assistance to complete a SPT to the R from EOB > BSC > w/c with blocking of LLE.) Toilet Transfer Method: Stand pivot Toilet Transfer Equipment: Bedside commode ADL Comments: Pt requests to remain at bed level due to reports of lightheadedness. Vitals were taken and were [facility]. At end of session, pt requested to transfer to [facility]. Pt able to participate in all ADLs with moderate - maximal assistance.  Pt requested to stay in the w/c at end of session. Pt left sitting comfortably in the w/c with personal belongings and call light within reach, belt alarm placed and activated, and comfort needs attended to.   First Session - Skilled Therapeutic Interventions/Progress Updates:  Pt awake seated in w/c upon OT arrival to the room. Pt reports, "The medicine makes me feel tired." Pt in agreement for  OT session. Pt participates in therapeutic activity and neuromuscular re-education in order to improve LUE motor control and ability to problem solve during community  mobility.   Pain: Pain Scale: 0-10 Pain Rating: no pain  Therapeutic Activity:  Pt participates in pre-community mobility task of navigating in the hospital to outside. Pt able to utilize environmental cues and signs to assist with navigation with only minimal VC's. Pt utilizes familiar pictures, reads signs, and participates in problem solving with therapist in order to find the desired outdoor location.   Neuromuscular Re-Education:  Pt participates in LUE neuromuscular re-education in order to improve LUE motor control needed for improved independence with ADLs/mobility. Pt participates in performing horizontal shoulder abd/adduction using an arm skate to facilitate a gravity reduced position. Pt requires maximal assistance to facilitate movement and no muscle activation noted. Pt then tolerates NMES on with input placed on biceps to facilitate muscle contractions. Pt demo's significant muscle contractions on L biceps with 10 MA and AAROM/PROM provided to encourage elbow flexion movements. Pt tolerates approx 8 minutes of NMES with improve muscle activation noted with increased wearing time. Encouraged pt to feel belly of bicep muscle group for pt to feel the muscle contraction which improved the contractions that were noted. Skin inspected before and after with no skin integrity concerns noted. Pt reports no pain or discomfort after NMES.   ADL/Functional Mobility: Once back in room, pt requested to transfer back to bed. Pt able to complete a SPT to R side with AFO from w/c > EOB with moderate assistance. Pt requires maximal assistance to transition from seated at EOB > supine due to loading BLE into bed.   Pt returned to bed at end of session. Pt left resting comfortably in bed with personal belongings and call light within reach, bed alarm on and activated, be din low position, 3 bed rails up, and comfort needs attended to.    Therapy/Group: Individual Therapy  Lavera Guise 10/19/2021, 12:41  PM

## 2021-10-19 NOTE — Progress Notes (Signed)
PROGRESS NOTE   Subjective/Complaints:  Reports some occasional "spells"  over past few days when her numbness and pain are increased on her left side. These have improved with medications.  She had one earlier but it has improved.   ROS: Negative CP, SOB, N/V/C/D, HA, cough  Objective:   No results found. No results for input(s): "WBC", "HGB", "HCT", "PLT" in the last 72 hours.  No results for input(s): "NA", "K", "CL", "CO2", "GLUCOSE", "BUN", "CREATININE", "CALCIUM" in the last 72 hours.   Intake/Output Summary (Last 24 hours) at 10/19/2021 0755 Last data filed at 10/19/2021 0030 Gross per 24 hour  Intake 435 ml  Output 41 ml  Net 394 ml         Physical Exam: Vital Signs Blood pressure 130/67, pulse 71, temperature 98.7 F (37.1 C), temperature source Oral, resp. rate 17, height 5' 5.98" (1.676 m), weight 80.6 kg, SpO2 94 %.     General: No acute distress, sitting in bed Mood and affect are appropriate Heart: Regular rate and rhythm no rubs murmurs or extra sounds Lungs: Clear to auscultation, breathing unlabored, no rales or wheezes, good air movement Abdomen: Positive bowel sounds, soft nontender to palpation, nondistended Extremities: No clubbing, cyanosis, or edema   Pt's affect is appropriate. Pt is cooperative Skin: Clean and intact without signs of breakdown Neuro: Cranial nerves II through XII intact, motor strength is 5/5 in right and 0/5 left deltoid, bicep, tricep, grip, hip flexor or, knee extensors, ankle dorsiflexor and plantar flexor.  Trace left hip ext.  Sensory exam normal to light touch and proprioception in right and No LT sensation left upper and lower extremities.  Negative SLR, pain with Achilles stretching on left.  Musculoskeletal: FROM all 4 extremities. No joint swelling, tender right parasternal   Assessment/Plan: 1. Functional deficits which require 3+ hours per day of  interdisciplinary therapy in a comprehensive inpatient rehab setting. Physiatrist is providing close team supervision and 24 hour management of active medical problems listed below. Physiatrist and rehab team continue to assess barriers to discharge/monitor patient progress toward functional and medical goals  Care Tool:  Bathing    Body parts bathed by patient: Left arm, Chest, Abdomen, Front perineal area, Right upper leg, Left upper leg   Body parts bathed by helper: Right arm     Bathing assist Assist Level: Minimal Assistance - Patient > 75%     Upper Body Dressing/Undressing Upper body dressing   What is the patient wearing?: Pull over shirt    Upper body assist Assist Level: Moderate Assistance - Patient 50 - 74%    Lower Body Dressing/Undressing Lower body dressing      What is the patient wearing?: Pants     Lower body assist Assist for lower body dressing: Moderate Assistance - Patient 50 - 74%     Toileting Toileting Toileting Activity did not occur Press photographer and hygiene only): N/A (no void or bm)  Toileting assist Assist for toileting: Maximal Assistance - Patient 25 - 49%     Transfers Chair/bed transfer  Transfers assist  Chair/bed transfer activity did not occur: Safety/medical concerns  Chair/bed transfer assist level: Moderate Assistance -  Patient 50 - 74% (squat pivot)     Locomotion Ambulation   Ambulation assist   Ambulation activity did not occur: Safety/medical concerns  Assist level: 2 helpers Assistive device:  (L UE support around therapist and R HHA from +2 A) Max distance: 26ft   Walk 10 feet activity   Assist  Walk 10 feet activity did not occur: Safety/medical concerns        Walk 50 feet activity   Assist Walk 50 feet with 2 turns activity did not occur: Safety/medical concerns         Walk 150 feet activity   Assist Walk 150 feet activity did not occur: Safety/medical concerns         Walk  10 feet on uneven surface  activity   Assist Walk 10 feet on uneven surfaces activity did not occur: Safety/medical concerns         Wheelchair     Assist Is the patient using a wheelchair?: Yes Type of Wheelchair: Manual (TIS) Wheelchair activity did not occur: Safety/medical concerns         Wheelchair 50 feet with 2 turns activity    Assist    Wheelchair 50 feet with 2 turns activity did not occur: Safety/medical concerns       Wheelchair 150 feet activity     Assist  Wheelchair 150 feet activity did not occur: Safety/medical concerns       Blood pressure 130/67, pulse 71, temperature 98.7 F (37.1 C), temperature source Oral, resp. rate 17, height 5' 5.98" (1.676 m), weight 80.6 kg, SpO2 94 %.  Medical Problem List and Plan: 1. 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  Antithrombotics: -DVT/anticoagulation:  Mechanical: Antiembolism stockings, thigh (TED hose) Bilateral lower extremities             -antiplatelet therapy: N/A 3. Pain Management: Tylenol as needed, has burning discomfort on Left side , discomfort LLE likely due to Thalamic stroke, neg SLR , pain in L heel likely related to achilles stretch with hypersensitivity  Increase Gabapentin for neurogenic pain 300mg  QID  Fibromyalgia add low dose duloxetine 7/1 Continue 300 mg gabapentin 300 mg QID, duloxetine 20 mg 7/7 She is currently  on duloxetine 100mg  TID, occasional neuropathic pain symptoms, will increase gabapentin to 200mg  TID 4. Mood/Sleep: Provide emotional support, pt frustrated with dependant role will stop buspirone and  monitor              -antipsychotic agents: N/A 5. Neuropsych/cognition: This patient is capable of making decisions on her own behalf. 6. Skin/Wound Care: Routine skin checks 7. Fluids/Electrolytes/Nutrition: Routine in and outs with follow-up chemistries 8.  Dysphagia.  Dysphagia #3 thin liquids -on regular diet/thin 9.  Hypertension.  Cozaar 50 mg daily, Lopressor 25 mg twice daily.  Monitor with increased mobility Vitals:   10/18/21 1934 10/19/21 0524  BP: 126/63 130/67  Pulse: 78 71  Resp: 20 17  Temp: 98.2 F (36.8 C) 98.7 F (37.1 C)  SpO2: 95% 94%   Controlled  some borderline orthostatic changes related to FLomax 0.8 mg have reduced to 0.4mg   7/7 well controlled 10.  PAF.  Eliquis on hold due to [place].  Continue Lopressor.  Cardiac rate controlled.last 2 EKG were normal NSR, pt would like her cardiologist Dr Chryl Heck at [facility] to be notified of admit  11.  Obesity.  BMI 29.21.  Dietary follow-up 12.  History of recurrent UTI.  Prophylactic Macrodantin 50 mg daily.resume once pt done  7/1 Pt was requested to be put on antibiotic for "tingling sensation" with urination, WBC's normal, afebrile, per Dr. Dagoberto Ligas, MD, not indicated at this time.  52.  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  Lethargic with poor sitting balance yesterday , repeat CT head unchanged  15.  Constipation , problem at times at home, drank mineral oil and coffee to help, type 5 BM on 6/24 LBM 7/7, improved 16.  Hot flashes, many years post menopausal, , gabapentin may help but could require a higher dose   Improved on premarin  17.  Episode of CPx 1 resolved, exam has tenderness RIght parasternal, EKG and troponin negative, likely MSK    LOS: 18 days A FACE TO FACE EVALUATION WAS PERFORMED  Jennye Boroughs 10/19/2021, 7:55 AM

## 2021-10-20 NOTE — Progress Notes (Signed)
Physical Therapy Session Note  Patient Details  Name: Charlotte Hunter MRN: [id number] Date of Birth: 01/16/1956  Today's Date: 10/20/2021 PT Individual Time: 0920-1005 PT Individual Time Calculation (min): 45 min   Short Term Goals: Week 3:  PT Short Term Goal 1 (Week 3): Pt will perform supine<>sit with min assist of 1 using bedrail PT Short Term Goal 2 (Week 3): Pt will perform sit<>stand transfers with min assist of 1 PT Short Term Goal 3 (Week 3): Pt will perform bed<>chair transfers with min assist of 1 PT Short Term Goal 4 (Week 3): Pt wil ambulate at least 55ft using LRAD with mod assist of 1 and +2 for safety PT Short Term Goal 5 (Week 3): Pt will perform at least 76ft of wheelchair mobility with min assist  Skilled Therapeutic Interventions/Progress Updates:    Pt received supine in bed awake and agreeable to therapy session although reporting feeling "groggy" today due to "increase in her gabapentin" prescription. Supine>sitting R EOB, HOB elevated to 20 degrees and using bedrail, with light mod/min assist for L hemibody management - cuing for pt to utilize the movement in her L LE to assist with getting it over to EOB as pt now has slight open chain movement! Sitting EOB, with supervision for sitting balance donned LB clothing and shoes with L LE Sprystep Max GRAFO total assist for time management.   Sit>stand EOB> R UE support on bedrail with light mod assist of 1 for lifting/balance - cuing for pt to extend L knee and she can do it in [place]! But still has difficulty sustaining the contraction. L stand pivot transfer EOB>w/c with heavy mod assist of 1 for balance and L LE management with step-by-step cuing for sequencing and assist for stepping L LE while guarding/blocking L knee buckle during stance.. Transported in/out bathroom in w/c.  L stand pivot w/c>BSC over toilet using R UE support on grabbar and pt requiring slightly heavier assist at this time. Standing with mod assist  while +2 provided dependent assist for LB clothing management.   Continent of bowels and bladder. Standing with mod assist of 1 for balance and guarding L knee while +2 performed dependent peri-care and LB clothing management. R stand pivot transfer BSC over toilet>w/c with mod assist of 1 for balance and step-by-step cuing for sequencing while guarding/blocking L knee buckle during stance.   Seated in w/c at sink performed oral care with set-up assist and washed face with therapist providing hand-over-hand assist to facilitate L hand involvement in the task. Pt left seated in w/c at sink with NT present to assume care of pt to continue finishing hygiene.  Therapy Documentation Precautions:  Precautions Precautions: Fall, Other (comment) Precaution Comments: L hemiplegia, L inattention, R hip and knee pain Restrictions Weight Bearing Restrictions: No   Pain:  Continues to have burning/tingling pain in L hemibody with certain movements - repositioning and provided support to the extremity for pain management.   Therapy/Group: Individual Therapy  Ginny Forth , PT, DPT, NCS, CSRS 10/20/2021, 7:56 AM

## 2021-10-20 NOTE — Progress Notes (Signed)
PROGRESS NOTE   Subjective/Complaints:  Pt feels some increasing sensation left side of face.  Some increase in LLE hip strength   ROS: Negative CP, SOB, N/V/C/D, HA, cough  Objective:   No results found. No results for input(s): "WBC", "HGB", "HCT", "PLT" in the last 72 hours.  No results for input(s): "NA", "K", "CL", "CO2", "GLUCOSE", "BUN", "CREATININE", "CALCIUM" in the last 72 hours.   Intake/Output Summary (Last 24 hours) at 10/20/2021 0808 Last data filed at 10/19/2021 1829 Gross per 24 hour  Intake 460 ml  Output --  Net 460 ml         Physical Exam: Vital Signs Blood pressure (!) 110/53, pulse 88, temperature 98.2 F (36.8 C), temperature source Oral, resp. rate 16, height 5' 5.98" (1.676 m), weight 80.6 kg, SpO2 95 %.     General: No acute distress, sitting in bed Mood and affect are appropriate Heart: Regular rate and rhythm no rubs murmurs or extra sounds Lungs: Clear to auscultation, breathing unlabored, no rales or wheezes, good air movement Abdomen: Positive bowel sounds, soft nontender to palpation, nondistended Extremities: No clubbing, cyanosis, or edema   Pt's affect is appropriate. Pt is cooperative Skin: Clean and intact without signs of breakdown Neuro: Cranial nerves II through XII intact, motor strength is 5/5 in right and 0/5 left deltoid, bicep, tricep, grip, hip flexor or, knee extensors, ankle dorsiflexor and plantar flexor.  2-left hip ext.  Sensory exam normal to light touch and proprioception in right and No LT sensation left upper and lower extremities.  Negative SLR, pain with Achilles stretching on left.  Musculoskeletal: FROM all 4 extremities. No joint swelling, tender right parasternal   Assessment/Plan: 1. Functional deficits which require 3+ hours per day of interdisciplinary therapy in a comprehensive inpatient rehab setting. Physiatrist is providing close team supervision  and 24 hour management of active medical problems listed below. Physiatrist and rehab team continue to assess barriers to discharge/monitor patient progress toward functional and medical goals  Care Tool:  Bathing    Body parts bathed by patient: Left arm, Chest, Abdomen, Front perineal area, Right upper leg, Left upper leg   Body parts bathed by helper: Right arm     Bathing assist Assist Level: Minimal Assistance - Patient > 75%     Upper Body Dressing/Undressing Upper body dressing   What is the patient wearing?: Pull over shirt    Upper body assist Assist Level: Moderate Assistance - Patient 50 - 74%    Lower Body Dressing/Undressing Lower body dressing      What is the patient wearing?: Pants     Lower body assist Assist for lower body dressing: Moderate Assistance - Patient 50 - 74%     Toileting Toileting Toileting Activity did not occur Landscape architect and hygiene only): N/A (no void or bm)  Toileting assist Assist for toileting: Maximal Assistance - Patient 25 - 49%     Transfers Chair/bed transfer  Transfers assist  Chair/bed transfer activity did not occur: Safety/medical concerns  Chair/bed transfer assist level: Moderate Assistance - Patient 50 - 74% (squat pivot)     Locomotion Ambulation   Ambulation assist   Ambulation  activity did not occur: Safety/medical concerns  Assist level: 2 helpers Assistive device:  (L UE support around therapist and R HHA from +2 A) Max distance: 78ft   Walk 10 feet activity   Assist  Walk 10 feet activity did not occur: Safety/medical concerns        Walk 50 feet activity   Assist Walk 50 feet with 2 turns activity did not occur: Safety/medical concerns         Walk 150 feet activity   Assist Walk 150 feet activity did not occur: Safety/medical concerns         Walk 10 feet on uneven surface  activity   Assist Walk 10 feet on uneven surfaces activity did not occur: Safety/medical  concerns         Wheelchair     Assist Is the patient using a wheelchair?: Yes Type of Wheelchair: Manual (TIS) Wheelchair activity did not occur: Safety/medical concerns         Wheelchair 50 feet with 2 turns activity    Assist    Wheelchair 50 feet with 2 turns activity did not occur: Safety/medical concerns       Wheelchair 150 feet activity     Assist  Wheelchair 150 feet activity did not occur: Safety/medical concerns       Blood pressure (!) 110/53, pulse 88, temperature 98.2 F (36.8 C), temperature source Oral, resp. rate 16, height 5' 5.98" (1.676 m), weight 80.6 kg, SpO2 95 %.  Medical Problem List and Plan: 1. 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  Antithrombotics: -DVT/anticoagulation:  Mechanical: Antiembolism stockings, thigh (TED hose) Bilateral lower extremities             -antiplatelet therapy: N/A 3. Pain Management: Tylenol as needed, has burning discomfort on Left side , discomfort LLE likely due to Thalamic stroke, Fibromyalgia add low dose duloxetine  duloxetine 20 mg will increase gabapentin to 200mg  TID 4. Mood/Sleep: Provide emotional support, pt frustrated with dependant role will stop buspirone and monitor              -antipsychotic agents: N/A 5. Neuropsych/cognition: This patient is capable of making decisions on her own behalf. 6. Skin/Wound Care: Routine skin checks 7. Fluids/Electrolytes/Nutrition: Routine in and outs with follow-up chemistries 8.  Dysphagia.  Dysphagia #3 thin liquids -on regular diet/thin 9.  Hypertension.  Cozaar 50 mg daily, Lopressor 25  mg twice daily.  Monitor with increased mobility Vitals:   10/19/21 1922 10/20/21 0440  BP: 138/66 (!) 110/53  Pulse: 88 88  Resp: 16 16  Temp: 98.3 F (36.8 C) 98.2 F (36.8 C)  SpO2: 95% 95%   Controlled some borderline orthostatic changes related to FLomax 0.8 mg have reduced to 0.4mg   7/7 well controlled 10.  PAF.  Eliquis on hold due to ICH.  Continue Lopressor.  Cardiac rate controlled.last 2 EKG were normal NSR, pt would like her cardiologist Dr 12/21/21 at [facility] to be notified of admit  11.  Obesity.  BMI 29.21.  Dietary follow-up 12.  History of recurrent UTI.  Prophylactic Macrodantin 50 mg daily.resume once pt done  7/1 Pt was requested to be put on antibiotic for "tingling sensation" with urination, WBC's normal, afebrile, per Dr. Berline Chough, MD, not indicated at this time.  13.  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  Lethargic with poor sitting balance yesterday , repeat CT head unchanged  15.  Constipation , problem at times at home, drank mineral oil and coffee to help, type 5 BM on 6/24 LBM 7/7, improved 16.  Hot flashes, many years post menopausal, , gabapentin may help but could require a higher dose   Improved on premarin  17.  Episode of CPx 1 resolved, exam has tenderness RIght parasternal, EKG and troponin negative, likely MSK    LOS: 19 days A FACE TO FACE EVALUATION WAS PERFORMED  Erick Colace 10/20/2021, 8:08 AM

## 2021-10-21 MED ORDER — NITROFURANTOIN MACROCRYSTAL 50 MG PO CAPS
50.0000 mg | ORAL_CAPSULE | Freq: Every day | ORAL | Status: DC
  Administered 2021-10-22 – 2021-11-06 (×16): 50 mg via ORAL
  Filled 2021-10-21 (×17): qty 1

## 2021-10-21 NOTE — Progress Notes (Signed)
PROGRESS NOTE   Subjective/Complaints:  No issues overnite , several BMs yesterday , had Left heel pain last noc  but could not specifiy exactly where, this was short lived, responded to RObaxin and tylenol  Reported to have PRAFO on    ROS: Negative CP, SOB, N/V/C/D, HA, cough  Objective:   No results found. No results for input(s): "WBC", "HGB", "HCT", "PLT" in the last 72 hours.  No results for input(s): "NA", "K", "CL", "CO2", "GLUCOSE", "BUN", "CREATININE", "CALCIUM" in the last 72 hours.   Intake/Output Summary (Last 24 hours) at 10/21/2021 0843 Last data filed at 10/21/2021 0732 Gross per 24 hour  Intake 776 ml  Output --  Net 776 ml         Physical Exam: Vital Signs Blood pressure (!) 129/56, pulse 65, temperature (!) 97.5 F (36.4 C), resp. rate 16, height 5' 5.98" (1.676 m), weight 80.6 kg, SpO2 95 %.  General: No acute distress, sitting in bed Mood and affect are appropriate Heart: Regular rate and rhythm no rubs murmurs or extra sounds Lungs: Clear to auscultation, breathing unlabored, no rales or wheezes, good air movement Abdomen: Positive bowel sounds, soft nontender to palpation, nondistended Extremities: No clubbing, cyanosis, or edema   Pt's affect is appropriate. Pt is cooperative Skin: Clean and intact without signs of breakdown Neuro: Cranial nerves II through XII intact, motor strength is 5/5 in right and 0/5 left deltoid, bicep, tricep, grip, hip flexor or, knee extensors, ankle dorsiflexor and plantar flexor.  2-left hip ext.  Sensory exam normal to light touch and proprioception in right and No LT sensation left upper and lower extremities.  Negative SLR, pain with Achilles stretching on left.  Musculoskeletal: FROM all 4 extremities. No joint swelling, tender right parasternal   Assessment/Plan: 1. Functional deficits which require 3+ hours per day of interdisciplinary therapy in a  comprehensive inpatient rehab setting. Physiatrist is providing close team supervision and 24 hour management of active medical problems listed below. Physiatrist and rehab team continue to assess barriers to discharge/monitor patient progress toward functional and medical goals  Care Tool:  Bathing    Body parts bathed by patient: Left arm, Chest, Abdomen, Front perineal area, Right upper leg, Left upper leg   Body parts bathed by helper: Right arm     Bathing assist Assist Level: Minimal Assistance - Patient > 75%     Upper Body Dressing/Undressing Upper body dressing   What is the patient wearing?: Pull over shirt    Upper body assist Assist Level: Moderate Assistance - Patient 50 - 74%    Lower Body Dressing/Undressing Lower body dressing      What is the patient wearing?: Pants     Lower body assist Assist for lower body dressing: Moderate Assistance - Patient 50 - 74%     Toileting Toileting Toileting Activity did not occur Landscape architect and hygiene only): N/A (no void or bm)  Toileting assist Assist for toileting: Maximal Assistance - Patient 25 - 49%     Transfers Chair/bed transfer  Transfers assist  Chair/bed transfer activity did not occur: Safety/medical concerns  Chair/bed transfer assist level: Moderate Assistance - Patient 50 -  74% (squat pivot)     Locomotion Ambulation   Ambulation assist   Ambulation activity did not occur: Safety/medical concerns  Assist level: 2 helpers Assistive device:  (L UE support around therapist and R HHA from +2 A) Max distance: 34ft   Walk 10 feet activity   Assist  Walk 10 feet activity did not occur: Safety/medical concerns        Walk 50 feet activity   Assist Walk 50 feet with 2 turns activity did not occur: Safety/medical concerns         Walk 150 feet activity   Assist Walk 150 feet activity did not occur: Safety/medical concerns         Walk 10 feet on uneven surface   activity   Assist Walk 10 feet on uneven surfaces activity did not occur: Safety/medical concerns         Wheelchair     Assist Is the patient using a wheelchair?: Yes Type of Wheelchair: Manual (TIS) Wheelchair activity did not occur: Safety/medical concerns         Wheelchair 50 feet with 2 turns activity    Assist    Wheelchair 50 feet with 2 turns activity did not occur: Safety/medical concerns       Wheelchair 150 feet activity     Assist  Wheelchair 150 feet activity did not occur: Safety/medical concerns       Blood pressure (!) 129/56, pulse 65, temperature (!) 97.5 F (36.4 C), resp. rate 16, height 5' 5.98" (1.676 m), weight 80.6 kg, SpO2 95 %.  Medical Problem List and Plan: 1. 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  Antithrombotics: -DVT/anticoagulation:  Mechanical: Antiembolism stockings, thigh (TED hose) Bilateral lower extremities             -antiplatelet therapy: N/A 3. Pain Management: Tylenol as needed, has burning discomfort on Left side , discomfort LLE likely due to Thalamic stroke, Fibromyalgia add low dose duloxetine  duloxetine 20 mg will increase gabapentin to 200mg  TID 4. Mood/Sleep: Provide emotional support, pt frustrated with dependant role will stop buspirone and monitor              -antipsychotic agents: N/A 5. Neuropsych/cognition: This patient is capable of making decisions on her own behalf. 6. Skin/Wound Care: Routine skin checks 7. Fluids/Electrolytes/Nutrition: Routine in and outs with follow-up chemistries 8.  Dysphagia.  Dysphagia #3 thin liquids -on  regular diet/thin 9.  Hypertension.  Cozaar 50 mg daily, Lopressor 25 mg twice daily.  Monitor with increased mobility Vitals:   10/20/21 1938 10/21/21 0535  BP: 140/64 (!) 129/56  Pulse: 79 65  Resp: 18 16  Temp: 98.2 F (36.8 C) (!) 97.5 F (36.4 C)  SpO2: 94% 95%   Controlled some borderline orthostatic changes related to FLomax 0.8 mg have reduced to 0.4mg   7/7 well controlled 10.  PAF.  Eliquis on hold due to ICH.  Continue Lopressor.  Cardiac rate controlled.last 2 EKG were normal NSR, pt would like her cardiologist Dr  Komada at [facility] to be notified of admit  11.  Obesity.  BMI 29.21.  Dietary follow-up 12.  History of recurrent UTI.  Prophylactic Macrodantin 50 mg daily.resume once pt done  Urinary retention resolved   13.  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  Lethargic with poor sitting balance yesterday , repeat CT head unchanged  15.  Constipation , problem at times at home, drank mineral oil and coffee to help, type 5 BM on 6/24 LBM 7/8, improved 16.  Hot flashes, many years post menopausal, , gabapentin may help but could require a higher dose   Improved on premarin  17.  Episode of CPx 1 resolved, exam has tenderness RIght parasternal, EKG and troponin negative, likely MSK    LOS: 20 days A FACE TO FACE EVALUATION WAS PERFORMED  Erick Colace 10/21/2021, 8:43 AM

## 2021-10-21 NOTE — Progress Notes (Signed)
Occupational Therapy Session Note  Patient Details  Name: Charlotte Hunter MRN: [id number] Date of Birth: 1955/04/28  Today's Date: 10/21/2021 OT Individual Time: 1115-1205 OT Individual Time Calculation (min): 50 min    Short Term Goals: Week 2:  OT Short Term Goal 1 (Week 2): pt will engage in one grooming tasks from w/c at sink with MOD A OT Short Term Goal 1 - Progress (Week 2): Met OT Short Term Goal 2 (Week 2): pt will completed 1/3 toileting tasks with MAXA OT Short Term Goal 2 - Progress (Week 2): Met OT Short Term Goal 3 (Week 2): pt will complete sit>stand with MOD A OT Short Term Goal 3 - Progress (Week 2): Met  Skilled Therapeutic Interventions/Progress Updates:    Upon OT arrival, pt semi recumbent in bed and requesting to get cleaned up. Pt completes UB bathing/dressing, LB dressing, and grooming at the levels stated below. Pt sits EOB with SBA to complete. Pt requires min verbal cues for encouragement and to sequence task. Pt completes sit to stand transfer with Mod A and bed to w/c transfer with Mod A. Pt progressing towards stated OT goals and continues to benefit from OT services to achieve highest level of independence. Pt left in w/c at end of session with all needs met.   Therapy Documentation Precautions:  Precautions Precautions: Fall, Other (comment) Precaution Comments: L hemiplegia, L inattention, R hip and knee pain Restrictions Weight Bearing Restrictions: No  ADL: Where Assessed-Eating: Bed level Grooming: Setup Where Assessed-Grooming: Sitting at sink Upper Body Bathing: Minimal assistance Where Assessed-Upper Body Bathing: Edge of bed Where Assessed-Lower Body Bathing: Bed level Upper Body Dressing: Minimal assistance Where Assessed-Upper Body Dressing: Edge of bed Lower Body Dressing: Moderate assistance Where Assessed-Lower Body Dressing: Edge of bed   Therapy/Group: Individual Therapy  Charlotte Hunter 10/21/2021, 12:24 PM

## 2021-10-22 MED ORDER — GABAPENTIN 100 MG PO CAPS
100.0000 mg | ORAL_CAPSULE | Freq: Three times a day (TID) | ORAL | Status: DC
  Administered 2021-10-22 – 2021-11-06 (×47): 100 mg via ORAL
  Filled 2021-10-22 (×47): qty 1

## 2021-10-22 MED ORDER — METOPROLOL TARTRATE 25 MG PO TABS
25.0000 mg | ORAL_TABLET | Freq: Two times a day (BID) | ORAL | Status: DC
Start: 2021-10-22 — End: 2021-11-07
  Administered 2021-10-22 – 2021-11-06 (×31): 25 mg via ORAL
  Filled 2021-10-22 (×34): qty 1

## 2021-10-22 NOTE — Progress Notes (Signed)
PROGRESS NOTE   Subjective/Complaints:  Pt reports slept well- felt LUE some overnight for first time.  LBM yesterday.  Occ burning/shooting pain, but usually no pain.  Gabapentin increased knocked her out- want to reduce dose.  Also thinks it causes "white cloud that comes over her vision'.  Also upset that BP meds come "late"- they are not due until 8am in morning and she feels it's causing problems for her to not receive exactly at or before 8am.   Will change to 7am, so can get by 8am.   ROS:  Pt denies SOB, abd pain, CP, N/V/C/D, and vision changes   Objective:   No results found. No results for input(s): "WBC", "HGB", "HCT", "PLT" in the last 72 hours.  No results for input(s): "NA", "K", "CL", "CO2", "GLUCOSE", "BUN", "CREATININE", "CALCIUM" in the last 72 hours.   Intake/Output Summary (Last 24 hours) at 10/22/2021 1906 Last data filed at 10/22/2021 1842 Gross per 24 hour  Intake 240 ml  Output --  Net 240 ml        Physical Exam: Vital Signs Blood pressure 127/67, pulse 73, temperature (!) 97.5 F (36.4 C), resp. rate 19, height 5' 5.98" (1.676 m), weight 80.6 kg, SpO2 99 %.   General: awake, alert, appropriate, NAD HENT: conjugate gaze; oropharynx moist CV: regular rate; no JVD Pulmonary: CTA B/L; no W/R/R- good air movement GI: soft, NT, ND, (+)BS Psychiatric: appropriate but perseverating on BP meds Neurological: alert Pt's affect is appropriate. Pt is cooperative Skin: Clean and intact without signs of breakdown Neuro: Cranial nerves II through XII intact, motor strength is 5/5 in right and 0/5 left deltoid, bicep, tricep, grip, hip flexor or, knee extensors, ankle dorsiflexor and plantar flexor.  2-left hip ext.  Sensory exam normal to light touch and proprioception in right and No LT sensation left upper and lower extremities.  Negative SLR, pain with Achilles stretching on left.   Musculoskeletal: FROM all 4 extremities. No joint swelling, tender right parasternal   Assessment/Plan: 1. Functional deficits which require 3+ hours per day of interdisciplinary therapy in a comprehensive inpatient rehab setting. Physiatrist is providing close team supervision and 24 hour management of active medical problems listed below. Physiatrist and rehab team continue to assess barriers to discharge/monitor patient progress toward functional and medical goals  Care Tool:  Bathing    Body parts bathed by patient: Left arm, Chest, Abdomen, Front perineal area, Right upper leg, Left upper leg, Face   Body parts bathed by helper: Right arm, Buttocks, Right lower leg, Left lower leg     Bathing assist Assist Level: Minimal Assistance - Patient > 75%     Upper Body Dressing/Undressing Upper body dressing   What is the patient wearing?: Pull over shirt    Upper body assist Assist Level: Minimal Assistance - Patient > 75%    Lower Body Dressing/Undressing Lower body dressing      What is the patient wearing?: Pants     Lower body assist Assist for lower body dressing: Moderate Assistance - Patient 50 - 74%     Toileting Toileting Toileting Activity did not occur (Clothing management and hygiene only): N/A (no  void or bm)  Toileting assist Assist for toileting: Maximal Assistance - Patient 25 - 49%     Transfers Chair/bed transfer  Transfers assist  Chair/bed transfer activity did not occur: Safety/medical concerns  Chair/bed transfer assist level: Moderate Assistance - Patient 50 - 74%     Locomotion Ambulation   Ambulation assist   Ambulation activity did not occur: Safety/medical concerns  Assist level: 2 helpers Assistive device: Other (comment) (rail in hallway) Max distance: 15'   Walk 10 feet activity   Assist  Walk 10 feet activity did not occur: Safety/medical concerns  Assist level: 2 helpers Assistive device: Other (comment) (rail in  hallway)   Walk 50 feet activity   Assist Walk 50 feet with 2 turns activity did not occur: Safety/medical concerns         Walk 150 feet activity   Assist Walk 150 feet activity did not occur: Safety/medical concerns         Walk 10 feet on uneven surface  activity   Assist Walk 10 feet on uneven surfaces activity did not occur: Safety/medical concerns         Wheelchair     Assist Is the patient using a wheelchair?: Yes Type of Wheelchair: Manual (TIS) Wheelchair activity did not occur: Safety/medical concerns         Wheelchair 50 feet with 2 turns activity    Assist    Wheelchair 50 feet with 2 turns activity did not occur: Safety/medical concerns       Wheelchair 150 feet activity     Assist  Wheelchair 150 feet activity did not occur: Safety/medical concerns       Blood pressure 127/67, pulse 73, temperature (!) 97.5 F (36.4 C), resp. rate 19, height 5' 5.98" (1.676 m), weight 80.6 kg, SpO2 99 %.  Medical Problem List and Plan: 1. 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  Antithrombotics: -DVT/anticoagulation:  Mechanical: Antiembolism stockings, thigh (TED hose) Bilateral lower extremities             -antiplatelet therapy: N/A 3. Pain Management: Tylenol as needed, has burning discomfort on Left side , discomfort LLE likely due to Thalamic stroke, Fibromyalgia add low dose duloxetine  duloxetine 20 mg will increase gabapentin to 200mg  TID 7/10- will reduce gabapentin to 100 mg TID by insistence of pt.  4. Mood/Sleep: Provide emotional support,  pt frustrated with dependant role will stop buspirone and monitor              -antipsychotic agents: N/A 5. Neuropsych/cognition: This patient is capable of making decisions on her own behalf. 6. Skin/Wound Care: Routine skin checks 7. Fluids/Electrolytes/Nutrition: Routine in and outs with follow-up chemistries 8.  Dysphagia.  Dysphagia #3 thin liquids -on regular diet/thin 9.  Hypertension.  Cozaar 50 mg daily, Lopressor 25 mg twice daily.  Monitor with increased mobility Vitals:   10/22/21 0453 10/22/21 1254  BP: 122/67 127/67  Pulse: (!) 55 73  Resp: 16 19  Temp: (!) 97.5 F (36.4 C)   SpO2: 93% 99%   Controlled some borderline orthostatic changes related to FLomax 0.8 mg have reduced to 0.4mg   7/7 well controlled  7/10- will change AM Metorpolol to 0700 so can make sure gets by 8am 10.  PAF.  Eliquis on hold due to ICH.  Continue Lopressor.  Cardiac rate controlled.last 2 EKG were normal NSR, pt would like her cardiologist Dr Katherine Mantle at [facility] to be notified of admit  11.  Obesity.  BMI 29.21.  Dietary follow-up 12.  History of recurrent UTI.  Prophylactic Macrodantin 50 mg daily.resume once pt done  Urinary retention resolved   13.  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  Lethargic with poor sitting balance yesterday , repeat CT head unchanged  15.  Constipation , problem at times at home, drank mineral oil and coffee to help, type 5 BM on 6/24 LBM 7/8, improved 16.  Hot flashes, many years post menopausal, , gabapentin may help but could require a higher dose   Improved on premarin  17.  Episode of CPx 1 resolved, exam has tenderness RIght parasternal, EKG and troponin negative, likely MSK    I spent a total of 39   minutes on total care today- >50% coordination of care- due to  discussion with nursing about BP meds and d/w pt about pain/BP meds  LOS: 21 days A FACE TO  FACE EVALUATION WAS PERFORMED  Mickala Laton 10/22/2021, 7:06 PM

## 2021-10-22 NOTE — Progress Notes (Signed)
Physical Therapy Session Note  Patient Details  Name: Charlotte Hunter MRN: [id number] Date of Birth: 1955/07/13  Today's Date: 10/22/2021 PT Individual Time: 0800-0900 PT Individual Time Calculation (min): 60 min   Short Term Goals: Week 3:  PT Short Term Goal 1 (Week 3): Pt will perform supine<>sit with min assist of 1 using bedrail PT Short Term Goal 2 (Week 3): Pt will perform sit<>stand transfers with min assist of 1 PT Short Term Goal 3 (Week 3): Pt will perform bed<>chair transfers with min assist of 1 PT Short Term Goal 4 (Week 3): Pt wil ambulate at least 64ft using LRAD with mod assist of 1 and +2 for safety PT Short Term Goal 5 (Week 3): Pt will perform at least 11ft of wheelchair mobility with min assist  Skilled Therapeutic Interventions/Progress Updates:    Pt received seated in bed receiving AM medications from nursing. Pt agreeable to PT session. Pt has pain in BLE, nursing able to provide pain medication at beginning of session and applies Lidocaine patch and Voltarin gel. Supine to sit with min A needed for some trunk elevation. Sit to stand from EOB with mod A x 2 with HHA to doff pajama pants and don shorts. Squat pivot transfer to the R with mod A to w/c. Pt is setup A for oral hygiene while seated in w/c at sink. Assisted pt with donning tennis shoes and LLE AFO. Pt taken to quiet environment to attempt gait training due to being easily distracted as well as overstimulated by environment. Attempt gait training with mod HHA x 2, pt becomes fearful of falling and unable to take steps. Pt taken to rain in hallway for increased security. Ambulation x 15 ft with R handrail and close w/c follow, max A needed for lateral weight shifting and to advance LLE. Pt reports increased difficulty with gait this date due to limited therapy and time spent OOB over the weekend. Pt left seated in w/c in room with needs in reach, quick release belt and chair alarm in place at end of  session.  Therapy Documentation Precautions:  Precautions Precautions: Fall, Other (comment) Precaution Comments: L hemiplegia, L inattention, R hip and knee pain Restrictions Weight Bearing Restrictions: No       Therapy/Group: Individual Therapy   Peter Congo, PT, DPT, CSRS 10/22/2021, 12:18 PM

## 2021-10-22 NOTE — Progress Notes (Signed)
Occupational Therapy Session Note  Patient Details  Name: Charlotte Hunter MRN: [id number] Date of Birth: 24-Apr-1955  Today's Date: 10/22/2021 OT Individual Time: 0902-1020 session 1 OT Individual Time Calculation (min): 78 min  Session 2: 1415- 1530    Short Term Goals: Week 3:  OT Short Term Goal 1 (Week 3): STG= LTG  Skilled Therapeutic Interventions/Progress Updates:  Session 1: Pt greeted seated in w/c  agreeable to OT intervention. Session focus on BADL reeducation, functional mobility, dynamic standing balance and decreasing overall caregiver burden.      Pt completed sit>stand from w/c to stedy with MIN A, total A transport to toilet in stedy. Pt needed MAX A for 3/3 toileting tasks needing assist for pericare and clothing mgmt, total A transport to shower in stedy. Pt completed bathing from shower seat with overall MIN A. Pt exited shower with stedy with total A. Pt completed dressing from w/c with MIN A for UB Dressing, improved carryover of compensatory technique. MOD A to don pants needing assist to thread LLE and pull pants up to waist line. Pt able to stand x2 at sink with MODA. Pt completed stand pivot back to bed with MOD A. MOD A for sit>supine. pt left supine in bed with bed alarm activated and all needs within reach.                      Session 2:Pt receied asleep in supine pt easily able to arouse and       agreeable to OT intervention.pt requested to work on estim from bed level d/t fatigue. Pt completed 2 rounds of estim for 15 mins each to focus on wrist flexion/extension  1:1 NMES applied to L wrist extensors  at the below settings:    Ratio 1:3 Rate 35 pps Waveform- Asymmetric Ramp 1.0 Pulse 300 Intensity- 27 Duration -  15 mins Pt with great wrist extension from above settings; no pain adverse reactions noted during/after session.   1:1 NMES applied to L wrist flexors at the below settings:    Ratio 1:3 Rate 35 pps Waveform- Asymmetric Ramp 1.0 Pulse  300 Intensity- 26  Duration -  15 mins Pt with great wrist flexion with an emphasis on functional grasp on compliant cube during estim.   Of note pt noted to have active trace movement in L 3rd and 4th digit able to wiggle fingers around cube! Pt very excited about this!  Pt requested to void b/b able to transition from supine>sit with MOD A. Pt completed stand pivot ( face to face)  to w/c to R side with MODA, pt needs assist to scoot LLE during pivot.  Pt also able to complete squat pivot from w/c <>BSC over toilet using grab bars to L side with MODA ! Did utilized stedy for pericare as pt with BM needing extended time in standing for cleanliness.  Pt transferred back to bed with MOD A to elevate BLEs back to supine. Pt left supine in bed with bed alarm activated and all needs within reach.    Therapy Documentation Precautions:  Precautions Precautions: Fall, Other (comment) Precaution Comments: L hemiplegia, L inattention, R hip and knee pain Restrictions Weight Bearing Restrictions: No  Pain: Pt continues to report burning pain on L side, rest breaks, distraction and repositioning all used as pain mgmt strategies.     Therapy/Group: Individual Therapy  Pollyann Glen [facility] 10/22/2021, 12:14 PM

## 2021-10-23 MED ORDER — POLYETHYLENE GLYCOL 3350 17 G PO PACK
17.0000 g | PACK | Freq: Every day | ORAL | Status: DC | PRN
  Administered 2021-11-02: 17 g via ORAL
  Filled 2021-10-23: qty 1

## 2021-10-23 NOTE — Progress Notes (Signed)
PROGRESS NOTE   Subjective/Complaints:  Moving Left fingers a bit   ROS:  Pt denies SOB, abd pain, CP, N/V/C/D, and vision changes   Objective:   No results found. No results for input(s): "WBC", "HGB", "HCT", "PLT" in the last 72 hours.  No results for input(s): "NA", "K", "CL", "CO2", "GLUCOSE", "BUN", "CREATININE", "CALCIUM" in the last 72 hours.   Intake/Output Summary (Last 24 hours) at 10/23/2021 0843 Last data filed at 10/23/2021 0811 Gross per 24 hour  Intake 840 ml  Output --  Net 840 ml         Physical Exam: Vital Signs Blood pressure (!) 101/51, pulse 60, temperature 97.8 F (36.6 C), resp. rate 14, height 5' 5.98" (1.676 m), weight 80.6 kg, SpO2 92 %.   General: No acute distress Mood and affect are appropriate Heart: Regular rate and rhythm no rubs murmurs or extra sounds Lungs: Clear to auscultation, breathing unlabored, no rales or wheezes Abdomen: Positive bowel sounds, soft nontender to palpation, nondistended Extremities: No clubbing, cyanosis, or edema   Skin: Clean and intact without signs of breakdown Neuro: Cranial nerves II through XII intact, motor strength is 5/5 in right and 0/5 left deltoid, bicep, tricep, trace finger flexors and pronators , hip flexor or, knee extensors, ankle dorsiflexor and plantar flexor.  2-left hip ext and add.  Sensory exam normal to light touch and proprioception in right and No LT sensation left upper and lower extremities.  Negative SLR, pain with Achilles stretching on left.  Musculoskeletal: FROM all 4 extremities. No joint swelling, tender right parasternal   Assessment/Plan: 1. Functional deficits which require 3+ hours per day of interdisciplinary therapy in a comprehensive inpatient rehab setting. Physiatrist is providing close team supervision and 24 hour management of active medical problems listed below. Physiatrist and rehab team continue to  assess barriers to discharge/monitor patient progress toward functional and medical goals  Care Tool:  Bathing    Body parts bathed by patient: Left arm, Chest, Abdomen, Front perineal area, Right upper leg, Left upper leg, Face   Body parts bathed by helper: Right arm, Buttocks, Right lower leg, Left lower leg     Bathing assist Assist Level: Minimal Assistance - Patient > 75%     Upper Body Dressing/Undressing Upper body dressing   What is the patient wearing?: Pull over shirt    Upper body assist Assist Level: Minimal Assistance - Patient > 75%    Lower Body Dressing/Undressing Lower body dressing      What is the patient wearing?: Pants     Lower body assist Assist for lower body dressing: Moderate Assistance - Patient 50 - 74%     Toileting Toileting Toileting Activity did not occur Press photographer and hygiene only): N/A (no void or bm)  Toileting assist Assist for toileting: Maximal Assistance - Patient 25 - 49%     Transfers Chair/bed transfer  Transfers assist  Chair/bed transfer activity did not occur: Safety/medical concerns  Chair/bed transfer assist level: Moderate Assistance - Patient 50 - 74%     Locomotion Ambulation   Ambulation assist   Ambulation activity did not occur: Safety/medical concerns  Assist level: 2 helpers  Assistive device: Other (comment) (rail in hallway) Max distance: 15'   Walk 10 feet activity   Assist  Walk 10 feet activity did not occur: Safety/medical concerns  Assist level: 2 helpers Assistive device: Other (comment) (rail in hallway)   Walk 50 feet activity   Assist Walk 50 feet with 2 turns activity did not occur: Safety/medical concerns         Walk 150 feet activity   Assist Walk 150 feet activity did not occur: Safety/medical concerns         Walk 10 feet on uneven surface  activity   Assist Walk 10 feet on uneven surfaces activity did not occur: Safety/medical concerns          Wheelchair     Assist Is the patient using a wheelchair?: Yes Type of Wheelchair: Manual (TIS) Wheelchair activity did not occur: Safety/medical concerns         Wheelchair 50 feet with 2 turns activity    Assist    Wheelchair 50 feet with 2 turns activity did not occur: Safety/medical concerns       Wheelchair 150 feet activity     Assist  Wheelchair 150 feet activity did not occur: Safety/medical concerns       Blood pressure (!) 101/51, pulse 60, temperature 97.8 F (36.6 C), resp. rate 14, height 5' 5.98" (1.676 m), weight 80.6 kg, SpO2 92 %.  Medical Problem List and Plan: 1. 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  Antithrombotics: -DVT/anticoagulation:  Mechanical: Antiembolism stockings, thigh (TED hose) Bilateral lower extremities             -antiplatelet therapy: N/A 3. Pain Management: Tylenol as needed, has burning discomfort on Left side , discomfort LLE likely due to Thalamic stroke, Fibromyalgia add low dose duloxetine  duloxetine 20 mg will increase gabapentin to 200mg  TID 7/10- will reduce gabapentin to 100 mg TID by insistence of pt.  4. Mood/Sleep: Provide emotional support, pt frustrated with dependant role will stop buspirone and monitor              -antipsychotic agents: N/A 5. Neuropsych/cognition: This patient is capable of making decisions on her own behalf. 6. Skin/Wound Care: Routine skin checks 7. Fluids/Electrolytes/Nutrition: Routine in and outs with follow-up chemistries 8.  Dysphagia.  Dysphagia #3 thin liquids -on regular diet/thin 9.   Hypertension.  Cozaar 50 mg daily, Lopressor 25 mg twice daily.  Monitor with increased mobility Vitals:   10/23/21 0402 10/23/21 0404  BP:  (!) 101/51  Pulse: 61 60  Resp:  14  Temp: 97.8 F (36.6 C) 97.8 F (36.6 C)  SpO2: 96% 92%   Controlled some borderline orthostatic changes related to FLomax 0.8 mg have reduced to 0.4mg   7/7 well controlled  7/10- will change AM Metoprolol to 0700 so can make sure gets by 8am 10.  PAF.  Eliquis on hold due to [address].  Continue Lopressor.  Cardiac rate controlled.last 2 EKG were normal NSR,  pt would like her cardiologist Dr Katherine Mantle at [facility] to be notified of admit  11.  Obesity.  BMI 29.21.  Dietary follow-up 12.  History of recurrent UTI.  Prophylactic Macrodantin 50 mg daily.resume once pt done  Urinary retention resolved   13.  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  Lethargic with poor sitting balance yesterday , repeat CT head unchanged  15.  Constipation ,resolved change miralax to PRN (was BID)  16.  Hot flashes, many years post menopausal, , gabapentin may help but could require a higher dose   Improved on premarin  17.  Episode of CPx 1 resolved, exam has tenderness RIght parasternal, EKG and troponin negative, likely MSK      LOS: 22 days A FACE TO FACE EVALUATION WAS PERFORMED  Erick Colace 10/23/2021, 8:43 AM

## 2021-10-23 NOTE — Progress Notes (Signed)
Occupational Therapy Session Note  Patient Details  Name: Carlyon Nolasco MRN: [id number] Date of Birth: March 04, 1956  Today's Date: 10/23/2021 OT Individual Time: 4431-5400 OT Individual Time Calculation (min): 50 min    Short Term Goals: Week 3:  OT Short Term Goal 1 (Week 3): STG= LTG  Skilled Therapeutic Interventions/Progress Updates:    Patient received sitting up in wheelchair.  Patient reports nursing staff assisted with dressing lower body.  Patient reports showering yesterday - so happy to wash and dress upper body and complete grooming tasks.  Skilled OT intervention to address postural control, forward flexion as needed for bathing and dressing tasks, cognitive skills - selective attention and problem solving, and compensatory strategies to increase autonomy with basic self care skills.  Patient able to direct much of her care this am.  Patient needs encouragement to attempt task first without seeking help - although this has improved.  Patient needs further practice with shifting mass forward over base of support as needed for transfers.  Patient aware that team conference is tomorrow and she is eager to her team's recommendations.   Patient left up in wheelchair, safety belt in place and call bell in lap.  Elevated armrests of wheelchair for improved UE and trunk support.    Therapy Documentation Precautions:  Precautions Precautions: Fall, Other (comment) Precaution Comments: L hemiplegia, L inattention, R hip and knee pain Restrictions Weight Bearing Restrictions: No   Pain: No report of pain this session   Therapy/Group: Individual Therapy  Collier Salina 10/23/2021, 12:28 PM

## 2021-10-23 NOTE — Progress Notes (Signed)
Physical Therapy Weekly Progress Note  Patient Details  Name: Charlotte Hunter MRN: [id number] Date of Birth: [birth year]  Beginning of progress report period: October 16, 2021 End of progress report period: October 23, 2021  Today's Date: 10/23/2021 PT Individual Time: 0177-9390 and 1620-1735 PT Individual Time Calculation (min): 40 min and 75 min  Patient has met 1 of 5 short term goals. Charlotte Hunter is making excellent progress towards her LTGs despite only meeting 1 STG this week due to her recently achieving a min assist level with certain functional mobility tasks, but needing to do so consistently prior to considering those goals having been met. She is performing supine<>sit using bed features with min (intermittent mod assist), sit<>stand and squat pivot transfers with primarily min assist (intermittent mod) and has advanced to performing stand pivot transfers with mod assist. She is participating in gait training achieving up to 29f using R HHA from +2 assist to challenge her balance and promote L LE WBing with mod assist from therapist for L LE gait mechanics and balance - she is now able to advance L LE 90-100% during swing requiring assist to reposition and only min assist to stabilize during stance control. She continues to have motor return in L hemibody allowing further advancement of her independence with functional mobility. She will benefit from continued CIR level skilled physical therapy.   Patient continues to demonstrate the following deficits muscle weakness, muscle joint tightness, and muscle paralysis, decreased cardiorespiratoy endurance, impaired timing and sequencing, abnormal tone, unbalanced muscle activation, and decreased coordination,  , and decreased sitting balance, decreased standing balance, decreased postural control, hemiplegia, and decreased balance strategies and therefore will continue to benefit from skilled PT intervention to increase functional independence with  mobility.  Patient progressing toward long term goals..  Continue plan of care.  PT Short Term Goals Week 3:  PT Short Term Goal 1 (Week 3): Pt will perform supine<>sit with min assist of 1 using bedrail PT Short Term Goal 1 - Progress (Week 3): Progressing toward goal PT Short Term Goal 2 (Week 3): Pt will perform sit<>stand transfers with min assist of 1 PT Short Term Goal 2 - Progress (Week 3): Progressing toward goal PT Short Term Goal 3 (Week 3): Pt will perform bed<>chair transfers with min assist of 1 PT Short Term Goal 3 - Progress (Week 3): Progressing toward goal PT Short Term Goal 4 (Week 3): Pt wil ambulate at least 561fusing LRAD with mod assist of 1 and +2 for safety PT Short Term Goal 4 - Progress (Week 3): Met PT Short Term Goal 5 (Week 3): Pt will perform at least 2568ff wheelchair mobility with min assist PT Short Term Goal 5 - Progress (Week 3): Progressing toward goal Week 4:  PT Short Term Goal 1 (Week 4): = to LTGs based on ELOS  Skilled Therapeutic Interventions/Progress Updates:  Ambulation/gait training;Community reintegration;DME/adaptive equipment instruction;Neuromuscular re-education;Psychosocial support;Stair training;UE/LE Strength taining/ROM;Wheelchair propulsion/positioning;Balance/vestibular training;Discharge planning;Functional electrical stimulation;Pain management;Skin care/wound management;UE/LE Coordination activities;Therapeutic Activities;Cognitive remediation/compensation;Disease management/prevention;Functional mobility training;Patient/family education;Splinting/orthotics;Therapeutic Exercise;Visual/perceptual remediation/compensation   Session 1: Pt received sitting in w/c with her friend present and pt eager to participate in therapy session and show therapist the new movement she has in L hemibody including active finger flexion through partial ROM and initiation of 2nd finger extension and forearm supination. Pt also now able to perform open  chain active L LE knee extension to take her foot off of the w/c leg rest. Donned B LE shoes and L  LE Sprystep Max GRAFO total assist for time management.  Transported to/from gym in w/c for time management and energy conservation.  Sit>stand w/c>R HHA from +2 assist with min assist from therapist for lifting/balance - continued cuing to scoot towards edge of seat and achieve sufficient anterior trunk lean to rise into standing with more independence.  Gait training 70f x2 using R HHA from +2 and light mod assist from therapist for L LE management and balance as follows:  - step-by-step verbal cuing for sequencing LE steps - able to advance L LE 100% of swing with circumduction compensation due to lack of sufficient knee flexion as well as excessive hip adduction and internal rotation with cuing for improvement - stabilizing L knee during stance with min guarding of buckle for safety but primarily focusing more on verbal cuing for active knee extension "gently" to avoid hyperextension (manually guarding/blocking that)  - still has L lateral trunk lean/LOB during L swing phase due to delayed swing phase advancement requiring prolonged R stance phase   Transported back to room and pt left seated in w/c with needs in reach, seat belt alarm on, and L UE supported.   Session 2: Pt received supine in bed with her daughter present and pt agreeable to therapy session. Supine>sitting R EOB, HOB partially elevated and using bedrail, with light min assist only for L UE management and verbal/visual cuing for independence with L LE movement to EOB. Sitting EOB donned B LE tennis shoes and L LE Sprystep Max GRAFO total assist for time management. R squat/partial stand pivot transfer EOB>w/c with min assist for balance and L UE management.  Transported to/from gym in w/c for time management and energy conservation.  Sit>stands from w/c to R HHA from +2 assist with min assist for lifting/balance from thearpist -  continues to benefit from cuing to scoot towards front of seat and ensure adequate anterior trunk lean to rise more independently - continues to have excessive L knee adduction while rising.   Gait training [id number] + [id number] R HHA from +2 assist and light mod assist from therapist for L LE management and balance as follows: - Donned functional electrical stimulation (FES) to L LE hamstrings during 2nd and 3rd gait trails as described below to promote increased hamstring activation during swing for improved foot clearance - pt continues to have mild L LOB during R stance phase requiring cuing to sustain longer R weight shift to allow her time to advance L LE - advances L LE 100% of the way during swing but toe drag and continued circumduction compensation; however, this improves slightly with FES - able to stabilize during stance with min guarding of knee buckle for safety and consistent guarding and verbal cuing to avoid hyperextension by stating to straighten knee "gently"  Utilized [place] E-stim device set to "NMES L" pre-set parameters with utilization of a trigger switch to targeting correct timing of the stimulation during swing phase of gait - required intensity of 40 to elicit a muscle contraction. Pt did report feeling some "tingling" in her L heel and L breast when using e-stim, but difficult to determine if it was associated. After e-stim removed no evidence of adverse events.   Transported back to room at end of session and pt left seated in w/c with needs in reach, L UE supported, meal tray set-up, and seat belt alarm on.  Therapy Documentation Precautions:  Precautions Precautions: Fall, Other (comment) Precaution Comments: L hemiplegia, L inattention, R  hip and knee pain Restrictions Weight Bearing Restrictions: No   Pain: Session 1: Reports increasing R knee pain with gait training due to prolonged WBing - provided seated rest breaks and distractions for pain management -  nurse notified at end of session for medication administration.  Session 2: Reports premedicated in preparation for therapy session. States she continues to have some R knee discomfort with prolonged standing/weightbearing benefiting from seated rest breaks for pain management    Therapy/Group: Individual Therapy  Tawana Scale , PT, DPT, NCS, CSRS 10/23/2021, 12:26 PM

## 2021-10-23 NOTE — Progress Notes (Signed)
Occupational Therapy Session Note  Patient Details  Name: Charlotte Hunter MRN: [id number] Date of Birth: 01/12/1956  Today's Date: 10/23/2021 OT Individual Time: 1415-1500 OT Individual Time Calculation (min): 45 min    Short Term Goals: Week 3:  OT Short Term Goal 1 (Week 3): STG= LTG   Skilled Therapeutic Interventions/Progress Updates:    Pt received supine with no c/o pain, agreeable to OT session but requesting to stay bed level for the session. Focus of session on LUE NMR with use of e-stim.  1:1 NMES applied to pt's bicep and to his anterior wrist to stimulate muscle activation. Pt with new finger and wrist flexion 2/5, and trace in her bicep! She had a great response to NMES and was able to complete AAROM in a gravity eliminated position.   Ratio 1:3 Rate 35 pps Waveform- Asymmetric Ramp 1.0 Pulse 300 Intensity- 7.5 wrist, 16 bicep  Duration - 20 min ( 10 wrist, 10 bicep)     Report of pain at the beginning of session 0/10 Report of pain at the end of session 0/10 No adverse reactions after treatment and is skin intact.  Pt left supine with all needs met, bed alarm set.    Therapy Documentation Precautions:  Precautions Precautions: Fall, Other (comment) Precaution Comments: L hemiplegia, L inattention, R hip and knee pain Restrictions Weight Bearing Restrictions: No   Therapy/Group: Individual Therapy  Curtis Sites 10/23/2021, 3:10 PM

## 2021-10-24 NOTE — Progress Notes (Signed)
Patient ID: Charlotte Hunter, female   DOB: 13-Jun-1955, 66 y.o.   MRN: [id number]  SNF resources provided to patient daughter again.

## 2021-10-24 NOTE — Progress Notes (Signed)
PROGRESS NOTE   Subjective/Complaints:  Daughter at bedside would like additional SNF recs  ROS:  Pt denies SOB, abd pain, CP, N/V/C/D, and vision changes   Objective:   No results found. No results for input(s): "WBC", "HGB", "HCT", "PLT" in the last 72 hours.  No results for input(s): "NA", "K", "CL", "CO2", "GLUCOSE", "BUN", "CREATININE", "CALCIUM" in the last 72 hours.   Intake/Output Summary (Last 24 hours) at 10/24/2021 0919 Last data filed at 10/24/2021 5537 Gross per 24 hour  Intake 596 ml  Output --  Net 596 ml         Physical Exam: Vital Signs Blood pressure (!) 123/58, pulse 68, temperature 98.1 F (36.7 C), resp. rate 18, height 5' 5.98" (1.676 m), weight 80.6 kg, SpO2 96 %.   General: No acute distress Mood and affect are appropriate Heart: Regular rate and rhythm no rubs murmurs or extra sounds Lungs: Clear to auscultation, breathing unlabored, no rales or wheezes Abdomen: Positive bowel sounds, soft nontender to palpation, nondistended Extremities: No clubbing, cyanosis, or edema   Skin: Clean and intact without signs of breakdown Neuro: Cranial nerves II through XII intact, motor strength is 5/5 in right and 0/5 left deltoid, bicep, tricep, trace finger flexors and pronators , hip flexor or, knee extensors, ankle dorsiflexor and plantar flexor.  2-left hip ext and add.  Sensory exam normal to light touch and proprioception in right and No LT sensation left upper and lower extremities.  Negative SLR, pain with Achilles stretching on left.  Musculoskeletal: FROM all 4 extremities. No joint swelling, tender right parasternal   Assessment/Plan: 1. Functional deficits which require 3+ hours per day of interdisciplinary therapy in a comprehensive inpatient rehab setting. Physiatrist is providing close team supervision and 24 hour management of active medical problems listed below. Physiatrist and  rehab team continue to assess barriers to discharge/monitor patient progress toward functional and medical goals  Care Tool:  Bathing    Body parts bathed by patient: Left arm, Chest, Abdomen, Front perineal area, Right upper leg, Left upper leg, Face   Body parts bathed by helper: Right arm, Buttocks, Right lower leg, Left lower leg     Bathing assist Assist Level: Minimal Assistance - Patient > 75%     Upper Body Dressing/Undressing Upper body dressing   What is the patient wearing?: Pull over shirt    Upper body assist Assist Level: Minimal Assistance - Patient > 75%    Lower Body Dressing/Undressing Lower body dressing      What is the patient wearing?: Pants     Lower body assist Assist for lower body dressing: Moderate Assistance - Patient 50 - 74%     Toileting Toileting Toileting Activity did not occur Landscape architect and hygiene only): N/A (no void or bm)  Toileting assist Assist for toileting: Maximal Assistance - Patient 25 - 49%     Transfers Chair/bed transfer  Transfers assist  Chair/bed transfer activity did not occur: Safety/medical concerns  Chair/bed transfer assist level: Minimal Assistance - Patient > 75%     Locomotion Ambulation   Ambulation assist   Ambulation activity did not occur: Safety/medical concerns  Assist level: 2  helpers (+2 R HHA & mod A) Assistive device: Hand held assist Max distance: 73f   Walk 10 feet activity   Assist  Walk 10 feet activity did not occur: Safety/medical concerns  Assist level: 2 helpers Assistive device: Other (comment) (rail in hallway)   Walk 50 feet activity   Assist Walk 50 feet with 2 turns activity did not occur: Safety/medical concerns         Walk 150 feet activity   Assist Walk 150 feet activity did not occur: Safety/medical concerns         Walk 10 feet on uneven surface  activity   Assist Walk 10 feet on uneven surfaces activity did not occur:  Safety/medical concerns         Wheelchair     Assist Is the patient using a wheelchair?: Yes Type of Wheelchair: Manual (TIS) Wheelchair activity did not occur: Safety/medical concerns         Wheelchair 50 feet with 2 turns activity    Assist    Wheelchair 50 feet with 2 turns activity did not occur: Safety/medical concerns       Wheelchair 150 feet activity     Assist  Wheelchair 150 feet activity did not occur: Safety/medical concerns       Blood pressure (!) 123/58, pulse 68, temperature 98.1 F (36.7 C), resp. rate 18, height 5' 5.98" (1.676 m), weight 80.6 kg, SpO2 96 %.  Medical Problem List and Plan: 1. 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 Formulized individual treatment plan based on medical history, underlying problem and comorbidities.     Con't CIR- PT, OT and SLP 2.  Antithrombotics: -DVT/anticoagulation:  Mechanical: Antiembolism stockings, thigh (TED hose) Bilateral lower extremities             -antiplatelet therapy: N/A 3. Pain Management: Tylenol as needed, has burning discomfort on Left side , discomfort LLE likely due to Thalamic stroke, Fibromyalgia add low dose duloxetine  duloxetine 20 mg will increase gabapentin to 2052mTID 7/10- will reduce gabapentin to 100 mg TID by insistence of pt.  4. Mood/Sleep: Provide emotional support, pt frustrated with dependant role will stop buspirone and monitor              -antipsychotic agents: N/A 5. Neuropsych/cognition: This patient is capable of making decisions on her own behalf. 6. Skin/Wound Care: Routine skin checks 7. Fluids/Electrolytes/Nutrition: Routine in and outs with follow-up chemistries 8.  Dysphagia.   Dysphagia #3 thin liquids -on regular diet/thin 9.  Hypertension.  Cozaar 50 mg daily, Lopressor 25 mg twice daily.  Monitor with increased mobility Vitals:   10/23/21 1932 10/24/21 0617  BP: (!) 142/63 (!) 123/58  Pulse: 69 68  Resp: 18 18  Temp: 97.7 F (36.5 C) 98.1 F (36.7 C)  SpO2: 98% 96%   Controlled some borderline orthostatic changes related to FLomax 0.8 mg have reduced to 0.71m98m7/7 well controlled  7/10- will change AM Metoprolol to 0700 so can make sure gets by 8am 10.  PAF.  s/p ablation no need to restart Eliquis as per Dr  [facility]  11.  Obesity.  BMI  29.21.  Dietary follow-up 12.  History of recurrent UTI.  Prophylactic Macrodantin 50 mg daily.resume once pt done  Urinary retention resolved   13.  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  Lethargic with poor sitting balance yesterday , repeat CT head unchanged  15.  Constipation ,resolved change miralax to PRN (was BID)  16.  Hot flashes, many years post menopausal, , gabapentin may help but could require a higher dose   Improved on premarin  17.  Episode of CPx 1 resolved, exam has tenderness RIght parasternal, EKG and troponin negative, likely MSK      LOS: 23 days A FACE TO FACE EVALUATION WAS PERFORMED  Charlett Blake 10/24/2021, 9:19 AM

## 2021-10-24 NOTE — Progress Notes (Addendum)
Patient ID: Charlotte Hunter, female   DOB: [birth year], 66 y.o.   MRN: [id number]  -Patient SNF referral resubmitted on Hub  -Patient referral sent to [facility] per daughter request.  [phone]  Daughter requesting SNF referral to be resubmitted to [facility] and [facility].  7/14: SW left VM for [facility] to follow up on patient referral. 7/14: Sw received follow up for [facility]. Facility currently unable to offer a bed.

## 2021-10-24 NOTE — Progress Notes (Signed)
Physical Therapy Session Note  Patient Details  Name: Charlotte Hunter MRN: [id number] Date of Birth: 04-May-1955  Today's Date: 10/24/2021 PT Individual Time: 1305-1407 and 7106-2694 PT Individual Time Calculation (min): 62 min and 64 min   Short Term Goals: Week 4:  PT Short Term Goal 1 (Week 4): = to LTGs based on ELOS  Skilled Therapeutic Interventions/Progress Updates:    Session 1: Pt received sitting in w/c with her daughter present and pt agreeable to therapy session. Sitting in w/c, donned L LE Matrix Max GRAFO (swapped out from the [place] Max GRAFO to one that is less supportive due to pt having increased quad activation and starting to have intermittent knee hyperextension during stance). Discussed D/C plan - discussed plan to wait for AFO consult until pt is consistently ambulatory and planning to be ambulatory with family assistance at home. Pt's daughter with questions regarding D/C plan and therapist reached out to SW to discuss with her. Transported to/from gym in w/c for time management and energy conservation.    Sit>stand from w/c or EOM to R HHA from +2 assist or using RW with varying levels of min assist throughout session - continues to have excessive L LE knee adduction and internal rotation during sit<>stand with mild decreased L LE WBing although improving.  Gait training 10ft using R HHA from +2 assist with mod assist from therapist for balance and L LE gait mechanics as follows:  - continues to have L lateral trunk lean - continues to be unable to activate L knee flexion during swing resulting in toe drag and slight circumduction - able to stabilize L LE during stance with min guarding for safety and frequent blocking of knee hyperextension   Therapist provided pt with RW and L hand orthosis - educated pt on proper use of orthosis.  Gait training 43ft + 74ft using RW with heavy mod assist of 1 for balance and AD management due to pt frequently pulling AD too far  towards the R  - continues to have similar gait deviations as noted above; however, now with even longer R LE stance time which results in increased R hip pain and shorter gait distance.  Transported back to room.  R partial stand pivot W/C>EOB using R UE support on bedrail with heavy min assist for balance and step-by-step cuing for L LE sequencing to increase WBing for NMR.  Sit>supine heavy min assist for L hemibody management. Pt left supine in bed with needs in reach, pillows positioned to support L hemibody, L LE PRAFO donned to improve ankle positioning, bed alarm on, and her daughter present.    Session 2: Pt received supine in bed and agreeable to therapy session. Pt still reporting R hip and R lower back pain from standing and gait training due to pt compensating for L hemibody paresis by increased R LE weightbearing and muscle activation. Rolled supine>L sidelying in bed with min assist for L hemibody management. Performed R lower lumbar paraspinal and R hip musculature soft tissue mobilization with manual trigger point release for - noticed pt with 2 different palpable trigger points one in medial glute med and max region and one near piriformis. Pt reports an improvement in her pain after manual therapy. Transitioned to sitting EOB with heavy min assist for L hemibody management and using bedrail. Sitting EOB donned B LE shoes and L LE Matrix Max GRAFO total assist for time management. L squat pivot transfer EOB>w/c with min assist for L UE management  and balance with mod cuing for sequencing and set-up.  Transported to/from gym in w/c for time management and energy conservation. Sit>stand w/c>R HHA from +2 assist with varying levels of min assist during session - continues to benefit from cuing to scoot hips forward in chair and ensure adequate anterior trunk lean.   Applied L LE functional electrical stimulation (FES) to L hamstring muscles during gait training using [place]  e-stim device set on NMES L with trigger switch to time activation of stimulation during swing phase of gait - intensity 46/47 with palpable muscle contraction - pt denies any negative side effects with skin intact and no adverse side effects noted. Gait training 71ft x2 using R HHA from +2 assist to promote longer gait distance and improved gait mechanics with mod assist from therapist for balance and L LE management as follows:  - pt continues to have L lateral trunk lean requiring cuing to return to midline - continues to be able to advance L LE 90-100% of the way during swing phase although still has some excessive hip adduction and internal rotation - continues to stabilize during stance phase with max cuing for knee extension with only intermittent min guarding of buckling and more consistent guarding to prevent hyperextension  - notice slight improvement in L knee flexion during swing with definite decrease in amount of circumduction noted while using FES  At end of session, pt transported back to her room and left seated in w/c in the care of NT.  Therapy Documentation Precautions:  Precautions Precautions: Fall, Other (comment) Precaution Comments: L hemiplegia, L inattention, R hip and knee pain Restrictions Weight Bearing Restrictions: No  Pain:  Session 1: Continues to have R hip pain with increased time in standing/gait training - provide frequent seated rest breaks, distractions, and premedicated.  Session 2: Continues to have R hip pain - addressed with medication and manual therapy described above. During gait training reports R knee "crunching" pain - provided seated rest break for pain management and pt premedicated.    Therapy/Group: Individual Therapy  Ginny Forth , PT, DPT, NCS, CSRS 10/24/2021, 12:14 PM

## 2021-10-24 NOTE — Progress Notes (Signed)
Occupational Therapy Session Note  Patient Details  Name: Charlotte Hunter MRN: [id number] Date of Birth: 1955/05/06  Today's Date: 10/24/2021 OT Individual Time: 1015-1130 OT Individual Time Calculation (min): 75 min    Short Term Goals: Week 3:  OT Short Term Goal 1 (Week 3): STG= LTG  Skilled Therapeutic Interventions/Progress Updates:    Patient agreeable to participate in OT session. Reports 0/10 pain level. Daughter present during shower to observe patient's functional performance. Rehab tech available during use of Stedy to assist mobility.  Patient participated in skilled OT session focusing on ADL re-training, hemi dressing techniques, and activity tolerance. Therapist facilitated session with patient participating in shower seated on tub bench. Stedy used to complete functional transfer with Mod A provided to achieve sit to stand. Pt required physical assist to manage LUE on Stedy bar. Pt was able to maintain sitting balance with SBA during shower. Therapist educated patient on safety awareness, compensatory techniques, and hemi dressing techniques during self care tasks in order to promote improved functional performance during basic ADL tasks. Min A was provided for UB bathing, Mod A provided for LB bathing. Max assist provide for UB dressing and LB dressing. Min A needed for grooming. VC for form and technique provided overall.     Therapy Documentation Precautions:  Precautions Precautions: Fall, Other (comment) Precaution Comments: L hemiplegia, L inattention, R hip and knee pain Restrictions Weight Bearing Restrictions: No   Therapy/Group: Individual Therapy  Limmie Patricia, OTR/L,CBIS  Supplemental OT - [facility] and [facility]  10/24/2021, 4:09 PM

## 2021-10-24 NOTE — Progress Notes (Signed)
Patient ID: Charlotte Hunter, female   DOB: March 11, 1956, 66 y.o.   MRN: [id number]  Team Conference Report to Patient/Family  Team Conference discussion was reviewed with the patient and caregiver, including goals, any changes in plan of care and target discharge date.  Patient and caregiver express understanding and are in agreement.  The patient has a target discharge date of  (snf pending).  SW met with patient daughter to discuss SNF options. Sw will continue to follow up with patient daughter on placement.   Dyanne Iha 10/24/2021, 2:19 PM

## 2021-10-24 NOTE — Patient Care Conference (Signed)
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 Claudette Laws Social Worker Present: Lavera Guise, BSW Nurse Present: Chana Bode, RN PT Present: Casimiro Needle, PT OT Present: Annye English, OT SLP Present: Eilene Ghazi, SLP PPS Coordinator present : Edson Snowball, PT     Current Status/Progress Goal Weekly Team Focus  Bowel/Bladder   Pt is continent of bowel/bladder  Pt will remain continent of bowel/bladder  Will assess qshift and PRN   Swallow/Nutrition/ Hydration             ADL's   Min assist for bathing in shower 07/10, Min assist upper body dressing, mod assist LB bathing, Max assist toileting  downgraded to mostly MIN A  BADL Reeducation, transfer training, balance training, functional mobility, LUE NMR, Problem identification, problem solving   Mobility   min/mod assist supine<>sit, min/mod assist sit<>stand and squat pivot transfers, mod assist stand pivot transfers, gait up to 84ft using R HHA from +2 assist and light mod assist from therapist for balance and LLE management (advances 90-100% during swing with excess adduction and internal rotation; stabilizes during stance with only min assist/guarding wearing GRAFO) - now has active movement in L LE in open chain  min assist overall  activity tolerance,  pt/family education, bed mobility training, transfer training, L hemibody NMR, dynamic sitting balance, dynamic standing balance, dynamic gait training   Communication             Safety/Cognition/ Behavioral Observations            Pain   Pt's pain is controlled with medication  Pt's pain will become managable without medication  Will assess qshift and PRN   Skin               Discharge Planning:  Discharge to SNF   Team Discussion: Patient with no sensation of left LE and light strength return in left lower extremity. Continent of bowel and bladder. Needs prns for pain management. Left inattention remains.  Patient on target to meet rehab goals: Currently needs min assist for bating in the shower and max assist for toileting. Needs mi - mod assist for bed mobility , sit - stand and squat pivots. Completes stand pivots with min assist and able to stabilize knee and advance leg even with tone. Able to ambulate up to 66' with HHA and mod assist.  *See Care Plan and progress notes for long and short-term goals.   Revisions to Treatment Plan:  AFO consult pending Downgraded PT goals SLP services discontinued   Teaching Needs: Safety, medications, dietary modifications, toileting, etc  Current Barriers to Discharge: Home enviroment access/layout and Lack of/limited family support  Possible Resolutions to Barriers: SNF recommended     Medical Summary Current Status: improving distal LUE strength still insensate, LLE increased strength  Barriers to Discharge:  Medical stability   Possible Resolutions to Barriers/Weekly Focus: cont NM re education   Continued Need for Acute Rehabilitation Level of Care: The patient requires daily medical management by a physician with specialized training in physical medicine and rehabilitation for the following reasons: Direction of a multidisciplinary physical rehabilitation program to maximize functional independence : Yes Medical management  of patient stability for increased activity during participation in an intensive rehabilitation regime.: Yes Analysis of laboratory values and/or radiology reports with any subsequent need for medication adjustment and/or medical intervention. : Yes   I attest that I was present, lead the team conference, and concur with the assessment and plan of the team.   Chana Bode B 10/24/2021, 3:40 PM

## 2021-10-24 NOTE — Progress Notes (Signed)
Physical Therapy Session Note  Patient Details  Name: Charlotte Hunter MRN: [id number] Date of Birth: 14-Oct-1955  Today's Date: 10/24/2021 PT Individual Time: 0800-0840 PT Individual Time Calculation (min): 40 min   Short Term Goals: Week 4:  PT Short Term Goal 1 (Week 4): = to LTGs based on ELOS  Skilled Therapeutic Interventions/Progress Updates:  Pt received supine in bed and agreeable to PT. Supine>sit transfer with min assist at the LLE with cues for attention to the LUE. Donning socks and shoes EOB with supervision assist for balance and total A for clothing management. .  Sit<>stand with min-mod assist and LLE blocked with RUE supported on bed rail. PT pull pants to waist with total A for clothing management and min assist for balance.  Stand pivot transfer with WC with mod assist for improved coordination of the LLE.  Sit<>stand in parallel bars X 5 WITH min assist from PT to improve L hip position to prevent adduction bias. Lateral reach to the L with LUE to grap hand rail at target with max assist for engagement of finger extensors. Noted trace elbow flexion and adduction to return to midline.   Pregait stepping in parallel bars with RUE support. Reciprocal stepping R and L x 10 with min assist from PT for improved weight shift R and L to allow improve hip.knee flexion to advance the LLE. Then performed blocked stepping R and L   WC mobility with RUE/RLE propulsion to room with supervision assist with cues for coordination of the RUE/RLE to prevent veer to the R.   Patient returned to room and left sitting in [facility] with call bell in reach and all needs met.          Therapy Documentation Precautions:  Precautions Precautions: Fall, Other (comment) Precaution Comments: L hemiplegia, L inattention, R hip and knee pain Restrictions Weight Bearing Restrictions: No  Vital Signs: Therapy Vitals Temp: 98.1 F (36.7 C) Pulse Rate: 68 Resp: 18 BP: (!) 123/58 Patient Position  (if appropriate): Lying Oxygen Therapy SpO2: 96 % O2 Device: Room Air Pain: denies    Therapy/Group: Individual Therapy  Lorie Phenix 10/24/2021, 9:38 AM

## 2021-10-25 NOTE — Progress Notes (Signed)
PROGRESS NOTE   Subjective/Complaints:  Daughter was looking at retirement communities and ALF , pt appropriate for SNF   ROS:  Pt denies SOB, abd pain, CP, N/V/C/D, and vision changes   Objective:   No results found. No results for input(s): "WBC", "HGB", "HCT", "PLT" in the last 72 hours.  No results for input(s): "NA", "K", "CL", "CO2", "GLUCOSE", "BUN", "CREATININE", "CALCIUM" in the last 72 hours.   Intake/Output Summary (Last 24 hours) at 10/25/2021 3244 Last data filed at 10/24/2021 1819 Gross per 24 hour  Intake 480 ml  Output --  Net 480 ml         Physical Exam: Vital Signs Blood pressure (!) 110/48, pulse 66, temperature 98.1 F (36.7 C), resp. rate 17, height 5' 5.98" (1.676 m), weight 80.6 kg, SpO2 91 %.   General: No acute distress Mood and affect are appropriate Heart: Regular rate and rhythm no rubs murmurs or extra sounds Lungs: Clear to auscultation, breathing unlabored, no rales or wheezes Abdomen: Positive bowel sounds, soft nontender to palpation, nondistended Extremities: No clubbing, cyanosis, or edema   Skin: Clean and intact without signs of breakdown Neuro: Cranial nerves II through XII intact, motor strength is 5/5 in right and 0/5 left deltoid, bicep, tricep,2-finger flexors and pronators , hip flexor or, knee extensors, ankle dorsiflexor and plantar flexor.  2-left hip ext and add.  Sensory exam normal to light touch and proprioception in right and No LT sensation left upper and lower extremities.  Negative SLR, pain with Achilles stretching on left.  Musculoskeletal: FROM all 4 extremities. No joint swelling, tender right parasternal   Assessment/Plan: 1. Functional deficits which require 3+ hours per day of interdisciplinary therapy in a comprehensive inpatient rehab setting. Physiatrist is providing close team supervision and 24 hour management of active medical problems listed  below. Physiatrist and rehab team continue to assess barriers to discharge/monitor patient progress toward functional and medical goals  Care Tool:  Bathing    Body parts bathed by patient: Left arm, Chest, Abdomen, Front perineal area, Right upper leg, Left upper leg, Face   Body parts bathed by helper: Right arm, Buttocks, Right lower leg, Left lower leg     Bathing assist Assist Level: Minimal Assistance - Patient > 75%     Upper Body Dressing/Undressing Upper body dressing   What is the patient wearing?: Pull over shirt    Upper body assist Assist Level: Minimal Assistance - Patient > 75%    Lower Body Dressing/Undressing Lower body dressing      What is the patient wearing?: Pants     Lower body assist Assist for lower body dressing: Moderate Assistance - Patient 50 - 74%     Toileting Toileting Toileting Activity did not occur Press photographer and hygiene only): N/A (no void or bm)  Toileting assist Assist for toileting: Maximal Assistance - Patient 25 - 49%     Transfers Chair/bed transfer  Transfers assist  Chair/bed transfer activity did not occur: Safety/medical concerns  Chair/bed transfer assist level: Minimal Assistance - Patient > 75%     Locomotion Ambulation   Ambulation assist   Ambulation activity did not occur: Safety/medical concerns  Assist level: 2 helpers Assistive device: Hand held assist Max distance: 70ft   Walk 10 feet activity   Assist  Walk 10 feet activity did not occur: Safety/medical concerns  Assist level: 2 helpers Assistive device: Other (comment) (rail in hallway)   Walk 50 feet activity   Assist Walk 50 feet with 2 turns activity did not occur: Safety/medical concerns         Walk 150 feet activity   Assist Walk 150 feet activity did not occur: Safety/medical concerns         Walk 10 feet on uneven surface  activity   Assist Walk 10 feet on uneven surfaces activity did not occur:  Safety/medical concerns         Wheelchair     Assist Is the patient using a wheelchair?: Yes Type of Wheelchair: Manual (TIS) Wheelchair activity did not occur: Safety/medical concerns         Wheelchair 50 feet with 2 turns activity    Assist    Wheelchair 50 feet with 2 turns activity did not occur: Safety/medical concerns       Wheelchair 150 feet activity     Assist  Wheelchair 150 feet activity did not occur: Safety/medical concerns       Blood pressure (!) 110/48, pulse 66, temperature 98.1 F (36.7 C), resp. rate 17, height 5' 5.98" (1.676 m), weight 80.6 kg, SpO2 91 %.  Medical Problem List and Plan: 1. 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  Antithrombotics: -DVT/anticoagulation:  Mechanical: Antiembolism stockings, thigh (TED hose) Bilateral lower extremities             -antiplatelet therapy: N/A 3. Pain Management: Tylenol as needed, has burning discomfort on Left side , discomfort LLE likely due to Thalamic stroke, Fibromyalgia add low dose duloxetine  duloxetine 20 mg will increase gabapentin to 200mg  TID 7/10- will reduce gabapentin to 100 mg TID by insistence of pt.  4. Mood/Sleep: Provide emotional support, pt frustrated with dependant role will stop buspirone and monitor              -antipsychotic agents: N/A 5. Neuropsych/cognition: This patient is capable of making decisions on her own behalf. 6. Skin/Wound Care: Routine skin checks 7. Fluids/Electrolytes/Nutrition: Routine in and outs with follow-up chemistries 8.  Dysphagia.  Dysphagia #3 thin liquids -on regular diet/thin 9.  Hypertension.  Cozaar 50 mg daily, Lopressor 25 mg twice daily.  Monitor with increased mobility Vitals:   10/24/21  1930 10/25/21 0352  BP: 135/70 (!) 110/48  Pulse: 72 66  Resp: 17 17  Temp: 98.1 F (36.7 C) 98.1 F (36.7 C)  SpO2: 94% 91%   Controlled some borderline orthostatic changes related to FLomax 0.8 mg have reduced to 0.4mg   7/7 well controlled  7/10- will change AM Metoprolol to 0700 so can make sure gets by 8am 10.  PAF.  s/p ablation no need to restart Eliquis as per Dr  10/27/21, cardiology  11.  Obesity.  BMI 29.21.  Dietary follow-up 12.  History of recurrent UTI.  Prophylactic Macrodantin 50 mg daily. 13.  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  Lethargic with poor sitting balance yesterday , repeat CT head unchanged  15.  Constipation ,resolved change miralax to PRN (was BID)  16.  Hot flashes, many years post menopausal, , gabapentin may help but could require a higher dose   Improved on premarin  17.  Episode of CPx 1 resolved, exam has tenderness RIght parasternal, EKG and troponin negative, likely MSK      LOS: 24 days A FACE TO FACE EVALUATION WAS PERFORMED  Charlett Blake 10/25/2021, 8:22 AM

## 2021-10-25 NOTE — Progress Notes (Signed)
Occupational Therapy Session Note  Patient Details  Name: Charlotte Hunter MRN: [id number] Date of Birth: 06-18-1955  Today's Date: 10/25/2021 OT Individual Time: 5852-7782 OT Individual Time Calculation (min): 72 min    Short Term Goals: Week 3:  OT Short Term Goal 1 (Week 3): STG= LTG  Skilled Therapeutic Interventions/Progress Updates:  Pt greeted on toilet with NT pt able to stand to stedy with MIN A while NT completed 3/3 toileting tasks with MAX A. Total A transport into shower in stedy where pt completed bathing from shower seat with overall  MIN A able to wash her leg minimally with LUE with trace digit mobility in 1st and 2nd fingers! Pt continues to assist with washing her RUE and lower legs.  Used stedy to transfer out of shower with pt able to stand to stedy with MINA.  Pt completed dressing from w/c with MIN A for UB dressing, needing MIN cues to recall hemi technique and MOD A to don pants and brief via sit>stand at sink. Pt completed oral care at sink from w/c with MIN A to set- up brush and needed assist to blow dry her hair while OTA help blow dryer and pt used RUE to comb through hair. Pt left up in w/c with visitors present and all needs within reach.   Therapy Documentation Precautions:  Precautions Precautions: Fall, Other (comment) Precaution Comments: L hemiplegia, L inattention, R hip and knee pain Restrictions Weight Bearing Restrictions: No   Pain: no pain reported during session     Therapy/Group: Individual Therapy  Barron Schmid 10/25/2021, 12:17 PM

## 2021-10-25 NOTE — Progress Notes (Signed)
Physical Therapy Session Note  Patient Details  Name: Charlotte Hunter MRN: [id number] Date of Birth: 07-09-1955  Today's Date: 10/25/2021 PT Individual Time: 5643-3295 and 1884-1660 PT Individual Time Calculation (min): 70 min and 46 min  Short Term Goals: Week 4:  PT Short Term Goal 1 (Week 4): = to LTGs based on ELOS  Skilled Therapeutic Interventions/Progress Updates:    Session 1: Pt received supine in bed and agreeable to therapy session. Supine>sitting R EOB, HOB partially elevated and using bedrail, with min assist for L UE and LE management - pt is starting to be able to differentiate L LE hip/knee flexion vs extension but is much better at motor planning and activating quads compared to hamstrings or hip flexors.  Sitting EOB, with supervision sitting balance: donned shorts, shoes with L LE Matrix Max GRAFO, and long sleeve top with max/total assist for time management   Sit>stand EOB to R UE support on bedrail with min assist of 1 for lifting and then min assist for balance while pt pulled pants up over hips with min/mod assist for L side.   L stand pivot transfer EOB>w/c with heavy min/light mod assist of 1 for balance and step-by-step cuing for sequencing steps and max cuing to ensure L knee extended to step back with R LE, facilitation for correct stepping direction of L LE.  Transported to/from gym in w/c for time management and energy conservation.  Applied L LE functional electrical stimulation (FES) to L hamstring muscles during gait training using [place] e-stim device set on NMES L with trigger switch to time activation of stimulation during swing phase of gait - intensity 43 with palpable muscle contraction - pt denies any negative side effects with skin intact and no adverse side effects noted.  Gait training 11ft x2 using R HHA from +2 assist with mod assist for balance and L LE gait mechanics as follows:  - continues to have L lateral trunk lean with poor R weight  shift onto R stance limb as well as poor midline awareness - advances L LE 100% of the way during swing but with occasional excessive hip adduction and internal rotation requiring cuing to correct - requires consistent verbal cuing to activate L quads during stance for stability with only min guarding for safety, occasional blocking of l knee hyperextension - therapist facilitating at pt's pelvis for L anterior pelvic weight shift over L stance phase while cuing to maintain upright posture  Gait training additional 84ft using RW for initial 59ft then transitioned to R HHA because pt has worsening L lateral lean/pushing with reports of R hip feeling "tired" and becoming painful - notice when using RW pt with increased feeling of instability and onset of pushing/heavy leaning towards L due to impaired midline orientation - would benefit from mirror feedback in next session.  Transported back to room and left seated in w/c in the care of NT.  Session 2: Pt received supine in bed asleep but easily awakens and agreeable to therapy session. Supine>sitting R EOB, HOB partially elevated and using bedrail with min assist for L hemibody management. Sitting EOB, donned tennis shoes and L LE Matrix Max GRAFO total assist. L stand pivot transfer EOB>w/c using R UE support on bedrail with mod assist of 1 for balance and L LE management - step-by-step cuing for sequencing and L LE knee extensor activation during stance with L weight shift to allow R LE stepping backwards.  Transported to/from gym in w/c for time management  and energy conservation.  Sit>stand from w/c>RW with heavy min assist of 1 for lifting to stand and manual facilitation for improved L LE alignment and therapist managing L UE.  Gait training ~57ft using RW with mod assist of 1 and +2 providing mirror feedback to address strong L lateral trunk lean - pt reports greater understanding of midline and able to see herself leaning but continues to have  difficulty motor planning how to correct this - +2 provided w/c for seated break to then transition to NMR.  Standing L LE NMR focusing on improved stance control with mirror feedback and mod/max assist of 1 for balance with +2 supervision for safety during R LE foot taps to/from 4" step with only intermittent R UE support on thearpist - therapist blocking/guarding L knee for safety and providing step-by-step cuing for glute/quad activation and sustained contraction with L weight shift without LOB.   Pt reporting significant fatigue and requesting to return to bed at end of session. R stand pivot w/c>EOB using R UE support on bedrail with light mod assist for balance and step-by-step cuing for sequencing. Sit>supine with heavy min assist for L hemibody management. Pt left supine in bed with needs in reach, pillows positioned to support L hemibody, and bed alarm on.    Therapy Documentation Precautions:  Precautions Precautions: Fall, Other (comment) Precaution Comments: L hemiplegia, L inattention, R hip and knee pain Restrictions Weight Bearing Restrictions: No   Pain:  Session 1: Reports some soreness from manual therapy yesterday but overall reports she felt that it has helped her pain. She continues to have R hip pain with prolonged standing/gait training - provide seated rest breaks, repositioning, and distractions for pain management.  Session 2: Continues to have R hip pain with prolonged standing and gait training - premedicated and provided repositioning, seated rest breaks, and distractions for pain management throughout session.    Therapy/Group: Individual Therapy  Ginny Forth , PT, DPT, NCS, CSRS 10/25/2021, 7:55 AM

## 2021-10-26 NOTE — Progress Notes (Signed)
Physical Therapy Session Note  Patient Details  Name: Charlotte Hunter MRN: [id number] Date of Birth: [birth year]  Today's Date: 10/26/2021 PT Individual Time: 2706-2376 and 2831-5176  PT Individual Time Calculation (min): 60 min and 41 min   Short Term Goals: Week 1:  PT Short Term Goal 1 (Week 1): Pt will perform supine<>sit with mod assist PT Short Term Goal 1 - Progress (Week 1): Progressing toward goal PT Short Term Goal 2 (Week 1): Pt will perform sit<>stands using LRAD with max assist PT Short Term Goal 2 - Progress (Week 1): Met PT Short Term Goal 3 (Week 1): Pt will perform bed<>chair transfers using LRAD with max assist PT Short Term Goal 3 - Progress (Week 1): Met PT Short Term Goal 4 (Week 1): Pt will participate in gait training of 80ft using +2 assist and lift equipment as necessary to ensure pt safety PT Short Term Goal 4 - Progress (Week 1): Met Week 2:  PT Short Term Goal 1 (Week 2): Pt will perform supine<>sit with mod assist of 1 consistently PT Short Term Goal 1 - Progress (Week 2): Met PT Short Term Goal 2 (Week 2): Pt will perform sit<>stands using LRAD with mod assist of 1 PT Short Term Goal 2 - Progress (Week 2): Met PT Short Term Goal 3 (Week 2): Pt will perform bed<>chair transfers  using LRAD with mod assist of 1 PT Short Term Goal 3 - Progress (Week 2): Met PT Short Term Goal 4 (Week 2): Pt will ambulate at least 29ft using LRAD with +2 mod assist PT Short Term Goal 4 - Progress (Week 2): Met Week 3:  PT Short Term Goal 1 (Week 3): Pt will perform supine<>sit with min assist of 1 using bedrail PT Short Term Goal 1 - Progress (Week 3): Progressing toward goal PT Short Term Goal 2 (Week 3): Pt will perform sit<>stand transfers with min assist of 1 PT Short Term Goal 2 - Progress (Week 3): Progressing toward goal PT Short Term Goal 3 (Week 3): Pt will perform bed<>chair transfers with min assist of 1 PT Short Term Goal 3 - Progress (Week 3): Progressing toward  goal PT Short Term Goal 4 (Week 3): Pt wil ambulate at least 58ft using LRAD with mod assist of 1 and +2 for safety PT Short Term Goal 4 - Progress (Week 3): Met PT Short Term Goal 5 (Week 3): Pt will perform at least 79ft of wheelchair mobility with min assist PT Short Term Goal 5 - Progress (Week 3): Progressing toward goal Week 4:  PT Short Term Goal 1 (Week 4): = to LTGs based on ELOS Week 5:     Skilled Therapeutic Interventions/Progress Updates:    Session 1   Pt received supine in bed and agreeable to PT. Supine>sit transfer with min assist and cues for attention to the [place].   Donning shoes while sitting EOB with total A for management of shoes and supervision assist for balance.   Stand pivot transfer with RUE supported on bed rail and min assist for step width on the LLE.   WC mobility   Gait training with RW.   Oral, facial and hair hygiene at sink.   Patient returned to room and left sitting in [facility] with call bell in reach and all needs met.    Session 2.   Pt received supine in bed and agreeable to PT. Supine>sit transfer with *** assist and ***cues   Sitting balance. To don shoes.  Stand pivot transfer.   WC mobility with [employer] through hall and over cement sidewalk at entrance of [facility].   Patient returned to room and left sitting in [facility] with call bell in reach and all needs met.          Therapy Documentation Precautions:  Precautions Precautions: Fall, Other (comment) Precaution Comments: L hemiplegia, L inattention, R hip and knee pain Restrictions Weight Bearing Restrictions: No General:   Vital Signs: Therapy Vitals Pulse Rate: 62 Resp: 18 BP: 133/64 Pain: Session 1.  Pain Assessment Pain Scale: 0-10 Pain Score: 5  Pain Type: Acute pain Pain Location: Hip Pain Orientation: Right Pain Onset: Gradual Patients Stated Pain Goal: 0 Pain Intervention(s): Medication (See eMAR) 2nd Pain Site Pain Score: 6 Pain Type: Chronic  pain Pain Location: Back Pain Orientation: Lower;Mid Pain Descriptors / Indicators: Aching Pain Frequency: Constant Pain Onset: On-going Patient's Stated Pain Goal: 0 Pain Intervention(s): Medication (See eMAR) Session2  Pain Assessment Pain Scale: 0-10 Pain Score: 4  Pain Type: Acute pain Pain Location: Hip Pain Orientation: Right Pain Descriptors / Indicators: Aching;Discomfort Pain Frequency: Intermittent Pain Onset: On-going Patients Stated Pain Goal: 0 Pain Intervention(s): Medication (See eMAR);Repositioned Mobility:   Locomotion :    Trunk/Postural Assessment :    Balance:   Exercises:   Other Treatments:      Therapy/Group: Individual Therapy  Lorie Phenix 10/26/2021, 9:50 AM

## 2021-10-26 NOTE — Progress Notes (Signed)
Patient ID: Charlotte Hunter, female   DOB: 15-Feb-1956, 66 y.o.   MRN: [id number]  SNF facilities that have approved patient and offered a bed have been provided to patient daughter.

## 2021-10-26 NOTE — Progress Notes (Signed)
Occupational Therapy Session Note  Patient Details  Name: Charlotte Hunter MRN: [id number] Date of Birth: June 07, 1955  Today's Date: 10/26/2021 OT Individual Time: 3846-6599 OT Individual Time Calculation (min): 36 min    Short Term Goals: Week 3:  OT Short Term Goal 1 (Week 3): STG= LTG  Skilled Therapeutic Interventions/Progress Updates:    Pt received semi-reclined in bed, reports L palm pain when touched due to parasthesias post CVA, but, agreeable to therapy. Session focus on activity tolerance, LUE NMR in prep for improved ADL/IADL/func mobility performance + decreased caregiver burden.  Discussed CVA risk factors and lifestyle modification to reduce risk of additional CVA.  In R side-lying, pt completed 1x10 L elbow flexion/extension in horizontal/vertical plane on table with use of towel. Pt able to activate biceps > triceps, but ultimately required max to total physical assist to complete due to fatigue. 1x10 soft sponge squeezes and flattening sponge with hand against table.  Educated on Librarian, academic and referred to [employer] for additional practice.  Pt left with HOB at 30 degrees with bed alarm engaged, call bell in reach, and all immediate needs met.    Therapy Documentation Precautions:  Precautions Precautions: Fall, Other (comment) Precaution Comments: L hemiplegia, L inattention, R hip and knee pain Restrictions Weight Bearing Restrictions: No  Pain:  Ongoing L sided paresthesias  ADL: See Care Tool for more details.   Therapy/Group: Individual Therapy  Volanda Napoleon MS, OTR/L  10/26/2021, 7:02 AM

## 2021-10-26 NOTE — Progress Notes (Signed)
Occupational Therapy Session Note  Patient Details  Name: Charlotte Hunter MRN: [id number] Date of Birth: [birth year]  Today's Date: 10/26/2021 OT Individual Time: 1045-1200 OT Individual Time Calculation (min): 75 min    Short Term Goals: Week 2:  OT Short Term Goal 1 (Week 2): pt will engage in one grooming tasks from w/c at sink with MOD A OT Short Term Goal 1 - Progress (Week 2): Met OT Short Term Goal 2 (Week 2): pt will completed 1/3 toileting tasks with MAXA OT Short Term Goal 2 - Progress (Week 2): Met OT Short Term Goal 3 (Week 2): pt will complete sit>stand with MOD A OT Short Term Goal 3 - Progress (Week 2): Met  Skilled Therapeutic Interventions/Progress Updates:   Upon OT arrival, pt seated in w/c and reports no pain. Pt agreeable to OT treatment session and requests to wash her hair. OT treatment intervention with a focus on self care retraining, functional transfers, ROM, and weightbearing. Pt doffed shirt with Mod A. Pt was setup at sink and requires Mod A to wash hair, and Mod A to blow dry hair. Increased time required. Pt donned shirt and bra with Mod A and increased time. Pt requires verbal cues for hemi dressing technique. Pt engages in ROM exercises to B UE simultaneously including shoulder elevation/depression and scapular protraction/retraction for 1x8 reps. Pt fatigued with exercises requiring rest breaks as needed. Weightbearing provided to the R UE to shoulder and elbow for 1x5 reps for each region. Pt completes stand pivot transfer from w/c to bed with Min A and completes sit to supine transfer with Mod A. Pt left in bed at end of session with all needs met.   Therapy Documentation Precautions:  Precautions Precautions: Fall, Other (comment) Precaution Comments: L hemiplegia, L inattention, R hip and knee pain Restrictions Weight Bearing Restrictions: No    Therapy/Group: Individual Therapy  Marvetta Gibbons 10/26/2021, 1:06 PM

## 2021-10-26 NOTE — Progress Notes (Signed)
PROGRESS NOTE   Subjective/Complaints: No problems in therapy , some leg swelling at end of day no pain in legs,  Some pain in LUE with elbow ROM ROS:  Pt denies SOB, abd pain, CP, N/V/C/D, and vision changes   Objective:   No results found. No results for input(s): "WBC", "HGB", "HCT", "PLT" in the last 72 hours.  No results for input(s): "NA", "K", "CL", "CO2", "GLUCOSE", "BUN", "CREATININE", "CALCIUM" in the last 72 hours.   Intake/Output Summary (Last 24 hours) at 10/26/2021 0810 Last data filed at 10/26/2021 0731 Gross per 24 hour  Intake 700 ml  Output --  Net 700 ml         Physical Exam: Vital Signs Blood pressure 133/64, pulse 62, temperature 97.8 F (36.6 C), resp. rate 18, height 5' 5.98" (1.676 m), weight 80.6 kg, SpO2 92 %.   General: No acute distress Mood and affect are appropriate Heart: Regular rate and rhythm no rubs murmurs or extra sounds Lungs: Clear to auscultation, breathing unlabored, no rales or wheezes Abdomen: Positive bowel sounds, soft nontender to palpation, nondistended Extremities: No clubbing, cyanosis, or edema Pain Left arm with elbow and shoulder ROM , no tenderness   Skin: Clean and intact without signs of breakdown Neuro: Cranial nerves II through XII intact, motor strength is 5/5 in right and 0/5 left deltoid, bicep, tricep,2-finger flexors and pronators , hip flexor or, knee extensors, ankle dorsiflexor and plantar flexor.  2-left hip ext and add.  Sensory exam normal to light touch and proprioception in right and No LT sensation left upper and lower extremities.  Negative SLR, pain with Achilles stretching on left.  Musculoskeletal: FROM all 4 extremities. No joint swelling, tender right parasternal   Assessment/Plan: 1. Functional deficits which require 3+ hours per day of interdisciplinary therapy in a comprehensive inpatient rehab setting. Physiatrist is providing close  team supervision and 24 hour management of active medical problems listed below. Physiatrist and rehab team continue to assess barriers to discharge/monitor patient progress toward functional and medical goals  Care Tool:  Bathing    Body parts bathed by patient: Left arm, Chest, Abdomen, Front perineal area, Right upper leg, Left upper leg, Face   Body parts bathed by helper: Right arm, Buttocks, Right lower leg, Left lower leg     Bathing assist Assist Level: Minimal Assistance - Patient > 75%     Upper Body Dressing/Undressing Upper body dressing   What is the patient wearing?: Pull over shirt    Upper body assist Assist Level: Minimal Assistance - Patient > 75%    Lower Body Dressing/Undressing Lower body dressing      What is the patient wearing?: Pants     Lower body assist Assist for lower body dressing: Moderate Assistance - Patient 50 - 74%     Toileting Toileting Toileting Activity did not occur Press photographer and hygiene only): N/A (no void or bm)  Toileting assist Assist for toileting: Maximal Assistance - Patient 25 - 49%     Transfers Chair/bed transfer  Transfers assist  Chair/bed transfer activity did not occur: Safety/medical concerns  Chair/bed transfer assist level: Minimal Assistance - Patient > 75%  Locomotion Ambulation   Ambulation assist   Ambulation activity did not occur: Safety/medical concerns  Assist level: 2 helpers (mod A of 1 and +2 R HHA min A) Assistive device: Hand held assist Max distance: 66ft   Walk 10 feet activity   Assist  Walk 10 feet activity did not occur: Safety/medical concerns  Assist level: 2 helpers Assistive device: Other (comment) (rail in hallway)   Walk 50 feet activity   Assist Walk 50 feet with 2 turns activity did not occur: Safety/medical concerns         Walk 150 feet activity   Assist Walk 150 feet activity did not occur: Safety/medical concerns         Walk 10 feet  on uneven surface  activity   Assist Walk 10 feet on uneven surfaces activity did not occur: Safety/medical concerns         Wheelchair     Assist Is the patient using a wheelchair?: Yes Type of Wheelchair: Manual (TIS) Wheelchair activity did not occur: Safety/medical concerns         Wheelchair 50 feet with 2 turns activity    Assist    Wheelchair 50 feet with 2 turns activity did not occur: Safety/medical concerns       Wheelchair 150 feet activity     Assist  Wheelchair 150 feet activity did not occur: Safety/medical concerns       Blood pressure 133/64, pulse 62, temperature 97.8 F (36.6 C), resp. rate 18, height 5' 5.98" (1.676 m), weight 80.6 kg, SpO2 92 %.  Medical Problem List and Plan: 1. 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  Antithrombotics: -DVT/anticoagulation:  Mechanical: Antiembolism stockings, knee hi  (TED hose) Bilateral lower extremities             -antiplatelet therapy: N/A 3. Pain Management: Tylenol as needed, has burning discomfort on Left side , discomfort LLE likely due to Thalamic stroke, Fibromyalgia add low dose duloxetine  duloxetine 20 mg will increase gabapentin to 200mg  TID 7/10- will reduce gabapentin to 100 mg TID by insistence of pt.  4. Mood/Sleep: Provide emotional support, pt frustrated with dependant role will stop buspirone and monitor              -antipsychotic agents: N/A 5. Neuropsych/cognition: This patient is capable of making decisions on her own behalf. 6. Skin/Wound Care: Routine skin checks 7. Fluids/Electrolytes/Nutrition: Routine in and outs with follow-up chemistries 8.  Dysphagia.  Dysphagia #3 thin liquids -on regular diet/thin 9.  Hypertension.  Cozaar  50 mg daily, Lopressor 25 mg twice daily.  Monitor with increased mobility Vitals:   10/26/21 0342 10/26/21 0808  BP: (!) 125/59 133/64  Pulse: 71 62  Resp: 18 18  Temp: 97.8 F (36.6 C)   SpO2: 92%    Controlled some borderline orthostatic changes related to FLomax 0.8 mg have reduced to 0.4mg   7/7 well controlled  7/10- will change AM Metoprolol to 0700 so can make sure gets by 8am 10.  PAF.  s/p ablation no need to restart Eliquis as per Dr  Casper Harrison, cardiology  11.  Obesity.  BMI 29.21.  Dietary follow-up 12.  History of recurrent UTI.  Prophylactic Macrodantin 50 mg daily. 13.  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  Lethargic with poor sitting balance yesterday , repeat CT head unchanged  15.  Constipation ,resolved change miralax to PRN (was BID)  16.  Hot flashes, many years post menopausal, , gabapentin may help but could require a higher dose   Improved on premarin  17.  Episode of CPx 1 resolved, exam has tenderness RIght parasternal, EKG and troponin negative, likely MSK      LOS: 25 days A FACE TO FACE EVALUATION WAS PERFORMED  Erick Colace 10/26/2021, 8:10 AM

## 2021-10-27 NOTE — Progress Notes (Signed)
Physical Therapy Session Note  Patient Details  Name: Doneta Bayman MRN: [id number] Date of Birth: Jul 12, 1955  Today's Date: 10/27/2021 PT Individual Time: 0910-0955 PT Individual Time Calculation (min): 45 min   Short Term Goals: Week 4:  PT Short Term Goal 1 (Week 4): = to LTGs based on ELOS  Skilled Therapeutic Interventions/Progress Updates:    Pt received supine in bed and agreeable to therapy session. Donned TED hose, shoes, and L LE Matrix Max GRAFO total assist for time management. Pt now able to initiate supine heel slide with L LE to achieve ~20degrees of knee flexion! Supine>sitting R EOB, HOB partially elevated and using bedrail, with min assist for L hemibody management. Sitting EOB, donned pull-over shirt with max assist for time management. Sit>stand EOB>R UE support on bedrail with min assist for lifting and balance. L stand pivot EOB>w/c using R UE bedrail support with heavy min assist for balance and L LE step - cuing for L step and pt demos improved ability to initiate L stance control with decreased cuing.  Transported to/from gym in w/c for time management and energy conservation.  Gait training 53ft x2 using RW with light mod assist of 1 for balance (progressively increased assist due to stronger L lean with fatigue) with +2 providing mirror feedback throughout with cuing as follows:   - wider L LE swing phase to avoid scissoring (aiming towards purple [place] on front L RW leg) - maintaining trunk upright posture - therapist facilitating increased L anterior pelvic rotation and forward progression over L stance limb - pt has improved L hip/knee extension for stance control with less cuing  *pt demos significantly less L lean/pushing today with the mirror feedback, when removing that pt returns to strong L lean/push due to poor midline orientation   At end of session, pt left seated in w/c at sink with nurse present to assist with hygiene.  Therapy  Documentation Precautions:  Precautions Precautions: Fall, Other (comment) Precaution Comments: L hemiplegia, L inattention, R hip and knee pain Restrictions Weight Bearing Restrictions: No   Pain:  Continues to report R hip pain with more prolonged WBing - provided seated rest breaks, premedicated, and distraction for pain management.    Therapy/Group: Individual Therapy  Ginny Forth , PT, DPT, NCS, CSRS 10/27/2021, 7:59 AM

## 2021-10-28 DIAGNOSIS — R300 Dysuria: Secondary | ICD-10-CM

## 2021-10-28 LAB — URINALYSIS, ROUTINE W REFLEX MICROSCOPIC
Bilirubin Urine: NEGATIVE
Glucose, UA: NEGATIVE mg/dL
Hgb urine dipstick: NEGATIVE
Ketones, ur: NEGATIVE mg/dL
Leukocytes,Ua: NEGATIVE
Nitrite: NEGATIVE
Protein, ur: NEGATIVE mg/dL
Specific Gravity, Urine: 1.012 (ref 1.005–1.030)
pH: 6 (ref 5.0–8.0)

## 2021-10-28 NOTE — Progress Notes (Signed)
PROGRESS NOTE   Subjective/Complaints: She reports she is having continued dysuria.  ROS:  Pt denies SOB, abd pain, CP, N/V/C/D, and vision changes + dysuria   Objective:   No results found. No results for input(s): "WBC", "HGB", "HCT", "PLT" in the last 72 hours.  No results for input(s): "NA", "K", "CL", "CO2", "GLUCOSE", "BUN", "CREATININE", "CALCIUM" in the last 72 hours.   Intake/Output Summary (Last 24 hours) at 10/28/2021 1500 Last data filed at 10/28/2021 1225 Gross per 24 hour  Intake 700 ml  Output --  Net 700 ml         Physical Exam: Vital Signs Blood pressure (!) 118/54, pulse 76, temperature 97.8 F (36.6 C), resp. rate 16, height 5' 5.98" (1.676 m), weight 80.6 kg, SpO2 95 %.   General: No acute distress, sitting in bed Mood and affect are appropriate Heart: Regular rate and rhythm no rubs murmurs or extra sounds Lungs: Clear to auscultation, breathing unlabored, no rales or wheezes Abdomen: Positive bowel sounds, soft nontender to palpation, nondistended Extremities: No clubbing, cyanosis, or edema Pain Left arm with elbow and shoulder ROM , no tenderness   Skin: warm, dry, Clean and intact without signs of breakdown Neuro: Cranial nerves II through XII intact, motor strength is 5/5 in right and 0/5 left deltoid, bicep, tricep,2-finger flexors and pronators , hip flexor or, knee extensors, ankle dorsiflexor and plantar flexor.  2-left hip ext and add.  Sensory exam normal to light touch and proprioception in right and No LT sensation left upper and lower extremities.  Negative SLR, pain with Achilles stretching on left.  Musculoskeletal: FROM all 4 extremities. No joint swelling, tender right parasternal   Assessment/Plan: 1. Functional deficits which require 3+ hours per day of interdisciplinary therapy in a comprehensive inpatient rehab setting. Physiatrist is providing close team supervision  and 24 hour management of active medical problems listed below. Physiatrist and rehab team continue to assess barriers to discharge/monitor patient progress toward functional and medical goals  Care Tool:  Bathing    Body parts bathed by patient: Left arm, Chest, Abdomen, Front perineal area, Right upper leg, Left upper leg, Face   Body parts bathed by helper: Right arm, Buttocks, Right lower leg, Left lower leg     Bathing assist Assist Level: Minimal Assistance - Patient > 75%     Upper Body Dressing/Undressing Upper body dressing   What is the patient wearing?: Pull over shirt    Upper body assist Assist Level: Moderate Assistance - Patient 50 - 74%    Lower Body Dressing/Undressing Lower body dressing      What is the patient wearing?: Pants     Lower body assist Assist for lower body dressing: Moderate Assistance - Patient 50 - 74%     Toileting Toileting Toileting Activity did not occur Press photographer and hygiene only): N/A (no void or bm)  Toileting assist Assist for toileting: Maximal Assistance - Patient 25 - 49%     Transfers Chair/bed transfer  Transfers assist  Chair/bed transfer activity did not occur: Safety/medical concerns  Chair/bed transfer assist level: Minimal Assistance - Patient > 75%     Locomotion Ambulation  Ambulation assist   Ambulation activity did not occur: Safety/medical concerns  Assist level: 2 helpers (mod A of 1 and +2 R HHA min A) Assistive device: Hand held assist Max distance: 50ft   Walk 10 feet activity   Assist  Walk 10 feet activity did not occur: Safety/medical concerns  Assist level: 2 helpers Assistive device: Other (comment) (rail in hallway)   Walk 50 feet activity   Assist Walk 50 feet with 2 turns activity did not occur: Safety/medical concerns         Walk 150 feet activity   Assist Walk 150 feet activity did not occur: Safety/medical concerns         Walk 10 feet on uneven  surface  activity   Assist Walk 10 feet on uneven surfaces activity did not occur: Safety/medical concerns         Wheelchair     Assist Is the patient using a wheelchair?: Yes Type of Wheelchair: Manual (TIS) Wheelchair activity did not occur: Safety/medical concerns         Wheelchair 50 feet with 2 turns activity    Assist    Wheelchair 50 feet with 2 turns activity did not occur: Safety/medical concerns       Wheelchair 150 feet activity     Assist  Wheelchair 150 feet activity did not occur: Safety/medical concerns       Blood pressure (!) 118/54, pulse 76, temperature 97.8 F (36.6 C), resp. rate 16, height 5' 5.98" (1.676 m), weight 80.6 kg, SpO2 95 %.  Medical Problem List and Plan: 1. 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  Antithrombotics: -DVT/anticoagulation:  Mechanical: Antiembolism stockings, knee hi  (TED hose) Bilateral lower extremities             -antiplatelet therapy: N/A 3. Pain Management: Tylenol as needed, has burning discomfort on Left side , discomfort LLE likely due to Thalamic stroke, Fibromyalgia add low dose duloxetine  duloxetine 20 mg will increase gabapentin to 200mg  TID 7/10- will reduce gabapentin to 100 mg TID by insistence of pt.  4. Mood/Sleep: Provide emotional support, pt frustrated with dependant role will stop buspirone and monitor              -antipsychotic agents: N/A 5. Neuropsych/cognition: This patient is capable of making decisions on her own behalf. 6. Skin/Wound Care: Routine skin checks 7. Fluids/Electrolytes/Nutrition: Routine in and outs with follow-up chemistries 8.  Dysphagia.  Dysphagia #3 thin liquids -on regular diet/thin 9.  Hypertension.  Cozaar 50 mg  daily, Lopressor 25 mg twice daily.  Monitor with increased mobility Vitals:   10/28/21 0611 10/28/21 1340  BP:  (!) 118/54  Pulse: 64 76  Resp:  16  Temp:  97.8 F (36.6 C)  SpO2:  95%   Controlled some borderline orthostatic changes related to FLomax 0.8 mg have reduced to 0.4mg   7/7 well controlled  7/10- will change AM Metoprolol to 0700 so can make sure gets by 8am 7/16 Well controlled 10.  PAF.  s/p ablation no need to restart Eliquis as per Dr  Casper Harrison, cardiology  11.  Obesity.  BMI 29.21.  Dietary follow-up 12.  History of recurrent UTI.  Prophylactic Macrodantin 50 mg daily.  -Check U/A and culture  -Reports dysuria - will check U/A 13.  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  Lethargic with poor sitting balance yesterday , repeat CT head unchanged  15.  Constipation ,resolved change miralax to PRN (was BID)   -LBM 7/16 improved 16.  Hot flashes, many years post menopausal, , gabapentin may help but could require a higher dose   Improved on premarin  17.  Episode of CPx 1 resolved, exam has tenderness RIght parasternal, EKG and troponin negative, likely MSK      LOS: 27 days A FACE TO FACE EVALUATION WAS PERFORMED  Fanny Dance 10/28/2021, 3:00 PM

## 2021-10-28 NOTE — Progress Notes (Signed)
Occupational Therapy Session Note  Patient Details  Name: Charlotte Hunter MRN: [id number] Date of Birth: July 12, 1955  Today's Date: 10/28/2021 OT Individual Time: 1405-1505 OT Individual Time Calculation (min): 60 min    Short Term Goals: Week 1:  OT Short Term Goal 1 (Week 1): Pt will maintain static sitting balance EOB with no more than min A for >5 min. OT Short Term Goal 1 - Progress (Week 1): Met OT Short Term Goal 2 (Week 1): Pt will complete >2 grooming tasks seated at sink with S. OT Short Term Goal 2 - Progress (Week 1): Progressing toward goal OT Short Term Goal 3 (Week 1): Pt will don shirt in supported sitting mod A. OT Short Term Goal 3 - Progress (Week 1): Progressing toward goal OT Short Term Goal 4 (Week 1): Pt will complete toilet transfer and LRAD with max A. OT Short Term Goal 4 - Progress (Week 1): Met  Skilled Therapeutic Interventions/Progress Updates: Patient seen for self care tasks as listed below. Worked with patient on functional squat pivot transfer to w/c and tub transfer bench. Patient did well with thorough explanation and increased time but expressed nervousness at performing a new task     Therapy Documentation Precautions:  Precautions Precautions: Fall, Other (comment) Precaution Comments: L hemiplegia, L inattention, R hip and knee pain Restrictions Weight Bearing Restrictions: No General:   Vital Signs: Therapy Vitals Temp: 97.8 F (36.6 C) Pulse Rate: 76 Resp: 16 BP: (!) 118/54 Patient Position (if appropriate): Lying Oxygen Therapy SpO2: 95 % O2 Device: Room Air Pain: Pain Assessment Pain Score: Reports pins and needles on left side  ADL: ADL Eating: Set up, Supervision/safety Where Assessed-Eating: Bed level Grooming: Setup- Assisted to hold blow drier when doing hair following shower Where Assessed-Grooming: Sitting at sink Upper Body Bathing: Moderate assist. Needed help to be thorough when washing under the LUE and washing  the RUE Where Assessed-Upper Body Bathing:shower Lower Body Bathing:  Mod/Max assist in shower seated to wash lower leg and buttocks Where Assessed-Lower Body Bathing: shower Upper Body Dressing: Minimal assistance Where Assessed-Upper Body Dressing: w/c Lower Body Dressing: Max assist with elastic waist brief Where Assessed-Lower Body Dressing: w/c Tub/Shower Transfer: Not assessed Social research officer, government: squat pivot transfer, Mod assist Social research officer, government Method: Squat pivot from w/c [employer]: Transfer tub bench .       Therapy/Group: Individual Therapy  Hermina Barters 10/28/2021, 4:56 PM

## 2021-10-29 LAB — URINE CULTURE

## 2021-10-29 NOTE — Progress Notes (Signed)
Occupational Therapy Session Note  Patient Details  Name: Ardys Hataway MRN: [id number] Date of Birth: 12/31/1955  Today's Date: 10/29/2021 OT Individual Time: 7829-5621 session 1 OT Individual Time Calculation (min): 53 min  Session 2: 3086-5784   Short Term Goals: Week 3:  OT Short Term Goal 1 (Week 3): STG= LTG  Skilled Therapeutic Interventions/Progress Updates:  Session 1: Pt greeted supine in bed reporting incresed pain in R low back back and R glute ( provided repositioning, distraction and rest breaks as pain mgmt strategies). Pt  agreeable to OT intervention with emhasis on slow incremental movement. Session focus on BADL reeducation, functional mobility, dynamic standing balance and decreasing overall caregiver burden.    Pt completed supine>sit with overall MOD A, needing assist to mange L hemibody and elevate trunk into sitting. Pt needing to complete bed mobility in short segmental movements d/t increased pain. Pt able to stand pivot to w/c with overall MODA to R side via face to face support and assist to pivot L foot during transfer. Pt transported to bathroom in w/c with total A where pt completed stand pivot to toilet using grab bar with MODA. Pt with + urine void needing MAX A for 3/3 toileting tasks. Pt completed stand pivot off of toilet to w/c with MODA. Pt needed MAX A to don pants today d/t increased back pain unable to lean forward to assist with pulling pants up, pt stood at bed rail today per pt request to pull pants up to waist line with MOD A but progressing to heavy MIN A. Pt needed total A to don shoes and socks. Pt completed seated oral care at sink with MIN A for time mgmt. pt left seated in w/c with alarm belt activated and all needs within reach.                     Session 2:Pt greeted supine in bed agreeable to OT intervention. Lengthy discussion about DC as pt reports the list of SNF that was provided to her daughter are not "good" ones with pt reporting her  ultimate goal was to go home. Informed pt that at this point the team was planning for DC to SNF but did discuss if she were to go home what would that look like with pt reporting she was unsure if a w/c would fit through her doorways. Suggested she talk to her daughter about her home set- up because even if she goes to SNF from CIR she would eventually like to return home and the home environment would need to be addressed.  Pt completed supine>sit with MOD A with pt able to stand with heavy MIN A needing MODA to pivot to w/c to R side. Pt completed ~ 15 ft of w/c propulsion using RUE and RLE with supervision. Pt transported the remainder of the distance d/t fatigue.  Pt requested lap tray for w/c, this OTA found one but her current w/c will not allow for lap try attachment.  1:1 NMES applied to L wrist extensors at the below settings:    Ratio 1:3 Rate 35 pps Waveform- Asymmetric Ramp 1.0 Pulse 300 Intensity- 21  Duration -  10 mins  With an emphasis on grasping and releasing cup and then bringing cup to her mouth, no adverse skin reactions noted after session with no pain reported during/after treatment. Worked on AROM in LUE with pt demo'ing ability to pronate wrist when wrist starting in neutral position, as well as supinate from  wrist starting in neutral position.  Pt also with 2+/5 elbow flexion/extension noted today ! Worked on scapular protraction/retraction with LUE placed on EOM and towel placed underneath arm with pt insturcted to push LUE forward/backward along EOM, pt completed x15 reps Pt transported back to room in w/c with total A where pt left up in w/c with all needs within reach.   Therapy Documentation Precautions:  Precautions Precautions: Fall, Other (comment) Precaution Comments: L hemiplegia, L inattention, R hip and knee pain Restrictions Weight Bearing Restrictions: No    Therapy/Group: Individual Therapy  Pollyann Glen [facility] 10/29/2021, 11:58 AM

## 2021-10-29 NOTE — Progress Notes (Signed)
Occupational Therapy Weekly Progress Note LATE ENTRY Patient Details  Name: Charlotte Hunter MRN: [id number] Date of Birth: 1955/12/17  Beginning of progress report period: October 24, 2021 End of progress report period: October 29, 2021  Today's Date: 10/29/2021 OT Individual Time: 7092-9574 OT Individual Time Calculation (min): 72 min    Patient has been working towards [place] as STGs with pt awating DC to SNF. Pt currently requires MIN A for bathing from shower level, MOD A for UB Dressing, MAX A for LB dressing, MAX A for 3/3 toileting tasks and MOD A for stand pivot transfers. Pt has gotten some improved functional return to LUE during this reporting period with pt presenting with trace activation in L shoulder and 2+/5 elbow flexion, pt also with 2+/5 wrist/digit flexion/extension. Pt continues to present with impaired balance, decreased activity tolerance, and generalized weakness. Plan is for DC to SNF, continue POC   Patient continues to demonstrate the following deficits: {impairments:3041632} and therefore will continue to benefit from skilled OT intervention to enhance overall performance with {ADL/iADL:3041649}.  Patient {LTG progression:3041653}.  {plan of BBUY:3709643}  OT Short Term Goals Week 1:  OT Short Term Goal 1 (Week 1): Pt will maintain static sitting balance EOB with no more than min A for >5 min. OT Short Term Goal 1 - Progress (Week 1): Met OT Short Term Goal 2 (Week 1): Pt will complete >2 grooming tasks seated at sink with S. OT Short Term Goal 2 - Progress (Week 1): Progressing toward goal OT Short Term Goal 3 (Week 1): Pt will don shirt in supported sitting mod A. OT Short Term Goal 3 - Progress (Week 1): Progressing toward goal OT Short Term Goal 4 (Week 1): Pt will complete toilet transfer and LRAD with max A. OT Short Term Goal 4 - Progress (Week 1): Met Week 2:  OT Short Term Goal 1 (Week 2): pt will engage in one grooming tasks from w/c at sink with MOD A OT  Short Term Goal 1 - Progress (Week 2): Met OT Short Term Goal 2 (Week 2): pt will completed 1/3 toileting tasks with MAXA OT Short Term Goal 2 - Progress (Week 2): Met OT Short Term Goal 3 (Week 2): pt will complete sit>stand with MOD A OT Short Term Goal 3 - Progress (Week 2): Met Week 3:  OT Short Term Goal 1 (Week 3): STG= LTG Week 4:  OT Short Term Goal 1 (Week 4): continue working towards [employer]  Skilled Therapeutic Interventions/Progress Updates:      Therapy Documentation Precautions:  Precautions Precautions: Fall, Other (comment) Precaution Comments: L hemiplegia, L inattention, R hip and knee pain Restrictions Weight Bearing Restrictions: No     Therapy/Group: Individual Therapy  [name] 10/29/2021, 3:52 PM

## 2021-10-29 NOTE — Progress Notes (Signed)
Patient ID: Charlotte Hunter, female   DOB: May 29, 1955, 66 y.o.   MRN: [id number]  SW left patient daughter a VM to discuss SNF. Sw will wait for follow up 7/18: Second attempt made

## 2021-10-29 NOTE — Progress Notes (Signed)
Physical Therapy Session Note  Patient Details  Name: Charlotte Hunter MRN: [id number] Date of Birth: 07-21-1955  Today's Date: 10/29/2021 PT Individual Time: 6060-0459 PT Individual Time Calculation (min): 74 min   Short Term Goals: Week 4:  PT Short Term Goal 1 (Week 4): = to LTGs based on ELOS  Skilled Therapeutic Interventions/Progress Updates:     Pt sitting in w/c to start. Agreeable to therapy session. Pt fully dressed with R GRAFO on. She reports high levels of R hip and LBP, chronic in nature. She's aware of when she's allowed to have more Rx. LPN made aware who provided pain Rx during treatment session in therapy gym.  Transported to main rehab gym for time management.   Stand<>pivot transfer with minA and no AD via face-to-face technique from w/c to mat table, mod cues for sequencing and setup. PT primarily facilitating lateral weight shifting and balance during transfer.   1x5 sit<>stands with minA and RW, mirror for visual feedback, completed for active warm up. TC for forward weight shift and hip hinge during transition. Cues for postural awareness in standing due to L trunk rotation. TC also for engaging L quad during stance.  Gait training using RW with min/modA and +2 for chair follow. Pt taking too large of steps on L with inadequate knee>hip flexion during swing. Cues for awareness of foot placement, widening BOS, upright posture, and lateral weight shifting.  Dynamic balance training using Maxi Sky in rehab gym. Harness donned/doffed in sitting for safety. Partial BWS during activities.  -Gait training 70ft + 65ft with no AD to challenge stability and balance during gait. Increased fear of falling with slower speed, less weight shifting and stance time on paretic LLE. Continued to lack sufficient knee flexion in swing on L. ModA overall for balance and facilitating gait.  -Standing ball taps with minA for balance with +2 assist to toss the ball. Ball tossed primarily in  forward, lateral, and upwards directions to challenge reaching, hip/ankle strategies, and endurance.   Pt returned to her room and remained slightly tilted in manual TIS w/c. Call bell in lap, all needs met.   Therapy Documentation Precautions:  Precautions Precautions: Fall, Other (comment) Precaution Comments: L hemiplegia, L inattention, R hip and knee pain Restrictions Weight Bearing Restrictions: No General:     Therapy/Group: Individual Therapy  [name] 10/29/2021, 7:50 AM

## 2021-10-29 NOTE — Progress Notes (Signed)
PROGRESS NOTE   Subjective/Complaints:  Sore RIght side of back and hip this am , improved after up in chair with OT   ROS:  Pt denies SOB, abd pain, CP, N/V/C/D, and vision changes    Objective:   No results found. No results for input(s): "WBC", "HGB", "HCT", "PLT" in the last 72 hours.  No results for input(s): "NA", "K", "CL", "CO2", "GLUCOSE", "BUN", "CREATININE", "CALCIUM" in the last 72 hours.   Intake/Output Summary (Last 24 hours) at 10/29/2021 0933 Last data filed at 10/28/2021 1822 Gross per 24 hour  Intake 360 ml  Output --  Net 360 ml         Physical Exam: Vital Signs Blood pressure 112/63, pulse 72, temperature 97.6 F (36.4 C), resp. rate 18, height 5' 5.98" (1.676 m), weight 80.6 kg, SpO2 90 %.   General: No acute distress, sitting in bed Mood and affect are appropriate Heart: Regular rate and rhythm no rubs murmurs or extra sounds Lungs: Clear to auscultation, breathing unlabored, no rales or wheezes Abdomen: Positive bowel sounds, soft nontender to palpation, nondistended Extremities: No clubbing, cyanosis, or edema Pain Left arm with elbow and shoulder ROM , no tenderness   Skin: warm, dry, Clean and intact without signs of breakdown Neuro: Cranial nerves II through XII intact, motor strength is 5/5 in right and 0/5 left deltoid, bicep, tricep,2-finger flexors and pronators , hip flexor or, knee extensors, ankle dorsiflexor and plantar flexor.  2-left hip ext and add.  Sensory exam normal to light touch and proprioception in right and No LT sensation left upper and lower extremities.  Negative SLR, pain with Achilles stretching on left.  Musculoskeletal: FROM all 4 extremities. Tender bilat lumbar paraspinal , RIght greater troch    Assessment/Plan: 1. Functional deficits which require 3+ hours per day of interdisciplinary therapy in a comprehensive inpatient rehab setting. Physiatrist is  providing close team supervision and 24 hour management of active medical problems listed below. Physiatrist and rehab team continue to assess barriers to discharge/monitor patient progress toward functional and medical goals  Care Tool:  Bathing    Body parts bathed by patient: Chest, Abdomen, Front perineal area, Right upper leg, Left upper leg   Body parts bathed by helper: Right arm, Left arm, Buttocks, Right lower leg, Left lower leg     Bathing assist Assist Level: Minimal Assistance - Patient > 75%     Upper Body Dressing/Undressing Upper body dressing   What is the patient wearing?: Pull over shirt    Upper body assist Assist Level: Moderate Assistance - Patient 50 - 74%    Lower Body Dressing/Undressing Lower body dressing      What is the patient wearing?: Underwear/pull up     Lower body assist Assist for lower body dressing: Moderate Assistance - Patient 50 - 74%     Toileting Toileting Toileting Activity did not occur Press photographer and hygiene only): N/A (no void or bm)  Toileting assist Assist for toileting: Maximal Assistance - Patient 25 - 49%     Transfers Chair/bed transfer  Transfers assist  Chair/bed transfer activity did not occur: Safety/medical concerns  Chair/bed transfer assist level: Moderate  Assistance - Patient 50 - 74%     Locomotion Ambulation   Ambulation assist   Ambulation activity did not occur: Safety/medical concerns  Assist level: 2 helpers (mod A of 1 and +2 R HHA min A) Assistive device: Hand held assist Max distance: 75ft   Walk 10 feet activity   Assist  Walk 10 feet activity did not occur: Safety/medical concerns  Assist level: 2 helpers Assistive device: Other (comment) (rail in hallway)   Walk 50 feet activity   Assist Walk 50 feet with 2 turns activity did not occur: Safety/medical concerns         Walk 150 feet activity   Assist Walk 150 feet activity did not occur: Safety/medical  concerns         Walk 10 feet on uneven surface  activity   Assist Walk 10 feet on uneven surfaces activity did not occur: Safety/medical concerns         Wheelchair     Assist Is the patient using a wheelchair?: Yes Type of Wheelchair: Manual (TIS) Wheelchair activity did not occur: Safety/medical concerns         Wheelchair 50 feet with 2 turns activity    Assist    Wheelchair 50 feet with 2 turns activity did not occur: Safety/medical concerns       Wheelchair 150 feet activity     Assist  Wheelchair 150 feet activity did not occur: Safety/medical concerns       Blood pressure 112/63, pulse 72, temperature 97.6 F (36.4 C), resp. rate 18, height 5' 5.98" (1.676 m), weight 80.6 kg, SpO2 90 %.  Medical Problem List and Plan: 1. 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  Antithrombotics: -DVT/anticoagulation:  Mechanical: Antiembolism stockings, knee hi  (TED hose) Bilateral lower extremities             -antiplatelet therapy: N/A 3. Pain Management: Tylenol as needed, has burning discomfort on Left side , discomfort LLE likely due to Thalamic stroke, Fibromyalgia add low dose duloxetine- also with some soft tissue pain related to positioning in bed overnite   duloxetine 20 mg  4. Mood/Sleep: Provide emotional support, pt frustrated with dependant role will stop buspirone and monitor              -antipsychotic agents: N/A 5. Neuropsych/cognition: This patient is capable of making decisions on her own behalf. 6. Skin/Wound Care: Routine skin checks 7. Fluids/Electrolytes/Nutrition: Routine in and outs with follow-up chemistries 8.  Dysphagia.  Dysphagia #3 thin liquids -on regular diet/thin 9.  Hypertension.   Cozaar 50 mg daily, Lopressor 25 mg twice daily.  Monitor with increased mobility Vitals:   10/28/21 1956 10/29/21 0350  BP: 129/62 112/63  Pulse: 80 72  Resp: 18 18  Temp: 98 F (36.7 C) 97.6 F (36.4 C)  SpO2: 94% 90%     7/10- will change AM Metoprolol to 0700 so can make sure gets by 8am 7/16 Well controlled 10.  PAF.  s/p ablation no need to restart Eliquis as per Dr  Sandy Salaam, cardiology  11.  Obesity.  BMI 29.21.  Dietary follow-up 12.  History of recurrent  UTI.  Prophylactic Macrodantin 50 mg daily.  -Check U/A and culture  -Reports dysuria - will check U/A 13.  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  Constipation ,resolved change miralax to PRN (was BID)   -LBM 7/16 improved 16.  Hot flashes, many years post menopausal, , gabapentin may help but could require a higher dose   Improved on premarin  17.  Episode of CPx 1 resolved, exam has tenderness RIght parasternal, EKG and troponin negative, likely MSK      LOS: 28 days A FACE TO FACE EVALUATION WAS PERFORMED  Charlett Blake 10/29/2021, 9:33 AM

## 2021-10-30 NOTE — Progress Notes (Signed)
Physical Therapy Session Note  Patient Details  Name: Charlotte Hunter MRN: [id number] Date of Birth: [birth year]  Today's Date: 10/30/2021 PT Individual Time: 9806-0789 PT Individual Time Calculation (min): 40 min   Short Term Goals: Week 4:  PT Short Term Goal 1 (Week 4): = to LTGs based on ELOS  Skilled Therapeutic Interventions/Progress Updates:     Pt sitting in w/c at the sink completing some self care tasks (brushing teeth and combing hair). LPN in the room as well administering morning Rx. Pt agreeable to therapy without reports of pain and well nights rest. Voltaren gel applied to B knees - LPN notified.   Donned socks/tennis shoes and R GRAFO with dependant assist for time management. Transported to day room rehab gym in w/c.  Stand<>pivot transfer with heavy minA from w/c to mat table, towards her stronger R side. Assist required for lateral trunk support on L and mod cues for sequencing.  Instructed in gait training using face-to-face technique to better understand deficits and challenge stability. Sit<>Stand with minA and ambulated 38f + 173fwith heavy modA and no AD. Primary deficits include narrow BOS with inconsistent step length/placement on L, heavy trunk lean L. Pt reports that PT is "pushing her to her R" but that is to facilitate lateral weight shifting and to manage leaning to her L. Safely allowed her to lose her balance to the L for her to better understand deficits and balance impairments during gait.   Pt propelled herself with distant supervision in manual TIS w/c [id number] to her room, using hemi technique on R. Concluded session slightly reclined in w/c, all needs met, call bell in lap.   Therapy Documentation Precautions:  Precautions Precautions: Fall, Other (comment) Precaution Comments: L hemiplegia, L inattention, R hip and knee pain Restrictions Weight Bearing Restrictions: No General:     Therapy/Group: Individual Therapy  Jt Brabec P  Hilliard Borges 10/30/2021, 7:30 AM

## 2021-10-30 NOTE — Progress Notes (Signed)
Physical Therapy Session Note  Patient Details  Name: Charlotte Hunter MRN: [id number] Date of Birth: [birth year]  Today's Date: 10/30/2021 PT Individual Time: 1410-1510 and 1723-1801 PT Individual Time Calculation (min): 60 min and 38 min   Short Term Goals: Week 4:  PT Short Term Goal 1 (Week 4): = to LTGs based on ELOS PT Short Term Goal 1 - Progress (Week 4): Progressing toward goal Week 5:  PT Short Term Goal 1 (Week 5): = to LTGs based on ELOS  Skilled Therapeutic Interventions/Progress Updates:      Session 1: Pt received supine in bed and agreeable to therapy session; however, asking therapist to discuss her therapy POC, D/C, and anticipated long term term goals with her and her daughter.  Pt called her daughter, Charlotte Hunter, and therapist discussed the following with the pt and her daughter via phone:  - current D/C plan with anticipated SNF placement with pt's daughter stating she is not comfortable with the current SNF options based on their ratings and reviews  - pt's daughter asking about alternate D/C plan with only other option of being D/C home with 24hr support; educated on the following regarding this plan: -- pt's home needing to be wheelchair accessible via ramp and wider doorways  -- pt needing a custom wheelchair consultation -- pt's daughter needing to attend in-person hands-on training with all therapists, including benefits of performing real-life car transfer -- options of HHPT vs OPPT - Educated pt/daughter that pt will achieve more independence at a wheelchair level sooner than at an ambulatory level.  Pt/daughter appreciative of education and verbalize understanding. They state they will reflect on this information and get back to the SW to discuss next steps.  Once pt's daughter off the phone, pt starts crying, confiding in therapist about how upset she is with this sudden change in her life and how she has lost all independence and is worried about being a burden on  her daughter. Pt expresses that she would rather be dead than to go die slowly in a nursing home. Therapist provided emotional support and encouragement to the pt about her progress thus far and motivated her to continue having a positive attitude and continue working hard in therapy because it has allowed her to achieve a min assist level so far.  At end of session, pt left supine in bed with needs in reach, L hemibody positioned with pillows and bed alarm on.   Session 2: Pt received supine in bed and agreeable to therapy session. Pt reports she reflected on the earlier D/C conversation with this therapist and states she feels her home is more accessible via a RW - therapist reinforced education that she is not safe to utilize a RW without skilled assistance at this time and will likely achieve more independence at a wheelchair level prior to an ambulatory level. Pt verbalizes understanding.  Donned B LE shoes and L LE Matrix Max GRAFO total assist for time management.  Supine>sitting R EOB, HOB elevated and using R UE support on bedrail, with SUPERVISION!  Therapist educated pt on proper set-up of wheelchair for squat pivot transfers and educated pt that squat pivot transfers are more safe than stand pivot transfers because they require less balance and don't require stepping.   Pt completed block practice squat pivot transfers EOB<>w/c with therapist providing step-by-step cuing for sequencing and L hemibody management as well as educating on head/hips relationship - pt completed transfers with light min assist in both directions.  Therapist educated pt on wheelchair part management including armrest, foot rests, and brakes - pt demonstrating understanding, but needing more practice.  Pt completed ~53ft of R hemibody w/c propulsion in room including to go open room door all with supervision and intermittent verbal cuing.  Pt able to demonstrates recall of proper positioning to set-up  wheelchair for squat pivot transfer, but stays in the w/c at this time for her meal.   Pt let seated in w/c with needs in reach, meal tray set-up, and seat belt alarm on.  Therapy Documentation Precautions:  Precautions Precautions: Fall, Other (comment) Precaution Comments: L hemiplegia, L inattention, R hip and knee pain Restrictions Weight Bearing Restrictions: No   Pain: Session 1: Pt reports she was premedicated.  Session 2: Pt reports she was premedicated and receives additional medication during session.    Therapy/Group: Individual Therapy  Ginny Forth , PT, DPT, NCS, CSRS 10/30/2021, 6:12 PM

## 2021-10-30 NOTE — Progress Notes (Signed)
Physical Therapy Weekly Progress Note  Patient Details  Name: Alencia Gordon MRN: [id number] Date of Birth: Sep 17, 1955  Beginning of progress report period: October 23, 2021 End of progress report period: October 30, 2021  Patient has met 0 of 1 short term goals due to no STGs being created last week based on ELOS. Ms. Morocco continues to make great progress with therapy demonstrating increasing independence with functional mobility with her greatest deficits continuing to be L hemiplegia, decreased L attention with impaired midline orientation, and impaired standing balance. She is performing supine<>sit with CGA, sit<>stands with min assist, squat vs stand pivot transfers with min assist, and ambulating up to 25-59f using RW with mod assist of 1 for balance due to strong L lean and for AD management. She will benefit from continued skilled physical therapy to further advance her independence. Pt and pt's daughter are currently deciding between D/C to SNF vs now possibility of D/Cing home; however, still determining if pt would have adequate support at home to maintain her safety at a wheelchair level.  Patient continues to demonstrate the following deficits muscle weakness and muscle paralysis, decreased cardiorespiratoy endurance, impaired timing and sequencing, abnormal tone, unbalanced muscle activation, and decreased motor planning, decreased attention to left, decreased attention, decreased awareness, decreased problem solving, and decreased safety awareness, and decreased sitting balance, decreased standing balance, decreased postural control, hemiplegia, and decreased balance strategies and therefore will continue to benefit from skilled PT intervention to increase functional independence with mobility.  Patient progressing toward long term goals. Continue plan of care.  PT Short Term Goals Week 4:  PT Short Term Goal 1 (Week 4): = to LTGs based on ELOS PT Short Term Goal 1 - Progress (Week 4):  Progressing toward goal Week 5:  PT Short Term Goal 1 (Week 5): = to LTGs based on ELOS  Skilled Therapeutic Interventions/Progress Updates:  Ambulation/gait training;Community reintegration;DME/adaptive equipment instruction;Neuromuscular re-education;Psychosocial support;Stair training;UE/LE Strength taining/ROM;Wheelchair propulsion/positioning;Balance/vestibular training;Discharge planning;Functional electrical stimulation;Pain management;Skin care/wound management;UE/LE Coordination activities;Therapeutic Activities;Cognitive remediation/compensation;Disease management/prevention;Functional mobility training;Patient/family education;Splinting/orthotics;Therapeutic Exercise;Visual/perceptual remediation/compensation   Therapy Documentation Precautions:  Precautions Precautions: Fall, Other (comment) Precaution Comments: L hemiplegia, L inattention, R hip and knee pain Restrictions Weight Bearing Restrictions: No   CTawana Scale, PT, DPT, NCS, CSRS 10/30/2021, 12:36 PM

## 2021-10-30 NOTE — Progress Notes (Signed)
PROGRESS NOTE   Subjective/Complaints:  No issues overnite  ROS:  Pt denies SOB, abd pain, CP, N/V/C/D, and vision changes    Objective:   No results found. No results for input(s): "WBC", "HGB", "HCT", "PLT" in the last 72 hours.  No results for input(s): "NA", "K", "CL", "CO2", "GLUCOSE", "BUN", "CREATININE", "CALCIUM" in the last 72 hours.   Intake/Output Summary (Last 24 hours) at 10/30/2021 0823 Last data filed at 10/29/2021 2050 Gross per 24 hour  Intake 240 ml  Output --  Net 240 ml         Physical Exam: Vital Signs Blood pressure (!) 96/45, pulse 64, temperature 97.8 F (36.6 C), resp. rate 17, height 5' 5.98" (1.676 m), weight 80.6 kg, SpO2 95 %.   General: No acute distress, sitting in bed Mood and affect are appropriate Heart: Regular rate and rhythm no rubs murmurs or extra sounds Lungs: Clear to auscultation, breathing unlabored, no rales or wheezes Abdomen: Positive bowel sounds, soft nontender to palpation, nondistended Extremities: No clubbing, cyanosis, or edema Pain Left arm with elbow and shoulder ROM , no tenderness   Skin: warm, dry, Clean and intact without signs of breakdown Neuro: Cranial nerves II through XII intact, motor strength is 5/5 in right and 0/5 left deltoid, bicep, tricep,2-finger flexors and pronators , hip flexor or, knee extensors, ankle dorsiflexor and plantar flexor.  2-left hip ext and add.  Sensory exam normal to light touch and proprioception in right and No LT sensation left upper and lower extremities.  Negative SLR, pain with Achilles stretching on left.  Musculoskeletal: FROM all 4 extremities. Tender bilat lumbar paraspinal , RIght greater troch    Assessment/Plan: 1. Functional deficits which require 3+ hours per day of interdisciplinary therapy in a comprehensive inpatient rehab setting. Physiatrist is providing close team supervision and 24 hour management of  active medical problems listed below. Physiatrist and rehab team continue to assess barriers to discharge/monitor patient progress toward functional and medical goals  Care Tool:  Bathing    Body parts bathed by patient: Chest, Abdomen, Front perineal area, Right upper leg, Left upper leg   Body parts bathed by helper: Right arm, Left arm, Buttocks, Right lower leg, Left lower leg     Bathing assist Assist Level: Minimal Assistance - Patient > 75%     Upper Body Dressing/Undressing Upper body dressing   What is the patient wearing?: Pull over shirt    Upper body assist Assist Level: Moderate Assistance - Patient 50 - 74%    Lower Body Dressing/Undressing Lower body dressing      What is the patient wearing?: Underwear/pull up, Pants     Lower body assist Assist for lower body dressing: Maximal Assistance - Patient 25 - 49%     Toileting Toileting Toileting Activity did not occur (Clothing management and hygiene only): N/A (no void or bm)  Toileting assist Assist for toileting: Maximal Assistance - Patient 25 - 49%     Transfers Chair/bed transfer  Transfers assist  Chair/bed transfer activity did not occur: Safety/medical concerns  Chair/bed transfer assist level: Moderate Assistance - Patient 50 - 74%     Locomotion Ambulation  Ambulation assist   Ambulation activity did not occur: Safety/medical concerns  Assist level: 2 helpers (mod A of 1 and +2 R HHA min A) Assistive device: Hand held assist Max distance: 62ft   Walk 10 feet activity   Assist  Walk 10 feet activity did not occur: Safety/medical concerns  Assist level: 2 helpers Assistive device: Other (comment) (rail in hallway)   Walk 50 feet activity   Assist Walk 50 feet with 2 turns activity did not occur: Safety/medical concerns         Walk 150 feet activity   Assist Walk 150 feet activity did not occur: Safety/medical concerns         Walk 10 feet on uneven surface   activity   Assist Walk 10 feet on uneven surfaces activity did not occur: Safety/medical concerns         Wheelchair     Assist Is the patient using a wheelchair?: Yes Type of Wheelchair: Manual (TIS) Wheelchair activity did not occur: Safety/medical concerns         Wheelchair 50 feet with 2 turns activity    Assist    Wheelchair 50 feet with 2 turns activity did not occur: Safety/medical concerns       Wheelchair 150 feet activity     Assist  Wheelchair 150 feet activity did not occur: Safety/medical concerns       Blood pressure (!) 96/45, pulse 64, temperature 97.8 F (36.6 C), resp. rate 17, height 5' 5.98" (1.676 m), weight 80.6 kg, SpO2 95 %.  Medical Problem List and Plan: 1. 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  Antithrombotics: -DVT/anticoagulation:  Mechanical: Antiembolism stockings, knee hi  (TED hose) Bilateral lower extremities             -antiplatelet therapy: N/A 3. Pain Management: Tylenol as needed, has burning discomfort on Left side , discomfort LLE likely due to Thalamic stroke, Fibromyalgia add low dose duloxetine- also with some soft tissue pain related to positioning in bed overnite   duloxetine 20 mg  4. Mood/Sleep: Provide emotional support, pt frustrated with dependant role will stop buspirone and monitor              -antipsychotic agents: N/A 5. Neuropsych/cognition: This patient is capable of making decisions on her own behalf. 6. Skin/Wound Care: Routine skin checks 7. Fluids/Electrolytes/Nutrition: Routine in and outs with follow-up chemistries 8.  Dysphagia.  Dysphagia #3 thin liquids -on regular diet/thin 9.  Hypertension.  Cozaar 50 mg daily, Lopressor 25 mg twice daily.   Monitor with increased mobility Vitals:   10/29/21 1937 10/30/21 0325  BP: (!) 126/59 (!) 96/45  Pulse: 80 64  Resp: 19 17  Temp: 98 F (36.7 C) 97.8 F (36.6 C)  SpO2: 97% 95%    BP on low side denies dizziness with standing  10.  PAF.  s/p ablation no need to restart Eliquis as per Dr  Sandy Salaam, cardiology  11.  Obesity.  BMI 29.21.  Dietary follow-up 12.  History of recurrent UTI.  Prophylactic Macrodantin 50 mg daily.  -Check U/A and culture  -Reports dysuria - will check U/A 13.  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  Constipation ,resolved change miralax to PRN (was BID)   -LBM 7/16 improved 16.  Hot flashes, many years post menopausal, , gabapentin may help but could require a higher dose   Improved on premarin  17.  Episode of CPx 1 resolved, exam has tenderness RIght parasternal, EKG and troponin negative, likely MSK      LOS: 29 days A FACE TO FACE EVALUATION WAS PERFORMED  Charlett Blake 10/30/2021, 8:23 AM

## 2021-10-30 NOTE — Progress Notes (Signed)
Occupational Therapy Session Note  Patient Details  Name: Charlotte Hunter MRN: [id number] Date of Birth: Sep 03, 1955  Today's Date: 10/30/2021 OT Individual Time: 1102-1203 OT Individual Time Calculation (min): 61 min    Short Term Goals: Week 4:  OT Short Term Goal 1 (Week 4): continue working towards [employer]  Skilled Therapeutic Interventions/Progress Updates:  Pt greeted supine in bed, pt  agreeable to OT intervention. Session focus on BADL reeducation, functional mobility, dynamic standing balance and decreasing overall caregiver burden.   Pt completed supine>sit with light MIN A with + time and effort. Pt completed stand pivot to w/c with MODA via stand pivot. Total A transport into bathroom, pt completed stand pivot to walkin shower with MODA. Pt completed bathing from shower seat with overall MIN A able to use LUE to wash her L leg some.  Utilized stedy to exit shower for + safety. Pt completed dressing from EOB with MOD A for UB dressing utilizing new hemi technique that seems to be work better for pt. Pt able to assist more with LB dressing completing task with MOD A with pt helping more to pull pants up on R side via sit>stand.  Pt left seated EOB with LPN present assisting pt with blow drying her hair.                    Therapy Documentation Precautions:  Precautions Precautions: Fall, Other (comment) Precaution Comments: L hemiplegia, L inattention, R hip and knee pain Restrictions Weight Bearing Restrictions: No  Pain: No pain reported during session    Therapy/Group: Individual Therapy  Barron Schmid 10/30/2021, 12:17 PM

## 2021-10-31 LAB — BASIC METABOLIC PANEL
Anion gap: 8 (ref 5–15)
BUN: 13 mg/dL (ref 8–23)
CO2: 25 mmol/L (ref 22–32)
Calcium: 9.8 mg/dL (ref 8.9–10.3)
Chloride: 106 mmol/L (ref 98–111)
Creatinine, Ser: 0.61 mg/dL (ref 0.44–1.00)
GFR, Estimated: >60 mL/min (ref >60)
Glucose, Bld: 111 mg/dL — ABNORMAL HIGH (ref 70–99)
Potassium: 3.6 mmol/L (ref 3.5–5.1)
Sodium: 139 mmol/L (ref 135–145)

## 2021-10-31 NOTE — Progress Notes (Signed)
Patient ID: Charlotte Hunter, female   DOB: 1955-12-23, 66 y.o.   MRN: [id number]  SNF preferences provided:  [facility] [employer]

## 2021-10-31 NOTE — Progress Notes (Addendum)
Patient ID: Charlotte Hunter, female   DOB: [birth year], 66 y.o.   MRN: [id number]  Team Conference Report to Patient/Family  Team Conference discussion was reviewed with the patient and caregiver, including goals, any changes in plan of care and target discharge date.  Patient and caregiver express understanding and are in agreement.  The patient has a target discharge date of 11/06/21.  SW and team discussed with patient and daughter of the reccommended d/c plan for SNF. Patient daughter informed that patient will need to d/c yo SNF by Friday or the team will plan for a discharge home on Tuesday 7/25. Patient daughter has provided three preferred facilities. Sw has followed up with facilities and daughter anticipates touring tomorrow.   Daughter informed of patient [facility] consult scheduled for tomorrow. Daughter informed by PT&OT the need for a ramp and family education in case of potential d/c home. Daughter has planned to attend education Friday, Saturday and Monday 9-12. No additional questions or concerns.   Andria Rhein 10/31/2021, 1:38 PM

## 2021-10-31 NOTE — Progress Notes (Signed)
PROGRESS NOTE   Subjective/Complaints:  No issues overnite  ROS:  Pt denies SOB, abd pain, CP, N/V/C/D, and vision changes    Objective:   No results found. No results for input(s): "WBC", "HGB", "HCT", "PLT" in the last 72 hours.  Recent Labs    10/31/21 0616  NA 139  K 3.6  CL 106  CO2 25  GLUCOSE 111*  BUN 13  CREATININE 0.61  CALCIUM 9.8     Intake/Output Summary (Last 24 hours) at 10/31/2021 0933 Last data filed at 10/31/2021 0750 Gross per 24 hour  Intake 1757 ml  Output --  Net 1757 ml         Physical Exam: Vital Signs Blood pressure 135/70, pulse 70, temperature 98.4 F (36.9 C), resp. rate 17, height 5' 5.98" (1.676 m), weight 80.6 kg, SpO2 93 %.   General: No acute distress, sitting in bed Mood and affect are appropriate Heart: Regular rate and rhythm no rubs murmurs or extra sounds Lungs: Clear to auscultation, breathing unlabored, no rales or wheezes Abdomen: Positive bowel sounds, soft nontender to palpation, nondistended Extremities: No clubbing, cyanosis, or edema Pain Left arm with elbow and shoulder ROM , no tenderness   Skin: warm, dry, Clean and intact without signs of breakdown Neuro: Cranial nerves II through XII intact, motor strength is 5/5 in right and 0/5 left deltoid, bicep, tricep,2-finger flexors and pronators , hip flexor or, knee extensors, ankle dorsiflexor and plantar flexor.  2-left hip ext and add.  Sensory exam normal to light touch and proprioception in right and No LT sensation left upper and lower extremities.  Negative SLR, pain with Achilles stretching on left.  Musculoskeletal: FROM all 4 extremities. Tender bilat lumbar paraspinal , RIght greater troch    Assessment/Plan: 1. Functional deficits which require 3+ hours per day of interdisciplinary therapy in a comprehensive inpatient rehab setting. Physiatrist is providing close team supervision and 24 hour  management of active medical problems listed below. Physiatrist and rehab team continue to assess barriers to discharge/monitor patient progress toward functional and medical goals  Care Tool:  Bathing    Body parts bathed by patient: Left arm, Chest, Abdomen, Front perineal area, Right upper leg, Left upper leg, Right lower leg, Left lower leg, Face   Body parts bathed by helper: Right arm     Bathing assist Assist Level: Minimal Assistance - Patient > 75%     Upper Body Dressing/Undressing Upper body dressing   What is the patient wearing?: Pull over shirt    Upper body assist Assist Level: Moderate Assistance - Patient 50 - 74%    Lower Body Dressing/Undressing Lower body dressing      What is the patient wearing?: Underwear/pull up, Pants     Lower body assist Assist for lower body dressing: Moderate Assistance - Patient 50 - 74%     Toileting Toileting Toileting Activity did not occur Landscape architect and hygiene only): N/A (no void or bm)  Toileting assist Assist for toileting: Maximal Assistance - Patient 25 - 49%     Transfers Chair/bed transfer  Transfers assist  Chair/bed transfer activity did not occur: Safety/medical concerns  Chair/bed transfer assist  level: Minimal Assistance - Patient > 75% (squat pivot)     Locomotion Ambulation   Ambulation assist   Ambulation activity did not occur: Safety/medical concerns  Assist level: 2 helpers (mod A of 1 and +2 R HHA min A) Assistive device: Hand held assist Max distance: 28ft   Walk 10 feet activity   Assist  Walk 10 feet activity did not occur: Safety/medical concerns  Assist level: 2 helpers Assistive device: Other (comment) (rail in hallway)   Walk 50 feet activity   Assist Walk 50 feet with 2 turns activity did not occur: Safety/medical concerns         Walk 150 feet activity   Assist Walk 150 feet activity did not occur: Safety/medical concerns         Walk 10 feet  on uneven surface  activity   Assist Walk 10 feet on uneven surfaces activity did not occur: Safety/medical concerns         Wheelchair     Assist Is the patient using a wheelchair?: Yes Type of Wheelchair: Manual Wheelchair activity did not occur: Safety/medical concerns  Wheelchair assist level: Set up assist, Supervision/Verbal cueing Max wheelchair distance: 72ft    Wheelchair 50 feet with 2 turns activity    Assist    Wheelchair 50 feet with 2 turns activity did not occur: Safety/medical concerns       Wheelchair 150 feet activity     Assist  Wheelchair 150 feet activity did not occur: Safety/medical concerns       Blood pressure 135/70, pulse 70, temperature 98.4 F (36.9 C), resp. rate 17, height 5' 5.98" (1.676 m), weight 80.6 kg, SpO2 93 %.  Medical Problem List and Plan: 1. 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 Formulized individual treatment plan based on medical history, underlying problem and comorbidities.   Con't CIR- PT, OT and SLP 2.  Antithrombotics: -DVT/anticoagulation:  Mechanical: Antiembolism stockings, knee hi  (TED hose) Bilateral lower extremities             -antiplatelet therapy: N/A 3. Pain Management: Tylenol as needed, has burning discomfort on Left side , discomfort LLE likely due to Thalamic stroke, Fibromyalgia add low dose duloxetine- also with some soft tissue pain related to positioning in bed overnite   duloxetine 20 mg  4. Mood/Sleep: Provide emotional support, pt frustrated with dependant role will stop buspirone and monitor              -antipsychotic agents: N/A 5.  Neuropsych/cognition: This patient is capable of making decisions on her own behalf. 6. Skin/Wound Care: Routine skin checks 7. Fluids/Electrolytes/Nutrition: Routine in and outs with follow-up chemistries 8.  Dysphagia.  Dysphagia #3 thin liquids -on regular diet/thin 9.  Hypertension.  Cozaar 50 mg daily, Lopressor 25 mg twice daily.  Monitor with increased mobility Vitals:   10/31/21 0444 10/31/21 0647  BP: 122/62 135/70  Pulse: 73 70  Resp: 17   Temp: 98.4 F (36.9 C)   SpO2: 93%     BP on low side denies dizziness with standing  10.  PAF.  s/p ablation no need to restart Eliquis as per Dr  Casper Harrison, cardiology  11.  Obesity.  BMI 29.21.  Dietary follow-up 12.  History of recurrent UTI.  Prophylactic Macrodantin 50 mg daily.  -Check U/A and culture  -Reports dysuria - will check U/A 13.  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  Constipation ,resolved change miralax to PRN (was BID)   -LBM 7/16 improved 16.  Hot flashes, many years post menopausal, , gabapentin may help but could require a higher dose   Improved on premarin  17.  Episode of CPx 1 resolved, exam has tenderness RIght parasternal, EKG and troponin negative, likely MSK      LOS: 30 days A FACE TO FACE EVALUATION WAS PERFORMED  Charlett Blake 10/31/2021, 9:33 AM

## 2021-10-31 NOTE — Progress Notes (Signed)
Occupational Therapy Session Note  Patient Details  Name: Charlotte Hunter MRN: [id number] Date of Birth: 1955/09/17  Today's Date: 10/31/2021 OT Individual Time: 1245-1255 session 1 OT Individual Time Calculation (min): 10 min  Session 2: [id number]    Short Term Goals: Week 4:  OT Short Term Goal 1 (Week 4): continue working towards [employer]  Skilled Therapeutic Interventions/Progress Updates:  Session 1: Pt seen for family meeting with MD, PT and this OTA present. Meeting topics included: -  plan for d/c to SNF by Friday 7/21 or the team will plan for a discharge home on Tuesday 7/25, however reiterated that in the mean time it is recommended that daughter go ahead and work towards assessing home environment I.e doorway width, need for a ramp, and shower set- up - education provided that a w/c consult is set- up for tomorrow if the plan does change for pt to return home, recommended that daughter attend w/c consult - reiterated importance of beginning family training with daughter now in case plan is for pt to return home with dates decided on for Friday, Saturday and Monday 9-12. Pt left seated in w/c with daugter present and all needs within reach.    session 2:  Pt greeted seated in w/c eating lunch with her daughter.    Provided general education to pts daughter on her current level of assist being mostly MOD- MIN A overall for ADLS. Provided verbal education of stand pivot transfers and briefly discussed home set- up with daughter concerned w/c will not fit through bathroom doorway, pt stating that she feels like she will be able to use RW to enter the bathroom. While this OTA appreciates pts optimism provided education that at this point pt is not to try to use RW to enter bathroom and it is likely that pt will need at least CGA- MIN A even after SNF stay therefore recommend pt think in terms of MOD I from w/c level. Recommended daughter complete measurement sheet tonight and then reassess  during next available time.  Pt transported to gym from w/c with total A where this OTA demo'ed stand pivot transfer with pt from w/c<>EOM, education included: -using gait belt  - guarding and standing on pts L side - recommended body mechanics during sit>stand  - fall prevention strategies such as locking brakes during transfers.          Pt and daughter demo'ed x2 stand pivot transfers all to pts R side with overall MINA, daughter needed cues for body positioning and overall technique however pt seemed very pleased with daughter level of comfort during transfer.  Pt transported back to room with total A where pt completed stand pivot transfer from w/c>EOB with MIN A.   Pt left supine in bed with nurse present providing meds.     Precautions:  Precautions Precautions: Fall, Other (comment) Precaution Comments: L hemiplegia, L inattention, R hip and knee pain Restrictions Weight Bearing Restrictions: No  Pain: Session 1: no pain reported during session Session 2: unrated pain reported in R knee during transfer, rest break provided with pain meds also provided.     Therapy/Group: Individual Therapy  Barron Schmid 10/31/2021, 3:43 PM

## 2021-10-31 NOTE — Patient Care Conference (Signed)
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 Claudette Laws Social Worker Present: Lavera Guise, BSW Nurse Present: Chana Bode, RN PT Present: Casimiro Needle, PT OT Present: Roney Mans, OT;Mary Felipe Drone, COTA SLP Present: Feliberto Gottron, SLP PPS Coordinator present : Fae Pippin, SLP     Current Status/Progress Goal Weekly Team Focus  Bowel/Bladder     continent        Swallow/Nutrition/ Hydration             ADL's   pt completes bathing from shower with MIN A, able to stand pivot into shower with MOD A via stand pivot, MOD A for UB dressing via hemi technique and MOD A for LB dressing. pt getting more AROM in LUE ( wrist, digits, elbow, working on shoulder shrugs), MOD A for stand pivots with HHA and MIN A for sti>stand during dressing  downgraded to mostly MIN A  BADL Reeducation, transfer training, balance training, functional mobility, LUE NMR, Problem identification, problem solving   Mobility   CGA supine<>sit using bed features, min assist sit<>stand, min assist squat vs stand pivot transfers, gait up to 41ft using RW with mod assist due to strong L lean (pt now able to advance L LE without assist during swing phase and stabilize during stance wearing GRAFO), supervision R hemi-technique wheelchair propulsion  174ft  min assist overall - could upgrade pending additional LOS  pt/family education, bed mobility training, transfer training, L hemibody NMR, dynamic standing balance, dynamic gait training with LRAD, DME training, wheelchair management and propulsion   Communication             Safety/Cognition/ Behavioral Observations            Pain     Pain managed with scheduled tylenol and muscle relaxers   Pain managed   Assess need for and effectiveness of meds  Skin     N/a          Discharge Planning:  D/C to SNF once daughter agrees to bed offer   Team Discussion: Patient is medically stable per MD and has made gains during admission. Discussion of discharge destination/plan; family conference scheduled for today at noon to review d/c options.  Patient on target to meet rehab goals: Currently able to complete a stand pivot to the shower with mod assist and complete lower body dressing with mod assist.   *See Care Plan and progress notes for long and short-term goals.   Revisions to Treatment Plan:  N/a  Teaching Needs: Safety, medications, transfers, toileting, etc  Current Barriers to Discharge: Decreased caregiver support and Home enviroment access/layout  Possible Resolutions to Barriers: Family education with daughter Discussion of options     Medical Summary Current Status: mod A showering, Mod A ADL,         Continued Need for  Acute Rehabilitation Level of Care: The patient requires daily medical management by a physician with specialized training in physical medicine and rehabilitation for the following reasons: Direction of a multidisciplinary physical rehabilitation program to maximize functional independence : Yes Medical management of patient stability for increased activity during participation in an intensive rehabilitation regime.: Yes Analysis of laboratory values and/or radiology reports with any subsequent need for medication adjustment and/or medical  intervention. : Yes   I attest that I was present, lead the team conference, and concur with the assessment and plan of the team.   Chana Bode B 10/31/2021, 4:51 PM

## 2021-10-31 NOTE — Progress Notes (Signed)
Physical Therapy Session Note  Patient Details  Name: Charlotte Hunter MRN: [id number] Date of Birth: 1955-09-27  Today's Date: 10/31/2021 PT Individual Time: 1115-1208 and 7902-4097 PT Individual Time Calculation (min): 53 min and 64 min  Short Term Goals: Week 5:  PT Short Term Goal 1 (Week 5): = to LTGs based on ELOS  Skilled Therapeutic Interventions/Progress Updates:    Session 1: Pt received supine in bed and agreeable to therapy session. In supine, pt able to move L LE into hooklying position of ~80degrees knee flexion without assist and without looking at her LE - unable to reproduce after she looked at her LE. Supine>sitting R EOB, HOB flat but using bedrail with R UE support, with SUPERVISION and only verbal cuing for sequencing of LEs to increase independence. Sitting EOB donned shoes and L LE Matrix Max GRAFO total assist for time management. L squat pivot transfer EOB>w/c with min assist of 1 - requires max cuing to recall education from yesterday regarding head/hips relationship, sufficient anterior trunk lean to place weight in her LEs, and pushing off bed with R hand. Pt reports need to use bathroom. Transported in/out bathroom in w/c for time management. L stand pivot transfer w/c>BSC over toilet using R UE support on grab bar with min assist and max step-by-step cuing for sequencing with manual facilitation to rotate hips. Standing with light min assist/CGA pt able to manage doffing and then donning LB clothing management with increased time for donning and min/mod verbal cuing on technique. Sit<>stand to/from [facility] over toilet with CGA/light min assist. Continent of bladder - pt able to perform anterior and posterior peri-care with only set-up assist and cuing for technique and safety. R stand pivot back to w/c with min assist for balance and continued cuing for sequencing. Sitting in w/c at sink performed hand hygiene with hand-over-hand assist to incorporate L UE into the task. Pt left  seated in w/c with needs in reach and seat belt alarm on in preparation for family meeting.   Session 2: Pt received sitting in w/c with her daughter, Tera, present to initiate hands-on training/education in event pt is to D/C home. Pt agreeable to therapy session. Reinforced education regarding plan for custom wheelchair consult tomorrow at 9:30AM with pt's daughter planning to attend - pt already received Home Measurement sheet from OT to help with assessing wheelchair accessibility of the home. Discussed that if pt is to D/C home it will be at wheelchair level with pt requiring assistance for transfers therefore would need to determine if pt requires use of an AFO to perform transfers safely; otherwise, can utilize a soft ankle brace to protect the ankle joint.   Therapist removed AFO from pt's shoe to practice transfers without it.  Pt had initiated stand pivot transfer training with OT therefore initiated that again; however, noticed that without the AFO pt demonstrates significant L knee hyperextension during stance as well as poor L LE foot clearance and ability to successfully step L LE to complete the transfer.   Therapist educated on option to perform squat pivot transfers and provided daughter with demonstration and education on how to set-up wheelchair and assist pt with this. Pt completed squat pivot transfer with therapist providing light min assist to visually demonstrate and then repeated 2x during session with her daughter's assistance - pt will benefit from additional repeated practice of this transfer because she has difficulty recalling proper R hand placement and head hips relationship with her daughter's assistance.  Trialed progressing  pt to use of RW for stand pivot transfers to determine if this would be more safe and allow pt increased independence and pt demonstrates improved balance and reports feeling more safe using RW compared to no AD when performing this transfer; however,  she continues to have impaired L LE alignment/stepping as noted above with stand pivots and would require AFO for this.  Educated on possible need of hospital bed for D/C home to allow pt to increase her independence and decrease burden of care for bed mobility. Otherwise, educated pt's daughter that pt will need a strong bedrail on R side of bed.  Sit>supine with min assist for L LE management onto bed and then supine>sit with light min assist at this time for trunk upright due to pt reporting back pain. Pt's daughter demonstrated ability to assist pt with this.  Pt/daughter report no specific questions at this time but still need additional hands-on practice/training as well as additional time to prepare home environment if pt D/Cing home to ensure pt safety.  Pt left supine in bed with needs in reach, her daughter present, and NT present to assume care of patient.   Therapy Documentation Precautions:  Precautions Precautions: Fall, Other (comment) Precaution Comments: L hemiplegia, L inattention, R hip and knee pain Restrictions Weight Bearing Restrictions: No   Pain: Session 1: No specific complaints of pain during session - premedicated.  Session 2: Reports increased low back pain with repeat practice of bed mobility tasks. Reports medial R knee pain during one stand pivot transfer requiring seated rest break for pain management. Pt premedicated. Provided seated rest breaks, distraction,and emotional support for pain management during session.   Therapy/Group: Individual Therapy  Ginny Forth , PT, DPT, NCS, CSRS 10/31/2021, 8:22 AM

## 2021-10-31 NOTE — Progress Notes (Signed)
Physical Therapy Session Note  Patient Details  Name: Charlotte Hunter MRN: [id number] Date of Birth: 1955/11/25  Today's Date: 10/31/2021 PT Co-Treatment Time: 1222-1245 (total time with pt: 1222 - 1255) PT Co-Treatment Time Calculation (min): 23 min  Short Term Goals: Week 5:  PT Short Term Goal 1 (Week 5): = to LTGs based on ELOS  Skilled Therapeutic Interventions/Progress Updates:    Pt received sitting in w/c with OT, MD, and SW present for family meeting with plan to discuss D/C planning, pt's CLOF and assistance needs. PT focused on providing pt/family with the following education regarding her functional mobility and PT POC:  - CLOF of min assist for transfers - pt's need for 24hr support at D/C  - pt currently at wheelchair level and not yet safe to ambulate in the home with family assistance - plan for custom wheelchair evaluation tomorrow at 9:30AM with pt's need for a custom wheelchair to allow mod-I wheelchair management and mobility  - need for home to be accessible via wheelchair including ramped entrance   Pt and daughter verbalize understanding with plan to continue pursing SNF placement to allow pt to continue advancing her independence with functional mobility; however, understand that alternate option is to D/C home with pt's daughter providing 24hr support. Planning to have pt's daughter start participating in hands-on education/training in event pt does D/C home.   Pt left seated in w/c with needs in reach and her daughter present.  Therapy Documentation Precautions:  Precautions Precautions: Fall, Other (comment) Precaution Comments: L hemiplegia, L inattention, R hip and knee pain Restrictions Weight Bearing Restrictions: No   Pain: No reports of pain throughout session.  Therapy/Group: Co-Treatment  Antiono Ettinger M Garrin Kirwan 10/31/2021, 12:13 PM

## 2021-11-01 LAB — SARS CORONAVIRUS 2 BY RT PCR: SARS Coronavirus 2 by RT PCR: NEGATIVE

## 2021-11-01 NOTE — Progress Notes (Signed)
Orthopedic Tech Progress Note Patient Details:  Charlotte Hunter 04/18/1955 [id number]  Ortho Devices Type of Ortho Device: Ankle splint Ortho Device/Splint Location: LLE Ortho Device/Splint Interventions: Ordered, Application, Adjustment, Removal   Post Interventions Patient Tolerated: Well Instructions Provided: Adjustment of device  Miko Sirico OTR/L 11/01/2021, 6:17 PM

## 2021-11-01 NOTE — Progress Notes (Signed)
Physical Therapy Session Note  Patient Details  Name: Charlotte Hunter MRN: [id number] Date of Birth: 1956-01-04  Today's Date: 11/01/2021 PT Individual Time: 4580-9983 and 0951 - 1033 PT Individual Time Calculation (min): 18 min  and 42 min and  Today's Date: 11/01/2021 PT Co-Treatment Time: 3825-0539 (with pt from [phone] for co-tx) PT Co-Treatment Time Calculation (min): 16 min  Short Term Goals: Week 5:  PT Short Term Goal 1 (Week 5): = to LTGs based on ELOS  Skilled Therapeutic Interventions/Progress Updates:    Pt received sitting in w/c and agreeable to therapy session with plan for custom manual wheelchair evaluation. Pt agreeable to therapy session. Donned tennis shoes total assist. Pt's daughter arriving during session to contribute to custom wheelchair evaluation consult.  Therapist educated pt on the following regarding custom manual wheelchair: - need for K5 ultra-lightweight manual wheelchair with adjustable axle to allow pt access to the wheel and promote optimal biomechanical alignment for efficient propulsion and prevention of repetitive stress injuries (RSI) - need for a pressure relieving cushion (gel infused foam cushion) that is low maintenance and safe for squat pivot transfers  - need for a contoured back support, tension adjustable, to provide support to her lumbar spine and promote optimal spinal alignment while providing pain management - need for L UE padded arm support that is adjustable to assist with edema management - need for flip-back armrests to allow squat pivot transfers  - need for lower floor to seat height to allow hemi-propulsion   Brandon, ATP from [employer] present during session for wheelchair consult.  Pollyann Glen, COTA present for a portion of the session to co-tx and provide input on custom wheelchair recommendations.  L squat pivot transfer w/c>EOB with light min assist and continued cuing for head/hips relationship, pushing with R UE, and  manual facilitation for proper L LE foot placement.   Sit>supine with min assist for LLE management.   At end of session, pt left supine in bed with needs in reach and bed alarm on.  Therapy Documentation Precautions:  Precautions Precautions: Fall, Other (comment) Precaution Comments: L hemiplegia, L inattention, R hip and knee pain Restrictions Weight Bearing Restrictions: No   Pain:  No reports of pain throughout session.    Therapy/Group: Individual Therapy and Co-Treatment  Ginny Forth , PT, DPT, NCS, CSRS 11/01/2021, 7:56 AM

## 2021-11-01 NOTE — Progress Notes (Signed)
PROGRESS NOTE   Subjective/Complaints:  No issues overnite, daughter visiting SNF this am and getting training  ROS:  Pt denies SOB, abd pain, CP, N/V/C/D, and vision changes    Objective:   No results found. No results for input(s): "WBC", "HGB", "HCT", "PLT" in the last 72 hours.  Recent Labs    10/31/21 0616  NA 139  K 3.6  CL 106  CO2 25  GLUCOSE 111*  BUN 13  CREATININE 0.61  CALCIUM 9.8      Intake/Output Summary (Last 24 hours) at 11/01/2021 0818 Last data filed at 10/31/2021 2100 Gross per 24 hour  Intake 340 ml  Output --  Net 340 ml         Physical Exam: Vital Signs Blood pressure 130/74, pulse 86, temperature 97.9 F (36.6 C), resp. rate 18, height 5' 5.98" (1.676 m), weight 80.6 kg, SpO2 98 %.   General: No acute distress, sitting in bed Mood and affect are appropriate Heart: Regular rate and rhythm no rubs murmurs or extra sounds Lungs: Clear to auscultation, breathing unlabored, no rales or wheezes Abdomen: Positive bowel sounds, soft nontender to palpation, nondistended Extremities: No clubbing, cyanosis, or edema Pain Left arm with elbow and shoulder ROM , no tenderness   Skin: warm, dry, Clean and intact without signs of breakdown Neuro: Cranial nerves II through XII intact, motor strength is 5/5 in right and 0/5 left deltoid, bicep, tricep,2-finger flexors and pronators , hip flexor or, knee extensors, ankle dorsiflexor and plantar flexor.  2-left hip ext and add.  Sensory exam normal to light touch and proprioception in right and No LT sensation left upper and lower extremities.  Negative SLR, pain with Achilles stretching on left.  Musculoskeletal: FROM all 4 extremities. Tender bilat lumbar paraspinal , RIght greater troch    Assessment/Plan: 1. Functional deficits which require 3+ hours per day of interdisciplinary therapy in a comprehensive inpatient rehab  setting. Physiatrist is providing close team supervision and 24 hour management of active medical problems listed below. Physiatrist and rehab team continue to assess barriers to discharge/monitor patient progress toward functional and medical goals  Care Tool:  Bathing    Body parts bathed by patient: Left arm, Chest, Abdomen, Front perineal area, Right upper leg, Left upper leg, Right lower leg, Left lower leg, Face   Body parts bathed by helper: Right arm     Bathing assist Assist Level: Minimal Assistance - Patient > 75%     Upper Body Dressing/Undressing Upper body dressing   What is the patient wearing?: Pull over shirt    Upper body assist Assist Level: Moderate Assistance - Patient 50 - 74%    Lower Body Dressing/Undressing Lower body dressing      What is the patient wearing?: Underwear/pull up, Pants     Lower body assist Assist for lower body dressing: Moderate Assistance - Patient 50 - 74%     Toileting Toileting Toileting Activity did not occur Press photographer and hygiene only): Refused  Toileting assist Assist for toileting: Maximal Assistance - Patient 25 - 49%     Transfers Chair/bed transfer  Transfers assist  Chair/bed transfer activity did not occur: Safety/medical  concerns  Chair/bed transfer assist level: Minimal Assistance - Patient > 75% (squat pivot)     Locomotion Ambulation   Ambulation assist   Ambulation activity did not occur: Safety/medical concerns  Assist level: 2 helpers (mod A of 1 and +2 R HHA min A) Assistive device: Hand held assist Max distance: 75ft   Walk 10 feet activity   Assist  Walk 10 feet activity did not occur: Safety/medical concerns  Assist level: 2 helpers Assistive device: Other (comment) (rail in hallway)   Walk 50 feet activity   Assist Walk 50 feet with 2 turns activity did not occur: Safety/medical concerns         Walk 150 feet activity   Assist Walk 150 feet activity did not  occur: Safety/medical concerns         Walk 10 feet on uneven surface  activity   Assist Walk 10 feet on uneven surfaces activity did not occur: Safety/medical concerns         Wheelchair     Assist Is the patient using a wheelchair?: Yes Type of Wheelchair: Manual Wheelchair activity did not occur: Safety/medical concerns  Wheelchair assist level: Set up assist, Supervision/Verbal cueing Max wheelchair distance: 51ft    Wheelchair 50 feet with 2 turns activity    Assist    Wheelchair 50 feet with 2 turns activity did not occur: Safety/medical concerns       Wheelchair 150 feet activity     Assist  Wheelchair 150 feet activity did not occur: Safety/medical concerns       Blood pressure 130/74, pulse 86, temperature 97.9 F (36.6 C), resp. rate 18, height 5' 5.98" (1.676 m), weight 80.6 kg, SpO2 98 %.  Medical Problem List and Plan: 1. 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  Antithrombotics: -DVT/anticoagulation:  Mechanical: Antiembolism stockings, knee hi  (TED hose) Bilateral lower extremities             -antiplatelet therapy: N/A 3. Pain Management: Tylenol as needed, has burning discomfort on Left side , discomfort LLE likely due to Thalamic stroke, Fibromyalgia add low dose duloxetine- also with some soft tissue pain related to positioning in bed overnite   duloxetine 20 mg  4. Mood/Sleep: Provide emotional support, pt frustrated with dependant role will stop buspirone and monitor              -antipsychotic agents: N/A 5. Neuropsych/cognition: This patient is capable of making decisions on her own behalf. 6. Skin/Wound Care: Routine skin checks 7. Fluids/Electrolytes/Nutrition: Routine in and outs with  follow-up chemistries 8.  Dysphagia.  Dysphagia #3 thin liquids -on regular diet/thin 9.  Hypertension.  Cozaar 50 mg daily, Lopressor 25 mg twice daily.  Monitor with increased mobility Vitals:   10/31/21 2049 11/01/21 0642  BP: (!) 150/74 130/74  Pulse: 76 86  Resp: 18 18  Temp: 97.9 F (36.6 C) 97.9 F (36.6 C)  SpO2: 93% 98%    BP on low side denies dizziness with standing  10.  PAF.  s/p ablation no need to restart Eliquis as per Dr  Sandy Salaam, cardiology  11.  Obesity.  BMI 29.21.  Dietary follow-up 12.  History of recurrent UTI.  Prophylactic Macrodantin 50 mg daily.  Doing ok  13.  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  Constipation ,resolved change miralax to PRN (was BID)   -LBM 7/19 improved 16.  Hot flashes, many years post menopausal, , gabapentin may help but could require a higher dose   Improved on premarin  17.  Episode of CPx 1 resolved, exam has tenderness RIght parasternal, EKG and troponin negative, likely MSK      LOS: 31 days A FACE TO FACE EVALUATION WAS PERFORMED  Charlett Blake 11/01/2021, 8:18 AM

## 2021-11-01 NOTE — Progress Notes (Signed)
Occupational Therapy Session Note  Patient Details  Name: Charlotte Hunter MRN: [id number] Date of Birth: 1955/08/18  Today's Date: 11/01/2021 OT Individual Time:  Session 1 ( co-txt) [id number] Session 2: 1100-1209  Session 3: 4098-1191    Short Term Goals: Week 4:  OT Short Term Goal 1 (Week 4): continue working towards [employer]  Skilled Therapeutic Interventions/Progress Updates:  Session 1: Pt greeted seated in w/c with PT present for w/c eval co txt. OTA contributed to session by discussing recommendation for padded arm troth on LUE that will swing backwards to facilitate independence with squat pivot transfers during toileting tasks. Also decided that custom w/c will not be able to fit through bathroom doorway d/t environment of house, therefore plan to discuss toileting/bathing needs further during family ed session tomorrow. Pt left seated in w/c with PT present continuing w/c eval.           Session 2: Pt greeted supine in bed  agreeable to OT intervention. Pt reports wanting to work on IADLs as pt has a bill that needs to be paid today. Pt completed supine>sit with supervision, set pt up with laptop at side table with education provided on compensatory methods of manipulating bills with LUE. Pt able to pay bill via phone with supervision needing assist only for LUE manipulation skills.    Remainder of session focused on stand pivot transfers with Rw with no AFO. Pt completed transfers with overall MIN A with Rw.  Pt requested to return back to bed with MIN A needing light MIN A to elevate BLEs back to bed. Pt left supine in bed with LPN present.                    Session 3:  Pt greeted supine in bed, pt agreeable to OT intervention.  Pt reports that her daughter did not prefer the SNF options that she visited today, pt reports she feels like she will end up going home, reiterated plan that SNF determination needed to be made by tomorrow with back up plan up DC home Tuesday. Discussed  that if pt does go home pt will have to adapt home such as potentially getting transport chair for bathroom/ using BSC/ using tv tray table for grooming table as pt cannot access her sink from w/c. Pt agreeable, also recommended that pt and daughter begin thinking about how 24/7 assist can be provided, pt agreeable to all education.     Pt completed supine>sit with supervision wit use of bed rail, recommended bed rail for home if pt declines hospital bed.  Donned shoes from EOB with total A for time mgmt, pt completed stand pivot transfer from EOB>w/c with Rw and MINA. Pt transported to sink for hair washing to facilitate practice with directing level of care and demo how pt could adapt her bathing routine at home. Pt agreeable to recommendation and tolerated task well.  Pt left up in w/c with alarm belt activated and all needs within reach.                  Therapy Documentation Precautions:  Precautions Precautions: Fall, Other (comment) Precaution Comments: L hemiplegia, L inattention, R hip and knee pain Restrictions Weight Bearing Restrictions: No   Pain: Session 1: no pain reported during session  Session 2: unrated pain reported in low back, rest breaks, repositioning provided as needed.  Session 3: no pain reported during session    Therapy/Group: Individual Therapy  Barron Schmid 11/01/2021,  3:57 PM

## 2021-11-01 NOTE — Progress Notes (Signed)
Patient ID: Charlotte Hunter, female   DOB: 1955-10-02, 66 y.o.   MRN: [id number]  SW followed up with patient daughter. Daughter is not interested in any of the facilities toured today. Daughter has been informed to be present for patient's family education with the potential of discharge home on Tuesday. Daughter plans to visit other facilities offered and make decision tomorrow. Daughter informed if unable to make a determination of SNF tomorrow team will begin preparing for discharge home Tuesday. Sw will have covering SW to follow up with patient daughter tomorrow.

## 2021-11-02 NOTE — Progress Notes (Signed)
PROGRESS NOTE   Subjective/Complaints:  Daughter touring facilities  ROS:  Pt denies SOB, abd pain, CP, N/V/C/D, and vision changes    Objective:   No results found. No results for input(s): "WBC", "HGB", "HCT", "PLT" in the last 72 hours.  Recent Labs    10/31/21 0616  NA 139  K 3.6  CL 106  CO2 25  GLUCOSE 111*  BUN 13  CREATININE 0.61  CALCIUM 9.8      Intake/Output Summary (Last 24 hours) at 11/02/2021 0817 Last data filed at 11/02/2021 0730 Gross per 24 hour  Intake 740 ml  Output --  Net 740 ml         Physical Exam: Vital Signs Blood pressure 132/70, pulse 78, temperature 97.8 F (36.6 C), resp. rate 17, height 5' 5.98" (1.676 m), weight 80.6 kg, SpO2 94 %.   General: No acute distress, sitting in bed Mood and affect are appropriate Heart: Regular rate and rhythm no rubs murmurs or extra sounds Lungs: Clear to auscultation, breathing unlabored, no rales or wheezes Abdomen: Positive bowel sounds, soft nontender to palpation, nondistended Extremities: No clubbing, cyanosis, or edema Pain Left arm with elbow and shoulder ROM , no tenderness   Skin: warm, dry, Clean and intact without signs of breakdown Neuro: Cranial nerves II through XII intact, motor strength is 5/5 in right and 0/5 left deltoid, bicep, tricep,2-finger flexors and pronators , hip flexor or, knee extensors, ankle dorsiflexor and plantar flexor.  2-left hip ext and add.  Sensory exam normal to light touch and proprioception in right and No LT sensation left upper and lower extremities.  Negative SLR, pain with Achilles stretching on left.  Musculoskeletal: FROM all 4 extremities. Tender bilat lumbar paraspinal , RIght greater troch    Assessment/Plan: 1. Functional deficits which require 3+ hours per day of interdisciplinary therapy in a comprehensive inpatient rehab setting. Physiatrist is providing close team supervision and  24 hour management of active medical problems listed below. Physiatrist and rehab team continue to assess barriers to discharge/monitor patient progress toward functional and medical goals  Care Tool:  Bathing    Body parts bathed by patient: Left arm, Chest, Abdomen, Front perineal area, Right upper leg, Left upper leg, Right lower leg, Left lower leg, Face   Body parts bathed by helper: Right arm     Bathing assist Assist Level: Minimal Assistance - Patient > 75%     Upper Body Dressing/Undressing Upper body dressing   What is the patient wearing?: Pull over shirt    Upper body assist Assist Level: Moderate Assistance - Patient 50 - 74%    Lower Body Dressing/Undressing Lower body dressing      What is the patient wearing?: Underwear/pull up, Pants     Lower body assist Assist for lower body dressing: Moderate Assistance - Patient 50 - 74%     Toileting Toileting Toileting Activity did not occur Landscape architect and hygiene only): Refused  Toileting assist Assist for toileting: Maximal Assistance - Patient 25 - 49%     Transfers Chair/bed transfer  Transfers assist  Chair/bed transfer activity did not occur: Safety/medical concerns  Chair/bed transfer assist level: Minimal Assistance -  Patient > 75% (stand pivot with Rw)     Locomotion Ambulation   Ambulation assist   Ambulation activity did not occur: Safety/medical concerns  Assist level: 2 helpers (mod A of 1 and +2 R HHA min A) Assistive device: Hand held assist Max distance: 52ft   Walk 10 feet activity   Assist  Walk 10 feet activity did not occur: Safety/medical concerns  Assist level: 2 helpers Assistive device: Other (comment) (rail in hallway)   Walk 50 feet activity   Assist Walk 50 feet with 2 turns activity did not occur: Safety/medical concerns         Walk 150 feet activity   Assist Walk 150 feet activity did not occur: Safety/medical concerns         Walk 10  feet on uneven surface  activity   Assist Walk 10 feet on uneven surfaces activity did not occur: Safety/medical concerns         Wheelchair     Assist Is the patient using a wheelchair?: Yes Type of Wheelchair: Manual Wheelchair activity did not occur: Safety/medical concerns  Wheelchair assist level: Set up assist, Supervision/Verbal cueing Max wheelchair distance: 60ft    Wheelchair 50 feet with 2 turns activity    Assist    Wheelchair 50 feet with 2 turns activity did not occur: Safety/medical concerns       Wheelchair 150 feet activity     Assist  Wheelchair 150 feet activity did not occur: Safety/medical concerns       Blood pressure 132/70, pulse 78, temperature 97.8 F (36.6 C), resp. rate 17, height 5' 5.98" (1.676 m), weight 80.6 kg, SpO2 94 %.  Medical Problem List and Plan: 1. 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  Antithrombotics: -DVT/anticoagulation:  Mechanical: Antiembolism stockings, knee hi  (TED hose) Bilateral lower extremities             -antiplatelet therapy: N/A 3. Pain Management: Tylenol as needed, has burning discomfort on Left side , discomfort LLE likely due to Thalamic stroke, Fibromyalgia add low dose duloxetine- also with some soft tissue pain related to positioning in bed overnite   duloxetine 20 mg  4. Mood/Sleep: Provide emotional support, pt frustrated with dependant role will stop buspirone and monitor              -antipsychotic agents: N/A 5. Neuropsych/cognition: This patient is capable of making decisions on her own behalf. 6. Skin/Wound Care: Routine skin checks 7. Fluids/Electrolytes/Nutrition: Routine in  and outs with follow-up chemistries 8.  Dysphagia.  Dysphagia #3 thin liquids -on regular diet/thin 9.  Hypertension.  Cozaar 50 mg daily, Lopressor 25 mg twice daily.  Monitor with increased mobility Vitals:   11/01/21 1948 11/02/21 0610  BP: 135/60 132/70  Pulse: 68 78  Resp: 17 17  Temp: 98.3 F (36.8 C) 97.8 F (36.6 C)  SpO2: 93% 94%    BP on low side denies dizziness with standing  10.  PAF.  s/p ablation no need to restart Eliquis as per Dr  Sandy Salaam, cardiology  11.  Obesity.  BMI 29.21.  Dietary follow-up 12.  History of recurrent UTI.  Prophylactic Macrodantin 50 mg daily.  Doing ok  13.  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  Constipation ,resolved change miralax to PRN (was BID)   -LBM 7/19 improved 16.  Hot flashes, many years post menopausal, , gabapentin may help but could require a higher dose   Improved on premarin  17.  Episode of CPx 1 resolved, exam has tenderness RIght parasternal, EKG and troponin negative, likely MSK      LOS: 32 days A FACE TO FACE EVALUATION WAS PERFORMED  Erick Colace 11/02/2021, 8:17 AM

## 2021-11-02 NOTE — Progress Notes (Addendum)
Patient ID: Charlotte Hunter, female   DOB: [birth year], 66 y.o.   MRN: [id number]  This SW covering for primary SW, Chrisitna Baskerville.   SW met with pt and pt daughter in room to discuss SNF bed selection during PT session. Reports they are still undecided about bed options. Pt dtr states she is not prepared to take her mother home, and expressed frustration with this process. She states having a discharge date would have helped. SW explained there is no d.c date with SNF, however, a SNF bed has not been selected, and will need to move forward with a SNF that has extended a bed since preferred locations due to not have bed availability. SW reiterated SNF placement process, and explained that insurance could potentially come back and request payment as bed offers are being extended and no selection has been made. SW shared will need to make a SNF selection, or will need to move forward with discharge to home on Tuesday. SW will provide Medicare.gov (100 mi radius) SNF list, and sitter list should they choose to  hire private aide upon d/c. SW will make efforts to contact preferred SNFs- [facility] to see if any beds available. SW shared in the event they do not have a bed, a selection will need to be made moving forward with another location.   SW provided SNF list and sitter list.   SW left message for Admissions with [facility] [phone]) and [facility] [phone]) to discuss bed availability.   *SW spoke with Tricia/Admissions with [facility] who reported no bed available at this time. Primary beds are for current residents in the their community.   Loralee Pacas, MSW, [place] Office: [phone] Cell: [phone] Fax: [phone]

## 2021-11-02 NOTE — Progress Notes (Signed)
Occupational Therapy Discharge Summary  Patient Details  Name: Charlotte Hunter MRN: [id number] Date of Birth: [birth year]  Today's Date: 11/02/2021 OT Individual Time: 2023-3435 OT Individual Time Calculation (min): 82 min    Patient has met {NUMBERS 0-12:18577} of {NUMBERS 0-12:18577} long term goals due to {due to:3041651}.  Patient to discharge at overall {LOA:3049010} level.  Patient's care partner {care partner:3041650} to provide the necessary {assistance:3041652} assistance at discharge.    Reasons goals not met: ***  Recommendation:  Patient will benefit from ongoing skilled OT services in {setting:3041680} to continue to advance functional skills in the area of {ADL/iADL:3041649}.  Equipment: {equipment:3041657}  Reasons for discharge: {Reason for discharge:3049018}  Patient/family agrees with progress made and goals achieved: {Pt/Family agree with progress/goals:3049020}  OT Discharge Precautions/Restrictions    General OT Amount of Missed Time: 8 Minutes Vital Signs   Pain   ADL ADL Eating: Set up, Supervision/safety Where Assessed-Eating: Wheelchair Grooming: Setup Where Assessed-Grooming: Wheelchair Upper Body Bathing: Minimal assistance Where Assessed-Upper Body Bathing: Shower Lower Body Bathing: Moderate assistance Where Assessed-Lower Body Bathing: Shower Upper Body Dressing: Minimal assistance Where Assessed-Upper Body Dressing: Wheelchair Lower Body Dressing: Moderate assistance Where Assessed-Lower Body Dressing: Wheelchair Toileting: Moderate assistance Where Assessed-Toileting: Glass blower/designer: Psychiatric nurse Method: Stand pivot (RW) Science writer: Bedside commode, Ambulance person Transfer: Not assessed Social research officer, government: Moderate assistance Social research officer, government Method: Radiographer, therapeutic: Transfer tub bench ADL Comments: Pt requests to remain at bed level due to reports  of lightheadedness. Vitals were taken and were [facility]. At end of session, pt requested to transfer to [facility]. Pt able to participate in all ADLs with moderate - maximal assistance. Vision   Perception    Praxis   Cognition   Sensation   Motor    Mobility     Trunk/Postural Assessment     Balance   Extremity/Trunk Assessment       [name] 11/02/2021, 1:01 PM

## 2021-11-02 NOTE — Progress Notes (Signed)
Physical Therapy Session Note  Patient Details  Name: Charlotte Hunter MRN: [id number] Date of Birth: 26-May-1955  Today's Date: 11/02/2021 PT Individual Time: 1038-1140 PT Individual Time Calculation (min): 62 min   Short Term Goals: Week 4:  PT Short Term Goal 1 (Week 4): = to LTGs based on ELOS PT Short Term Goal 1 - Progress (Week 4): Progressing toward goal Week 5:  PT Short Term Goal 1 (Week 5): = to LTGs based on ELOS  Skilled Therapeutic Interventions/Progress Updates:     Direct handoff of care from OT to start session with pt sitting in w/c. Daughter at bedside for scheduled family training. CSW, Harlan Stains, also at bedside to review DC planning and to discuss SNF possibilities. Lengthy conversation with CSW/PT and family regarding SNF choices, DC process, and need for family to choose facility ASAP. CSW provided family with another list of facilities as well as a list of private pay caregivers/sitters for backup.   Patient and daughter also asking questions for preparing for home. Reviewed the following -LLE ankle splint to be worn during mobiltiy/transfers. (Patient and daughter asking why this was needed, and they wanted the AFO rather than the ankle splint) -wheelchair level only with NO ambulation with family (daughter asking how she can safely ambulate patient at home) -squat pivot vs stand pivot transfers with recommendation of squat pivot for pt safety -using BSC for toileting at home until HHPT/OT further evaluate and assist training at home.  Remainder of session focused on hands on training with emphasis on functional transfers. Pt completed x2 squat<>pivot transfers with PT from w/c to EOB with minA, while educating daughter on positioning, setup, and technique. Daughter completed the same transfer, initally trying to complete squat pivot by standing on her L side rather tha ndirectly in front of her. PT providing light minA for both of her transfers for safety, primarily to  assist with safely guiding her hips to surfaces.   Daughter asking about transfers to [facility] and showed her how to do these in similar manner. Daughter asking if stand pivot would be more beneficial - explained and demonstrated stand pivot and increased assist needed for lateral weight shifting and managing her paretic RLE during transfer. Due to time constraints, unable to allow daughter to assist with this.   Daughter assisted with removing ankle splint/shoes and providing minA for sit>supine into hospital bed. Pt repositioned to comfort. All needs met with daughter still at bedside.    Therapy Documentation Precautions:  Precautions Precautions: Fall, Other (comment) Precaution Comments: L hemiplegia, L inattention, R hip and knee pain Restrictions Weight Bearing Restrictions: No General:     Therapy/Group: Individual Therapy  [name] 11/02/2021, 7:38 AM

## 2021-11-02 NOTE — Progress Notes (Signed)
Occupational Therapy Session Note  Patient Details  Name: Charlotte Hunter MRN: [id number] Date of Birth: [birth year]  Today's Date: 11/02/2021 OT Individual Time: 2010-0712 OT Individual Time Calculation (min): 82 min  General OT Amount of Missed Time: 8 Minutes ( therapist running late from required COVID meeting)  Short Term Goals: Week 4:  OT Short Term Goal 1 (Week 4): continue working towards [employer]  Skilled Therapeutic Interventions/Progress Updates:  Pt greeted supine in bed, pt    agreeable to OT intervention. Pt completed supine>sit from flat [facility] with supervision with use of bed rail on R side, education provided on recommendation for bed rail at home.  Donned elastic shoe laces in pts sneakers to facilitate increased independence with LB dressing, pt able to don R shoe with set- up assist but will need assist with L shoe d/t L ankle brace.  Pts daughter arrived for family ed. Began session discussing plan for bathroom as it is likely that pts w/c will not be able to fit through doorway. Discussed option of using 16 inch transport chair to allow pt to enter through doorway and then stand pivot to toilet, tried stand pivot to toilet in bathroom going to L side with Rw with pt needing MIN A with MOD verbal cues for sequence  pivoting. Daughter reports that would be the set- up at home and daughter concerned about having enough space for transport chair, RW, daughter and pt to all be in the bathroom. This OTA recommended that pt use BSC vs going into bathroom. Daughter hesitant about this plan.  Briefly discussed shower set up at home with again recommendation of using transport chair to enter bathroom and then completing squat pivot from transport chair to TTB. Visually demo'ed transfer but ran out of time to actually practice transfer. Encouraged pt and daughter to make a HH goal of completing shower/ toilet transfers if they do end up going home as this OTA feels both pt and daughter or  concerned about bathroom transfers. Pt left seated in w/c with PT and SW entering room.             Therapy Documentation Precautions:  Precautions Precautions: Fall, Other (comment) Precaution Comments: L hemiplegia, L inattention, R hip and knee pain Restrictions Weight Bearing Restrictions: No  Pain: no pain reported during session     Therapy/Group: Individual Therapy  Pollyann Glen [facility] 11/02/2021, 12:09 PM

## 2021-11-03 DIAGNOSIS — I480 Paroxysmal atrial fibrillation: Secondary | ICD-10-CM

## 2021-11-03 DIAGNOSIS — I69391 Dysphagia following cerebral infarction: Secondary | ICD-10-CM

## 2021-11-03 NOTE — Progress Notes (Signed)
Occupational Therapy Session Note  Patient Details  Name: Charlotte Hunter MRN: [id number] Date of Birth: 1955-12-25  Today's Date: 11/03/2021 OT Individual Time: 1100-1205 OT Individual Time Calculation (min): 65 min    Short Term Goals: Week 1:  OT Short Term Goal 1 (Week 1): Pt will maintain static sitting balance EOB with no more than min A for >5 min. OT Short Term Goal 1 - Progress (Week 1): Met OT Short Term Goal 2 (Week 1): Pt will complete >2 grooming tasks seated at sink with S. OT Short Term Goal 2 - Progress (Week 1): Progressing toward goal OT Short Term Goal 3 (Week 1): Pt will don shirt in supported sitting mod A. OT Short Term Goal 3 - Progress (Week 1): Progressing toward goal OT Short Term Goal 4 (Week 1): Pt will complete toilet transfer and LRAD with max A. OT Short Term Goal 4 - Progress (Week 1): Met Week 2:  OT Short Term Goal 1 (Week 2): pt will engage in one grooming tasks from w/c at sink with MOD A OT Short Term Goal 1 - Progress (Week 2): Met OT Short Term Goal 2 (Week 2): pt will completed 1/3 toileting tasks with MAXA OT Short Term Goal 2 - Progress (Week 2): Met OT Short Term Goal 3 (Week 2): pt will complete sit>stand with MOD A OT Short Term Goal 3 - Progress (Week 2): Met Week 3:  OT Short Term Goal 1 (Week 3): STG= LTG  Skilled Therapeutic Interventions/Progress Updates:    1:1 Pt received from PT in the w/c. Focus on self care retraining at shower level. Pt transferred from w/c to [facility] over the commode with RW with min A with max multimodal cues for safety. Pt requested A to pull down pants and clean backside for thoroughness. Pt transferred (to the right) back into the w/c with contact guard with mod cues squat pivot. Performed stand pivot in and out of shower stall using tub bench with grab bar with min A with again instructional cues. Assisted with doffing LB clothing including shoe/ AFO. Pt was able to shower sit to stand with min A. Pt received A to  wash left foot, buttocks and right UE. Pt required extra time to orient her clothing prior to donning but was able to identify and correct errors. Cue to use mirror to make sure shirt is down over trunk due to decr sensation. Pt was able to thread underwear and pants with slight min A over her left foot (cant performed figure 4 position with left LE due to right knee pain). Pt performed sit to stand with contact guard and was able to pull up underwear and pants with steadying A and attention to right knee terminal extension. Pt able to perform oral care with setup. Pt left sitting in the w/c in prep for lunch with seat belt alarm in place.   Therapy Documentation Precautions:  Precautions Precautions: Fall, Other (comment) Precaution Comments: L hemiplegia, L inattention, R hip and knee pain Restrictions Weight Bearing Restrictions: No  Pain: No c/o pain in session    Therapy/Group: Individual Therapy  Willeen Cass [facility] 11/03/2021, 12:36 PM

## 2021-11-03 NOTE — Progress Notes (Signed)
Physical Therapy Session Note  Patient Details  Name: Charlotte Hunter MRN: [id number] Date of Birth: 10/08/1955  Today's Date: 11/03/2021 PT Individual Time: 1000-1102 PT Individual Time Calculation (min): 62 min   Short Term Goals: Week 4:  PT Short Term Goal 1 (Week 4): = to LTGs based on ELOS PT Short Term Goal 1 - Progress (Week 4): Progressing toward goal Week 5:  PT Short Term Goal 1 (Week 5): = to LTGs based on ELOS  Skilled Therapeutic Interventions/Progress Updates:  Patient supine in bed on entrance to room. States that she has been up in the loaner w/c all morning and it was very comfortable. Patient alert and agreeable to PT session. Socks, L ankle brace and shoes donned MaxA for time.   Patient with L shoulder pain complaint at start of session. Alleviates with repositioning and movement.   Therapeutic Activity: Bed Mobility: Patient performed supine --> sit with MinA/ CGA for UB d/t pain in L shoulder. VC/ tc required for technique, sequencing, L knee flexion to assist with bringing L hip around toward EOB, and effort. At end of session pt requires minA for bringing BLE to bed surface. Assists with repositioning toward HOB with minA.  Transfers: initial attempt for squat pivot to L from bed to w/c with ModA d/t L shoulder pain and LE positioning. Patient performed sit<>stand transfers with close supervision and CGA with RUE support either on bedrail or RW. Sit<>stand using no UE support with light MinA and improving to light CGA. Stand pivot transfers throughout session with HHA and CGA support to hips with intermittent need for cueing sequence of pivot stepping. Stand pivot to R using bedrail with pt able to perform with close supervision and no cueing for technique.   Wheelchair Mobility:  Patient propelled wheelchair about room collecting items needed for upcoming shower with OT following the PT session. Items collected from drawers, tray table, and bedside table. Propelled  with RUE and RLE with good ability to turn chair and avoid obstacles.   Gait Training:  Patient ambulated 8 ft using RW with L hand saddle splint and MinA/ CGA. LLE advanced with no knee flexion. Shift balance well with good balance maintenance. Provided vc/ tc for increasing knee flexion.  Neuromuscular Re-ed: NMR facilitated during session with focus on L knee flexion to improve activation during swing phase of gait. Pt guided in use of w/c and cueing pt to use LLE to pull w/c forward. Pt unable to produce enough strength to propel w/c without assist. Instructed to use BLE together at same time to heel strike and pull chair forward with Bil knee flexion. Improvement noted in HS with light activation in Bil pull chair.  Requires ModA to advance chair with LLE alone. Significant vc for stepping up and out with LLE throughout. NMR performed for improvements in motor control and coordination, balance, sequencing, judgement, and self confidence/ efficacy in performing all aspects of mobility at highest level of independence.   Patient seated upright in w/c with handoff to OT for shower session.   Therapy Documentation Precautions:  Precautions Precautions: Fall, Other (comment) Precaution Comments: L hemiplegia, L inattention, R hip and knee pain Restrictions Weight Bearing Restrictions: No General:   Vital Signs:   Pain:  Shoulder pain complaint subsides throughout session.   Therapy/Group: Individual Therapy  Loel Dubonnet PT, DPT, CSRS 11/03/2021, 6:25 PM

## 2021-11-03 NOTE — Progress Notes (Signed)
PROGRESS NOTE   Subjective/Complaints:  No new complaints today.   ROS: Patient denies fever, rash, sore throat, blurred vision, dizziness, nausea, vomiting, diarrhea, cough, shortness of breath or chest pain, joint or back/neck pain, headache, or mood change.    Objective:   No results found. No results for input(s): "WBC", "HGB", "HCT", "PLT" in the last 72 hours.  No results for input(s): "NA", "K", "CL", "CO2", "GLUCOSE", "BUN", "CREATININE", "CALCIUM" in the last 72 hours.  No intake or output data in the 24 hours ending 11/03/21 1040      Physical Exam: Vital Signs Blood pressure 125/62, pulse 73, temperature 97.8 F (36.6 C), resp. rate 16, height 5' 5.98" (1.676 m), weight 80.6 kg, SpO2 92 %.   Constitutional: No distress . Vital signs reviewed. HEENT: NCAT, EOMI, oral membranes moist Neck: supple Cardiovascular: RRR without murmur. No JVD    Respiratory/Chest: CTA Bilaterally without wheezes or rales. Normal effort    GI/Abdomen: BS +, non-tender, non-distended Ext: no clubbing, cyanosis, or edema Psych: pleasant and cooperative  Skin: warm, dry, Clean and intact without signs of breakdown Neuro: Cranial nerves II through XII intact, motor strength is 5/5 in right and 0/5 left deltoid, bicep, tricep,2-finger flexors and pronators , hip flexor or, knee extensors, ankle dorsiflexor and plantar flexor.  2-left hip ext and add--stable exam.  Sensory exam normal to light touch and proprioception in right and No LT sensation left upper and lower extremities.  Negative SLR, pain with Achilles stretching on left.  Musculoskeletal: FROM all 4 extremities. Tender bilat lumbar paraspinal , RIght greater troch, left shoulder/elbow discomfort   Assessment/Plan: 1. Functional deficits which require 3+ hours per day of interdisciplinary therapy in a comprehensive inpatient rehab setting. Physiatrist is providing close team  supervision and 24 hour management of active medical problems listed below. Physiatrist and rehab team continue to assess barriers to discharge/monitor patient progress toward functional and medical goals  Care Tool:  Bathing    Body parts bathed by patient: Left arm, Chest, Abdomen, Front perineal area, Right upper leg, Left upper leg, Right lower leg, Left lower leg, Face   Body parts bathed by helper: Right arm     Bathing assist Assist Level: Minimal Assistance - Patient > 75%     Upper Body Dressing/Undressing Upper body dressing   What is the patient wearing?: Pull over shirt    Upper body assist Assist Level: Moderate Assistance - Patient 50 - 74%    Lower Body Dressing/Undressing Lower body dressing      What is the patient wearing?: Underwear/pull up, Pants     Lower body assist Assist for lower body dressing: Moderate Assistance - Patient 50 - 74%     Toileting Toileting Toileting Activity did not occur Press photographer and hygiene only): Refused  Toileting assist Assist for toileting: Maximal Assistance - Patient 25 - 49%     Transfers Chair/bed transfer  Transfers assist  Chair/bed transfer activity did not occur: Safety/medical concerns  Chair/bed transfer assist level: Minimal Assistance - Patient > 75% (stand pivot with Rw)     Locomotion Ambulation   Ambulation assist   Ambulation activity did not occur: Safety/medical  concerns  Assist level: 2 helpers (mod A of 1 and +2 R HHA min A) Assistive device: Hand held assist Max distance: 78ft   Walk 10 feet activity   Assist  Walk 10 feet activity did not occur: Safety/medical concerns  Assist level: 2 helpers Assistive device: Other (comment) (rail in hallway)   Walk 50 feet activity   Assist Walk 50 feet with 2 turns activity did not occur: Safety/medical concerns         Walk 150 feet activity   Assist Walk 150 feet activity did not occur: Safety/medical concerns          Walk 10 feet on uneven surface  activity   Assist Walk 10 feet on uneven surfaces activity did not occur: Safety/medical concerns         Wheelchair     Assist Is the patient using a wheelchair?: Yes Type of Wheelchair: Manual Wheelchair activity did not occur: Safety/medical concerns  Wheelchair assist level: Set up assist, Supervision/Verbal cueing Max wheelchair distance: 21ft    Wheelchair 50 feet with 2 turns activity    Assist    Wheelchair 50 feet with 2 turns activity did not occur: Safety/medical concerns       Wheelchair 150 feet activity     Assist  Wheelchair 150 feet activity did not occur: Safety/medical concerns       Blood pressure 125/62, pulse 73, temperature 97.8 F (36.6 C), resp. rate 16, height 5' 5.98" (1.676 m), weight 80.6 kg, SpO2 92 %.  Medical Problem List and Plan: 1. 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  Antithrombotics: -DVT/anticoagulation:  Mechanical: Antiembolism stockings, knee hi  (TED hose) Bilateral lower extremities             -antiplatelet therapy: N/A 3. Pain Management: Tylenol as needed, has burning discomfort on Left side , discomfort LLE likely due to Thalamic stroke, Fibromyalgia added low dose duloxetine 20mg  qd- also with some soft tissue pain related to positioning in bed overnite      4. Mood/Sleep: Provide emotional support, pt frustrated with dependant role will stop buspirone and monitor              -antipsychotic agents: N/A 5. Neuropsych/cognition: This patient is capable of making decisions on her own behalf. 6. Skin/Wound Care: Routine skin checks 7.  Fluids/Electrolytes/Nutrition: Routine in and outs with follow-up chemistries 8.  Dysphagia.  -advanced to regular diet/thin 9.  Hypertension.  Cozaar 50 mg daily, Lopressor 25 mg twice daily.  Monitor with increased mobility Vitals:   11/02/21 2025 11/03/21 0546  BP: (!) 144/60 125/62  Pulse: 69 73  Resp: 20 16  Temp: 97.7 F (36.5 C) 97.8 F (36.6 C)  SpO2: 93% 92%    BP controlled 7/22  10.  PAF.  s/p ablation no need to restart Eliquis as per Dr  8/22, cardiology  11.  Obesity.  BMI 29.21.  Dietary follow-up 12.  History of recurrent UTI.  Prophylactic Macrodantin 50 mg daily.  Doing ok  13.  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  Constipation ,resolved change miralax to PRN (was BID)   -LBM 7/22   16.  Hot flashes, many years post menopausal, , gabapentin may help but could require a higher dose   Improved on premarin  17.  Episode of CPx 1 resolved, exam has tenderness RIght parasternal, EKG and troponin negative, likely MSK      LOS: 33 days A FACE TO FACE EVALUATION WAS PERFORMED  Meredith Staggers 11/03/2021, 10:40 AM

## 2021-11-03 NOTE — Progress Notes (Signed)
Physical Therapy Session Note  Patient Details  Name: Charlotte Hunter MRN: [id number] Date of Birth: 1955/06/20  Today's Date: 11/02/2021 PT Individual Time:  1517-1630  PT Individual Time Calculation (min): 73 min  Short Term Goals: Week 3:  PT Short Term Goal 1 (Week 3): Pt will perform supine<>sit with min assist of 1 using bedrail PT Short Term Goal 1 - Progress (Week 3): Progressing toward goal PT Short Term Goal 2 (Week 3): Pt will perform sit<>stand transfers with min assist of 1 PT Short Term Goal 2 - Progress (Week 3): Progressing toward goal PT Short Term Goal 3 (Week 3): Pt will perform bed<>chair transfers with min assist of 1 PT Short Term Goal 3 - Progress (Week 3): Progressing toward goal PT Short Term Goal 4 (Week 3): Pt wil ambulate at least 29ft using LRAD with mod assist of 1 and +2 for safety PT Short Term Goal 4 - Progress (Week 3): Met PT Short Term Goal 5 (Week 3): Pt will perform at least 74ft of wheelchair mobility with min assist PT Short Term Goal 5 - Progress (Week 3): Progressing toward goal Week 4:  PT Short Term Goal 1 (Week 4): = to LTGs based on ELOS PT Short Term Goal 1 - Progress (Week 4): Progressing toward goal  Skilled Therapeutic Interventions/Progress Updates:  Patient supine in bed on entrance to room. Patient alert and agreeable to PT session but relates fatigue.  Patient with minimal pain complaint throughout session.  Therapeutic Activity: Bed Mobility: Patient performed supine --> sit with supervision using bedrail for support. VC/ tc required for technique and effort. Return to supine at end of session with MinA for bringing BLE to bed surface.  Transfers: Patient performed sit<>stand  with minA and stand pivot transfers throughout session with Min/ ModA for sequencing steps, foot placement, balance. Provided verbal cues for pivot stepping.  Pt also performs squat pivot transfer back into bed d/t fatigue. Is able to perform using bedrail  with CGA. Pt relates having bedrail for use at home.    Wheelchair Mobility:  Loaner w/c delivered by [employer], [employer]. Pt provided with education re: positioning and parts mgmt. W/c slightly too tall for pt and tech able to adjust height of front casters one level d/t wheel size. Request for smaller wheel size for decreased angle of front of w/c to improve pt's foot contact with ground.   Patient propelled wheelchair 40 feet with supervision using R toe to steer chair and propel with RUE.   Neuromuscular Re-ed: NMR facilitated during session with focus on standing balance. Pt guided in stance with focus on ankle mobility and balance strategy in reactive balance against minimal perturbations. NMR performed for improvements in motor control and coordination, balance, sequencing, judgement, and self confidence/ efficacy in performing all aspects of mobility at highest level of independence.   Patient requires extensive setup of pillows for positioning and is supine  in bed at end of session with brakes locked, bed alarm set, and all needs within reach.   Therapy Documentation Precautions:  Precautions Precautions: Fall, Other (comment) Precaution Comments: L hemiplegia, L inattention, R hip and knee pain Restrictions Weight Bearing Restrictions: No General:   Vital Signs:  Pain:  No pain complaint this day. Discomfort noted with loaner w/c initially and chair tech addressed issues.   Therapy/Group: Individual Therapy  [name] PT, DPT, CSRS 11/02/2021, 4:46 PM

## 2021-11-04 LAB — SARS CORONAVIRUS 2 BY RT PCR: SARS Coronavirus 2 by RT PCR: NEGATIVE

## 2021-11-04 NOTE — Progress Notes (Signed)
Occupational Therapy Session Note  Patient Details  Name: Charlotte Hunter MRN: [id number] Date of Birth: 1955/07/23  Today's Date: 11/04/2021 OT Individual Time: 1100-1200 OT Individual Time Calculation (min): 60 min    Short Term Goals: Week 3:  OT Short Term Goal 1 (Week 3): STG= LTG  Skilled Therapeutic Interventions/Progress Updates:    Upon OT arrival, pt semi recumbent in bed and reports R hip pain 5/10. Pt agreeable to OT treatment session. Treatment intervention with a focus on self care retraining, standing balance, standing tolerance, and NMR. Pt completes supine to sit transfer with Mod A and performs sit to stand transfer with sara stedy with CGA. Pt was transported to bathroom via sara stedy and performs toilet transfer with CGA and toileting with Min A for peri care. Pt was transported in front of sink and stands using sara stedy with CGA to perform hand hygiene. Pt demonstrates good control during transfers. Pt was transported back to bed and completes estim to  L UE to facilitate finger extension at [facility] for 12 minutes with good muscle activation. Discussed progress with sensation return and ROM while seated EOB independently. Pt doffs shirt independently and requests assist to doff bra with Min A. Shirt was donned with assist from therapy d/t time constraints and returns to supine with Mod A. Pt was repositioned in bed with Min A and left with all needs met and safety measures in place.   Therapy Documentation Precautions:  Precautions Precautions: Fall, Other (comment) Precaution Comments: L hemiplegia, L inattention, R hip and knee pain Restrictions Weight Bearing Restrictions: No   Therapy/Group: Individual Therapy  Marvetta Gibbons 11/04/2021, 12:17 PM

## 2021-11-04 NOTE — Progress Notes (Signed)
PROGRESS NOTE   Subjective/Complaints:  Pt doing well. No new complaints today. Experienced a deep "itch down to my muscle" in left abdomen---feels that sensation is coming back  ROS: Patient denies fever, rash, sore throat, blurred vision, dizziness, nausea, vomiting, diarrhea, cough, shortness of breath or chest pain, joint or back/neck pain, headache, or mood change.     Objective:   No results found. No results for input(s): "WBC", "HGB", "HCT", "PLT" in the last 72 hours.  No results for input(s): "NA", "K", "CL", "CO2", "GLUCOSE", "BUN", "CREATININE", "CALCIUM" in the last 72 hours.   Intake/Output Summary (Last 24 hours) at 11/04/2021 1037 Last data filed at 11/04/2021 0823 Gross per 24 hour  Intake 680 ml  Output --  Net 680 ml        Physical Exam: Vital Signs Blood pressure 121/74, pulse 78, temperature 98.5 F (36.9 C), resp. rate 18, height 5' 5.98" (1.676 m), weight 80.6 kg, SpO2 94 %.   Constitutional: No distress . Vital signs reviewed. HEENT: NCAT, EOMI, oral membranes moist Neck: supple Cardiovascular: RRR without murmur. No JVD    Respiratory/Chest: CTA Bilaterally without wheezes or rales. Normal effort    GI/Abdomen: BS +, non-tender, non-distended Ext: no clubbing, cyanosis, or edema Psych: pleasant and cooperative  Skin: warm, dry, Clean and intact without signs of breakdown Neuro: Cranial nerves II through XII intact, motor strength is 5/5 in right and 0/5 left deltoid, bicep, tricep,2-finger flexors and pronators , hip flexor or, knee extensors, ankle dorsiflexor and plantar flexor.  2-left hip ext and add--stable exam.  Sensory exam normal to light touch and proprioception in right and impaired tr/2 LT sensation left upper and lower extremities.  .  Musculoskeletal: FROM all 4 extremities. Tender bilat lumbar paraspinal , RIght greater troch, left shoulder/elbow discomfort  present   Assessment/Plan: 1. Functional deficits which require 3+ hours per day of interdisciplinary therapy in a comprehensive inpatient rehab setting. Physiatrist is providing close team supervision and 24 hour management of active medical problems listed below. Physiatrist and rehab team continue to assess barriers to discharge/monitor patient progress toward functional and medical goals  Care Tool:  Bathing    Body parts bathed by patient: Left arm, Chest, Abdomen, Front perineal area, Right upper leg, Left upper leg, Right lower leg, Face   Body parts bathed by helper: Left lower leg, Buttocks, Right arm     Bathing assist Assist Level: Minimal Assistance - Patient > 75%     Upper Body Dressing/Undressing Upper body dressing   What is the patient wearing?: Pull over shirt    Upper body assist Assist Level: Contact Guard/Touching assist    Lower Body Dressing/Undressing Lower body dressing      What is the patient wearing?: Underwear/pull up, Pants     Lower body assist Assist for lower body dressing: Minimal Assistance - Patient > 75%     Toileting Toileting Toileting Activity did not occur Press photographer and hygiene only): Refused  Toileting assist Assist for toileting: Moderate Assistance - Patient 50 - 74%     Transfers Chair/bed transfer  Transfers assist  Chair/bed transfer activity did not occur: Safety/medical concerns  Chair/bed transfer  assist level: Minimal Assistance - Patient > 75%     Locomotion Ambulation   Ambulation assist   Ambulation activity did not occur: Safety/medical concerns  Assist level: 2 helpers (mod A of 1 and +2 R HHA min A) Assistive device: Hand held assist Max distance: 23ft   Walk 10 feet activity   Assist  Walk 10 feet activity did not occur: Safety/medical concerns  Assist level: 2 helpers Assistive device: Other (comment) (rail in hallway)   Walk 50 feet activity   Assist Walk 50 feet with 2  turns activity did not occur: Safety/medical concerns         Walk 150 feet activity   Assist Walk 150 feet activity did not occur: Safety/medical concerns         Walk 10 feet on uneven surface  activity   Assist Walk 10 feet on uneven surfaces activity did not occur: Safety/medical concerns         Wheelchair     Assist Is the patient using a wheelchair?: Yes Type of Wheelchair: Manual Wheelchair activity did not occur: Safety/medical concerns  Wheelchair assist level: Set up assist, Supervision/Verbal cueing Max wheelchair distance: 48ft    Wheelchair 50 feet with 2 turns activity    Assist    Wheelchair 50 feet with 2 turns activity did not occur: Safety/medical concerns       Wheelchair 150 feet activity     Assist  Wheelchair 150 feet activity did not occur: Safety/medical concerns       Blood pressure 121/74, pulse 78, temperature 98.5 F (36.9 C), resp. rate 18, height 5' 5.98" (1.676 m), weight 80.6 kg, SpO2 94 %.  Medical Problem List and Plan: 1. 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  Antithrombotics: -DVT/anticoagulation:  Mechanical: Antiembolism stockings, knee hi  (TED hose) Bilateral lower extremities             -antiplatelet therapy: N/A 3. Pain Management: Tylenol as needed, has burning discomfort on Left side , discomfort LLE likely due to Thalamic stroke, -Fibromyalgia: added low dose duloxetine 20mg  qd- also with some soft tissue pain related to positioning in bed overnite      4. Mood/Sleep: Provide emotional support, pt frustrated with dependant role will stop buspirone and monitor               -antipsychotic agents: N/A 5. Neuropsych/cognition: This patient is capable of making decisions on her own behalf. 6. Skin/Wound Care: Routine skin checks 7. Fluids/Electrolytes/Nutrition: Routine in and outs with follow-up chemistries 8.  Dysphagia.  -advanced to regular diet/thin 9.  Hypertension.  Cozaar 50 mg daily, Lopressor 25 mg twice daily.  Monitor with increased mobility Vitals:   11/03/21 1938 11/04/21 0517  BP: (!) 129/59 121/74  Pulse: 72 78  Resp: 18 18  Temp: 98.4 F (36.9 C) 98.5 F (36.9 C)  SpO2: 91% 94%    BP controlled 7/23  10.  PAF.  s/p ablation no need to restart Eliquis as per Dr  Casper Harrison, cardiology  11.  Obesity.  BMI 29.21.  Dietary follow-up 12.  History of recurrent UTI.  Prophylactic Macrodantin 50 mg daily.  Doing ok  13.  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  Constipation ,resolved change miralax to PRN (was BID)   -LBM 7/22   16.  Hot flashes, many years post menopausal, , gabapentin may help but could require a higher dose   Improved on premarin  17.  Episode of CPx 1 resolved, exam has tenderness RIght parasternal, EKG and troponin negative, likely MSK      LOS: 34 days A FACE TO FACE EVALUATION WAS PERFORMED  Ranelle Oyster 11/04/2021, 10:37 AM

## 2021-11-04 NOTE — Progress Notes (Signed)
Physical Therapy Session Note  Patient Details  Name: Charlotte Hunter MRN: [id number] Date of Birth: May 06, 1955  Today's Date: 11/04/2021 PT Individual Time: 0758-0859 PT Individual Time Calculation (min): 61 min   Short Term Goals: Week 4:  PT Short Term Goal 1 (Week 4): = to LTGs based on ELOS PT Short Term Goal 1 - Progress (Week 4): Progressing toward goal  Skilled Therapeutic Interventions/Progress Updates:    Chart reviewed and pt agreeable to therapy. Pt received seated in WC with no c/o pain. Also of note, pt received with RN in room administering medication. Session focused on activities to address self-care in the home with a caregiver and functional transfers to support home access. Pt initiated session with donning of bra and shirt using ModA of therapist. Pt was able to give clear instructions to therapist demonstrating ability to guide caregiver in safe donning to protect LUE. Pt required total A for donning of shoes with GRAFO. Pt then completed 219ft of WC propulsion to therapy gym with MinA. In therapy gym pt completed 3x sit to stand with ModA and VC for sequencing. Pt was able to maintain prolonged stand of 2 mins for each round. Pt then returned to room with same MinA for 261ft of WC propulsion. In the room, pt demonstrated ability to set up and complete SPT from WC>bed with CGA. At end of session, pt was left semi-reclined in bed with alarm engaged, nurse call bell and all needs in reach.    Therapy Documentation Precautions:  Precautions Precautions: Fall, Other (comment) Precaution Comments: L hemiplegia, L inattention, R hip and knee pain Restrictions Weight Bearing Restrictions: No     Therapy/Group: Individual Therapy  Dionne Milo, PT, DPT 11/04/2021, 12:03 PM

## 2021-11-05 DIAGNOSIS — R1312 Dysphagia, oropharyngeal phase: Secondary | ICD-10-CM

## 2021-11-05 NOTE — Progress Notes (Addendum)
Patient ID: Charlotte Hunter, female   DOB: 1956-02-18, 66 y.o.   MRN: [id number]  Sw made attempt to call patient daughter to discuss d/c plan. Sw will spoke with patient and wait for follow up from daughter.   Sw received follow up e-mail from patient daughter informing SW that she is receiving my phone calls but she would like to speak with Victorino Dike B in reference to COVID before communicating patient d/c plan. Sw will have Victorino Dike follow up with patient daughter.

## 2021-11-05 NOTE — Progress Notes (Signed)
Physical Therapy Session Note  Patient Details  Name: Charlotte Hunter MRN: [id number] Date of Birth: Jun 17, 1955  Today's Date: 11/05/2021 PT Individual Time: 9937-1696 + 1300-1415 PT Individual Time Calculation (min): 60 min + 75 min   Short Term Goals: Week 5:  PT Short Term Goal 1 (Week 5): = to LTGs based on ELOS  Skilled Therapeutic Interventions/Progress Updates:      1st session: Pt sitting in w/c to start - agreeable to PT tx. Lengthy discussion to start re: DC planning as pt is scheduled to DC tomorrow due to no SNF plan per notes or patient. Pt reports that the plan is now to DC home. Pt also reports her daughter has been unable to visit her 2/2 COVID restrictions. She reports she will not go home until her daughter has had "thorough" training. Pt asks for picture of hand held shower in her bathroom which was done and sent to her daughter to guide her in purchasing. She also was made aware of the color switch for her w/c that would cost extra, pt requetsing to remain with the purple color for custom chair - AP notified. Pt wearing L GRAFO on arrival - spent time discussing recommendation for ankle splint rather than AFO due to reasons discussed on Friday - pt agreeable to switch to ankle splint and this was done for her. Pt was then transported in w/c to day room rehab gym. Squat<>pivot transfer to mat table with minA, towards her weaker L side. 1x5 sit<>stands with no AD and minA with L knee blocked (no buckling observed). 2x10 standing marching on R to emphasize knee stability during stance phase on L - minA for stability with RUE support to back of chair. Returned to w/c via minA squat pivot transfer. Returned to her room and she remained seated in w/c with all needs met, 1/2 lap tray supporting RUE. Call bell in lap.    2nd session: Pt sitting in w/c to start. Reports pain and requesting voltaren gel be applied to her R knee and R hip - RN notified. Lengthy discussion to start  regarding DC planning as it remains unknown what the plan is. Encouraged patient to call daughter to discuss with PT facilitating conversation. Per daughter, plan for telephone meeting with Nursing Director to discuss options and unable to confirm plan until this is done. Continued to treat as though she is discharging soon.   Transported in w/c to ortho rehab gym. Focused on car transfers to start despite education on medical transport home with car transfer focus via [facility] therapies. Car transfer completed with modA using car simulator - assist primarily required for stand<>pivot (car is too high to complete squat pivot) and for BLE management in/out of car. Pt reports she has a gravel driveway and anticipate this being more difficult with her own car, recommended to defer until [facility] therapies trained daughter at home.   Focused remainder of session on blocked practice squat<>pivot transfers and sit>supine transitions to mat table to simulate bed mobility. Pt completing squat<>pivot transfers with as little as CGA towards both directions with min cues for setup and sequencing. She required only minA for both sitting to supine and supine to sitting, primarily for BLE management and for trunk support. Difficulty for her to get on her R elbow during supine to sitting - daughter getting bed rail for home use to assist with this.  Returned to her room and assisted to bed with CGA squat<>pivot transfer with use of bed rail.  Once EOB, pt requesting assistance to use bathroom. NT called for handoff of care due to time constraints. All needs met with call bell in reach.    Therapy Documentation Precautions:  Precautions Precautions: Fall, Other (comment) Precaution Comments: L hemiplegia, L inattention, R hip and knee pain Restrictions Weight Bearing Restrictions: No General:   Mobility:   Locomotion :    Trunk/Postural Assessment :    Balance:   Exercises:   Other Treatments:       Therapy/Group: Individual Therapy  [name] 11/05/2021, 7:42 AM

## 2021-11-05 NOTE — Progress Notes (Signed)
Occupational Therapy Session Note  Patient Details  Name: Charlotte Hunter MRN: [id number] Date of Birth: 12/11/1955  Today's Date: 11/05/2021 OT Individual Time: 1478-2956 OT Individual Time Calculation (min): 72 min    Short Term Goals: Week 3:  OT Short Term Goal 1 (Week 3): STG= LTG  Skilled Therapeutic Interventions/Progress Updates:    Pt greeted semi-reclined in bed and agreeable to OT treatment session.  Discussed discharge plan and OT goals at length with pt. Pt stated she does not want to go SNF and has planned to go home with the assistance from her daughter. Pt's daughter needs more practice with transfers prior to dc, but her daughter is not allowed up here right now due to current COVID restrictions. Will discuss this issue further with team. Pt requested to shower today.Worked on stand-pivot and squat-pivot transfers with overall mod A-Bed>wc>BSC over toilet>wc>tub bench>wc. Pt needed verbal and tactile cues to set-up transfer correctly, especially positioning of LLE prior to stand. Pt needed assistance for clothing management at toilet, but had successful Bm and voided bladder. Worked on pt completing posterior peri-care with min A. Min/mod A overall for bathing tasks with OT providing hand over hand A to integrate L UE for neuro re-ed. Dressing tasks completed from wc at the sink with review of hemi-techniques. Max A for LB due to time management. OT assistance to manage hair dryer while she tossled hair. Pt handoff to PT for next therapy session.   Therapy Documentation Precautions:  Precautions Precautions: Fall, Other (comment) Precaution Comments: L hemiplegia, L inattention, R hip and knee pain Restrictions Weight Bearing Restrictions: No Pain:  Denied pain   Therapy/Group: Individual Therapy  Mal Amabile 11/05/2021, 10:26 AM

## 2021-11-05 NOTE — Progress Notes (Signed)
PROGRESS NOTE   Subjective/Complaints:  Reports increased sensations on her left side that come and go. No new concerns.   ROS: Patient denies fever, rash, sore throat, blurred vision, dizziness, nausea, vomiting, diarrhea, cough, shortness of breath or chest pain, joint or back/neck pain, headache, or mood change.     Objective:   No results found. No results for input(s): "WBC", "HGB", "HCT", "PLT" in the last 72 hours.  No results for input(s): "NA", "K", "CL", "CO2", "GLUCOSE", "BUN", "CREATININE", "CALCIUM" in the last 72 hours.   Intake/Output Summary (Last 24 hours) at 11/05/2021 1114 Last data filed at 11/05/2021 0718 Gross per 24 hour  Intake 896 ml  Output --  Net 896 ml         Physical Exam: Vital Signs Blood pressure 119/67, pulse 80, temperature 97.7 F (36.5 C), resp. rate 17, height 5' 5.98" (1.676 m), weight 80.6 kg, SpO2 94 %.   Constitutional: No distress . Vital signs reviewed. HEENT: NCAT, EOMI, oral membranes moist Neck: supple Cardiovascular: RRR without murmur. No JVD    Respiratory/Chest: CTA Bilaterally without wheezes or rales. Good air movement  GI/Abdomen: BS +, non-tender, non-distended Ext: no clubbing, cyanosis, or edema Psych: pleasant and cooperative  Skin: warm, dry, Clean and intact without signs of breakdown Neuro: Cranial nerves II through XII intact, motor strength is 5/5 in right and 0/5 left deltoid, bicep, tricep,2-finger flexors and pronators , hip flexor or, knee extensors, ankle dorsiflexor and plantar flexor.  2-left hip ext and add--stable exam.  Sensory exam normal to light touch and proprioception in right and impaired tr/2 LT sensation left upper and lower extremities.  .  Musculoskeletal: FROM all 4 extremities. Tender bilat lumbar paraspinal , RIght greater troch, left shoulder/elbow discomfort present   Assessment/Plan: 1. Functional deficits which require 3+  hours per day of interdisciplinary therapy in a comprehensive inpatient rehab setting. Physiatrist is providing close team supervision and 24 hour management of active medical problems listed below. Physiatrist and rehab team continue to assess barriers to discharge/monitor patient progress toward functional and medical goals  Care Tool:  Bathing    Body parts bathed by patient: Left arm, Chest, Abdomen, Front perineal area, Right upper leg, Left upper leg, Right lower leg, Face   Body parts bathed by helper: Left lower leg, Buttocks, Right arm     Bathing assist Assist Level: Minimal Assistance - Patient > 75%     Upper Body Dressing/Undressing Upper body dressing   What is the patient wearing?: Pull over shirt    Upper body assist Assist Level: Contact Guard/Touching assist    Lower Body Dressing/Undressing Lower body dressing      What is the patient wearing?: Underwear/pull up, Pants     Lower body assist Assist for lower body dressing: Minimal Assistance - Patient > 75%     Toileting Toileting Toileting Activity did not occur Press photographer and hygiene only): Refused  Toileting assist Assist for toileting: Minimal Assistance - Patient > 75%     Transfers Chair/bed transfer  Transfers assist  Chair/bed transfer activity did not occur: Safety/medical concerns  Chair/bed transfer assist level: Minimal Assistance - Patient > 75%  Locomotion Ambulation   Ambulation assist   Ambulation activity did not occur: Safety/medical concerns  Assist level: 2 helpers (mod A of 1 and +2 R HHA min A) Assistive device: Hand held assist Max distance: 2ft   Walk 10 feet activity   Assist  Walk 10 feet activity did not occur: Safety/medical concerns  Assist level: 2 helpers Assistive device: Other (comment) (rail in hallway)   Walk 50 feet activity   Assist Walk 50 feet with 2 turns activity did not occur: Safety/medical concerns         Walk 150  feet activity   Assist Walk 150 feet activity did not occur: Safety/medical concerns         Walk 10 feet on uneven surface  activity   Assist Walk 10 feet on uneven surfaces activity did not occur: Safety/medical concerns         Wheelchair     Assist Is the patient using a wheelchair?: Yes Type of Wheelchair: Manual Wheelchair activity did not occur: Safety/medical concerns  Wheelchair assist level: Set up assist, Supervision/Verbal cueing Max wheelchair distance: 3ft    Wheelchair 50 feet with 2 turns activity    Assist    Wheelchair 50 feet with 2 turns activity did not occur: Safety/medical concerns       Wheelchair 150 feet activity     Assist  Wheelchair 150 feet activity did not occur: Safety/medical concerns       Blood pressure 119/67, pulse 80, temperature 97.7 F (36.5 C), resp. rate 17, height 5' 5.98" (1.676 m), weight 80.6 kg, SpO2 94 %.  Medical Problem List and Plan: 1. 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  Antithrombotics: -DVT/anticoagulation:  Mechanical: Antiembolism stockings, knee hi  (TED hose) Bilateral lower extremities             -antiplatelet therapy: N/A 3. Pain Management: Tylenol as needed, has burning discomfort on Left side , discomfort LLE likely due to Thalamic stroke, -Fibromyalgia: added low dose duloxetine 20mg  qd- also with some soft tissue pain related to positioning in bed overnite  Continue gabapentin 100mg  TID     4. Mood/Sleep: Provide emotional support, pt frustrated with dependant role will stop buspirone and monitor              -antipsychotic agents: N/A 5.  Neuropsych/cognition: This patient is capable of making decisions on her own behalf. 6. Skin/Wound Care: Routine skin checks 7. Fluids/Electrolytes/Nutrition: Routine in and outs with follow-up chemistries 8.  Dysphagia.  -advanced to regular diet/thin  -eating 80-100% of meals 9.  Hypertension.  Cozaar 50 mg daily, Lopressor 25 mg twice daily.  Monitor with increased mobility Vitals:   11/04/21 1921 11/05/21 0458  BP: 126/67 119/67  Pulse: 84 80  Resp: 17 17  Temp: 98.7 F (37.1 C) 97.7 F (36.5 C)  SpO2: 94% 94%    BP controlled 7/23  10.  PAF.  s/p ablation no need to restart Eliquis as per Dr  Casper Harrison, cardiology  11.  Obesity.  BMI 29.21.  Dietary follow-up 12.  History of recurrent UTI.  Prophylactic Macrodantin 50 mg daily.  Doing ok  13.  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  Constipation ,resolved change miralax to PRN (was BID)   -LBM 7/24   16.  Hot flashes, many years post menopausal, , gabapentin may help but could require a higher dose   Improved on premarin  17.  Episode of CPx 1 resolved, exam has tenderness RIght parasternal, EKG and troponin negative, likely MSK      LOS: 35 days A FACE TO FACE EVALUATION WAS PERFORMED  Fanny Dance 11/05/2021, 11:14 AM

## 2021-11-06 ENCOUNTER — Other Ambulatory Visit (HOSPITAL_COMMUNITY): Payer: Self-pay

## 2021-11-06 MED ORDER — GABAPENTIN 100 MG PO CAPS: 100.0000 mg | ORAL_CAPSULE | Freq: Three times a day (TID) | ORAL | 0 refills | Status: DC

## 2021-11-06 MED ORDER — CETIRIZINE HCL 10 MG PO TABS
10.0000 mg | ORAL_TABLET | Freq: Every day | ORAL | Status: DC
Start: 2021-11-06 — End: 2021-12-04

## 2021-11-06 MED ORDER — METHOCARBAMOL 500 MG PO TABS
500.0000 mg | ORAL_TABLET | Freq: Three times a day (TID) | ORAL | 0 refills | Status: DC | PRN
Start: 2021-11-06 — End: 2021-11-06
  Filled 2021-11-06: qty 60, 20d supply, fill #0

## 2021-11-06 MED ORDER — ESTROGENS CONJUGATED 0.625 MG/GM VA CREA
TOPICAL_CREAM | Freq: Every day | VAGINAL | 12 refills | Status: DC
  Filled 2021-11-06: qty 42.5, fill #0

## 2021-11-06 MED ORDER — ROSUVASTATIN CALCIUM 20 MG PO TABS
20.0000 mg | ORAL_TABLET | Freq: Every day | ORAL | 0 refills | Status: DC
  Filled 2021-11-06: qty 30, 30d supply, fill #0

## 2021-11-06 MED ORDER — ESTRADIOL 0.5 MG PO TABS: 0.5000 mg | ORAL_TABLET | ORAL | Status: DC

## 2021-11-06 MED ORDER — ESTRADIOL 0.1 MG/GM VA CREA
TOPICAL_CREAM | Freq: Every day | VAGINAL | 12 refills | Status: DC
  Filled 2021-11-06: qty 42.5, fill #0
  Filled 2021-11-07: qty 42.5, 30d supply, fill #0

## 2021-11-06 MED ORDER — DULOXETINE HCL 20 MG PO CPEP: 20.0000 mg | ORAL_CAPSULE | Freq: Every day | ORAL | 0 refills | Status: DC

## 2021-11-06 MED ORDER — ROSUVASTATIN CALCIUM 20 MG PO TABS: 20.0000 mg | ORAL_TABLET | Freq: Every day | ORAL | 0 refills | Status: DC

## 2021-11-06 MED ORDER — PANTOPRAZOLE SODIUM 40 MG PO TBEC
40.0000 mg | DELAYED_RELEASE_TABLET | Freq: Every day | ORAL | 0 refills | Status: DC
  Filled 2021-11-06: qty 30, 30d supply, fill #0

## 2021-11-06 MED ORDER — TIZANIDINE HCL 2 MG PO TABS
2.0000 mg | ORAL_TABLET | Freq: Every day | ORAL | 0 refills | Status: DC
  Filled 2021-11-06: qty 30, 30d supply, fill #0

## 2021-11-06 MED ORDER — METHOCARBAMOL 500 MG PO TABS
500.0000 mg | ORAL_TABLET | Freq: Three times a day (TID) | ORAL | 0 refills | Status: DC | PRN
  Filled 2021-11-06: qty 60, 20d supply, fill #0

## 2021-11-06 MED ORDER — PANTOPRAZOLE SODIUM 40 MG PO TBEC
40.0000 mg | DELAYED_RELEASE_TABLET | Freq: Every day | ORAL | 0 refills | Status: DC
Start: 2021-11-06 — End: 2021-12-04
  Filled 2021-11-06: qty 30, 30d supply, fill #0

## 2021-11-06 MED ORDER — LOSARTAN POTASSIUM 50 MG PO TABS
50.0000 mg | ORAL_TABLET | Freq: Every day | ORAL | 0 refills | Status: DC
Start: 2021-11-06 — End: 2021-12-04
  Filled 2021-11-06: qty 30, 30d supply, fill #0

## 2021-11-06 MED ORDER — METHOCARBAMOL 500 MG PO TABS
500.0000 mg | ORAL_TABLET | Freq: Three times a day (TID) | ORAL | 0 refills | Status: DC | PRN
Start: 2021-11-06 — End: 2021-11-06

## 2021-11-06 MED ORDER — PANTOPRAZOLE SODIUM 40 MG PO TBEC
40.0000 mg | DELAYED_RELEASE_TABLET | Freq: Every day | ORAL | 0 refills | Status: DC
Start: 2021-11-06 — End: 2021-11-06

## 2021-11-06 MED ORDER — ESTROGENS CONJUGATED 0.625 MG/GM VA CREA: TOPICAL_CREAM | Freq: Every day | VAGINAL | 12 refills | Status: DC

## 2021-11-06 MED ORDER — METOPROLOL TARTRATE 25 MG PO TABS
25.0000 mg | ORAL_TABLET | Freq: Two times a day (BID) | ORAL | 0 refills | Status: DC
Start: 2021-11-06 — End: 2021-11-06

## 2021-11-06 MED ORDER — GABAPENTIN 100 MG PO CAPS
100.0000 mg | ORAL_CAPSULE | Freq: Three times a day (TID) | ORAL | 0 refills | Status: DC
Start: 2021-11-06 — End: 2021-12-04
  Filled 2021-11-06: qty 90, 30d supply, fill #0

## 2021-11-06 MED ORDER — POLYETHYLENE GLYCOL 3350 17 G PO PACK: 17.0000 g | PACK | Freq: Every day | ORAL | 0 refills | Status: DC | PRN

## 2021-11-06 MED ORDER — MELATONIN 3 MG PO TABS: 3.0000 mg | ORAL_TABLET | Freq: Every day | ORAL | 0 refills | Status: DC

## 2021-11-06 MED ORDER — TAMSULOSIN HCL 0.4 MG PO CAPS
0.4000 mg | ORAL_CAPSULE | Freq: Every day | ORAL | 0 refills | Status: DC
  Filled 2021-11-06: qty 30, 30d supply, fill #0

## 2021-11-06 MED ORDER — TAMSULOSIN HCL 0.4 MG PO CAPS
0.4000 mg | ORAL_CAPSULE | Freq: Every day | ORAL | 0 refills | Status: DC
Start: 2021-11-06 — End: 2021-12-04
  Filled 2021-11-06: qty 30, 30d supply, fill #0

## 2021-11-06 MED ORDER — LIDOCAINE 5 % EX PTCH
1.0000 | MEDICATED_PATCH | CUTANEOUS | 0 refills | Status: DC
  Filled 2021-11-06: qty 30, 30d supply, fill #0

## 2021-11-06 MED ORDER — PROGESTERONE MICRONIZED 100 MG PO CAPS: 100.0000 mg | ORAL_CAPSULE | ORAL | Status: DC

## 2021-11-06 MED ORDER — DICLOFENAC SODIUM 1 % EX GEL: 2.0000 g | CUTANEOUS | 0 refills | Status: DC | PRN

## 2021-11-06 MED ORDER — LOSARTAN POTASSIUM 50 MG PO TABS
50.0000 mg | ORAL_TABLET | Freq: Every day | ORAL | 0 refills | Status: DC
Start: 2021-11-06 — End: 2021-11-06

## 2021-11-06 MED ORDER — MELATONIN 3 MG PO TABS
3.0000 mg | ORAL_TABLET | Freq: Every day | ORAL | 0 refills | Status: DC
  Filled 2021-11-06: qty 30, 30d supply, fill #0

## 2021-11-06 MED ORDER — LIDOCAINE 5 % EX PTCH
1.0000 | MEDICATED_PATCH | CUTANEOUS | 0 refills | Status: DC
Start: 2021-11-06 — End: 2021-12-04

## 2021-11-06 MED ORDER — TIZANIDINE HCL 2 MG PO TABS: 2.0000 mg | ORAL_TABLET | Freq: Every day | ORAL | 0 refills | Status: DC

## 2021-11-06 MED ORDER — DICLOFENAC SODIUM 1 % EX GEL
2.0000 g | CUTANEOUS | 0 refills | Status: DC | PRN
  Filled 2021-11-06: qty 100, 30d supply, fill #0

## 2021-11-06 MED ORDER — ACETAMINOPHEN 325 MG PO TABS: 650.0000 mg | ORAL_TABLET | ORAL | Status: AC | PRN

## 2021-11-06 MED ORDER — TAMSULOSIN HCL 0.4 MG PO CAPS
0.4000 mg | ORAL_CAPSULE | Freq: Every day | ORAL | 0 refills | Status: DC
Start: 2021-11-06 — End: 2021-11-06

## 2021-11-06 MED ORDER — GABAPENTIN 100 MG PO CAPS
100.0000 mg | ORAL_CAPSULE | Freq: Three times a day (TID) | ORAL | 0 refills | Status: DC
  Filled 2021-11-06: qty 90, 30d supply, fill #0

## 2021-11-06 MED ORDER — LOSARTAN POTASSIUM 50 MG PO TABS
50.0000 mg | ORAL_TABLET | Freq: Every day | ORAL | 0 refills | Status: DC
Start: 2021-11-06 — End: 2021-11-06
  Filled 2021-11-06: qty 30, 30d supply, fill #0

## 2021-11-06 MED ORDER — DULOXETINE HCL 20 MG PO CPEP
20.0000 mg | ORAL_CAPSULE | Freq: Every day | ORAL | 0 refills | Status: DC
Start: 2021-11-06 — End: 2021-11-06
  Filled 2021-11-06: qty 30, 30d supply, fill #0

## 2021-11-06 MED ORDER — NITROFURANTOIN MACROCRYSTAL 50 MG PO CAPS
50.0000 mg | ORAL_CAPSULE | Freq: Every day | ORAL | 0 refills | Status: DC
Start: 2021-11-07 — End: 2021-12-04
  Filled 2021-11-06: qty 30, 30d supply, fill #0

## 2021-11-06 MED ORDER — NITROFURANTOIN MACROCRYSTAL 50 MG PO CAPS
50.0000 mg | ORAL_CAPSULE | Freq: Every day | ORAL | 0 refills | Status: DC
  Filled 2021-11-06: qty 30, 30d supply, fill #0

## 2021-11-06 MED ORDER — METOPROLOL TARTRATE 25 MG PO TABS
25.0000 mg | ORAL_TABLET | Freq: Two times a day (BID) | ORAL | 0 refills | Status: DC
  Filled 2021-11-06: qty 60, 30d supply, fill #0

## 2021-11-06 MED ORDER — CYANOCOBALAMIN 500 MCG PO TABS: 2500.0000 ug | ORAL_TABLET | Freq: Every day | ORAL | 0 refills | Status: AC

## 2021-11-06 MED ORDER — ROSUVASTATIN CALCIUM 20 MG PO TABS
20.0000 mg | ORAL_TABLET | Freq: Every day | ORAL | 0 refills | Status: DC
Start: 2021-11-06 — End: 2021-12-04
  Filled 2021-11-06: qty 30, 30d supply, fill #0

## 2021-11-06 MED ORDER — NITROFURANTOIN MACROCRYSTAL 50 MG PO CAPS
50.0000 mg | ORAL_CAPSULE | Freq: Every day | ORAL | 0 refills | Status: DC
Start: 2021-11-07 — End: 2021-11-06

## 2021-11-06 MED ORDER — DICLOFENAC SODIUM 1 % EX GEL
2.0000 g | CUTANEOUS | 0 refills | Status: AC | PRN
  Filled 2021-11-06: qty 100, 15d supply, fill #0

## 2021-11-06 NOTE — Progress Notes (Signed)
PROGRESS NOTE   Subjective/Complaints:  Pt to dc home after family training today. No new complaints or concerns.   ROS: Patient denies fever, rash, sore throat, blurred vision, dizziness, nausea, vomiting, diarrhea, cough, palpitations, shortness of breath or chest pain, joint or back/neck pain, headache, or mood change.     Objective:   No results found. No results for input(s): "WBC", "HGB", "HCT", "PLT" in the last 72 hours.  No results for input(s): "NA", "K", "CL", "CO2", "GLUCOSE", "BUN", "CREATININE", "CALCIUM" in the last 72 hours.   Intake/Output Summary (Last 24 hours) at 11/06/2021 0855 Last data filed at 11/06/2021 0700 Gross per 24 hour  Intake 600 ml  Output --  Net 600 ml         Physical Exam: Vital Signs Blood pressure (!) 114/56, pulse (!) 59, temperature 97.8 F (36.6 C), resp. rate 16, height 5' 5.98" (1.676 m), weight 80.6 kg, SpO2 92 %.   Constitutional: No distress . Vital signs reviewed. HEENT: NCAT, conjugate gaze, oral membranes moist Neck: supple Cardiovascular: RRR without murmur. No JVD    Respiratory/Chest: CTA Bilaterally without wheezes or rales. Good air movement  GI/Abdomen: BS +, soft, non-tender, non-distended Ext: no clubbing, cyanosis, or edema Psych: pleasant and cooperative  Skin: warm, dry, Clean and intact without signs of breakdown Neuro: Cranial nerves II through XII intact, motor strength is 5/5 in right and 0/5 left deltoid, bicep, tricep,2-finger flexors and pronators , hip flexor or, knee extensors, ankle dorsiflexor and plantar flexor.  2-left hip ext and add--stable exam.  Sensory exam normal to light touch and proprioception in right and impaired tr/2 LT sensation left upper and lower extremities.  .  Musculoskeletal: FROM all 4 extremities. Tender bilat lumbar paraspinal , RIght greater troch, left shoulder/elbow discomfort present   Assessment/Plan: 1.  Functional deficits which require 3+ hours per day of interdisciplinary therapy in a comprehensive inpatient rehab setting. Physiatrist is providing close team supervision and 24 hour management of active medical problems listed below. Physiatrist and rehab team continue to assess barriers to discharge/monitor patient progress toward functional and medical goals  Care Tool:  Bathing    Body parts bathed by patient: Left arm, Chest, Abdomen, Front perineal area, Right upper leg, Left upper leg, Right lower leg, Face   Body parts bathed by helper: Left lower leg, Buttocks, Right arm     Bathing assist Assist Level: Minimal Assistance - Patient > 75%     Upper Body Dressing/Undressing Upper body dressing   What is the patient wearing?: Pull over shirt    Upper body assist Assist Level: Contact Guard/Touching assist    Lower Body Dressing/Undressing Lower body dressing      What is the patient wearing?: Underwear/pull up, Pants     Lower body assist Assist for lower body dressing: Minimal Assistance - Patient > 75%     Toileting Toileting Toileting Activity did not occur Landscape architect and hygiene only): Refused  Toileting assist Assist for toileting: Minimal Assistance - Patient > 75%     Transfers Chair/bed transfer  Transfers assist  Chair/bed transfer activity did not occur: Safety/medical concerns  Chair/bed transfer assist level: Minimal Assistance -  Patient > 75%     Locomotion Ambulation   Ambulation assist   Ambulation activity did not occur: Safety/medical concerns  Assist level: 2 helpers (mod A of 1 and +2 R HHA min A) Assistive device: Hand held assist Max distance: 10ft   Walk 10 feet activity   Assist  Walk 10 feet activity did not occur: Safety/medical concerns  Assist level: 2 helpers Assistive device: Other (comment) (rail in hallway)   Walk 50 feet activity   Assist Walk 50 feet with 2 turns activity did not occur:  Safety/medical concerns         Walk 150 feet activity   Assist Walk 150 feet activity did not occur: Safety/medical concerns         Walk 10 feet on uneven surface  activity   Assist Walk 10 feet on uneven surfaces activity did not occur: Safety/medical concerns         Wheelchair     Assist Is the patient using a wheelchair?: Yes Type of Wheelchair: Manual Wheelchair activity did not occur: Safety/medical concerns  Wheelchair assist level: Set up assist, Supervision/Verbal cueing Max wheelchair distance: 4ft    Wheelchair 50 feet with 2 turns activity    Assist    Wheelchair 50 feet with 2 turns activity did not occur: Safety/medical concerns       Wheelchair 150 feet activity     Assist  Wheelchair 150 feet activity did not occur: Safety/medical concerns       Blood pressure (!) 114/56, pulse (!) 59, temperature 97.8 F (36.6 C), resp. rate 16, height 5' 5.98" (1.676 m), weight 80.6 kg, SpO2 92 %.  Medical Problem List and Plan: 1. 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  Antithrombotics: -DVT/anticoagulation:  Mechanical: Antiembolism stockings, knee hi  (TED hose) Bilateral lower extremities             -antiplatelet therapy: N/A 3. Pain Management: Tylenol as needed, has burning discomfort on Left side , discomfort LLE likely due to Thalamic stroke, -Fibromyalgia: added low dose duloxetine 20mg  qd- also with some soft tissue pain related to positioning in bed overnite  -Continue gabapentin 100mg  TID -Pain under control, continue current 4. Mood/Sleep: Provide emotional support, pt frustrated  with dependant role will stop buspirone and monitor              -antipsychotic agents: N/A 5. Neuropsych/cognition: This patient is capable of making decisions on her own behalf. 6. Skin/Wound Care: Routine skin checks 7. Fluids/Electrolytes/Nutrition: Routine in and outs with follow-up chemistries 8.  Dysphagia.  -advanced to regular diet/thin  -eating 100% of meals 9.  Hypertension.  Cozaar 50 mg daily, Lopressor 25 mg twice daily.  Monitor with increased mobility Vitals:   11/05/21 1958 11/06/21 0510  BP: 140/66 (!) 114/56  Pulse: 79 (!) 59  Resp: 17 16  Temp: 98.1 F (36.7 C) 97.8 F (36.6 C)  SpO2: 96% 92%    BP controlled 7/25 10.  PAF.  s/p ablation no need to restart Eliquis  as per Dr  Sandy Salaam, cardiology  11.  Obesity.  BMI 29.21.  Dietary follow-up 12.  History of recurrent UTI.  Prophylactic Macrodantin 50 mg daily.  Doing ok  13.  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  Constipation ,resolved change miralax to PRN (was BID)   -LBM 7/24  x2 16.  Hot flashes, many years post menopausal, , gabapentin may help but could require a higher dose   Improved on premarin  17.  Episode of CPx 1 resolved, exam has tenderness RIght parasternal, EKG and troponin negative, likely MSK      LOS: 36 days A FACE TO FACE EVALUATION WAS PERFORMED  Fanny Dance 11/06/2021, 8:55 AM

## 2021-11-06 NOTE — Progress Notes (Signed)
Physical Therapy Session Note  Patient Details  Name: Charlotte Hunter MRN: [id number] Date of Birth: 11-Jun-1955  Today's Date: 11/06/2021 PT Individual Time: 0800-0900 + 1300-1420 PT Individual Time Calculation (min): 60 min  + 80 min  Short Term Goals: Week 4:  PT Short Term Goal 1 (Week 4): = to LTGs based on ELOS PT Short Term Goal 1 - Progress (Week 4): Progressing toward goal Week 5:  PT Short Term Goal 1 (Week 5): = to LTGs based on ELOS  Skilled Therapeutic Interventions/Progress Updates:     1st session: Pt sitting in w/c to start - agreeable to therapy session. Denies pain at start but reports R knee/hip pain during gait training later in session. RN notified via secure chat.   Focused session on DC planning as per medical team, pt is planned to DC home today after family education/training sessions. Will DC via medical transport. Daughter not present to start session but arrived at [place] for remaining 20 minutes. Time spent with daughter focused on mostly verbal education as daughter had plenty of questions regarding DC plan, specifically DME which was reviewed. Secure chat to CSW to confirm DME ordered. Patient and daughter also having several other questions regarding general DC plan such as medications, packing up supplies, time of transport, etc - answered questions as able and deferred when appropriate.  Pt was able to complete short distance ambulation ~57ft with minA and RW using orthosis for RW. Gait distance limited by complaints of R knee > R hip pain. Pt lacks knee flexion in swing and locks R knee in hyperextension during stance.   Pt concluded session seated in w/c with all needs met, daughter at bedside. Informed daughter to be present during whole session in PM to finalize hadns on training.   2nd session: Pt in bed to start. Daughter at bedside for scheduled family education/training. She was present throughout duration of training. Reviewed wheelchair parts and  management with the daughter, including leg rests, brakes with extenders, arm rests, and folding/unfolding chair. Daughter completed parts management without assist. Reviewed again with family DME rec's, PT goals, PT POC, and f/u therapy POC for [facility] therapies.   Pt completing supine sitting EOB with supervision with HOB slightly raised and use of hospital bed rail. Donned L ankle wrap and tennis shoes with dependant assist for time management.   Daughter completing repeated squat<>pivot transfers L<>R in both directions with PT providing supervision for safety. Daughter educated on body mechanics, positioning, and to avoid pulling her paretic LUE during transfer.   Transported to ortho rehab gym for time in w/c and completed car transfer per daughter's request. Pt completed via stand<>pivot with modA. Stand pivot completed due to height of SUV. Daughter completed stand<>pivot out of car with PT providing supervision and min cues for safety. Educated on continued training with [facility] therapies.  Daughter requesting to observe short distance gait training using RW. Her personal RW had been delivered and this was adjusted to fit with coban tape applied to R hand and L walker frame for visual cue to improve widening BOS during gait. She ambulated ~10-15 ft with minA and RW with modA needed for safely turning to sit. Reinforced to daughter that it is recommended pt be wheelchair level only until further training with [facility] therapies.   Pt returned to her room in w/c with all needs met, daughter at bedside, education complete.   Therapy Documentation Precautions:  Precautions Precautions: Fall, Other (comment) Precaution Comments: L hemiplegia, L  inattention, R hip and knee pain Restrictions Weight Bearing Restrictions: No General:     Therapy/Group: Individual Therapy  Charlotte Hunter 11/06/2021, 7:21 AM

## 2021-11-06 NOTE — Discharge Summary (Signed)
Physical Therapy Discharge Summary  Patient Details  Name: Charlotte Hunter MRN: [id number] Date of Birth: 1956-02-10  {CHL IP REHAB PT TIME CALCULATION:[id number]}   Patient has met 5 of 9 long term goals due to improved activity tolerance, improved balance, improved postural control, increased strength, increased range of motion, decreased pain, ability to compensate for deficits, functional use of  left lower extremity, improved attention, and improved awareness.  Patient to discharge at a wheelchair level [place].   Patient's care partner is independent to provide the necessary physical and cognitive assistance at discharge.  Reasons goals not met: Ambulation, stair, and car transfer goals set to minA. Pt requires modA for functional ambulation using RW. Due to heavy assist required, recommend wheelchair level only at discharge - this has been communicated to family and patient during family training/education sessions. Also recommended for medical transport home due to pt's inability to safely navigate stairs to enter her home and due to heavy assist needed for entering and exiting a vehicle. Daughter trained on bed mobility, functional transfers, and wheelchair management.   Recommendation:  Patient will benefit from ongoing skilled PT services in home health setting to continue to advance safe functional mobility, address ongoing impairments in L sided weakness, bed mobility, functional transfers, caregiver training, home safety, and minimize fall risk.  Equipment: RW, manual tilt in space custom wheelchair through [employer]  Reasons for discharge: treatment goals met and discharge from hospital  Patient/family agrees with progress made and goals achieved: Yes  PT Discharge Precautions/Restrictions Precautions Precautions: Fall;Other (comment) Precaution Comments: L hemiplegia, fibromylagia, R hip and knee pain Restrictions Weight Bearing Restrictions: No Pain Interference Pain  Interference Pain Effect on Sleep: 4. Almost constantly Pain Interference with Therapy Activities: 4. Almost constantly Pain Interference with Day-to-Day Activities: 4. Almost constantly Vision/Perception  Vision - History Ability to See in Adequate Light: 0 Adequate Perception Perception: Impaired Inattention/Neglect: Does not attend to left side of body Praxis Praxis: Impaired Praxis Impairment Details: Motor planning  Cognition Overall Cognitive Status: Within Functional Limits for tasks assessed Arousal/Alertness: Awake/alert Orientation Level: Oriented X4 Focused Attention: Appears intact Sustained Attention: Appears intact Selective Attention: Appears intact Memory: Appears intact Awareness: Impaired Awareness Impairment: Emergent impairment;Anticipatory impairment Problem Solving: Appears intact Safety/Judgment: Appears intact Sensation Sensation Light Touch: Impaired by gross assessment Light Touch Impaired Details: Impaired LLE;Impaired LUE Hot/Cold: Not tested Proprioception: Impaired Detail Proprioception Impaired Details: Impaired LLE;Impaired LUE Stereognosis: Not tested Coordination Gross Motor Movements are Fluid and Coordinated: No Coordination and Movement Description: Movement's limited primarily by L sided weakness (LUE > LLE), general deconditioning, and chronic R hip/knee pain Heel Shin Test: UTA on L due to weakness. Motor  Motor Motor: Hemiplegia Motor - Discharge Observations: L hemiplegia ( LUE > LLE) that has improved since date of evaluation.  Mobility Bed Mobility Bed Mobility: Supine to Sit;Sit to Supine Supine to Sit: Minimal Assistance - Patient > 75% Sit to Supine: Minimal Assistance - Patient > 75% Transfers Transfers: Sit to Stand;Stand to Sit;Transfer via Lift Equipment;Squat Pivot Transfers;Stand Pivot Transfers Sit to Stand: Minimal Assistance - Patient > 75% Stand to Sit: Minimal Assistance - Patient > 75% Stand Pivot Transfers:  Minimal Assistance - Patient > 75%;Moderate Assistance - Patient 50 - 74% Stand Pivot Transfer Details: Verbal cues for precautions/safety;Verbal cues for gait pattern;Verbal cues for sequencing;Verbal cues for technique;Visual cues/gestures for precautions/safety;Tactile cues for weight shifting;Tactile cues for posture;Tactile cues for placement;Manual facilitation for weight shifting Squat Pivot Transfers: Contact Guard/Touching assist;Minimal Assistance - Patient >  75% Transfer (Assistive device): 1 person hand held assist Locomotion  Gait Ambulation: Yes Gait Assistance: Moderate Assistance - Patient 50-74% Gait Distance (Feet): 30 Feet Assistive device: Rolling walker Gait Assistance Details: Tactile cues for initiation;Tactile cues for sequencing;Tactile cues for weight shifting;Tactile cues for posture;Tactile cues for placement;Tactile cues for weight beaing;Verbal cues for safe use of DME/AE;Verbal cues for gait pattern;Verbal cues for technique;Verbal cues for precautions/safety;Verbal cues for sequencing;Manual facilitation for weight shifting;Manual facilitation for placement Gait Gait: Yes Gait Pattern: Impaired Gait Pattern: Step-through pattern;Decreased dorsiflexion - left;Decreased hip/knee flexion - left;Decreased stance time - left;Decreased step length - right;Decreased weight shift to left;Left flexed knee in stance;Poor foot clearance - left;Narrow base of support;Trunk flexed Stairs / Additional Locomotion Stairs: No Wheelchair Mobility Wheelchair Mobility: Yes Wheelchair Assistance: Chartered loss adjuster: Right upper extremity;Right lower extremity Wheelchair Parts Management: Needs assistance Distance: 143f  Trunk/Postural Assessment  Cervical Assessment Cervical Assessment: Exceptions to [facility](forward head) Thoracic Assessment Thoracic Assessment: Exceptions to [facility](rounded shoulders) Lumbar Assessment Lumbar Assessment: Exceptions to  [facility](posterior pelvic tilt) Postural Control Postural Control: Deficits on evaluation Righting Reactions: Delayed Protective Responses: Delayed  Balance Balance Balance Assessed: Yes Static Sitting Balance Static Sitting - Balance Support: Feet supported Static Sitting - Level of Assistance: 7: Independent Dynamic Sitting Balance Dynamic Sitting - Balance Support: Feet supported;Right upper extremity supported Dynamic Sitting - Level of Assistance: 5: Stand by assistance Static Standing Balance Static Standing - Balance Support: Right upper extremity supported Static Standing - Level of Assistance: 4: Min assist Dynamic Standing Balance Dynamic Standing - Balance Support: During functional activity;Right upper extremity supported Dynamic Standing - Level of Assistance: 3: Mod assist Extremity Assessment      RLE Assessment RLE Assessment: Within Functional Limits      Sarajane Fambrough P Jhonnie Aliano PT 11/06/2021, 7:42 AM

## 2021-11-06 NOTE — Progress Notes (Signed)
Patient ID: Charlotte Hunter, female   DOB: 11/18/1955, 66 y.o.   MRN: [id number]  Sw informed patient will discharge this afternoon after family education with daughter. Custom wheelchair ordered with PT last week. DME ordered through [employer]: RW, BSC, TTB. Sw will arrange medical transportation for patient once edu is complete. HH referral sent to [facility].

## 2021-11-06 NOTE — Progress Notes (Addendum)
Patient ID: Charlotte Hunter, female   DOB: September 23, 1955, 66 y.o.   MRN: [id number]  Sw provided patient daughter with contact information for [employer] to confirm DME. SW discussed with patient and daughter Sharin Mons will be arranged for 3PM. D/C address confirmed with daughter.  HH established with [facility].

## 2021-11-06 NOTE — Progress Notes (Signed)
Inpatient Rehabilitation Care Coordinator Discharge Note   Patient Details  Name: Charlotte Hunter MRN: [id number] Date of Birth: 07-Oct-1955   Discharge location: Home  Length of Stay: 36 Days  Discharge activity level: Min/Max A  Home/community participation: Daughter  Patient response TG:YBWLSL Literacy - How often do you need to have someone help you when you read instructions, pamphlets, or other written material from your doctor or pharmacy?: Never  Patient response HT:DSKAJG Isolation - How often do you feel lonely or isolated from those around you?: Never  Services provided included: SW, Neuropsych, Pharmacy, TR, RN, CM, SLP, OT, PT, RD, MD  Financial Services:  Financial Services Utilized: Medicare    Choices offered to/list presented to: Patient and daughter  Follow-up services arranged:  Home Health Home Health Agency: [facility]         Patient response to transportation need: Is the patient able to respond to transportation needs?: Yes In the past 12 months, has lack of transportation kept you from medical appointments or from getting medications?: No In the past 12 months, has lack of transportation kept you from meetings, work, or from getting things needed for daily living?: No    Comments (or additional information):  Patient/Family verbalized understanding of follow-up arrangements:  Yes  Individual responsible for coordination of the follow-up plan: Tera [phone]  Confirmed correct DME delivered: Andria Rhein 11/06/2021    Andria Rhein

## 2021-11-06 NOTE — Progress Notes (Signed)
Occupational Therapy Session Note  Patient Details  Name: Charlotte Hunter MRN: [id number] Date of Birth: 02-Feb-1956  Today's Date: 11/06/2021 OT Individual Time: 0850-1010 OT Individual Time Calculation (min): 80 min    Short Term Goals: Week 4:  OT Short Term Goal 1 (Week 4): continue working towards [employer]   Skilled Therapeutic Interventions/Progress Updates:    Family education session completed with pt and her daughter Tera. Verbal education provided re fall risk reduction, energy conservation strategies, home carryover of transfer training, ADLs, and IADLs. Demonstration and hands on training completed for pt performance of UB/LB bathing and dressing at min A level, toileting hygiene and transfers, and shower transfers. Blocked practice of squat pivot transfers bed <> BSC <> w/c, several times to ensure carryover and safety/confidence with her family. They were both able to demonstrate competence and safety with several trials. Cueing and education provided re caregiver body mechanics. Discussed home accessibility and equipment to be used in the home. Pt left sitting EOB, awaiting PA to come in for education. No further questions.    Therapy Documentation Precautions:  Precautions Precautions: Fall, Other (comment) Precaution Comments: L hemiplegia, fibromylagia, R hip and knee pain Restrictions Weight Bearing Restrictions: No   Therapy/Group: Individual Therapy  Crissie Reese 11/06/2021, 10:19 AM

## 2021-11-06 NOTE — Progress Notes (Signed)
Inpatient Rehabilitation Discharge Medication Review by a Pharmacist  A complete drug regimen review was completed for this patient to identify any potential clinically significant medication issues.  High Risk Drug Classes Is patient taking? Indication by Medication  Antipsychotic No   Anticoagulant No   Antibiotic Yes Nitrofurantoin- UTI ppx  Opioid No   Antiplatelet No   Hypoglycemics/insulin No   Vasoactive Medication Yes Lopressor, flomax, losartan- HTN, BPH  Chemotherapy No   Other Yes Estrogen/estradiol/progesterone- HRT Voltaren gel- arthritic pain Duloxetine- MDD/neuropathic pain Robaxin- muscle spasms Protonix- GERD Zanaflex- muscle spasms Zyrtec- seasonal allergies Crestor- HLD Melatonin- sleep     Type of Medication Issue Identified Description of Issue Recommendation(s)  Drug Interaction(s) (clinically significant)     Duplicate Therapy     Allergy     No Medication Administration End Date     Incorrect Dose     Additional Drug Therapy Needed     Significant med changes from prior encounter (inform family/care partners about these prior to discharge).    Other       Clinically significant medication issues were identified that warrant physician communication and completion of prescribed/recommended actions by midnight of the next day:  No  Name of provider notified for urgent issues identified:   Provider Method of Notification:     Pharmacist comments:   Time spent performing this drug regimen review (minutes):  30   Lauralie Blacksher BS, PharmD, BCPS Clinical Pharmacist 11/06/2021 12:36 PM  Contact: [phone] after 3 PM  "Be curious, not judgmental..." -Debbora Dus

## 2021-11-07 ENCOUNTER — Other Ambulatory Visit (HOSPITAL_COMMUNITY): Payer: Self-pay

## 2021-11-08 ENCOUNTER — Other Ambulatory Visit (HOSPITAL_COMMUNITY): Payer: Self-pay

## 2021-11-19 ENCOUNTER — Telehealth: Payer: Self-pay

## 2021-11-19 ENCOUNTER — Encounter: Payer: Medicare Other | Admitting: Registered Nurse

## 2021-11-19 NOTE — Telephone Encounter (Signed)
Patients daughter called stating she is not sure where to get the leg brace that was recommended.

## 2021-11-19 NOTE — Telephone Encounter (Signed)
Return Charlotte Hunter daughter call, she reports Charlotte Hunter is experiencing increase intensity of neuropathic pain in left wrist and left foot. Also reports she believes her mother stepped wrong on her left foot and now her left foot is swollen. She states [facility] was trying to obtain a order for X-ray.  [facility] office called and Xray ordered given for Left wrist, left ankle and left foot.  Ms. Charlotte Hunter spoke with this provider stating her neuropathic has increased in intensity and she's not receiving any relief with her current medication regimen. She states the pain increases in the evening and at night. She also reports "she cried all night due to the pain ".  Discharge summary was reviewed. Her gabapentin dose  will be changed .  Her daughter Charlotte Hunter is staying with her mother 24 hours, she also states her mother pain has increased in intensity without any relief.  Gabapentin dose change to 100 mg in the morning, afternoon ,evening and 200 mg at HS . She verbalizes understanding.  Daughter Charlotte Hunter or Charlotte Hunter will send a My- Chart message in the morning.  Charlotte Hunter voiced some concerns regarding her inpatient rehabilitation stay, Charlotte Hunter Director with give them a call. She verbalizes understanding.

## 2021-11-19 NOTE — Telephone Encounter (Signed)
Patient daughter called stating patient has had an increase in her nerve pain. Can't walk and keep her up at night. Daughter is requesting an increase in Gabapentin.

## 2021-11-20 ENCOUNTER — Telehealth: Payer: Self-pay | Admitting: Registered Nurse

## 2021-11-20 ENCOUNTER — Encounter: Payer: Self-pay | Admitting: Registered Nurse

## 2021-11-20 NOTE — Telephone Encounter (Signed)
Placed a call to Ms. Goodgame reviewed X-ray results  She verbalizes understanding.

## 2021-11-20 NOTE — Telephone Encounter (Signed)
Received Call from Charlotte Hunter call.  She states she tolerated the gabapentin changes without any side effects such as daytime drowsiness. She states her left wrist and left hand with tingling and burning. While in hospital she was prescribed gabapentin 200 mg TID.  Today : 11/20/2021, she had two doses of her gabapentin 100 , capsules. She was instructed to take additional gabapentin dose now. She will take gabapentin 100 mg at supper and two capsules at bedtime.  Starting on 11/21/2021, her Gabapentin dose will be changed to 200 mg ( two capsules) three times a day. She verbalizes understanding. Her friend also states she understands and verbalizes understanding. The above was discussed with Dr Carlis Abbott and she agrees with plan.  CharlotteHunter reports she had the X-rays obtain this morning. Call placed to [facility] , they will fax the X-ray results.  Charlotte Hunter is aware of the above and verbalizes understanding.

## 2021-11-21 ENCOUNTER — Telehealth: Payer: Self-pay | Admitting: Registered Nurse

## 2021-11-21 DIAGNOSIS — I619 Nontraumatic intracerebral hemorrhage, unspecified: Secondary | ICD-10-CM

## 2021-11-21 NOTE — Telephone Encounter (Signed)
Call  was placed to Ms. Charlotte Hunter , she asked this provider to assist with the call to [facility].  Placed a call to [employer] and spoke to Charlotte Hunter, she stating: " [facility]  need detail notes from the physical therapist or MD. Notes will have to justify the need of the AFO. I sent message to [facility] the inpatient physical therapist, regarding the above. Awaiting on reply.  Call placed to Ms. Charlotte Hunter regarding the above , she verbalizes understanding.  Message sent to Charlotte Hunter, awaiting a reply,call placed to Charlotte Hunter [phone], to see if physical therapist notes can be faxed to [facility].  [facility] Fax: [phone].  Director from [employer] can fax their forms to [place] [phone]: Attention Charlotte Hunter.  Call Placed to [facility]: representative, she states they don';t have any forms to fax Charlotte Hunter. Charlotte Hunter will fax the Hunter notes to this provider. Notes will be reviewed and faxed to [facility].  Call placed to Charlotte Hunter regarding the above, she verbalizes understanding.

## 2021-11-27 ENCOUNTER — Telehealth: Payer: Self-pay | Admitting: *Deleted

## 2021-11-27 NOTE — Telephone Encounter (Signed)
Mrs Charlotte Hunter called and reports that PT has submitted notes and [facility] is waiting for an order for an AFO.

## 2021-12-04 ENCOUNTER — Encounter: Payer: Medicare Other | Attending: Registered Nurse | Admitting: Registered Nurse

## 2021-12-04 ENCOUNTER — Encounter: Payer: Self-pay | Admitting: Registered Nurse

## 2021-12-04 VITALS — BP 144/72 | HR 77 | Ht 65.9 in

## 2021-12-04 DIAGNOSIS — M5416 Radiculopathy, lumbar region: Secondary | ICD-10-CM | POA: Diagnosis present

## 2021-12-04 DIAGNOSIS — I10 Essential (primary) hypertension: Secondary | ICD-10-CM | POA: Insufficient documentation

## 2021-12-04 DIAGNOSIS — I619 Nontraumatic intracerebral hemorrhage, unspecified: Secondary | ICD-10-CM | POA: Insufficient documentation

## 2021-12-04 DIAGNOSIS — M25572 Pain in left ankle and joints of left foot: Secondary | ICD-10-CM | POA: Diagnosis present

## 2021-12-04 MED ORDER — ROSUVASTATIN CALCIUM 20 MG PO TABS
20.0000 mg | ORAL_TABLET | Freq: Every day | ORAL | 0 refills | Status: DC
Start: 2021-12-04 — End: 2022-02-04

## 2021-12-04 MED ORDER — METOPROLOL TARTRATE 25 MG PO TABS: 25.0000 mg | ORAL_TABLET | Freq: Two times a day (BID) | ORAL | 0 refills | Status: AC

## 2021-12-04 MED ORDER — LOSARTAN POTASSIUM 50 MG PO TABS: 50.0000 mg | ORAL_TABLET | Freq: Every day | ORAL | 0 refills | Status: DC

## 2021-12-04 MED ORDER — TIZANIDINE HCL 2 MG PO TABS
2.0000 mg | ORAL_TABLET | Freq: Every day | ORAL | 1 refills | Status: DC
Start: 2021-12-04 — End: 2022-03-11

## 2021-12-04 MED ORDER — TAMSULOSIN HCL 0.4 MG PO CAPS
0.4000 mg | ORAL_CAPSULE | Freq: Every day | ORAL | 0 refills | Status: DC
Start: 2021-12-04 — End: 2023-11-20

## 2021-12-04 MED ORDER — GABAPENTIN 100 MG PO CAPS
100.0000 mg | ORAL_CAPSULE | Freq: Three times a day (TID) | ORAL | 0 refills | Status: DC
Start: 2021-12-04 — End: 2021-12-10

## 2021-12-04 MED ORDER — METHOCARBAMOL 500 MG PO TABS
500.0000 mg | ORAL_TABLET | Freq: Three times a day (TID) | ORAL | 1 refills | Status: DC | PRN
Start: 2021-12-04 — End: 2021-12-28

## 2021-12-04 MED ORDER — DULOXETINE HCL 20 MG PO CPEP
20.0000 mg | ORAL_CAPSULE | Freq: Every day | ORAL | 0 refills | Status: DC
Start: 2021-12-04 — End: 2022-03-27

## 2021-12-04 NOTE — Patient Instructions (Addendum)
Call your Primary Care Physician to schedule  a Hospital Follow up appointment    Call your Cardiologist to [facility] Follow Up Appointment.

## 2021-12-04 NOTE — Progress Notes (Unsigned)
Subjective:    Patient ID: Charlotte Hunter, female    DOB: 10/12/1955, 66 y.o.   MRN: [id number]  HPI: Charlotte Hunter is a 66 y.o. female who is here for HFU appointment for F/U of her Hemorrhagic Stroke with Left Hemiparesis and Essential Hypertension. She presented to [facility] on 09/25/2021 with acute onset of left sided weakness as well as blurred vision.  Dr. Sidney Ace H&P Note: 09/25/2021   Charlotte Hunter is a 66 y.o. female   with past medical history of atrial fibrillation on Eliquis aortic insufficiency, hypertension who presents with nausea vomiting left-sided weakness.  Started around 2 PM.  My arrival patient complaining of headache and vomiting.  She is unable to move the left side.  Stroke alert was called.  Patient says she last took Eliquis this morning.  CT Head WO Contrast:  IMPRESSION: Hemorrhage centered in the right thalamus and basal ganglia, with mild surrounding edema, minimal midline shift, and a small amount of intraventricular extension into the third and fourth ventricles.   CT Head WO Contrast:  IMPRESSION: 1. No significant interval change in size of acute intraparenchymal hemorrhage centered at the right thalamus, estimated volume 4 mL. Small volume intraventricular extension with blood in the third and fourth ventricles. No hydrocephalus or trapping. 2. No other new acute intracranial abnormality.  MRI/MRA:  IMPRESSION: MRI HEAD IMPRESSION:   1. Unchanged size and morphology of acute intraparenchymal hemorrhage centered at the right thalamus. Surrounding edema and regional mass effect has mildly increased as compared to prior CT from 09/25/2021. 2. Intraventricular extension with small volume intraventricular hemorrhage, similar. No hydrocephalus or trapping. 3. Underlying moderate chronic microvascular ischemic disease.   MRA HEAD IMPRESSION:   1. Negative intracranial MRA for large vessel occlusion. 2. Focal severe distal left P2 stenosis. 3.  Otherwise negative intracranial MRA. No other hemodynamically significant or correctable stenosis. No aneurysm or other vascular malformation seen underlying the right thalamic hemorrhage.   Charlotte Hunter was admitted to inpatient rehabilitation on 10/01/2021 and discharged home on 11/06/2021. She is receiving Home Health Therapy from [facility].  She states she has pain in her lower back radiating into her buttocks. Daughter asked about RO Ho Style Cushion therapist recommended, RO Cushion ordered. Charlotte Hunter  also reports left ankle pain, she had a previous X-ray, it was negative. Asked if she would like to have another X-ray she declines at this time, she states she has a scheduled appointment for AFO. Charlotte Hunter also reports she has a headache, like she had previous to her admission got ICH. She is reporting the bright lights are bothering her, also facial sinus pressure she refuses ED evaluation. This was also discussed with Dr Wynn Banker and he is aware she refusing  ED evaluation.   Charlotte Hunter states she walks with the physical therapist only, she arrived in wheelchair.   Dr. Wynn Banker was updated on Charlotte Hunter phone calls to our office post discharged and is aware Beth our director spoke with Charlotte Hunter and her daughter.   Daughter in room.   Pain Inventory Average Pain 7 Pain Right Now 5 My pain is intermittent, constant, sharp, stabbing, tingling, and aching  LOCATION OF PAIN  Left side pain in the: wrist, hand, fingers, back, buttocks, knee, leg, ankle & toes  BOWEL Number of stools per week: 3 Oral laxative use Yes  Type of laxative Senna   BLADDER Normal taking Flomax    Mobility use a walker ability to climb steps?  no do you  drive?  no use a wheelchair needs help with transfers Do you have any goals in this area?  yes  Function 2023 disable  Neuro/Psych weakness numbness tingling trouble walking anxiety  Prior Studies Any changes since last visit?   yes x-rays left ankle & let wrist  Physicians involved in your care Any changes since last visit?  no   No family history on file. Social History   Socioeconomic History   Marital status: Single    Spouse name: Not on file   Number of children: Not on file   Years of education: Not on file   Highest education level: Not on file  Occupational History   Not on file  Tobacco Use   Smoking status: Never   Smokeless tobacco: Never  Vaping Use   Vaping Use: Never used  Substance and Sexual Activity   Alcohol use: Yes    Alcohol/week: 1.0 standard drink of alcohol    Types: 1 Glasses of wine per week   Drug use: Never   Sexual activity: Not Currently  Other Topics Concern   Not on file  Social History Narrative   Not on file   Social Determinants of Health   Financial Resource Strain: Not on file  Food Insecurity: Not on file  Transportation Needs: Not on file  Physical Activity: Not on file  Stress: Not on file  Social Connections: Not on file   No past surgical history on file. Past Medical History:  Diagnosis Date   A-fib [facility])    Aortic insufficiency    Hypertension    There were no vitals taken for this visit.  Opioid Risk Score:   Fall Risk Score:  `1  Depression screen PHQ 2/9      No data to display          Review of Systems  Constitutional:  Positive for unexpected weight change (weight gain).  Gastrointestinal:  Positive for abdominal pain and constipation.  Musculoskeletal:  Positive for back pain, gait problem and neck pain.  Neurological:  Positive for weakness and numbness.       Tingling, spasms  Psychiatric/Behavioral:         Anxiety  All other systems reviewed and are negative.      Objective:   Physical Exam Vitals and nursing note reviewed.  Constitutional:      Appearance: Normal appearance.  Cardiovascular:     Rate and Rhythm: Normal rate and regular rhythm.     Pulses: Normal pulses.     Heart sounds: Normal heart  sounds.  Pulmonary:     Effort: Pulmonary effort is normal.     Breath sounds: Normal breath sounds.  Musculoskeletal:     Cervical back: Normal range of motion and neck supple.     Comments: Normal Muscle Bulk and Muscle Testing Reveals:  Upper Extremities: Right: Full ROM and Muscle Strength 5/5 Left Upper Extremity: Decreased ROM 45 Degrees and Muscle Strength 3/5  Lower Extremities : Right: Full ROM and Muscle Strength 5/5 Left Lower Extremity: Decreased ROM and Muscle Strength 4/5 Left Ankle Edema Noted Wearing Left Knee Brace Arrived in wheelchair    Skin:    General: Skin is warm and dry.  Neurological:     Mental Status: She is alert and oriented to person, place, and time.  Psychiatric:        Mood and Affect: Mood normal.        Behavior: Behavior normal.  Assessment & Plan:  Hemorrhagic Stroke with Left Hemiparesis: Receiving [place] with [facility]. Charlotte Hunter was instructed to call office or send My-Chart message once [place] is completed, so referral can be placed for outpatient therapy. She verbalizes understanding.   Essential Hypertension.Continue current medication regimen. She will call her PCP to schedule HFU appointment. Continue to Monitor.  Lumbar Radiculitis: Continue Gabapentin. Continue to Monitor. RX: [place] ordered: [employer] operational officer) Left Ankle Pain: She refuses X-ray. Has a scheduled appointment for Left AFO she states. Continue to Monitor.  Headache: She refuses ED evaluation. This was discussed with Dr Letta Pate. Continue to Monitor.  F/U with Dr Letta Pate in 4- 6 weeks

## 2021-12-10 MED ORDER — GABAPENTIN 100 MG PO CAPS: ORAL_CAPSULE | ORAL | 3 refills | Status: DC

## 2021-12-10 NOTE — Telephone Encounter (Signed)
[facility] sent over a faxed request for the new script of Gabapentin 100 MG. To include new directions.

## 2021-12-10 NOTE — Addendum Note (Signed)
Addended by: Jones Bales on: 12/10/2021 11:22 AM   Modules accepted: Orders

## 2021-12-10 NOTE — Telephone Encounter (Signed)
Call placed to Ms. Charlotte Hunter Telephone note was reviewed.  Gabapentin prescription sent to pharmacy, She verbalizes understanding.

## 2021-12-13 ENCOUNTER — Telehealth: Payer: Self-pay | Admitting: *Deleted

## 2021-12-13 DIAGNOSIS — I619 Nontraumatic intracerebral hemorrhage, unspecified: Secondary | ICD-10-CM

## 2021-12-13 NOTE — Telephone Encounter (Signed)
Charlotte Hunter called to speak with [name] about appointment and has multiple questions for her.  Pleas give her a call. [phone].

## 2021-12-14 ENCOUNTER — Telehealth: Payer: Self-pay | Admitting: Registered Nurse

## 2021-12-14 NOTE — Telephone Encounter (Signed)
Return Charlotte Hunter call,  She states her home health therapy will be completed on 01/02/2022. She asked for referral to [facility] .Referral Placed. She verbalizes understanding.

## 2021-12-14 NOTE — Telephone Encounter (Signed)
Received a call from [employer] Occupational Therapist:  Tobi Bastos. Her occupational therapy will be extended for weekly sessions for three weeks.

## 2021-12-27 ENCOUNTER — Encounter: Payer: Self-pay | Admitting: Diagnostic Neuroimaging

## 2021-12-27 ENCOUNTER — Ambulatory Visit (INDEPENDENT_AMBULATORY_CARE_PROVIDER_SITE_OTHER): Payer: Medicare Other | Admitting: Diagnostic Neuroimaging

## 2021-12-27 VITALS — BP 124/72 | HR 73 | Ht 66.0 in | Wt 177.0 lb

## 2021-12-27 DIAGNOSIS — I619 Nontraumatic intracerebral hemorrhage, unspecified: Secondary | ICD-10-CM | POA: Diagnosis not present

## 2021-12-27 NOTE — Patient Instructions (Signed)
RIGHT THALAMIC INTRACEREBRAL HEMORRHAGE - continue BP control

## 2021-12-27 NOTE — Progress Notes (Signed)
[facility]  PATIENT: Charlotte Hunter DOB: 01-18-1956  REFERRING CLINICIAN: Marvel Plan, MD HISTORY FROM: patient REASON FOR VISIT: new consult   HISTORICAL  CHIEF COMPLAINT:  Chief Complaint  Patient presents with   Stroke    Rm 7 Hosp FU dgtr- [facility]  "will begin PT next week; nerve pain in left hand, shoulder, foot, heel; pain down both legs during the day"    HISTORY OF PRESENT ILLNESS:   66 year old female here for evaluation of right thalamic intracerebral hemorrhage.  Patient is was admitted to the hospital in June 2023 for new onset left-sided weakness headache and nausea.  She was found to have intracerebral hemorrhage in the right thalamus.  Patient was on Eliquis anticoagulation for atrial fibrillation at the time and this was reversed medically.  She was started on hypertonic saline, blood pressure control in ICU admission.  Patient went to inpatient rehabilitation.  Now patient back home.  Still has left-sided weakness and left hand and arm and leg pain.  Has been to cardiology who recommended to hold anticoagulation for now as patient is not in atrial fibrillation.   REVIEW OF SYSTEMS: Full 14 system review of systems performed and negative with exception of: as per HPI.  ALLERGIES: Allergies  Allergen Reactions   Cefzil [Cefprozil] Other (See Comments)   Codeine Anaphylaxis and Itching   Ms Contin [Morphine] Hives   Roxicodone [Oxycodone] Hives   Shellfish Allergy Anaphylaxis   Pork-Derived Products Other (See Comments)   Iodinated Contrast Media Diarrhea   Zestril [Lisinopril] Cough    HOME MEDICATIONS: Outpatient Medications Prior to Visit  Medication Sig Dispense Refill   acetaminophen (TYLENOL) 325 MG tablet Take 2 tablets (650 mg total) by mouth every 4 (four) hours as needed for mild pain (or temp > 37.5 C (99.5 F)).     cyanocobalamin (CYANOCOBALAMIN) 500 MCG tablet Take 5 tablets (2,500 mcg total) by mouth daily. 30 tablet 0    diclofenac Sodium (VOLTAREN) 1 % GEL Apply 2 g topically as needed (joint pain). 200 g 0   DULoxetine (CYMBALTA) 20 MG capsule Take 1 capsule (20 mg total) by mouth daily. 30 capsule 0   gabapentin (NEURONTIN) 100 MG capsule Take two capsules ( 200 mg) three times a day 180 capsule 3   losartan (COZAAR) 50 MG tablet Take 1 tablet (50 mg total) by mouth daily. 30 tablet 0   melatonin 3 MG TABS tablet Take 1 tablet (3 mg total) by mouth at bedtime. 30 tablet 0   methocarbamol (ROBAXIN) 500 MG tablet Take 1 tablet (500 mg total) by mouth every 8 (eight) hours as needed for muscle spasms. 60 tablet 1   metoprolol tartrate (LOPRESSOR) 25 MG tablet Take 1 tablet (25 mg total) by mouth 2 (two) times daily. 60 tablet 0   rosuvastatin (CRESTOR) 20 MG tablet Take 1 tablet (20 mg total) by mouth daily. 30 tablet 0   senna-docusate (SENOKOT-S) 8.6-50 MG tablet Take 1 tablet by mouth 2 (two) times daily.     tamsulosin (FLOMAX) 0.4 MG CAPS capsule Take 1 capsule (0.4 mg total) by mouth daily after supper. 30 capsule 0   tiZANidine (ZANAFLEX) 2 MG tablet Take 1 tablet (2 mg total) by mouth at bedtime. 30 tablet 1   No facility-administered medications prior to visit.    PAST MEDICAL HISTORY: Past Medical History:  Diagnosis Date   A-fib [facility])    Aortic insufficiency    Hypertension    Stroke ([name])  PAST SURGICAL HISTORY: Past Surgical History:  Procedure Laterality Date   ABDOMINAL HYSTERECTOMY     ABLATION  05/03/2021   heart   c section  01/1968   KNEE SURGERY Right    meniscus   SHOULDER SURGERY Right 08/2014    FAMILY HISTORY: History reviewed. No pertinent family history.  SOCIAL HISTORY: Social History   Socioeconomic History   Marital status: Single    Spouse name: Not on file   Number of children: 1   Years of education: Not on file   Highest education level: Not on file  Occupational History   Not on file  Tobacco Use   Smoking status: Never   Smokeless tobacco:  Never  Vaping Use   Vaping Use: Never used  Substance and Sexual Activity   Alcohol use: Not Currently    Alcohol/week: 1.0 standard drink of alcohol    Types: 1 Glasses of wine per week   Drug use: Never   Sexual activity: Not Currently  Other Topics Concern   Not on file  Social History Narrative   12/27/21 dgtr lives with her   Social Determinants of Health   Financial Resource Strain: Not on file  Food Insecurity: Not on file  Transportation Needs: Not on file  Physical Activity: Not on file  Stress: Not on file  Social Connections: Not on file  Intimate Partner Violence: Not on file     PHYSICAL EXAM  GENERAL EXAM/CONSTITUTIONAL: Vitals:  Vitals:   12/27/21 1333  BP: 124/72  Pulse: 73  Weight: 177 lb (80.3 kg)  Height: 5\' 6"  (1.676 m)   Body mass index is 28.57 kg/m. Wt Readings from Last 3 Encounters:  12/27/21 177 lb (80.3 kg)  10/15/21 177 lb 11.1 oz (80.6 kg)  09/25/21 181 lb (82.1 kg)   Patient is in no distress; well developed, nourished and groomed; neck is supple  CARDIOVASCULAR: Examination of carotid arteries is normal; no carotid bruits Regular rate and rhythm, no murmurs Examination of peripheral vascular system by observation and palpation is normal  EYES: Ophthalmoscopic exam of optic discs and posterior segments is normal; no papilledema or hemorrhages No results found.  MUSCULOSKELETAL: Gait, strength, tone, movements noted in Neurologic exam below  NEUROLOGIC: MENTAL STATUS:      No data to display         awake, alert, oriented to person, place and time recent and remote memory intact normal attention and concentration language fluent, comprehension intact, naming intact fund of knowledge appropriate  CRANIAL NERVE:  2nd - no papilledema on fundoscopic exam 2nd, 3rd, 4th, 6th - pupils equal and reactive to light, visual fields full to confrontation, extraocular muscles intact, no nystagmus 5th - facial sensation  symmetric 7th - facial strength symmetric 8th - hearing intact 9th - palate elevates symmetrically, uvula midline 11th - shoulder shrug symmetric 12th - tongue protrusion midline  MOTOR:  normal bulk and tone, full strength in the RUE, RLE LUE AND LLE INCREASED TONE; LUE 3-4; LLE 3-4  SENSORY:  normal and symmetric to light touch, temperature, vibration; MILD ALLODYNIA IN LUE  COORDINATION:  finger-nose-finger, fine finger movements normal  REFLEXES:  deep tendon reflexes 1+ and symmetric  GAIT/STATION:  Left hemiparetic gait; in wheelchair     DIAGNOSTIC DATA (LABS, IMAGING, TESTING) - I reviewed patient records, labs, notes, testing and imaging myself where available.  Lab Results  Component Value Date   WBC 6.6 10/13/2021   HGB 11.7 (L) 10/13/2021   HCT  35.4 (L) 10/13/2021   MCV 91.0 10/13/2021   PLT 366 10/13/2021      Component Value Date/Time   NA 139 10/31/2021 0616   K 3.6 10/31/2021 0616   CL 106 10/31/2021 0616   CO2 25 10/31/2021 0616   GLUCOSE 111 (H) 10/31/2021 0616   BUN 13 10/31/2021 0616   CREATININE 0.61 10/31/2021 0616   CALCIUM 9.8 10/31/2021 0616   PROT 6.5 10/02/2021 0641   ALBUMIN 3.1 (L) 10/02/2021 0641   AST 20 10/02/2021 0641   ALT 22 10/02/2021 0641   ALKPHOS 85 10/02/2021 0641   BILITOT 0.5 10/02/2021 0641   GFRNONAA >60 10/31/2021 0616   Lab Results  Component Value Date   CHOL 211 (H) 09/27/2021   HDL 41 09/27/2021   LDLCALC 134 (H) 09/27/2021   TRIG 180 (H) 09/27/2021   CHOLHDL 5.1 09/27/2021   Lab Results  Component Value Date   HGBA1C 5.9 (H) 09/25/2021   No results found for: "VITAMINB12" No results found for: "TSH"   09/25/21 CT head [I reviewed images myself and agree with interpretation. -VRP]  Hemorrhage centered in the right thalamus and basal ganglia, with mild surrounding edema, minimal midline shift, and a small amount of intraventricular extension into the third and fourth ventricles.  10/11/21 CT head  [I reviewed images myself and agree with interpretation. -VRP]  1. Fading blood products within the right thalamus. Regional hypodensity persists and might reflect a combination of residual edema and developing myelomalacia. Mild regional mass effect seems diminished since 10/02/2021. 2. No new intracranial abnormality.    ASSESSMENT AND PLAN  67 y.o. year old female here with:   Dx:  1. Nontraumatic thalamic hemorrhage ([name])      PLAN:  RIGHT THALAMIC INTRACEREBRAL HEMORRHAGE (in setting of eliquis use) - continue BP control -Patient not interested in restarting anticoagulation; has seen cardiology who agree, as patient also has not had recurrence of atrial fibrillation; patient returns atrial fibrillation in the future, would recommend repeating CT of the head to ensure that blood products have resolved before restarting anticoagulation - continue gabapentin for post-stroke pain   Return for pending if symptoms worsen or fail to improve, return to PCP.    Penni Bombard, MD [id number], [id number] PM Certified in Neurology, Neurophysiology and Neuroimaging  [facility] [address], [place], [place] [address] [phone]

## 2021-12-28 ENCOUNTER — Other Ambulatory Visit: Payer: Self-pay

## 2021-12-28 MED ORDER — METHOCARBAMOL 500 MG PO TABS: 500.0000 mg | ORAL_TABLET | Freq: Three times a day (TID) | ORAL | 1 refills | Status: DC | PRN

## 2022-01-03 ENCOUNTER — Ambulatory Visit: Payer: Medicare Other | Attending: Internal Medicine | Admitting: Physical Therapy

## 2022-01-03 ENCOUNTER — Encounter: Payer: Self-pay | Admitting: Physical Therapy

## 2022-01-03 DIAGNOSIS — R29818 Other symptoms and signs involving the nervous system: Secondary | ICD-10-CM | POA: Insufficient documentation

## 2022-01-03 DIAGNOSIS — R2681 Unsteadiness on feet: Secondary | ICD-10-CM | POA: Diagnosis present

## 2022-01-03 DIAGNOSIS — I69154 Hemiplegia and 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 left non-dominant side: Secondary | ICD-10-CM | POA: Insufficient documentation

## 2022-01-03 DIAGNOSIS — M6281 Muscle weakness (generalized): Secondary | ICD-10-CM | POA: Insufficient documentation

## 2022-01-03 DIAGNOSIS — I619 Nontraumatic intracerebral hemorrhage, unspecified: Secondary | ICD-10-CM | POA: Insufficient documentation

## 2022-01-03 DIAGNOSIS — R2689 Other abnormalities of gait and mobility: Secondary | ICD-10-CM | POA: Insufficient documentation

## 2022-01-03 NOTE — Therapy (Signed)
OUTPATIENT PHYSICAL THERAPY NEURO EVALUATION   Patient Name: Charlotte Hunter MRN: [id number] DOB:05-10-1955, 66 y.o., female Today's Date: 01/03/2022   PCP: Su Grand, MD REFERRING PROVIDER: Bayard Hugger, NP    PT End of Session - 01/03/22 1909     Visit Number 1    Number of Visits 29    Date for PT Re-Evaluation 03/29/22    Authorization Type Medicare    Authorization Time Period 01-03-22 - 04-03-22    Progress Note Due on Visit 10    PT Start Time 1105    PT Stop Time 1150    PT Time Calculation (min) 45 min    Equipment Utilized During Treatment Gait belt;Other (comment)   RW   Activity Tolerance Patient tolerated treatment well    Behavior During Therapy WFL for tasks assessed/performed             Past Medical History:  Diagnosis Date   A-fib [facility])    Aortic insufficiency    Hypertension    Stroke [facility])    Past Surgical History:  Procedure Laterality Date   ABDOMINAL HYSTERECTOMY     ABLATION  05/03/2021   heart   c section  01/1968   KNEE SURGERY Right    meniscus   SHOULDER SURGERY Right 08/2014   Patient Active Problem List   Diagnosis Date Noted   ICH (intracerebral hemorrhage) ([place]) 10/01/2021   Paroxysmal atrial fibrillation ([place]) 09/29/2021   Stenosis of intracranial vessel 09/29/2021   Essential hypertension 09/29/2021   Blurry vision 09/29/2021   GERD (gastroesophageal reflux disease) 09/29/2021   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 09/25/2021    ONSET DATE: 09-25-21  REFERRING DIAG: I61.9 (ICD-10-CM) - Hemorrhagic stroke ([place])   THERAPY DIAG:  Hemiplegia and 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 left non-dominant side ([place]) - Plan: PT plan of care cert/re-cert  Other abnormalities of gait and mobility - Plan: PT plan of care cert/re-cert  Muscle weakness (generalized) - Plan: PT plan of care cert/re-cert  Unsteadiness on feet - Plan: PT plan of care cert/re-cert  Other  symptoms and signs involving the nervous system - Plan: PT plan of care cert/re-cert  Rationale for Evaluation and Treatment Rehabilitation  SUBJECTIVE:                                                                                                                                                                                              SUBJECTIVE STATEMENT: Pt is a 66 yr old lady s/p hemorrhagic CVA on 09-25-21:  pt presented to [facility] with Lt sided weakness, headache, nausea, and blurry  vision.  Pt was admitted to [facility] on 09-25-21 and then admitted to inpatient rehab 10-01-21 - 11-06-21.  Pt states she just received custom Lt AFO from [employer] just prior to PT appt today. Reports she sustained Lt ankle sprain about a month ago and this has caused some setback in progress as this limited ambulation/standing  Pt accompanied by: family member daughter [name]  PERTINENT HISTORY: 66 year old female here for evaluation of right thalamic intracerebral hemorrhage. Patient is was admitted to the hospital in June 2023 for new onset left-sided weakness headache and nausea. She was found to have intracerebral hemorrhage in the right thalamus. Patient was on Eliquis anticoagulation for atrial fibrillation at the time and this was reversed medically. She was started on hypertonic saline, blood pressure control in ICU admission. Patient went to inpatient rehabilitation. Now patient back home. Still has left-sided weakness and left hand and arm and leg pain. Has been to cardiology who recommended to hold anticoagulation for now as patient is not in atrial fibrillation.   Past medical history of atrial fibrillation on Eliquis aortic insufficiency, hypertension, fibromyalgia  PAIN:  Are you having pain? Yes: NPRS scale: 7/10 Pain location: Lt foot - on anterior lateral side; reports previous strain about month and a half ago Pain description: constant, dull, aching Aggravating factors: swelling aggravates  it Relieving factors: ice  PRECAUTIONS: Fall  WEIGHT BEARING RESTRICTIONS No  FALLS: Has patient fallen in last 6 months? No  LIVING ENVIRONMENT: Lives with: daughter, [name] Lives in: House/apartment Stairs: No - Ramp has been built at front entrance Has following equipment at home: Programmer, multimedia, Environmental consultant - 2 wheeled, and bed side commode  PLOF: Independent; pt did gardening prior to CVA - was not employed prior to onset of CVA  PATIENT GOALS "be restored back to where I was"  OBJECTIVE:   DIAGNOSTIC FINDINGS: MRI HEAD IMPRESSION:   1. Unchanged size and morphology of acute intraparenchymal hemorrhage centered at the right thalamus. Surrounding edema and regional mass effect has mildly increased as compared to prior CT from 09/25/2021. 2. Intraventricular extension with small volume intraventricular hemorrhage, similar. No hydrocephalus or trapping. 3. Underlying moderate chronic microvascular ischemic disease.    COGNITION: Overall cognitive status: Within functional limits for tasks assessed   SENSATION: Light touch: Impaired   COORDINATION: Decreased LLE due to weakness/decreased AROM  EDEMA:  Edema noted in LLE and in Lt hand  MUSCLE TONE: LLE: Mild    POSTURE: No Significant postural limitations  LOWER EXTREMITY ROM:   RLE WNL's           LLE PROM WNL's; decreased AROM due to hemiparesis   LOWER EXTREMITY MMT:    MMT Right Eval Left Eval  Hip flexion WFL's 3-  Hip extension    Hip abduction    Hip adduction    Hip internal rotation    Hip external rotation    Knee flexion  3- (seated position)   Knee extension  4  Ankle dorsiflexion  2-  Ankle plantarflexion    Ankle inversion    Ankle eversion    (Blank rows = not tested)  BED MOBILITY:  To be assessed  TRANSFERS: Assistive device utilized: Environmental consultant - 2 wheeled from wheelchair to [employer] to stand: CGA and cues for correct hand placement Stand to sit: Min A for controlled  descent  GAIT: Gait pattern: decreased step length- Left, decreased stance time- Left, decreased stride length, decreased hip/knee flexion- Left, decreased ankle dorsiflexion- Left,  and decreased trunk rotation Distance walked: 25' Assistive device utilized: Environmental consultant - 2 wheeled Level of assistance: Min A Comments: pt wearing custom AFO on LLE - just received this am from [employer], prior to PT appt  FUNCTIONAL TESTs:  Pt unable to stand unsupported approx. 30 secs with SBA; able to reach approx. 2" outside BOS with CGA  PATIENT SURVEYS:  FOTO to be completed next session as not captured at initial eval  TODAY'S TREATMENT:  Pt gait trained approx. 58' with RW (no hand orthosis used due to discomfort and ability now to grip RW); min assist needed for safety; verbal cues given for hand placement with stand to sit transfer   PATIENT EDUCATION: Education details: eval results; POC with 12 week duration; pt request 3x/week if possible; discussed projected functional outcome and goals for pt at end of 12 week LOS, as pt expresses high expectation for return of function/strength; instructed pt to begin standing at counter for approx. 1-2" and increase time as able Person educated: Patient and Child(ren) Education method: Explanation Education comprehension: verbalized understanding   HOME EXERCISE PROGRAM: To be established    GOALS: Goals reviewed with patient? Yes  SHORT TERM GOALS: Target date: 01/31/2022  Pt will perform basic transfers wheelchair to mat with supervision toward Rt side. Baseline: CGA Goal status: INITIAL  2.  Amb. 115' with RW with CGA with AFO on LLE. Baseline: 55' with RW Goal status: INITIAL  3.  Pt will stand for at least 5" with UE support prn with SBA:  pt will be able to reach 6" anteriorly without LOB with SBA for increased safety & independence with ADL's. Baseline:  Goal status: INITIAL  4.  Perform bed mobility sit to/from supine with SBA.   Baseline: TBA Goal status: INITIAL  5.  Assess Berg balance test and establish LTG as appropriate. Baseline: TBA Goal status: INITIAL  6.  Independent in HEP for LLE strengthening. Baseline:  Goal status: INITIAL   LONG TERM GOALS: Target date: 03/28/2022  Modified independent household amb. with appropriate assistive device (RW or LBQC).  Baseline:  Goal status: INITIAL  2.  Pt will increase Berg balance test score by at least 12 points to demo improved balance and to reduce fall risk.  Baseline: to be assessed Goal status: INITIAL  3.  Amb. 350' with RW with SBA for increased community accessibility.  Baseline:  Goal status: INITIAL  4.  Negotiate 4 steps with use of handrail with SBA using step by step sequence. Baseline: unable to perform at eval Goal status: INITIAL  5.  Pt will improve TUG score by at least 15 secs to demo improved functional mobility and reduced fall risk. Baseline:  Goal status: INITIAL  6.  Pt will negotiate ramp and curb with RW with SBA for increased community accessibility.  Baseline:  Goal status: INITIAL  ASSESSMENT:  CLINICAL IMPRESSION: Patient is a 66 y.o. lady who was seen today for physical therapy evaluation and treatment for Lt hemiparesis due to hemorrhagic CVA sustained on 09-25-21. Pt was hospitalized at [facility] 6-13- 10-01-21 and transferred to inpatient rehab 10-01-21 - 11-06-21.  Pt received home health PT and OT until 01-02-22.  Pt is using custom manual wheelchair for mobility and is able to amb. Short distances (approx. 20') with min assist with RW.  Pt presents with decreased functional use and decreased strength LUE and LLE due to hemiparesis and also gait and balance deficits.  Pt able to stand approx. 1" unsupported  with SBA and able to reach approx. 2" with CGA.  Pt will benefit from PT to address LLE weakness, gait and balance deficits.   OBJECTIVE IMPAIRMENTS Abnormal gait, decreased activity tolerance, decreased  balance, decreased coordination, decreased endurance, decreased knowledge of use of DME, decreased ROM, decreased strength, impaired sensation, impaired tone, and impaired UE functional use.   ACTIVITY LIMITATIONS carrying, lifting, bending, standing, squatting, stairs, transfers, bed mobility, and locomotion level  PARTICIPATION LIMITATIONS: meal prep, cleaning, laundry, driving, shopping, community activity, and yard work  [place] and severity of deficits  are also affecting patient's functional outcome.   REHAB POTENTIAL: Good  CLINICAL DECISION MAKING: Evolving/moderate complexity  EVALUATION COMPLEXITY: Moderate  PLAN: PT FREQUENCY: 3x/week for initial 4 weeks, followed by 2x/week for 8 weeks  PT DURATION: 12 weeks  PLANNED INTERVENTIONS: Therapeutic exercises, Therapeutic activity, Neuromuscular re-education, Balance training, Gait training, Patient/Family education, Self Care, Stair training, Orthotic/Fit training, DME instructions, Aquatic Therapy, and Wheelchair mobility training  PLAN FOR NEXT SESSION: issue HEP for LLE strengthening; assess Glynda Jaeger, PT 01/03/2022, 8:34 PM

## 2022-01-07 ENCOUNTER — Ambulatory Visit: Payer: Medicare Other | Admitting: Physical Therapy

## 2022-01-08 ENCOUNTER — Encounter: Payer: Self-pay | Admitting: Physical Therapy

## 2022-01-08 ENCOUNTER — Ambulatory Visit: Payer: Medicare Other | Admitting: Physical Therapy

## 2022-01-08 DIAGNOSIS — I69154 Hemiplegia and 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 left non-dominant side: Secondary | ICD-10-CM

## 2022-01-08 DIAGNOSIS — R2689 Other abnormalities of gait and mobility: Secondary | ICD-10-CM

## 2022-01-08 DIAGNOSIS — M6281 Muscle weakness (generalized): Secondary | ICD-10-CM

## 2022-01-08 NOTE — Therapy (Signed)
OUTPATIENT PHYSICAL THERAPY NEURO TREATMENT NOTE   Patient Name: Charlotte Hunter MRN: [id number] DOB:07-Aug-1955, 66 y.o., female Today's Date: 01/08/2022   PCP: Charlotte Grand, MD REFERRING PROVIDER: Bayard Hugger, NP    PT End of Session - 01/08/22 1918     Visit Number 2    Number of Visits 29    Date for PT Re-Evaluation 03/29/22    Authorization Type Medicare    Authorization Time Period 01-03-22 - 04-03-22    Progress Note Due on Visit 10    PT Start Time 1534    PT Stop Time 1625    PT Time Calculation (min) 51 min    Equipment Utilized During Treatment Gait belt;Other (comment)   RW   Activity Tolerance Patient limited by pain;Other (comment)   c/o hip pain with gait training; c/o ankle pain during session   Behavior During Therapy WFL for tasks assessed/performed              Past Medical History:  Diagnosis Date   A-fib [facility])    Aortic insufficiency    Hypertension    Stroke [facility])    Past Surgical History:  Procedure Laterality Date   ABDOMINAL HYSTERECTOMY     ABLATION  05/03/2021   heart   c section  01/1968   KNEE SURGERY Right    meniscus   SHOULDER SURGERY Right 08/2014   Patient Active Problem List   Diagnosis Date Noted   ICH (intracerebral hemorrhage) ([place]) 10/01/2021   Paroxysmal atrial fibrillation ([place]) 09/29/2021   Stenosis of intracranial vessel 09/29/2021   Essential hypertension 09/29/2021   Blurry vision 09/29/2021   GERD (gastroesophageal reflux disease) 09/29/2021   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 09/25/2021    ONSET DATE: 09-25-21  REFERRING DIAG: I61.9 (ICD-10-CM) - Hemorrhagic stroke ([place])   THERAPY DIAG:  Hemiplegia and 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 left non-dominant side ([name])  Other abnormalities of gait and mobility  Muscle weakness (generalized)  Rationale for Evaluation and Treatment Rehabilitation  SUBJECTIVE:                                                                                                                                                                                               SUBJECTIVE STATEMENT: Pt reports increased Lt ankle pain- states she noticed it this morning when she was walking with daughter's assistance to the bathroom to take a shower; pt reports pain on Lt lateral anterior ankle - cause unknown. Pt reports transfers are improving - says she is doing a little better  Pt accompanied by: family member daughter Charlotte Hunter  PERTINENT HISTORY: 66 year old female here for evaluation of right thalamic intracerebral hemorrhage. Patient is was admitted to the hospital in June 2023 for new onset left-sided weakness headache and nausea. She was found to have intracerebral hemorrhage in the right thalamus. Patient was on Eliquis anticoagulation for atrial fibrillation at the time and this was reversed medically. She was started on hypertonic saline, blood pressure control in ICU admission. Patient went to inpatient rehabilitation. Now patient back home. Still has left-sided weakness and left hand and arm and leg pain. Has been to cardiology who recommended to hold anticoagulation for now as patient is not in atrial fibrillation.   Past medical history of atrial fibrillation on Eliquis aortic insufficiency, hypertension, fibromyalgia  PAIN:  Are you having pain? Yes: NPRS scale: 7-8/10 Pain location: Lt foot - on anterior lateral side; reports previous strain about month and a half ago Pain description: constant, dull, aching Aggravating factors: swelling aggravates it Relieving factors: ice , Voltaren gel   PRECAUTIONS: Fall  WEIGHT BEARING RESTRICTIONS No  FALLS: Has patient fallen in last 6 months? No  LIVING ENVIRONMENT: Lives with: daughter, Charlotte Hunter Lives in: House/apartment Stairs: No - Ramp has been built at front entrance Has following equipment at home: Programmer, multimedia, Environmental consultant - 2 wheeled, and bed side  commode  PLOF: Independent; pt did gardening prior to CVA - was not employed prior to onset of CVA  PATIENT GOALS "be restored back to where I was"  OBJECTIVE:   Pt transferred wheelchair to mat toward Rt side using squat pivot transfer with CGA; increased weight bearing on RLE during transfer  BED MOBILITY:   Pt transferred sit to supine to Rt side with SBA; pt able to transfer LE's onto mat table Pt needed cues to move LUE with transfer from supine to Rt sidelying in preparation for supine to sit transfer  THEREX: Pt performed bridging 3 reps 5 sec hold; bridging with RLE extension x 5 reps with tactile cueing Lt hip abduction with red theraband in hooklying position 10 reps (RLE held isometrically for stabilization in hooklying position) Rt hip abduction with red theraband 10 reps in hooklying with min assist to hold LLE stable isometrically  Lt hip flexion in hooklying position with red theraband 10 reps Lt heel slide with extension with min assist to maintain neutral position with extension -10 reps Lt hip extension control exercise in hooklying position (stepping out/in with Lt knee flexed) attempting to lift and lower with control with knee maintaining neutral position  SELF CARE:  pt's AFO, sock and shoe were removed to inspect skin for any redness or pressure areas due to pt's c/o Lt ankle pain during performance of the above exercises; mild edema noted on Lt lateral foot but no redness or pressure area was noted in area of c/o pain; redness was noted on distal end of Lt lateral foot due to pressure of end of foot plate of AFO (pt wearing shoe insert inside AFO for padding of foot plate); daughter states she is going to make appt at [place] for consult to determine if modification of AFO needed   TRANSFERS: Assistive device utilized: Environmental consultant - 2 wheeled from mat to [employer] to stand: CGA and cues for correct hand placement ; tactile and verbal cues to increase weight shift onto LLE  with sit to stand Stand to sit: Min A for controlled descent; cues to remove Lt hand from RW and reach back to w/c with Rt hand before sitting  GAIT: Gait pattern: decreased step length- Left, decreased stance time- Left, decreased stride length, decreased hip/knee flexion- Left, decreased ankle dorsiflexion- Left, and decreased trunk rotation; some mild Lt genu recurvatum occasionally in stance Distance walked: 23' - pt requested to sit down after this distance due to c/o hip pain and Lt ankle pain Assistive device utilized: Environmental consultant - 2 wheeled Level of assistance: Min A Comments: pt wearing custom AFO on LLE     PATIENT EDUCATION: Education details: HEP was initiated but no pictures given due to time constraint - plan to give pictures of HEP to pt next session; pt was given red theraband and daughter was instructed to add this resistance to Lt clam exercise (in hooklying) and also to do resisted Lt hip flexion in hooklying  Person educated: Patient and Child(ren) Education method: Explanation, demonstration; handout to be given next session Education comprehension: verbalized understanding, demonstration  Access Code: [email or web address] URL: [email or web address] Date: 01/08/2022 Prepared by: Ethelene Browns  Exercises - Bridge  - 1 x daily - 7 x weekly - 1 sets - 10 reps - 5 hold - Single Leg Bridge  - 1 x daily - 7 x weekly - 1 sets - 10 reps - 3 sec hold - Hooklying Clamshell with Resistance  - 1 x daily - 7 x weekly - 1 sets - 10 reps - 2-3 sec hold - Supine Hip Flexion with Resistance Loop  - 1 x daily - 7 x weekly - 3 sets - 10 reps - Supine Heel Slide  - 1 x daily - 7 x weekly - 3 sets - 10 reps - Stepping out/in with LEFT leg  - 1 x daily - 7 x weekly - 3 sets - 10 reps - Hooklying Hamstring Stretch with Strap  - 1 x daily - 7 x weekly - 1 sets - 1-2 reps - 20 sec hold   HOME EXERCISE PROGRAM: [place] - see above   GOALS: Goals reviewed with patient?  Yes  SHORT TERM GOALS: Target date: 01/31/2022  Pt will perform basic transfers wheelchair to mat with supervision toward Rt side. Baseline: CGA Goal status: INITIAL  2.  Amb. 115' with RW with CGA with AFO on LLE. Baseline: 64' with RW Goal status: INITIAL  3.  Pt will stand for at least 5" with UE support prn with SBA:  pt will be able to reach 6" anteriorly without LOB with SBA for increased safety & independence with ADL's. Baseline:  Goal status: INITIAL  4.  Perform bed mobility sit to/from supine with SBA.  Baseline: TBA Goal status: INITIAL  5.  Assess Berg balance test and establish LTG as appropriate. Baseline: TBA Goal status: INITIAL  6.  Independent in HEP for LLE strengthening. Baseline:  Goal status: INITIAL   LONG TERM GOALS: Target date: 03/28/2022  Modified independent household amb. with appropriate assistive device (RW or LBQC).  Baseline:  Goal status: INITIAL  2.  Pt will increase Berg balance test score by at least 12 points to demo improved balance and to reduce fall risk.  Baseline: to be assessed Goal status: INITIAL  3.  Amb. 350' with RW with SBA for increased community accessibility.  Baseline:  Goal status: INITIAL  4.  Negotiate 4 steps with use of handrail with SBA using step by step sequence. Baseline: unable to perform at eval Goal status: INITIAL  5.  Pt will improve TUG score by at least 15 secs to demo improved functional mobility and reduced fall  risk. Baseline:  Goal status: INITIAL  6.  Pt will negotiate ramp and curb with RW with SBA for increased community accessibility.  Baseline:  Goal status: INITIAL  ASSESSMENT:  CLINICAL IMPRESSION: PT session focused on establishing appropriate HEP for LLE strengthening; pt reported Lt ankle pain (slightly increased in today's session compared to c/o pain during initial eval on 01-03-22 (etiology of pain unknown) as no red area or pressure was noted after removal of AFO, sock and  shoe for skin inspection.  Pt reported pain with and without palpation with no significant increase in pain reported with weight bearing during gait training.  Amb. Distance only slightly increased from 25' during evaluation last week to 30' in today's session with c/o hip pain and ankle pain after amb. this distance with pt requesting need to sit down. Pt needs cues for correct hand placement with sit to/from stand transfers.  Cont with POC.    OBJECTIVE IMPAIRMENTS Abnormal gait, decreased activity tolerance, decreased balance, decreased coordination, decreased endurance, decreased knowledge of use of DME, decreased ROM, decreased strength, impaired sensation, impaired tone, and impaired UE functional use.   ACTIVITY LIMITATIONS carrying, lifting, bending, standing, squatting, stairs, transfers, bed mobility, and locomotion level  PARTICIPATION LIMITATIONS: meal prep, cleaning, laundry, driving, shopping, community activity, and yard work  [place] and severity of deficits  are also affecting patient's functional outcome.   REHAB POTENTIAL: Good  CLINICAL DECISION MAKING: Evolving/moderate complexity  EVALUATION COMPLEXITY: Moderate  PLAN: PT FREQUENCY: 3x/week for initial 4 weeks, followed by 2x/week for 8 weeks  PT DURATION: 12 weeks  PLANNED INTERVENTIONS: Therapeutic exercises, Therapeutic activity, Neuromuscular re-education, Balance training, Gait training, Patient/Family education, Self Care, Stair training, Orthotic/Fit training, DME instructions, Aquatic Therapy, and Wheelchair mobility training  PLAN FOR NEXT SESSION:  give pictures HEP (was not given on 01-08-22 due to time constraint); assess Berg; gait training with RW, SciFit    Keywon Mest[place], [place], PT 01/08/2022, 7:28 PM

## 2022-01-10 ENCOUNTER — Ambulatory Visit: Payer: Medicare Other | Admitting: Physical Therapy

## 2022-01-10 DIAGNOSIS — I69154 Hemiplegia and 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 left non-dominant side: Secondary | ICD-10-CM | POA: Diagnosis not present

## 2022-01-10 DIAGNOSIS — M6281 Muscle weakness (generalized): Secondary | ICD-10-CM

## 2022-01-10 DIAGNOSIS — R2689 Other abnormalities of gait and mobility: Secondary | ICD-10-CM

## 2022-01-10 DIAGNOSIS — R2681 Unsteadiness on feet: Secondary | ICD-10-CM

## 2022-01-10 NOTE — Therapy (Signed)
OUTPATIENT PHYSICAL THERAPY NEURO TREATMENT NOTE   Patient Name: Charlotte Hunter MRN: [id number] DOB:11-24-1955, 66 y.o., female Today's Date: 01/10/2022   PCP: Su Grand, MD REFERRING PROVIDER: Bayard Hugger, NP    PT End of Session - 01/10/22 1325     Visit Number 3    Number of Visits 29    Date for PT Re-Evaluation 03/29/22    Authorization Type Medicare    Authorization Time Period 01-03-22 - 04-03-22    Progress Note Due on Visit 10    PT Start Time 1324   pt late   PT Stop Time 1402    PT Time Calculation (min) 38 min    Equipment Utilized During Treatment Other (comment)   L AFO   Activity Tolerance Patient limited by pain;Other (comment)   L ankle pain with use of AFO   Behavior During Therapy WFL for tasks assessed/performed               Past Medical History:  Diagnosis Date   A-fib [facility])    Aortic insufficiency    Hypertension    Stroke [facility])    Past Surgical History:  Procedure Laterality Date   ABDOMINAL HYSTERECTOMY     ABLATION  05/03/2021   heart   c section  01/1968   KNEE SURGERY Right    meniscus   SHOULDER SURGERY Right 08/2014   Patient Active Problem List   Diagnosis Date Noted   ICH (intracerebral hemorrhage) ([place]) 10/01/2021   Paroxysmal atrial fibrillation ([place]) 09/29/2021   Stenosis of intracranial vessel 09/29/2021   Essential hypertension 09/29/2021   Blurry vision 09/29/2021   GERD (gastroesophageal reflux disease) 09/29/2021   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 09/25/2021    ONSET DATE: 09-25-21  REFERRING DIAG: I61.9 (ICD-10-CM) - Hemorrhagic stroke ([place])   THERAPY DIAG:  Hemiplegia and 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 left non-dominant side ([name])  Other abnormalities of gait and mobility  Muscle weakness (generalized)  Unsteadiness on feet  Rationale for Evaluation and Treatment Rehabilitation  SUBJECTIVE:                                                                                                                                                                                               SUBJECTIVE STATEMENT: Pt arrives late to scheduled PT appointment. Pt reports ongoing L lateral ankle pain from AFO. Pt reports "spraining" her L ankle about 1.5 months ago at home, had a mobile xray done by [facility] to rule out fracture. Pt reports she has an appointment with ortho on Monday to get another xray done.  Ongoing edema in distal LLE.  Pt accompanied by: family member daughter [name]  PERTINENT HISTORY: 66 year old female here for evaluation of right thalamic intracerebral hemorrhage. Patient is was admitted to the hospital in June 2023 for new onset left-sided weakness headache and nausea. She was found to have intracerebral hemorrhage in the right thalamus. Patient was on Eliquis anticoagulation for atrial fibrillation at the time and this was reversed medically. She was started on hypertonic saline, blood pressure control in ICU admission. Patient went to inpatient rehabilitation. Now patient back home. Still has left-sided weakness and left hand and arm and leg pain. Has been to cardiology who recommended to hold anticoagulation for now as patient is not in atrial fibrillation.   Past medical history of atrial fibrillation on Eliquis aortic insufficiency, hypertension, fibromyalgia  PAIN:  Are you having pain? Yes: NPRS scale: 7-8/10 Pain location: Lt foot - on anterior lateral side; reports previous strain about month and a half ago Pain description: constant, dull, aching Aggravating factors: swelling aggravates it Relieving factors: ice , Voltaren gel   PRECAUTIONS: Fall  WEIGHT BEARING RESTRICTIONS No  FALLS: Has patient fallen in last 6 months? No  LIVING ENVIRONMENT: Lives with: daughter, [name] Lives in: House/apartment Stairs: No - Ramp has been built at front entrance Has following equipment at home: Programmer, multimedia,  Environmental consultant - 2 wheeled, and bed side commode  PLOF: Independent; pt did gardening prior to CVA - was not employed prior to onset of CVA  PATIENT GOALS "be restored back to where I was"  OBJECTIVE:  Pt reports urge to toilet after arrival to PT session. Pt's daughter declines to assist with transfer at this time, pt taken to bathroom by this therapist. Sit to stand and stand pivot transfer from w/c to toilet with CGA and use of grab bar on R side of toilet. Pt requires assist for clothing management before/after toileting. Pt able to continently void while seated on toilet and perform pericare independently.  Pt returned to therapy gym after toileting. Squat pivot transfer w/c to/from mat table to pt's R side with CGA, cues for sequencing of LE movement. While seated on mat removed pt's L shoe and AFO to perform skin inspection due to ongoing complaints of L ankle pain from AFO. Pt noted to have some redness on great toe and lateral portion of pinky toe, no redness noted at site of complaints of pain (lateral L ankle just below malleolus). Pt does report tenderness to touch at this site and exhibits some edema of ankle. Encouraged pt to wear compression stocking on L side. Pt's daughter reports pt does have an appointment with [facility] later this afternoon for further assessment of L AFO adjustments.  Provided handout for HEP created last session, encouraged pt to start working on these at home and return with any questions next session.   PATIENT EDUCATION: Education details: HEP handout provided Person educated: Patient and Child(ren) Education method: Explanation, Handout Education comprehension: verbalized understanding, demonstration   HOME EXERCISE PROGRAM: Access Code: [email or web address] URL: [email or web address] Date: 01/08/2022 Prepared by: Ethelene Browns  Exercises - Bridge  - 1 x daily - 7 x weekly - 1 sets - 10 reps - 5 hold - Single Leg Bridge  - 1 x daily - 7 x weekly -  1 sets - 10 reps - 3 sec hold - Hooklying Clamshell with Resistance  - 1 x daily - 7 x weekly - 1 sets - 10 reps - 2-3 sec hold -  Supine Hip Flexion with Resistance Loop  - 1 x daily - 7 x weekly - 3 sets - 10 reps - Supine Heel Slide  - 1 x daily - 7 x weekly - 3 sets - 10 reps - Stepping out/in with LEFT leg  - 1 x daily - 7 x weekly - 3 sets - 10 reps - Hooklying Hamstring Stretch with Strap  - 1 x daily - 7 x weekly - 1 sets - 1-2 reps - 20 sec hold  GOALS: Goals reviewed with patient? Yes  SHORT TERM GOALS: Target date: 01/31/2022  Pt will perform basic transfers wheelchair to mat with supervision toward Rt side. Baseline: CGA Goal status: INITIAL  2.  Amb. 115' with RW with CGA with AFO on LLE. Baseline: 42' with RW Goal status: INITIAL  3.  Pt will stand for at least 5" with UE support prn with SBA:  pt will be able to reach 6" anteriorly without LOB with SBA for increased safety & independence with ADL's. Baseline:  Goal status: INITIAL  4.  Perform bed mobility sit to/from supine with SBA.  Baseline: TBA Goal status: INITIAL  5.  Assess Berg balance test and establish LTG as appropriate. Baseline: TBA Goal status: INITIAL  6.  Independent in HEP for LLE strengthening. Baseline:  Goal status: INITIAL   LONG TERM GOALS: Target date: 03/28/2022  Modified independent household amb. with appropriate assistive device (RW or LBQC).  Baseline:  Goal status: INITIAL  2.  Pt will increase Berg balance test score by at least 12 points to demo improved balance and to reduce fall risk.  Baseline: to be assessed Goal status: INITIAL  3.  Amb. 350' with RW with SBA for increased community accessibility.  Baseline:  Goal status: INITIAL  4.  Negotiate 4 steps with use of handrail with SBA using step by step sequence. Baseline: unable to perform at eval Goal status: INITIAL  5.  Pt will improve TUG score by at least 15 secs to demo improved functional mobility and  reduced fall risk. Baseline:  Goal status: INITIAL  6.  Pt will negotiate ramp and curb with RW with SBA for increased community accessibility.  Baseline:  Goal status: INITIAL  ASSESSMENT:  CLINICAL IMPRESSION: Emphasis of skilled PT session on assisting pt with toilet transfer, assessing LLE skin integrity with use of AFO, and providing HEP handout for patient. This session limited by pt's late arrival and need to toilet during session. Pt continues to exhibit decreased ability to transfer safely and independently due to L hemi-body weakness and needing increased time and cues to complete transfers. Pt also exhibits ongoing decreased tolerance for wearing of L AFO due to ongoing pain since sprain 1.5 months ago. Pt to follow-up with [facility] later this afternoon for further assessment of AFO. Continue POC.   OBJECTIVE IMPAIRMENTS Abnormal gait, decreased activity tolerance, decreased balance, decreased coordination, decreased endurance, decreased knowledge of use of DME, decreased ROM, decreased strength, impaired sensation, impaired tone, and impaired UE functional use.   ACTIVITY LIMITATIONS carrying, lifting, bending, standing, squatting, stairs, transfers, bed mobility, and locomotion level  PARTICIPATION LIMITATIONS: meal prep, cleaning, laundry, driving, shopping, community activity, and yard work  Tatums and severity of deficits  are also affecting patient's functional outcome.   REHAB POTENTIAL: Good  CLINICAL DECISION MAKING: Evolving/moderate complexity  EVALUATION COMPLEXITY: Moderate  PLAN: PT FREQUENCY: 3x/week for initial 4 weeks, followed by 2x/week for 8 weeks  PT DURATION: 12 weeks  PLANNED INTERVENTIONS: Therapeutic exercises, Therapeutic activity, Neuromuscular re-education, Balance training, Gait training, Patient/Family education, Self Care, Stair training, Orthotic/Fit training, DME instructions, Aquatic Therapy, and Wheelchair mobility  training  PLAN FOR NEXT SESSION:  any questions on HEP?, any adjustments to L AFO from [employer]?, assess Berg; gait training with RW, SciFit    Excell Seltzer, PT, DPT, CSRS 01/10/2022, 2:51 PM

## 2022-01-14 ENCOUNTER — Telehealth: Payer: Self-pay | Admitting: *Deleted

## 2022-01-14 NOTE — Telephone Encounter (Signed)
Charlotte Hunter called and reports that she saw the orthopedic MD and  there was not a fx in her left ankle but feels it is nerve pain and is requesting that you increase her gabapentin.

## 2022-01-15 ENCOUNTER — Ambulatory Visit: Payer: Medicare Other | Attending: Internal Medicine | Admitting: Physical Therapy

## 2022-01-15 DIAGNOSIS — I69318 Other symptoms and signs involving cognitive functions following cerebral infarction: Secondary | ICD-10-CM | POA: Insufficient documentation

## 2022-01-15 DIAGNOSIS — R278 Other lack of coordination: Secondary | ICD-10-CM | POA: Insufficient documentation

## 2022-01-15 DIAGNOSIS — R208 Other disturbances of skin sensation: Secondary | ICD-10-CM | POA: Diagnosis present

## 2022-01-15 DIAGNOSIS — R2681 Unsteadiness on feet: Secondary | ICD-10-CM | POA: Insufficient documentation

## 2022-01-15 DIAGNOSIS — R2689 Other abnormalities of gait and mobility: Secondary | ICD-10-CM | POA: Diagnosis present

## 2022-01-15 DIAGNOSIS — M6281 Muscle weakness (generalized): Secondary | ICD-10-CM | POA: Diagnosis present

## 2022-01-15 DIAGNOSIS — I69154 Hemiplegia and 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 left non-dominant side: Secondary | ICD-10-CM | POA: Diagnosis present

## 2022-01-15 DIAGNOSIS — R29818 Other symptoms and signs involving the nervous system: Secondary | ICD-10-CM | POA: Diagnosis present

## 2022-01-15 MED ORDER — GABAPENTIN 300 MG PO CAPS: ORAL_CAPSULE | ORAL | 0 refills | Status: DC

## 2022-01-16 ENCOUNTER — Encounter: Payer: Self-pay | Admitting: Physical Therapy

## 2022-01-16 NOTE — Therapy (Signed)
OUTPATIENT PHYSICAL THERAPY NEURO TREATMENT NOTE   Patient Name: Charlotte Hunter MRN: [id number] DOB:1955-09-06, 66 y.o., female Today's Date: 01/16/2022   PCP: Su Grand, MD REFERRING PROVIDER: Bayard Hugger, NP    PT End of Session - 01/16/22 1732     Visit Number 4    Number of Visits 29    Date for PT Re-Evaluation 03/29/22    Authorization Type Medicare    Authorization Time Period 01-03-22 - 04-03-22    Progress Note Due on Visit 10    PT Start Time 1328   pt arrived late for appt   PT Stop Time 1401    PT Time Calculation (min) 33 min    Equipment Utilized During Treatment Gait belt   RW; pt wearing her AFO   Activity Tolerance Patient limited by pain   c/o pain in Rt knee with weight bearing   Behavior During Therapy WFL for tasks assessed/performed               Past Medical History:  Diagnosis Date   A-fib ([place] Hunter)    Aortic insufficiency    Hypertension    Stroke [facility])    Past Surgical History:  Procedure Laterality Date   ABDOMINAL HYSTERECTOMY     ABLATION  05/03/2021   heart   c section  01/1968   KNEE SURGERY Right    meniscus   SHOULDER SURGERY Right 08/2014   Patient Active Problem List   Diagnosis Date Noted   ICH (intracerebral hemorrhage) (Charlotte Hunter) 10/01/2021   Paroxysmal atrial fibrillation (Charlotte Hunter) 09/29/2021   Stenosis of intracranial vessel 09/29/2021   Essential hypertension 09/29/2021   Blurry vision 09/29/2021   GERD (gastroesophageal reflux disease) 09/29/2021   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 09/25/2021    ONSET DATE: 09-25-21  REFERRING DIAG: I61.9 (ICD-10-CM) - Hemorrhagic stroke ([place] Hunter)   THERAPY DIAG:  Other abnormalities of gait and mobility  Hemiplegia and 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 left non-dominant side ([name])  Muscle weakness (generalized)  Rationale for Evaluation and Treatment Rehabilitation  SUBJECTIVE:                                                                                                                                                                                               SUBJECTIVE STATEMENT: Pt arrives late to scheduled PT appt (approx. 13" late);  states she saw orthopedic yesterday and had x-ray done which showed no fractures; MD said the pain in Lt ankle was nerve pain and instructed her to increase Gabapentin dosage which she did and also recommended use of Lidocaine patch on  Lt foot to assist with pain management; pt brought Lidocaine patch unopened due to time constraint and requested that I place it on her foot for her. Pt reports she has been doing HEP - no questions about them at this time  Pt accompanied by: family member daughter [name]  PERTINENT HISTORY: 67 year old female here for evaluation of right thalamic intracerebral hemorrhage. Patient is was admitted to the hospital in June 2023 for new onset left-sided weakness headache and nausea. She was found to have intracerebral hemorrhage in the right thalamus. Patient was on Eliquis anticoagulation for atrial fibrillation at the time and this was reversed medically. She was started on hypertonic saline, blood pressure control in ICU admission. Patient went to inpatient rehabilitation. Now patient back home. Still has left-sided weakness and left hand and arm and leg pain. Has been to cardiology who recommended to hold anticoagulation for now as patient is not in atrial fibrillation.   Past medical history of atrial fibrillation on Eliquis aortic insufficiency, hypertension, fibromyalgia  PAIN:  Are you having pain? Yes: NPRS scale: 7-8/10 Pain location: Lt foot - on anterior lateral side; reports previous strain about month and a half ago Pain description: constant, dull, aching Aggravating factors: swelling aggravates it Relieving factors: ice , Voltaren gel   PRECAUTIONS: Fall  WEIGHT BEARING RESTRICTIONS No  FALLS: Has patient fallen in last 6  months? No  LIVING ENVIRONMENT: Lives with: daughter, [name] Lives in: House/apartment Stairs: No - Ramp has been built at front entrance Has following equipment at home: Programmer, multimedia, Environmental consultant - 2 wheeled, and bed side commode  PLOF: Independent; pt did gardening prior to CVA - was not employed prior to onset of CVA  PATIENT GOALS "be restored back to where I was"  OBJECTIVE:  Daughter placed Lidocaine patch on pt's Lt lateral foot  Pt transferred wheelchair to mat toward Rt side using stand pivot transfer with CGA  Gait:  Pt wearing her custom AFO on her LLE;   GAIT: Gait pattern: decreased step length- Left, decreased stance time- Left, decreased stride length, decreased hip/knee flexion- Left, decreased ankle dorsiflexion- Left, and decreased trunk rotation; some mild Lt genu recurvatum occasionally in stance Distance walked: 29' - pt requested to sit down after amb. Approx. 61' due to c/o Rt knee pain Assistive device utilized: Environmental consultant - 2 wheeled Level of assistance: Min A Comments: pt wearing custom AFO on LLE; Lidocaine patch was placed on Lt lateral foot prior to gait training  TherEx:  attempted runner's stretch for LLE in standing but pt unable to tolerate position due to c/o pain in Rt knee  Demonstrated heel cord stretch to pt and daughter - pt did not perform due to time constraint  PATIENT EDUCATION: Education details: Pt was instructed to try to perform heel cord stretch in standing with use of block or book approx. 2-3" high Person educated: Patient and Child(ren) Education method: Explanation, Demonstration Education comprehension: verbalized understanding, demonstration   HOME EXERCISE PROGRAM: Access Code: [email or web address] URL: [email or web address] Date: 01/08/2022 Prepared by: Ethelene Browns  Exercises - Bridge  - 1 x daily - 7 x weekly - 1 sets - 10 reps - 5 hold - Single Leg Bridge  - 1 x daily - 7 x weekly - 1 sets - 10 reps - 3 sec  hold - Hooklying Clamshell with Resistance  - 1 x daily - 7 x weekly - 1 sets - 10 reps - 2-3 sec hold - Supine Hip Flexion with Resistance  Loop  - 1 x daily - 7 x weekly - 3 sets - 10 reps - Supine Heel Slide  - 1 x daily - 7 x weekly - 3 sets - 10 reps - Stepping out/in with LEFT leg  - 1 x daily - 7 x weekly - 3 sets - 10 reps - Hooklying Hamstring Stretch with Strap  - 1 x daily - 7 x weekly - 1 sets - 1-2 reps - 20 sec hold  GOALS: Goals reviewed with patient? Yes  SHORT TERM GOALS: Target date: 01/31/2022  Pt will perform basic transfers wheelchair to mat with supervision toward Rt side. Baseline: CGA Goal status: INITIAL  2.  Amb. 115' with RW with CGA with AFO on LLE. Baseline: 5' with RW Goal status: INITIAL  3.  Pt will stand for at least 5" with UE support prn with SBA:  pt will be able to reach 6" anteriorly without LOB with SBA for increased safety & independence with ADL's. Baseline:  Goal status: INITIAL  4.  Perform bed mobility sit to/from supine with SBA.  Baseline: TBA Goal status: INITIAL  5.  Assess Berg balance test and establish LTG as appropriate. Baseline: TBA Goal status: INITIAL  6.  Independent in HEP for LLE strengthening. Baseline:  Goal status: INITIAL   LONG TERM GOALS: Target date: 03/28/2022  Modified independent household amb. with appropriate assistive device (RW or LBQC).  Baseline:  Goal status: INITIAL  2.  Pt will increase Berg balance test score by at least 12 points to demo improved balance and to reduce fall risk.  Baseline: to be assessed Goal status: INITIAL  3.  Amb. 350' with RW with SBA for increased community accessibility.  Baseline:  Goal status: INITIAL  4.  Negotiate 4 steps with use of handrail with SBA using step by step sequence. Baseline: unable to perform at eval Goal status: INITIAL  5.  Pt will improve TUG score by at least 15 secs to demo improved functional mobility and reduced fall risk. Baseline:   Goal status: INITIAL  6.  Pt will negotiate ramp and curb with RW with SBA for increased community accessibility.  Baseline:  Goal status: INITIAL  ASSESSMENT:  CLINICAL IMPRESSION: PT session was limited due to time constraint due to pt's late arrival and then increased time to place Lidocaine patch on Lt lateral foot.  Session focused on gait training with pt increasing ambulation distance from approx. 30' in previous session last week to 61' in today's session; this distance was limited by pt's c/o Rt knee pain with weight bearing.  Continue POC.   OBJECTIVE IMPAIRMENTS Abnormal gait, decreased activity tolerance, decreased balance, decreased coordination, decreased endurance, decreased knowledge of use of DME, decreased ROM, decreased strength, impaired sensation, impaired tone, and impaired UE functional use.   ACTIVITY LIMITATIONS carrying, lifting, bending, standing, squatting, stairs, transfers, bed mobility, and locomotion level  PARTICIPATION LIMITATIONS: meal prep, cleaning, laundry, driving, shopping, community activity, and yard work  PERSONAL FACTORS Fitness and severity of deficits  are also affecting patient's functional outcome.   REHAB POTENTIAL: Good  CLINICAL DECISION MAKING: Evolving/moderate complexity  EVALUATION COMPLEXITY: Moderate  PLAN: PT FREQUENCY: 3x/week for initial 4 weeks, followed by 2x/week for 8 weeks  PT DURATION: 12 weeks  PLANNED INTERVENTIONS: Therapeutic exercises, Therapeutic activity, Neuromuscular re-education, Balance training, Gait training, Patient/Family education, Self Care, Stair training, Orthotic/Fit training, DME instructions, Aquatic Therapy, and Wheelchair mobility training  PLAN FOR NEXT SESSION:  did pt try heel  cord stretch in standing? assess Merrilee Jansky; gait training with RW, SciFit    Kue Fox, [place], PT 01/16/2022, 5:37 PM

## 2022-01-17 ENCOUNTER — Encounter: Payer: Self-pay | Admitting: Physical Therapy

## 2022-01-17 ENCOUNTER — Ambulatory Visit: Payer: Medicare Other | Admitting: Physical Therapy

## 2022-01-17 DIAGNOSIS — R2681 Unsteadiness on feet: Secondary | ICD-10-CM

## 2022-01-17 DIAGNOSIS — R29818 Other symptoms and signs involving the nervous system: Secondary | ICD-10-CM

## 2022-01-17 DIAGNOSIS — R2689 Other abnormalities of gait and mobility: Secondary | ICD-10-CM

## 2022-01-17 DIAGNOSIS — I69154 Hemiplegia and 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 left non-dominant side: Secondary | ICD-10-CM

## 2022-01-17 NOTE — Therapy (Signed)
OUTPATIENT PHYSICAL THERAPY NEURO TREATMENT NOTE   Patient Name: Charlotte Hunter MRN: [id number] DOB:Oct 07, 1955, 67 y.o., female Today's Date: 01/17/2022   PCP: Su Grand, MD REFERRING PROVIDER: Bayard Hugger, NP    PT End of Session - 01/17/22 1620     Visit Number 5    Number of Visits 29    Date for PT Re-Evaluation 03/29/22    Authorization Type Medicare    Authorization Time Period 01-03-22 - 04-03-22    Progress Note Due on Visit 10    PT Start Time 1150    PT Stop Time 1240    PT Time Calculation (min) 50 min    Equipment Utilized During Treatment Gait belt;Other (comment)   RW; pt wearing her AFO   Activity Tolerance Patient tolerated treatment well   c/o pain in Rt knee with weight bearing   Behavior During Therapy WFL for tasks assessed/performed                Past Medical History:  Diagnosis Date   A-fib [facility])    Aortic insufficiency    Hypertension    Stroke [facility])    Past Surgical History:  Procedure Laterality Date   ABDOMINAL HYSTERECTOMY     ABLATION  05/03/2021   heart   c section  01/1968   KNEE SURGERY Right    meniscus   SHOULDER SURGERY Right 08/2014   Patient Active Problem List   Diagnosis Date Noted   ICH (intracerebral hemorrhage) ([place]) 10/01/2021   Paroxysmal atrial fibrillation ([place]) 09/29/2021   Stenosis of intracranial vessel 09/29/2021   Essential hypertension 09/29/2021   Blurry vision 09/29/2021   GERD (gastroesophageal reflux disease) 09/29/2021   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 09/25/2021    ONSET DATE: 09-25-21  REFERRING DIAG: I61.9 (ICD-10-CM) - Hemorrhagic stroke ([place])   THERAPY DIAG:  Hemiplegia and 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 left non-dominant side ([name])  Other abnormalities of gait and mobility  Other symptoms and signs involving the nervous system  Unsteadiness on feet  Rationale for Evaluation and Treatment  Rehabilitation  SUBJECTIVE:                                                                                                                                                                                              SUBJECTIVE STATEMENT: Pt states she has much more active movement now in her Lt hand and also in her left ankle with the increased Gabapentin dosage - was able to move her foot when she was getting dressed this morning; pt states that she has her Lidocaine patch on  her left foot and is "ready to go"; had busy day at home yesterday - had friends over for lunch - was able to get around in her wheelchair but has not done walking at home yet; has not done heel cord stretch in standing at home yet  Pt accompanied by: family member daughter [name]  PERTINENT HISTORY: 66 year old female here for evaluation of right thalamic intracerebral hemorrhage. Patient is was admitted to the hospital in June 2023 for new onset left-sided weakness headache and nausea. She was found to have intracerebral hemorrhage in the right thalamus. Patient was on Eliquis anticoagulation for atrial fibrillation at the time and this was reversed medically. She was started on hypertonic saline, blood pressure control in ICU admission. Patient went to inpatient rehabilitation. Now patient back home. Still has left-sided weakness and left hand and arm and leg pain. Has been to cardiology who recommended to hold anticoagulation for now as patient is not in atrial fibrillation.   Past medical history of atrial fibrillation on Eliquis aortic insufficiency, hypertension, fibromyalgia  PAIN:  Are you having pain? Yes: NPRS scale: 5/10 Pain location: Lt foot - on anterior lateral side; reports previous strain about month and a half ago Pain description: constant, dull, aching Aggravating factors: swelling aggravates it Relieving factors: ice , Voltaren gel   PRECAUTIONS: Fall  WEIGHT BEARING RESTRICTIONS No  FALLS: Has  patient fallen in last 6 months? No  LIVING ENVIRONMENT: Lives with: daughter, [name] Lives in: House/apartment Stairs: No - Ramp has been built at front entrance Has following equipment at home: Programmer, multimedia, Environmental consultant - 2 wheeled, and bed side commode  PLOF: Independent; pt did gardening prior to CVA - was not employed prior to onset of CVA  PATIENT GOALS "be restored back to where I was"  OBJECTIVE:   Pt transferred wheelchair to mat toward Rt side using stand pivot transfer with CGA  Lt AFO was removed to assess Lt ankle AROM - pt able to dorsiflex Lt foot with knee flexed approx. 30% ROM (in flexor synergy pattern) but unable to dorsiflex Lt foot with knee extended  Shoe donned Lt foot without AFO in order to perform Lt heel cord stretch in standing - used 2" block as wedge did not work initially due to pt's foot having tendency to supinate; pt stood with min to mod assist with RUE support for balance - approx. 30 secs x 2 reps for heel cord/gastroc stretching  Gait:  Pt wearing her custom AFO on her LLE;   GAIT: Gait pattern: decreased step length- Left, decreased stance time- Left, decreased stride length, decreased hip/knee flexion- Left, decreased ankle dorsiflexion- Left, and decreased trunk rotation; some mild Lt genu recurvatum occasionally in stance Distance walked: 35' x 2 reps - pt requested to sit down after amb. 35' due to c/o fatigue; pt stood up from chair after 1st rep gait training and turned around with RW with min assist Assistive device utilized: Environmental consultant - 2 wheeled Level of assistance: Min A Comments: pt wearing custom AFO on LLE; Lidocaine patch was placed on Lt lateral foot prior to gait training  Pt propelled herself in wheelchair approx. 100' from gym to lobby using RUE and RLE  NEURO-RE-ED:  pt stood statically beside mat without UE support with CGA to min assist - performed lateral weight shifting to increase weight on LLE - tactile cues given to  increase weight shift  TherEx:  Access Code: [email or web address] - issued on 01-17-22 URL: [email or web address] Date:  01/17/2022 Prepared by: Ethelene Browns  Exercises - Seated Soleus Stretch  - 2-3 x daily - 7 x weekly - 1 sets - 2 reps - 15-20 secs  hold - Slant Board Gastrocnemius Stretch  - 3 x daily - 7 x weekly - 1 sets - 2 reps - 15-20 secs hold - Seated Gastroc Stretch with Strap  - 2-3 x daily - 7 x weekly - 1 sets - 2 reps - 15-20 hold   PATIENT EDUCATION:  01-17-22 Education details: Medbridge  [email or web address] Person educated: Patient Education method: Explanation, Demonstration, Handout Education comprehension: verbalized understanding, demonstration   HOME EXERCISE PROGRAM:  Previously issued Access Code: [email or web address] URL: [email or web address] Date: 01/08/2022 Prepared by: Ethelene Browns  Exercises - Bridge  - 1 x daily - 7 x weekly - 1 sets - 10 reps - 5 hold - Single Leg Bridge  - 1 x daily - 7 x weekly - 1 sets - 10 reps - 3 sec hold - Hooklying Clamshell with Resistance  - 1 x daily - 7 x weekly - 1 sets - 10 reps - 2-3 sec hold - Supine Hip Flexion with Resistance Loop  - 1 x daily - 7 x weekly - 3 sets - 10 reps - Supine Heel Slide  - 1 x daily - 7 x weekly - 3 sets - 10 reps - Stepping out/in with LEFT leg  - 1 x daily - 7 x weekly - 3 sets - 10 reps - Hooklying Hamstring Stretch with Strap  - 1 x daily - 7 x weekly - 1 sets - 1-2 reps - 20 sec hold  GOALS: Goals reviewed with patient? Yes  SHORT TERM GOALS: Target date: 01/31/2022  Pt will perform basic transfers wheelchair to mat with supervision toward Rt side. Baseline: CGA Goal status: INITIAL  2.  Amb. 115' with RW with CGA with AFO on LLE. Baseline: 39' with RW Goal status: INITIAL  3.  Pt will stand for at least 5" with UE support prn with SBA:  pt will be able to reach 6" anteriorly without LOB with SBA for increased safety & independence with ADL's. Baseline:  Goal status:  INITIAL  4.  Perform bed mobility sit to/from supine with SBA.  Baseline: TBA Goal status: INITIAL  5.  Assess Berg balance test and establish LTG as appropriate. Baseline: TBA Goal status: INITIAL  6.  Independent in HEP for LLE strengthening. Baseline:  Goal status: INITIAL   LONG TERM GOALS: Target date: 03/28/2022  Modified independent household amb. with appropriate assistive device (RW or LBQC).  Baseline:  Goal status: INITIAL  2.  Pt will increase Berg balance test score by at least 12 points to demo improved balance and to reduce fall risk.  Baseline: to be assessed Goal status: INITIAL  3.  Amb. 350' with RW with SBA for increased community accessibility.  Baseline:  Goal status: INITIAL  4.  Negotiate 4 steps with use of handrail with SBA using step by step sequence. Baseline: unable to perform at eval Goal status: INITIAL  5.  Pt will improve TUG score by at least 15 secs to demo improved functional mobility and reduced fall risk. Baseline:  Goal status: INITIAL  6.  Pt will negotiate ramp and curb with RW with SBA for increased community accessibility.  Baseline:  Goal status: INITIAL  ASSESSMENT:  CLINICAL IMPRESSION: Pt demonstrating increased endurance and increased tolerance for weight bearing/gait training with use of RW; pt able to amb. 35' x  2 reps (70') total distance which is furthest pt has ambulated in OP PT admission.  Pt looks down during gait to assist with Lt foot placement due to decreased sensation in LLE.  Pt reported decreased sensation in LLE limited ability to feel stretch in Lt heel cord/gastroc in standing.  Continue POC.   OBJECTIVE IMPAIRMENTS Abnormal gait, decreased activity tolerance, decreased balance, decreased coordination, decreased endurance, decreased knowledge of use of DME, decreased ROM, decreased strength, impaired sensation, impaired tone, and impaired UE functional use.   ACTIVITY LIMITATIONS carrying, lifting,  bending, standing, squatting, stairs, transfers, bed mobility, and locomotion level  PARTICIPATION LIMITATIONS: meal prep, cleaning, laundry, driving, shopping, community activity, and yard work  [place] and severity of deficits  are also affecting patient's functional outcome.   REHAB POTENTIAL: Good  CLINICAL DECISION MAKING: Evolving/moderate complexity  EVALUATION COMPLEXITY: Moderate  PLAN: PT FREQUENCY: 3x/week for initial 4 weeks, followed by 2x/week for 8 weeks  PT DURATION: 12 weeks  PLANNED INTERVENTIONS: Therapeutic exercises, Therapeutic activity, Neuromuscular re-education, Balance training, Gait training, Patient/Family education, Self Care, Stair training, Orthotic/Fit training, DME instructions, Aquatic Therapy, and Wheelchair mobility training  PLAN FOR NEXT SESSION:  did pt try heel cord stretch in standing? Berg (??) - I had planned to do this today but ran out of time  - if you want to focus on gait training I will do Berg test with her next week (your choice); Sci[employer] if time allows - I told her she needs to start walking at home - if you want to just focus on gait that is fine.....   Alda Lea, PT 01/17/2022, 4:23 PM

## 2022-01-18 ENCOUNTER — Ambulatory Visit: Payer: Medicare Other | Admitting: Physical Therapy

## 2022-01-18 DIAGNOSIS — R2689 Other abnormalities of gait and mobility: Secondary | ICD-10-CM

## 2022-01-18 DIAGNOSIS — R2681 Unsteadiness on feet: Secondary | ICD-10-CM

## 2022-01-18 DIAGNOSIS — I69154 Hemiplegia and 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 left non-dominant side: Secondary | ICD-10-CM

## 2022-01-18 DIAGNOSIS — M6281 Muscle weakness (generalized): Secondary | ICD-10-CM

## 2022-01-18 DIAGNOSIS — R29818 Other symptoms and signs involving the nervous system: Secondary | ICD-10-CM

## 2022-01-18 NOTE — Therapy (Signed)
OUTPATIENT PHYSICAL THERAPY NEURO TREATMENT NOTE   Patient Name: Charlotte Hunter MRN: [id number] DOB:1955/11/12, 66 y.o., female Today's Date: 01/18/2022   PCP: Su Grand, MD REFERRING PROVIDER: Bayard Hugger, NP    PT End of Session - 01/18/22 1236     Visit Number 6    Number of Visits 29    Date for PT Re-Evaluation 03/29/22    Authorization Type Medicare    Authorization Time Period 01-03-22 - 04-03-22    Progress Note Due on Visit 10    PT Start Time [id number]   pt late   PT Stop Time 1318    PT Time Calculation (min) 43 min    Equipment Utilized During Treatment Gait belt;Other (comment)   RW; pt wearing her AFO   Activity Tolerance Patient tolerated treatment well   c/o pain in Rt knee with weight bearing   Behavior During Therapy WFL for tasks assessed/performed                 Past Medical History:  Diagnosis Date   A-fib ([place])    Aortic insufficiency    Hypertension    Stroke [facility])    Past Surgical History:  Procedure Laterality Date   ABDOMINAL HYSTERECTOMY     ABLATION  05/03/2021   heart   c section  01/1968   KNEE SURGERY Right    meniscus   SHOULDER SURGERY Right 08/2014   Patient Active Problem List   Diagnosis Date Noted   ICH (intracerebral hemorrhage) ([place]) 10/01/2021   Paroxysmal atrial fibrillation ([place]) 09/29/2021   Stenosis of intracranial vessel 09/29/2021   Essential hypertension 09/29/2021   Blurry vision 09/29/2021   GERD (gastroesophageal reflux disease) 09/29/2021   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 09/25/2021    ONSET DATE: 09-25-21  REFERRING DIAG: I61.9 (ICD-10-CM) - Hemorrhagic stroke ([place])   THERAPY DIAG:  Hemiplegia and 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 left non-dominant side ([name])  Other abnormalities of gait and mobility  Other symptoms and signs involving the nervous system  Unsteadiness on feet  Muscle weakness (generalized)  Rationale for  Evaluation and Treatment Rehabilitation  SUBJECTIVE:                                                                                                                                                                                              SUBJECTIVE STATEMENT: Pt reports use of lidocaine patch and Voltarin gel on her L ankle as well as increased gabapentin dosage have really helped her pain and pt reports "I have been feeling more like myself".  Pt accompanied by: family  member daughter [name]  PERTINENT HISTORY: 66 year old female here for evaluation of right thalamic intracerebral hemorrhage. Patient is was admitted to the hospital in June 2023 for new onset left-sided weakness headache and nausea. She was found to have intracerebral hemorrhage in the right thalamus. Patient was on Eliquis anticoagulation for atrial fibrillation at the time and this was reversed medically. She was started on hypertonic saline, blood pressure control in ICU admission. Patient went to inpatient rehabilitation. Now patient back home. Still has left-sided weakness and left hand and arm and leg pain. Has been to cardiology who recommended to hold anticoagulation for now as patient is not in atrial fibrillation.   Past medical history of atrial fibrillation on Eliquis aortic insufficiency, hypertension, fibromyalgia  PAIN:  Are you having pain? Yes: NPRS scale: 5/10 Pain location: Lt foot - on anterior lateral side; reports previous strain about month and a half ago Pain description: constant, dull, aching Aggravating factors: swelling aggravates it Relieving factors: ice , Voltaren gel   PRECAUTIONS: Fall  WEIGHT BEARING RESTRICTIONS No  FALLS: Has patient fallen in last 6 months? No  LIVING ENVIRONMENT: Lives with: daughter, [name] Lives in: House/apartment Stairs: No - Ramp has been built at front entrance Has following equipment at home: Programmer, multimedia, Environmental consultant - 2 wheeled, and bed side  commode  PLOF: Independent; pt did gardening prior to CVA - was not employed prior to onset of CVA  PATIENT GOALS "be restored back to where I was"  OBJECTIVE:  THER ACT:  [facility] PT Assessment - 01/18/22 1237       Standardized Balance Assessment   Standardized Balance Assessment Berg Balance Test      Berg Balance Test   Sit to Stand Needs minimal aid to stand or to stabilize    Standing Unsupported Able to stand 2 minutes with supervision    Sitting with Back Unsupported but Feet Supported on Floor or Stool Able to sit safely and securely 2 minutes    Stand to Sit Controls descent by using hands    Transfers Needs one person to assist    Standing Unsupported with Eyes Closed Needs help to keep from falling    Standing Unsupported with Feet Together Needs help to attain position but able to stand for 30 seconds with feet together    From Standing, Reach Forward with Outstretched Arm Loses balance while trying/requires external support    From Standing Position, Pick up Object from Floor Unable to try/needs assist to keep balance    From Standing Position, Turn to Look Behind Over each Shoulder Turn sideways only but maintains balance    Turn 360 Degrees Needs assistance while turning    Standing Unsupported, Alternately Place Feet on Step/Stool Needs assistance to keep from falling or unable to try    Standing Unsupported, One Foot in Front Needs help to step but can hold 15 seconds    Standing on One Leg Unable to try or needs assist to prevent fall    Total Score 16    Berg comment: 16/56, high fall risk            Towards end of session pt reports urge to toilet. Pt given option of continuing gait training with therapist and having her daughter assist with toileting after session or having this therapist take her the bathroom now (daughter not present until end of session). Pt requests to toilet at this time. Toilet transfer with CGA with use of grab bar, pt  able to perform  clothing management with CGA for standing balance. Pt returned to w/c at end of session and handed off to her daughter.  Gait: GAIT: Gait pattern: decreased stride length, decreased hip/knee flexion- Left, scissoring, and poor foot clearance- Left Distance walked: 76 ft Assistive device utilized: Walker - 2 wheeled Level of assistance: Min A Comments: use of custom AFO on LLE; progression from step-to gait pattern to step-through gait pattern   PATIENT EDUCATION:  01-18-22 Education details: continue HEP, keep working on walking at home Person educated: Patient Education method: Explanation Education comprehension: verbalized understanding, demonstration   HOME EXERCISE PROGRAM:  Previously issued Access Code: [email or web address] URL: [email or web address] Date: 01/08/2022 Prepared by: Maebelle Munroe  Exercises - Bridge  - 1 x daily - 7 x weekly - 1 sets - 10 reps - 5 hold - Single Leg Bridge  - 1 x daily - 7 x weekly - 1 sets - 10 reps - 3 sec hold - Hooklying Clamshell with Resistance  - 1 x daily - 7 x weekly - 1 sets - 10 reps - 2-3 sec hold - Supine Hip Flexion with Resistance Loop  - 1 x daily - 7 x weekly - 3 sets - 10 reps - Supine Heel Slide  - 1 x daily - 7 x weekly - 3 sets - 10 reps - Stepping out/in with LEFT leg  - 1 x daily - 7 x weekly - 3 sets - 10 reps - Hooklying Hamstring Stretch with Strap  - 1 x daily - 7 x weekly - 1 sets - 1-2 reps - 20 sec hold   Access Code: [email or web address] - issued on 01-17-22 URL: [email or web address] Date: 01/17/2022 Prepared by: Maebelle Munroe  Exercises - Seated Soleus Stretch  - 2-3 x daily - 7 x weekly - 1 sets - 2 reps - 15-20 secs  hold - Slant Board Gastrocnemius Stretch  - 3 x daily - 7 x weekly - 1 sets - 2 reps - 15-20 secs hold - Seated Gastroc Stretch with Strap  - 2-3 x daily - 7 x weekly - 1 sets - 2 reps - 15-20 hold  GOALS: Goals reviewed with patient? Yes  SHORT TERM GOALS: Target date: 01/31/2022  Pt  will perform basic transfers wheelchair to mat with supervision toward Rt side. Baseline: CGA Goal status: INITIAL  2.  Amb. 115' with RW with CGA with AFO on LLE. Baseline: 21' with RW Goal status: INITIAL  3.  Pt will stand for at least 5" with UE support prn with SBA:  pt will be able to reach 6" anteriorly without LOB with SBA for increased safety & independence with ADL's. Baseline:  Goal status: INITIAL  4.  Perform bed mobility sit to/from supine with SBA.  Baseline: TBA Goal status: INITIAL  5.  Pt will improve Berg score to 22/56 for decreased fall risk  Baseline: 16/56 (10/6) Goal status: INITIAL  6.  Independent in HEP for LLE strengthening. Baseline:  Goal status: INITIAL   LONG TERM GOALS: Target date: 03/28/2022  Modified independent household amb. with appropriate assistive device (RW or LBQC).  Baseline:  Goal status: INITIAL  2.  Pt will increase Berg balance test score by at least 12 points to demo improved balance and to reduce fall risk.  Baseline: 16/56 (10/6) Goal status: INITIAL  3.  Amb. 350' with RW with SBA for increased community accessibility.  Baseline:  Goal status: INITIAL  4.  Negotiate 4 steps with use of  handrail with SBA using step by step sequence. Baseline: unable to perform at eval Goal status: INITIAL  5.  Pt will improve TUG score by at least 15 secs to demo improved functional mobility and reduced fall risk. Baseline:  Goal status: INITIAL  6.  Pt will negotiate ramp and curb with RW with SBA for increased community accessibility.  Baseline:  Goal status: INITIAL  ASSESSMENT:  CLINICAL IMPRESSION: Emphasis of skilled PT session on working on assessing Berg, working on gait training, and assisting pt with toilet transfer. Patient demonstrates increased fall risk as noted by score of 16/56 on Berg Balance Scale.  (<36= high risk for falls, close to 100%; 37-45 significant >80%; 46-51 moderate >50%; 52-55 lower >25%).  Reviewed score and functional implications. Pt exhibits improved ability to ambulate longer distances this date before needing a rest break, is able to over double her previous distance. Pt continues to exhibit decreased weight shift onto L side and decreased safety and independence with functional mobility. Continue POC.   OBJECTIVE IMPAIRMENTS Abnormal gait, decreased activity tolerance, decreased balance, decreased coordination, decreased endurance, decreased knowledge of use of DME, decreased ROM, decreased strength, impaired sensation, impaired tone, and impaired UE functional use.   ACTIVITY LIMITATIONS carrying, lifting, bending, standing, squatting, stairs, transfers, bed mobility, and locomotion level  PARTICIPATION LIMITATIONS: meal prep, cleaning, laundry, driving, shopping, community activity, and yard work  [place] and severity of deficits  are also affecting patient's functional outcome.   REHAB POTENTIAL: Good  CLINICAL DECISION MAKING: Evolving/moderate complexity  EVALUATION COMPLEXITY: Moderate  PLAN: PT FREQUENCY: 3x/week for initial 4 weeks, followed by 2x/week for 8 weeks  PT DURATION: 12 weeks  PLANNED INTERVENTIONS: Therapeutic exercises, Therapeutic activity, Neuromuscular re-education, Balance training, Gait training, Patient/Family education, Self Care, Stair training, Orthotic/Fit training, DME instructions, Aquatic Therapy, and Wheelchair mobility training  PLAN FOR NEXT SESSION:  gait, working on components of [place], LLE NMR   Excell Seltzer, PT Excell Seltzer, PT, DPT, CSRS  01/18/2022, 1:18 PM

## 2022-01-21 ENCOUNTER — Encounter: Payer: Self-pay | Admitting: Physical Therapy

## 2022-01-21 ENCOUNTER — Ambulatory Visit: Payer: Medicare Other | Admitting: Physical Therapy

## 2022-01-21 DIAGNOSIS — R2689 Other abnormalities of gait and mobility: Secondary | ICD-10-CM

## 2022-01-21 DIAGNOSIS — I69154 Hemiplegia and 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 left non-dominant side: Secondary | ICD-10-CM

## 2022-01-21 NOTE — Therapy (Signed)
OUTPATIENT PHYSICAL THERAPY NEURO TREATMENT NOTE   Patient Name: Charlotte Hunter MRN: [id number] DOB:29-Jul-1955, 66 y.o., female Today's Date: 01/21/2022   PCP: Su Grand, MD REFERRING PROVIDER: Bayard Hugger, NP    PT End of Session - 01/21/22 1927     Visit Number 7    Number of Visits 29    Date for PT Re-Evaluation 03/29/22    Authorization Type Medicare    Authorization Time Period 01-03-22 - 04-03-22    Progress Note Due on Visit 10    PT Start Time 0934    PT Stop Time 1017    PT Time Calculation (min) 43 min    Equipment Utilized During Treatment Gait belt;Other (comment)   RW; pt wearing her AFO   Activity Tolerance Patient tolerated treatment well   c/o pain in Rt knee with weight bearing   Behavior During Therapy WFL for tasks assessed/performed                Past Medical History:  Diagnosis Date   A-fib [facility])    Aortic insufficiency    Hypertension    Stroke [facility])    Past Surgical History:  Procedure Laterality Date   ABDOMINAL HYSTERECTOMY     ABLATION  05/03/2021   heart   c section  01/1968   KNEE SURGERY Right    meniscus   SHOULDER SURGERY Right 08/2014   Patient Active Problem List   Diagnosis Date Noted   ICH (intracerebral hemorrhage) ([place]) 10/01/2021   Paroxysmal atrial fibrillation ([place]) 09/29/2021   Stenosis of intracranial vessel 09/29/2021   Essential hypertension 09/29/2021   Blurry vision 09/29/2021   GERD (gastroesophageal reflux disease) 09/29/2021   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 09/25/2021    ONSET DATE: 09-25-21  REFERRING DIAG: I61.9 (ICD-10-CM) - Hemorrhagic stroke ([place])   THERAPY DIAG:  Hemiplegia and 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 left non-dominant side ([name])  Other abnormalities of gait and mobility  Rationale for Evaluation and Treatment Rehabilitation  SUBJECTIVE:                                                                                                                                                                                               SUBJECTIVE STATEMENT: Pt reports the weekend was "rough" - states the pain has returned some in her Lt hand and in her leg and foot, even with the increased dosage of Gabapentin; pt reports numbness in Lt foot which is mostly constant; has not walked at home yet - states her daughter has been busy  Pt accompanied by: family member daughter  [name]  PERTINENT HISTORY: 66 year old female here for evaluation of right thalamic intracerebral hemorrhage. Patient is was admitted to the hospital in June 2023 for new onset left-sided weakness headache and nausea. She was found to have intracerebral hemorrhage in the right thalamus. Patient was on Eliquis anticoagulation for atrial fibrillation at the time and this was reversed medically. She was started on hypertonic saline, blood pressure control in ICU admission. Patient went to inpatient rehabilitation. Now patient back home. Still has left-sided weakness and left hand and arm and leg pain. Has been to cardiology who recommended to hold anticoagulation for now as patient is not in atrial fibrillation.   Past medical history of atrial fibrillation on Eliquis aortic insufficiency, hypertension, fibromyalgia  PAIN:  Are you having pain? Yes: NPRS scale: 7-8/10 Pain location: Lt foot - on anterior lateral side; reports previous strain about month and a half ago Pain description: constant, dull, aching Aggravating factors: swelling aggravates it Relieving factors: ice , Voltaren gel   PRECAUTIONS: Fall  WEIGHT BEARING RESTRICTIONS No  FALLS: Has patient fallen in last 6 months? No  LIVING ENVIRONMENT: Lives with: daughter, [name] Lives in: House/apartment Stairs: No - Ramp has been built at front entrance Has following equipment at home: Programmer, multimedia, Environmental consultant - 2 wheeled, and bed side commode  PLOF: Independent; pt did gardening  prior to CVA - was not employed prior to onset of CVA  PATIENT GOALS "be restored back to where I was"  OBJECTIVE:  TherAct: Pt transferred wheelchair to mat toward Rt side using stand pivot transfer with CGA at start of session; sit to stand from mat to RW with cues for correct hand placement during transfer  Transferred at end of session from wheelchair to mat toward Rt side with CGA to SBA; discussed set up of BSC and wheelchair in relation to pt's bed in order to problem solve placeement of each to maximize independence with transfers without having to move wheelchair    Gait:  Pt wearing her custom AFO on her LLE;   GAIT: Gait pattern: decreased step length- Left, decreased stance time- Left, decreased stride length, decreased hip/knee flexion- Left, decreased ankle dorsiflexion- Left, and decreased trunk rotation; some mild Lt genu recurvatum occasionally in stance Distance walked: 25' x 1 rep, then approx. 41' on 2nd rep - pt requested to sit down after amb. Each of these distances due to head not feeling right and feeling a little "jittery"  Assistive device utilized: Walker - 2 wheeled Level of assistance: Min A Comments: pt wearing custom AFO on LLE;  pt was wearing Lidocaine patch prior to start of session    TherEx:  Access Code: [place] - issued on 01-17-22 URL: [email or web address] Date: 01/17/2022 Prepared by: Ethelene Browns  Exercises - Seated Soleus Stretch  - 2-3 x daily - 7 x weekly - 1 sets - 2 reps - 15-20 secs  hold - Slant Board Gastrocnemius Stretch  - 3 x daily - 7 x weekly - 1 sets - 2 reps - 15-20 secs hold - Seated Gastroc Stretch with Strap  - 2-3 x daily - 7 x weekly - 1 sets - 2 reps - 15-20 hold   PATIENT EDUCATION:  01-17-22 Education details: Medbridge  [email or web address] Person educated: Patient Education method: Explanation, Demonstration, Handout Education comprehension: verbalized understanding, demonstration   HOME EXERCISE PROGRAM:   Previously issued Access Code: [email or web address] URL: [email or web address] Date: 01/08/2022 Prepared by: Ethelene Browns  Exercises - Bridge  - 1 x daily -  7 x weekly - 1 sets - 10 reps - 5 hold - Single Leg Bridge  - 1 x daily - 7 x weekly - 1 sets - 10 reps - 3 sec hold - Hooklying Clamshell with Resistance  - 1 x daily - 7 x weekly - 1 sets - 10 reps - 2-3 sec hold - Supine Hip Flexion with Resistance Loop  - 1 x daily - 7 x weekly - 3 sets - 10 reps - Supine Heel Slide  - 1 x daily - 7 x weekly - 3 sets - 10 reps - Stepping out/in with LEFT leg  - 1 x daily - 7 x weekly - 3 sets - 10 reps - Hooklying Hamstring Stretch with Strap  - 1 x daily - 7 x weekly - 1 sets - 1-2 reps - 20 sec hold  GOALS: Goals reviewed with patient? Yes  SHORT TERM GOALS: Target date: 01/31/2022  Pt will perform basic transfers wheelchair to mat with supervision toward Rt side. Baseline: CGA Goal status: INITIAL  2.  Amb. 115' with RW with CGA with AFO on LLE. Baseline: 51' with RW Goal status: INITIAL  3.  Pt will stand for at least 5" with UE support prn with SBA:  pt will be able to reach 6" anteriorly without LOB with SBA for increased safety & independence with ADL's. Baseline:  Goal status: INITIAL  4.  Perform bed mobility sit to/from supine with SBA.  Baseline: TBA Goal status: INITIAL  5.  Assess Berg balance test and establish LTG as appropriate. Baseline:  score 16/56 (tested on 01-18-22) Goal status: Goal met   6.  Independent in HEP for LLE strengthening. Baseline:  Goal status: INITIAL   LONG TERM GOALS: Target date: 03/28/2022  Modified independent household amb. with appropriate assistive device (RW or LBQC).  Baseline:  Goal status: INITIAL  2.  Pt will increase Berg balance test score by at least 12 points to demo improved balance and to reduce fall risk.  Baseline: to be assessed Goal status: INITIAL  3.  Amb. 350' with RW with SBA for increased community  accessibility.  Baseline:  Goal status: INITIAL  4.  Negotiate 4 steps with use of handrail with SBA using step by step sequence. Baseline: unable to perform at eval Goal status: INITIAL  5.  Pt will improve TUG score by at least 15 secs to demo improved functional mobility and reduced fall risk. Baseline:  Goal status: INITIAL  6.  Pt will negotiate ramp and curb with RW with SBA for increased community accessibility.  Baseline:  Goal status: INITIAL  ASSESSMENT:  CLINICAL IMPRESSION: Pt reported increased pain in Lt hand and LLE in today's session compared to that in previous sessions last week.  Pt also reported feeling "jittery" due to not having eaten prior to PT session and having taken medications.  Ambulation distance limited in today's session due to pt not feeling well.  Emphasized importance of need to amb. At home on daily basis (with assistance for safety).   Continue POC.   OBJECTIVE IMPAIRMENTS Abnormal gait, decreased activity tolerance, decreased balance, decreased coordination, decreased endurance, decreased knowledge of use of DME, decreased ROM, decreased strength, impaired sensation, impaired tone, and impaired UE functional use.   ACTIVITY LIMITATIONS carrying, lifting, bending, standing, squatting, stairs, transfers, bed mobility, and locomotion level  PARTICIPATION LIMITATIONS: meal prep, cleaning, laundry, driving, shopping, community activity, and yard work  [place] and severity of deficits  are  also affecting patient's functional outcome.   REHAB POTENTIAL: Good  CLINICAL DECISION MAKING: Evolving/moderate complexity  EVALUATION COMPLEXITY: Moderate  PLAN: PT FREQUENCY: 3x/week for initial 4 weeks, followed by 2x/week for 8 weeks  PT DURATION: 12 weeks  PLANNED INTERVENTIONS: Therapeutic exercises, Therapeutic activity, Neuromuscular re-education, Balance training, Gait training, Patient/Family education, Self Care, Stair training,  Orthotic/Fit training, DME instructions, Aquatic Therapy, and Wheelchair mobility training  PLAN FOR NEXT SESSION:  SciFit, gait training   Alda Lea, PT 01/21/2022, 7:31 PM

## 2022-01-22 ENCOUNTER — Ambulatory Visit: Payer: Medicare Other | Admitting: Physical Therapy

## 2022-01-22 DIAGNOSIS — R2689 Other abnormalities of gait and mobility: Secondary | ICD-10-CM | POA: Diagnosis not present

## 2022-01-22 DIAGNOSIS — M6281 Muscle weakness (generalized): Secondary | ICD-10-CM

## 2022-01-22 DIAGNOSIS — I69154 Hemiplegia and 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 left non-dominant side: Secondary | ICD-10-CM

## 2022-01-23 ENCOUNTER — Encounter: Payer: Self-pay | Admitting: Physical Therapy

## 2022-01-23 NOTE — Therapy (Signed)
OUTPATIENT PHYSICAL THERAPY NEURO TREATMENT NOTE   Patient Name: Charlotte Hunter MRN: [id number] DOB:August 09, 1955, 66 y.o., female Today's Date: 01/23/2022   PCP: Su Grand, MD REFERRING PROVIDER: Bayard Hugger, NP    PT End of Session - 01/23/22 1001     Visit Number 8    Number of Visits 29    Date for PT Re-Evaluation 03/29/22    Authorization Type Medicare    Authorization Time Period 01-03-22 - 04-03-22    Progress Note Due on Visit 10    PT Start Time 0932    PT Stop Time 1016    PT Time Calculation (min) 44 min    Equipment Utilized During Treatment Gait belt   RW; pt wearing her AFO   Activity Tolerance Patient tolerated treatment well   c/o pain in Rt knee with weight bearing   Behavior During Therapy WFL for tasks assessed/performed                Past Medical History:  Diagnosis Date   A-fib [facility])    Aortic insufficiency    Hypertension    Stroke [facility])    Past Surgical History:  Procedure Laterality Date   ABDOMINAL HYSTERECTOMY     ABLATION  05/03/2021   heart   c section  01/1968   KNEE SURGERY Right    meniscus   SHOULDER SURGERY Right 08/2014   Patient Active Problem List   Diagnosis Date Noted   ICH (intracerebral hemorrhage) ([place]) 10/01/2021   Paroxysmal atrial fibrillation ([place]) 09/29/2021   Stenosis of intracranial vessel 09/29/2021   Essential hypertension 09/29/2021   Blurry vision 09/29/2021   GERD (gastroesophageal reflux disease) 09/29/2021   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 09/25/2021    ONSET DATE: 09-25-21  REFERRING DIAG: I61.9 (ICD-10-CM) - Hemorrhagic stroke ([name])   THERAPY DIAG:  Other abnormalities of gait and mobility  Muscle weakness (generalized)  Hemiplegia and 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 left non-dominant side ([name])  Rationale for Evaluation and Treatment Rehabilitation  SUBJECTIVE:                                                                                                                                                                                               SUBJECTIVE STATEMENT: Pt reports she still hasn't been able to walk at home yet due to not having anyone to assist her; pt states she is getting around in her home more in the wheelchair; forgot to take picture of her bed/BSC set up  Pt accompanied by: family member daughter [name]  PERTINENT HISTORY: 66 year old female here for  evaluation of right thalamic intracerebral hemorrhage. Patient is was admitted to the hospital in June 2023 for new onset left-sided weakness headache and nausea. She was found to have intracerebral hemorrhage in the right thalamus. Patient was on Eliquis anticoagulation for atrial fibrillation at the time and this was reversed medically. She was started on hypertonic saline, blood pressure control in ICU admission. Patient went to inpatient rehabilitation. Now patient back home. Still has left-sided weakness and left hand and arm and leg pain. Has been to cardiology who recommended to hold anticoagulation for now as patient is not in atrial fibrillation.   Past medical history of atrial fibrillation on Eliquis aortic insufficiency, hypertension, fibromyalgia  PAIN:  Are you having pain? Yes: NPRS scale: 7/10 Pain location: Lt foot - on anterior lateral side; reports previous strain about month and a half ago Pain description: constant, dull, aching Aggravating factors: swelling aggravates it Relieving factors: ice , Voltaren gel   PRECAUTIONS: Fall  WEIGHT BEARING RESTRICTIONS No  FALLS: Has patient fallen in last 6 months? No  LIVING ENVIRONMENT: Lives with: daughter, [name] Lives in: House/apartment Stairs: No - Ramp has been built at front entrance Has following equipment at home: Programmer, multimedia, Environmental consultant - 2 wheeled, and bed side commode  PLOF: Independent; pt did gardening prior to CVA - was not employed prior to onset of  CVA  PATIENT GOALS "be restored back to where I was"  OBJECTIVE:  TherAct: Pt transferred wheelchair to mat toward Rt side using stand pivot transfer with CGA to SBA  Transferred at end of session from wheelchair to mat toward Rt side with CGA to SBA; discussed set up of BSC and wheelchair in relation to pt's bed in order to problem solve placeement of each to maximize independence with transfers without having to move wheelchair   Pt transferred sit to stand from high/low table with RLE on balance bubble to increase weight bearing on LLE during transfer - pt needed RUE support with mod assist to achieve upright standing position  Gait:  Pt wearing her custom AFO on her LLE;   GAIT: Gait pattern: decreased step length- Left, decreased stance time- Left, decreased stride length, decreased hip/knee flexion- Left, decreased ankle dorsiflexion- Left, and decreased trunk rotation; some mild Lt genu recurvatum occasionally in stance Distance walked: 65' x 1 on 1st rep of gait training;  73' 2nd rep of gait training Assistive device utilized: Environmental consultant - 2 wheeled Level of assistance: Min A; cues to increase weight shift onto LLE in stance phase of gait Comments: pt wearing custom AFO on LLE;  pt was wearing Lidocaine patch prior to start of session  Step training; pt used Rt hand rail with mod assist to ascend and descend steps; step by step sequence used; pt ascended with RLE leading and descended with LLE first; pt needed mod assist to abduct LLE in swing phase due to increased adductor tone   TherEx:  Access Code: [place] - issued on 01-17-22 URL: [email or web address] Date: 01/17/2022 Prepared by: Ethelene Browns  Exercises - Seated Soleus Stretch  - 2-3 x daily - 7 x weekly - 1 sets - 2 reps - 15-20 secs  hold - Slant Board Gastrocnemius Stretch  - 3 x daily - 7 x weekly - 1 sets - 2 reps - 15-20 secs hold - Seated Gastroc Stretch with Strap  - 2-3 x daily - 7 x weekly - 1  sets - 2 reps - 15-20 hold   PATIENT EDUCATION:  01-17-22 Education details: Medbridge  [email or web address] Person educated: Patient Education method: Explanation, Demonstration, Handout Education comprehension: verbalized understanding, demonstration   HOME EXERCISE PROGRAM:  Previously issued Access Code: [email or web address] URL: [email or web address] Date: 01/08/2022 Prepared by: Ethelene Browns  Exercises - Bridge  - 1 x daily - 7 x weekly - 1 sets - 10 reps - 5 hold - Single Leg Bridge  - 1 x daily - 7 x weekly - 1 sets - 10 reps - 3 sec hold - Hooklying Clamshell with Resistance  - 1 x daily - 7 x weekly - 1 sets - 10 reps - 2-3 sec hold - Supine Hip Flexion with Resistance Loop  - 1 x daily - 7 x weekly - 3 sets - 10 reps - Supine Heel Slide  - 1 x daily - 7 x weekly - 3 sets - 10 reps - Stepping out/in with LEFT leg  - 1 x daily - 7 x weekly - 3 sets - 10 reps - Hooklying Hamstring Stretch with Strap  - 1 x daily - 7 x weekly - 1 sets - 1-2 reps - 20 sec hold  GOALS: Goals reviewed with patient? Yes  SHORT TERM GOALS: Target date: 01/31/2022  Pt will perform basic transfers wheelchair to mat with supervision toward Rt side. Baseline: CGA Goal status: INITIAL  2.  Amb. 115' with RW with CGA with AFO on LLE. Baseline: 3' with RW Goal status: INITIAL  3.  Pt will stand for at least 5" with UE support prn with SBA:  pt will be able to reach 6" anteriorly without LOB with SBA for increased safety & independence with ADL's. Baseline:  Goal status: INITIAL  4.  Perform bed mobility sit to/from supine with SBA.  Baseline: TBA Goal status: INITIAL  5.  Assess Berg balance test and establish LTG as appropriate. Baseline:  score 16/56 (tested on 01-18-22) Goal status: Goal met   6.  Independent in HEP for LLE strengthening. Baseline:  Goal status: INITIAL   LONG TERM GOALS: Target date: 03/28/2022  Modified independent household amb. with appropriate assistive device (RW or  LBQC).  Baseline:  Goal status: INITIAL  2.  Pt will increase Berg balance test score by at least 12 points to demo improved balance and to reduce fall risk.  Baseline: to be assessed Goal status: INITIAL  3.  Amb. 350' with RW with SBA for increased community accessibility.  Baseline:  Goal status: INITIAL  4.  Negotiate 4 steps with use of handrail with SBA using step by step sequence. Baseline: unable to perform at eval Goal status: INITIAL  5.  Pt will improve TUG score by at least 15 secs to demo improved functional mobility and reduced fall risk. Baseline:  Goal status: INITIAL  6.  Pt will negotiate ramp and curb with RW with SBA for increased community accessibility.  Baseline:  Goal status: INITIAL  ASSESSMENT:  CLINICAL IMPRESSION: Pt very fearful and anxious with step negotiation with mod assist needed to abduct LLE during descension due to increased adductor tone.  Amb. Distance increased from 46' furthest distance on 01-21-22 to 54' in today's session. Gait speed remains very slow with pt looking down to ensure correct placement of LLE due to lack of sensation.  Continue POC.   OBJECTIVE IMPAIRMENTS Abnormal gait, decreased activity tolerance, decreased balance, decreased coordination, decreased endurance, decreased knowledge of use of DME, decreased ROM, decreased strength, impaired sensation, impaired tone, and impaired UE functional use.   ACTIVITY LIMITATIONS  carrying, lifting, bending, standing, squatting, stairs, transfers, bed mobility, and locomotion level  PARTICIPATION LIMITATIONS: meal prep, cleaning, laundry, driving, shopping, community activity, and yard work  [place] and severity of deficits  are also affecting patient's functional outcome.   REHAB POTENTIAL: Good  CLINICAL DECISION MAKING: Evolving/moderate complexity  EVALUATION COMPLEXITY: Moderate  PLAN: PT FREQUENCY: 3x/week for initial 4 weeks, followed by 2x/week for 8  weeks  PT DURATION: 12 weeks  PLANNED INTERVENTIONS: Therapeutic exercises, Therapeutic activity, Neuromuscular re-education, Balance training, Gait training, Patient/Family education, Self Care, Stair training, Orthotic/Fit training, DME instructions, Aquatic Therapy, and Wheelchair mobility training  PLAN FOR NEXT SESSION:  SciFit, gait training   Alda Lea, PT 01/23/2022, 10:24 AM

## 2022-01-24 ENCOUNTER — Ambulatory Visit: Payer: Medicare Other | Admitting: Physical Therapy

## 2022-01-24 ENCOUNTER — Encounter: Payer: Self-pay | Admitting: Physical Therapy

## 2022-01-24 DIAGNOSIS — R2681 Unsteadiness on feet: Secondary | ICD-10-CM

## 2022-01-24 DIAGNOSIS — M6281 Muscle weakness (generalized): Secondary | ICD-10-CM

## 2022-01-24 DIAGNOSIS — I69154 Hemiplegia and 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 left non-dominant side: Secondary | ICD-10-CM

## 2022-01-24 DIAGNOSIS — R2689 Other abnormalities of gait and mobility: Secondary | ICD-10-CM | POA: Diagnosis not present

## 2022-01-24 NOTE — Therapy (Signed)
OUTPATIENT PHYSICAL THERAPY NEURO TREATMENT NOTE   Patient Name: Charlotte Hunter MRN: [id number] DOB:May 11, 1955, 66 y.o., female Today's Date: 01/24/2022   PCP: Charlotte Grand, MD REFERRING PROVIDER: Bayard Hugger, NP    PT End of Session - 01/24/22 1908     Visit Number 9    Number of Visits 29    Date for PT Re-Evaluation 03/29/22    Authorization Type Medicare    Authorization Time Period 01-03-22 - 04-03-22    Progress Note Due on Visit 10    PT Start Time 0931    PT Stop Time 1015    PT Time Calculation (min) 44 min    Equipment Utilized During Treatment Gait belt   RW; pt wearing her AFO   Activity Tolerance Patient tolerated treatment well   c/o pain in Rt knee with weight bearing   Behavior During Therapy WFL for tasks assessed/performed                 Past Medical History:  Diagnosis Date   A-fib [facility])    Aortic insufficiency    Hypertension    Stroke [facility])    Past Surgical History:  Procedure Laterality Date   ABDOMINAL HYSTERECTOMY     ABLATION  05/03/2021   heart   c section  01/1968   KNEE SURGERY Right    meniscus   SHOULDER SURGERY Right 08/2014   Patient Active Problem List   Diagnosis Date Noted   ICH (intracerebral hemorrhage) ([place]) 10/01/2021   Paroxysmal atrial fibrillation ([place]) 09/29/2021   Stenosis of intracranial vessel 09/29/2021   Essential hypertension 09/29/2021   Blurry vision 09/29/2021   GERD (gastroesophageal reflux disease) 09/29/2021   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 09/25/2021    ONSET DATE: 09-25-21  REFERRING DIAG: I61.9 (ICD-10-CM) - Hemorrhagic stroke ([name])   THERAPY DIAG:  Other abnormalities of gait and mobility  Muscle weakness (generalized)  Unsteadiness on feet  Hemiplegia and 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 left non-dominant side ([name])  Rationale for Evaluation and Treatment Rehabilitation  SUBJECTIVE:                                                                                                                                                                                               SUBJECTIVE STATEMENT: Pt reports she has been doing exercises at home but has not walked at home yet - plans to do so next week when her brother and sister-in-law are here with her next week  Pt accompanied by: family member daughter Charlotte Hunter  PERTINENT HISTORY: 66 year old female here for evaluation of right  thalamic intracerebral hemorrhage. Patient is was admitted to the hospital in June 2023 for new onset left-sided weakness headache and nausea. She was found to have intracerebral hemorrhage in the right thalamus. Patient was on Eliquis anticoagulation for atrial fibrillation at the time and this was reversed medically. She was started on hypertonic saline, blood pressure control in ICU admission. Patient went to inpatient rehabilitation. Now patient back home. Still has left-sided weakness and left hand and arm and leg pain. Has been to cardiology who recommended to hold anticoagulation for now as patient is not in atrial fibrillation.   Past medical history of atrial fibrillation on Eliquis aortic insufficiency, hypertension, fibromyalgia  PAIN:  Are you having pain? Yes: NPRS scale: 6-7/10 Pain location: midline to Lt lateral area of buttock Pain description: constant, dull, aching Aggravating factors: swelling aggravates it Relieving factors: ice , Voltaren gel   PRECAUTIONS: Fall  WEIGHT BEARING RESTRICTIONS No  FALLS: Has patient fallen in last 6 months? No  LIVING ENVIRONMENT: Lives with: daughter, Charlotte Hunter Lives in: House/apartment Stairs: No - Ramp has been built at front entrance Has following equipment at home: Programmer, multimedia, Environmental consultant - 2 wheeled, and bed side commode  PLOF: Independent; pt did gardening prior to CVA - was not employed prior to onset of CVA  PATIENT GOALS "be restored back to where I  was"  OBJECTIVE:  TherEx:  Pt transferred wheelchair to mat toward Rt side using stand pivot transfer with CGA to SBA Pt performed sit to stand transfer from high/low mat table with RUE support 4 reps with wheelchair placed in front of her for support on push handles as needed; pt performed 5 reps sit to stand transfers without UE support with CGA Pt transferred to wheelchair toward Lt side using squat pivot transfer with CGA to min assist  SciFit level 1.0 x 5" with RUE and bil. LE's for LLE strengthening and cardiovascular conditioning  Gait:  Pt wearing her custom AFO on her LLE;   GAIT: Gait pattern: decreased step length- Left, decreased stance time- Left, decreased stride length, decreased hip/knee flexion- Left, decreased ankle dorsiflexion- Left, and decreased trunk rotation; some mild Lt genu recurvatum occasionally in stance Distance walked:  115' x 1 rep  Assistive device utilized: Environmental consultant - 2 wheeled Level of assistance: CGA;  cues to increase weight shift onto LLE in stance phase of gait Comments: pt wearing custom AFO on LLE;  pt was wearing Lidocaine patch prior to start of session  NeuroRe-ed: Pt stood unsupported with CGA - reached for 4 cones on Rt side (cones on computer table)  and placed on Lt side; then transferred from Lt side and placed on Rt side on computer table; furthest pt reached was approx. 6' outside BOS anteriorly and to the left side   TherEx:  Access Code: [place] - issued on 01-17-22 URL: [email or web address] Date: 01/17/2022 Prepared by: Ethelene Browns  Exercises - Seated Soleus Stretch  - 2-3 x daily - 7 x weekly - 1 sets - 2 reps - 15-20 secs  hold - Slant Board Gastrocnemius Stretch  - 3 x daily - 7 x weekly - 1 sets - 2 reps - 15-20 secs hold - Seated Gastroc Stretch with Strap  - 2-3 x daily - 7 x weekly - 1 sets - 2 reps - 15-20 hold   PATIENT EDUCATION:  01-17-22 Education details:  Discussed/reviewed STG's with pt as target  date for these is next week; emphasized importance of amb. In home  with assistance Person educated: Patient Education method: Explanation, Demonstration, Handout Education comprehension: verbalized understanding, demonstration   HOME EXERCISE PROGRAM:  Previously issued Access Code: [email or web address] URL: [email or web address] Date: 01/08/2022 Prepared by: Ethelene Browns  Exercises - Bridge  - 1 x daily - 7 x weekly - 1 sets - 10 reps - 5 hold - Single Leg Bridge  - 1 x daily - 7 x weekly - 1 sets - 10 reps - 3 sec hold - Hooklying Clamshell with Resistance  - 1 x daily - 7 x weekly - 1 sets - 10 reps - 2-3 sec hold - Supine Hip Flexion with Resistance Loop  - 1 x daily - 7 x weekly - 3 sets - 10 reps - Supine Heel Slide  - 1 x daily - 7 x weekly - 3 sets - 10 reps - Stepping out/in with LEFT leg  - 1 x daily - 7 x weekly - 3 sets - 10 reps - Hooklying Hamstring Stretch with Strap  - 1 x daily - 7 x weekly - 1 sets - 1-2 reps - 20 sec hold  GOALS: Goals reviewed with patient? Yes  SHORT TERM GOALS: Target date: 01/31/2022  Pt will perform basic transfers wheelchair to mat with supervision toward Rt side. Baseline: CGA Goal status: INITIAL  2.  Amb. 115' with RW with CGA with AFO on LLE. Baseline: 15' with RW Goal status: Goal met 01-24-22  3.  Pt will stand for at least 5" with UE support prn with SBA:  pt will be able to reach 6" anteriorly without LOB with SBA for increased safety & independence with ADL's. Baseline:  Goal status: INITIAL  4.  Perform bed mobility sit to/from supine with SBA.  Baseline: TBA Goal status: INITIAL  5.  Assess Berg balance test and establish LTG as appropriate. Baseline:  score 16/56 (tested on 01-18-22) Goal status: Goal met   6.  Independent in HEP for LLE strengthening. Baseline:  Goal status: Goal met 01-24-22   LONG TERM GOALS: Target date: 03/28/2022  Modified independent household amb. with appropriate assistive device (RW  or LBQC).  Baseline:  Goal status: INITIAL  2.  Pt will increase Berg balance test score by at least 12 points to demo improved balance and to reduce fall risk.  Baseline: to be assessed Goal status: INITIAL  3.  Amb. 350' with RW with SBA for increased community accessibility.  Baseline:  Goal status: INITIAL  4.  Negotiate 4 steps with use of handrail with SBA using step by step sequence. Baseline: unable to perform at eval Goal status: INITIAL  5.  Pt will improve TUG score by at least 15 secs to demo improved functional mobility and reduced fall risk. Baseline:  Goal status: INITIAL  6.  Pt will negotiate ramp and curb with RW with SBA for increased community accessibility.  Baseline:  Goal status: INITIAL  ASSESSMENT:  CLINICAL IMPRESSION: Pt has met STG's #2, 5 and 6:  other STG's to be assessed next week.  Pt amb. Furthest distance to date (115') with RW without requiring seated rest period.  Pt also performed SciFit exercise for first time in today's session.  Pt demonstrating improved endurance/activity tolerance.  Pt is progressing toward goals.  Cont with POC.  OBJECTIVE IMPAIRMENTS Abnormal gait, decreased activity tolerance, decreased balance, decreased coordination, decreased endurance, decreased knowledge of use of DME, decreased ROM, decreased strength, impaired sensation, impaired tone, and impaired UE functional use.   ACTIVITY LIMITATIONS carrying, lifting,  bending, standing, squatting, stairs, transfers, bed mobility, and locomotion level  PARTICIPATION LIMITATIONS: meal prep, cleaning, laundry, driving, shopping, community activity, and yard work  [place] and severity of deficits  are also affecting patient's functional outcome.   REHAB POTENTIAL: Good  CLINICAL DECISION MAKING: Evolving/moderate complexity  EVALUATION COMPLEXITY: Moderate  PLAN: PT FREQUENCY: 3x/week for initial 4 weeks, followed by 2x/week for 8 weeks  PT DURATION:  12 weeks  PLANNED INTERVENTIONS: Therapeutic exercises, Therapeutic activity, Neuromuscular re-education, Balance training, Gait training, Patient/Family education, Self Care, Stair training, Orthotic/Fit training, DME instructions, Aquatic Therapy, and Wheelchair mobility training  PLAN FOR NEXT SESSION:  10th visit progress note due next session: SciFit, gait training   Addis Bennie, [place], PT 01/24/2022, 7:14 PM

## 2022-01-25 ENCOUNTER — Ambulatory Visit: Payer: Medicare Other | Admitting: Physical Medicine & Rehabilitation

## 2022-01-28 ENCOUNTER — Ambulatory Visit: Payer: Medicare Other | Admitting: Physical Therapy

## 2022-01-28 ENCOUNTER — Encounter: Payer: Self-pay | Admitting: Physical Therapy

## 2022-01-28 DIAGNOSIS — R2689 Other abnormalities of gait and mobility: Secondary | ICD-10-CM

## 2022-01-28 DIAGNOSIS — M6281 Muscle weakness (generalized): Secondary | ICD-10-CM

## 2022-01-28 DIAGNOSIS — I69154 Hemiplegia and 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 left non-dominant side: Secondary | ICD-10-CM

## 2022-01-28 NOTE — Therapy (Signed)
OUTPATIENT PHYSICAL THERAPY NEURO TREATMENT NOTE/10th VISIT PROGRESS NOTE   Progress Note Reporting Period 01-03-22 to 01-28-22  See note below for Objective Data and Assessment of Progress/Goals.      Patient Name: Charlotte Hunter MRN: [id number] DOB:Dec 20, 1955, 66 y.o., female Today's Date: 01/28/2022   PCP: Su Grand, MD REFERRING PROVIDER: Bayard Hugger, NP    PT End of Session - 01/28/22 1238     Visit Number 10    Number of Visits 29    Date for PT Re-Evaluation 03/29/22    Authorization Type Medicare    Authorization Time Period 01-03-22 - 04-03-22    Progress Note Due on Visit 10    PT Start Time 0931    PT Stop Time 1015    PT Time Calculation (min) 44 min    Equipment Utilized During Treatment Gait belt   RW; pt wearing her AFO   Activity Tolerance Patient tolerated treatment well   c/o pain in Rt knee with weight bearing   Behavior During Therapy WFL for tasks assessed/performed                 Past Medical History:  Diagnosis Date   A-fib [facility])    Aortic insufficiency    Hypertension    Stroke [facility])    Past Surgical History:  Procedure Laterality Date   ABDOMINAL HYSTERECTOMY     ABLATION  05/03/2021   heart   c section  01/1968   KNEE SURGERY Right    meniscus   SHOULDER SURGERY Right 08/2014   Patient Active Problem List   Diagnosis Date Noted   ICH (intracerebral hemorrhage) ([place]) 10/01/2021   Paroxysmal atrial fibrillation ([place]) 09/29/2021   Stenosis of intracranial vessel 09/29/2021   Essential hypertension 09/29/2021   Blurry vision 09/29/2021   GERD (gastroesophageal reflux disease) 09/29/2021   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 09/25/2021    ONSET DATE: 09-25-21  REFERRING DIAG: I61.9 (ICD-10-CM) - Hemorrhagic stroke ([place])   THERAPY DIAG:  Other abnormalities of gait and mobility  Muscle weakness (generalized)  Hemiplegia and hemiparesis following nontraumatic intracerebral hemorrhage  affecting left non-dominant side ([place])  Rationale for Evaluation and Treatment Rehabilitation  SUBJECTIVE:                                                                                                                                                                                              SUBJECTIVE STATEMENT: Pt reports she did get a path cleared in her home - in living room area - so that she is now able to walk at home.  [facility] Friday  and Sunday with RW with assistance: Pt reporting the pain in her Lt buttock has returned - states it radiates around to the front of her hip (groin area); took Gabapentin this morning but states it is not helping a lot with this pain.  Pt has brought a picture of her bedroom with [facility] set up at home - would like to find a way that the wheelchair could be accessed from bedside commode without having to have someone move it or the wheelchair every time  Pt accompanied by: family member sister-in-law Juliann Pulse  PERTINENT HISTORY: 66 year old female here for evaluation of right thalamic intracerebral hemorrhage. Patient is was admitted to the hospital in June 2023 for new onset left-sided weakness headache and nausea. She was found to have intracerebral hemorrhage in the right thalamus. Patient was on Eliquis anticoagulation for atrial fibrillation at the time and this was reversed medically. She was started on hypertonic saline, blood pressure control in ICU admission. Patient went to inpatient rehabilitation. Now patient back home. Still has left-sided weakness and left hand and arm and leg pain. Has been to cardiology who recommended to hold anticoagulation for now as patient is not in atrial fibrillation.   Past medical history of atrial fibrillation on Eliquis aortic insufficiency, hypertension, fibromyalgia  PAIN:  Are you having pain? Yes: NPRS scale: 8/10 Pain location: midline to Lt lateral area of buttock Pain description: constant, dull,  aching Aggravating factors: swelling aggravates it Relieving factors: ice , Voltaren gel   PRECAUTIONS: Fall  WEIGHT BEARING RESTRICTIONS No  FALLS: Has patient fallen in last 6 months? No  LIVING ENVIRONMENT: Lives with: daughter, [name] Lives in: House/apartment Stairs: No - Ramp has been built at front entrance Has following equipment at home: Programmer, multimedia, Environmental consultant - 2 wheeled, and bed side commode  PLOF: Independent; pt did gardening prior to CVA - was not employed prior to onset of CVA  PATIENT GOALS "be restored back to where I was"  OBJECTIVE:  SELF CARE:   Pt has brought picture of her bedroom set up with bed against wall on Rt side; table on Lt side beside head of bed and BSC on Lt side parallel to bed, facing head of bed; pt would like to be able to independently transfer to BCS and then to wheelchair without having to have either moved to access the other.  Pt wants to keep BSC next to bed facing the hand rail because she states she uses rail to pull up (even though she has been instructed to PUSH up from [facility]) and likes the [facility] being next to bed on the Rt side so that it can not tip over.  Instructed pt and sister-in-law, Juliann Pulse, to practice transfers from various set ups and also consider moving bed away from wall to have accessibility on the Rt side of the bed.  Pt verbalized understanding and agreement.   TherEx:  Pt transferred wheelchair to mat toward Rt side using stand pivot transfer with CGA to SBA Pt performed sit to stand transfer from high/low mat table with RUE support 1 rep to RW with CGA   NuStep level 3 with RUE and bil. LE's for LLE strengthening and cardiovascular conditioning; Rt arm length on 10 setting, Lt arm length on 14 but pt unable to tolerate holding handle for the AROM - reported tingling pain so Rt hand was released  Gait:  Pt wearing her custom AFO on her LLE;   GAIT: Gait pattern: decreased step length-  Left, decreased stance time- Left,  decreased stride length, decreased hip/knee flexion- Left, decreased ankle dorsiflexion- Left, and decreased trunk rotation; some mild Lt genu recurvatum occasionally in stance Distance walked:  115' x 1 rep  Assistive device utilized: Environmental consultant - 2 wheeled Level of assistance: CGA;  cues to increase weight shift onto LLE in stance phase of gait Comments: pt wearing custom AFO on LLE;  pt was wearing Lidocaine patch prior to start of session     TherEx:  Access Code: [place] - issued on 01-17-22 URL: [email or web address] Date: 01/17/2022 Prepared by: Ethelene Browns  Exercises - Seated Soleus Stretch  - 2-3 x daily - 7 x weekly - 1 sets - 2 reps - 15-20 secs  hold - Slant Board Gastrocnemius Stretch  - 3 x daily - 7 x weekly - 1 sets - 2 reps - 15-20 secs hold - Seated Gastroc Stretch with Strap  - 2-3 x daily - 7 x weekly - 1 sets - 2 reps - 15-20 hold   PATIENT EDUCATION:  01-17-22 Education details:  Discussed/reviewed STG's with pt as target date for these is next week; emphasized importance of amb. In home with assistance Person educated: Patient Education method: Explanation, Demonstration, Handout Education comprehension: verbalized understanding, demonstration   HOME EXERCISE PROGRAM:  Previously issued Access Code: [email or web address] URL: [email or web address] Date: 01/08/2022 Prepared by: Ethelene Browns  Exercises - Bridge  - 1 x daily - 7 x weekly - 1 sets - 10 reps - 5 hold - Single Leg Bridge  - 1 x daily - 7 x weekly - 1 sets - 10 reps - 3 sec hold - Hooklying Clamshell with Resistance  - 1 x daily - 7 x weekly - 1 sets - 10 reps - 2-3 sec hold - Supine Hip Flexion with Resistance Loop  - 1 x daily - 7 x weekly - 3 sets - 10 reps - Supine Heel Slide  - 1 x daily - 7 x weekly - 3 sets - 10 reps - Stepping out/in with LEFT leg  - 1 x daily - 7 x weekly - 3 sets - 10 reps - Hooklying Hamstring Stretch with Strap  - 1 x daily - 7 x weekly - 1 sets - 1-2  reps - 20 sec hold  GOALS: Goals reviewed with patient? Yes  SHORT TERM GOALS: Target date: 01/31/2022  Pt will perform basic transfers wheelchair to mat with supervision toward Rt side. Baseline: CGA Goal status: Progressing  - 01-28-22: pt requires CGA to SBA  2.  Amb. 115' with RW with CGA with AFO on LLE. Baseline: 35' with RW Goal status: Goal met 01-24-22  3.  Pt will stand for at least 5" with UE support prn with SBA:  pt will be able to reach 6" anteriorly without LOB with SBA for increased safety & independence with ADL's. Baseline:  Goal status: INITIAL  4.  Perform bed mobility sit to/from supine with SBA.  Baseline: TBA Goal status: Partially met 01-28-22: pt uses rail on bed on Rt side to assist with supine to sit transfer; needs min assist to transfer supine to sit toward Lt side  5.  Assess Berg balance test and establish LTG as appropriate. Baseline:  score 16/56 (tested on 01-18-22) Goal status: Goal met   6.  Independent in HEP for LLE strengthening. Baseline:  Goal status: Goal met 01-24-22   LONG TERM GOALS: Target date: 03/28/2022  Modified independent household amb. with appropriate assistive device (RW or  LBQC).  Baseline:  Goal status: INITIAL  2.  Pt will increase Berg balance test score by at least 12 points to demo improved balance and to reduce fall risk.  Baseline: to be assessed Goal status: INITIAL  3.  Amb. 350' with RW with SBA for increased community accessibility.  Baseline:  Goal status: INITIAL  4.  Negotiate 4 steps with use of handrail with SBA using step by step sequence. Baseline: unable to perform at eval Goal status: INITIAL  5.  Pt will improve TUG score by at least 15 secs to demo improved functional mobility and reduced fall risk. Baseline:  Goal status: INITIAL  6.  Pt will negotiate ramp and curb with RW with SBA for increased community accessibility.  Baseline:  Goal status: INITIAL  ASSESSMENT:  CLINICAL  IMPRESSION: This 10th visit progress note covers dates 01-03-22 - 01-28-22.  Pt has met STG's #2, 5 and 6:  STG's #1 and 4 partially met as pt requires less assistance with transfers toward noninvolved side, both stand pivot transfers and with bed mobility including supine to sit transfers.  Ambulation distance remains at 11' prior to need for seated rest break.  Pt's standing tolerance is limited by Lt buttock/hip pain and also by Rt knee pain.   STG #3 to be assessed next session. Cont with POC.  OBJECTIVE IMPAIRMENTS Abnormal gait, decreased activity tolerance, decreased balance, decreased coordination, decreased endurance, decreased knowledge of use of DME, decreased ROM, decreased strength, impaired sensation, impaired tone, and impaired UE functional use.   ACTIVITY LIMITATIONS carrying, lifting, bending, standing, squatting, stairs, transfers, bed mobility, and locomotion level  PARTICIPATION LIMITATIONS: meal prep, cleaning, laundry, driving, shopping, community activity, and yard work  [place] and severity of deficits  are also affecting patient's functional outcome.   REHAB POTENTIAL: Good  CLINICAL DECISION MAKING: Evolving/moderate complexity  EVALUATION COMPLEXITY: Moderate  PLAN: PT FREQUENCY: 3x/week for initial 4 weeks, followed by 2x/week for 8 weeks  PT DURATION: 12 weeks  PLANNED INTERVENTIONS: Therapeutic exercises, Therapeutic activity, Neuromuscular re-education, Balance training, Gait training, Patient/Family education, Self Care, Stair training, Orthotic/Fit training, DME instructions, Aquatic Therapy, and Wheelchair mobility training  PLAN FOR NEXT SESSION:   Please check STG #3:  cont gait training with RW;  LLE strengthening    Marlos Carmen, [place], PT 01/28/2022, 12:40 PM

## 2022-01-29 ENCOUNTER — Other Ambulatory Visit: Payer: Self-pay | Admitting: Registered Nurse

## 2022-01-29 ENCOUNTER — Ambulatory Visit: Payer: Medicare Other | Admitting: Physical Therapy

## 2022-01-29 DIAGNOSIS — R278 Other lack of coordination: Secondary | ICD-10-CM

## 2022-01-29 DIAGNOSIS — R2681 Unsteadiness on feet: Secondary | ICD-10-CM

## 2022-01-29 DIAGNOSIS — R2689 Other abnormalities of gait and mobility: Secondary | ICD-10-CM

## 2022-01-29 DIAGNOSIS — M6281 Muscle weakness (generalized): Secondary | ICD-10-CM

## 2022-01-29 NOTE — Therapy (Signed)
OUTPATIENT PHYSICAL THERAPY NEURO TREATMENT NOTE       Patient Name: Charlotte Hunter MRN: [id number] DOB:1955-12-20, 66 y.o., female Today's Date: 01/29/2022   PCP: Charlotte Grand, MD REFERRING PROVIDER: Bayard Hugger, NP    PT End of Session - 01/29/22 1103     Visit Number 11    Number of Visits 29    Date for PT Re-Evaluation 03/29/22    Authorization Type Medicare    Authorization Time Period 01-03-22 - 04-03-22    Progress Note Due on Visit 10    PT Start Time 1102    PT Stop Time 1147    PT Time Calculation (min) 45 min    Equipment Utilized During Treatment Gait belt   RW; pt wearing her AFO   Activity Tolerance Patient tolerated treatment well    Behavior During Therapy WFL for tasks assessed/performed                  Past Medical History:  Diagnosis Date   A-fib ([place])    Aortic insufficiency    Hypertension    Stroke [facility])    Past Surgical History:  Procedure Laterality Date   ABDOMINAL HYSTERECTOMY     ABLATION  05/03/2021   heart   c section  01/1968   KNEE SURGERY Right    meniscus   SHOULDER SURGERY Right 08/2014   Patient Active Problem List   Diagnosis Date Noted   ICH (intracerebral hemorrhage) ([place]) 10/01/2021   Paroxysmal atrial fibrillation ([place]) 09/29/2021   Stenosis of intracranial vessel 09/29/2021   Essential hypertension 09/29/2021   Blurry vision 09/29/2021   GERD (gastroesophageal reflux disease) 09/29/2021   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 Pt reports she got herself dressed and ready for the day. Pt with pictures of her home setup on her cell phone, wanting to discuss placement of equipment next to her bed. Pt reports she has a cortisone injection for her R knee scheduled on 11/3.  Pt accompanied by: self  PERTINENT HISTORY: 66 year old female here for evaluation of right thalamic intracerebral hemorrhage. Patient is was admitted to the hospital in June 2023 for new onset left-sided weakness headache and nausea. She was found to have intracerebral hemorrhage in the right thalamus. Patient was on Eliquis anticoagulation for atrial fibrillation at the time and this was reversed medically. She was started on hypertonic saline, blood pressure control in ICU admission. Patient went to inpatient rehabilitation. Now patient back home. Still has left-sided weakness and left hand and arm and leg pain. Has been to cardiology who recommended to hold anticoagulation for now as patient is not in atrial fibrillation.   Past medical history of atrial fibrillation on Eliquis aortic insufficiency, hypertension, fibromyalgia  PAIN:  Are you having pain? Yes: NPRS scale: 7/10 Pain location: midline to Lt lateral area of buttock Pain description: constant, dull, aching Aggravating factors: swelling aggravates it Relieving factors: ice , Voltaren gel   PRECAUTIONS: Fall  WEIGHT BEARING RESTRICTIONS No  FALLS: Has patient fallen in last 6 months? No  LIVING ENVIRONMENT: Lives with: daughter, Charlotte Hunter Lives in: House/apartment Stairs: No - Ramp has been built at front entrance Has following equipment at home: Programmer, multimedia, Environmental consultant - 2 wheeled, and bed side commode  PLOF: Independent; pt did gardening prior to CVA - was not employed prior to onset of CVA  PATIENT GOALS "be restored back to where I  was"  OBJECTIVE:  SELF CARE:   Pt provided photos of her current bedroom setup and with questions re placement of her w/c and her BSC. Recommended that pt move her bedside table from R side of her bed to L side to allow placement of her w/c next to the [facility] and for her BSC to remain at the foot of her bed. Primary PT in agreement with placement of w/c and BSC.  THER EX:  NuStep level 1 x 5 min with use of BLE only for strengthening, pt unable to tolerate increased resistance this date. Pt has onset of R knee pain during exercise so deferred further time on exercise machine.  THER ACT: Assessed STG #3, pt able to stand x 5 min with no UE support and S*. Pt also able to stand and reach 6.5" with close SBA.  GAIT: Pt wearing her custom AFO on her LLE:  GAIT: Gait pattern: decreased step length- Left, decreased stance time- Left, decreased stride length, decreased hip/knee flexion- Left, decreased ankle dorsiflexion- Left, genu recurvatum- Left, and narrow BOS Distance walked: 50 ft Assistive device utilized: Walker - 2 wheeled Level of assistance: CGA Comments: gait distance limited this date by pt reports of R knee pain and feeling like her R knee may give out    TherEx:  Access Code: [place] - issued on 01-17-22 URL: [email or web address] Date: 01/17/2022 Prepared by: Charlotte Hunter  Exercises - Seated Soleus Stretch  - 2-3 x daily - 7 x weekly - 1 sets - 2 reps - 15-20 secs  hold - Slant Board Gastrocnemius Stretch  - 3 x daily - 7 x weekly - 1 sets - 2 reps - 15-20 secs hold - Seated Gastroc Stretch with Strap  - 2-3 x daily - 7 x weekly - 1 sets - 2 reps - 15-20 hold   PATIENT EDUCATION:  01-17-22 Education details:  Discussed/reviewed STG's with pt, emphasized importance of amb. In home with assistance, discussed bedroom setup Person educated: Patient Education method: Explanation, Demonstration Education comprehension: verbalized understanding,  demonstration   HOME EXERCISE PROGRAM:  Previously issued Access Code: [email or web address] URL: [email or web address] Date: 01/08/2022 Prepared by: Charlotte Hunter  Exercises - Bridge  - 1 x daily - 7 x weekly - 1 sets - 10 reps - 5 hold - Single Leg Bridge  - 1 x daily - 7 x weekly - 1 sets - 10 reps - 3 sec hold - Hooklying Clamshell with Resistance  - 1 x daily - 7 x weekly - 1 sets - 10 reps - 2-3 sec hold - Supine Hip Flexion with Resistance Loop  - 1 x daily - 7 x weekly - 3 sets - 10 reps - Supine Heel Slide  - 1 x daily - 7 x weekly - 3 sets - 10 reps - Stepping out/in with LEFT leg  - 1 x daily - 7 x weekly - 3 sets - 10 reps - Hooklying Hamstring Stretch with Strap  - 1 x daily - 7 x weekly - 1 sets - 1-2 reps - 20 sec hold  GOALS: Goals reviewed with patient? Yes  SHORT TERM GOALS: Target date: 01/31/2022  Pt will perform basic transfers wheelchair to mat with supervision toward Rt side. Baseline: CGA Goal status: Progressing  - 01-28-22: pt requires CGA to SBA  2.  Amb. 115' with RW with CGA with AFO on LLE. Baseline: 46' with RW Goal status: Goal met 01-24-22  3.  Pt will stand for at least 5" with UE support prn with SBA:  pt will be able to reach 6" anteriorly without LOB with SBA for increased safety & independence with ADL's. Baseline:  Goal status: MET  4.  Perform bed mobility sit to/from supine with SBA.  Baseline: TBA Goal status: Partially met 01-28-22: pt uses rail on bed on Rt side to assist with supine to sit transfer; needs min assist to transfer supine to sit toward Lt side  5.  Assess Berg balance test and establish LTG as appropriate. Baseline:  score 16/56 (tested on 01-18-22) Goal status: Goal met   6.  Independent in HEP for LLE strengthening. Baseline:  Goal status: Goal met 01-24-22   LONG TERM GOALS: Target date: 03/28/2022  Modified independent household amb. with appropriate assistive device (RW or LBQC).  Baseline:  Goal status:  INITIAL  2.  Pt will increase Berg balance test score by at least 12 points to demo improved balance and to reduce fall risk.  Baseline: to be assessed Goal status: INITIAL  3.  Amb. 350' with RW with SBA for increased community accessibility.  Baseline:  Goal status: INITIAL  4.  Negotiate 4 steps with use of handrail with SBA using step by step sequence. Baseline: unable to perform at eval Goal status: INITIAL  5.  Pt will improve TUG score by at least 15 secs to demo improved functional mobility and reduced fall risk. Baseline:  Goal status: INITIAL  6.  Pt will negotiate ramp and curb with RW with SBA for increased community accessibility.  Baseline:  Goal status: INITIAL  ASSESSMENT:  CLINICAL IMPRESSION: Emphasis of skilled PT session on assessing STG #3, working on LE strengthening, working on short distance gait with RW, and discussing pt's home setup. Pt has met STG #3 as well as overall meeting 4/6 STG and partially meeting or making progress towards all 6/6 STG. Pt continues to benefit from skilled therapy services to address ongoing gait impairments, decreased balance, decreased endurance, decreased L hemibody strength, and decreased safety and independence with functional mobility. Encouraged pt to keep working on walking  at home as short distance gait in therapy is not enough repetition of task to make functional gains. Continue POC.   OBJECTIVE IMPAIRMENTS Abnormal gait, decreased activity tolerance, decreased balance, decreased coordination, decreased endurance, decreased knowledge of use of DME, decreased ROM, decreased strength, impaired sensation, impaired tone, and impaired UE functional use.   ACTIVITY LIMITATIONS carrying, lifting, bending, standing, squatting, stairs, transfers, bed mobility, and locomotion level  PARTICIPATION LIMITATIONS: meal prep, cleaning, laundry, driving, shopping, community activity, and yard work  [place] and severity  of deficits  are also affecting patient's functional outcome.   REHAB POTENTIAL: Good  CLINICAL DECISION MAKING: Evolving/moderate complexity  EVALUATION COMPLEXITY: Moderate  PLAN: PT FREQUENCY: 3x/week for initial 4 weeks, followed by 2x/week for 8 weeks  PT DURATION: 12 weeks  PLANNED INTERVENTIONS: Therapeutic exercises, Therapeutic activity, Neuromuscular re-education, Balance training, Gait training, Patient/Family education, Self Care, Stair training, Orthotic/Fit training, DME instructions, Aquatic Therapy, and Wheelchair mobility training  PLAN FOR NEXT SESSION:  cont gait training with RW;  LLE strengthening, L NMR    Excell Seltzer, PT Excell Seltzer, PT, DPT, CSRS  01/29/2022, 11:47 AM

## 2022-01-31 ENCOUNTER — Ambulatory Visit: Payer: Medicare Other | Admitting: Physical Therapy

## 2022-01-31 DIAGNOSIS — I69154 Hemiplegia and 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 left non-dominant side: Secondary | ICD-10-CM

## 2022-01-31 DIAGNOSIS — R2681 Unsteadiness on feet: Secondary | ICD-10-CM

## 2022-01-31 DIAGNOSIS — M6281 Muscle weakness (generalized): Secondary | ICD-10-CM

## 2022-01-31 DIAGNOSIS — R2689 Other abnormalities of gait and mobility: Secondary | ICD-10-CM

## 2022-01-31 NOTE — Therapy (Signed)
OUTPATIENT PHYSICAL THERAPY NEURO TREATMENT NOTE       Patient Name: Charlotte Hunter MRN: [id number] DOB:1956/02/11, 66 y.o., female Today's Date: 02/01/2022   PCP: Su Grand, MD REFERRING PROVIDER: Bayard Hugger, NP    PT End of Session - 02/01/22 1033     Visit Number 12    Number of Visits 29    Date for PT Re-Evaluation 03/29/22    Authorization Type Medicare    Authorization Time Period 01-03-22 - 04-03-22    Progress Note Due on Visit 10    PT Start Time 1016    PT Stop Time 1102    PT Time Calculation (min) 46 min    Equipment Utilized During Treatment Gait belt   pt wearing her AFO   Activity Tolerance Patient tolerated treatment well    Behavior During Therapy WFL for tasks assessed/performed                   Past Medical History:  Diagnosis Date   A-fib ([place])    Aortic insufficiency    Hypertension    Stroke [facility])    Past Surgical History:  Procedure Laterality Date   ABDOMINAL HYSTERECTOMY     ABLATION  05/03/2021   heart   c section  01/1968   KNEE SURGERY Right    meniscus   SHOULDER SURGERY Right 08/2014   Patient Active Problem List   Diagnosis Date Noted   ICH (intracerebral hemorrhage) ([place]) 10/01/2021   Paroxysmal atrial fibrillation ([place]) 09/29/2021   Stenosis of intracranial vessel 09/29/2021   Essential hypertension 09/29/2021   Blurry vision 09/29/2021   GERD (gastroesophageal reflux disease) 09/29/2021   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 09/25/2021    ONSET DATE: 09-25-21  REFERRING DIAG: I61.9 (ICD-10-CM) - Hemorrhagic stroke ([place])   THERAPY DIAG:  Muscle weakness (generalized)  Other abnormalities of gait and mobility  Unsteadiness on feet  Hemiplegia and 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 left non-dominant side ([place])  Rationale for Evaluation and Treatment Rehabilitation  SUBJECTIVE:                                                                                                                                                                                               SUBJECTIVE STATEMENT: Pt reports she hasn't walked any since Tuesday - states her custom wheelchair was delivered yesterday (from [employer]) - is able to get around in house much better; states her sister-in-law helped her set up things in her room to be more independent; says they (brother and sister-in-law) left to go back home this morning  Pt accompanied by: self and friend Santiago Glad  PERTINENT HISTORY: 66 year old female here for evaluation of right thalamic intracerebral hemorrhage. Patient is was admitted to the hospital in June 2023 for new onset left-sided weakness headache and nausea. She was found to have intracerebral hemorrhage in the right thalamus. Patient was on Eliquis anticoagulation for atrial fibrillation at the time and this was reversed medically. She was started on hypertonic saline, blood pressure control in ICU admission. Patient went to inpatient rehabilitation. Now patient back home. Still has left-sided weakness and left hand and arm and leg pain. Has been to cardiology who recommended to hold anticoagulation for now as patient is not in atrial fibrillation.   Past medical history of atrial fibrillation on Eliquis aortic insufficiency, hypertension, fibromyalgia  PAIN:  Are you having pain? Yes: NPRS scale: 5-6/10 Pain location: midline to Lt lateral area of buttock Pain description: constant, dull, aching Aggravating factors: swelling aggravates it Relieving factors: ice , Voltaren gel   01-31-22:Pt reporting increased pain in Lt groin, lower abdomen and inner thigh area - appears to be due to excessive reps and ROM with hip abduction in hooklying with use of theraband after further questioning  PRECAUTIONS: Fall  WEIGHT BEARING RESTRICTIONS No  FALLS: Has patient fallen in last 6 months? No  LIVING ENVIRONMENT: Lives with: daughter,  [name] Lives in: House/apartment Stairs: No - Ramp has been built at front entrance Has following equipment at home: Programmer, multimedia, Environmental consultant - 2 wheeled, and bed side commode  PLOF: Independent; pt did gardening prior to CVA - was not employed prior to onset of CVA  PATIENT GOALS "be restored back to where I was"  OBJECTIVE:  Pt transferred wheelchair to/from mat toward Rt side using stand pivot transfer with CGA to SBA  Pt performed sit to stand transfer from mat table with RUE support 1 rep to RW with CGA (after performing mat transfers);  attempted gait training but pt reported feeling "woozy" and requested not to attempt walking in today's session   THER EX:  Mat exercises for LLE strengthening: Bridging bil. LE's 10 reps Lt hip flexion in hooklying position 10 reps (Lt knee flexed) Lt hip abduction in hooklying without resistance 10 reps Lt SLR 10 reps Lt hip abduction - clam shell exercise in Rt sidelying position- 10 reps - no resistance  NuStep level 3 x 5 min with use of BLE only for strengthening;  Pt has onset of R knee pa  NeuroRe-ed:  Pt transferred sit to stand from mat to RW with CGA Performed tap ups to 4" step with RLE 10 sets 2 sets with RUE support on RW with CGA to min assist Standing unsupported without UE support on RW - pt performed horizontal head turns 5 reps - 2 sets with min assist due to fear/nervousness with instability with head movement   TherEx:  Access Code: [email or web address] - issued on 01-17-22 URL: [email or web address] Date: 01/17/2022 Prepared by: Ethelene Browns  Exercises - Seated Soleus Stretch  - 2-3 x daily - 7 x weekly - 1 sets - 2 reps - 15-20 secs  hold - Slant Board Gastrocnemius Stretch  - 3 x daily - 7 x weekly - 1 sets - 2 reps - 15-20 secs hold - Seated Gastroc Stretch with Strap  - 2-3 x daily - 7 x weekly - 1 sets - 2 reps - 15-20 hold   PATIENT EDUCATION:  01-17-22 Education details:  Discussed/reviewed STG's with  pt, emphasized importance of amb.  In home with assistance, discussed bedroom setup Person educated: Patient Education method: Explanation, Demonstration Education comprehension: verbalized understanding, demonstration   HOME EXERCISE PROGRAM:  Previously issued Access Code: [email or web address] URL: [email or web address] Date: 01/08/2022 Prepared by: Ethelene Browns  Exercises - Bridge  - 1 x daily - 7 x weekly - 1 sets - 10 reps - 5 hold - Single Leg Bridge  - 1 x daily - 7 x weekly - 1 sets - 10 reps - 3 sec hold - Hooklying Clamshell with Resistance  - 1 x daily - 7 x weekly - 1 sets - 10 reps - 2-3 sec hold - Supine Hip Flexion with Resistance Loop  - 1 x daily - 7 x weekly - 3 sets - 10 reps - Supine Heel Slide  - 1 x daily - 7 x weekly - 3 sets - 10 reps - Stepping out/in with LEFT leg  - 1 x daily - 7 x weekly - 3 sets - 10 reps - Hooklying Hamstring Stretch with Strap  - 1 x daily - 7 x weekly - 1 sets - 1-2 reps - 20 sec hold  GOALS: Goals reviewed with patient? Yes  SHORT TERM GOALS: Target date: 01/31/2022  Pt will perform basic transfers wheelchair to mat with supervision toward Rt side. Baseline: CGA Goal status: Progressing  - 01-28-22: pt requires CGA to SBA  2.  Amb. 115' with RW with CGA with AFO on LLE. Baseline: 14' with RW Goal status: Goal met 01-24-22  3.  Pt will stand for at least 5" with UE support prn with SBA:  pt will be able to reach 6" anteriorly without LOB with SBA for increased safety & independence with ADL's. Baseline:  Goal status: Goal met 01-29-22  4.  Perform bed mobility sit to/from supine with SBA.  Baseline: TBA Goal status: Partially met 01-28-22: pt uses rail on bed on Rt side to assist with supine to sit transfer; needs min assist to transfer supine to sit toward Lt side  5.  Assess Berg balance test and establish LTG as appropriate. Baseline:  score 16/56 (tested on 01-18-22) Goal status: Goal met   6.  Independent in HEP for  LLE strengthening. Baseline:  Goal status: Goal met 01-24-22   LONG TERM GOALS: Target date: 03/28/2022  Modified independent household amb. with appropriate assistive device (RW or LBQC).  Baseline:  Goal status: INITIAL  2.  Pt will increase Berg balance test score by at least 12 points to demo improved balance and to reduce fall risk.  Baseline: to be assessed Goal status: INITIAL  3.  Amb. 350' with RW with SBA for increased community accessibility.  Baseline:  Goal status: INITIAL  4.  Negotiate 4 steps with use of handrail with SBA using step by step sequence. Baseline: unable to perform at eval Goal status: INITIAL  5.  Pt will improve TUG score by at least 15 secs to demo improved functional mobility and reduced fall risk. Baseline:  Goal status: INITIAL  6.  Pt will negotiate ramp and curb with RW with SBA for increased community accessibility.  Baseline:  Goal status: INITIAL  ASSESSMENT:  CLINICAL IMPRESSION:  PT session focused on LLE strengthening and standing balance with focus on LLE SLS and also worked on unsupported standing with head turns for first time.  Pt reported feeling fatigued and "woozy" after performing mat exercises for LLE strengthening in first half of session - declined gait training in today's session due to not  feeling "up to it".  Pt continues to be instructed in need/benefit of ambulating daily at home with assistance as able.  Pt verbalizes understanding.  Continue POC.   OBJECTIVE IMPAIRMENTS Abnormal gait, decreased activity tolerance, decreased balance, decreased coordination, decreased endurance, decreased knowledge of use of DME, decreased ROM, decreased strength, impaired sensation, impaired tone, and impaired UE functional use.   ACTIVITY LIMITATIONS carrying, lifting, bending, standing, squatting, stairs, transfers, bed mobility, and locomotion level  PARTICIPATION LIMITATIONS: meal prep, cleaning, laundry, driving, shopping,  community activity, and yard work  [place] and severity of deficits  are also affecting patient's functional outcome.   REHAB POTENTIAL: Good  CLINICAL DECISION MAKING: Evolving/moderate complexity  EVALUATION COMPLEXITY: Moderate  PLAN: PT FREQUENCY: 3x/week for initial 4 weeks, followed by 2x/week for 8 weeks  PT DURATION: 12 weeks  PLANNED INTERVENTIONS: Therapeutic exercises, Therapeutic activity, Neuromuscular re-education, Balance training, Gait training, Patient/Family education, Self Care, Stair training, Orthotic/Fit training, DME instructions, Aquatic Therapy, and Wheelchair mobility training  PLAN FOR NEXT SESSION:  cont gait training with RW;  LLE strengthening, L NMR    Alda Lea, PT   02/01/2022, 10:35 AM

## 2022-02-01 ENCOUNTER — Encounter: Payer: Self-pay | Admitting: Physical Therapy

## 2022-02-03 ENCOUNTER — Other Ambulatory Visit: Payer: Self-pay | Admitting: Registered Nurse

## 2022-02-05 NOTE — Therapy (Signed)
OUTPATIENT OCCUPATIONAL THERAPY NEURO EVALUATION  Patient Name: Coty Bauknecht MRN: [id number] DOB:[birth year], 66 y.o., female Today's Date: 02/06/2022  PCP: Su Grand, MD REFERRING PROVIDER: Bayard Hugger, NP    OT End of Session - 02/06/22 1217     Visit Number 1    Number of Visits 25    Date for OT Re-Evaluation 05/07/22    Authorization Type MCR, BC/BS    Authorization - Visit Number 1    Authorization - Number of Visits 10    Progress Note Due on Visit 10    OT Start Time 0850    OT Stop Time 0930    OT Time Calculation (min) 40 min    Activity Tolerance Patient tolerated treatment well    Behavior During Therapy [facility] for tasks assessed/performed             Past Medical History:  Diagnosis Date   A-fib [facility])    Aortic insufficiency    Hypertension    Stroke 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 09/25/2021    ONSET DATE: 12/14/2021 Referral date  REFERRING DIAG: I61.9 (ICD-10-CM) - Hemorrhagic stroke ([place])   THERAPY DIAG:  Hemiplegia and 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 left non-dominant side ([name])  Other lack of coordination  Unsteadiness on feet  Muscle weakness (generalized)  Other symptoms and signs involving cognitive functions following cerebral infarction  Other disturbances of skin sensation  Rationale for Evaluation and Treatment  Rehabilitation  SUBJECTIVE:   SUBJECTIVE STATEMENT: My Lt hand can't really help with things Pt accompanied by: self (daughter came near end of evaluation)   PERTINENT HISTORY: 65 year old female here for evaluation of right thalamic intracerebral hemorrhage. Patient was admitted to the hospital in September 25, 2021 for new onset left-sided weakness headache and nausea. She was found to have intracerebral hemorrhage in the right thalamus. Patient was on Eliquis anticoagulation for atrial fibrillation at the time and this was reversed medically. She was started on hypertonic saline, blood pressure control in ICU admission. Patient went to inpatient rehabilitation. Then pt had HH therapies. Still has left-sided weakness and left hand and arm and leg pain. Has been to cardiology who recommended to hold anticoagulation for now as patient is not in atrial fibrillation.    Past medical history of atrial fibrillation on Eliquis aortic insufficiency, HTN, fibromyalgia  PRECAUTIONS: Fall and Other: NO lifting > 14 lbs, no driving  WEIGHT BEARING RESTRICTIONS: No  PAIN:  Are you having pain? Yes: NPRS scale: 7/10 Pain location: Lt hand Pain description: burning, constant  Aggravating factors: cold weather Relieving factors: meds, voltaren gel  FALLS: Has patient fallen in last 6 months? No  LIVING ENVIRONMENT: Lives with: daughter, [name] Lives in: House/apartment Stairs: No - Ramp has been built at front entrance Has following equipment at home: w/c, quad cane large base, Walker - 2 wheeled, and bed side commode  PLOF: Independent; pt did gardening  prior to CVA - was not employed prior to onset of CVA   PATIENT GOALS: get my hand better, return to gardening  OBJECTIVE:   HAND DOMINANCE: Right  ADLs: Eating: dependent for cutting food, mod I eating Grooming: mod I  UB Dressing: min assist w/ sports bra and getting shirt overhead LB Dressing: min assist w/ Lt shoe (d/t brace) and tying  shoes Toileting: uses BSC - min assist for clothes management Bathing: seated - w/ min to assist for back, bottom, and feet Tub Shower transfers: grab bar, ? Tub transfer bench for shower (w/ high thresh hold) - close sup to CGA  IADLs: Shopping: daughter performing now Light housekeeping: daughter performing now Meal Prep: pt can heat up things in microwave, make sandwich, however daughter cooking Community mobility: relies on daughter or friend for transportation Medication management: does w/ pillbox and strategies Financial management: mod I  Handwriting:  denies change (difficulty stabilizing check w/ Lt hand)   MOBILITY STATUS:  primarily uses w/c, sup using walker shorter distances   UPPER EXTREMITY ROM:  RUE AROM WNL's.  LUE: Pt has approx 60* sh flexion w/ no control and compensations into abd, IR and forearm pronation. Pt has approx 75% ER, 50% IR. Full elbow flex, 75% elbow ext and supination, wrist WFL's, 90% finger flexion    HAND FUNCTION: Grip strength: Right: 68.1 lbs; Left: 14.5 lbs  COORDINATION: [place] Lt = 7,  9 hole peg unable LUE Pt able to pick up pen Lt hand but unable to manipulate  SENSATION: Light touch: absent except slight detection more proximal hand - unable to localize. Absent in fingertips Proprioception: Impaired   EDEMA: very minimal Lt hand  MUSCLE TONE: LUE: dystonia   COGNITION: Overall cognitive status:  inconsistent w/ comprehension of questions during eval, short term memory deficits  VISION: Subjective report: diplopia resolved Baseline vision: Bifocals Visual history:  none  VISION ASSESSMENT: Not tested  Patient has difficulty with following activities due to following visual impairments: Pt reports some blurriness w/ reading  PERCEPTION: Not tested  PRAXIS: Not tested    TODAY'S TREATMENT:                                                                         DATE:  N/A  PATIENT EDUCATION: Education  details: OT POC Person educated: Patient and Child(ren) Education method: Explanation Education comprehension: verbalized understanding  HOME EXERCISE PROGRAM: N/A today - evaluation only   GOALS: Goals reviewed with patient? Yes  SHORT TERM GOALS: Target date: 03/09/22  Independent with initial HEP  Baseline: Goal status: INITIAL  2.  Pt to cut food w/ A/E prn and report greater ease opening peel containers (yogurts, deli meat) Baseline:  Goal status: INITIAL  3.  Pt to improve LUE functional use by demo improvement on Box & Blocks to 13 or more Baseline: 7 blocks Goal status: INITIAL  4.  Pt to perform UB dressing mod I level Baseline:  Goal status: INITIAL  5.  Pt to perform low level functional reaching w/ min compensations to grasp/release 1" objects Baseline:  Goal status: INITIAL  6.  Pt to consistently perform clothes management w/ min assist from LUE w/o LOB Baseline:  Goal status: INITIAL  7.  Pt to verbalize understanding of safety strategies d/t lack of sensation Lt hand Baseline:  Goal status: INITIAL   LONG TERM GOALS: Target date: 05/07/22  Independent w/ updated HEP  Baseline:  Goal status: INITIAL  2.  Pt to improve LUE function as evidenced by performing Box & Blocks to 18 or more Baseline: 7 blocks Goal status: INITIAL  3.  Pt to improve [place] hand as evidenced by performing 9 hole peg test in under 2 min Baseline: unable Goal status: INITIAL  4.  Pt to be mod I for all ADLS (except sup for shower transfers)  Baseline:  Goal status: INITIAL  5.  Pt to perform 70* sh flexion for functional light reaching with min compensations LUE Baseline:  Goal status: INITIAL  6.  Grip strength Lt hand to be 20 lbs or greater Baseline: 14.5 lbs Goal status: INITIAL  7.  Pt will be mod I for gardening task w/ DME/AE prn Baseline:  Goal status: INITIAL   ASSESSMENT:  CLINICAL IMPRESSION: Patient is a 66 y.o. female who was seen today for  occupational therapy evaluation s/p Rt thalamic ICH on 09/25/21 w/ residual Lt non dominant side hemiplegia. Pt presents today w/ deficits in sensation, hemiplegia, dystonia, decreased UE functional use, coordination, strength, and balance and would benefit from skilled O.T. to address above deficits and increase independence and safety with ADLS and decrease caregiver burden of care.   PERFORMANCE DEFICITS: in functional skills including ADLs, IADLs, coordination, dexterity, proprioception, sensation, tone, ROM, strength, pain, Fine motor control, Gross motor control, mobility, balance, endurance, decreased knowledge of use of DME, and UE functional use, cognitive skills including memory, problem solving, and safety awareness.   IMPAIRMENTS: are limiting patient from ADLs, IADLs, leisure, and social participation.   CO-MORBIDITIES; may have co-morbidities  that affects occupational performance. Patient will benefit from skilled OT to address above impairments and improve overall function.  MODIFICATION OR ASSISTANCE TO COMPLETE EVALUATION: Min-Moderate modification of tasks or assist with assess necessary to complete an evaluation.  OT OCCUPATIONAL PROFILE AND HISTORY: Problem focused assessment: Including review of records relating to presenting problem.  CLINICAL DECISION MAKING: Moderate - several treatment options, min-mod task modification necessary  REHAB POTENTIAL: Good  EVALUATION COMPLEXITY: Low    PLAN:  OT FREQUENCY: 2x/week  OT DURATION: 12 weeks  PLANNED INTERVENTIONS: self care/ADL training, therapeutic exercise, therapeutic activity, neuromuscular re-education, passive range of motion, functional mobility training, aquatic therapy, splinting, fluidotherapy, moist heat, patient/family education, cognitive remediation/compensation, visual/perceptual remediation/compensation, coping strategies training, and DME and/or AE instructions  RECOMMENDED OTHER SERVICES: will monitor  for speech eval needs if cognition deficits impeding ADLS  CONSULTED AND AGREED WITH PLAN OF CARE: Patient and family member/caregiver  PLAN FOR NEXT SESSION: HEP for LUE and simple functional use/coordination, safety for sensory deficits   Hans Eden, OT 02/06/2022, 12:18 PM

## 2022-02-06 ENCOUNTER — Ambulatory Visit: Payer: Medicare Other | Admitting: Occupational Therapy

## 2022-02-06 ENCOUNTER — Encounter: Payer: Self-pay | Admitting: Occupational Therapy

## 2022-02-06 DIAGNOSIS — I69318 Other symptoms and signs involving cognitive functions following cerebral infarction: Secondary | ICD-10-CM

## 2022-02-06 DIAGNOSIS — R2689 Other abnormalities of gait and mobility: Secondary | ICD-10-CM | POA: Diagnosis not present

## 2022-02-06 DIAGNOSIS — R278 Other lack of coordination: Secondary | ICD-10-CM

## 2022-02-06 DIAGNOSIS — I69154 Hemiplegia and 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 left non-dominant side: Secondary | ICD-10-CM

## 2022-02-06 DIAGNOSIS — M6281 Muscle weakness (generalized): Secondary | ICD-10-CM

## 2022-02-06 DIAGNOSIS — R208 Other disturbances of skin sensation: Secondary | ICD-10-CM

## 2022-02-06 DIAGNOSIS — R2681 Unsteadiness on feet: Secondary | ICD-10-CM

## 2022-02-11 ENCOUNTER — Ambulatory Visit: Payer: Medicare Other | Admitting: Physical Therapy

## 2022-02-11 ENCOUNTER — Encounter: Payer: Self-pay | Admitting: Physical Therapy

## 2022-02-11 DIAGNOSIS — M6281 Muscle weakness (generalized): Secondary | ICD-10-CM

## 2022-02-11 DIAGNOSIS — R2689 Other abnormalities of gait and mobility: Secondary | ICD-10-CM

## 2022-02-11 DIAGNOSIS — I69154 Hemiplegia and 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 left non-dominant side: Secondary | ICD-10-CM

## 2022-02-11 NOTE — Therapy (Signed)
OUTPATIENT PHYSICAL THERAPY NEURO TREATMENT NOTE       Patient Name: Charlotte Hunter MRN: [id number] DOB:[birth year], 66 y.o., female Today's Date: 02/11/2022   PCP: Charlotte Grand, MD REFERRING PROVIDER: Bayard Hugger, NP    PT End of Session - 02/11/22 1035     Visit Number 13    Number of Visits 29    Date for PT Re-Evaluation 03/29/22    Authorization Type Medicare    Authorization Time Period 01-03-22 - 04-03-22    Progress Note Due on Visit 10    PT Start Time 0935    PT Stop Time 1021    PT Time Calculation (min) 46 min    Equipment Utilized During Treatment Gait belt   pt wearing her AFO:  clinic's RW used   Activity Tolerance Patient tolerated treatment well    Behavior During Therapy WFL for tasks assessed/performed                    Past Medical History:  Diagnosis Date   A-fib [facility])    Aortic insufficiency    Hypertension    Stroke [facility])    Past Surgical History:  Procedure Laterality Date   ABDOMINAL HYSTERECTOMY     ABLATION  05/03/2021   heart   c section  01/1968   KNEE SURGERY Right    meniscus   SHOULDER SURGERY Right 08/2014   Patient Active Problem List   Diagnosis Date Noted   ICH (intracerebral hemorrhage) ([place]) 10/01/2021   Paroxysmal atrial fibrillation ([place]) 09/29/2021   Stenosis of intracranial vessel 09/29/2021   Essential hypertension 09/29/2021   Blurry vision 09/29/2021   GERD (gastroesophageal reflux disease) 09/29/2021   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 09/25/2021    ONSET DATE: 09-25-21  REFERRING DIAG: I61.9 (ICD-10-CM) - Hemorrhagic stroke ([place])   THERAPY DIAG:  Hemiplegia and 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 left non-dominant side ([name])  Muscle weakness (generalized)  Other abnormalities of gait and mobility  Rationale for Evaluation and Treatment Rehabilitation  SUBJECTIVE:                                                                                                                                                                                               SUBJECTIVE STATEMENT: Pt reports she is walking some at home but is not walking every day - asks about walking outside on her deck; states she is afraid the walker wheels will go down into the space between the deck boards - is going to take a picture of it and show it to me.  Daughter  states pt is still not walking in her bedroom due to the shag carpet - is afraid her shoes will get caught on the carpet and make her trip and fall.  I suggested she try 4-5 steps with RW and increase distance/steps gradually to increase comfort and security with amb. On this surface (I told her I feel that she should be able to do this)  Pt accompanied by: self and daughter Charlotte Hunter  PERTINENT HISTORY: 66 year old female here for evaluation of right thalamic intracerebral hemorrhage. Patient is was admitted to the hospital in June 2023 for new onset left-sided weakness headache and nausea. She was found to have intracerebral hemorrhage in the right thalamus. Patient was on Eliquis anticoagulation for atrial fibrillation at the time and this was reversed medically. She was started on hypertonic saline, blood pressure control in ICU admission. Patient went to inpatient rehabilitation. Now patient back home. Still has left-sided weakness and left hand and arm and leg pain. Has been to cardiology who recommended to hold anticoagulation for now as patient is not in atrial fibrillation.   Past medical history of atrial fibrillation on Eliquis aortic insufficiency, hypertension, fibromyalgia  PAIN:  Are you having pain? Yes: NPRS scale: 5-6/10 Pain location: Rt knee - is scheduled to have cortisone injection on Friday this week  Pain description: constant, dull, aching Aggravating factors: swelling aggravates it Relieving factors: ice , Voltaren gel     PRECAUTIONS: Fall  WEIGHT BEARING  RESTRICTIONS No  FALLS: Has patient fallen in last 6 months? No  LIVING ENVIRONMENT: Lives with: daughter, Charlotte Hunter Lives in: House/apartment Stairs: No - Ramp has been built at front entrance Has following equipment at home: Programmer, multimedia, Environmental consultant - 2 wheeled, and bed side commode  PLOF: Independent; pt did gardening prior to CVA - was not employed prior to onset of CVA  PATIENT GOALS "be restored back to where I was"  OBJECTIVE:  Pt transferred wheelchair to/from mat toward Rt side using stand pivot transfer with SBA; pt's Lt foot hit caster on wheelchair so CGA was given for stability and security  Pt performed sit to stand transfer from mat table to RW with SBA    GAIT: Gait pattern: decreased step length- Left, decreased stance time- Left, decreased stride length, decreased hip/knee flexion- Left, decreased ankle dorsiflexion- Left, and decreased trunk rotation; some mild Lt genu recurvatum occasionally in stance Distance walked:  115' x 1 rep  Assistive device utilized: Environmental consultant - 2 wheeled Level of assistance: CGA;  cues to increase weight shift onto LLE in stance phase of gait Comments: pt wearing custom AFO on LLE;  pt was wearing Lidocaine patch prior to start of session     Pt then amb. 25' from high low mat table to // bars; pt gait trained inside // bars with RUE only 10' forward/10'  backward with RUE with CGA to min assist initially, progressing to [place] only as pt increased comfort with gait training;  pt gait trained 10' x 4 reps with turning around for final 2 laps with CGA  Pt reported Rt knee pain after this gait training and requested a seated rest period - requested to stop weight bearing activities/exercise due to Rt knee pain, therefore, Scifit was then performed for strengthening   THER EX:   SciFit level 2 x 5 min with use of BLE and RUE for initial 3.5"; pt's Lt hand was placed on handle for final 1.5" and pt tolerated AROM for LUE well  TherEx:   Access Code: [email or web address] - issued on 01-17-22 URL: [name] Date: 01/17/2022 Prepared by: Charlotte Hunter  Exercises - Seated Soleus Stretch  - 2-3 x daily - 7 x weekly - 1 sets - 2 reps - 15-20 secs  hold - Slant Board Gastrocnemius Stretch  - 3 x daily - 7 x weekly - 1 sets - 2 reps - 15-20 secs hold - Seated Gastroc Stretch with Strap  - 2-3 x daily - 7 x weekly - 1 sets - 2 reps - 15-20 hold   PATIENT EDUCATION:  01-17-22 Education details:  Discussed pt amb. In bedroom - starting with few steps on the shag carpet and gradually increasing distance as able; also discussed possibility of pt amb. On deck with RW and/or possibly holding onto deck rail with RUE  and amb.  This way - pt states she currently has too many plants on deck in order to be able to do this - daughter states plants can be moved -- pt is to take a picture and show for better visualization Person educated: Patient, Daughter Education method: Explanation, Demonstration Education comprehension: verbalized understanding, demonstration   HOME EXERCISE PROGRAM:  Previously issued Access Code: [email or web address] URL: [email or web address] Date: 01/08/2022 Prepared by: Charlotte Hunter  Exercises - Bridge  - 1 x daily - 7 x weekly - 1 sets - 10 reps - 5 hold - Single Leg Bridge  - 1 x daily - 7 x weekly - 1 sets - 10 reps - 3 sec hold - Hooklying Clamshell with Resistance  - 1 x daily - 7 x weekly - 1 sets - 10 reps - 2-3 sec hold - Supine Hip Flexion with Resistance Loop  - 1 x daily - 7 x weekly - 3 sets - 10 reps - Supine Heel Slide  - 1 x daily - 7 x weekly - 3 sets - 10 reps - Stepping out/in with LEFT leg  - 1 x daily - 7 x weekly - 3 sets - 10 reps - Hooklying Hamstring Stretch with Strap  - 1 x daily - 7 x weekly - 1 sets - 1-2 reps - 20 sec hold  GOALS: Goals reviewed with patient? Yes  SHORT TERM GOALS: Target date: 01/31/2022  Pt will perform basic transfers wheelchair to mat with  supervision toward Rt side. Baseline: CGA Goal status: Progressing  - 01-28-22: pt requires CGA to SBA  2.  Amb. 115' with RW with CGA with AFO on LLE. Baseline: 71' with RW Goal status: Goal met 01-24-22  3.  Pt will stand for at least 5" with UE support prn with SBA:  pt will be able to reach 6" anteriorly without LOB with SBA for increased safety & independence with ADL's. Baseline:  Goal status: Goal met 01-29-22  4.  Perform bed mobility sit to/from supine with SBA.  Baseline: TBA Goal status: Partially met 01-28-22: pt uses rail on bed on Rt side to assist with supine to sit transfer; needs min assist to transfer supine to sit toward Lt side  5.  Assess Berg balance test and establish LTG as appropriate. Baseline:  score 16/56 (tested on 01-18-22) Goal status: Goal met   6.  Independent in HEP for LLE strengthening. Baseline:  Goal status: Goal met 01-24-22  NEW SHORT TERM GOALS:  TARGET DATE 02-28-22:   Perform basic transfers with supervision from wheelchair to/from mat toward Rt side. Baseline:  CGA to SBA in clinic Goal status:  NEW  2.  Amb. 230' with RW with SBA on flat, even surface to demo improved endurance.  Baseline:  75' with RW with CGA  Goal status:  NEW  3. Pt will report amb. In her bedroom at home with daughter's assistance with RW.   Baseline:  currently not attempting due to fear of falling  Goal status:  NEW  4.  Improve Berg score to >/= 26/56 to demo improved balance.  Baseline:  16/56 on 01-18-22  Goal status:  NEW  5.  Assess TUG (LTG is set)  Baseline:  TBA  Goal status:  NEW  6.  Amb. 81' with LBQC with min assist on flat, even surface.  Baseline:  not yet assessed due to dependent gait status  Goal status:  NEW    LONG TERM GOALS: Target date: 03/28/2022  Modified independent household amb. with appropriate assistive device (RW or LBQC).  Baseline:  Goal status: INITIAL  2.  Pt will increase Berg balance test score by at least  12 points to demo improved balance and to reduce fall risk.  Baseline: to be assessed Goal status: INITIAL  3.  Amb. 350' with RW with SBA for increased community accessibility.  Baseline:  Goal status: INITIAL  4.  Negotiate 4 steps with use of handrail with SBA using step by step sequence. Baseline: unable to perform at eval Goal status: INITIAL  5.  Pt will improve TUG score by at least 15 secs to demo improved functional mobility and reduced fall risk. Baseline:  Goal status: INITIAL  6.  Pt will negotiate ramp and curb with RW with SBA for increased community accessibility.  Baseline:  Goal status: INITIAL  ASSESSMENT:  CLINICAL IMPRESSION:  PT session focused on gait training with RW initially with pt noted to have increased gait speed; progressed to gait training inside // bars with RUE support only.  Planned to attempt gait training inside // bars without UE support, however, pt reported increased Rt knee pain after having amb. 115' with RW and 80' total combined distance inside // bars.  Pt reports she is scheduled for cortisone injection on Friday, Nov. 3.  Progress is limited by c/o Rt knee and hip pain.  Continue POC.   OBJECTIVE IMPAIRMENTS Abnormal gait, decreased activity tolerance, decreased balance, decreased coordination, decreased endurance, decreased knowledge of use of DME, decreased ROM, decreased strength, impaired sensation, impaired tone, and impaired UE functional use.   ACTIVITY LIMITATIONS carrying, lifting, bending, standing, squatting, stairs, transfers, bed mobility, and locomotion level  PARTICIPATION LIMITATIONS: meal prep, cleaning, laundry, driving, shopping, community activity, and yard work  [place] and severity of deficits  are also affecting patient's functional outcome.   REHAB POTENTIAL: Good  CLINICAL DECISION MAKING: Evolving/moderate complexity  EVALUATION COMPLEXITY: Moderate  PLAN: PT FREQUENCY: 3x/week for  initial 4 weeks, followed by 2x/week for 8 weeks  PT DURATION: 12 weeks  PLANNED INTERVENTIONS: Therapeutic exercises, Therapeutic activity, Neuromuscular re-education, Balance training, Gait training, Patient/Family education, Self Care, Stair training, Orthotic/Fit training, DME instructions, Aquatic Therapy, and Wheelchair mobility training  PLAN FOR NEXT SESSION:  please do TUG with RW for baseline;  gait train with RW, - initiated gait training with RUE support on // bars on 02-11-22 - wanting to try [facility] - if pt agreeable - she may want to wait until after cortisone injection Rt knee   Betta Balla, [place], [place] [address] [place], [place] [address] 02/11/2022, 10:38 AM

## 2022-02-12 ENCOUNTER — Ambulatory Visit: Payer: Medicare Other | Admitting: Occupational Therapy

## 2022-02-12 ENCOUNTER — Encounter: Payer: Self-pay | Admitting: Occupational Therapy

## 2022-02-12 DIAGNOSIS — M6281 Muscle weakness (generalized): Secondary | ICD-10-CM

## 2022-02-12 DIAGNOSIS — I69154 Hemiplegia and 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 left non-dominant side: Secondary | ICD-10-CM

## 2022-02-12 DIAGNOSIS — R278 Other lack of coordination: Secondary | ICD-10-CM

## 2022-02-12 DIAGNOSIS — R2689 Other abnormalities of gait and mobility: Secondary | ICD-10-CM | POA: Diagnosis not present

## 2022-02-12 DIAGNOSIS — R208 Other disturbances of skin sensation: Secondary | ICD-10-CM

## 2022-02-12 NOTE — Therapy (Signed)
OUTPATIENT OCCUPATIONAL THERAPY NEURO TREATMENT  Patient Name: Charlotte Hunter MRN: [id number] DOB:[birth year], 66 y.o., female Today's Date: 02/12/2022  PCP: Charlotte Grand, MD REFERRING PROVIDER: Bayard Hugger, NP    OT End of Session - 02/12/22 1018     Visit Number 2    Number of Visits 25    Date for OT Re-Evaluation 05/07/22    Authorization Type MCR, BC/BS    Authorization - Visit Number 2    Authorization - Number of Visits 10    Progress Note Due on Visit 10    OT Start Time 1015    OT Stop Time 1100    OT Time Calculation (min) 45 min    Activity Tolerance Patient tolerated treatment well    Behavior During Therapy WFL for tasks assessed/performed             Past Medical History:  Diagnosis Date   A-fib ([place])    Aortic insufficiency    Hypertension    Stroke [facility])    Past Surgical History:  Procedure Laterality Date   ABDOMINAL HYSTERECTOMY     ABLATION  05/03/2021   heart   c section  01/1968   KNEE SURGERY Right    meniscus   SHOULDER SURGERY Right 08/2014   Patient Active Problem List   Diagnosis Date Noted   ICH (intracerebral hemorrhage) (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 Patient was admitted to the hospital in September 25, 2021 for new onset left-sided weakness headache and nausea. She was found to have intracerebral hemorrhage in the right thalamus. Patient was on Eliquis anticoagulation for atrial fibrillation at the time and this was reversed medically. She was started on hypertonic saline, blood pressure control in ICU admission. Patient went to inpatient rehabilitation. Then pt had HH therapies. Still has left-sided weakness and left hand and arm and leg pain. Has been to cardiology who recommended to hold anticoagulation for now as patient is not in atrial fibrillation.    Past medical history of atrial fibrillation on Eliquis aortic insufficiency, HTN, fibromyalgia  PRECAUTIONS: Fall and Other: NO lifting > 14 lbs, no driving  WEIGHT BEARING RESTRICTIONS: No  PAIN:  Are you having pain? Yes: NPRS scale: 6-7/10 Pain location: Lt hand Pain description: burning, constant  Aggravating factors: cold weather Relieving factors: meds, voltaren gel  FALLS: Has patient fallen in last 6 months? No  LIVING ENVIRONMENT: Lives with: daughter, Charlotte Hunter Lives in: House/apartment Stairs: No - Ramp has been built at front entrance Has following equipment at home: w/c, quad cane large base, Walker - 2 wheeled, and bed side commode  PLOF: Independent; pt did gardening prior to CVA - was not employed prior to onset of CVA   PATIENT GOALS: get my hand better, return to  gardening  OBJECTIVE:   HAND DOMINANCE: Right  ADLs: Eating: dependent for cutting food, mod I eating Grooming: mod I  UB Dressing: min assist w/ sports bra and getting shirt overhead LB Dressing: min assist w/ Lt shoe (d/t brace) and tying shoes Toileting: uses BSC - min assist for clothes management Bathing: seated - w/ min to assist for back, bottom, and feet Tub Shower transfers: grab bar, ? Tub transfer bench for  shower (w/ high thresh hold) - close sup to CGA  IADLs: Shopping: daughter performing now Light housekeeping: daughter performing now Meal Prep: pt can heat up things in microwave, make sandwich, however daughter cooking Community mobility: relies on daughter or friend for transportation Medication management: does w/ pillbox and strategies Financial management: mod I  Handwriting:  denies change (difficulty stabilizing check w/ Lt hand)   MOBILITY STATUS:  primarily uses w/c, sup using walker shorter distances   UPPER EXTREMITY ROM:  RUE AROM WNL's.  LUE: Pt has approx 60* sh flexion w/ no control and compensations into abd, IR and forearm pronation. Pt has approx 75% ER, 50% IR. Full elbow flex, 75% elbow ext and supination, wrist WFL's, 90% finger flexion    HAND FUNCTION: Grip strength: Right: 68.1 lbs; Left: 14.5 lbs  COORDINATION: Box & Blocks Lt = 7,  9 hole peg unable LUE Pt able to pick up pen Lt hand but unable to manipulate  SENSATION: Light touch: absent except slight detection more proximal hand - unable to localize. Absent in fingertips Proprioception: Impaired    MUSCLE TONE: LUE: dystonia   COGNITION: Overall cognitive status:  inconsistent w/ comprehension of questions during eval, short term memory deficits  VISION: Subjective report: diplopia resolved Baseline vision: Bifocals Visual history:  none  VISION ASSESSMENT: Not tested  Patient has difficulty with following activities due to following visual impairments: Pt reports some blurriness w/ reading     TODAY'S TREATMENT:                                                                            BP at end of session: 155/91, HR = 75  PATIENT EDUCATION: Education details: safety considerations d/t lack of sensation Lt hand, functional tasks to perform w/ Lt hand, shoulder HEP, and A/E recommendations (bath mitt, hands free blow dryer holder) Person educated: Patient Education method:  Explanation, Demonstration, Verbal cues, and Handouts Education comprehension: verbalized understanding and returned demonstration   HOME EXERCISE PROGRAM: 02/12/22: safety considerations d/t lack of sensation Lt hand, functional tasks to perform w/ Lt hand, shoulder HEP, and A/E recommendations (bath mitt, hands free blow dryer holder)   GOALS: Goals reviewed with patient? Yes  SHORT TERM GOALS: Target date: 03/09/22  Independent with initial HEP  Baseline: Goal status: IN PROGRESS  2.  Pt to cut food w/ A/E prn and report greater ease opening peel containers (yogurts, deli meat) Baseline:  Goal status: IN PROGRESS  3.  Pt to improve LUE functional use by demo improvement on Box & Blocks to 13 or more Baseline: 7 blocks Goal status: INITIAL  4.  Pt to perform UB dressing mod I level Baseline:  Goal status: INITIAL  5.  Pt to perform low level  functional reaching w/ min compensations to grasp/release 1" objects Baseline:  Goal status: INITIAL  6.  Pt to consistently perform clothes management w/ min assist from LUE w/o LOB Baseline:  Goal status: INITIAL  7.  Pt to verbalize understanding of safety strategies d/t lack of sensation Lt hand Baseline:  Goal status: INITIAL   LONG TERM GOALS: Target date: 05/07/22  Independent w/ updated HEP  Baseline:  Goal status: INITIAL  2.  Pt to improve LUE function as evidenced by performing Box & Blocks to 18 or more Baseline: 7 blocks Goal status: INITIAL  3.  Pt to improve [place] hand as evidenced by performing 9 hole peg test in under 2 min Baseline: unable Goal status: INITIAL  4.  Pt to be mod I for all ADLS (except sup for shower transfers)  Baseline:  Goal status: INITIAL  5.  Pt to perform 70* sh flexion for functional light reaching with min compensations LUE Baseline:  Goal status: INITIAL  6.  Grip strength Lt hand to be 20 lbs or greater Baseline: 14.5 lbs Goal status: INITIAL  7.  Pt will be mod I for  gardening task w/ DME/AE prn Baseline:  Goal status: INITIAL   ASSESSMENT:  CLINICAL IMPRESSION: Pt progressing towards first 2 STG's. Pt dominated by synergy pattern and limited by sensory deficits. Will benefit from continued O.T. to maximize function   PERFORMANCE DEFICITS: in functional skills including ADLs, IADLs, coordination, dexterity, proprioception, sensation, tone, ROM, strength, pain, Fine motor control, Gross motor control, mobility, balance, endurance, decreased knowledge of use of DME, and UE functional use, cognitive skills including memory, problem solving, and safety awareness.   IMPAIRMENTS: are limiting patient from ADLs, IADLs, leisure, and social participation.   CO-MORBIDITIES; may have co-morbidities  that affects occupational performance. Patient will benefit from skilled OT to address above impairments and improve overall function.  MODIFICATION OR ASSISTANCE TO COMPLETE EVALUATION: Min-Moderate modification of tasks or assist with assess necessary to complete an evaluation.  OT OCCUPATIONAL PROFILE AND HISTORY: Problem focused assessment: Including review of records relating to presenting problem.  CLINICAL DECISION MAKING: Moderate - several treatment options, min-mod task modification necessary  REHAB POTENTIAL: Good  EVALUATION COMPLEXITY: Low    PLAN:  OT FREQUENCY: 2x/week  OT DURATION: 12 weeks  PLANNED INTERVENTIONS: self care/ADL training, therapeutic exercise, therapeutic activity, neuromuscular re-education, passive range of motion, functional mobility training, aquatic therapy, splinting, fluidotherapy, moist heat, patient/family education, cognitive remediation/compensation, visual/perceptual remediation/compensation, coping strategies training, and DME and/or AE instructions  RECOMMENDED OTHER SERVICES: will monitor for speech eval needs if cognition deficits impeding ADLS  CONSULTED AND AGREED WITH PLAN OF CARE: Patient and family  member/caregiver  PLAN FOR NEXT SESSION: review HEP prn, continue simple use of LUE, supine sh flexion, neuro re-education, look at bathroom set up for shower transfers   Hans Eden, OT 02/12/2022, 10:18 AM

## 2022-02-12 NOTE — Patient Instructions (Signed)
  LOOK AT LEFT HAND WHEN USING IT   DO NOT USE LT HAND FOR ANYTHING: SHARP, HEAVY, BREAKABLE, OR HOT  ALWAYS CHECK TEMPERATURE OF WATER (FOR BATHING, WASHING HANDS) WITH RT HAND  THINGS TO DO WITH LT HAND:  Flip large cards over (w/ assist from other hand as needed)  Open drawers and lower cabinets from wheelchair level Side checkers along counter  Stacking checkers or placing in bowl Picking up cup  Helping wash Rt arm and leg (may need bath hand mitt or sponge)    Flexion (Assistive)    Sit up tall in chair: Hold Lt wrist w/ Rt hand, Lt thumb side up and raise arms to eye level or slightly higher, keeping elbows as straight as possible. Can be done sitting or lying. Repeat _10___ times. Do _2___ sessions per day.  Copyright  VHI. All rights reserved.     SHOULDER: Flexion On Table    Table slides: Place hands on table, elbows straight, folded towel under hands. Side towel along table away from body. Press hands down into table. _10__ reps per set, 2 sessions per day.

## 2022-02-14 ENCOUNTER — Ambulatory Visit: Payer: Medicare Other | Attending: Internal Medicine | Admitting: Occupational Therapy

## 2022-02-14 ENCOUNTER — Encounter: Payer: Self-pay | Admitting: Occupational Therapy

## 2022-02-14 ENCOUNTER — Ambulatory Visit: Payer: Medicare Other | Admitting: Physical Therapy

## 2022-02-14 DIAGNOSIS — R278 Other lack of coordination: Secondary | ICD-10-CM | POA: Insufficient documentation

## 2022-02-14 DIAGNOSIS — I69154 Hemiplegia and 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 left non-dominant side: Secondary | ICD-10-CM

## 2022-02-14 DIAGNOSIS — R2689 Other abnormalities of gait and mobility: Secondary | ICD-10-CM | POA: Diagnosis present

## 2022-02-14 DIAGNOSIS — R208 Other disturbances of skin sensation: Secondary | ICD-10-CM | POA: Insufficient documentation

## 2022-02-14 DIAGNOSIS — I69318 Other symptoms and signs involving cognitive functions following cerebral infarction: Secondary | ICD-10-CM | POA: Diagnosis present

## 2022-02-14 DIAGNOSIS — M6281 Muscle weakness (generalized): Secondary | ICD-10-CM | POA: Insufficient documentation

## 2022-02-14 DIAGNOSIS — R2681 Unsteadiness on feet: Secondary | ICD-10-CM

## 2022-02-14 DIAGNOSIS — R29818 Other symptoms and signs involving the nervous system: Secondary | ICD-10-CM | POA: Diagnosis present

## 2022-02-14 NOTE — Therapy (Signed)
OUTPATIENT PHYSICAL THERAPY NEURO TREATMENT NOTE       Patient Name: Charlotte Hunter MRN: [id number] DOB:1955/11/08, 66 y.o., female Today's Date: 02/14/2022   PCP: Su Grand, MD REFERRING PROVIDER: Bayard Hugger, NP    PT End of Session - 02/14/22 1017     Visit Number 14    Number of Visits 29    Date for PT Re-Evaluation 03/29/22    Authorization Type Medicare    Authorization Time Period 01-03-22 - 04-03-22    Progress Note Due on Visit 10    PT Start Time 1016    PT Stop Time 1058    PT Time Calculation (min) 42 min    Equipment Utilized During Treatment Gait belt   pt wearing her AFO:  clinic's RW used   Activity Tolerance Patient tolerated treatment well    Behavior During Therapy WFL for tasks assessed/performed                     Past Medical History:  Diagnosis Date   A-fib ([place])    Aortic insufficiency    Hypertension    Stroke [facility])    Past Surgical History:  Procedure Laterality Date   ABDOMINAL HYSTERECTOMY     ABLATION  05/03/2021   heart   c section  01/1968   KNEE SURGERY Right    meniscus   SHOULDER SURGERY Right 08/2014   Patient Active Problem List   Diagnosis Date Noted   ICH (intracerebral hemorrhage) ([place]) 10/01/2021   Paroxysmal atrial fibrillation ([place]) 09/29/2021   Stenosis of intracranial vessel 09/29/2021   Essential hypertension 09/29/2021   Blurry vision 09/29/2021   GERD (gastroesophageal reflux disease) 09/29/2021   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 09/25/2021    ONSET DATE: 09-25-21  REFERRING DIAG: I61.9 (ICD-10-CM) - Hemorrhagic stroke ([place])   THERAPY DIAG:  Hemiplegia and 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 left non-dominant side ([name])  Muscle weakness (generalized)  Other abnormalities of gait and mobility  Unsteadiness on feet  Rationale for Evaluation and Treatment Rehabilitation  SUBJECTIVE:                                                                                                                                                                                               SUBJECTIVE STATEMENT: Pt reports she did a lot around the house yesterday including vacuuming (from a w/c level), did some walking around the house, went to get her haircut and was able to safely get in/out of shampoo chair for the first time. Pt reports she thinks her deck surface will be  safe to walk over with RW, will try a few steps.  Pt accompanied by: self and daughter [name]  PERTINENT HISTORY: 66 year old female here for evaluation of right thalamic intracerebral hemorrhage. Patient is was admitted to the hospital in June 2023 for new onset left-sided weakness headache and nausea. She was found to have intracerebral hemorrhage in the right thalamus. Patient was on Eliquis anticoagulation for atrial fibrillation at the time and this was reversed medically. She was started on hypertonic saline, blood pressure control in ICU admission. Patient went to inpatient rehabilitation. Now patient back home. Still has left-sided weakness and left hand and arm and leg pain. Has been to cardiology who recommended to hold anticoagulation for now as patient is not in atrial fibrillation.   Past medical history of atrial fibrillation on Eliquis aortic insufficiency, hypertension, fibromyalgia  PAIN:  Are you having pain? Yes: NPRS scale: 6-7/10 Pain location: Rt knee - is scheduled to have cortisone injection on Friday this week  Pain description: constant, dull, aching Aggravating factors: swelling aggravates it Relieving factors: ice , Voltaren gel     PRECAUTIONS: Fall  WEIGHT BEARING RESTRICTIONS No  FALLS: Has patient fallen in last 6 months? No  LIVING ENVIRONMENT: Lives with: daughter, [name] Lives in: House/apartment Stairs: No - Ramp has been built at front entrance Has following equipment at home: Programmer, multimedia, Environmental consultant - 2 wheeled,  and bed side commode  PLOF: Independent; pt did gardening prior to CVA - was not employed prior to onset of CVA  PATIENT GOALS "be restored back to where I was"  OBJECTIVE:  GAIT: Gait pattern: decreased hip/knee flexion- Left, decreased ankle dorsiflexion- Left, and genu recurvatum- Left ; onset of L foot catching with onset of fatigue Distance walked: 10 ft; 35 ft Assistive device utilized: Quad cane large base Level of assistance: CGA and Min A Comments: increased anxiety with trial of LBQC this date especially when navigating obstacles, exhibits good ability to perform step-to gait pattern with [facility] with min cueing  NMR: Standing RLE target taps while standing with support from [facility] -2 x 10 reps -manual assist to control L knee hyperextension    TherEx:  Access Code: [place] - issued on 01-17-22 URL: [email or web address] Date: 01/17/2022 Prepared by: Ethelene Browns  Exercises - Seated Soleus Stretch  - 2-3 x daily - 7 x weekly - 1 sets - 2 reps - 15-20 secs  hold - Slant Board Gastrocnemius Stretch  - 3 x daily - 7 x weekly - 1 sets - 2 reps - 15-20 secs hold - Seated Gastroc Stretch with Strap  - 2-3 x daily - 7 x weekly - 1 sets - 2 reps - 15-20 hold   PATIENT EDUCATION:  01-17-22 Education details: continue HEP Person educated: Patient Education method: Explanation, Demonstration Education comprehension: verbalized understanding, demonstration   HOME EXERCISE PROGRAM:  Previously issued Access Code: [email or web address] URL: [email or web address] Date: 01/08/2022 Prepared by: Ethelene Browns  Exercises - Bridge  - 1 x daily - 7 x weekly - 1 sets - 10 reps - 5 hold - Single Leg Bridge  - 1 x daily - 7 x weekly - 1 sets - 10 reps - 3 sec hold - Hooklying Clamshell with Resistance  - 1 x daily - 7 x weekly - 1 sets - 10 reps - 2-3 sec hold - Supine Hip Flexion with Resistance Loop  - 1 x daily - 7 x weekly - 3 sets - 10 reps - Supine Heel Slide  -  1  x daily - 7 x weekly - 3 sets - 10 reps - Stepping out/in with LEFT leg  - 1 x daily - 7 x weekly - 3 sets - 10 reps - Hooklying Hamstring Stretch with Strap  - 1 x daily - 7 x weekly - 1 sets - 1-2 reps - 20 sec hold  GOALS: Goals reviewed with patient? Yes  SHORT TERM GOALS: Target date: 01/31/2022  Pt will perform basic transfers wheelchair to mat with supervision toward Rt side. Baseline: CGA Goal status: Progressing  - 01-28-22: pt requires CGA to SBA  2.  Amb. 115' with RW with CGA with AFO on LLE. Baseline: 27' with RW Goal status: Goal met 01-24-22  3.  Pt will stand for at least 5" with UE support prn with SBA:  pt will be able to reach 6" anteriorly without LOB with SBA for increased safety & independence with ADL's. Baseline:  Goal status: Goal met 01-29-22  4.  Perform bed mobility sit to/from supine with SBA.  Baseline: TBA Goal status: Partially met 01-28-22: pt uses rail on bed on Rt side to assist with supine to sit transfer; needs min assist to transfer supine to sit toward Lt side  5.  Assess Berg balance test and establish LTG as appropriate. Baseline:  score 16/56 (tested on 01-18-22) Goal status: Goal met   6.  Independent in HEP for LLE strengthening. Baseline:  Goal status: Goal met 01-24-22  NEW SHORT TERM GOALS:  TARGET DATE 02-28-22:   Perform basic transfers with supervision from wheelchair to/from mat toward Rt side. Baseline:  CGA to SBA in clinic Goal status:  NEW  2.  Amb. 230' with RW with SBA on flat, even surface to demo improved endurance.  Baseline:  58' with RW with CGA  Goal status:  NEW  3. Pt will report amb. In her bedroom at home with daughter's assistance with RW.   Baseline:  currently not attempting due to fear of falling  Goal status:  NEW  4.  Improve Berg score to >/= 26/56 to demo improved balance.  Baseline:  16/56 on 01-18-22  Goal status:  NEW  5.  Assess TUG (LTG is set)  Baseline:  TBA  Goal status:  NEW  6.   Amb. 18' with LBQC with min assist on flat, even surface.  Baseline:  not yet assessed due to dependent gait status  Goal status:  NEW    LONG TERM GOALS: Target date: 03/28/2022  Modified independent household amb. with appropriate assistive device (RW or LBQC).  Baseline:  Goal status: INITIAL  2.  Pt will increase Berg balance test score by at least 12 points to demo improved balance and to reduce fall risk.  Baseline: to be assessed Goal status: INITIAL  3.  Amb. 350' with RW with SBA for increased community accessibility.  Baseline:  Goal status: INITIAL  4.  Negotiate 4 steps with use of handrail with SBA using step by step sequence. Baseline: unable to perform at eval Goal status: INITIAL  5.  Pt will improve TUG score by at least 15 secs to demo improved functional mobility and reduced fall risk. Baseline:  Goal status: INITIAL  6.  Pt will negotiate ramp and curb with RW with SBA for increased community accessibility.  Baseline:  Goal status: INITIAL  ASSESSMENT:  CLINICAL IMPRESSION: Emphasis of skilled PT session on initiating trial use of LBQC during gait and continuing to work on [place]. Pt  exhibits good ability to use [facility] for short distance gait this session with step-to gait pattern and ongoing L knee hyperextension. Pt does fatigue very quickly with gait with LBQC this date as compared to use of RW during previous sessions. Encouraged pt to continue to use her RW for gait at home. Pt also continues to benefit from ongoing L NMR to improve strength and control of LLE. Continue POC.    OBJECTIVE IMPAIRMENTS Abnormal gait, decreased activity tolerance, decreased balance, decreased coordination, decreased endurance, decreased knowledge of use of DME, decreased ROM, decreased strength, impaired sensation, impaired tone, and impaired UE functional use.   ACTIVITY LIMITATIONS carrying, lifting, bending, standing, squatting, stairs, transfers, bed mobility, and  locomotion level  PARTICIPATION LIMITATIONS: meal prep, cleaning, laundry, driving, shopping, community activity, and yard work  [place] and severity of deficits  are also affecting patient's functional outcome.   REHAB POTENTIAL: Good  CLINICAL DECISION MAKING: Evolving/moderate complexity  EVALUATION COMPLEXITY: Moderate  PLAN: PT FREQUENCY: 3x/week for initial 4 weeks, followed by 2x/week for 8 weeks  PT DURATION: 12 weeks  PLANNED INTERVENTIONS: Therapeutic exercises, Therapeutic activity, Neuromuscular re-education, Balance training, Gait training, Patient/Family education, Self Care, Stair training, Orthotic/Fit training, DME instructions, Aquatic Therapy, and Wheelchair mobility training  PLAN FOR NEXT SESSION:  please do TUG with RW for baseline;  gait train with RW, - initiated gait training with RUE support on // bars on 02-11-22 - ; continue trial gait with LBQC, LLE NMR and knee control   Excell Seltzer, PT, DPT, [place] [address] [place], [place] [address] 02/14/2022, 11:03 AM

## 2022-02-14 NOTE — Therapy (Signed)
OUTPATIENT OCCUPATIONAL THERAPY NEURO TREATMENT  Patient Name: Charlotte Hunter MRN: [id number] DOB:02/08/1956, 66 y.o., female Today's Date: 02/14/2022  PCP: Su Grand, MD REFERRING PROVIDER: Bayard Hugger, NP    OT End of Session - 02/14/22 0941     Visit Number 3    Number of Visits 25    Date for OT Re-Evaluation 05/07/22    Authorization Type MCR, BC/BS    Authorization - Visit Number 3    Authorization - Number of Visits 10    Progress Note Due on Visit 10    OT Start Time [phone]    OT Stop Time 1018    OT Time Calculation (min) 41 min    Activity Tolerance Patient tolerated treatment well    Behavior During Therapy [facility] for tasks assessed/performed             Past Medical History:  Diagnosis Date   A-fib ([place])    Aortic insufficiency    Hypertension    Stroke [facility])    Past Surgical History:  Procedure Laterality Date   ABDOMINAL HYSTERECTOMY     ABLATION  05/03/2021   heart   c section  01/1968   KNEE SURGERY Right    meniscus   SHOULDER SURGERY Right 08/2014   Patient Active Problem List   Diagnosis Date Noted   ICH (intracerebral hemorrhage) ([place]) 10/01/2021   Paroxysmal atrial fibrillation ([place]) 09/29/2021   Stenosis of intracranial vessel 09/29/2021   Essential hypertension 09/29/2021   Blurry vision 09/29/2021   GERD (gastroesophageal reflux disease) 09/29/2021   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 Patient was admitted to the hospital in September 25, 2021 for new onset left-sided weakness headache and nausea. She was found to have intracerebral hemorrhage in the right thalamus. Patient was on Eliquis anticoagulation for atrial fibrillation at the time and this was reversed medically. She was started on hypertonic saline, blood pressure control in ICU admission. Patient went to inpatient rehabilitation. Then pt had HH therapies. Still has left-sided weakness and left hand and arm and leg pain. Has been to cardiology who recommended to hold anticoagulation for now as patient is not in atrial fibrillation.    Past medical history of atrial fibrillation on Eliquis aortic insufficiency, HTN, fibromyalgia  PRECAUTIONS: Fall and Other: NO lifting > 14 lbs, no driving  WEIGHT BEARING RESTRICTIONS: No  PAIN:  Are you having pain? Yes: NPRS scale: 5/10 Pain location: Lt hand Pain description: burning, constant  Aggravating factors: cold weather Relieving factors: meds, voltaren gel  FALLS: Has patient fallen in last 6 months? No  LIVING ENVIRONMENT: Lives with: daughter, [name] Lives in: House/apartment Stairs: No - Ramp has been built at front entrance Has following equipment at home: w/c, quad cane large base, Walker - 2 wheeled, and bed side commode  PLOF: Independent; pt did gardening prior to CVA - was not employed prior to onset of CVA   PATIENT GOALS: get my hand better, return to gardening  OBJECTIVE:   HAND DOMINANCE: Right  ADLs: Eating: dependent for cutting food, mod I eating Grooming: mod I  UB Dressing: min assist w/ sports bra and getting shirt overhead LB Dressing: min assist w/ Lt shoe (d/t brace) and tying shoes Toileting: uses BSC - min assist for clothes management Bathing: seated - w/ min to assist for back, bottom, and feet Tub Shower transfers: grab bar, ? Tub transfer bench for  shower (w/ high thresh hold) - close sup to CGA  IADLs: Shopping: daughter performing now Light housekeeping: daughter performing now Meal Prep: pt can heat up things in microwave, make sandwich, however daughter cooking Community mobility: relies on daughter or friend for transportation Medication management: does w/ pillbox and strategies Financial management: mod I  Handwriting:  denies change (difficulty stabilizing check w/ Lt hand)   MOBILITY STATUS:  primarily uses w/c, sup using walker shorter distances   UPPER EXTREMITY ROM:  RUE AROM WNL's.  LUE: Pt has approx 60* sh flexion w/ no control and compensations into abd, IR and forearm pronation. Pt has approx 75% ER, 50% IR. Full elbow flex, 75% elbow ext and supination, wrist WFL's, 90% finger flexion    HAND FUNCTION: Grip strength: Right: 68.1 lbs; Left: 14.5 lbs  COORDINATION: Box & Blocks Lt = 7,  9 hole peg unable LUE Pt able to pick up pen Lt hand but unable to manipulate  SENSATION: Light touch: absent except slight detection more proximal hand - unable to localize. Absent in fingertips Proprioception: Impaired    MUSCLE TONE: LUE: dystonia   COGNITION: Overall cognitive status:  inconsistent w/ comprehension of questions during eval, short term memory deficits  VISION: Subjective report: diplopia resolved Baseline vision: Bifocals Visual history:  none  VISION ASSESSMENT: Not tested  Patient has difficulty with following activities due to following visual impairments: Pt reports some blurriness w/ reading     TODAY'S TREATMENT:                                                                            Discussed shoulder positioning both sitting up and laying down in bed. Provided bed positioning handout.  Pt concerned about subluxation, however less than 1 finger width noted and discussed main concern would only be if someone pulled on arm incorrectly during transfers.   Seated at mat: worked on wt  bearing over LUE and body on arm movements w/ cues to think about the movement d/t poor sensation. Followed by AA/ROM in BUE sh reaches towards floor for low sh flex and elbow extension.   Supine: pt shown proper technique for self ROM in sh flex/ext; followed by AA/ROM in bilateral chest press movement w/ PVC frame, and min facilitation provided to LUE for elbow extension and support at wrist.   HOME EXERCISE PROGRAM: 02/12/22: safety considerations d/t lack of sensation Lt hand, functional tasks to perform w/ Lt hand, shoulder HEP, and A/E recommendations (bath mitt, hands free blow dryer holder)   GOALS: Goals reviewed with patient? Yes  SHORT TERM GOALS: Target date: 03/09/22  Independent with initial HEP  Baseline: Goal status: IN PROGRESS  2.  Pt to cut food w/ A/E prn and report greater ease opening peel  containers (yogurts, deli meat) Baseline:  Goal status: IN PROGRESS  3.  Pt to improve LUE functional use by demo improvement on Box & Blocks to 13 or more Baseline: 7 blocks Goal status: INITIAL  4.  Pt to perform UB dressing mod I level Baseline:  Goal status: INITIAL  5.  Pt to perform low level functional reaching w/ min compensations to grasp/release 1" objects Baseline:  Goal status: INITIAL  6.  Pt to consistently perform clothes management w/ min assist from LUE w/o LOB Baseline:  Goal status: INITIAL  7.  Pt to verbalize understanding of safety strategies d/t lack of sensation Lt hand Baseline:  Goal status: INITIAL   LONG TERM GOALS: Target date: 05/07/22  Independent w/ updated HEP  Baseline:  Goal status: INITIAL  2.  Pt to improve LUE function as evidenced by performing Box & Blocks to 18 or more Baseline: 7 blocks Goal status: INITIAL  3.  Pt to improve [place] hand as evidenced by performing 9 hole peg test in under 2 min Baseline: unable Goal status: INITIAL  4.  Pt to be mod I for all ADLS (except sup for shower transfers)  Baseline:   Goal status: INITIAL  5.  Pt to perform 70* sh flexion for functional light reaching with min compensations LUE Baseline:  Goal status: INITIAL  6.  Grip strength Lt hand to be 20 lbs or greater Baseline: 14.5 lbs Goal status: INITIAL  7.  Pt will be mod I for gardening task w/ DME/AE prn Baseline:  Goal status: INITIAL   ASSESSMENT:  CLINICAL IMPRESSION: Pt progressing towards first 2 STG's. Pt dominated by synergy pattern and limited by sensory deficits. Will benefit from continued O.T. to maximize function and safety   PERFORMANCE DEFICITS: in functional skills including ADLs, IADLs, coordination, dexterity, proprioception, sensation, tone, ROM, strength, pain, Fine motor control, Gross motor control, mobility, balance, endurance, decreased knowledge of use of DME, and UE functional use, cognitive skills including memory, problem solving, and safety awareness.   IMPAIRMENTS: are limiting patient from ADLs, IADLs, leisure, and social participation.   CO-MORBIDITIES; may have co-morbidities  that affects occupational performance. Patient will benefit from skilled OT to address above impairments and improve overall function.  MODIFICATION OR ASSISTANCE TO COMPLETE EVALUATION: Min-Moderate modification of tasks or assist with assess necessary to complete an evaluation.  OT OCCUPATIONAL PROFILE AND HISTORY: Problem focused assessment: Including review of records relating to presenting problem.  CLINICAL DECISION MAKING: Moderate - several treatment options, min-mod task modification necessary  REHAB POTENTIAL: Good  EVALUATION COMPLEXITY: Low    PLAN:  OT FREQUENCY: 2x/week  OT DURATION: 12 weeks  PLANNED INTERVENTIONS: self care/ADL training, therapeutic exercise, therapeutic activity, neuromuscular re-education, passive range of motion, functional mobility training, aquatic therapy, splinting, fluidotherapy, moist heat, patient/family education, cognitive  remediation/compensation, visual/perceptual remediation/compensation, coping strategies training, and DME and/or AE instructions  RECOMMENDED OTHER SERVICES: will monitor for speech eval needs if cognition deficits impeding ADLS  CONSULTED AND AGREED WITH PLAN OF CARE: Patient and family member/caregiver  PLAN FOR NEXT SESSION: continue neuro re-education LUE/trunk, look at bathroom set up for shower transfers   Hans Eden, OT 02/14/2022, 12:12 PM

## 2022-02-19 ENCOUNTER — Ambulatory Visit: Payer: Medicare Other | Admitting: Physical Therapy

## 2022-02-19 ENCOUNTER — Encounter: Payer: Self-pay | Admitting: Physical Medicine & Rehabilitation

## 2022-02-19 ENCOUNTER — Encounter: Payer: Medicare Other | Attending: Registered Nurse | Admitting: Physical Medicine & Rehabilitation

## 2022-02-19 ENCOUNTER — Ambulatory Visit: Payer: Medicare Other | Admitting: Occupational Therapy

## 2022-02-19 ENCOUNTER — Encounter: Payer: Self-pay | Admitting: Occupational Therapy

## 2022-02-19 VITALS — BP 133/77 | HR 102 | Ht 66.0 in | Wt 177.0 lb

## 2022-02-19 DIAGNOSIS — I619 Nontraumatic intracerebral hemorrhage, unspecified: Secondary | ICD-10-CM | POA: Insufficient documentation

## 2022-02-19 DIAGNOSIS — R278 Other lack of coordination: Secondary | ICD-10-CM

## 2022-02-19 DIAGNOSIS — I69154 Hemiplegia and 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 left non-dominant side: Secondary | ICD-10-CM

## 2022-02-19 DIAGNOSIS — R2689 Other abnormalities of gait and mobility: Secondary | ICD-10-CM

## 2022-02-19 DIAGNOSIS — R208 Other disturbances of skin sensation: Secondary | ICD-10-CM

## 2022-02-19 DIAGNOSIS — R2681 Unsteadiness on feet: Secondary | ICD-10-CM

## 2022-02-19 MED ORDER — GABAPENTIN 400 MG PO CAPS: ORAL_CAPSULE | ORAL | 1 refills | Status: DC

## 2022-02-19 NOTE — Progress Notes (Signed)
Subjective:    Patient ID: Charlotte Hunter, female    DOB: May 05, 1955, 66 y.o.   MRN: [id number]      66 y.o. right-handed female with history of hypertension, recurrent UTIs maintained on prophylactic Macrodantin aortic insufficiency, PAF with history of ablation maintained on Eliquis.  Her latest cardiology notes that she was planning Xarelto to see if this may be more affordable for her however it was never started and she was maintained on Eliquis.  Per chart review patient lives alone.  Daughter in the area.  Presented 09/25/2021 with acute onset of left-sided weakness as well as blurred vision.  CT of the head showed hemorrhage centered in the right thalamus and basal ganglia with mild surrounding edema, minimal midline shift and a small amount of intraventricular extension into the third and fourth ventricle.  Patient was reversed with Andexxa.  Follow-up MRI/MRA unchanged size of morphology of acute intraparenchymal hemorrhage.  No hydrocephalus or trapping.  There was a noted focal severe distal left P2 stenosis on MRA.  Admission chemistries unremarkable except potassium 3.2 urinalysis negative.  Echocardiogram with ejection fraction of 65 to 70% no wall motion abnormalities.  Patient did require short-term intubation through 09/27/2021.  Maintained on a mechanical soft nectar thick liquid diet and advance to thin liquids 10/01/2021.   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  Repeat CT of the head 10/11/2021 showed fading blood products within the right thalamus.  Regional hypodensity persist in my reflect a combination of residual edema and developing myelomalacia.  Mild regional mass effect diminished since 10/02/2021.  No new intracranial abnormality.  Patient noted history of PAF at this time would remain off of Eliquis and follow-up with cardiology services Dr.Komado of [facility].  Cardiac rate remained  controlled.  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  Admit date: 10/01/2021 Discharge date: 11/06/2021 HPI Chief complaint burning and tingling pain left hand left foot left buttocks. 66 year old female with history of right thalamic hemorrhage who was treated at [facility] and transferred to the inpatient rehab unit.  From there she discharged to home with supervision and intermittent assistance from her daughter. She complains of decreased sensation and abnormal sensation on the left side of the body she feels like there is a line running down the middle of the body and has abnormal feeling toward the left of the line.   Walking with quad  cane 115'    Left foot pain saw orthopedic MD in [facility] I dressing and bathing Except needs help with shower transfers  Makes coffee and does light meal prep  No blood in stool   Reduced Left hand swelling ,  Pain Inventory Average Pain 7 Pain Right Now 4 My pain is burning, dull, and tingling  In the last 24 hours, has pain interfered with the following? General activity 2 Relation with others 0 Enjoyment of life 0 What TIME of day is your pain at its worst? evening Sleep (in general) Good  Pain is worse with: walking, sitting, standing, and some activites Pain improves with: rest and medication Relief from Meds: 7  History reviewed. No pertinent family history. Social History   Socioeconomic History   Marital status: Single    Spouse name: Not on file  Number of children: 1   Years of education: Not on file   Highest education level: Not on file  Occupational History   Not on file  Tobacco Use   Smoking status: Never   Smokeless tobacco: Never  Vaping Use   Vaping Use: Never used  Substance and Sexual Activity   Alcohol use:  Not Currently    Alcohol/week: 1.0 standard drink of alcohol    Types: 1 Glasses of wine per week   Drug use: Never   Sexual activity: Not Currently  Other Topics Concern   Not on file  Social History Narrative   12/27/21 dgtr lives with her   Social Determinants of Health   Financial Resource Strain: Not on file  Food Insecurity: Not on file  Transportation Needs: Not on file  Physical Activity: Not on file  Stress: Not on file  Social Connections: Not on file   Past Surgical History:  Procedure Laterality Date   [place]  05/03/2021   heart   c section  01/1968   KNEE SURGERY Right    meniscus   SHOULDER SURGERY Right 08/2014   Past Surgical History:  Procedure Laterality Date   ABDOMINAL HYSTERECTOMY     ABLATION  05/03/2021   heart   c section  01/1968   KNEE SURGERY Right    meniscus   SHOULDER SURGERY Right 08/2014   Past Medical History:  Diagnosis Date   A-fib [facility])    Aortic insufficiency    Hypertension    Stroke ([place])    BP 133/77   Pulse (!) 102   Ht 5\' 6"  (1.676 m)   Wt 177 lb (80.3 kg) Comment: pt reported  SpO2 97%   BMI 28.57 kg/m   Opioid Risk Score:   Fall Risk Score:  `1  Depression screen [facility] 2/9     02/19/2022    2:01 PM 12/04/2021    1:22 PM  Depression screen PHQ 2/9  Decreased Interest 0 0  Down, Depressed, Hopeless 0 0  PHQ - 2 Score 0 0  Altered sleeping  0  Tired, decreased energy  0  Change in appetite  0  Feeling bad or failure about yourself   0  Trouble concentrating  0  Moving slowly or fidgety/restless  0  Suicidal thoughts  0  PHQ-9 Score  0      Review of Systems  Musculoskeletal:  Positive for back pain, gait problem and joint swelling.  All other systems reviewed and are negative.     Objective:   Physical Exam Vitals and nursing note reviewed.  HENT:     Head: Normocephalic and atraumatic.  Eyes:     Extraocular Movements: Extraocular movements intact.      Conjunctiva/sclera: Conjunctivae normal.     Pupils: Pupils are equal, round, and reactive to light.  Musculoskeletal:     Right lower leg: No edema.     Left lower leg: No edema.  Skin:    General: Skin is warm and dry.  Neurological:     General: No focal deficit present.     Mental Status: She is oriented to person, place, and time.  Psychiatric:        Mood and Affect: Mood normal.        Behavior: Behavior normal.   Motor strength is 3 - in the left deltoid, bicep, tricep, finger flexors and extensors 4 - at the left hip flexor knee extensor trace  ankle dorsiflexor Sensation reduced left hemifacial V1 and V2 has intact V3 sensation The patient has decreased sensation on the front of the torso on the left side around the clavicle as well as in the lumbar area on the left side. The patient has hyperpathia to pinprick left hand. Light touch sensation and proprioception reduced in the left upper limb. Right-sided strength and sensation is normal There is no evidence of facial droop Extraocular muscles are intact There is no evidence of aphasia or dysarthria. No evidence of left neglect. Per daughter her cognition is back to baseline       Assessment & Plan:   #1.  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  She is now ambulating with a quad cane in therapy.  She uses a left AFO. 2.  Thalamic pain syndrome affecting left hemibody has had some benefit with decreasing gabapentin from 100 mg to 300 mg.  She denies any sedation.  Will increase to gabapentin 400 mg 3 times daily.  We discussed that we may need to make some further titrations.  I will see the patient back in 6 weeks Continue outpatient therapy

## 2022-02-19 NOTE — Therapy (Unsigned)
OUTPATIENT PHYSICAL THERAPY NEURO TREATMENT NOTE       Patient Name: Micky Overturf MRN: [id number] DOB:1955-06-21, 66 y.o., female Today's Date: 02/20/2022   PCP: Su Grand, MD REFERRING PROVIDER: Bayard Hugger, NP    PT End of Session - 02/20/22 1600     Visit Number 15    Number of Visits 29    Date for PT Re-Evaluation 03/29/22    Authorization Type Medicare    Authorization Time Period 01-03-22 - 04-03-22    Progress Note Due on Visit 10    PT Start Time 1148    PT Stop Time 1230    PT Time Calculation (min) 42 min    Equipment Utilized During Treatment Gait belt   pt wearing her AFO:  LBQC used   Activity Tolerance Patient tolerated treatment well    Behavior During Therapy WFL for tasks assessed/performed                      Past Medical History:  Diagnosis Date   A-fib ([place])    Aortic insufficiency    Hypertension    Stroke [facility])    Past Surgical History:  Procedure Laterality Date   ABDOMINAL HYSTERECTOMY     ABLATION  05/03/2021   heart   c section  01/1968   KNEE SURGERY Right    meniscus   SHOULDER SURGERY Right 08/2014   Patient Active Problem List   Diagnosis Date Noted   ICH (intracerebral hemorrhage) ([place]) 10/01/2021   Paroxysmal atrial fibrillation ([place]) 09/29/2021   Stenosis of intracranial vessel 09/29/2021   Essential hypertension 09/29/2021   Blurry vision 09/29/2021   GERD (gastroesophageal reflux disease) 09/29/2021   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 09/25/2021    ONSET DATE: 09-25-21  REFERRING DIAG: I61.9 (ICD-10-CM) - Hemorrhagic stroke ([place])   THERAPY DIAG:  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  Has not walked any over the weekend - really hasn't walked since last Thursday in PT  Pt accompanied by: self and daughter  [name]  PERTINENT HISTORY: 66 year old female here for evaluation of right thalamic intracerebral hemorrhage. Patient is was admitted to the hospital in June 2023 for new onset left-sided weakness headache and nausea. She was found to have intracerebral hemorrhage in the right thalamus. Patient was on Eliquis anticoagulation for atrial fibrillation at the time and this was reversed medically. She was started on hypertonic saline, blood pressure control in ICU admission. Patient went to inpatient rehabilitation. Now patient back home. Still has left-sided weakness and left hand and arm and leg pain. Has been to cardiology who recommended to hold anticoagulation for now as patient is not in atrial fibrillation.   Past medical history of atrial fibrillation on Eliquis aortic insufficiency, hypertension, fibromyalgia  PAIN:  Are you having pain? Yes: NPRS scale: 4/10 Pain location: Rt knee -  Pain description: constant, dull, aching Aggravating factors: swelling aggravates it Relieving factors: ice , Voltaren gel     PRECAUTIONS: Fall  WEIGHT BEARING RESTRICTIONS No  FALLS: Has patient fallen in last 6 months? No  LIVING ENVIRONMENT: Lives with: daughter, [name] Lives in: House/apartment Stairs: No - Ramp has been built at front entrance Has following equipment at home: Programmer, multimedia, Environmental consultant - 2 wheeled, and bed side commode  PLOF: Independent; pt did gardening prior to CVA - was not employed prior to onset of CVA  PATIENT GOALS "be restored back to where I  was"  OBJECTIVE:  GAIT: Gait pattern: decreased hip/knee flexion- Left, decreased ankle dorsiflexion- Left, and genu recurvatum- Left ; onset of L foot catching with onset of fatigue Distance walked: 81' x 1;  70' x 1 on 2nd rep Assistive device utilized: Quad cane large base Level of assistance: CGA and Min A Comments: Lt foot noted to internally rotate slightly more in today's session compared to that in previous session; pt initially reported moderate pain in Lt hip flexor and upper quad region during beginning of gait training; appeared to be due to increased spasticity as pain decreased as session progressed (with weight bearing)    TherAct:  pt transferred wheelchair to mat toward Rt side at start of session with SBA; transferred mat to wheelchair toward Lt side with CGA to min assist    TUG score 2" 1 sec with [facility] with CGA for safety  TherEx:  Access Code: [place] - issued on 01-17-22 URL: [email or web address] Date: 01/17/2022 Prepared by: Ethelene Browns  Exercises - Seated Soleus Stretch  - 2-3 x daily - 7 x weekly - 1 sets - 2 reps - 15-20 secs  hold - Slant Board Gastrocnemius Stretch  - 3 x daily - 7 x weekly - 1 sets - 2 reps - 15-20 secs hold - Seated Gastroc Stretch with Strap  - 2-3 x daily - 7 x weekly - 1 sets - 2 reps - 15-20 hold   PATIENT EDUCATION:  01-17-22 Education details: continue HEP Person educated: Patient Education method: Explanation, Demonstration Education comprehension: verbalized understanding, demonstration   HOME EXERCISE PROGRAM:  Previously issued Access Code: [email or web address] URL: [email or web address] Date: 01/08/2022 Prepared by: Ethelene Browns  Exercises - Bridge  - 1 x daily - 7 x weekly - 1 sets - 10 reps - 5 hold - Single Leg Bridge  - 1 x daily - 7 x weekly - 1 sets - 10 reps - 3 sec hold - Hooklying Clamshell with Resistance  - 1 x daily - 7 x weekly - 1 sets - 10 reps - 2-3 sec hold - Supine Hip  Flexion  with Resistance Loop  - 1 x daily - 7 x weekly - 3 sets - 10 reps - Supine Heel Slide  - 1 x daily - 7 x weekly - 3 sets - 10 reps - Stepping out/in with LEFT leg  - 1 x daily - 7 x weekly - 3 sets - 10 reps - Hooklying Hamstring Stretch with Strap  - 1 x daily - 7 x weekly - 1 sets - 1-2 reps - 20 sec hold  GOALS: Goals reviewed with patient? Yes  SHORT TERM GOALS: Target date: 01/31/2022  Pt will perform basic transfers wheelchair to mat with supervision toward Rt side. Baseline: CGA Goal status: Progressing  - 01-28-22: pt requires CGA to SBA  2.  Amb. 115' with RW with CGA with AFO on LLE. Baseline: 68' with RW Goal status: Goal met 01-24-22  3.  Pt will stand for at least 5" with UE support prn with SBA:  pt will be able to reach 6" anteriorly without LOB with SBA for increased safety & independence with ADL's. Baseline:  Goal status: Goal met 01-29-22  4.  Perform bed mobility sit to/from supine with SBA.  Baseline: TBA Goal status: Partially met 01-28-22: pt uses rail on bed on Rt side to assist with supine to sit transfer; needs min assist to transfer supine to sit toward Lt side  5.  Assess Berg balance test and establish LTG as appropriate. Baseline:  score 16/56 (tested on 01-18-22) Goal status: Goal met   6.  Independent in HEP for LLE strengthening. Baseline:  Goal status: Goal met 01-24-22  NEW SHORT TERM GOALS:  TARGET DATE 02-28-22:   Perform basic transfers with supervision from wheelchair to/from mat toward Rt side. Baseline:  CGA to SBA in clinic Goal status:  NEW  2.  Amb. 230' with RW with SBA on flat, even surface to demo improved endurance.  Baseline:  68' with RW with CGA  Goal status:  NEW  3. Pt will report amb. In her bedroom at home with daughter's assistance with RW.   Baseline:  currently not attempting due to fear of falling  Goal status:  NEW  4.  Improve Berg score to >/= 26/56 to demo improved balance.  Baseline:  16/56 on  01-18-22  Goal status:  NEW  5.  Assess TUG (LTG is set)  Baseline:  2"1 sec with [facility]  Goal status:  Goal met 02-19-22  6.  Amb. 1' with LBQC with min assist on flat, even surface.  Baseline:  45' x 1, 70' x 1 with [facility]  Goal status: Goal met 02-19-22    LONG TERM GOALS: Target date: 03/28/2022  Modified independent household amb. with appropriate assistive device (RW or LBQC).  Baseline:  Goal status: INITIAL  2.  Pt will increase Berg balance test score by at least 12 points to demo improved balance and to reduce fall risk.  Baseline: to be assessed Goal status: INITIAL  3.  Amb. 350' with RW with SBA for increased community accessibility.  Baseline:  Goal status: INITIAL  4.  Negotiate 4 steps with use of handrail with SBA using step by step sequence. Baseline: unable to perform at eval Goal status: INITIAL  5.  Pt will improve TUG score by at least 15 secs to demo improved functional mobility and reduced fall risk. Baseline:  Goal status: INITIAL  6.  Pt will negotiate ramp and curb with RW with SBA for increased community accessibility.  Baseline:  Goal status: INITIAL  ASSESSMENT:  CLINICAL IMPRESSION: PT session focused on gait training with [facility] with pt initially ambulating in // bars "for warm up" due to not having ambulated since last Thurs, 02-14-22.   Pt initially reported pain in Lt hip and upper quad with gait training with [facility], but this pain decreased with weight bearing with continued standing and ambulation.  Pain appeared to be due to increased spasticity and hip flexor tightness.  Pt amb. Furthest distance to date with LBQC (12') with CGA to min assist.  Updated STG's#5 and 6 met.  Continue POC.    OBJECTIVE IMPAIRMENTS Abnormal gait, decreased activity tolerance, decreased balance, decreased coordination, decreased endurance, decreased knowledge of use of DME, decreased ROM, decreased strength, impaired sensation, impaired tone, and impaired UE  functional use.   ACTIVITY LIMITATIONS carrying, lifting, bending, standing, squatting, stairs, transfers, bed mobility, and locomotion level  PARTICIPATION LIMITATIONS: meal prep, cleaning, laundry, driving, shopping, community activity, and yard work  [place] and severity of deficits  are also affecting patient's functional outcome.   REHAB POTENTIAL: Good  CLINICAL DECISION MAKING: Evolving/moderate complexity  EVALUATION COMPLEXITY: Moderate  PLAN: PT FREQUENCY: 3x/week for initial 4 weeks, followed by 2x/week for 8 weeks  PT DURATION: 12 weeks  PLANNED INTERVENTIONS: Therapeutic exercises, Therapeutic activity, Neuromuscular re-education, Balance training, Gait training, Patient/Family education, Self Care, Stair training, Orthotic/Fit training, DME instructions, Aquatic Therapy, and Wheelchair mobility training  PLAN FOR NEXT SESSION:  please do TUG with RW for baseline;  gait train with RW, - initiated gait training with RUE support on // bars on 02-11-22 - ; continue trial gait with LBQC, LLE NMR and knee control   Arrick Dutton, [place], Walker [address] [place], [place] [address] 02/20/2022, 4:03 PM

## 2022-02-19 NOTE — Therapy (Signed)
OUTPATIENT OCCUPATIONAL THERAPY NEURO TREATMENT  Patient Name: Charlotte Hunter MRN: [id number] DOB:08/04/1955, 66 y.o., female Today's Date: 02/19/2022  PCP: Charlotte Grand, MD REFERRING PROVIDER: Bayard Hugger, NP    OT End of Session - 02/19/22 1236     Visit Number 4    Number of Visits 25    Date for OT Re-Evaluation 05/07/22    Authorization Type MCR, BC/BS    Authorization - Visit Number 4    Authorization - Number of Visits 10    Progress Note Due on Visit 10    OT Start Time [id number]    OT Stop Time 1315    OT Time Calculation (min) 40 min    Activity Tolerance Patient tolerated treatment well    Behavior During Therapy WFL for tasks assessed/performed             Past Medical History:  Diagnosis Date   A-fib ([place])    Aortic insufficiency    Hypertension    Stroke [facility])    Past Surgical History:  Procedure Laterality Date   ABDOMINAL HYSTERECTOMY     ABLATION  05/03/2021   heart   c section  01/1968   KNEE SURGERY Right    meniscus   SHOULDER SURGERY Right 08/2014   Patient Active Problem List   Diagnosis Date Noted   ICH (intracerebral hemorrhage) ([place]) 10/01/2021   Paroxysmal atrial fibrillation ([place]) 09/29/2021   Stenosis of intracranial vessel 09/29/2021   Essential hypertension 09/29/2021   Blurry vision 09/29/2021   GERD (gastroesophageal reflux disease) 09/29/2021   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 Patient was admitted to the hospital in September 25, 2021 for new onset left-sided weakness headache and nausea. She was found to have intracerebral hemorrhage in the right thalamus. Patient was on Eliquis anticoagulation for atrial fibrillation at the time and this was reversed medically. She was started on hypertonic saline, blood pressure control in ICU admission. Patient went to inpatient rehabilitation. Then pt had HH therapies. Still has left-sided weakness and left hand and arm and leg pain. Has been to cardiology who recommended to hold anticoagulation for now as patient is not in atrial fibrillation.    Past medical history of atrial fibrillation on Eliquis aortic insufficiency, HTN, fibromyalgia  PRECAUTIONS: Fall and Other: NO lifting > 14 lbs, no driving  WEIGHT BEARING RESTRICTIONS: No  PAIN:  Are you having pain? Yes: NPRS scale: 5/10 Pain location: Lt hand Pain description: burning, constant  Aggravating factors: cold weather Relieving factors: meds, voltaren gel  FALLS: Has patient fallen in last 6 months? No  LIVING ENVIRONMENT: Lives with: daughter, Charlotte Hunter Lives in: House/apartment Stairs: No - Ramp has been built at front entrance Has following equipment at home: w/c, quad cane large base, Walker - 2 wheeled, and bed side commode  PLOF: Independent; pt did gardening prior to CVA - was not employed prior to onset of CVA   PATIENT GOALS: get my hand better,  return to gardening  OBJECTIVE:   HAND DOMINANCE: Right  ADLs: Eating: dependent for cutting food, mod I eating Grooming: mod I  UB Dressing: min assist w/ sports bra and getting shirt overhead LB Dressing: min assist w/ Lt shoe (d/t brace) and tying shoes Toileting: uses BSC - min assist for clothes management Bathing: seated - w/ min to assist for back, bottom, and feet Tub Shower transfers: grab bar, ? Tub transfer bench for  shower (w/ high thresh hold) - close sup to CGA  IADLs: Shopping: daughter performing now Light housekeeping: daughter performing now Meal Prep: pt can heat up things in microwave, make sandwich, however daughter cooking Community mobility: relies on daughter or friend for transportation Medication management: does w/ pillbox and strategies Financial management: mod I  Handwriting:  denies change (difficulty stabilizing check w/ Lt hand)   MOBILITY STATUS:  primarily uses w/c, sup using walker shorter distances   UPPER EXTREMITY ROM:  RUE AROM WNL's.  LUE: Pt has approx 60* sh flexion w/ no control and compensations into abd, IR and forearm pronation. Pt has approx 75% ER, 50% IR. Full elbow flex, 75% elbow ext and supination, wrist WFL's, 90% finger flexion    HAND FUNCTION: Grip strength: Right: 68.1 lbs; Left: 14.5 lbs  COORDINATION: Box & Blocks Lt = 7,  9 hole peg unable LUE Pt able to pick up pen Lt hand but unable to manipulate  SENSATION: Light touch: absent except slight detection more proximal hand - unable to localize. Absent in fingertips Proprioception: Impaired    MUSCLE TONE: LUE: dystonia   COGNITION: Overall cognitive status:  inconsistent w/ comprehension of questions during eval, short term memory deficits  VISION: Subjective report: diplopia resolved Baseline vision: Bifocals Visual history:  none  VISION ASSESSMENT: Not tested  Patient has difficulty with following activities due to following visual impairments: Pt reports some blurriness w/ reading     TODAY'S TREATMENT:                                                                            Reviewed LUE positioning both sitting up and laying down in bed.   Seated at mat: worked on wt bearing over LUE and body on arm movements w/ cues to think about the movement d/t poor sensation. Followed by AA/ROM in BUE sh reaches towards floor for low sh flex and elbow extension. Unilateral AA/ROM LUE  using UE Ranger w/ focus on flex and horizontal abduction w/ controlled return to starting position and trying to prevent IR and adduction. Encouraged [facility] assist for light low level reaching Lt hand (supporting forearm with Rt hand)   Supine:reviewed proper technique for self ROM in sh flex/ext; followed by AA/ROM in bilateral chest press movement w/ PVC frame, and min facilitation provided to LUE for elbow extension and support at wrist.   *noted laxity and MP hyperextension Lt thumb - worked on palmer abduction and opposition w/ min support at thumb required  HOME EXERCISE PROGRAM: 02/12/22: safety considerations d/t lack of sensation Lt hand, functional tasks to perform w/ Lt hand, shoulder HEP, and A/E recommendations (bath mitt, hands free blow dryer holder)   GOALS: Goals reviewed with patient? Yes  SHORT TERM GOALS: Target date: 03/09/22  Independent with initial HEP  Baseline: Goal status: IN PROGRESS  2.  Pt to cut food w/ A/E prn and report greater ease opening peel containers (yogurts, deli meat) Baseline:  Goal status: IN PROGRESS  3.  Pt to improve LUE functional use by demo improvement on Box & Blocks to 13 or more Baseline: 7 blocks Goal status: INITIAL  4.  Pt to perform UB dressing mod I level Baseline:  Goal status: INITIAL  5.  Pt to perform low level functional reaching w/ min compensations to grasp/release 1" objects Baseline:  Goal status: INITIAL  6.  Pt to consistently perform clothes management w/ min assist from LUE w/o LOB Baseline:  Goal status: INITIAL  7.  Pt to verbalize understanding of safety strategies d/t lack of sensation Lt hand Baseline:  Goal status: INITIAL   LONG TERM GOALS: Target date: 05/07/22  Independent w/ updated HEP  Baseline:  Goal status: INITIAL  2.  Pt to improve LUE function as evidenced by performing Box & Blocks to 18 or more Baseline: 7 blocks Goal status: INITIAL  3.  Pt to improve Louise hand as evidenced  by performing 9 hole peg test in under 2 min Baseline: unable Goal status: INITIAL  4.  Pt to be mod I for all ADLS (except sup for shower transfers)  Baseline:  Goal status: INITIAL  5.  Pt to perform 70* sh flexion for functional light reaching with min compensations LUE Baseline:  Goal status: INITIAL  6.  Grip strength Lt hand to be 20 lbs or greater Baseline: 14.5 lbs Goal status: INITIAL  7.  Pt will be mod I for gardening task w/ DME/AE prn Baseline:  Goal status: INITIAL   ASSESSMENT:  CLINICAL IMPRESSION: Pt progressing towards first 2 STG's. Pt dominated by synergy pattern and limited by sensory deficits. Will benefit from continued O.T. to maximize function and safety   PERFORMANCE DEFICITS: in functional skills including ADLs, IADLs, coordination, dexterity, proprioception, sensation, tone, ROM, strength, pain, Fine motor control, Gross motor control, mobility, balance, endurance, decreased knowledge of use of DME, and UE functional use, cognitive skills including memory, problem solving, and safety awareness.   IMPAIRMENTS: are limiting patient from ADLs, IADLs, leisure, and social participation.   CO-MORBIDITIES; may have co-morbidities  that affects occupational performance. Patient will benefit from skilled OT to address above impairments and improve overall function.  MODIFICATION OR ASSISTANCE TO COMPLETE EVALUATION: Min-Moderate modification of tasks or assist with assess necessary to complete an evaluation.  OT OCCUPATIONAL PROFILE AND HISTORY: Problem focused assessment: Including review of records relating to presenting problem.  CLINICAL DECISION MAKING: Moderate - several treatment options, min-mod task modification necessary  REHAB POTENTIAL: Good  EVALUATION COMPLEXITY: Low    PLAN:  OT FREQUENCY: 2x/week  OT DURATION: 12 weeks  PLANNED INTERVENTIONS: self care/ADL training, therapeutic exercise, therapeutic activity, neuromuscular  re-education, passive range of motion, functional mobility training, aquatic therapy, splinting, fluidotherapy, moist heat, patient/family education, cognitive remediation/compensation, visual/perceptual remediation/compensation, coping strategies training, and DME and/or AE instructions  RECOMMENDED OTHER SERVICES: will monitor for speech eval needs if cognition deficits impeding ADLS  CONSULTED AND AGREED WITH PLAN OF CARE: Patient and family member/caregiver  PLAN FOR NEXT SESSION: continue neuro re-education LUE/trunk, look at bathroom set up for shower transfers   Hans Eden, OT 02/19/2022, 12:37 PM

## 2022-02-20 ENCOUNTER — Encounter: Payer: Self-pay | Admitting: Physical Therapy

## 2022-02-21 ENCOUNTER — Ambulatory Visit: Payer: Medicare Other | Admitting: Occupational Therapy

## 2022-02-21 ENCOUNTER — Ambulatory Visit: Payer: Medicare Other | Admitting: Physical Therapy

## 2022-02-21 DIAGNOSIS — R2681 Unsteadiness on feet: Secondary | ICD-10-CM

## 2022-02-21 DIAGNOSIS — R278 Other lack of coordination: Secondary | ICD-10-CM

## 2022-02-21 DIAGNOSIS — M6281 Muscle weakness (generalized): Secondary | ICD-10-CM

## 2022-02-21 DIAGNOSIS — I69318 Other symptoms and signs involving cognitive functions following cerebral infarction: Secondary | ICD-10-CM

## 2022-02-21 DIAGNOSIS — I69154 Hemiplegia and 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 left non-dominant side: Secondary | ICD-10-CM | POA: Diagnosis not present

## 2022-02-21 DIAGNOSIS — R2689 Other abnormalities of gait and mobility: Secondary | ICD-10-CM

## 2022-02-21 DIAGNOSIS — R208 Other disturbances of skin sensation: Secondary | ICD-10-CM

## 2022-02-21 DIAGNOSIS — R29818 Other symptoms and signs involving the nervous system: Secondary | ICD-10-CM

## 2022-02-21 NOTE — Therapy (Signed)
OUTPATIENT OCCUPATIONAL THERAPY NEURO TREATMENT  Patient Name: Charlotte Hunter MRN: [id number] DOB:03-16-1956, 66 y.o., female Today's Date: 02/22/2022  PCP: Su Grand, MD REFERRING PROVIDER: Bayard Hugger, NP    OT End of Session - 02/22/22 1316     Visit Number 5    Number of Visits 25    Date for OT Re-Evaluation 05/07/22    Authorization Type MCR, BC/BS    Authorization - Visit Number 5    OT Start Time [phone]    OT Stop Time [id number]    OT Time Calculation (min) 40 min              Past Medical History:  Diagnosis Date   A-fib ([place])    Aortic insufficiency    Hypertension    Stroke [facility])    Past Surgical History:  Procedure Laterality Date   ABDOMINAL HYSTERECTOMY     ABLATION  05/03/2021   heart   c section  01/1968   KNEE SURGERY Right    meniscus   SHOULDER SURGERY Right 08/2014   Patient Active Problem List   Diagnosis Date Noted   ICH (intracerebral hemorrhage) ([place]) 10/01/2021   Paroxysmal atrial fibrillation ([place]) 09/29/2021   Stenosis of intracranial vessel 09/29/2021   Essential hypertension 09/29/2021   Blurry vision 09/29/2021   GERD (gastroesophageal reflux disease) 09/29/2021   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 09/25/2021    ONSET DATE: 12/14/2021 Referral date  REFERRING DIAG: I61.9 (ICD-10-CM) - Hemorrhagic stroke ([place])   THERAPY DIAG:  Hemiplegia and 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 left non-dominant side ([name])  Other abnormalities of gait and mobility  Unsteadiness on feet  Other lack of coordination  Other disturbances of skin sensation  Muscle weakness (generalized)  Other symptoms and signs involving cognitive functions following cerebral infarction  Other symptoms and signs involving the nervous system  Rationale for Evaluation and Treatment Rehabilitation  SUBJECTIVE:   SUBJECTIVE STATEMENT: Pt reports pain in hand Pt accompanied by: self    PERTINENT HISTORY: 66 year old female here for evaluation of right thalamic intracerebral hemorrhage. Patient was admitted to the hospital in September 25, 2021 for new onset left-sided weakness headache and nausea. She was found to have intracerebral hemorrhage in the right thalamus. Patient was on Eliquis anticoagulation for atrial fibrillation at the time and this was reversed medically. She was started on hypertonic saline, blood pressure control in ICU admission. Patient went to inpatient rehabilitation. Then pt had HH therapies. Still has left-sided weakness and left hand and arm and leg pain. Has been to cardiology who recommended to hold anticoagulation for now as patient is not in atrial fibrillation.    Past medical history of atrial fibrillation on Eliquis aortic insufficiency, HTN, fibromyalgia  PRECAUTIONS: Fall and Other: NO lifting > 14 lbs, no driving  WEIGHT BEARING RESTRICTIONS: No  PAIN:  Are you having pain? Yes: NPRS scale: 6/10 Pain location: Lt hand Pain description: burning, constant  Aggravating factors: cold weather Relieving factors: meds, voltaren gel  FALLS: Has patient fallen in last 6 months? No  LIVING ENVIRONMENT: Lives with: daughter, [name] Lives in: House/apartment Stairs: No - Ramp has been built at front entrance Has following equipment at home: w/c, quad cane large base, Walker - 2 wheeled, and bed side commode  PLOF: Independent; pt did gardening prior to CVA - was not employed prior to onset of CVA   PATIENT GOALS: get my hand better, return to gardening  OBJECTIVE: from eval  HAND  DOMINANCE: Right  ADLs: Eating: dependent for cutting food, mod I eating Grooming: mod I  UB Dressing: min assist w/ sports bra and getting shirt overhead LB Dressing: min assist w/ Lt shoe (d/t brace) and tying shoes Toileting: uses BSC - min assist for clothes management Bathing: seated - w/ min to assist for back, bottom, and feet Tub Shower transfers: grab  bar, ? Tub transfer bench for shower (w/ high thresh hold) - close sup to CGA  IADLs: Shopping: daughter performing now Light housekeeping: daughter performing now Meal Prep: pt can heat up things in microwave, make sandwich, however daughter cooking Community mobility: relies on daughter or friend for transportation Medication management: does w/ pillbox and strategies Financial management: mod I  Handwriting:  denies change (difficulty stabilizing check w/ Lt hand)   MOBILITY STATUS:  primarily uses w/c, sup using walker shorter distances   UPPER EXTREMITY ROM:  RUE AROM WNL's.  LUE: Pt has approx 60* sh flexion w/ no control and compensations into abd, IR and forearm pronation. Pt has approx 75% ER, 50% IR. Full elbow flex, 75% elbow ext and supination, wrist WFL's, 90% finger flexion    HAND FUNCTION: Grip strength: Right: 68.1 lbs; Left: 14.5 lbs  COORDINATION: Box & Blocks Lt = 7,  9 hole peg unable LUE Pt able to pick up pen Lt hand but unable to manipulate  SENSATION: Light touch: absent except slight detection more proximal hand - unable to localize. Absent in fingertips Proprioception: Impaired    MUSCLE TONE: LUE: dystonia   COGNITION: Overall cognitive status:  inconsistent w/ comprehension of questions during eval, short term memory deficits  VISION: Subjective report: diplopia resolved Baseline vision: Bifocals Visual history:  none  VISION ASSESSMENT: Not tested  Patient has difficulty with following activities due to following visual impairments: Pt reports some blurriness w/ reading     TODAY'S TREATMENT:    Seated at mat: worked on wt bearing over LUE and body on arm movements , and lateral weight shifts fot bring elbow to mat  Followed by AA/ROM in BUE sh reaches towards floor for low sh flex and elbow extension.   Seated supination/ pronation with boomwhacker in L hand min facillitation/ v.c  Unilateral AA/ROM LUE using UE Ranger with for  flex and horizontal abduction with controlled return to starting position min-mod facillitation/ v.c         HOME EXERCISE PROGRAM: 02/12/22: safety considerations d/t lack of sensation Lt hand, functional tasks to perform w/ Lt hand, shoulder HEP, and A/E recommendations (bath mitt, hands free blow dryer holder)   GOALS: Goals reviewed with patient? Yes  SHORT TERM GOALS: Target date: 03/09/22  Independent with initial HEP  Baseline: Goal status: IN PROGRESS  2.  Pt to cut food w/ A/E prn and report greater ease opening peel containers (yogurts, deli meat) Baseline:  Goal status: IN PROGRESS  3.  Pt to improve LUE functional use by demo improvement on Box & Blocks to 13 or more Baseline: 7 blocks Goal status: INITIAL  4.  Pt to perform UB dressing mod I level Baseline:  Goal status: INITIAL  5.  Pt to perform low level functional reaching w/ min compensations to grasp/release 1" objects Baseline:  Goal status: INITIAL  6.  Pt to consistently perform clothes management w/ min assist from LUE w/o LOB Baseline:  Goal status: INITIAL  7.  Pt to verbalize understanding of safety strategies d/t lack of sensation Lt hand Baseline:  Goal status:  INITIAL   LONG TERM GOALS: Target date: 05/07/22  Independent w/ updated HEP  Baseline:  Goal status: INITIAL  2.  Pt to improve LUE function as evidenced by performing Box & Blocks to 18 or more Baseline: 7 blocks Goal status: INITIAL  3.  Pt to improve Bells hand as evidenced by performing 9 hole peg test in under 2 min Baseline: unable Goal status: INITIAL  4.  Pt to be mod I for all ADLS (except sup for shower transfers)  Baseline:  Goal status: INITIAL  5.  Pt to perform 70* sh flexion for functional light reaching with min compensations LUE Baseline:  Goal status: INITIAL  6.  Grip strength Lt hand to be 20 lbs or greater Baseline: 14.5 lbs Goal status: INITIAL  7.  Pt will be mod I for gardening task w/  DME/AE prn Baseline:  Goal status: INITIAL   ASSESSMENT:  CLINICAL IMPRESSION: Pt progressing towards short term goals. She is limited by sensory deficits. PERFORMANCE DEFICITS: in functional skills including ADLs, IADLs, coordination, dexterity, proprioception, sensation, tone, ROM, strength, pain, Fine motor control, Gross motor control, mobility, balance, endurance, decreased knowledge of use of DME, and UE functional use, cognitive skills including memory, problem solving, and safety awareness.   IMPAIRMENTS: are limiting patient from ADLs, IADLs, leisure, and social participation.   CO-MORBIDITIES; may have co-morbidities  that affects occupational performance. Patient will benefit from skilled OT to address above impairments and improve overall function.  MODIFICATION OR ASSISTANCE TO COMPLETE EVALUATION: Min-Moderate modification of tasks or assist with assess necessary to complete an evaluation.  OT OCCUPATIONAL PROFILE AND HISTORY: Problem focused assessment: Including review of records relating to presenting problem.  CLINICAL DECISION MAKING: Moderate - several treatment options, min-mod task modification necessary  REHAB POTENTIAL: Good  EVALUATION COMPLEXITY: Low    PLAN:  OT FREQUENCY: 2x/week  OT DURATION: 12 weeks  PLANNED INTERVENTIONS: self care/ADL training, therapeutic exercise, therapeutic activity, neuromuscular re-education, passive range of motion, functional mobility training, aquatic therapy, splinting, fluidotherapy, moist heat, patient/family education, cognitive remediation/compensation, visual/perceptual remediation/compensation, coping strategies training, and DME and/or AE instructions  RECOMMENDED OTHER SERVICES: will monitor for speech eval needs if cognition deficits impeding ADLS  CONSULTED AND AGREED WITH PLAN OF CARE: Patient and family member/caregiver  PLAN FOR NEXT SESSION: continue neuro re-education LUE/trunk, look at bathroom set up  for shower transfers   [place], [place] 02/22/2022, 1:18 PM

## 2022-02-21 NOTE — Therapy (Signed)
OUTPATIENT PHYSICAL THERAPY NEURO TREATMENT NOTE       Patient Name: Charlotte Hunter MRN: [id number] DOB:June 27, 1955, 66 y.o., female Today's Date: 02/22/2022   PCP: Charlotte Grand, MD REFERRING PROVIDER: Bayard Hugger, NP    PT End of Session - 02/22/22 1550     Visit Number 16    Number of Visits 29    Date for PT Re-Evaluation 03/29/22    Authorization Type Medicare    Authorization Time Period 01-03-22 - 04-03-22    Progress Note Due on Visit 10    PT Start Time 0850    PT Stop Time 0930    PT Time Calculation (min) 40 min    Equipment Utilized During Treatment Gait belt   pt wearing her AFO:  LBQC used   Activity Tolerance Patient tolerated treatment well    Behavior During Therapy WFL for tasks assessed/performed                       Past Medical History:  Diagnosis Date   A-fib [facility])    Aortic insufficiency    Hypertension    Stroke [facility])    Past Surgical History:  Procedure Laterality Date   ABDOMINAL HYSTERECTOMY     ABLATION  05/03/2021   heart   c section  01/1968   KNEE SURGERY Right    meniscus   SHOULDER SURGERY Right 08/2014   Patient Active Problem List   Diagnosis Date Noted   ICH (intracerebral hemorrhage) ([place]) 10/01/2021   Paroxysmal atrial fibrillation ([place]) 09/29/2021   Stenosis of intracranial vessel 09/29/2021   Essential hypertension 09/29/2021   Blurry vision 09/29/2021   GERD (gastroesophageal reflux disease) 09/29/2021   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 09/25/2021    ONSET DATE: 09-25-21  REFERRING DIAG: I61.9 (ICD-10-CM) - Hemorrhagic stroke ([place])   THERAPY DIAG:  Hemiplegia and 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 left non-dominant side ([name])  Other abnormalities of gait and mobility  Unsteadiness on feet  Rationale for Evaluation and Treatment Rehabilitation  SUBJECTIVE:                                                                                                                                                                                               SUBJECTIVE STATEMENT: Pt reports she walked around the deck yesterday - 20' x 3 - with RW with her friend helping her - is now able to walk more at home but has not tried walking in her bedroom yet on the thick carpet  Pt accompanied by: self and daughter Charlotte Hunter  PERTINENT HISTORY:  66 year old female here for evaluation of right thalamic intracerebral hemorrhage. Patient is was admitted to the hospital in June 2023 for new onset left-sided weakness headache and nausea. She was found to have intracerebral hemorrhage in the right thalamus. Patient was on Eliquis anticoagulation for atrial fibrillation at the time and this was reversed medically. She was started on hypertonic saline, blood pressure control in ICU admission. Patient went to inpatient rehabilitation. Now patient back home. Still has left-sided weakness and left hand and arm and leg pain. Has been to cardiology who recommended to hold anticoagulation for now as patient is not in atrial fibrillation.   Past medical history of atrial fibrillation on Eliquis aortic insufficiency, hypertension, fibromyalgia  PAIN:  Are you having pain? Yes: NPRS scale: 4/10 Pain location: Rt knee -  Pain description: constant, dull, aching Aggravating factors: swelling aggravates it Relieving factors: ice , Voltaren gel     PRECAUTIONS: Fall  WEIGHT BEARING RESTRICTIONS No  FALLS: Has patient fallen in last 6 months? No  LIVING ENVIRONMENT: Lives with: daughter, Charlotte Hunter Lives in: House/apartment Stairs: No - Ramp has been built at front entrance Has following equipment at home: Programmer, multimedia, Environmental consultant - 2 wheeled, and bed side commode  PLOF: Independent; pt did gardening prior to CVA - was not employed prior to onset of CVA  PATIENT GOALS "be restored back to where I was"  OBJECTIVE:  GAIT: Gait pattern: decreased hip/knee  flexion- Left, decreased ankle dorsiflexion- Left, and genu recurvatum- Left Pt wearing her custom AFO on LLE Distance walked: 115'  Assistive device utilized: Quad cane large base Level of assistance: CGA and Min A Comments: Lt foot noted to internally rotate slightly more in today's session compared to that in previous session  Step training; pt negotiated 4 steps using step by step sequence with Rt hand rail; pt c/o Rt knee pain with descension of steps  NEURORE-ED:  Pt performed LLE weight bearing/SLS activity by tapping balance bubble with Rt foot 5 reps with UE support on LBQC with CGA to min assist for balance   TherEx:  pt performed hamstring stretch in standing at counter (runner's stretch) LLE 20 sec hold x 1 rep; reviewed gastroc stretch in standing for LLE with use of 2"-3"  Block or book; pt unable to use bottom shelf of cabinet for stretch with AFO donned on LLE; Lt hamstring stretch in long sitting position 15 sec hold x 1 rep    TherAct:  pt transferred wheelchair to mat toward Lt side at start of session with CGA, using stand pivot transfer       TherEx:  Access Code: [place] - issued on 01-17-22 URL: [email or web address] Date: 01/17/2022 Prepared by: Charlotte Hunter  Exercises - Seated Soleus Stretch  - 2-3 x daily - 7 x weekly - 1 sets - 2 reps - 15-20 secs  hold - Slant Board Gastrocnemius Stretch  - 3 x daily - 7 x weekly - 1 sets - 2 reps - 15-20 secs hold - Seated Gastroc Stretch with Strap  - 2-3 x daily - 7 x weekly - 1 sets - 2 reps - 15-20 hold   PATIENT EDUCATION:  01-17-22 Education details: continue HEP Person educated: Patient Education method: Explanation, Demonstration Education comprehension: verbalized understanding, demonstration   HOME EXERCISE PROGRAM:  Previously issued Access Code: [email or web address] URL: [email or web address] Date: 01/08/2022 Prepared by: Charlotte Hunter  Exercises - Bridge  - 1 x daily - 7 x weekly - 1  sets - 10  reps - 5 hold - Single Leg Bridge  - 1 x daily - 7 x weekly - 1 sets - 10 reps - 3 sec hold - Hooklying Clamshell with Resistance  - 1 x daily - 7 x weekly - 1 sets - 10 reps - 2-3 sec hold - Supine Hip Flexion with Resistance Loop  - 1 x daily - 7 x weekly - 3 sets - 10 reps - Supine Heel Slide  - 1 x daily - 7 x weekly - 3 sets - 10 reps - Stepping out/in with LEFT leg  - 1 x daily - 7 x weekly - 3 sets - 10 reps - Hooklying Hamstring Stretch with Strap  - 1 x daily - 7 x weekly - 1 sets - 1-2 reps - 20 sec hold  GOALS: Goals reviewed with patient? Yes  SHORT TERM GOALS: Target date: 01/31/2022  Pt will perform basic transfers wheelchair to mat with supervision toward Rt side. Baseline: CGA Goal status: Progressing  - 01-28-22: pt requires CGA to SBA  2.  Amb. 115' with RW with CGA with AFO on LLE. Baseline: 10' with RW Goal status: Goal met 01-24-22  3.  Pt will stand for at least 5" with UE support prn with SBA:  pt will be able to reach 6" anteriorly without LOB with SBA for increased safety & independence with ADL's. Baseline:  Goal status: Goal met 01-29-22  4.  Perform bed mobility sit to/from supine with SBA.  Baseline: TBA Goal status: Partially met 01-28-22: pt uses rail on bed on Rt side to assist with supine to sit transfer; needs min assist to transfer supine to sit toward Lt side  5.  Assess Berg balance test and establish LTG as appropriate. Baseline:  score 16/56 (tested on 01-18-22) Goal status: Goal met   6.  Independent in HEP for LLE strengthening. Baseline:  Goal status: Goal met 01-24-22  NEW SHORT TERM GOALS:  TARGET DATE 02-28-22:   Perform basic transfers with supervision from wheelchair to/from mat toward Rt side. Baseline:  CGA to SBA in clinic Goal status:  NEW  2.  Amb. 230' with RW with SBA on flat, even surface to demo improved endurance.  Baseline:  36' with RW with CGA  Goal status:  NEW  3. Pt will report amb. In her bedroom  at home with daughter's assistance with RW.   Baseline:  currently not attempting due to fear of falling  Goal status:  NEW  4.  Improve Berg score to >/= 26/56 to demo improved balance.  Baseline:  16/56 on 01-18-22  Goal status:  NEW  5.  Assess TUG (LTG is set)  Baseline:  2"1 sec with [facility]  Goal status:  Goal met 02-19-22  6.  Amb. 75' with LBQC with min assist on flat, even surface.  Baseline:  45' x 1, 70' x 1 with [facility]  Goal status: Goal met 02-19-22    LONG TERM GOALS: Target date: 03/28/2022  Modified independent household amb. with appropriate assistive device (RW or LBQC).  Baseline:  Goal status: INITIAL  2.  Pt will increase Berg balance test score by at least 12 points to demo improved balance and to reduce fall risk.  Baseline: to be assessed Goal status: INITIAL  3.  Amb. 350' with RW with SBA for increased community accessibility.  Baseline:  Goal status: INITIAL  4.  Negotiate 4 steps with use of handrail with SBA using step by step sequence. Baseline:  unable to perform at eval Goal status: INITIAL  5.  Pt will improve TUG score by at least 15 secs to demo improved functional mobility and reduced fall risk. Baseline:  Goal status: INITIAL  6.  Pt will negotiate ramp and curb with RW with SBA for increased community accessibility.  Baseline:  Goal status: INITIAL  ASSESSMENT:  CLINICAL IMPRESSION: PT session focused on gait training with [facility] and also on step training. Pt able to negotiate 4 steps with use of Rt hand rail with less assistance required during today's session than in previous session when step negotiation was initially performed.  Pt is improving with gait training with use of LBQC with CGA to min assist needed for recovery of LOB.  Continue with POC.    OBJECTIVE IMPAIRMENTS Abnormal gait, decreased activity tolerance, decreased balance, decreased coordination, decreased endurance, decreased knowledge of use of DME, decreased ROM,  decreased strength, impaired sensation, impaired tone, and impaired UE functional use.   ACTIVITY LIMITATIONS carrying, lifting, bending, standing, squatting, stairs, transfers, bed mobility, and locomotion level  PARTICIPATION LIMITATIONS: meal prep, cleaning, laundry, driving, shopping, community activity, and yard work  Kelliher and severity of deficits  are also affecting patient's functional outcome.   REHAB POTENTIAL: Good  CLINICAL DECISION MAKING: Evolving/moderate complexity  EVALUATION COMPLEXITY: Moderate  PLAN: PT FREQUENCY: 3x/week for initial 4 weeks, followed by 2x/week for 8 weeks  PT DURATION: 12 weeks  PLANNED INTERVENTIONS: Therapeutic exercises, Therapeutic activity, Neuromuscular re-education, Balance training, Gait training, Patient/Family education, Self Care, Stair training, Orthotic/Fit training, DME instructions, Aquatic Therapy, and Wheelchair mobility training  PLAN FOR NEXT SESSION:   continue trial gait with LBQC, LLE NMR and knee control   Teneka Mal[place], [place], [place] [address] [place], [place] [address] 02/22/2022, 3:53 PM

## 2022-02-22 ENCOUNTER — Encounter: Payer: Self-pay | Admitting: Physical Therapy

## 2022-02-25 ENCOUNTER — Ambulatory Visit: Payer: Medicare Other | Admitting: Physical Therapy

## 2022-02-25 ENCOUNTER — Ambulatory Visit: Payer: Medicare Other | Admitting: Occupational Therapy

## 2022-02-25 DIAGNOSIS — R2689 Other abnormalities of gait and mobility: Secondary | ICD-10-CM

## 2022-02-25 DIAGNOSIS — R2681 Unsteadiness on feet: Secondary | ICD-10-CM

## 2022-02-25 DIAGNOSIS — I69154 Hemiplegia and 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 left non-dominant side: Secondary | ICD-10-CM

## 2022-02-25 DIAGNOSIS — M6281 Muscle weakness (generalized): Secondary | ICD-10-CM

## 2022-02-25 NOTE — Therapy (Signed)
OUTPATIENT OCCUPATIONAL THERAPY NEURO TREATMENT  Patient Name: Charlotte Hunter MRN: [id number] DOB:08/29/1955, 66 y.o., female Today's Date: 02/25/2022  PCP: Charlotte Grand, MD REFERRING PROVIDER: Bayard Hugger, NP    OT End of Session - 02/25/22 1508     Visit Number 6    Number of Visits 25    Date for OT Re-Evaluation 05/07/22    Authorization Type MCR, BC/BS    Authorization - Visit Number 6    OT Start Time 1315    OT Stop Time 1400    OT Time Calculation (min) 45 min    Activity Tolerance Patient tolerated treatment well    Behavior During Therapy WFL for tasks assessed/performed              Past Medical History:  Diagnosis Date   A-fib ([place])    Aortic insufficiency    Hypertension    Stroke [facility])    Past Surgical History:  Procedure Laterality Date   ABDOMINAL HYSTERECTOMY     ABLATION  05/03/2021   heart   c section  01/1968   KNEE SURGERY Right    meniscus   SHOULDER SURGERY Right 08/2014   Patient Active Problem List   Diagnosis Date Noted   ICH (intracerebral hemorrhage) ([place]) 10/01/2021   Paroxysmal atrial fibrillation ([place]) 09/29/2021   Stenosis of intracranial vessel 09/29/2021   Essential hypertension 09/29/2021   Blurry vision 09/29/2021   GERD (gastroesophageal reflux disease) 09/29/2021   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 Patient was admitted to the hospital in September 25, 2021 for new  onset left-sided weakness headache and nausea. She was found to have intracerebral hemorrhage in the right thalamus. Patient was on Eliquis anticoagulation for atrial fibrillation at the time and this was reversed medically. She was started on hypertonic saline, blood pressure control in ICU admission. Patient went to inpatient rehabilitation. Then pt had HH therapies. Still has left-sided weakness and left hand and arm and leg pain. Has been to cardiology who recommended to hold anticoagulation for now as patient is not in atrial fibrillation.    Past medical history of atrial fibrillation on Eliquis aortic insufficiency, HTN, fibromyalgia  PRECAUTIONS: Fall and Other: NO lifting > 14 lbs, no driving  WEIGHT BEARING RESTRICTIONS: No  PAIN:  Are you having pain? Yes: NPRS scale: 6/10 Pain location: Lt hand Pain description: burning, constant  Aggravating factors: cold weather Relieving factors: meds, voltaren gel  FALLS: Has patient fallen in last 6 months? No  LIVING ENVIRONMENT: Lives with: daughter, Charlotte Hunter Lives in: House/apartment Stairs: No - Ramp has been built at front entrance Has following equipment at home: w/c, quad cane large base, Walker - 2 wheeled, and bed side commode  PLOF: Independent; pt did gardening prior to CVA - was not employed prior to onset of CVA   PATIENT GOALS: get my hand better, return to gardening  OBJECTIVE: from eval  HAND DOMINANCE: Right  ADLs: Eating: dependent for cutting food, mod I eating Grooming: mod I  UB Dressing: min assist w/ sports  bra and getting shirt overhead LB Dressing: min assist w/ Lt shoe (d/t brace) and tying shoes Toileting: uses BSC - min assist for clothes management Bathing: seated - w/ min to assist for back, bottom, and feet Tub Shower transfers: grab bar, ? Tub transfer bench for shower (w/ high thresh hold) - close sup to CGA  IADLs: Shopping: daughter performing now Light housekeeping: daughter performing  now Meal Prep: pt can heat up things in microwave, make sandwich, however daughter cooking Community mobility: relies on daughter or friend for transportation Medication management: does w/ pillbox and strategies Financial management: mod I  Handwriting:  denies change (difficulty stabilizing check w/ Lt hand)   MOBILITY STATUS:  primarily uses w/c, sup using walker shorter distances   UPPER EXTREMITY ROM:  RUE AROM WNL's.  LUE: Pt has approx 60* sh flexion w/ no control and compensations into abd, IR and forearm pronation. Pt has approx 75% ER, 50% IR. Full elbow flex, 75% elbow ext and supination, wrist WFL's, 90% finger flexion    HAND FUNCTION: Grip strength: Right: 68.1 lbs; Left: 14.5 lbs  COORDINATION: Box & Blocks Lt = 7,  9 hole peg unable LUE Pt able to pick up pen Lt hand but unable to manipulate  SENSATION: Light touch: absent except slight detection more proximal hand - unable to localize. Absent in fingertips Proprioception: Impaired    MUSCLE TONE: LUE: dystonia   COGNITION: Overall cognitive status:  inconsistent w/ comprehension of questions during eval, short term memory deficits  VISION: Subjective report: diplopia resolved Baseline vision: Bifocals Visual history:  none  VISION ASSESSMENT: Not tested  Patient has difficulty with following activities due to following visual impairments: Pt reports some blurriness w/ reading     TODAY'S TREATMENT:    Practiced toilet transfers and toileting/clothes management w/ close sup to CGA and min assist to don pants over hips on Lt side. Pt w/ slight LOB in which she quickly recovered when donning pants over hips after toileting.   Discussed shower set up and bathroom set up and simulated shower transfer w/ use of RW. Discussed future adjustments when pt can safely move to a quad cane.         HOME EXERCISE PROGRAM: 02/12/22: safety considerations d/t lack of sensation Lt hand, functional tasks to  perform w/ Lt hand, shoulder HEP, and A/E recommendations (bath mitt, hands free blow dryer holder)   GOALS: Goals reviewed with patient? Yes  SHORT TERM GOALS: Target date: 03/09/22  Independent with initial HEP  Baseline: Goal status: IN PROGRESS  2.  Pt to cut food w/ A/E prn and report greater ease opening peel containers (yogurts, deli meat) Baseline:  Goal status: IN PROGRESS  3.  Pt to improve LUE functional use by demo improvement on Box & Blocks to 13 or more Baseline: 7 blocks Goal status: INITIAL  4.  Pt to perform UB dressing mod I level Baseline:  Goal status: INITIAL  5.  Pt to perform low level functional reaching w/ min compensations to grasp/release 1" objects Baseline:  Goal status: INITIAL  6.  Pt to consistently perform clothes management w/ min assist from LUE w/o LOB Baseline:  Goal status:IN PROGRESS  7.  Pt to verbalize understanding of safety strategies d/t lack of sensation Lt hand Baseline:  Goal status: INITIAL   LONG TERM GOALS: Target date: 05/07/22  Independent w/ updated HEP  Baseline:  Goal status: INITIAL  2.  Pt to improve LUE function as evidenced  by performing Box & Blocks to 18 or more Baseline: 7 blocks Goal status: INITIAL  3.  Pt to improve [place] hand as evidenced by performing 9 hole peg test in under 2 min Baseline: unable Goal status: INITIAL  4.  Pt to be mod I for all ADLS (except sup for shower transfers)  Baseline:  Goal status: INITIAL  5.  Pt to perform 70* sh flexion for functional light reaching with min compensations LUE Baseline:  Goal status: INITIAL  6.  Grip strength Lt hand to be 20 lbs or greater Baseline: 14.5 lbs Goal status: INITIAL  7.  Pt will be mod I for gardening task w/ DME/AE prn Baseline:  Goal status: INITIAL   ASSESSMENT:  CLINICAL IMPRESSION: Pt progressing towards short term goals. She is limited by sensory deficits.  PERFORMANCE DEFICITS: in functional skills including  ADLs, IADLs, coordination, dexterity, proprioception, sensation, tone, ROM, strength, pain, Fine motor control, Gross motor control, mobility, balance, endurance, decreased knowledge of use of DME, and UE functional use, cognitive skills including memory, problem solving, and safety awareness.   IMPAIRMENTS: are limiting patient from ADLs, IADLs, leisure, and social participation.   CO-MORBIDITIES; may have co-morbidities  that affects occupational performance. Patient will benefit from skilled OT to address above impairments and improve overall function.  MODIFICATION OR ASSISTANCE TO COMPLETE EVALUATION: Min-Moderate modification of tasks or assist with assess necessary to complete an evaluation.  OT OCCUPATIONAL PROFILE AND HISTORY: Problem focused assessment: Including review of records relating to presenting problem.  CLINICAL DECISION MAKING: Moderate - several treatment options, min-mod task modification necessary  REHAB POTENTIAL: Good  EVALUATION COMPLEXITY: Low    PLAN:  OT FREQUENCY: 2x/week  OT DURATION: 12 weeks  PLANNED INTERVENTIONS: self care/ADL training, therapeutic exercise, therapeutic activity, neuromuscular re-education, passive range of motion, functional mobility training, aquatic therapy, splinting, fluidotherapy, moist heat, patient/family education, cognitive remediation/compensation, visual/perceptual remediation/compensation, coping strategies training, and DME and/or AE instructions  RECOMMENDED OTHER SERVICES: will monitor for speech eval needs if cognition deficits impeding ADLS  CONSULTED AND AGREED WITH PLAN OF CARE: Patient and family member/caregiver  PLAN FOR NEXT SESSION: continue neuro re-education LUE/trunk   Redmond Baseman, OTR/L 02/25/22 3:09 PM Phone [phone] FAX [phone]

## 2022-02-25 NOTE — Therapy (Signed)
OUTPATIENT PHYSICAL THERAPY NEURO TREATMENT NOTE       Patient Name: Charlotte Hunter MRN: [id number] DOB:29-May-1955, 66 y.o., female Today's Date: 02/25/2022   PCP: Su Grand, MD REFERRING PROVIDER: Bayard Hugger, NP    PT End of Session - 02/25/22 1447     Visit Number 17    Number of Visits 29    Date for PT Re-Evaluation 03/29/22    Authorization Type Medicare    Authorization Time Period 01-03-22 - 04-03-22    Progress Note Due on Visit 10    PT Start Time 1445    PT Stop Time 1530    PT Time Calculation (min) 45 min    Equipment Utilized During Treatment Gait belt   pt wearing her AFO:  LBQC used   Activity Tolerance Patient tolerated treatment well    Behavior During Therapy WFL for tasks assessed/performed                        Past Medical History:  Diagnosis Date   A-fib [facility])    Aortic insufficiency    Hypertension    Stroke [facility])    Past Surgical History:  Procedure Laterality Date   ABDOMINAL HYSTERECTOMY     ABLATION  05/03/2021   heart   c section  01/1968   KNEE SURGERY Right    meniscus   SHOULDER SURGERY Right 08/2014   Patient Active Problem List   Diagnosis Date Noted   ICH (intracerebral hemorrhage) ([place]) 10/01/2021   Paroxysmal atrial fibrillation ([place]) 09/29/2021   Stenosis of intracranial vessel 09/29/2021   Essential hypertension 09/29/2021   Blurry vision 09/29/2021   GERD (gastroesophageal reflux disease) 09/29/2021   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 09/25/2021    ONSET DATE: 09-25-21  REFERRING DIAG: I61.9 (ICD-10-CM) - Hemorrhagic stroke ([place])   THERAPY DIAG:  Hemiplegia and 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 left non-dominant side ([name])  Other abnormalities of gait and mobility  Unsteadiness on feet  Muscle weakness (generalized)  Rationale for Evaluation and Treatment Rehabilitation  SUBJECTIVE:                                                                                                                                                                                               SUBJECTIVE STATEMENT: Pt reports she has been working on gait at home with her RW and with her SBQC. Pt reports she did get a shot to her R knee but she felt the effects were different this time--she reports it made her sleepy and her pain is not as  well controlled.  Pt accompanied by: self and daughter [name]  PERTINENT HISTORY: 66 year old female here for evaluation of right thalamic intracerebral hemorrhage. Patient is was admitted to the hospital in June 2023 for new onset left-sided weakness headache and nausea. She was found to have intracerebral hemorrhage in the right thalamus. Patient was on Eliquis anticoagulation for atrial fibrillation at the time and this was reversed medically. She was started on hypertonic saline, blood pressure control in ICU admission. Patient went to inpatient rehabilitation. Now patient back home. Still has left-sided weakness and left hand and arm and leg pain. Has been to cardiology who recommended to hold anticoagulation for now as patient is not in atrial fibrillation.   Past medical history of atrial fibrillation on Eliquis aortic insufficiency, hypertension, fibromyalgia  PAIN:  Are you having pain? Yes: NPRS scale: 8/10 Pain location: Rt knee -  Pain description: constant, dull, aching Aggravating factors: swelling aggravates it Relieving factors: ice , Voltaren gel    PRECAUTIONS: Fall  WEIGHT BEARING RESTRICTIONS No  FALLS: Has patient fallen in last 6 months? No  LIVING ENVIRONMENT: Lives with: daughter, [name] Lives in: House/apartment Stairs: No - Ramp has been built at front entrance Has following equipment at home: Programmer, multimedia, Environmental consultant - 2 wheeled, and bed side commode  PLOF: Independent; pt did gardening prior to CVA - was not employed prior to onset of CVA  PATIENT GOALS "be  restored back to where I was"  OBJECTIVE:  GAIT: Gait pattern: decreased step length- Right, decreased stance time- Left, decreased hip/knee flexion- Left, and genu recurvatum- Left Distance walked: 40 ft x 5 reps Assistive device utilized: Quad cane small base vs LBQC Level of assistance: CGA Comments: trial gait with LBQC vs pt's SBQC; pt does not feel as secure with her SBQC but exhibits good overall balance with this device; recommended pt continue to work on gait at home with her [facility] to improve her confidence; pt also benefits from wearing a knee sleeve on her R knee due to pain; prefers to initiate gait leading with her RLE first  Pt and her daughter asking about gait across carpet that pt has at home. Continue to recommend that pt try taking a few steps across the carpet, while wearing her tennis shoes and L AFO.  NMR: Standing forward/backward dot tap with RLE with SBQC and CGA for balance, mod cueing at L knee for control to prevent hyperextension, 2 x 10 reps      TherEx:  Access Code: [email or web address] - issued on 01-17-22 URL: [email or web address] Date: 01/17/2022 Prepared by: Ethelene Browns  Exercises - Seated Soleus Stretch  - 2-3 x daily - 7 x weekly - 1 sets - 2 reps - 15-20 secs  hold - Slant Board Gastrocnemius Stretch  - 3 x daily - 7 x weekly - 1 sets - 2 reps - 15-20 secs hold - Seated Gastroc Stretch with Strap  - 2-3 x daily - 7 x weekly - 1 sets - 2 reps - 15-20 hold   PATIENT EDUCATION:  01-17-22 Education details: continue HEP Person educated: Patient Education method: Explanation, Demonstration Education comprehension: verbalized understanding, demonstration   HOME EXERCISE PROGRAM:  Previously issued Access Code: [email or web address] URL: [email or web address] Date: 01/08/2022 Prepared by: Ethelene Browns  Exercises - Bridge  - 1 x daily - 7 x weekly - 1 sets - 10 reps - 5 hold - Single Leg Bridge  - 1 x daily - 7 x weekly - 1 sets - 10  reps  - 3 sec hold - Hooklying Clamshell with Resistance  - 1 x daily - 7 x weekly - 1 sets - 10 reps - 2-3 sec hold - Supine Hip Flexion with Resistance Loop  - 1 x daily - 7 x weekly - 3 sets - 10 reps - Supine Heel Slide  - 1 x daily - 7 x weekly - 3 sets - 10 reps - Stepping out/in with LEFT leg  - 1 x daily - 7 x weekly - 3 sets - 10 reps - Hooklying Hamstring Stretch with Strap  - 1 x daily - 7 x weekly - 1 sets - 1-2 reps - 20 sec hold  GOALS: Goals reviewed with patient? Yes  SHORT TERM GOALS: Target date: 01/31/2022  Pt will perform basic transfers wheelchair to mat with supervision toward Rt side. Baseline: CGA Goal status: Progressing  - 01-28-22: pt requires CGA to SBA  2.  Amb. 115' with RW with CGA with AFO on LLE. Baseline: 53' with RW Goal status: Goal met 01-24-22  3.  Pt will stand for at least 5" with UE support prn with SBA:  pt will be able to reach 6" anteriorly without LOB with SBA for increased safety & independence with ADL's. Baseline:  Goal status: Goal met 01-29-22  4.  Perform bed mobility sit to/from supine with SBA.  Baseline: TBA Goal status: Partially met 01-28-22: pt uses rail on bed on Rt side to assist with supine to sit transfer; needs min assist to transfer supine to sit toward Lt side  5.  Assess Berg balance test and establish LTG as appropriate. Baseline:  score 16/56 (tested on 01-18-22) Goal status: Goal met   6.  Independent in HEP for LLE strengthening. Baseline:  Goal status: Goal met 01-24-22  NEW SHORT TERM GOALS:  TARGET DATE 02-28-22:   Perform basic transfers with supervision from wheelchair to/from mat toward Rt side. Baseline:  CGA to SBA in clinic Goal status:  NEW  2.  Amb. 230' with RW with SBA on flat, even surface to demo improved endurance.  Baseline:  7' with RW with CGA  Goal status:  NEW  3. Pt will report amb. In her bedroom at home with daughter's assistance with RW.   Baseline:  currently not attempting due to  fear of falling  Goal status:  NEW  4.  Improve Berg score to >/= 26/56 to demo improved balance.  Baseline:  16/56 on 01-18-22  Goal status:  NEW  5.  Assess TUG (LTG is set)  Baseline:  2"1 sec with [facility]  Goal status:  Goal met 02-19-22  6.  Amb. 35' with LBQC with min assist on flat, even surface.  Baseline:  45' x 1, 70' x 1 with [facility]  Goal status: Goal met 02-19-22    LONG TERM GOALS: Target date: 03/28/2022  Modified independent household amb. with appropriate assistive device (RW or LBQC).  Baseline:  Goal status: INITIAL  2.  Pt will increase Berg balance test score by at least 12 points to demo improved balance and to reduce fall risk.  Baseline: to be assessed Goal status: INITIAL  3.  Amb. 350' with RW with SBA for increased community accessibility.  Baseline:  Goal status: INITIAL  4.  Negotiate 4 steps with use of handrail with SBA using step by step sequence. Baseline: unable to perform at eval Goal status: INITIAL  5.  Pt will improve TUG score by at least 15 secs  to demo improved functional mobility and reduced fall risk. Baseline:  Goal status: INITIAL  6.  Pt will negotiate ramp and curb with RW with SBA for increased community accessibility.  Baseline:  Goal status: INITIAL  ASSESSMENT:  CLINICAL IMPRESSION: Emphasis of skilled PT session on continuing to work on gait with LRAD and on LLE NMR. Pt continues to be limited at times by her anxiety and needs increased encouragement to continue to push herself to progress with therapy. Pt is able to ambulate with [facility] and CGA this session for multiple repetitions, encouraged to continue this practice at home. Pt does continue to exhibit decreased LLE strength leading to some gait deviations. Pt continues to benefit from skilled therapy services to increase safety and independence with functional mobility. Continue POC.     OBJECTIVE IMPAIRMENTS Abnormal gait, decreased activity tolerance, decreased  balance, decreased coordination, decreased endurance, decreased knowledge of use of DME, decreased ROM, decreased strength, impaired sensation, impaired tone, and impaired UE functional use.   ACTIVITY LIMITATIONS carrying, lifting, bending, standing, squatting, stairs, transfers, bed mobility, and locomotion level  PARTICIPATION LIMITATIONS: meal prep, cleaning, laundry, driving, shopping, community activity, and yard work  [place] and severity of deficits  are also affecting patient's functional outcome.   REHAB POTENTIAL: Good  CLINICAL DECISION MAKING: Evolving/moderate complexity  EVALUATION COMPLEXITY: Moderate  PLAN: PT FREQUENCY: 3x/week for initial 4 weeks, followed by 2x/week for 8 weeks  PT DURATION: 12 weeks  PLANNED INTERVENTIONS: Therapeutic exercises, Therapeutic activity, Neuromuscular re-education, Balance training, Gait training, Patient/Family education, Self Care, Stair training, Orthotic/Fit training, DME instructions, Aquatic Therapy, and Wheelchair mobility training  PLAN FOR NEXT SESSION:   continue trial gait with LBQC, LLE NMR and knee control   Excell Seltzer, PT, DPT, [place], [place] [address] 02/25/2022, 3:31 PM

## 2022-02-27 ENCOUNTER — Other Ambulatory Visit: Payer: Self-pay | Admitting: Physical Medicine & Rehabilitation

## 2022-02-27 ENCOUNTER — Other Ambulatory Visit: Payer: Self-pay | Admitting: Registered Nurse

## 2022-02-28 ENCOUNTER — Ambulatory Visit: Payer: Medicare Other | Admitting: Physical Therapy

## 2022-02-28 ENCOUNTER — Encounter: Payer: Self-pay | Admitting: Physical Therapy

## 2022-02-28 ENCOUNTER — Ambulatory Visit: Payer: Medicare Other | Admitting: Occupational Therapy

## 2022-02-28 DIAGNOSIS — R278 Other lack of coordination: Secondary | ICD-10-CM

## 2022-02-28 DIAGNOSIS — R2681 Unsteadiness on feet: Secondary | ICD-10-CM

## 2022-02-28 DIAGNOSIS — R208 Other disturbances of skin sensation: Secondary | ICD-10-CM

## 2022-02-28 DIAGNOSIS — R29818 Other symptoms and signs involving the nervous system: Secondary | ICD-10-CM

## 2022-02-28 DIAGNOSIS — M6281 Muscle weakness (generalized): Secondary | ICD-10-CM

## 2022-02-28 DIAGNOSIS — I69154 Hemiplegia and 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 left non-dominant side: Secondary | ICD-10-CM

## 2022-02-28 DIAGNOSIS — I69318 Other symptoms and signs involving cognitive functions following cerebral infarction: Secondary | ICD-10-CM

## 2022-02-28 DIAGNOSIS — R2689 Other abnormalities of gait and mobility: Secondary | ICD-10-CM

## 2022-02-28 NOTE — Therapy (Signed)
OUTPATIENT PHYSICAL THERAPY NEURO TREATMENT NOTE       Patient Name: Charlotte Hunter MRN: [id number] DOB:22-Jan-1956, 66 y.o., female Today's Date: 02/28/2022   PCP: Su Grand, MD REFERRING PROVIDER: Bayard Hugger, NP    PT End of Session - 02/28/22 2015     Visit Number 18    Number of Visits 29    Date for PT Re-Evaluation 03/29/22    Authorization Type Medicare    Authorization Time Period 01-03-22 - 04-03-22    Progress Note Due on Visit 10    PT Start Time 1150    PT Stop Time 1236    PT Time Calculation (min) 46 min    Equipment Utilized During Treatment Gait belt;Other (comment)   pt wearing her AFO:  LBQC used   Activity Tolerance Patient tolerated treatment well    Behavior During Therapy WFL for tasks assessed/performed                         Past Medical History:  Diagnosis Date   A-fib [facility])    Aortic insufficiency    Hypertension    Stroke [facility])    Past Surgical History:  Procedure Laterality Date   ABDOMINAL HYSTERECTOMY     ABLATION  05/03/2021   heart   c section  01/1968   KNEE SURGERY Right    meniscus   SHOULDER SURGERY Right 08/2014   Patient Active Problem List   Diagnosis Date Noted   ICH (intracerebral hemorrhage) ([place]) 10/01/2021   Paroxysmal atrial fibrillation ([place]) 09/29/2021   Stenosis of intracranial vessel 09/29/2021   Essential hypertension 09/29/2021   Blurry vision 09/29/2021   GERD (gastroesophageal reflux disease) 09/29/2021   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 09/25/2021    ONSET DATE: 09-25-21  REFERRING DIAG: I61.9 (ICD-10-CM) - Hemorrhagic stroke ([place])   THERAPY DIAG:  Hemiplegia and 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 left non-dominant side ([name])  Other abnormalities of gait and mobility  Unsteadiness on feet  Rationale for Evaluation and Treatment Rehabilitation  SUBJECTIVE:                                                                                                                                                                                               SUBJECTIVE STATEMENT: Pt reports her friend came yesterday and walked with her - took a few steps with her walker over the threshold in the kitchen and also took a few steps on the deck; pt reports she is wearing compression knee sleeve on RLE which is helping with the pain  Pt accompanied by: self and daughter Charlotte Hunter  PERTINENT HISTORY: 66 year old female here for evaluation of right thalamic intracerebral hemorrhage. Patient is was admitted to the hospital in June 2023 for new onset left-sided weakness headache and nausea. She was found to have intracerebral hemorrhage in the right thalamus. Patient was on Eliquis anticoagulation for atrial fibrillation at the time and this was reversed medically. She was started on hypertonic saline, blood pressure control in ICU admission. Patient went to inpatient rehabilitation. Now patient back home. Still has left-sided weakness and left hand and arm and leg pain. Has been to cardiology who recommended to hold anticoagulation for now as patient is not in atrial fibrillation.   Past medical history of atrial fibrillation on Eliquis aortic insufficiency, hypertension, fibromyalgia  PAIN:  Are you having pain? No pain in Rt knee today; pt does report pain in Lt hand (nerve pain) Intensity: 3-4/10 Nerve  pain, occasional shock sensation    PRECAUTIONS: Fall  WEIGHT BEARING RESTRICTIONS No  FALLS: Has patient fallen in last 6 months? No  LIVING ENVIRONMENT: Lives with: daughter, Charlotte Hunter Lives in: House/apartment Stairs: No - Ramp has been built at front entrance Has following equipment at home: Programmer, multimedia, Environmental consultant - 2 wheeled, and bed side commode  PLOF: Independent; pt did gardening prior to CVA - was not employed prior to onset of CVA  PATIENT GOALS "be restored back to where I  was"  OBJECTIVE:  GAIT: Gait pattern: decreased step length- Right, decreased stance time- Left, decreased hip/knee flexion- Left, and genu recurvatum- Left Distance walked: 5' with RW:  115' with [facility]  2nd rep Assistive device utilized: RW and LBQC Level of assistance: CGA Comments: pt requested to use RW on first rep gait training for warm up     NMR:  [facility] PT Assessment - 02/28/22 1207       Berg Balance Test   Sit to Stand Able to stand  independently using hands    Standing Unsupported Able to stand 2 minutes with supervision    Sitting with Back Unsupported but Feet Supported on Floor or Stool Able to sit safely and securely 2 minutes    Stand to Sit Controls descent by using hands    Transfers Able to transfer safely, definite need of hands    Standing Unsupported with Eyes Closed Able to stand 3 seconds   CGA for safety   Standing Unsupported with Feet Together Needs help to attain position but able to stand for 30 seconds with feet together    From Standing, Reach Forward with Outstretched Arm Can reach forward >12 cm safely (5")    From Standing Position, Pick up Object from [place] to pick up shoe, needs supervision    From Standing Position, Turn to Look Behind Over each Shoulder Turn sideways only but maintains balance    Turn 360 Degrees Needs assistance while turning    Standing Unsupported, Alternately Place Feet on Step/Stool Able to complete >2 steps/needs minimal assist    Standing Unsupported, One Foot in Front Able to take small step independently and hold 30 seconds    Standing on One Leg Tries to lift leg/unable to hold 3 seconds but remains standing independently    Total Score 31                  TherEx:  Access Code: [email or web address] - issued on 01-17-22 URL: [email or web address] Date: 01/17/2022 Prepared by: Ethelene Browns  Exercises - Seated Soleus Stretch  -  2-3 x daily - 7 x weekly - 1 sets - 2 reps - 15-20 secs  hold - Slant  Board Gastrocnemius Stretch  - 3 x daily - 7 x weekly - 1 sets - 2 reps - 15-20 secs hold - Seated Gastroc Stretch with Strap  - 2-3 x daily - 7 x weekly - 1 sets - 2 reps - 15-20 hold   PATIENT EDUCATION:  01-17-22 Education details: continue HEP Person educated: Patient Education method: Explanation, Demonstration Education comprehension: verbalized understanding, demonstration   HOME EXERCISE PROGRAM:  Previously issued Access Code: [email or web address] URL: [email or web address] Date: 01/08/2022 Prepared by: Ethelene Browns  Exercises - Bridge  - 1 x daily - 7 x weekly - 1 sets - 10 reps - 5 hold - Single Leg Bridge  - 1 x daily - 7 x weekly - 1 sets - 10 reps - 3 sec hold - Hooklying Clamshell with Resistance  - 1 x daily - 7 x weekly - 1 sets - 10 reps - 2-3 sec hold - Supine Hip Flexion with Resistance Loop  - 1 x daily - 7 x weekly - 3 sets - 10 reps - Supine Heel Slide  - 1 x daily - 7 x weekly - 3 sets - 10 reps - Stepping out/in with LEFT leg  - 1 x daily - 7 x weekly - 3 sets - 10 reps - Hooklying Hamstring Stretch with Strap  - 1 x daily - 7 x weekly - 1 sets - 1-2 reps - 20 sec hold  GOALS: Goals reviewed with patient? Yes  SHORT TERM GOALS: Target date: 01/31/2022  Pt will perform basic transfers wheelchair to mat with supervision toward Rt side. Baseline: CGA Goal status: Progressing  - 01-28-22: pt requires CGA to SBA  2.  Amb. 115' with RW with CGA with AFO on LLE. Baseline: 52' with RW Goal status: Goal met 01-24-22  3.  Pt will stand for at least 5" with UE support prn with SBA:  pt will be able to reach 6" anteriorly without LOB with SBA for increased safety & independence with ADL's. Baseline:  Goal status: Goal met 01-29-22  4.  Perform bed mobility sit to/from supine with SBA.  Baseline: TBA Goal status: Partially met 01-28-22: pt uses rail on bed on Rt side to assist with supine to sit transfer; needs min assist to transfer supine to sit toward Lt  side  5.  Assess Berg balance test and establish LTG as appropriate. Baseline:  score 16/56 (tested on 01-18-22) Goal status: Goal met   6.  Independent in HEP for LLE strengthening. Baseline:  Goal status: Goal met 01-24-22  NEW SHORT TERM GOALS:  TARGET DATE 02-28-22:   Perform basic transfers with supervision from wheelchair to/from mat toward Rt side. Baseline:  CGA to SBA in clinic Goal status:  Goal met  2.  Amb. 230' with RW with SBA on flat, even surface to demo improved endurance.  Baseline:  43' with RW with CGA:  52' with RW  with CGA  Goal status:  Goal partially met 02-28-22  3. Pt will report amb. In her bedroom at home with daughter's assistance with RW.   Baseline:  currently not attempting due to fear of falling  Goal status:  Pt states she has not attempted this yet but plans to do so this weekend - 02-28-22  4.  Improve Berg score to >/= 26/56 to demo improved balance.  Baseline:  16/56 on 01-18-22; 31/56  on 02-28-22  Goal status: Goal met  5.  Assess TUG (LTG is set)  Baseline:  2"1 sec with [facility]  Goal status:  Goal met 02-19-22  6.  Amb. 28' with LBQC with min assist on flat, even surface.  Baseline:  81' x 1, 40' x 1 with [facility];  115' with LBQC on 02-28-22  Goal status: Goal met 02-19-22    LONG TERM GOALS: Target date: 03/28/2022  Modified independent household amb. with appropriate assistive device (RW or LBQC).  Baseline:  Goal status: INITIAL  2.  Pt will increase Berg balance test score by at least 12 points to demo improved balance and to reduce fall risk.  Baseline: to be assessed Goal status: INITIAL  3.  Amb. 350' with RW with SBA for increased community accessibility.  Baseline:  Goal status: INITIAL  4.  Negotiate 4 steps with use of handrail with SBA using step by step sequence. Baseline: unable to perform at eval Goal status: INITIAL  5.  Pt will improve TUG score by at least 15 secs to demo improved functional mobility and  reduced fall risk. Baseline:  Goal status: INITIAL  6.  Pt will negotiate ramp and curb with RW with SBA for increased community accessibility.  Baseline:  Goal status: INITIAL  ASSESSMENT:  CLINICAL IMPRESSION:   PT session focused on assessment of STG's and gait training with RW initially and then with use of LBQC.  Pt reports she prefers [facility] rather than SBQC due to feeling more secure and confident with the [facility] than she does with the [facility].  Pt has met STG's #1, 4, 5 and 6; STG #2 partially met as pt amb. 115' with [facility], not 230' per stated goal;  STG #3 not met as pt reports she has not yet attempted ambulating in her bedroom at home yet due to thick shag carpet - states she plans to try this activity this weekend.  Continue POC.     OBJECTIVE IMPAIRMENTS Abnormal gait, decreased activity tolerance, decreased balance, decreased coordination, decreased endurance, decreased knowledge of use of DME, decreased ROM, decreased strength, impaired sensation, impaired tone, and impaired UE functional use.   ACTIVITY LIMITATIONS carrying, lifting, bending, standing, squatting, stairs, transfers, bed mobility, and locomotion level  PARTICIPATION LIMITATIONS: meal prep, cleaning, laundry, driving, shopping, community activity, and yard work  [place] and severity of deficits  are also affecting patient's functional outcome.   REHAB POTENTIAL: Good  CLINICAL DECISION MAKING: Evolving/moderate complexity  EVALUATION COMPLEXITY: Moderate  PLAN: PT FREQUENCY: 3x/week for initial 4 weeks, followed by 2x/week for 8 weeks  PT DURATION: 12 weeks  PLANNED INTERVENTIONS: Therapeutic exercises, Therapeutic activity, Neuromuscular re-education, Balance training, Gait training, Patient/Family education, Self Care, Stair training, Orthotic/Fit training, DME instructions, Aquatic Therapy, and Wheelchair mobility training  PLAN FOR NEXT SESSION:   continue gait with LBQC, LLE NMR and  knee control   Violette Morneault, [place], [place] [address] [place], [place] [address] 02/28/2022, 8:18 PM

## 2022-02-28 NOTE — Therapy (Signed)
OUTPATIENT OCCUPATIONAL THERAPY NEURO TREATMENT  Patient Name: Charlotte Hunter MRN: [id number] DOB:Feb 02, 1956, 66 y.o., female Today's Date: 02/28/2022  PCP: Audery Amel, MD REFERRING PROVIDER: Jones Bales, NP    OT End of Session - 02/28/22 1305     Visit Number 7    Number of Visits 25    Date for OT Re-Evaluation 05/07/22    Authorization Type MCR, BC/BS    Authorization - Visit Number 7    Authorization - Number of Visits 10    Progress Note Due on Visit 10    OT Start Time 1105    OT Stop Time 1145    OT Time Calculation (min) 40 min    Activity Tolerance Patient tolerated treatment well    Behavior During Therapy WFL for tasks assessed/performed               Past Medical History:  Diagnosis Date   A-fib ([name])    Aortic insufficiency    Hypertension    Stroke [facility])    Past Surgical History:  Procedure Laterality Date   ABDOMINAL HYSTERECTOMY     ABLATION  05/03/2021   heart   c section  01/1968   KNEE SURGERY Right    meniscus   SHOULDER SURGERY Right 08/2014   Patient Active Problem List   Diagnosis Date Noted   ICH (intracerebral hemorrhage) ([name]) 10/01/2021   Paroxysmal atrial fibrillation ([name]) 09/29/2021   Stenosis of intracranial vessel 09/29/2021   Essential hypertension 09/29/2021   Blurry vision 09/29/2021   GERD (gastroesophageal reflux disease) 09/29/2021   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 09/25/2021    ONSET DATE: 12/14/2021 Referral date  REFERRING DIAG: I61.9 (ICD-10-CM) - Hemorrhagic stroke ([name])   THERAPY DIAG:  Hemiplegia and 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 left non-dominant side ([name])  Muscle weakness (generalized)  Other lack of coordination  Other disturbances of skin sensation  Other symptoms and signs involving cognitive functions following cerebral infarction  Other symptoms and signs involving the nervous system  Rationale for Evaluation  and Treatment Rehabilitation  SUBJECTIVE:   SUBJECTIVE STATEMENT: Pt reports bathroom floor is slippery Pt accompanied by: self   PERTINENT HISTORY: 66 year old female here for evaluation of right thalamic intracerebral hemorrhage. Patient was admitted to the hospital in September 25, 2021 for new onset left-sided weakness headache and nausea. She was found to have intracerebral hemorrhage in the right thalamus. Patient was on Eliquis anticoagulation for atrial fibrillation at the time and this was reversed medically. She was started on hypertonic saline, blood pressure control in ICU admission. Patient went to inpatient rehabilitation. Then pt had HH therapies. Still has left-sided weakness and left hand and arm and leg pain. Has been to cardiology who recommended to hold anticoagulation for now as patient is not in atrial fibrillation.    Past medical history of atrial fibrillation on Eliquis aortic insufficiency, HTN, fibromyalgia  PRECAUTIONS: Fall and Other: NO lifting > 14 lbs, no driving  WEIGHT BEARING RESTRICTIONS: No  PAIN:  Are you having pain? Yes: NPRS scale: 4-6/10 Pain location: Lt hand Pain description: burning, constant  Aggravating factors: cold weather Relieving factors: meds, voltaren gel  FALLS: Has patient fallen in last 6 months? No  LIVING ENVIRONMENT: Lives with: daughter, [name] Lives in: House/apartment Stairs: No - Ramp has been built at front entrance Has following equipment at home: w/c, quad cane large base, Walker - 2 wheeled, and bed side commode  PLOF: Independent; pt did gardening prior  to CVA - was not employed prior to onset of CVA   PATIENT GOALS: get my hand better, return to gardening  OBJECTIVE: from eval  HAND DOMINANCE: Right  ADLs: Eating: dependent for cutting food, mod I eating Grooming: mod I  UB Dressing: min assist w/ sports bra and getting shirt overhead LB Dressing: min assist w/ Lt shoe (d/t brace) and tying shoes Toileting:  uses BSC - min assist for clothes management Bathing: seated - w/ min to assist for back, bottom, and feet Tub Shower transfers: grab bar, ? Tub transfer bench for shower (w/ high thresh hold) - close sup to CGA  IADLs: Shopping: daughter performing now Light housekeeping: daughter performing now Meal Prep: pt can heat up things in microwave, make sandwich, however daughter cooking Community mobility: relies on daughter or friend for transportation Medication management: does w/ pillbox and strategies Financial management: mod I  Handwriting:  denies change (difficulty stabilizing check w/ Lt hand)   MOBILITY STATUS:  primarily uses w/c, sup using walker shorter distances   UPPER EXTREMITY ROM:  RUE AROM WNL's.  LUE: Pt has approx 60* sh flexion w/ no control and compensations into abd, IR and forearm pronation. Pt has approx 75% ER, 50% IR. Full elbow flex, 75% elbow ext and supination, wrist WFL's, 90% finger flexion    HAND FUNCTION: Grip strength: Right: 68.1 lbs; Left: 14.5 lbs  COORDINATION: Box & Blocks Lt = 7,  9 hole peg unable LUE Pt able to pick up pen Lt hand but unable to manipulate  SENSATION: Light touch: absent except slight detection more proximal hand - unable to localize. Absent in fingertips Proprioception: Impaired    MUSCLE TONE: LUE: dystonia   COGNITION: Overall cognitive status:  inconsistent w/ comprehension of questions during eval, short term memory deficits  VISION: Subjective report: diplopia resolved Baseline vision: Bifocals Visual history:  none  VISION ASSESSMENT: Not tested  Patient has difficulty with following activities due to following visual impairments: Pt reports some blurriness w/ reading     TODAY'S TREATMENT:   Closed chain shoulder flexion reach for floor in seated.  Standing for AA/ROM shoulder flexion with UE ranger, mod facilitation and min A for balance, Pt is limited by sensation and proprioception. RUE support  on chair back.  Sitting at table, pt held down paper with LUE while writing with RUE, min v.c Flipping large playing cards with LUE min-mod facilitation, v.c Placing and removing wooden dowel pegs with mod facilitation of LUE         HOME EXERCISE PROGRAM: 02/12/22: safety considerations d/t lack of sensation Lt hand, functional tasks to perform w/ Lt hand, shoulder HEP, and A/E recommendations (bath mitt, hands free blow dryer holder)   GOALS: Goals reviewed with patient? Yes  SHORT TERM GOALS: Target date: 03/09/22  Independent with initial HEP  Baseline: Goal status: IN PROGRESS  2.  Pt to cut food w/ A/E prn and report greater ease opening peel containers (yogurts, deli meat) Baseline:  Goal status: IN PROGRESS  3.  Pt to improve LUE functional use by demo improvement on Box & Blocks to 13 or more Baseline: 7 blocks Goal status: INITIAL  4.  Pt to perform UB dressing mod I level Baseline:  Goal status: INITIAL  5.  Pt to perform low level functional reaching w/ min compensations to grasp/release 1" objects Baseline:  Goal status: INITIAL  6.  Pt to consistently perform clothes management w/ min assist from LUE w/o LOB Baseline:  Goal status:IN PROGRESS  7.  Pt to verbalize understanding of safety strategies d/t lack of sensation Lt hand Baseline:  Goal status: INITIAL   LONG TERM GOALS: Target date: 05/07/22  Independent w/ updated HEP  Baseline:  Goal status: INITIAL  2.  Pt to improve LUE function as evidenced by performing Box & Blocks to 18 or more Baseline: 7 blocks Goal status: INITIAL  3.  Pt to improve [place] hand as evidenced by performing 9 hole peg test in under 2 min Baseline: unable Goal status: INITIAL  4.  Pt to be mod I for all ADLS (except sup for shower transfers)  Baseline:  Goal status: INITIAL  5.  Pt to perform 70* sh flexion for functional light reaching with min compensations LUE Baseline:  Goal status: INITIAL  6.   Grip strength Lt hand to be 20 lbs or greater Baseline: 14.5 lbs Goal status: INITIAL  7.  Pt will be mod I for gardening task w/ DME/AE prn Baseline:  Goal status: INITIAL   ASSESSMENT:  CLINICAL IMPRESSION: Pt progressing towards short term goals. She is limited by sensory deficits.  PERFORMANCE DEFICITS: in functional skills including ADLs, IADLs, coordination, dexterity, proprioception, sensation, tone, ROM, strength, pain, Fine motor control, Gross motor control, mobility, balance, endurance, decreased knowledge of use of DME, and UE functional use, cognitive skills including memory, problem solving, and safety awareness.   IMPAIRMENTS: are limiting patient from ADLs, IADLs, leisure, and social participation.   CO-MORBIDITIES; may have co-morbidities  that affects occupational performance. Patient will benefit from skilled OT to address above impairments and improve overall function.  MODIFICATION OR ASSISTANCE TO COMPLETE EVALUATION: Min-Moderate modification of tasks or assist with assess necessary to complete an evaluation.  OT OCCUPATIONAL PROFILE AND HISTORY: Problem focused assessment: Including review of records relating to presenting problem.  CLINICAL DECISION MAKING: Moderate - several treatment options, min-mod task modification necessary  REHAB POTENTIAL: Good  EVALUATION COMPLEXITY: Low    PLAN:  OT FREQUENCY: 2x/week  OT DURATION: 12 weeks  PLANNED INTERVENTIONS: self care/ADL training, therapeutic exercise, therapeutic activity, neuromuscular re-education, passive range of motion, functional mobility training, aquatic therapy, splinting, fluidotherapy, moist heat, patient/family education, cognitive remediation/compensation, visual/perceptual remediation/compensation, coping strategies training, and DME and/or AE instructions  RECOMMENDED OTHER SERVICES: will monitor for speech eval needs if cognition deficits impeding ADLS  CONSULTED AND AGREED WITH PLAN  OF CARE: Patient and family member/caregiver  PLAN FOR NEXT SESSION: continue neuro re-education [place], OTR/L [phone] Phone: [phone] 1:08 PM 02/28/22

## 2022-03-01 ENCOUNTER — Other Ambulatory Visit: Payer: Self-pay | Admitting: Physical Medicine & Rehabilitation

## 2022-03-01 ENCOUNTER — Other Ambulatory Visit: Payer: Self-pay | Admitting: Registered Nurse

## 2022-03-04 ENCOUNTER — Ambulatory Visit: Payer: Medicare Other | Admitting: Physical Therapy

## 2022-03-04 ENCOUNTER — Ambulatory Visit: Payer: Medicare Other | Admitting: Occupational Therapy

## 2022-03-04 DIAGNOSIS — R2689 Other abnormalities of gait and mobility: Secondary | ICD-10-CM

## 2022-03-04 DIAGNOSIS — M6281 Muscle weakness (generalized): Secondary | ICD-10-CM

## 2022-03-04 DIAGNOSIS — R2681 Unsteadiness on feet: Secondary | ICD-10-CM

## 2022-03-04 DIAGNOSIS — I69154 Hemiplegia and 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 left non-dominant side: Secondary | ICD-10-CM | POA: Diagnosis not present

## 2022-03-04 DIAGNOSIS — R278 Other lack of coordination: Secondary | ICD-10-CM

## 2022-03-04 NOTE — Therapy (Signed)
OUTPATIENT OCCUPATIONAL THERAPY NEURO TREATMENT  Patient Name: Charlotte Hunter MRN: [id number] DOB:[birth year], 66 y.o., female Today's Date: 03/04/2022  PCP: Audery Amel, MD REFERRING PROVIDER: Jones Bales, NP    OT End of Session - 03/04/22 1236     Visit Number 8    Number of Visits 25    Date for OT Re-Evaluation 05/07/22    Authorization Type MCR, BC/BS    Authorization - Visit Number 8    Authorization - Number of Visits 10    Progress Note Due on Visit 10    OT Start Time 1233    OT Stop Time 1315    OT Time Calculation (min) 42 min    Activity Tolerance Patient tolerated treatment well    Behavior During Therapy WFL for tasks assessed/performed               Past Medical History:  Diagnosis Date   A-fib ([name])    Aortic insufficiency    Hypertension    Stroke [facility])    Past Surgical History:  Procedure Laterality Date   ABDOMINAL HYSTERECTOMY     ABLATION  05/03/2021   heart   c section  01/1968   KNEE SURGERY Right    meniscus   SHOULDER SURGERY Right 08/2014   Patient Active Problem List   Diagnosis Date Noted   ICH (intracerebral hemorrhage) ([name]) 10/01/2021   Paroxysmal atrial fibrillation ([name]) 09/29/2021   Stenosis of intracranial vessel 09/29/2021   Essential hypertension 09/29/2021   Blurry vision 09/29/2021   GERD (gastroesophageal reflux disease) 09/29/2021   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 I felt like I was going to pas out yesterday Pt accompanied by: self    PERTINENT HISTORY: 66 year old female here for evaluation of right thalamic intracerebral hemorrhage. Patient was admitted to the hospital in September 25, 2021 for new onset left-sided weakness headache and nausea. She was found to have intracerebral hemorrhage in the right thalamus. Patient was on Eliquis anticoagulation for atrial fibrillation at the time and this was reversed medically. She was started on hypertonic saline, blood pressure control in ICU admission. Patient went to inpatient rehabilitation. Then pt had HH therapies. Still has left-sided weakness and left hand and arm and leg pain. Has been to cardiology who recommended to hold anticoagulation for now as patient is not in atrial fibrillation.    Past medical history of atrial fibrillation on Eliquis aortic insufficiency, HTN, fibromyalgia  PRECAUTIONS: Fall and Other: NO lifting > 14 lbs, no driving  WEIGHT BEARING RESTRICTIONS: No  PAIN:  Are you having pain? Yes: NPRS scale: 4-6/10 Pain location: Lt hand Pain description: burning, constant  Aggravating factors: cold weather Relieving factors: meds, voltaren gel  FALLS: Has patient fallen in last 6 months? No  LIVING ENVIRONMENT: Lives with: daughter, [name] Lives in: House/apartment Stairs: No - Ramp has been built at front entrance Has following equipment at home: w/c, quad cane large base, Walker - 2 wheeled, and bed side commode  PLOF: Independent; pt did gardening prior to CVA - was not employed prior to onset of CVA  PATIENT GOALS: get my hand better, return to gardening  OBJECTIVE: from eval  HAND DOMINANCE: Right  ADLs: Eating: dependent for cutting food, mod I eating Grooming: mod I  UB Dressing: min assist w/ sports bra and getting shirt overhead LB Dressing: min assist w/ Lt shoe (d/t brace) and tying shoes Toileting: uses BSC - min assist for clothes management Bathing: seated - w/ min to assist for back, bottom, and feet Tub Shower transfers: grab  bar, ? Tub transfer bench for shower (w/ high thresh hold) - close sup to CGA  IADLs: Shopping: daughter performing now Light housekeeping: daughter performing now Meal Prep: pt can heat up things in microwave, make sandwich, however daughter cooking Community mobility: relies on daughter or friend for transportation Medication management: does w/ pillbox and strategies Financial management: mod I  Handwriting:  denies change (difficulty stabilizing check w/ Lt hand)   MOBILITY STATUS:  primarily uses w/c, sup using walker shorter distances   UPPER EXTREMITY ROM:  RUE AROM WNL's.  LUE: Pt has approx 60* sh flexion w/ no control and compensations into abd, IR and forearm pronation. Pt has approx 75% ER, 50% IR. Full elbow flex, 75% elbow ext and supination, wrist WFL's, 90% finger flexion    HAND FUNCTION: Grip strength: Right: 68.1 lbs; Left: 14.5 lbs  COORDINATION: Box & Blocks Lt = 7,  9 hole peg unable LUE Pt able to pick up pen Lt hand but unable to manipulate  SENSATION: Light touch: absent except slight detection more proximal hand - unable to localize. Absent in fingertips Proprioception: Impaired    MUSCLE TONE: LUE: dystonia   COGNITION: Overall cognitive status:  inconsistent w/ comprehension of questions during eval, short term memory deficits  VISION: Subjective report: diplopia resolved Baseline vision: Bifocals Visual history:  none  VISION ASSESSMENT: Not tested  Patient has difficulty with following activities due to following visual impairments: Pt reports some blurriness w/ reading     TODAY'S TREATMENT:   Standing at table: A/P wt shifts to increase weight over BUE's w/ support to Lt hand/elbow.  Standing w/ weight over LUE to place pennies in cones w/ RUE w/ cues to activate triceps.   Seated: BUE flexion reaching towards floor holding boomwhacker. Progressed to against gravity AA/ROM along inclined surface.   Flipping large cards over LUE  w/ hand over hand support at wrist from Rt hand. Stacking/unstacking cones LUE with assist from Rt hand supporting Lt forearm           HOME EXERCISE PROGRAM: 02/12/22: safety considerations d/t lack of sensation Lt hand, functional tasks to perform w/ Lt hand, shoulder HEP, and A/E recommendations (bath mitt, hands free blow dryer holder)   GOALS: Goals reviewed with patient? Yes  SHORT TERM GOALS: Target date: 03/09/22  Independent with initial HEP  Baseline: Goal status: IN PROGRESS  2.  Pt to cut food w/ A/E prn and report greater ease opening peel containers (yogurts, deli meat) Baseline:  Goal status: IN PROGRESS  3.  Pt to improve LUE functional use by demo improvement on Box & Blocks to 13 or more Baseline: 7 blocks Goal status: INITIAL  4.  Pt to perform UB dressing mod I level Baseline:  Goal status: INITIAL  5.  Pt to perform low level functional reaching w/ min compensations to grasp/release 1" objects Baseline:  Goal status: INITIAL  6.  Pt to consistently perform clothes management w/ min assist from LUE w/o LOB Baseline:  Goal status:IN PROGRESS  7.  Pt to verbalize understanding of safety strategies d/t lack of sensation Lt hand Baseline:  Goal status: INITIAL   LONG TERM GOALS: Target date: 05/07/22  Independent w/ updated HEP  Baseline:  Goal status: INITIAL  2.  Pt to improve LUE function as evidenced by performing Box & Blocks to 18 or more Baseline: 7 blocks Goal status: INITIAL  3.  Pt to improve [place] hand as evidenced by performing 9 hole peg test in under 2 min Baseline: unable Goal status: INITIAL  4.  Pt to be mod I for all ADLS (except sup for shower transfers)  Baseline:  Goal status: INITIAL  5.  Pt to perform 70* sh flexion for functional light reaching with min compensations LUE Baseline:  Goal status: INITIAL  6.  Grip strength Lt hand to be 20 lbs or greater Baseline: 14.5 lbs Goal status: INITIAL  7.  Pt will  be mod I for gardening task w/ DME/AE prn Baseline:  Goal status: INITIAL   ASSESSMENT:  CLINICAL IMPRESSION: Pt progressing towards short term goals. She is limited by sensory deficits.  PERFORMANCE DEFICITS: in functional skills including ADLs, IADLs, coordination, dexterity, proprioception, sensation, tone, ROM, strength, pain, Fine motor control, Gross motor control, mobility, balance, endurance, decreased knowledge of use of DME, and UE functional use, cognitive skills including memory, problem solving, and safety awareness.   IMPAIRMENTS: are limiting patient from ADLs, IADLs, leisure, and social participation.   CO-MORBIDITIES; may have co-morbidities  that affects occupational performance. Patient will benefit from skilled OT to address above impairments and improve overall function.  MODIFICATION OR ASSISTANCE TO COMPLETE EVALUATION: Min-Moderate modification of tasks or assist with assess necessary to complete an evaluation.  OT OCCUPATIONAL PROFILE AND HISTORY: Problem focused assessment: Including review of records relating to presenting problem.  CLINICAL DECISION MAKING: Moderate - several treatment options, min-mod task modification necessary  REHAB POTENTIAL: Good  EVALUATION COMPLEXITY: Low    PLAN:  OT FREQUENCY: 2x/week  OT DURATION: 12 weeks  PLANNED INTERVENTIONS: self care/ADL training, therapeutic exercise, therapeutic activity, neuromuscular re-education, passive range of motion, functional mobility training, aquatic therapy, splinting, fluidotherapy, moist heat, patient/family education, cognitive remediation/compensation, visual/perceptual remediation/compensation, coping strategies training, and DME and/or AE instructions  RECOMMENDED OTHER SERVICES: will monitor for speech eval needs if cognition deficits impeding ADLS  CONSULTED AND AGREED WITH PLAN OF CARE: Patient and family member/caregiver  PLAN FOR NEXT SESSION: continue neuro re-education  LUE/trunk, Begin assessing STG's   Redmond Baseman, OTR/L 03/04/22 12:37 PM Phone [phone] FAX [phone]

## 2022-03-04 NOTE — Therapy (Signed)
OUTPATIENT PHYSICAL THERAPY NEURO TREATMENT NOTE       Patient Name: Charlotte Hunter MRN: [id number] DOB:1955-08-31, 66 y.o., female Today's Date: 03/04/2022   PCP: Su Grand, MD REFERRING PROVIDER: Bayard Hugger, NP    PT End of Session - 03/04/22 1154     Visit Number 19    Number of Visits 29    Date for PT Re-Evaluation 03/29/22    Authorization Type Medicare    Authorization Time Period 01-03-22 - 04-03-22    Progress Note Due on Visit 20    PT Start Time 1152   pt late   PT Stop Time 1230    PT Time Calculation (min) 38 min    Equipment Utilized During Treatment Gait belt;Other (comment)   pt wearing her AFO:  SBQC and RW used   Activity Tolerance Patient tolerated treatment well    Behavior During Therapy WFL for tasks assessed/performed                          Past Medical History:  Diagnosis Date   A-fib [facility])    Aortic insufficiency    Hypertension    Stroke [facility])    Past Surgical History:  Procedure Laterality Date   ABDOMINAL HYSTERECTOMY     ABLATION  05/03/2021   heart   c section  01/1968   KNEE SURGERY Right    meniscus   SHOULDER SURGERY Right 08/2014   Patient Active Problem List   Diagnosis Date Noted   ICH (intracerebral hemorrhage) ([place]) 10/01/2021   Paroxysmal atrial fibrillation ([place]) 09/29/2021   Stenosis of intracranial vessel 09/29/2021   Essential hypertension 09/29/2021   Blurry vision 09/29/2021   GERD (gastroesophageal reflux disease) 09/29/2021   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 09/25/2021    ONSET DATE: 09-25-21  REFERRING DIAG: I61.9 (ICD-10-CM) - Hemorrhagic stroke ([place])   THERAPY DIAG:  Hemiplegia and 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 left non-dominant side ([name])  Unsteadiness on feet  Other abnormalities of gait and mobility  Muscle weakness (generalized)  Rationale for Evaluation and Treatment Rehabilitation  SUBJECTIVE:                                                                                                                                                                                               SUBJECTIVE STATEMENT: Pt reports she was walking with her RW yesterday and had a near fall but she was able to recover and prevent a fall. Even though she needed a minute to sit and recover after this pt reports she then  found the confidence to continue to walk more with her RW up/down the hallway. Pt also reports she walked into her home and into the bathroom with her [facility] after last visit. Pt reports she has not yet tried walking on the carpet due to being fatigued when she gets home.  Pt accompanied by: self and daughter [name]  PERTINENT HISTORY: 66 year old female here for evaluation of right thalamic intracerebral hemorrhage. Patient is was admitted to the hospital in June 2023 for new onset left-sided weakness headache and nausea. She was found to have intracerebral hemorrhage in the right thalamus. Patient was on Eliquis anticoagulation for atrial fibrillation at the time and this was reversed medically. She was started on hypertonic saline, blood pressure control in ICU admission. Patient went to inpatient rehabilitation. Now patient back home. Still has left-sided weakness and left hand and arm and leg pain. Has been to cardiology who recommended to hold anticoagulation for now as patient is not in atrial fibrillation.   Past medical history of atrial fibrillation on Eliquis aortic insufficiency, hypertension, fibromyalgia  PAIN:  Are you having pain? Yes Location: R knee; just L of midline down her face, chest, and groin Intensity: 7.5/10 Nerve pain, occasional shock sensation, "rope-like sensation"   PRECAUTIONS: Fall  WEIGHT BEARING RESTRICTIONS No  FALLS: Has patient fallen in last 6 months? No  LIVING ENVIRONMENT: Lives with: daughter, [name] Lives in: House/apartment Stairs: No - Ramp has  been built at front entrance Has following equipment at home: Programmer, multimedia, Environmental consultant - 2 wheeled, and bed side commode  PLOF: Independent; pt did gardening prior to CVA - was not employed prior to onset of CVA  PATIENT GOALS "be restored back to where I was"  OBJECTIVE:  GAIT: Gait pattern: decreased step length- Left, decreased hip/knee flexion- Left, decreased ankle dorsiflexion- Left, circumduction- Left, genu recurvatum- Left, and trunk flexed Distance walked: 57.5 x 2 Assistive device utilized: Walker - 2 wheeled Level of assistance: CGA Comments: decreased LLE control, occasional dragging/scooting of L foot, cirdumduction of LLE, head/neck flexed; RLE cramping after short distance covered  Gait pattern: decreased step length- Left, decreased hip/knee flexion- Left, decreased ankle dorsiflexion- Left, circumduction- Left, genu recurvatum- Left, scissoring, and narrow BOS Distance walked: 80 ft x 2 Assistive device utilized: Quad cane small base Level of assistance: CGA Comments: increased gait speed, step-through gait pattern, occasional L hip IR, no cramping in RLE during gait with SBQC vs with RW      TherEx:  Access Code: [email or web address] - issued on 01-17-22 URL: [email or web address] Date: 01/17/2022 Prepared by: Ethelene Browns  Exercises - Seated Soleus Stretch  - 2-3 x daily - 7 x weekly - 1 sets - 2 reps - 15-20 secs  hold - Slant Board Gastrocnemius Stretch  - 3 x daily - 7 x weekly - 1 sets - 2 reps - 15-20 secs hold - Seated Gastroc Stretch with Strap  - 2-3 x daily - 7 x weekly - 1 sets - 2 reps - 15-20 hold   PATIENT EDUCATION:  03-04-22 Education details: continue HEP and working on walking at home Person educated: Patient Education method: Consulting civil engineer, Demonstration Education comprehension: verbalized understanding, demonstration   HOME EXERCISE PROGRAM:  Previously issued Access Code: [email or web address] URL: [email or web address] Date:  01/08/2022 Prepared by: Ethelene Browns  Exercises - Bridge  - 1 x daily - 7 x weekly - 1 sets - 10 reps - 5 hold - Single Leg Bridge  - 1 x daily - 7 x  weekly - 1 sets - 10 reps - 3 sec hold - Hooklying Clamshell with Resistance  - 1 x daily - 7 x weekly - 1 sets - 10 reps - 2-3 sec hold - Supine Hip Flexion with Resistance Loop  - 1 x daily - 7 x weekly - 3 sets - 10 reps - Supine Heel Slide  - 1 x daily - 7 x weekly - 3 sets - 10 reps - Stepping out/in with LEFT leg  - 1 x daily - 7 x weekly - 3 sets - 10 reps - Hooklying Hamstring Stretch with Strap  - 1 x daily - 7 x weekly - 1 sets - 1-2 reps - 20 sec hold  GOALS: Goals reviewed with patient? Yes  SHORT TERM GOALS: Target date: 01/31/2022  Pt will perform basic transfers wheelchair to mat with supervision toward Rt side. Baseline: CGA Goal status: Progressing  - 01-28-22: pt requires CGA to SBA  2.  Amb. 115' with RW with CGA with AFO on LLE. Baseline: 66' with RW Goal status: Goal met 01-24-22  3.  Pt will stand for at least 5" with UE support prn with SBA:  pt will be able to reach 6" anteriorly without LOB with SBA for increased safety & independence with ADL's. Baseline:  Goal status: Goal met 01-29-22  4.  Perform bed mobility sit to/from supine with SBA.  Baseline: TBA Goal status: Partially met 01-28-22: pt uses rail on bed on Rt side to assist with supine to sit transfer; needs min assist to transfer supine to sit toward Lt side  5.  Assess Berg balance test and establish LTG as appropriate. Baseline:  score 16/56 (tested on 01-18-22) Goal status: Goal met   6.  Independent in HEP for LLE strengthening. Baseline:  Goal status: Goal met 01-24-22  NEW SHORT TERM GOALS:  TARGET DATE 02-28-22:   Perform basic transfers with supervision from wheelchair to/from mat toward Rt side. Baseline:  CGA to SBA in clinic Goal status:  Goal met  2.  Amb. 230' with RW with SBA on flat, even surface to demo improved  endurance.  Baseline:  69' with RW with CGA:  29' with RW  with CGA  Goal status:  Goal partially met 02-28-22  3. Pt will report amb. In her bedroom at home with daughter's assistance with RW.   Baseline:  currently not attempting due to fear of falling  Goal status:  Pt states she has not attempted this yet but plans to do so this weekend - 02-28-22  4.  Improve Berg score to >/= 26/56 to demo improved balance.  Baseline:  16/56 on 01-18-22; 31/56 on 02-28-22  Goal status: Goal met  5.  Assess TUG (LTG is set)  Baseline:  2"1 sec with [facility]  Goal status:  Goal met 02-19-22  6.  Amb. 57' with LBQC with min assist on flat, even surface.  Baseline:  74' x 1, 43' x 1 with [facility];  115' with LBQC on 02-28-22  Goal status: Goal met 02-19-22    LONG TERM GOALS: Target date: 03/28/2022  Modified independent household amb. with appropriate assistive device (RW or LBQC).  Baseline:  Goal status: INITIAL  2.  Pt will increase Berg balance test score by at least 12 points to demo improved balance and to reduce fall risk.  Baseline: to be assessed Goal status: INITIAL  3.  Amb. 350' with RW with SBA for increased community accessibility.  Baseline:  Goal  status: INITIAL  4.  Negotiate 4 steps with use of handrail with SBA using step by step sequence. Baseline: unable to perform at eval Goal status: INITIAL  5.  Pt will improve TUG score by at least 15 secs to demo improved functional mobility and reduced fall risk. Baseline:  Goal status: INITIAL  6.  Pt will negotiate ramp and curb with RW with SBA for increased community accessibility.  Baseline:  Goal status: INITIAL  ASSESSMENT:  CLINICAL IMPRESSION: Emphasis of skilled PT session on continuing to work on gait training with LRAD during therapy session. Pt performs initial gait this session with RW due to feeling less secure with [facility] initially. However, with use of RW pt exhibits decreased gait speed and decreased distance  covered as compared to with [facility] this session. Pt exhibits ongoing LLE weakness and gait deviations including ongoing L knee genu recurvatum, occasional scissoring of limb, and decreased hip/knee flexion and ankle DF even with use of AFO. Pt continues to benefit from skilled therapy services to address ongoing gait impairments and balance deficits leading to increased fall risk. Continue POC.   OBJECTIVE IMPAIRMENTS Abnormal gait, decreased activity tolerance, decreased balance, decreased coordination, decreased endurance, decreased knowledge of use of DME, decreased ROM, decreased strength, impaired sensation, impaired tone, and impaired UE functional use.   ACTIVITY LIMITATIONS carrying, lifting, bending, standing, squatting, stairs, transfers, bed mobility, and locomotion level  PARTICIPATION LIMITATIONS: meal prep, cleaning, laundry, driving, shopping, community activity, and yard work  [place] and severity of deficits  are also affecting patient's functional outcome.   REHAB POTENTIAL: Good  CLINICAL DECISION MAKING: Evolving/moderate complexity  EVALUATION COMPLEXITY: Moderate  PLAN: PT FREQUENCY: 3x/week for initial 4 weeks, followed by 2x/week for 8 weeks  PT DURATION: 12 weeks  PLANNED INTERVENTIONS: Therapeutic exercises, Therapeutic activity, Neuromuscular re-education, Balance training, Gait training, Patient/Family education, Self Care, Stair training, Orthotic/Fit training, DME instructions, Aquatic Therapy, and Wheelchair mobility training  PLAN FOR NEXT SESSION:   continue gait with SBQC, LLE NMR and knee control, RLE step taps   Excell Seltzer, PT, DPT, [place] [address] [place], [place] [id number] 03/04/2022, 1:27 PM

## 2022-03-06 ENCOUNTER — Ambulatory Visit: Payer: Medicare Other | Admitting: Occupational Therapy

## 2022-03-06 ENCOUNTER — Ambulatory Visit: Payer: Medicare Other | Admitting: Physical Therapy

## 2022-03-06 DIAGNOSIS — I69154 Hemiplegia and 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 left non-dominant side: Secondary | ICD-10-CM

## 2022-03-06 DIAGNOSIS — R208 Other disturbances of skin sensation: Secondary | ICD-10-CM

## 2022-03-06 DIAGNOSIS — R29818 Other symptoms and signs involving the nervous system: Secondary | ICD-10-CM

## 2022-03-06 DIAGNOSIS — M6281 Muscle weakness (generalized): Secondary | ICD-10-CM

## 2022-03-06 DIAGNOSIS — R2681 Unsteadiness on feet: Secondary | ICD-10-CM

## 2022-03-06 DIAGNOSIS — R2689 Other abnormalities of gait and mobility: Secondary | ICD-10-CM

## 2022-03-06 DIAGNOSIS — I69318 Other symptoms and signs involving cognitive functions following cerebral infarction: Secondary | ICD-10-CM

## 2022-03-06 DIAGNOSIS — R278 Other lack of coordination: Secondary | ICD-10-CM

## 2022-03-06 NOTE — Therapy (Addendum)
OUTPATIENT OCCUPATIONAL THERAPY NEURO TREATMENT  Patient Name: Charlotte Hunter MRN: [id number] DOB:November 15, 1955, 66 y.o., female Today's Date: 03/06/2022  PCP: Audery Amel, MD REFERRING PROVIDER: Jones Bales, NP    OT End of Session - 03/06/22 1232     Visit Number 9    Number of Visits 25    Date for OT Re-Evaluation 05/07/22    Authorization Type MCR, BC/BS    Authorization - Visit Number 8    Authorization - Number of Visits 10    Progress Note Due on Visit 10    OT Start Time 1233    OT Stop Time 1313    OT Time Calculation (min) 40 min               Past Medical History:  Diagnosis Date   A-fib [facility])    Aortic insufficiency    Hypertension    Stroke [facility])    Past Surgical History:  Procedure Laterality Date   ABDOMINAL HYSTERECTOMY     ABLATION  05/03/2021   heart   c section  01/1968   KNEE SURGERY Right    meniscus   SHOULDER SURGERY Right 08/2014   Patient Active Problem List   Diagnosis Date Noted   ICH (intracerebral hemorrhage) ([name]) 10/01/2021   Paroxysmal atrial fibrillation ([name]) 09/29/2021   Stenosis of intracranial vessel 09/29/2021   Essential hypertension 09/29/2021   Blurry vision 09/29/2021   GERD (gastroesophageal reflux disease) 09/29/2021   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 09/25/2021    ONSET DATE: 12/14/2021 Referral date  REFERRING DIAG: I61.9 (ICD-10-CM) - Hemorrhagic stroke ([name])   THERAPY DIAG:  Hemiplegia and 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 left non-dominant side ([name])  Unsteadiness on feet  Other abnormalities of gait and mobility  Muscle weakness (generalized)  Other lack of coordination  Other disturbances of skin sensation  Other symptoms and signs involving cognitive functions following cerebral infarction  Other symptoms and signs involving the nervous system  Rationale for Evaluation and Treatment Rehabilitation  SUBJECTIVE:    SUBJECTIVE STATEMENT: Pt reports a lot of pain in her arm today Pt accompanied by: self   PERTINENT HISTORY: 66 year old female here for evaluation of right thalamic intracerebral hemorrhage. Patient was admitted to the hospital in September 25, 2021 for new onset left-sided weakness headache and nausea. She was found to have intracerebral hemorrhage in the right thalamus. Patient was on Eliquis anticoagulation for atrial fibrillation at the time and this was reversed medically. She was started on hypertonic saline, blood pressure control in ICU admission. Patient went to inpatient rehabilitation. Then pt had HH therapies. Still has left-sided weakness and left hand and arm and leg pain. Has been to cardiology who recommended to hold anticoagulation for now as patient is not in atrial fibrillation.    Past medical history of atrial fibrillation on Eliquis aortic insufficiency, HTN, fibromyalgia  PRECAUTIONS: Fall and Other: NO lifting > 14 lbs, no driving  WEIGHT BEARING RESTRICTIONS: No  PAIN:  Are you having pain? Yes: NPRS scale: 4-6/10 Pain location: Lt hand Pain description: burning, constant  Aggravating factors: cold weather Relieving factors: meds, voltaren gel  FALLS: Has patient fallen in last 6 months? No  LIVING ENVIRONMENT: Lives with: daughter, [name] Lives in: House/apartment Stairs: No - Ramp has been built at front entrance Has following equipment at home: w/c, quad cane large base, Walker - 2 wheeled, and bed side commode  PLOF: Independent; pt did gardening prior to CVA - was  not employed prior to onset of CVA   PATIENT GOALS: get my hand better, return to gardening  OBJECTIVE: from eval  HAND DOMINANCE: Right  ADLs: Eating: dependent for cutting food, mod I eating Grooming: mod I  UB Dressing: min assist w/ sports bra and getting shirt overhead LB Dressing: min assist w/ Lt shoe (d/t brace) and tying shoes Toileting: uses BSC - min assist for clothes  management Bathing: seated - w/ min to assist for back, bottom, and feet Tub Shower transfers: grab bar, ? Tub transfer bench for shower (w/ high thresh hold) - close sup to CGA  IADLs: Shopping: daughter performing now Light housekeeping: daughter performing now Meal Prep: pt can heat up things in microwave, make sandwich, however daughter cooking Community mobility: relies on daughter or friend for transportation Medication management: does w/ pillbox and strategies Financial management: mod I  Handwriting:  denies change (difficulty stabilizing check w/ Lt hand)   MOBILITY STATUS:  primarily uses w/c, sup using walker shorter distances   UPPER EXTREMITY ROM:  RUE AROM WNL's.  LUE: Pt has approx 60* sh flexion w/ no control and compensations into abd, IR and forearm pronation. Pt has approx 75% ER, 50% IR. Full elbow flex, 75% elbow ext and supination, wrist WFL's, 90% finger flexion    HAND FUNCTION: Grip strength: Right: 68.1 lbs; Left: 14.5 lbs  COORDINATION: Box & Blocks Lt = 7,  9 hole peg unable LUE Pt able to pick up pen Lt hand but unable to manipulate  SENSATION: Light touch: absent except slight detection more proximal hand - unable to localize. Absent in fingertips Proprioception: Impaired    MUSCLE TONE: LUE: dystonia   COGNITION: Overall cognitive status:  inconsistent w/ comprehension of questions during eval, short term memory deficits  VISION: Subjective report: diplopia resolved Baseline vision: Bifocals Visual history:  none  VISION ASSESSMENT: Not tested  Patient has difficulty with following activities due to following visual impairments: Pt reports some blurriness w/ reading     TODAY'S TREATMENT:   Standing at table: A/P wt shifts to increase weight over BUE's w/ support to Lt hand/elbow.   Standing to remove large and medium pegs from semicircle, mod difficulty min facilitation/ v.c  Seated: BUE flexion reaching towards floor holding  boomwhacker. Then progressed to against gravity AA/ROM along inclined surface.   Wiping tabletop with LUE for shoulder flexion and circumduction, min v.c  Folding washcloths for increased LUE functional use, mod difficulty,  min facilitation /v.c, pt improved with practice, but fatigues quickly             HOME EXERCISE PROGRAM: 02/12/22: safety considerations d/t lack of sensation Lt hand, functional tasks to perform w/ Lt hand, shoulder HEP, and A/E recommendations (bath mitt, hands free blow dryer holder)   GOALS: Goals reviewed with patient? Yes  SHORT TERM GOALS: Target date: 03/09/22  Independent with initial HEP  Baseline: Goal status: IN PROGRESS  2.  Pt to cut food w/ A/E prn and report greater ease opening peel containers (yogurts, deli meat) Baseline:  Goal status: IN PROGRESS  3.  Pt to improve LUE functional use by demo improvement on Box & Blocks to 13 or more Baseline: 7 blocks Goal status: INITIAL  4.  Pt to perform UB dressing mod I level Baseline:  Goal status: INITIAL  5.  Pt to perform low level functional reaching w/ min compensations to grasp/release 1" objects Baseline:  Goal status: INITIAL  6.  Pt to consistently  perform clothes management w/ min assist from LUE w/o LOB Baseline:  Goal status:IN PROGRESS  7.  Pt to verbalize understanding of safety strategies d/t lack of sensation Lt hand Baseline:  Goal status: INITIAL   LONG TERM GOALS: Target date: 05/07/22  Independent w/ updated HEP  Baseline:  Goal status: INITIAL  2.  Pt to improve LUE function as evidenced by performing Box & Blocks to 18 or more Baseline: 7 blocks Goal status: INITIAL  3.  Pt to improve [place] hand as evidenced by performing 9 hole peg test in under 2 min Baseline: unable Goal status: INITIAL  4.  Pt to be mod I for all ADLS (except sup for shower transfers)  Baseline:  Goal status: INITIAL  5.  Pt to perform 70* sh flexion for functional light  reaching with min compensations LUE Baseline:  Goal status: INITIAL  6.  Grip strength Lt hand to be 20 lbs or greater Baseline: 14.5 lbs Goal status: INITIAL  7.  Pt will be mod I for gardening task w/ DME/AE prn Baseline:  Goal status: INITIAL   ASSESSMENT:  CLINICAL IMPRESSION: Pt progressing towards short term goals. She is limited by sensory deficits.  PERFORMANCE DEFICITS: in functional skills including ADLs, IADLs, coordination, dexterity, proprioception, sensation, tone, ROM, strength, pain, Fine motor control, Gross motor control, mobility, balance, endurance, decreased knowledge of use of DME, and UE functional use, cognitive skills including memory, problem solving, and safety awareness.   IMPAIRMENTS: are limiting patient from ADLs, IADLs, leisure, and social participation.   CO-MORBIDITIES; may have co-morbidities  that affects occupational performance. Patient will benefit from skilled OT to address above impairments and improve overall function.  MODIFICATION OR ASSISTANCE TO COMPLETE EVALUATION: Min-Moderate modification of tasks or assist with assess necessary to complete an evaluation.  OT OCCUPATIONAL PROFILE AND HISTORY: Problem focused assessment: Including review of records relating to presenting problem.  CLINICAL DECISION MAKING: Moderate - several treatment options, min-mod task modification necessary  REHAB POTENTIAL: Good  EVALUATION COMPLEXITY: Low    PLAN:  OT FREQUENCY: 2x/week  OT DURATION: 12 weeks  PLANNED INTERVENTIONS: self care/ADL training, therapeutic exercise, therapeutic activity, neuromuscular re-education, passive range of motion, functional mobility training, aquatic therapy, splinting, fluidotherapy, moist heat, patient/family education, cognitive remediation/compensation, visual/perceptual remediation/compensation, coping strategies training, and DME and/or AE instructions  RECOMMENDED OTHER SERVICES: will monitor for speech  eval needs if cognition deficits impeding ADLS  CONSULTED AND AGREED WITH PLAN OF CARE: Patient and family member/caregiver  PLAN FOR NEXT SESSION: check short term goals next week, continue neuro re-education Harvey, OTR/L [phone] Phone: [phone] 1:30 PM 03/06/22  03/06/22 12:34 PM Phone [phone] FAX [phone]

## 2022-03-06 NOTE — Therapy (Signed)
OUTPATIENT PHYSICAL THERAPY NEURO TREATMENT NOTE-20th VISIT PROGRESS NOTE       Patient Name: Nohelia Valenza MRN: [id number] DOB:01-Jan-1956, 66 y.o., female Today's Date: 03/06/2022   PCP: Su Grand, MD REFERRING PROVIDER: Bayard Hugger, NP   Physical Therapy Progress Note   Dates of Reporting Period:01/28/2022 - 03/06/2022  See Note below for Objective Data and Assessment of Progress/Goals.  Thank you for the referral of this patient. Excell Seltzer, PT, DPT, CSRS    PT End of Session - 03/06/22 1150     Visit Number 20    Number of Visits 29    Date for PT Re-Evaluation 03/29/22    Authorization Type Medicare    Authorization Time Period 01-03-22 - 04-03-22    Progress Note Due on Visit 20    PT Start Time 1150    PT Stop Time 1230    PT Time Calculation (min) 40 min    Equipment Utilized During Treatment Gait belt;Other (comment)   pt wearing her AFO:  SBQC   Activity Tolerance Patient tolerated treatment well    Behavior During Therapy WFL for tasks assessed/performed                           Past Medical History:  Diagnosis Date   A-fib [facility])    Aortic insufficiency    Hypertension    Stroke [facility])    Past Surgical History:  Procedure Laterality Date   ABDOMINAL HYSTERECTOMY     ABLATION  05/03/2021   heart   c section  01/1968   KNEE SURGERY Right    meniscus   SHOULDER SURGERY Right 08/2014   Patient Active Problem List   Diagnosis Date Noted   ICH (intracerebral hemorrhage) ([place]) 10/01/2021   Paroxysmal atrial fibrillation ([place]) 09/29/2021   Stenosis of intracranial vessel 09/29/2021   Essential hypertension 09/29/2021   Blurry vision 09/29/2021   GERD (gastroesophageal reflux disease) 09/29/2021   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 09/25/2021    ONSET DATE: 09-25-21  REFERRING DIAG: I61.9 (ICD-10-CM) - Hemorrhagic stroke ([place])   THERAPY DIAG:  Hemiplegia and hemiparesis following  nontraumatic intracerebral hemorrhage affecting left non-dominant side ([name])  Unsteadiness on feet  Other abnormalities of gait and mobility  Muscle weakness (generalized)  Rationale for Evaluation and Treatment Rehabilitation  SUBJECTIVE:                                                                                                                                                                                              SUBJECTIVE STATEMENT: Pt reports she  is doing okay today, retells story about losing her balance while walking in hallway with her [facility]. Pt reports ongoing "rope-like" pain around her chest area today.  Pt accompanied by: self and daughter [name]  PERTINENT HISTORY: 66 year old female here for evaluation of right thalamic intracerebral hemorrhage. Patient is was admitted to the hospital in June 2023 for new onset left-sided weakness headache and nausea. She was found to have intracerebral hemorrhage in the right thalamus. Patient was on Eliquis anticoagulation for atrial fibrillation at the time and this was reversed medically. She was started on hypertonic saline, blood pressure control in ICU admission. Patient went to inpatient rehabilitation. Now patient back home. Still has left-sided weakness and left hand and arm and leg pain. Has been to cardiology who recommended to hold anticoagulation for now as patient is not in atrial fibrillation.   Past medical history of atrial fibrillation on Eliquis aortic insufficiency, hypertension, fibromyalgia  PAIN:  Are you having pain? Yes Location: R knee; just L of midline down her face, chest, and groin Intensity: 7.5/10 Nerve pain, occasional shock sensation, "rope-like sensation"   PRECAUTIONS: Fall  WEIGHT BEARING RESTRICTIONS No  FALLS: Has patient fallen in last 6 months? No  LIVING ENVIRONMENT: Lives with: daughter, [name] Lives in: House/apartment Stairs: No - Ramp has been built at front entrance Has  following equipment at home: Programmer, multimedia, Environmental consultant - 2 wheeled, and bed side commode  PLOF: Independent; pt did gardening prior to CVA - was not employed prior to onset of CVA  PATIENT GOALS "be restored back to where I was"  OBJECTIVE:  GAIT: Gait pattern: decreased hip/knee flexion- Left, genu recurvatum- Left, scissoring, and narrow BOS Distance walked: 50 ft (from mat table to // bars) Assistive device utilized: Herbalist base Level of assistance: CGA   NMR:  At mat table: Staggered sit to stand with 2" step under RLE Difficulty keeping weight on LLE and keeping limb on floor Staggered sit to stand with RLE forwards with target to keep LE in place  3 x 5 reps with CGA to min A  Mod manual assist to keep L heel on floor for increased WB with transfer   In // bars: Attempted to have perform eccentric 2" step-downs with RLE with focus on L knee control. Pt unable to perform task without hyperextending L knee.     TherEx:  Access Code: [email or web address] - issued on 01-17-22 URL: [email or web address] Date: 01/17/2022 Prepared by: Ethelene Browns  Exercises - Seated Soleus Stretch  - 2-3 x daily - 7 x weekly - 1 sets - 2 reps - 15-20 secs  hold - Slant Board Gastrocnemius Stretch  - 3 x daily - 7 x weekly - 1 sets - 2 reps - 15-20 secs hold - Seated Gastroc Stretch with Strap  - 2-3 x daily - 7 x weekly - 1 sets - 2 reps - 15-20 hold   PATIENT EDUCATION:  03-04-22 Education details: continue HEP and working on walking at home Person educated: Patient Education method: Consulting civil engineer, Demonstration Education comprehension: verbalized understanding, demonstration   HOME EXERCISE PROGRAM:  Previously issued Access Code: [email or web address] URL: [email or web address] Date: 01/08/2022 Prepared by: Ethelene Browns  Exercises - Bridge  - 1 x daily - 7 x weekly - 1 sets - 10 reps - 5 hold - Single Leg Bridge  - 1 x daily - 7 x weekly - 1 sets - 10 reps - 3 sec  hold - Hooklying Clamshell with Resistance  -  1 x daily - 7 x weekly - 1 sets - 10 reps - 2-3 sec hold - Supine Hip Flexion with Resistance Loop  - 1 x daily - 7 x weekly - 3 sets - 10 reps - Supine Heel Slide  - 1 x daily - 7 x weekly - 3 sets - 10 reps - Stepping out/in with LEFT leg  - 1 x daily - 7 x weekly - 3 sets - 10 reps - Hooklying Hamstring Stretch with Strap  - 1 x daily - 7 x weekly - 1 sets - 1-2 reps - 20 sec hold  GOALS: Goals reviewed with patient? Yes  SHORT TERM GOALS: Target date: 01/31/2022  Pt will perform basic transfers wheelchair to mat with supervision toward Rt side. Baseline: CGA Goal status: Progressing  - 01-28-22: pt requires CGA to SBA  2.  Amb. 115' with RW with CGA with AFO on LLE. Baseline: 90' with RW Goal status: Goal met 01-24-22  3.  Pt will stand for at least 5" with UE support prn with SBA:  pt will be able to reach 6" anteriorly without LOB with SBA for increased safety & independence with ADL's. Baseline:  Goal status: Goal met 01-29-22  4.  Perform bed mobility sit to/from supine with SBA.  Baseline: TBA Goal status: Partially met 01-28-22: pt uses rail on bed on Rt side to assist with supine to sit transfer; needs min assist to transfer supine to sit toward Lt side  5.  Assess Berg balance test and establish LTG as appropriate. Baseline:  score 16/56 (tested on 01-18-22) Goal status: Goal met   6.  Independent in HEP for LLE strengthening. Baseline:  Goal status: Goal met 01-24-22  NEW SHORT TERM GOALS:  TARGET DATE 02-28-22:   Perform basic transfers with supervision from wheelchair to/from mat toward Rt side. Baseline:  CGA to SBA in clinic Goal status:  Goal met  2.  Amb. 230' with RW with SBA on flat, even surface to demo improved endurance.  Baseline:  33' with RW with CGA:  4' with RW  with CGA  Goal status:  Goal partially met 02-28-22  3. Pt will report amb. In her bedroom at home with daughter's assistance with RW.    Baseline:  currently not attempting due to fear of falling  Goal status:  Pt states she has not attempted this yet but plans to do so this weekend - 02-28-22  4.  Improve Berg score to >/= 26/56 to demo improved balance.  Baseline:  16/56 on 01-18-22; 31/56 on 02-28-22  Goal status: Goal met  5.  Assess TUG (LTG is set)  Baseline:  2"1 sec with [facility]  Goal status:  Goal met 02-19-22  6.  Amb. 20' with LBQC with min assist on flat, even surface.  Baseline:  32' x 1, 40' x 1 with [facility];  115' with LBQC on 02-28-22  Goal status: Goal met 02-19-22    LONG TERM GOALS: Target date: 03/28/2022  Modified independent household amb. with appropriate assistive device (RW or LBQC).  Baseline:  Goal status: INITIAL  2.  Pt will increase Berg balance test score by at least 12 points to demo improved balance and to reduce fall risk.  Baseline: to be assessed Goal status: INITIAL  3.  Amb. 350' with RW with SBA for increased community accessibility.  Baseline:  Goal status: INITIAL  4.  Negotiate 4 steps with use of handrail with SBA using step by step  sequence. Baseline: unable to perform at eval Goal status: INITIAL  5.  Pt will improve TUG score by at least 15 secs to demo improved functional mobility and reduced fall risk. Baseline:  Goal status: INITIAL  6.  Pt will negotiate ramp and curb with RW with SBA for increased community accessibility.  Baseline:  Goal status: INITIAL  ASSESSMENT:  CLINICAL IMPRESSION: Emphasis of skilled PT session on working on LLE NMR for improved control of knee during gait cycle. Pt continues to exhibit decreased WB on LLE with transfers and decreased L quad control as evidenced by ongoing hyperextension of knee. Pt continues to benefit from skilled therapy services to address these deficits and increase safety and independence with functional mobility. Continue POC.   OBJECTIVE IMPAIRMENTS Abnormal gait, decreased activity tolerance, decreased  balance, decreased coordination, decreased endurance, decreased knowledge of use of DME, decreased ROM, decreased strength, impaired sensation, impaired tone, and impaired UE functional use.   ACTIVITY LIMITATIONS carrying, lifting, bending, standing, squatting, stairs, transfers, bed mobility, and locomotion level  PARTICIPATION LIMITATIONS: meal prep, cleaning, laundry, driving, shopping, community activity, and yard work  [place] and severity of deficits  are also affecting patient's functional outcome.   REHAB POTENTIAL: Good  CLINICAL DECISION MAKING: Evolving/moderate complexity  EVALUATION COMPLEXITY: Moderate  PLAN: PT FREQUENCY: 3x/week for initial 4 weeks, followed by 2x/week for 8 weeks  PT DURATION: 12 weeks  PLANNED INTERVENTIONS: Therapeutic exercises, Therapeutic activity, Neuromuscular re-education, Balance training, Gait training, Patient/Family education, Self Care, Stair training, Orthotic/Fit training, DME instructions, Aquatic Therapy, and Wheelchair mobility training  PLAN FOR NEXT SESSION:   continue gait with SBQC, LLE NMR and knee control   Excell Seltzer, PT, DPT, [address], [place] [address] 03/06/2022, 12:32 PM

## 2022-03-11 ENCOUNTER — Ambulatory Visit: Payer: Medicare Other | Admitting: Physical Therapy

## 2022-03-11 ENCOUNTER — Ambulatory Visit: Payer: Medicare Other | Admitting: Occupational Therapy

## 2022-03-11 ENCOUNTER — Other Ambulatory Visit: Payer: Self-pay

## 2022-03-11 ENCOUNTER — Encounter: Payer: Self-pay | Admitting: Physical Therapy

## 2022-03-11 DIAGNOSIS — I69154 Hemiplegia and 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 left non-dominant side: Secondary | ICD-10-CM

## 2022-03-11 DIAGNOSIS — R278 Other lack of coordination: Secondary | ICD-10-CM

## 2022-03-11 DIAGNOSIS — R2681 Unsteadiness on feet: Secondary | ICD-10-CM

## 2022-03-11 DIAGNOSIS — R2689 Other abnormalities of gait and mobility: Secondary | ICD-10-CM

## 2022-03-11 DIAGNOSIS — R208 Other disturbances of skin sensation: Secondary | ICD-10-CM

## 2022-03-11 DIAGNOSIS — M6281 Muscle weakness (generalized): Secondary | ICD-10-CM

## 2022-03-11 NOTE — Therapy (Signed)
OUTPATIENT PHYSICAL THERAPY NEURO TREATMENT NOTE       Patient Name: Charlotte Hunter MRN: [id number] DOB:[birth year], 66 y.o., female Today's Date: 03/11/2022   PCP: Su Grand, MD REFERRING PROVIDER: Bayard Hugger, NP     PT End of Session - 03/11/22 1916     Visit Number 21    Number of Visits 29    Date for PT Re-Evaluation 03/29/22    Authorization Type Medicare    Authorization Time Period 01-03-22 - 04-03-22    Progress Note Due on Visit 20    PT Start Time 1020    PT Stop Time 1100    PT Time Calculation (min) 40 min    Equipment Utilized During Treatment Gait belt;Other (comment)   pt wearing her AFO:  SBQC   Activity Tolerance Patient tolerated treatment well    Behavior During Therapy WFL for tasks assessed/performed                            Past Medical History:  Diagnosis Date   A-fib [facility])    Aortic insufficiency    Hypertension    Stroke [facility])    Past Surgical History:  Procedure Laterality Date   ABDOMINAL HYSTERECTOMY     ABLATION  05/03/2021   heart   c section  01/1968   KNEE SURGERY Right    meniscus   SHOULDER SURGERY Right 08/2014   Patient Active Problem List   Diagnosis Date Noted   ICH (intracerebral hemorrhage) ([place]) 10/01/2021   Paroxysmal atrial fibrillation ([place]) 09/29/2021   Stenosis of intracranial vessel 09/29/2021   Essential hypertension 09/29/2021   Blurry vision 09/29/2021   GERD (gastroesophageal reflux disease) 09/29/2021   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 09/25/2021    ONSET DATE: 09-25-21  REFERRING DIAG: I61.9 (ICD-10-CM) - Hemorrhagic stroke ([place])   THERAPY DIAG:  Hemiplegia and 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 left non-dominant side ([name])  Other abnormalities of gait and mobility  Unsteadiness on feet  Rationale for Evaluation and Treatment Rehabilitation  SUBJECTIVE:                                                                                                                                                                                               SUBJECTIVE STATEMENT: Daughter reports that pt vacuumed at home from her wheelchair and also walked in her bedroom with RW for first time on Friday; pt states she had increased pain the next day (Saturday) so she didn't walk any at all that day; pt reports she is having  Lt foot pain today  Pt accompanied by: self and daughter Charlotte Hunter  PERTINENT HISTORY: 66 year old female here for evaluation of right thalamic intracerebral hemorrhage. Patient is was admitted to the hospital in June 2023 for new onset left-sided weakness headache and nausea. She was found to have intracerebral hemorrhage in the right thalamus. Patient was on Eliquis anticoagulation for atrial fibrillation at the time and this was reversed medically. She was started on hypertonic saline, blood pressure control in ICU admission. Patient went to inpatient rehabilitation. Now patient back home. Still has left-sided weakness and left hand and arm and leg pain. Has been to cardiology who recommended to hold anticoagulation for now as patient is not in atrial fibrillation.   Past medical history of atrial fibrillation on Eliquis aortic insufficiency, hypertension, fibromyalgia  PAIN:  Are you having pain? Yes Location: Lt side of chin, foot and ankle and Lt buttock  Intensity: 7/10 Nerve pain, occasional shock sensation, "rope-like sensation"   PRECAUTIONS: Fall  WEIGHT BEARING RESTRICTIONS No  FALLS: Has patient fallen in last 6 months? No  LIVING ENVIRONMENT: Lives with: daughter, Charlotte Hunter Lives in: House/apartment Stairs: No - Ramp has been built at front entrance Has following equipment at home: Programmer, multimedia, Environmental consultant - 2 wheeled, and bed side commode  PLOF: Independent; pt did gardening prior to CVA - was not employed prior to onset of CVA  PATIENT GOALS "be restored back to where I  was"  OBJECTIVE:  GAIT: Gait pattern: decreased hip/knee flexion- Left, genu recurvatum- Left, scissoring, and narrow BOS Distance walked:  115' x 1 rep with [facility];  7' with RW;  12' with turn required, 12' back to mat table with RW at end of session Assistive device utilized: Quad cane small base Level of assistance: CGA with SBQC with +1 min assist x 1 occurrence due to mild LOB; SBA with RW with occasional CGA for assessment for independent household amb. With RW   NeuroRe-ed:  Pt performed tap ups to 6" step with RLE 10 reps with min assist with UE support on RW - to fascilitate increased weight bearing on LLE with knee control to minimize/prevent Lt knee hyperextension       TherEx:  Access Code: [email or web address] - issued on 01-17-22 URL: [email or web address] Date: 01/17/2022 Prepared by: Ethelene Browns  Exercises - Seated Soleus Stretch  - 2-3 x daily - 7 x weekly - 1 sets - 2 reps - 15-20 secs  hold - Slant Board Gastrocnemius Stretch  - 3 x daily - 7 x weekly - 1 sets - 2 reps - 15-20 secs hold - Seated Gastroc Stretch with Strap  - 2-3 x daily - 7 x weekly - 1 sets - 2 reps - 15-20 hold   PATIENT EDUCATION:  03-11-22 Education details: discussed aquatic PT and offered pt appt next Wed., 03-20-22; pt states she would like an appt on a Monday due to less crowded at pool compared to Wed.'s Person educated: Patient Education method: Explanation, Demonstration Education comprehension: verbalized understanding, demonstration   HOME EXERCISE PROGRAM:  Previously issued Access Code: [email or web address] URL: [email or web address] Date: 01/08/2022 Prepared by: Ethelene Browns  Exercises - Bridge  - 1 x daily - 7 x weekly - 1 sets - 10 reps - 5 hold - Single Leg Bridge  - 1 x daily - 7 x weekly - 1 sets - 10 reps - 3 sec hold - Hooklying Clamshell with Resistance  - 1 x daily - 7 x weekly - 1 sets -  10 reps - 2-3 sec hold - Supine Hip Flexion with Resistance Loop  - 1  x daily - 7 x weekly - 3 sets - 10 reps - Supine Heel Slide  - 1 x daily - 7 x weekly - 3 sets - 10 reps - Stepping out/in with LEFT leg  - 1 x daily - 7 x weekly - 3 sets - 10 reps - Hooklying Hamstring Stretch with Strap  - 1 x daily - 7 x weekly - 1 sets - 1-2 reps - 20 sec hold  GOALS: Goals reviewed with patient? Yes  SHORT TERM GOALS: Target date: 01/31/2022  Pt will perform basic transfers wheelchair to mat with supervision toward Rt side. Baseline: CGA Goal status: Progressing  - 01-28-22: pt requires CGA to SBA  2.  Amb. 115' with RW with CGA with AFO on LLE. Baseline: 40' with RW Goal status: Goal met 01-24-22  3.  Pt will stand for at least 5" with UE support prn with SBA:  pt will be able to reach 6" anteriorly without LOB with SBA for increased safety & independence with ADL's. Baseline:  Goal status: Goal met 01-29-22  4.  Perform bed mobility sit to/from supine with SBA.  Baseline: TBA Goal status: Partially met 01-28-22: pt uses rail on bed on Rt side to assist with supine to sit transfer; needs min assist to transfer supine to sit toward Lt side  5.  Assess Berg balance test and establish LTG as appropriate. Baseline:  score 16/56 (tested on 01-18-22) Goal status: Goal met   6.  Independent in HEP for LLE strengthening. Baseline:  Goal status: Goal met 01-24-22  NEW SHORT TERM GOALS:  TARGET DATE 02-28-22:   Perform basic transfers with supervision from wheelchair to/from mat toward Rt side. Baseline:  CGA to SBA in clinic Goal status:  Goal met  2.  Amb. 230' with RW with SBA on flat, even surface to demo improved endurance.  Baseline:  47' with RW with CGA:  74' with RW  with CGA  Goal status:  Goal partially met 02-28-22  3. Pt will report amb. In her bedroom at home with daughter's assistance with RW.   Baseline:  currently not attempting due to fear of falling  Goal status:  Pt states she has not attempted this yet but plans to do so this weekend -  02-28-22  4.  Improve Berg score to >/= 26/56 to demo improved balance.  Baseline:  16/56 on 01-18-22; 31/56 on 02-28-22  Goal status: Goal met  5.  Assess TUG (LTG is set)  Baseline:  2"1 sec with [facility]  Goal status:  Goal met 02-19-22  6.  Amb. 22' with LBQC with min assist on flat, even surface.  Baseline:  82' x 1, 55' x 1 with [facility];  115' with LBQC on 02-28-22  Goal status: Goal met 02-19-22    LONG TERM GOALS: Target date: 03/28/2022  Modified independent household amb. with appropriate assistive device (RW or LBQC).  Baseline:  Goal status: INITIAL  2.  Pt will increase Berg balance test score by at least 12 points to demo improved balance and to reduce fall risk.  Baseline: to be assessed Goal status: INITIAL  3.  Amb. 350' with RW with SBA for increased community accessibility.  Baseline:  Goal status: INITIAL  4.  Negotiate 4 steps with use of handrail with SBA using step by step sequence. Baseline: unable to perform at eval Goal status: INITIAL  5.  Pt will improve TUG score by at least 15 secs to demo improved functional mobility and reduced fall risk. Baseline:  Goal status: INITIAL  6.  Pt will negotiate ramp and curb with RW with SBA for increased community accessibility.  Baseline:  Goal status: INITIAL  ASSESSMENT:  CLINICAL IMPRESSION: PT session focused on gait training with use of SBQC on initial rep and then with use of RW to assess safety and ability of pt to amb. Independently short distances in her home environment.  Pt able to amb. With RW without major LOB and did demonstrate ability to recover balance independently with mild unsteadiness.  Pt has met STG #3 as she reported walking in her bedroom with RW with daughter's assistance.  Continue POC.   OBJECTIVE IMPAIRMENTS Abnormal gait, decreased activity tolerance, decreased balance, decreased coordination, decreased endurance, decreased knowledge of use of DME, decreased ROM, decreased strength,  impaired sensation, impaired tone, and impaired UE functional use.   ACTIVITY LIMITATIONS carrying, lifting, bending, standing, squatting, stairs, transfers, bed mobility, and locomotion level  PARTICIPATION LIMITATIONS: meal prep, cleaning, laundry, driving, shopping, community activity, and yard work  [place] and severity of deficits  are also affecting patient's functional outcome.   REHAB POTENTIAL: Good  CLINICAL DECISION MAKING: Evolving/moderate complexity  EVALUATION COMPLEXITY: Moderate  PLAN: PT FREQUENCY: 3x/week for initial 4 weeks, followed by 2x/week for 8 weeks  PT DURATION: 12 weeks  PLANNED INTERVENTIONS: Therapeutic exercises, Therapeutic activity, Neuromuscular re-education, Balance training, Gait training, Patient/Family education, Self Care, Stair training, Orthotic/Fit training, DME instructions, Aquatic Therapy, and Wheelchair mobility training  PLAN FOR NEXT SESSION:   amb. With RW at home? continue gait with SBQC, LLE NMR and knee control   Chett Taniguchi, [place], [place] [address] [place], [place] [address] 03/11/2022, 7:19 PM

## 2022-03-11 NOTE — Therapy (Signed)
OUTPATIENT OCCUPATIONAL THERAPY NEURO TREATMENT  Patient Name: Charlotte Hunter MRN: [id number] DOB:[birth year], 66 y.o., female Today's Date: 03/11/2022  PCP: Su Grand, MD REFERRING PROVIDER: Bayard Hugger, NP    OT End of Session - 03/11/22 1105     Visit Number 10    Number of Visits 25    Date for OT Re-Evaluation 05/07/22    Authorization Type MCR, BC/BS    Progress Note Due on Visit 10    OT Start Time 1105    OT Stop Time 1145    OT Time Calculation (min) 40 min    Activity Tolerance Patient tolerated treatment well    Behavior During Therapy WFL for tasks assessed/performed               Past Medical History:  Diagnosis Date   A-fib ([place])    Aortic insufficiency    Hypertension    Stroke 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 09/25/2021    ONSET DATE: 12/14/2021 Referral date  REFERRING DIAG: I61.9 (ICD-10-CM) - Hemorrhagic stroke ([place])   THERAPY DIAG:  Hemiplegia and 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 left non-dominant side ([name])  Unsteadiness on feet  Muscle weakness (generalized)  Other lack of coordination  Other disturbances of skin sensation  Rationale for Evaluation and Treatment Rehabilitation  SUBJECTIVE:   SUBJECTIVE STATEMENT: Pt reports a lot of pain on Saturday but was better Sunday Pt accompanied by: self   PERTINENT HISTORY: 66 year old female  here for evaluation of right thalamic intracerebral hemorrhage. Patient was admitted to the hospital in September 25, 2021 for new onset left-sided weakness headache and nausea. She was found to have intracerebral hemorrhage in the right thalamus. Patient was on Eliquis anticoagulation for atrial fibrillation at the time and this was reversed medically. She was started on hypertonic saline, blood pressure control in ICU admission. Patient went to inpatient rehabilitation. Then pt had HH therapies. Still has left-sided weakness and left hand and arm and leg pain. Has been to cardiology who recommended to hold anticoagulation for now as patient is not in atrial fibrillation.    Past medical history of atrial fibrillation on Eliquis aortic insufficiency, HTN, fibromyalgia  PRECAUTIONS: Fall and Other: NO lifting > 14 lbs, no driving  WEIGHT BEARING RESTRICTIONS: No  PAIN:  Are you having pain? Yes: NPRS scale: 8/10 Pain location: Lt hand (ulnar side) Pain description: burning, constant  Aggravating factors: cold weather Relieving factors: meds, voltaren gel  FALLS: Has patient fallen in last 6 months? No  LIVING ENVIRONMENT: Lives with: daughter, [name] Lives in: House/apartment Stairs: No - Ramp has been built at front entrance Has following equipment at home: w/c, quad cane large base, Walker - 2 wheeled, and bed side commode  PLOF: Independent; pt did gardening prior to CVA - was not employed prior to onset of CVA   PATIENT GOALS: get my hand better, return to gardening  OBJECTIVE: from  eval  HAND DOMINANCE: Right  ADLs: Eating: dependent for cutting food, mod I eating Grooming: mod I  UB Dressing: min assist w/ sports bra and getting shirt overhead LB Dressing: min assist w/ Lt shoe (d/t brace) and tying shoes Toileting: uses BSC - min assist for clothes management Bathing: seated - w/ min to assist for back, bottom, and feet Tub Shower transfers: grab bar, ? Tub transfer bench for  shower (w/ high thresh hold) - close sup to CGA  IADLs: Shopping: daughter performing now Light housekeeping: daughter performing now Meal Prep: pt can heat up things in microwave, make sandwich, however daughter cooking Community mobility: relies on daughter or friend for transportation Medication management: does w/ pillbox and strategies Financial management: mod I  Handwriting:  denies change (difficulty stabilizing check w/ Lt hand)   MOBILITY STATUS:  primarily uses w/c, sup using walker shorter distances   UPPER EXTREMITY ROM:  RUE AROM WNL's.  LUE: Pt has approx 60* sh flexion w/ no control and compensations into abd, IR and forearm pronation. Pt has approx 75% ER, 50% IR. Full elbow flex, 75% elbow ext and supination, wrist WFL's, 90% finger flexion    HAND FUNCTION: Grip strength: Right: 68.1 lbs; Left: 14.5 lbs  COORDINATION: Box & Blocks Lt = 7,  9 hole peg unable LUE Pt able to pick up pen Lt hand but unable to manipulate  SENSATION: Light touch: absent except slight detection more proximal hand - unable to localize. Absent in fingertips Proprioception: Impaired    MUSCLE TONE: LUE: dystonia   COGNITION: Overall cognitive status:  inconsistent w/ comprehension of questions during eval, short term memory deficits  VISION: Subjective report: diplopia resolved Baseline vision: Bifocals Visual history:  none  VISION ASSESSMENT: Not tested  Patient has difficulty with following activities due to following visual impairments: Pt reports some blurriness w/ reading     TODAY'S TREATMENT:   Began assessing STG's - see below.  Practiced cutting "food" w/ rocker knife and cues for safety. Pt issued handout of rocker knife for purchase  Practiced picking up blocks for 3 jaw chuck prehensile grasp, then removed and replaced wooden pegs for cylindrical grasp w/ min distal hand under hand support from Rt hand to Lt wrist    HOME EXERCISE PROGRAM: 02/12/22:  safety considerations d/t lack of sensation Lt hand, functional tasks to perform w/ Lt hand, shoulder HEP, and A/E recommendations (bath mitt, hands free blow dryer holder)    GOALS: Goals reviewed with patient? Yes  SHORT TERM GOALS: Target date: 03/09/22  Independent with initial HEP  Baseline: Goal status: MET  2.  Pt to cut food w/ A/E prn and report greater ease opening peel containers (yogurts, deli meat) Baseline:  Goal status: MET  3.  Pt to improve LUE functional use by demo improvement on Box & Blocks to 13 or more Baseline: 7 blocks Goal status: IN PROGRESS (03/11/22: 8 blocks)   4.  Pt to perform UB dressing mod I level Baseline:  Goal status: MET  5.  Pt to perform low level functional reaching w/ min compensations to grasp/release 1" objects Baseline:  Goal status: MET  6.  Pt to consistently perform clothes management w/ min assist from LUE w/o LOB Baseline:  Goal status: IN PROGRESS - slips from Lt hand  7.  Pt to verbalize understanding of safety strategies d/t lack of sensation Lt hand Baseline:  Goal status: MET   LONG TERM GOALS: Target date: 05/07/22  Independent  w/ updated HEP  Baseline:  Goal status: INITIAL  2.  Pt to improve LUE function as evidenced by performing Box & Blocks to 18 or more Baseline: 7 blocks Goal status: INITIAL  3.  Pt to improve [place] hand as evidenced by performing 9 hole peg test in under 2 min Baseline: unable Goal status: INITIAL  4.  Pt to be mod I for all ADLS (except sup for shower transfers)  Baseline:  Goal status: INITIAL  5.  Pt to perform 70* sh flexion for functional light reaching with min compensations LUE Baseline:  Goal status: INITIAL  6.  Grip strength Lt hand to be 20 lbs or greater Baseline: 14.5 lbs Goal status: INITIAL  7.  Pt will be mod I for gardening task w/ DME/AE prn Baseline:  Goal status: INITIAL   ASSESSMENT:  CLINICAL IMPRESSION: This 10th progress note is for dates:  02/06/22 to 03/11/22 - Pt has met 5/7 STG's. Pt continues to make slow gains in recovery, limited by dense hemiplegia and sensory deficits. Pt will benefit from continued O.T. to address deficits in LUE function, balance, and ADLS  PERFORMANCE DEFICITS: in functional skills including ADLs, IADLs, coordination, dexterity, proprioception, sensation, tone, ROM, strength, pain, Fine motor control, Gross motor control, mobility, balance, endurance, decreased knowledge of use of DME, and UE functional use, cognitive skills including memory, problem solving, and safety awareness.   IMPAIRMENTS: are limiting patient from ADLs, IADLs, leisure, and social participation.   CO-MORBIDITIES; may have co-morbidities  that affects occupational performance. Patient will benefit from skilled OT to address above impairments and improve overall function.  MODIFICATION OR ASSISTANCE TO COMPLETE EVALUATION: Min-Moderate modification of tasks or assist with assess necessary to complete an evaluation.  OT OCCUPATIONAL PROFILE AND HISTORY: Problem focused assessment: Including review of records relating to presenting problem.  CLINICAL DECISION MAKING: Moderate - several treatment options, min-mod task modification necessary  REHAB POTENTIAL: Good  EVALUATION COMPLEXITY: Low    PLAN:  OT FREQUENCY: 2x/week  OT DURATION: 12 weeks  PLANNED INTERVENTIONS: self care/ADL training, therapeutic exercise, therapeutic activity, neuromuscular re-education, passive range of motion, functional mobility training, aquatic therapy, splinting, fluidotherapy, moist heat, patient/family education, cognitive remediation/compensation, visual/perceptual remediation/compensation, coping strategies training, and DME and/or AE instructions  RECOMMENDED OTHER SERVICES: will monitor for speech eval needs if cognition deficits impeding ADLS  CONSULTED AND AGREED WITH PLAN OF CARE: Patient and family member/caregiver  PLAN FOR NEXT  SESSION: continue neuro re-education LUE/trunk, try holding foam palms facing for sh flexion in supine, functional activities LUE   Redmond Baseman, OTR/L 03/11/22 11:07 AM Phone [phone] FAX [phone]

## 2022-03-12 MED ORDER — TIZANIDINE HCL 2 MG PO TABS: 2.0000 mg | ORAL_TABLET | Freq: Every day | ORAL | 5 refills | Status: DC

## 2022-03-14 ENCOUNTER — Ambulatory Visit: Payer: Medicare Other | Admitting: Occupational Therapy

## 2022-03-14 ENCOUNTER — Encounter: Payer: Self-pay | Admitting: Physical Therapy

## 2022-03-14 ENCOUNTER — Ambulatory Visit: Payer: Medicare Other | Admitting: Physical Therapy

## 2022-03-14 ENCOUNTER — Encounter: Payer: Self-pay | Admitting: Occupational Therapy

## 2022-03-14 VITALS — BP 156/93 | HR 84

## 2022-03-14 DIAGNOSIS — R208 Other disturbances of skin sensation: Secondary | ICD-10-CM

## 2022-03-14 DIAGNOSIS — I69154 Hemiplegia and 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 left non-dominant side: Secondary | ICD-10-CM | POA: Diagnosis not present

## 2022-03-14 DIAGNOSIS — R29818 Other symptoms and signs involving the nervous system: Secondary | ICD-10-CM

## 2022-03-14 DIAGNOSIS — I69318 Other symptoms and signs involving cognitive functions following cerebral infarction: Secondary | ICD-10-CM

## 2022-03-14 DIAGNOSIS — R2689 Other abnormalities of gait and mobility: Secondary | ICD-10-CM

## 2022-03-14 DIAGNOSIS — R2681 Unsteadiness on feet: Secondary | ICD-10-CM

## 2022-03-14 DIAGNOSIS — M6281 Muscle weakness (generalized): Secondary | ICD-10-CM

## 2022-03-14 DIAGNOSIS — R278 Other lack of coordination: Secondary | ICD-10-CM

## 2022-03-14 NOTE — Therapy (Addendum)
OUTPATIENT OCCUPATIONAL THERAPY NEURO TREATMENT  Patient Name: Charlotte Hunter MRN: [id number] DOB:[birth year], 66 y.o., female Today's Date: 03/14/2022  PCP: Su Grand, MD REFERRING PROVIDER: Bayard Hugger, NP    OT End of Session - 03/14/22 1112     Visit Number 11    Number of Visits 25    Date for OT Re-Evaluation 05/07/22    Authorization Type MCR, BC/BS    Authorization - Visit Number 9    Authorization - Number of Visits 10    Progress Note Due on Visit 10    OT Start Time 1112   pt in BR   OT Stop Time 1144    OT Time Calculation (min) 32 min               Past Medical History:  Diagnosis Date   A-fib ([place])    Aortic insufficiency    Hypertension    Stroke [facility])    Past Surgical History:  Procedure Laterality Date   ABDOMINAL HYSTERECTOMY     ABLATION  05/03/2021   heart   c section  01/1968   KNEE SURGERY Right    meniscus   SHOULDER SURGERY Right 08/2014   Patient Active Problem List   Diagnosis Date Noted   ICH (intracerebral hemorrhage) ([place]) 10/01/2021   Paroxysmal atrial fibrillation ([place]) 09/29/2021   Stenosis of intracranial vessel 09/29/2021   Essential hypertension 09/29/2021   Blurry vision 09/29/2021   GERD (gastroesophageal reflux disease) 09/29/2021   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 09/25/2021    ONSET DATE: 12/14/2021 Referral date  REFERRING DIAG: I61.9 (ICD-10-CM) - Hemorrhagic stroke ([place])   THERAPY DIAG:  Hemiplegia and 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 left non-dominant side ([name])  Other abnormalities of gait and mobility  Unsteadiness on feet  Muscle weakness (generalized)  Other lack of coordination  Other disturbances of skin sensation  Other symptoms and signs involving cognitive functions following cerebral infarction  Other symptoms and signs involving the nervous system  Rationale for Evaluation and Treatment  Rehabilitation  SUBJECTIVE:   SUBJECTIVE STATEMENT: Pt reports a lot of pain on Saturday but was better Sunday Pt accompanied by: self   PERTINENT HISTORY: 66 year old female here for evaluation of right thalamic intracerebral hemorrhage. Patient was admitted to the hospital in September 25, 2021 for new onset left-sided weakness headache and nausea. She was found to have intracerebral hemorrhage in the right thalamus. Patient was on Eliquis anticoagulation for atrial fibrillation at the time and this was reversed medically. She was started on hypertonic saline, blood pressure control in ICU admission. Patient went to inpatient rehabilitation. Then pt had HH therapies. Still has left-sided weakness and left hand and arm and leg pain. Has been to cardiology who recommended to hold anticoagulation for now as patient is not in atrial fibrillation.    Past medical history of atrial fibrillation on Eliquis aortic insufficiency, HTN, fibromyalgia  PRECAUTIONS: Fall and Other: NO lifting > 14 lbs, no driving  WEIGHT BEARING RESTRICTIONS: No  PAIN:  Are you having pain? Yes: NPRS scale: 8/10 Pain location: Lt hand (ulnar side) Pain description: burning, constant  Aggravating factors: cold weather Relieving factors: meds, voltaren gel  FALLS: Has patient fallen in last 6 months? No  LIVING ENVIRONMENT: Lives with: daughter, [name] Lives in: House/apartment Stairs: No - Ramp has been built at front entrance Has following equipment at home: w/c, quad cane large base, Walker - 2 wheeled, and bed side commode  PLOF: Independent;  pt did gardening prior to CVA - was not employed prior to onset of CVA   PATIENT GOALS: get my hand better, return to gardening  OBJECTIVE: from eval  HAND DOMINANCE: Right  ADLs: Eating: dependent for cutting food, mod I eating Grooming: mod I  UB Dressing: min assist w/ sports bra and getting shirt overhead LB Dressing: min assist w/ Lt shoe (d/t brace) and tying  shoes Toileting: uses BSC - min assist for clothes management Bathing: seated - w/ min to assist for back, bottom, and feet Tub Shower transfers: grab bar, ? Tub transfer bench for shower (w/ high thresh hold) - close sup to CGA  IADLs: Shopping: daughter performing now Light housekeeping: daughter performing now Meal Prep: pt can heat up things in microwave, make sandwich, however daughter cooking Community mobility: relies on daughter or friend for transportation Medication management: does w/ pillbox and strategies Financial management: mod I  Handwriting:  denies change (difficulty stabilizing check w/ Lt hand)   MOBILITY STATUS:  primarily uses w/c, sup using walker shorter distances   UPPER EXTREMITY ROM:  RUE AROM WNL's.  LUE: Pt has approx 60* sh flexion w/ no control and compensations into abd, IR and forearm pronation. Pt has approx 75% ER, 50% IR. Full elbow flex, 75% elbow ext and supination, wrist WFL's, 90% finger flexion    HAND FUNCTION: Grip strength: Right: 68.1 lbs; Left: 14.5 lbs  COORDINATION: Box & Blocks Lt = 7,  9 hole peg unable LUE Pt able to pick up pen Lt hand but unable to manipulate  SENSATION: Light touch: absent except slight detection more proximal hand - unable to localize. Absent in fingertips Proprioception: Impaired    MUSCLE TONE: LUE: dystonia   COGNITION: Overall cognitive status:  inconsistent w/ comprehension of questions during eval, short term memory deficits  VISION: Subjective report: diplopia resolved Baseline vision: Bifocals Visual history:  none  VISION ASSESSMENT: Not tested  Patient has difficulty with following activities due to following visual impairments: Pt reports some blurriness w/ reading     TODAY'S TREATMENT:   Pt reports that she had fall yesterday. Pt was using her cane with a friend. Therapist recommends use of walker with friend for safety. Therapist encourage pt to practice getting up and down  from floor safely as pt had to call EMS to get her up yesterday. Pt became tearful and declined. Reassurance provided Therapist demonstrated how to get up from the floor safely with a chair in front of her for pt/ and her dtr. Pt's dtr verbalized understanding. Closed chain shoulder flexion with boomwhacker reaching for floor, min v.c Arm bike x 4 mins for reciprocal motion, min v.c for positioning     HOME EXERCISE PROGRAM: 02/12/22: safety considerations d/t lack of sensation Lt hand, functional tasks to perform w/ Lt hand, shoulder HEP, and A/E recommendations (bath mitt, hands free blow dryer holder)    GOALS: Goals reviewed with patient? Yes  SHORT TERM GOALS: Target date: 03/09/22  Independent with initial HEP  Baseline: Goal status: MET  2.  Pt to cut food w/ A/E prn and report greater ease opening peel containers (yogurts, deli meat) Baseline:  Goal status: MET  3.  Pt to improve LUE functional use by demo improvement on Box & Blocks to 13 or more Baseline: 7 blocks Goal status: IN PROGRESS (03/11/22: 8 blocks)   4.  Pt to perform UB dressing mod I level Baseline:  Goal status: MET  5.  Pt to  perform low level functional reaching w/ min compensations to grasp/release 1" objects Baseline:  Goal status: MET  6.  Pt to consistently perform clothes management w/ min assist from LUE w/o LOB Baseline:  Goal status: IN PROGRESS - slips from Lt hand  7.  Pt to verbalize understanding of safety strategies d/t lack of sensation Lt hand Baseline:  Goal status: MET   LONG TERM GOALS: Target date: 05/07/22  Independent w/ updated HEP  Baseline:  Goal status: INITIAL  2.  Pt to improve LUE function as evidenced by performing Box & Blocks to 18 or more Baseline: 7 blocks Goal status: INITIAL  3.  Pt to improve [place] hand as evidenced by performing 9 hole peg test in under 2 min Baseline: unable Goal status: INITIAL  4.  Pt to be mod I for all ADLS (except sup for  shower transfers)  Baseline:  Goal status: INITIAL  5.  Pt to perform 70* sh flexion for functional light reaching with min compensations LUE Baseline:  Goal status: INITIAL  6.  Grip strength Lt hand to be 20 lbs or greater Baseline: 14.5 lbs Goal status: INITIAL  7.  Pt will be mod I for gardening task w/ DME/AE prn Baseline:  Goal status: INITIAL   ASSESSMENT:  CLINICAL IMPRESSION: Pt is progressing towards goals however today's session was limited by anxiety and emotion related to yesterday's fall.     PERFORMANCE DEFICITS: in functional skills including ADLs, IADLs, coordination, dexterity, proprioception, sensation, tone, ROM, strength, pain, Fine motor control, Gross motor control, mobility, balance, endurance, decreased knowledge of use of DME, and UE functional use, cognitive skills including memory, problem solving, and safety awareness.   IMPAIRMENTS: are limiting patient from ADLs, IADLs, leisure, and social participation.   CO-MORBIDITIES; may have co-morbidities  that affects occupational performance. Patient will benefit from skilled OT to address above impairments and improve overall function.  MODIFICATION OR ASSISTANCE TO COMPLETE EVALUATION: Min-Moderate modification of tasks or assist with assess necessary to complete an evaluation.  OT OCCUPATIONAL PROFILE AND HISTORY: Problem focused assessment: Including review of records relating to presenting problem.  CLINICAL DECISION MAKING: Moderate - several treatment options, min-mod task modification necessary  REHAB POTENTIAL: Good  EVALUATION COMPLEXITY: Low    PLAN:  OT FREQUENCY: 2x/week  OT DURATION: 12 weeks  PLANNED INTERVENTIONS: self care/ADL training, therapeutic exercise, therapeutic activity, neuromuscular re-education, passive range of motion, functional mobility training, aquatic therapy, splinting, fluidotherapy, moist heat, patient/family education, cognitive remediation/compensation,  visual/perceptual remediation/compensation, coping strategies training, and DME and/or AE instructions  RECOMMENDED OTHER SERVICES: will monitor for speech eval needs if cognition deficits impeding ADLS  CONSULTED AND AGREED WITH PLAN OF CARE: Patient and family member/caregiver  PLAN FOR NEXT SESSION: Consider adding more visits through [place]?, as pt is only scheduled through 03/21/22  continue neuro re-education LUE/trunk, try holding foam palms facing for sh flexion in supine, functional activities LUE    Theone Murdoch, OTR/L [phone] Phone: [phone] 8:06 AM 03/15/22

## 2022-03-14 NOTE — Therapy (Signed)
OUTPATIENT PHYSICAL THERAPY NEURO TREATMENT NOTE       Patient Name: Charlotte Hunter MRN: [id number] DOB:[birth year], 66 y.o., female Today's Date: 03/14/2022   PCP: Su Grand, MD REFERRING PROVIDER: Bayard Hugger, NP     PT End of Session - 03/14/22 1831     Visit Number 22    Number of Visits 29    Date for PT Re-Evaluation 03/29/22    Authorization Type Medicare    Authorization Time Period 01-03-22 - 04-03-22    Progress Note Due on Visit 20    PT Start Time 1150    PT Stop Time 1229    PT Time Calculation (min) 39 min    Equipment Utilized During Treatment Gait belt;Other (comment)   pt wearing her AFO:  SBQC   Activity Tolerance Patient tolerated treatment well    Behavior During Therapy WFL for tasks assessed/performed                             Past Medical History:  Diagnosis Date   A-fib [facility])    Aortic insufficiency    Hypertension    Stroke [facility])    Past Surgical History:  Procedure Laterality Date   ABDOMINAL HYSTERECTOMY     ABLATION  05/03/2021   heart   c section  01/1968   KNEE SURGERY Right    meniscus   SHOULDER SURGERY Right 08/2014   Patient Active Problem List   Diagnosis Date Noted   ICH (intracerebral hemorrhage) ([place]) 10/01/2021   Paroxysmal atrial fibrillation ([place]) 09/29/2021   Stenosis of intracranial vessel 09/29/2021   Essential hypertension 09/29/2021   Blurry vision 09/29/2021   GERD (gastroesophageal reflux disease) 09/29/2021   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 09/25/2021    ONSET DATE: 09-25-21  REFERRING DIAG: I61.9 (ICD-10-CM) - Hemorrhagic stroke ([name])   THERAPY DIAG:  Other abnormalities of gait and mobility  Unsteadiness on feet  Hemiplegia and 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 left non-dominant side ([name])  Rationale for Evaluation and Treatment Rehabilitation  SUBJECTIVE:                                                                                                                                                                                               SUBJECTIVE STATEMENT: Pt states she fell at home yesterday - was just standing at her wheelchair after she had walked in the hallway with her friend's assistance and fell - does not know what happened or why she fell; was unable to get up - called 911 for  assistance;  pt declines to attempt floor to stand transfer today  Pt accompanied by: self and daughter [name]  PERTINENT HISTORY: 66 year old female here for evaluation of right thalamic intracerebral hemorrhage. Patient is was admitted to the hospital in June 2023 for new onset left-sided weakness headache and nausea. She was found to have intracerebral hemorrhage in the right thalamus. Patient was on Eliquis anticoagulation for atrial fibrillation at the time and this was reversed medically. She was started on hypertonic saline, blood pressure control in ICU admission. Patient went to inpatient rehabilitation. Now patient back home. Still has left-sided weakness and left hand and arm and leg pain. Has been to cardiology who recommended to hold anticoagulation for now as patient is not in atrial fibrillation.   Past medical history of atrial fibrillation on Eliquis aortic insufficiency, hypertension, fibromyalgia  PAIN:  Are you having pain? Yes Location: Lt side of chin, foot and ankle and Lt buttock  Intensity: 7/10 Nerve pain, occasional shock sensation, "rope-like sensation"   PRECAUTIONS: Fall  WEIGHT BEARING RESTRICTIONS No  FALLS: Has patient fallen in last 6 months? No  LIVING ENVIRONMENT: Lives with: daughter, [name] Lives in: House/apartment Stairs: No - Ramp has been built at front entrance Has following equipment at home: Programmer, multimedia, Environmental consultant - 2 wheeled, and bed side commode  PLOF: Independent; pt did gardening prior to CVA - was not employed prior to onset of CVA  PATIENT  GOALS "be restored back to where I was"  OBJECTIVE:  GAIT: Gait pattern: decreased hip/knee flexion- Left, genu recurvatum- Left, scissoring, and narrow BOS Distance walked:  75' x 1 rep Assistive device utilized: RW Level of assistance: CGA with RW   NeuroRe-ed:  Pt performed standing with SBQC - pt stood with CGA; pt declined amb. With [facility], stating she "didn't feel with the program today"    Vitals:   03/14/22 1200  BP: (!) 156/93  Pulse: 84    Self Care; discussed amb. at home with daughter's assistance; recommended that they set a daily schedule for amb. 2x/day (15")  Discussed amb. With RW vs. SBQC - recommended to pt that she not amb. alone with RW due to episode of sudden LOB yesterday resulting in fall  TherEx:  Access Code: [place] - issued on 01-17-22 URL: [email or web address] Date: 01/17/2022 Prepared by: Ethelene Browns  Exercises - Seated Soleus Stretch  - 2-3 x daily - 7 x weekly - 1 sets - 2 reps - 15-20 secs  hold - Slant Board Gastrocnemius Stretch  - 3 x daily - 7 x weekly - 1 sets - 2 reps - 15-20 secs hold - Seated Gastroc Stretch with Strap  - 2-3 x daily - 7 x weekly - 1 sets - 2 reps - 15-20 hold   PATIENT EDUCATION:  03-11-22 Education details: discussed aquatic PT and offered pt appt next Wed., 03-20-22; pt states she would like an appt on a Monday due to less crowded at pool compared to Wed.'s Person educated: Patient Education method: Explanation, Demonstration Education comprehension: verbalized understanding, demonstration   HOME EXERCISE PROGRAM:  Previously issued Access Code: [email or web address] URL: [email or web address] Date: 01/08/2022 Prepared by: Ethelene Browns  Exercises - Bridge  - 1 x daily - 7 x weekly - 1 sets - 10 reps - 5 hold - Single Leg Bridge  - 1 x daily - 7 x weekly - 1 sets - 10 reps - 3 sec hold - Hooklying Clamshell with Resistance  - 1 x daily - 7  x weekly - 1 sets - 10 reps - 2-3 sec hold - Supine  Hip Flexion with Resistance Loop  - 1 x daily - 7 x weekly - 3 sets - 10 reps - Supine Heel Slide  - 1 x daily - 7 x weekly - 3 sets - 10 reps - Stepping out/in with LEFT leg  - 1 x daily - 7 x weekly - 3 sets - 10 reps - Hooklying Hamstring Stretch with Strap  - 1 x daily - 7 x weekly - 1 sets - 1-2 reps - 20 sec hold  GOALS: Goals reviewed with patient? Yes  SHORT TERM GOALS: Target date: 01/31/2022  Pt will perform basic transfers wheelchair to mat with supervision toward Rt side. Baseline: CGA Goal status: Progressing  - 01-28-22: pt requires CGA to SBA  2.  Amb. 115' with RW with CGA with AFO on LLE. Baseline: 26' with RW Goal status: Goal met 01-24-22  3.  Pt will stand for at least 5" with UE support prn with SBA:  pt will be able to reach 6" anteriorly without LOB with SBA for increased safety & independence with ADL's. Baseline:  Goal status: Goal met 01-29-22  4.  Perform bed mobility sit to/from supine with SBA.  Baseline: TBA Goal status: Partially met 01-28-22: pt uses rail on bed on Rt side to assist with supine to sit transfer; needs min assist to transfer supine to sit toward Lt side  5.  Assess Berg balance test and establish LTG as appropriate. Baseline:  score 16/56 (tested on 01-18-22) Goal status: Goal met   6.  Independent in HEP for LLE strengthening. Baseline:  Goal status: Goal met 01-24-22  NEW SHORT TERM GOALS:  TARGET DATE 02-28-22:   Perform basic transfers with supervision from wheelchair to/from mat toward Rt side. Baseline:  CGA to SBA in clinic Goal status:  Goal met  2.  Amb. 230' with RW with SBA on flat, even surface to demo improved endurance.  Baseline:  31' with RW with CGA:  70' with RW  with CGA  Goal status:  Goal partially met 02-28-22  3. Pt will report amb. In her bedroom at home with daughter's assistance with RW.   Baseline:  currently not attempting due to fear of falling  Goal status:  Pt states she has not attempted this  yet but plans to do so this weekend - 02-28-22  4.  Improve Berg score to >/= 26/56 to demo improved balance.  Baseline:  16/56 on 01-18-22; 31/56 on 02-28-22  Goal status: Goal met  5.  Assess TUG (LTG is set)  Baseline:  2"1 sec with [facility]  Goal status:  Goal met 02-19-22  6.  Amb. 26' with LBQC with min assist on flat, even surface.  Baseline:  35' x 1, 21' x 1 with [facility];  115' with LBQC on 02-28-22  Goal status: Goal met 02-19-22    LONG TERM GOALS: Target date: 03/28/2022  Modified independent household amb. with appropriate assistive device (RW or LBQC).  Baseline:  Goal status: INITIAL  2.  Pt will increase Berg balance test score by at least 12 points to demo improved balance and to reduce fall risk.  Baseline: to be assessed Goal status: INITIAL  3.  Amb. 350' with RW with SBA for increased community accessibility.  Baseline:  Goal status: INITIAL  4.  Negotiate 4 steps with use of handrail with SBA using step by step sequence. Baseline: unable to  perform at eval Goal status: INITIAL  5.  Pt will improve TUG score by at least 15 secs to demo improved functional mobility and reduced fall risk. Baseline:  Goal status: INITIAL  6.  Pt will negotiate ramp and curb with RW with SBA for increased community accessibility.  Baseline:  Goal status: INITIAL  ASSESSMENT:  CLINICAL IMPRESSION: PT session focused on gait training with RW (pt's request to use RW rather than SBQC) due to fall sustained yesterday - cause of fall unknown as pt states she was preparing to sit down in wheelchair after having walked in her hallway with her friend's assistance.  Pt reluctant to ambulate with [facility] and also needed coercion and persuation to amb. 47' with RW in today's session.  Pt stated her head did not feel clear today.  Pt's confidence shaken due to fall sustained at home.  Continue POC.   OBJECTIVE IMPAIRMENTS Abnormal gait, decreased activity tolerance, decreased balance, decreased  coordination, decreased endurance, decreased knowledge of use of DME, decreased ROM, decreased strength, impaired sensation, impaired tone, and impaired UE functional use.   ACTIVITY LIMITATIONS carrying, lifting, bending, standing, squatting, stairs, transfers, bed mobility, and locomotion level  PARTICIPATION LIMITATIONS: meal prep, cleaning, laundry, driving, shopping, community activity, and yard work  [place] and severity of deficits  are also affecting patient's functional outcome.   REHAB POTENTIAL: Good  CLINICAL DECISION MAKING: Evolving/moderate complexity  EVALUATION COMPLEXITY: Moderate  PLAN: PT FREQUENCY: 3x/week for initial 4 weeks, followed by 2x/week for 8 weeks  PT DURATION: 12 weeks  PLANNED INTERVENTIONS: Therapeutic exercises, Therapeutic activity, Neuromuscular re-education, Balance training, Gait training, Patient/Family education, Self Care, Stair training, Orthotic/Fit training, DME instructions, Aquatic Therapy, and Wheelchair mobility training  PLAN FOR NEXT SESSION:   amb. With RW with daughter's assistance?  continue gait with SBQC, LLE NMR and knee control   Kumar Falwell, [place], [place] [address] [place], [place] [address] 03/14/2022, 6:39 PM

## 2022-03-18 ENCOUNTER — Telehealth: Payer: Self-pay | Admitting: Diagnostic Neuroimaging

## 2022-03-18 ENCOUNTER — Ambulatory Visit: Payer: Medicare Other | Admitting: Physical Therapy

## 2022-03-18 ENCOUNTER — Ambulatory Visit: Payer: Medicare Other | Attending: Internal Medicine | Admitting: Occupational Therapy

## 2022-03-18 VITALS — BP 149/76 | HR 78

## 2022-03-18 DIAGNOSIS — R2689 Other abnormalities of gait and mobility: Secondary | ICD-10-CM | POA: Diagnosis present

## 2022-03-18 DIAGNOSIS — R29818 Other symptoms and signs involving the nervous system: Secondary | ICD-10-CM | POA: Diagnosis present

## 2022-03-18 DIAGNOSIS — R208 Other disturbances of skin sensation: Secondary | ICD-10-CM | POA: Insufficient documentation

## 2022-03-18 DIAGNOSIS — I69154 Hemiplegia and 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 left non-dominant side: Secondary | ICD-10-CM

## 2022-03-18 DIAGNOSIS — R2681 Unsteadiness on feet: Secondary | ICD-10-CM | POA: Diagnosis present

## 2022-03-18 DIAGNOSIS — R278 Other lack of coordination: Secondary | ICD-10-CM

## 2022-03-18 DIAGNOSIS — M6281 Muscle weakness (generalized): Secondary | ICD-10-CM | POA: Diagnosis present

## 2022-03-18 DIAGNOSIS — I69318 Other symptoms and signs involving cognitive functions following cerebral infarction: Secondary | ICD-10-CM | POA: Diagnosis present

## 2022-03-18 NOTE — Therapy (Signed)
OUTPATIENT PHYSICAL THERAPY NEURO TREATMENT NOTE       Patient Name: Charlotte Hunter MRN: [id number] DOB:[birth year], 66 y.o., female Today's Date: 03/18/2022   PCP: Su Grand, MD REFERRING PROVIDER: Bayard Hugger, NP     PT End of Session - 03/18/22 1023     Visit Number 23    Number of Visits 29    Date for PT Re-Evaluation 03/29/22    Authorization Type Medicare    Authorization Time Period 01-03-22 - 04-03-22    Progress Note Due on Visit 20    PT Start Time 1018   pt late   PT Stop Time 1100    PT Time Calculation (min) 42 min    Equipment Utilized During Treatment Gait belt;Other (comment)   pt wearing her AFO:  SBQC, RW   Activity Tolerance Patient tolerated treatment well    Behavior During Therapy WFL for tasks assessed/performed;Anxious                              Past Medical History:  Diagnosis Date   A-fib [facility])    Aortic insufficiency    Hypertension    Stroke [facility])    Past Surgical History:  Procedure Laterality Date   ABDOMINAL HYSTERECTOMY     ABLATION  05/03/2021   heart   c section  01/1968   KNEE SURGERY Right    meniscus   SHOULDER SURGERY Right 08/2014   Patient Active Problem List   Diagnosis Date Noted   ICH (intracerebral hemorrhage) ([place]) 10/01/2021   Paroxysmal atrial fibrillation ([place]) 09/29/2021   Stenosis of intracranial vessel 09/29/2021   Essential hypertension 09/29/2021   Blurry vision 09/29/2021   GERD (gastroesophageal reflux disease) 09/29/2021   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 09/25/2021    ONSET DATE: 09-25-21  REFERRING DIAG: I61.9 (ICD-10-CM) - Hemorrhagic stroke ([name])   THERAPY DIAG:  Unsteadiness on feet  Hemiplegia and 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 left non-dominant side ([name])  Muscle weakness (generalized)  Other lack of coordination  Other disturbances of skin sensation  Other abnormalities of gait and  mobility  Rationale for Evaluation and Treatment Rehabilitation  SUBJECTIVE:                                                                                                                                                                                              SUBJECTIVE STATEMENT: Pt reports ongoing pain in the L side of her face, buttocks, and LLE that feels like she is being "impaled". Pt encouraged to reach out  to her doctor about nerve medication. Pt also reports that she has decreased confidence with walking at home since her fall last week. Pt has been walking with her RW at home over the past weekend for short distances with her daughter.  Pt accompanied by: self and daughter Charlotte Hunter  PERTINENT HISTORY: 66 year old female here for evaluation of right thalamic intracerebral hemorrhage. Patient is was admitted to the hospital in June 2023 for new onset left-sided weakness headache and nausea. She was found to have intracerebral hemorrhage in the right thalamus. Patient was on Eliquis anticoagulation for atrial fibrillation at the time and this was reversed medically. She was started on hypertonic saline, blood pressure control in ICU admission. Patient went to inpatient rehabilitation. Now patient back home. Still has left-sided weakness and left hand and arm and leg pain. Has been to cardiology who recommended to hold anticoagulation for now as patient is not in atrial fibrillation.   Past medical history of atrial fibrillation on Eliquis aortic insufficiency, hypertension, fibromyalgia  PAIN:  Are you having pain? Yes Location: Lt side of chin, foot and ankle and Lt buttock  Intensity: 7/10 Nerve pain, occasional shock sensation, "rope-like sensation"   PRECAUTIONS: Fall  WEIGHT BEARING RESTRICTIONS No  FALLS: Has patient fallen in last 6 months? No  LIVING ENVIRONMENT: Lives with: daughter, Charlotte Hunter Lives in: House/apartment Stairs: No - Ramp has been built at front  entrance Has following equipment at home: Programmer, multimedia, Environmental consultant - 2 wheeled, and bed side commode  PLOF: Independent; pt did gardening prior to CVA - was not employed prior to onset of CVA  PATIENT GOALS "be restored back to where I was"  OBJECTIVE:  GAIT: Gait pattern:  mild scissoring LLE, step through pattern, decreased hip/knee flexion- Left, and genu recurvatum- Left Distance walked: 2 x 60 ft Assistive device utilized: Walker - 2 wheeled Level of assistance: CGA Comments: original setting of RW too high for patient, decreased height with 2nd bout of gait; pt exhibits decreased confidence with lowering of RW height, ongoing deviations as noted above, and decreased confidence with turns  Attempted to have pt then ambulate with [facility] but she becomes tearful and emotional. Pt reports not feeling confident with her balance with SBQC due to it feeling "wobbly". Transitioned to NMR activity as noted below.    NeuroRe-ed: Forward/backward stepping with SBQC with CGA to min A for balance to rebuild confidence standing and taking steps with this device. Transition back to short-distance gait with SBQC. Pt then able to ambulate x 10 ft with [facility] with verbal encouragement provided, pt exhibits increased gait speed and good balance on turns.   Self-Care/Home Management: Discussed where this therapist standing/guarding patient during gait. Encouraged pt to have her daughter attend next session for more hands-on training about how to guard during gait as pt reports decreased confidence with gait following her fall.    BP taken at beginning of session: Vitals:   03/18/22 1032  BP: (!) 149/76  Pulse: 78    TherEx:  Access Code: [email or web address] - issued on 01-17-22 URL: [email or web address] Date: 01/17/2022 Prepared by: Ethelene Browns  Exercises - Seated Soleus Stretch  - 2-3 x daily - 7 x weekly - 1 sets - 2 reps - 15-20 secs  hold - Slant Board Gastrocnemius Stretch  - 3 x  daily - 7 x weekly - 1 sets - 2 reps - 15-20 secs hold - Seated Gastroc Stretch with Strap  - 2-3 x daily - 7 x  weekly - 1 sets - 2 reps - 15-20 hold   PATIENT EDUCATION:  03-11-22 Education details: continue to practice gait with RW at home despite ongoing anxiety and fear of falling Person educated: Patient Education method: Explanation, Demonstration Education comprehension: verbalized understanding, demonstration   HOME EXERCISE PROGRAM:  Previously issued Access Code: [email or web address] URL: [email or web address] Date: 01/08/2022 Prepared by: Ethelene Browns  Exercises - Bridge  - 1 x daily - 7 x weekly - 1 sets - 10 reps - 5 hold - Single Leg Bridge  - 1 x daily - 7 x weekly - 1 sets - 10 reps - 3 sec hold - Hooklying Clamshell with Resistance  - 1 x daily - 7 x weekly - 1 sets - 10 reps - 2-3 sec hold - Supine Hip Flexion with Resistance Loop  - 1 x daily - 7 x weekly - 3 sets - 10 reps - Supine Heel Slide  - 1 x daily - 7 x weekly - 3 sets - 10 reps - Stepping out/in with LEFT leg  - 1 x daily - 7 x weekly - 3 sets - 10 reps - Hooklying Hamstring Stretch with Strap  - 1 x daily - 7 x weekly - 1 sets - 1-2 reps - 20 sec hold  GOALS: Goals reviewed with patient? Yes  SHORT TERM GOALS: Target date: 01/31/2022  Pt will perform basic transfers wheelchair to mat with supervision toward Rt side. Baseline: CGA Goal status: Progressing  - 01-28-22: pt requires CGA to SBA  2.  Amb. 115' with RW with CGA with AFO on LLE. Baseline: 28' with RW Goal status: Goal met 01-24-22  3.  Pt will stand for at least 5" with UE support prn with SBA:  pt will be able to reach 6" anteriorly without LOB with SBA for increased safety & independence with ADL's. Baseline:  Goal status: Goal met 01-29-22  4.  Perform bed mobility sit to/from supine with SBA.  Baseline: TBA Goal status: Partially met 01-28-22: pt uses rail on bed on Rt side to assist with supine to sit transfer; needs min  assist to transfer supine to sit toward Lt side  5.  Assess Berg balance test and establish LTG as appropriate. Baseline:  score 16/56 (tested on 01-18-22) Goal status: Goal met   6.  Independent in HEP for LLE strengthening. Baseline:  Goal status: Goal met 01-24-22  NEW SHORT TERM GOALS:  TARGET DATE 02-28-22:   Perform basic transfers with supervision from wheelchair to/from mat toward Rt side. Baseline:  CGA to SBA in clinic Goal status:  Goal met  2.  Amb. 230' with RW with SBA on flat, even surface to demo improved endurance.  Baseline:  19' with RW with CGA:  15' with RW  with CGA  Goal status:  Goal partially met 02-28-22  3. Pt will report amb. In her bedroom at home with daughter's assistance with RW.   Baseline:  currently not attempting due to fear of falling  Goal status:  Pt states she has not attempted this yet but plans to do so this weekend - 02-28-22  4.  Improve Berg score to >/= 26/56 to demo improved balance.  Baseline:  16/56 on 01-18-22; 31/56 on 02-28-22  Goal status: Goal met  5.  Assess TUG (LTG is set)  Baseline:  2"1 sec with [facility]  Goal status:  Goal met 02-19-22  6.  Amb. 35' with LBQC with min assist on flat, even surface.  Baseline:  14' x 1, 76' x 1 with [facility];  115' with LBQC on 02-28-22  Goal status: Goal met 02-19-22    LONG TERM GOALS: Target date: 03/28/2022  Modified independent household amb. with appropriate assistive device (RW or LBQC).  Baseline:  Goal status: INITIAL  2.  Pt will increase Berg balance test score by at least 12 points to demo improved balance and to reduce fall risk.  Baseline: to be assessed Goal status: INITIAL  3.  Amb. 350' with RW with SBA for increased community accessibility.  Baseline:  Goal status: INITIAL  4.  Negotiate 4 steps with use of handrail with SBA using step by step sequence. Baseline: unable to perform at eval Goal status: INITIAL  5.  Pt will improve TUG score by at least 15 secs to  demo improved functional mobility and reduced fall risk. Baseline:  Goal status: INITIAL  6.  Pt will negotiate ramp and curb with RW with SBA for increased community accessibility.  Baseline:  Goal status: INITIAL  ASSESSMENT:  CLINICAL IMPRESSION: Emphasis of skilled PT session on continuing to work towards gait with LRAD. Initially worked on gait with RW this session due to pt's decreased confidence and increased anxiety and fear of falling due to her fall at home last week. Pt exhibits ongoing anxiety even with use of RW with decreased confidence in her balance and LLE management with change in RW height. Pt initially very fearful about initiating gait with SBQC and becomes tearful and emotional. Provided emotional support and encouragement to patient and worked on static standing balance with SBQC with progression to taking a few steps forward/back with SBQC. Pt then has enough confidence to ambulate x 10 ft with [facility] with CGA to min A for balance. Pt continues to benefit from skilled therapy services to address ongoing L hemibody weakness leading to decreased balance, decreased strength, and decreased safety and independence with functional mobility. Continue POC.   OBJECTIVE IMPAIRMENTS Abnormal gait, decreased activity tolerance, decreased balance, decreased coordination, decreased endurance, decreased knowledge of use of DME, decreased ROM, decreased strength, impaired sensation, impaired tone, and impaired UE functional use.   ACTIVITY LIMITATIONS carrying, lifting, bending, standing, squatting, stairs, transfers, bed mobility, and locomotion level  PARTICIPATION LIMITATIONS: meal prep, cleaning, laundry, driving, shopping, community activity, and yard work  [place] and severity of deficits  are also affecting patient's functional outcome.   REHAB POTENTIAL: Good  CLINICAL DECISION MAKING: Evolving/moderate complexity  EVALUATION COMPLEXITY: Moderate  PLAN: PT  FREQUENCY: 3x/week for initial 4 weeks, followed by 2x/week for 8 weeks  PT DURATION: 12 weeks  PLANNED INTERVENTIONS: Therapeutic exercises, Therapeutic activity, Neuromuscular re-education, Balance training, Gait training, Patient/Family education, Self Care, Stair training, Orthotic/Fit training, DME instructions, Aquatic Therapy, and Wheelchair mobility training  PLAN FOR NEXT SESSION:   if Charlotte Hunter comes to appointment work on having her practice hands-on guarding with patient, continue gait with SBQC, LLE NMR and knee control   Excell Seltzer, PT, DPT, [place] [address] [place], [place] [address] 03/18/2022, 11:07 AM

## 2022-03-18 NOTE — Telephone Encounter (Signed)
Pt came into the office today to explain  she is having severe nerve pain on her left side. States there is a line down her body and her arm on the left side only which feels like a stabbing pain, states this pain happens worse in the afternoons and at night time.  Would like a call from a nurse to see if anything can be done or if an appt with Dr. Marjory Lies is needed.  [phone]

## 2022-03-18 NOTE — Telephone Encounter (Signed)
Called the patient to get more information. There was no answer. LVM asking the pt to call back.  Pt has been following with Dr Wynn Banker for management of this nerve pain she describes. LVM asking had she followed up with him  **If pt calls back, please ask has she followed up with this concern with Dr Wynn Banker? If not, I recommend contacting him for further evaluation.

## 2022-03-18 NOTE — Therapy (Signed)
OUTPATIENT OCCUPATIONAL THERAPY NEURO TREATMENT  Patient Name: Charlotte Hunter MRN: [id number] DOB:1955/09/10, 66 y.o., female Today's Date: 03/18/2022  PCP: Su Grand, MD REFERRING PROVIDER: Bayard Hugger, NP    OT End of Session - 03/18/22 1111     Visit Number 12    Number of Visits 25    Date for OT Re-Evaluation 05/07/22    Authorization Type MCR, BC/BS    Authorization - Number of Visits 10    Progress Note Due on Visit 10    OT Start Time 1105    OT Stop Time 1145    OT Time Calculation (min) 40 min    Activity Tolerance Patient tolerated treatment well    Behavior During Therapy [facility] for tasks assessed/performed;Anxious               Past Medical History:  Diagnosis Date   A-fib [facility])    Aortic insufficiency    Hypertension    Stroke [facility])    Past Surgical History:  Procedure Laterality Date   ABDOMINAL HYSTERECTOMY     ABLATION  05/03/2021   heart   c section  01/1968   KNEE SURGERY Right    meniscus   SHOULDER SURGERY Right 08/2014   Patient Active Problem List   Diagnosis Date Noted   ICH (intracerebral hemorrhage) ([place]) 10/01/2021   Paroxysmal atrial fibrillation ([place]) 09/29/2021   Stenosis of intracranial vessel 09/29/2021   Essential hypertension 09/29/2021   Blurry vision 09/29/2021   GERD (gastroesophageal reflux disease) 09/29/2021   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 09/25/2021    ONSET DATE: 12/14/2021 Referral date  REFERRING DIAG: I61.9 (ICD-10-CM) - Hemorrhagic stroke ([place])   THERAPY DIAG:  Unsteadiness on feet  Hemiplegia and 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 left non-dominant side ([name])  Other lack of coordination  Other symptoms and signs involving cognitive functions following cerebral infarction  Rationale for Evaluation and Treatment Rehabilitation  SUBJECTIVE:   SUBJECTIVE STATEMENT: I fell last Wednesday and my hand hurts more Pt accompanied by:  self   PERTINENT HISTORY: 66 year old female here for evaluation of right thalamic intracerebral hemorrhage. Patient was admitted to the hospital in September 25, 2021 for new onset left-sided weakness headache and nausea. She was found to have intracerebral hemorrhage in the right thalamus. Patient was on Eliquis anticoagulation for atrial fibrillation at the time and this was reversed medically. She was started on hypertonic saline, blood pressure control in ICU admission. Patient went to inpatient rehabilitation. Then pt had HH therapies. Still has left-sided weakness and left hand and arm and leg pain. Has been to cardiology who recommended to hold anticoagulation for now as patient is not in atrial fibrillation.    Past medical history of atrial fibrillation on Eliquis aortic insufficiency, HTN, fibromyalgia  PRECAUTIONS: Fall and Other: NO lifting > 14 lbs, no driving  WEIGHT BEARING RESTRICTIONS: No  PAIN:  Are you having pain? Yes: NPRS scale: 8/10 Pain location: Lt hand and wrist Pain description: burning, constant  Aggravating factors: cold weather Relieving factors: meds, voltaren gel  FALLS: Has patient fallen in last 6 months? No  LIVING ENVIRONMENT: Lives with: daughter, [name] Lives in: House/apartment Stairs: No - Ramp has been built at front entrance Has following equipment at home: w/c, quad cane large base, Walker - 2 wheeled, and bed side commode  PLOF: Independent; pt did gardening prior to CVA - was not employed prior to onset of CVA   PATIENT GOALS: get my hand  better, return to gardening  OBJECTIVE: from eval  HAND DOMINANCE: Right  ADLs: Eating: dependent for cutting food, mod I eating Grooming: mod I  UB Dressing: min assist w/ sports bra and getting shirt overhead LB Dressing: min assist w/ Lt shoe (d/t brace) and tying shoes Toileting: uses BSC - min assist for clothes management Bathing: seated - w/ min to assist for back, bottom, and feet Tub Shower  transfers: grab bar, ? Tub transfer bench for shower (w/ high thresh hold) - close sup to CGA  IADLs: Shopping: daughter performing now Light housekeeping: daughter performing now Meal Prep: pt can heat up things in microwave, make sandwich, however daughter cooking Community mobility: relies on daughter or friend for transportation Medication management: does w/ pillbox and strategies Financial management: mod I  Handwriting:  denies change (difficulty stabilizing check w/ Lt hand)   MOBILITY STATUS:  primarily uses w/c, sup using walker shorter distances   UPPER EXTREMITY ROM:  RUE AROM WNL's.  LUE: Pt has approx 60* sh flexion w/ no control and compensations into abd, IR and forearm pronation. Pt has approx 75% ER, 50% IR. Full elbow flex, 75% elbow ext and supination, wrist WFL's, 90% finger flexion    HAND FUNCTION: Grip strength: Right: 68.1 lbs; Left: 14.5 lbs  COORDINATION: Box & Blocks Lt = 7,  9 hole peg unable LUE Pt able to pick up pen Lt hand but unable to manipulate  SENSATION: Light touch: absent except slight detection more proximal hand - unable to localize. Absent in fingertips Proprioception: Impaired    MUSCLE TONE: LUE: dystonia   COGNITION: Overall cognitive status:  inconsistent w/ comprehension of questions during eval, short term memory deficits  VISION: Subjective report: diplopia resolved Baseline vision: Bifocals Visual history:  none  VISION ASSESSMENT: Not tested  Patient has difficulty with following activities due to following visual impairments: Pt reports some blurriness w/ reading     TODAY'S TREATMENT:   Wt bearing over LUE for proprioceptive input - full arm, then on elbow with multiple cues to concentrate on using LUE to push up from. Pt very hesitant to try, even though in safe position to do so.   Supine: self ROM in high range sh flexion to maintain shoulder movement. Followed by chest press motion holding foam (palms  facing) w/ inconsistent assistance needed to maintain Lt hand position on foam  Seated: pt picking up and stacking blocks LUE w/ hand under hand assist from Rt hand.   Discussed POC going forward and making more visits through current [place]: 02/12/22: safety considerations d/t lack of sensation Lt hand, functional tasks to perform w/ Lt hand, shoulder HEP, and A/E recommendations (bath mitt, hands free blow dryer holder)    GOALS: Goals reviewed with patient? Yes  SHORT TERM GOALS: Target date: 03/09/22  Independent with initial HEP  Baseline: Goal status: MET  2.  Pt to cut food w/ A/E prn and report greater ease opening peel containers (yogurts, deli meat) Baseline:  Goal status: MET  3.  Pt to improve LUE functional use by demo improvement on Box & Blocks to 13 or more Baseline: 7 blocks Goal status: IN PROGRESS (03/11/22: 8 blocks)   4.  Pt to perform UB dressing mod I level Baseline:  Goal status: MET  5.  Pt to perform low level functional reaching w/ min compensations to grasp/release 1" objects Baseline:  Goal status: MET  6.  Pt to  consistently perform clothes management w/ min assist from LUE w/o LOB Baseline:  Goal status: IN PROGRESS - slips from Lt hand  7.  Pt to verbalize understanding of safety strategies d/t lack of sensation Lt hand Baseline:  Goal status: MET   LONG TERM GOALS: Target date: 05/07/22  Independent w/ updated HEP  Baseline:  Goal status: INITIAL  2.  Pt to improve LUE function as evidenced by performing Box & Blocks to 18 or more Baseline: 7 blocks Goal status: INITIAL  3.  Pt to improve [place] hand as evidenced by performing 9 hole peg test in under 2 min Baseline: unable Goal status: INITIAL  4.  Pt to be mod I for all ADLS (except sup for shower transfers)  Baseline:  Goal status: INITIAL  5.  Pt to perform 70* sh flexion for functional light reaching with min compensations LUE Baseline:   Goal status: INITIAL  6.  Grip strength Lt hand to be 20 lbs or greater Baseline: 14.5 lbs Goal status: INITIAL  7.  Pt will be mod I for gardening task w/ DME/AE prn Baseline:  Goal status: INITIAL   ASSESSMENT:  CLINICAL IMPRESSION: Pt is progressing towards goals however pt is hesitant to try anything challenging in safe controlled environments.     PERFORMANCE DEFICITS: in functional skills including ADLs, IADLs, coordination, dexterity, proprioception, sensation, tone, ROM, strength, pain, Fine motor control, Gross motor control, mobility, balance, endurance, decreased knowledge of use of DME, and UE functional use, cognitive skills including memory, problem solving, and safety awareness.   IMPAIRMENTS: are limiting patient from ADLs, IADLs, leisure, and social participation.   CO-MORBIDITIES; may have co-morbidities  that affects occupational performance. Patient will benefit from skilled OT to address above impairments and improve overall function.  MODIFICATION OR ASSISTANCE TO COMPLETE EVALUATION: Min-Moderate modification of tasks or assist with assess necessary to complete an evaluation.  OT OCCUPATIONAL PROFILE AND HISTORY: Problem focused assessment: Including review of records relating to presenting problem.  CLINICAL DECISION MAKING: Moderate - several treatment options, min-mod task modification necessary  REHAB POTENTIAL: Good  EVALUATION COMPLEXITY: Low    PLAN:  OT FREQUENCY: 2x/week  OT DURATION: 12 weeks  PLANNED INTERVENTIONS: self care/ADL training, therapeutic exercise, therapeutic activity, neuromuscular re-education, passive range of motion, functional mobility training, aquatic therapy, splinting, fluidotherapy, moist heat, patient/family education, cognitive remediation/compensation, visual/perceptual remediation/compensation, coping strategies training, and DME and/or AE instructions  RECOMMENDED OTHER SERVICES: will monitor for speech eval  needs if cognition deficits impeding ADLS  CONSULTED AND AGREED WITH PLAN OF CARE: Patient and family member/caregiver  PLAN FOR NEXT SESSION:  continue neuro re-education LUE/trunk, continue holding foam palms facing for sh flexion in supine, functional activities LUE    Redmond Baseman, OTR/L 03/18/22 11:19 AM Phone [phone] FAX [phone]

## 2022-03-21 ENCOUNTER — Ambulatory Visit: Payer: Medicare Other | Admitting: Physical Therapy

## 2022-03-21 ENCOUNTER — Encounter: Payer: Self-pay | Admitting: Physical Therapy

## 2022-03-21 ENCOUNTER — Ambulatory Visit: Payer: Medicare Other | Admitting: Occupational Therapy

## 2022-03-21 DIAGNOSIS — M6281 Muscle weakness (generalized): Secondary | ICD-10-CM

## 2022-03-21 DIAGNOSIS — R2681 Unsteadiness on feet: Secondary | ICD-10-CM | POA: Diagnosis not present

## 2022-03-21 DIAGNOSIS — I69318 Other symptoms and signs involving cognitive functions following cerebral infarction: Secondary | ICD-10-CM

## 2022-03-21 DIAGNOSIS — R2689 Other abnormalities of gait and mobility: Secondary | ICD-10-CM

## 2022-03-21 DIAGNOSIS — R278 Other lack of coordination: Secondary | ICD-10-CM

## 2022-03-21 DIAGNOSIS — I69154 Hemiplegia and 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 left non-dominant side: Secondary | ICD-10-CM

## 2022-03-21 DIAGNOSIS — R208 Other disturbances of skin sensation: Secondary | ICD-10-CM

## 2022-03-21 DIAGNOSIS — R29818 Other symptoms and signs involving the nervous system: Secondary | ICD-10-CM

## 2022-03-21 NOTE — Therapy (Signed)
OUTPATIENT PHYSICAL THERAPY NEURO TREATMENT NOTE       Patient Name: Tani Virgo MRN: [id number] DOB:07-31-1955, 66 y.o., female Today's Date: 03/21/2022   PCP: Su Grand, MD REFERRING PROVIDER: Bayard Hugger, NP     PT End of Session - 03/21/22 1915     Visit Number 24    Number of Visits 29    Date for PT Re-Evaluation 03/29/22    Authorization Type Medicare    Authorization Time Period 01-03-22 - 04-03-22    Progress Note Due on Visit 20    PT Start Time 1017    PT Stop Time 1100    PT Time Calculation (min) 43 min    Equipment Utilized During Treatment Gait belt;Other (comment)   pt wearing her AFO, RW   Activity Tolerance Patient tolerated treatment well    Behavior During Therapy [facility] for tasks assessed/performed;Anxious                               Past Medical History:  Diagnosis Date   A-fib [facility])    Aortic insufficiency    Hypertension    Stroke [facility])    Past Surgical History:  Procedure Laterality Date   ABDOMINAL HYSTERECTOMY     ABLATION  05/03/2021   heart   c section  01/1968   KNEE SURGERY Right    meniscus   SHOULDER SURGERY Right 08/2014   Patient Active Problem List   Diagnosis Date Noted   ICH (intracerebral hemorrhage) ([place]) 10/01/2021   Paroxysmal atrial fibrillation ([place]) 09/29/2021   Stenosis of intracranial vessel 09/29/2021   Essential hypertension 09/29/2021   Blurry vision 09/29/2021   GERD (gastroesophageal reflux disease) 09/29/2021   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 09/25/2021    ONSET DATE: 09-25-21  REFERRING DIAG: I61.9 (ICD-10-CM) - Hemorrhagic stroke ([name])   THERAPY DIAG:  Unsteadiness on feet  Hemiplegia and 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 left non-dominant side ([name])  Muscle weakness (generalized)  Other abnormalities of gait and mobility  Rationale for Evaluation and Treatment Rehabilitation  SUBJECTIVE:                                                                                                                                                                                               SUBJECTIVE STATEMENT: Pt reports the nerve pain on the left side of her body (especially Lt buttock region) is a little better today than it was on Monday; says she has a Lidocaine patch on her buttock but does not have one on  her Lt foot today. Says she left message for Dr. Leta Baptist but did not hear back from his office and also called Dr. Letta Pate and left message for him but has not heard from his office either.  Pt states she walked 55' with RW yesterday with her friend, Santiago Glad, assisting her.   Pt accompanied by: self [name] left after arrival)  PERTINENT HISTORY: 66 year old female here for evaluation of right thalamic intracerebral hemorrhage. Patient is was admitted to the hospital in June 2023 for new onset left-sided weakness headache and nausea. She was found to have intracerebral hemorrhage in the right thalamus. Patient was on Eliquis anticoagulation for atrial fibrillation at the time and this was reversed medically. She was started on hypertonic saline, blood pressure control in ICU admission. Patient went to inpatient rehabilitation. Now patient back home. Still has left-sided weakness and left hand and arm and leg pain. Has been to cardiology who recommended to hold anticoagulation for now as patient is not in atrial fibrillation.   Past medical history of atrial fibrillation on Eliquis aortic insufficiency, hypertension, fibromyalgia  PAIN:  Are you having pain? Yes Location: Lt side of chin, foot and ankle and Lt buttock  Intensity: 6/10 Nerve pain, occasional shock sensation, "rope-like sensation"   PRECAUTIONS: Fall  WEIGHT BEARING RESTRICTIONS No  FALLS: Has patient fallen in last 6 months? No  LIVING ENVIRONMENT: Lives with: daughter, [name] Lives in: House/apartment Stairs: No - Ramp has  been built at front entrance Has following equipment at home: Programmer, multimedia, Environmental consultant - 2 wheeled, and bed side commode  PLOF: Independent; pt did gardening prior to CVA - was not employed prior to onset of CVA  PATIENT GOALS "be restored back to where I was"  OBJECTIVE:  03-21-22  Pt transferred from wheelchair to mat toward Lt side with SBA to CGA using stand pivot transfer   GAIT: Gait pattern:  mild scissoring LLE, step through pattern, decreased hip/knee flexion- Left, and genu recurvatum- Left; LLE noted to internally rotate intermittently  Distance walked: 115' x 1  Assistive device utilized: Environmental consultant - 2 wheeled Level of assistance: CGA Comments: pt requested RW be slightly higher than normal setting (1 notch higher) as it was in previous PT session this week   NeuroRe-ed: Sit to stand 5 reps from high low table without UE support; then positioned Rt foot slightly in front for increased weight bearing on LLE - pt performed 5 reps without UE support (moderate difficulty) with CGA  Pt performed tap ups onto 6" step with RUE support on RW - 10 reps with RLE to facilitate weight shift onto LLE and also for improved LLE SLS - min assist with this activity  Pt performed 3 reps of stepping forward (very small step) with RLE without UE support for improved LLE SLS     TherEx:  Access Code: [place] - issued on 01-17-22 URL: [email or web address] Date: 01/17/2022 Prepared by: Ethelene Browns  Exercises - Seated Soleus Stretch  - 2-3 x daily - 7 x weekly - 1 sets - 2 reps - 15-20 secs  hold - Slant Board Gastrocnemius Stretch  - 3 x daily - 7 x weekly - 1 sets - 2 reps - 15-20 secs hold - Seated Gastroc Stretch with Strap  - 2-3 x daily - 7 x weekly - 1 sets - 2 reps - 15-20 hold   PATIENT EDUCATION:  03-11-22 Education details: continue to practice gait with RW at home despite ongoing anxiety and fear of  falling Person educated: Patient Education method:  Explanation, Demonstration Education comprehension: verbalized understanding, demonstration   HOME EXERCISE PROGRAM:  Previously issued Access Code: [email or web address] URL: [email or web address] Date: 01/08/2022 Prepared by: Ethelene Browns  Exercises - Bridge  - 1 x daily - 7 x weekly - 1 sets - 10 reps - 5 hold - Single Leg Bridge  - 1 x daily - 7 x weekly - 1 sets - 10 reps - 3 sec hold - Hooklying Clamshell with Resistance  - 1 x daily - 7 x weekly - 1 sets - 10 reps - 2-3 sec hold - Supine Hip Flexion with Resistance Loop  - 1 x daily - 7 x weekly - 3 sets - 10 reps - Supine Heel Slide  - 1 x daily - 7 x weekly - 3 sets - 10 reps - Stepping out/in with LEFT leg  - 1 x daily - 7 x weekly - 3 sets - 10 reps - Hooklying Hamstring Stretch with Strap  - 1 x daily - 7 x weekly - 1 sets - 1-2 reps - 20 sec hold  GOALS: Goals reviewed with patient? Yes  SHORT TERM GOALS: Target date: 01/31/2022  Pt will perform basic transfers wheelchair to mat with supervision toward Rt side. Baseline: CGA Goal status: Progressing  - 01-28-22: pt requires CGA to SBA  2.  Amb. 115' with RW with CGA with AFO on LLE. Baseline: 60' with RW Goal status: Goal met 01-24-22  3.  Pt will stand for at least 5" with UE support prn with SBA:  pt will be able to reach 6" anteriorly without LOB with SBA for increased safety & independence with ADL's. Baseline:  Goal status: Goal met 01-29-22  4.  Perform bed mobility sit to/from supine with SBA.  Baseline: TBA Goal status: Partially met 01-28-22: pt uses rail on bed on Rt side to assist with supine to sit transfer; needs min assist to transfer supine to sit toward Lt side  5.  Assess Berg balance test and establish LTG as appropriate. Baseline:  score 16/56 (tested on 01-18-22) Goal status: Goal met   6.  Independent in HEP for LLE strengthening. Baseline:  Goal status: Goal met 01-24-22  NEW SHORT TERM GOALS:  TARGET DATE 02-28-22:   Perform basic  transfers with supervision from wheelchair to/from mat toward Rt side. Baseline:  CGA to SBA in clinic Goal status:  Goal met  2.  Amb. 230' with RW with SBA on flat, even surface to demo improved endurance.  Baseline:  59' with RW with CGA:  45' with RW  with CGA  Goal status:  Goal partially met 02-28-22  3. Pt will report amb. In her bedroom at home with daughter's assistance with RW.   Baseline:  currently not attempting due to fear of falling  Goal status:  Pt states she has not attempted this yet but plans to do so this weekend - 02-28-22  4.  Improve Berg score to >/= 26/56 to demo improved balance.  Baseline:  16/56 on 01-18-22; 31/56 on 02-28-22  Goal status: Goal met  5.  Assess TUG (LTG is set)  Baseline:  2"1 sec with [facility]  Goal status:  Goal met 02-19-22  6.  Amb. 54' with LBQC with min assist on flat, even surface.  Baseline:  37' x 1, 71' x 1 with [facility];  115' with LBQC on 02-28-22  Goal status: Goal met 02-19-22    LONG TERM GOALS: Target date: 03/28/2022  Modified independent household amb. with appropriate assistive device (RW or LBQC).  Baseline:  Goal status: INITIAL  2.  Pt will increase Berg balance test score by at least 12 points to demo improved balance and to reduce fall risk.  Baseline: to be assessed Goal status: INITIAL  3.  Amb. 350' with RW with SBA for increased community accessibility.  Baseline:  Goal status: INITIAL  4.  Negotiate 4 steps with use of handrail with SBA using step by step sequence. Baseline: unable to perform at eval Goal status: INITIAL  5.  Pt will improve TUG score by at least 15 secs to demo improved functional mobility and reduced fall risk. Baseline:  Goal status: INITIAL  6.  Pt will negotiate ramp and curb with RW with SBA for increased community accessibility.  Baseline:  Goal status: INITIAL  ASSESSMENT:  CLINICAL IMPRESSION: PT session focused on gait training with RW for maximal confidence and  stability/less anxiety with ambulation.  Pt requesting RW be at higher height as it was adjusted in previous session this week.  Pt continues to c/o nerve pain on Lt side of body with pt reporting slight reduction in pain (6/10) today compared to that in previous PT session (7/10 ) on Monday.  Pt's LLE noted to internally rotate during swing/stance phase of gait.  Attempted stretching Lt hip external rotators with knee extended and pt c/o moderate pain in lower leg and ankle in this position.  Continue POC.   OBJECTIVE IMPAIRMENTS Abnormal gait, decreased activity tolerance, decreased balance, decreased coordination, decreased endurance, decreased knowledge of use of DME, decreased ROM, decreased strength, impaired sensation, impaired tone, and impaired UE functional use.   ACTIVITY LIMITATIONS carrying, lifting, bending, standing, squatting, stairs, transfers, bed mobility, and locomotion level  PARTICIPATION LIMITATIONS: meal prep, cleaning, laundry, driving, shopping, community activity, and yard work  [place] and severity of deficits  are also affecting patient's functional outcome.   REHAB POTENTIAL: Good  CLINICAL DECISION MAKING: Evolving/moderate complexity  EVALUATION COMPLEXITY: Moderate  PLAN: PT FREQUENCY: 3x/week for initial 4 weeks, followed by 2x/week for 8 weeks  PT DURATION: 12 weeks  PLANNED INTERVENTIONS: Therapeutic exercises, Therapeutic activity, Neuromuscular re-education, Balance training, Gait training, Patient/Family education, Self Care, Stair training, Orthotic/Fit training, DME instructions, Aquatic Therapy, and Wheelchair mobility training  PLAN FOR NEXT SESSION:  gait with RW/SBQC: Begin checking LTG's - plan to renew:  LLE NMR and knee control   Deroy Noah, [place], [place] [address] [place], [place] [address] 03/21/2022, 7:50 PM

## 2022-03-21 NOTE — Therapy (Signed)
OUTPATIENT OCCUPATIONAL THERAPY NEURO TREATMENT  Patient Name: Charlotte Hunter MRN: [id number] DOB:[birth year], 66 y.o., female Today's Date: 03/21/2022  PCP: Su Grand, MD REFERRING PROVIDER: Bayard Hugger, NP    OT End of Session - 03/21/22 1244     Visit Number 13    Number of Visits 25    Date for OT Re-Evaluation 05/07/22    Authorization Type MCR, BC/BS    Authorization - Visit Number 10    Authorization - Number of Visits 10    OT Start Time 1108    OT Stop Time 1150    OT Time Calculation (min) 42 min    Activity Tolerance Patient tolerated treatment well    Behavior During Therapy WFL for tasks assessed/performed;Anxious               Past Medical History:  Diagnosis Date   A-fib [facility])    Aortic insufficiency    Hypertension    Stroke [facility])    Past Surgical History:  Procedure Laterality Date   ABDOMINAL HYSTERECTOMY     ABLATION  05/03/2021   heart   c section  01/1968   KNEE SURGERY Right    meniscus   SHOULDER SURGERY Right 08/2014   Patient Active Problem List   Diagnosis Date Noted   ICH (intracerebral hemorrhage) ([place]) 10/01/2021   Paroxysmal atrial fibrillation ([place]) 09/29/2021   Stenosis of intracranial vessel 09/29/2021   Essential hypertension 09/29/2021   Blurry vision 09/29/2021   GERD (gastroesophageal reflux disease) 09/29/2021   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 09/25/2021    ONSET DATE: 12/14/2021 Referral date  REFERRING DIAG: I61.9 (ICD-10-CM) - Hemorrhagic stroke ([place])   THERAPY DIAG:  Unsteadiness on feet  Hemiplegia and 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 left non-dominant side ([name])  Muscle weakness (generalized)  Other lack of coordination  Other disturbances of skin sensation  Other abnormalities of gait and mobility  Other symptoms and signs involving cognitive functions following cerebral infarction  Other symptoms and signs involving the  nervous system  Rationale for Evaluation and Treatment Rehabilitation  SUBJECTIVE:   SUBJECTIVE STATEMENT: I fell last Wednesday and my hand hurts more Pt accompanied by: self   PERTINENT HISTORY: 66 year old female here for evaluation of right thalamic intracerebral hemorrhage. Patient was admitted to the hospital in September 25, 2021 for new onset left-sided weakness headache and nausea. She was found to have intracerebral hemorrhage in the right thalamus. Patient was on Eliquis anticoagulation for atrial fibrillation at the time and this was reversed medically. She was started on hypertonic saline, blood pressure control in ICU admission. Patient went to inpatient rehabilitation. Then pt had HH therapies. Still has left-sided weakness and left hand and arm and leg pain. Has been to cardiology who recommended to hold anticoagulation for now as patient is not in atrial fibrillation.    Past medical history of atrial fibrillation on Eliquis aortic insufficiency, HTN, fibromyalgia  PRECAUTIONS: Fall and Other: NO lifting > 14 lbs, no driving  WEIGHT BEARING RESTRICTIONS: No  PAIN:  Are you having pain? Yes: NPRS scale: 8/10 Pain location: Lt hand and wrist Pain description: burning, constant  Aggravating factors: cold weather Relieving factors: meds, voltaren gel  FALLS: Has patient fallen in last 6 months? No  LIVING ENVIRONMENT: Lives with: daughter, [name] Lives in: House/apartment Stairs: No - Ramp has been built at front entrance Has following equipment at home: w/c, quad cane large base, Walker - 2 wheeled, and bed side commode  PLOF: Independent; pt did gardening prior to CVA - was not employed prior to onset of CVA   PATIENT GOALS: get my hand better, return to gardening  OBJECTIVE: from eval  HAND DOMINANCE: Right  ADLs: Eating: dependent for cutting food, mod I eating Grooming: mod I  UB Dressing: min assist w/ sports bra and getting shirt overhead LB Dressing: min  assist w/ Lt shoe (d/t brace) and tying shoes Toileting: uses BSC - min assist for clothes management Bathing: seated - w/ min to assist for back, bottom, and feet Tub Shower transfers: grab bar, ? Tub transfer bench for shower (w/ high thresh hold) - close sup to CGA  IADLs: Shopping: daughter performing now Light housekeeping: daughter performing now Meal Prep: pt can heat up things in microwave, make sandwich, however daughter cooking Community mobility: relies on daughter or friend for transportation Medication management: does w/ pillbox and strategies Financial management: mod I  Handwriting:  denies change (difficulty stabilizing check w/ Lt hand)   MOBILITY STATUS:  primarily uses w/c, sup using walker shorter distances   UPPER EXTREMITY ROM:  RUE AROM WNL's.  LUE: Pt has approx 60* sh flexion w/ no control and compensations into abd, IR and forearm pronation. Pt has approx 75% ER, 50% IR. Full elbow flex, 75% elbow ext and supination, wrist WFL's, 90% finger flexion    HAND FUNCTION: Grip strength: Right: 68.1 lbs; Left: 14.5 lbs  COORDINATION: Box & Blocks Lt = 7,  9 hole peg unable LUE Pt able to pick up pen Lt hand but unable to manipulate  SENSATION: Light touch: absent except slight detection more proximal hand - unable to localize. Absent in fingertips Proprioception: Impaired    MUSCLE TONE: LUE: dystonia   COGNITION: Overall cognitive status:  inconsistent w/ comprehension of questions during eval, short term memory deficits  VISION: Subjective report: diplopia resolved Baseline vision: Bifocals Visual history:  none  VISION ASSESSMENT: Not tested  Patient has difficulty with following activities due to following visual impairments: Pt reports some blurriness w/ reading     TODAY'S TREATMENT:   Wt bearing over LUE for proprioceptive input - full arm,  bending and straightening elbow with multiple cues to concentrate on using LUE to push up from.  Min -mod facilitation/ v.c Supine: self ROM for chest press motion holding  PVC pipe frame w/ min facilitation assistance needed to maintain Lt hand position on foam  UE ranger for AA/ROM on elevated surface for shoulder flexion, circumduction, horizontal abduction, min facilitation/ v.c  Arm bike x 4 mins, level 1 for conditioning with therapist facilitating L hand       HOME EXERCISE PROGRAM: 02/12/22: safety considerations d/t lack of sensation Lt hand, functional tasks to perform w/ Lt hand, shoulder HEP, and A/E recommendations (bath mitt, hands free blow dryer holder)    GOALS: Goals reviewed with patient? Yes  SHORT TERM GOALS: Target date: 03/09/22  Independent with initial HEP  Baseline: Goal status: MET  2.  Pt to cut food w/ A/E prn and report greater ease opening peel containers (yogurts, deli meat) Baseline:  Goal status: MET  3.  Pt to improve LUE functional use by demo improvement on Box & Blocks to 13 or more Baseline: 7 blocks Goal status: IN PROGRESS (03/11/22: 8 blocks)   4.  Pt to perform UB dressing mod I level Baseline:  Goal status: MET  5.  Pt to perform low level functional reaching w/ min compensations to grasp/release 1" objects  Baseline:  Goal status: MET  6.  Pt to consistently perform clothes management w/ min assist from LUE w/o LOB Baseline:  Goal status: IN PROGRESS - slips from Lt hand  7.  Pt to verbalize understanding of safety strategies d/t lack of sensation Lt hand Baseline:  Goal status: MET   LONG TERM GOALS: Target date: 05/07/22  Independent w/ updated HEP  Baseline:  Goal status: INITIAL  2.  Pt to improve LUE function as evidenced by performing Box & Blocks to 18 or more Baseline: 7 blocks Goal status: INITIAL  3.  Pt to improve Lena hand as evidenced by performing 9 hole peg test in under 2 min Baseline: unable Goal status: INITIAL  4.  Pt to be mod I for all ADLS (except sup for shower transfers)   Baseline:  Goal status: INITIAL  5.  Pt to perform 70* sh flexion for functional light reaching with min compensations LUE Baseline:  Goal status: INITIAL  6.  Grip strength Lt hand to be 20 lbs or greater Baseline: 14.5 lbs Goal status: INITIAL  7.  Pt will be mod I for gardening task w/ DME/AE prn Baseline:  Goal status: INITIAL   ASSESSMENT:  CLINICAL IMPRESSION: Pt is progressing towards goals. She demontrates improving weightbearing and control with UE ranger today.    PERFORMANCE DEFICITS: in functional skills including ADLs, IADLs, coordination, dexterity, proprioception, sensation, tone, ROM, strength, pain, Fine motor control, Gross motor control, mobility, balance, endurance, decreased knowledge of use of DME, and UE functional use, cognitive skills including memory, problem solving, and safety awareness.   IMPAIRMENTS: are limiting patient from ADLs, IADLs, leisure, and social participation.   CO-MORBIDITIES; may have co-morbidities  that affects occupational performance. Patient will benefit from skilled OT to address above impairments and improve overall function.  MODIFICATION OR ASSISTANCE TO COMPLETE EVALUATION: Min-Moderate modification of tasks or assist with assess necessary to complete an evaluation.  OT OCCUPATIONAL PROFILE AND HISTORY: Problem focused assessment: Including review of records relating to presenting problem.  CLINICAL DECISION MAKING: Moderate - several treatment options, min-mod task modification necessary  REHAB POTENTIAL: Good  EVALUATION COMPLEXITY: Low    PLAN:  OT FREQUENCY: 2x/week  OT DURATION: 12 weeks  PLANNED INTERVENTIONS: self care/ADL training, therapeutic exercise, therapeutic activity, neuromuscular re-education, passive range of motion, functional mobility training, aquatic therapy, splinting, fluidotherapy, moist heat, patient/family education, cognitive remediation/compensation, visual/perceptual  remediation/compensation, coping strategies training, and DME and/or AE instructions  RECOMMENDED OTHER SERVICES: will monitor for speech eval needs if cognition deficits impeding ADLS  CONSULTED AND AGREED WITH PLAN OF CARE: Patient and family member/caregiver  PLAN FOR NEXT SESSION:  continue neuro re-education LUE/trunk, continue holding foam palms facing for sh flexion in supine, functional activities LUE     03/21/22 12:48 PM Phone [phone] FAX [phone]

## 2022-03-25 ENCOUNTER — Telehealth: Payer: Self-pay

## 2022-03-25 ENCOUNTER — Ambulatory Visit: Payer: Medicare Other | Admitting: Physical Therapy

## 2022-03-25 VITALS — BP 150/85 | HR 82

## 2022-03-25 DIAGNOSIS — I69154 Hemiplegia and 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 left non-dominant side: Secondary | ICD-10-CM

## 2022-03-25 DIAGNOSIS — R2681 Unsteadiness on feet: Secondary | ICD-10-CM

## 2022-03-25 DIAGNOSIS — R2689 Other abnormalities of gait and mobility: Secondary | ICD-10-CM

## 2022-03-25 NOTE — Therapy (Unsigned)
OUTPATIENT PHYSICAL THERAPY NEURO TREATMENT NOTE       Patient Name: Charlotte Hunter MRN: [id number] DOB:1955/07/15, 66 y.o., female Today's Date: 03/26/2022   PCP: Su Grand, MD REFERRING PROVIDER: Bayard Hugger, NP     PT End of Session - 03/26/22 1511     Visit Number 25    Number of Visits 29    Date for PT Re-Evaluation 03/29/22    Authorization Type Medicare    Authorization Time Period 01-03-22 - 04-03-22    Progress Note Due on Visit 20    PT Start Time 1114   pt arrived late for appt   PT Stop Time 1146    PT Time Calculation (min) 32 min    Equipment Utilized During Treatment Gait belt;Other (comment)   pt wearing her AFO, RW   Activity Tolerance Patient tolerated treatment well    Behavior During Therapy [facility] for tasks assessed/performed;Anxious                                Past Medical History:  Diagnosis Date   A-fib [facility])    Aortic insufficiency    Hypertension    Stroke [facility])    Past Surgical History:  Procedure Laterality Date   ABDOMINAL HYSTERECTOMY     ABLATION  05/03/2021   heart   c section  01/1968   KNEE SURGERY Right    meniscus   SHOULDER SURGERY Right 08/2014   Patient Active Problem List   Diagnosis Date Noted   ICH (intracerebral hemorrhage) ([place]) 10/01/2021   Paroxysmal atrial fibrillation ([place]) 09/29/2021   Stenosis of intracranial vessel 09/29/2021   Essential hypertension 09/29/2021   Blurry vision 09/29/2021   GERD (gastroesophageal reflux disease) 09/29/2021   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 09/25/2021    ONSET DATE: 09-25-21  REFERRING DIAG: I61.9 (ICD-10-CM) - Hemorrhagic stroke ([place])   THERAPY DIAG:  Hemiplegia and 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 left non-dominant side ([name])  Unsteadiness on feet  Other abnormalities of gait and mobility  Rationale for Evaluation and Treatment Rehabilitation  SUBJECTIVE:                                                                                                                                                                                               SUBJECTIVE STATEMENT:               Pt arrives approx. 13" late for PT appt -  Pt reports she had a bad day yesterday (Sunday) - had severe pain -  stayed in bed all day yesterday; has a lIdocaine patch on her buttock and on her Lt foot; had a strange ("weird")  sensation yesterday before the pain started - not dizzy but nauseous and then excruciating pain occurred   Pt accompanied by: self Charlotte Hunter left after arrival)  PERTINENT HISTORY: 66 year old female here for evaluation of right thalamic intracerebral hemorrhage. Patient is was admitted to the hospital in June 2023 for new onset left-sided weakness headache and nausea. She was found to have intracerebral hemorrhage in the right thalamus. Patient was on Eliquis anticoagulation for atrial fibrillation at the time and this was reversed medically. She was started on hypertonic saline, blood pressure control in ICU admission. Patient went to inpatient rehabilitation. Now patient back home. Still has left-sided weakness and left hand and arm and leg pain. Has been to cardiology who recommended to hold anticoagulation for now as patient is not in atrial fibrillation.   Past medical history of atrial fibrillation on Eliquis aortic insufficiency, hypertension, fibromyalgia  PAIN:  Are you having pain? Yes Location: Lt side of chin, foot and ankle and Lt buttock  Intensity: 7-8/10 Nerve pain, occasional shock sensation, "rope-like sensation"   PRECAUTIONS: Fall  WEIGHT BEARING RESTRICTIONS No  FALLS: Has patient fallen in last 6 months? No  LIVING ENVIRONMENT: Lives with: daughter, Charlotte Hunter Lives in: House/apartment Stairs: No - Ramp has been built at front entrance Has following equipment at home: Programmer, multimedia, Environmental consultant - 2 wheeled, and bed side commode  PLOF:  Independent; pt did gardening prior to CVA - was not employed prior to onset of CVA  PATIENT GOALS "be restored back to where I was"   OBJECTIVE:  03-25-22  Pt transferred from wheelchair to mat toward rt side with SBA using stand pivot transfer with Rt hand placed on mat during transfer   GAIT: Gait pattern:  mild scissoring LLE, step through pattern, decreased hip/knee flexion- Left, and genu recurvatum- Left; LLE noted to internally rotate intermittently  Distance walked: 115' x 1  Assistive device utilized: Environmental consultant - 2 wheeled Level of assistance: CGA Comments: RW continues to be adjusted to slightly higher height, per pt's request; pt able to externally rotate Lt foot in stance phase with increased focus/attention to placement  End of session:  Attempted pt to amb. From clinic gym to lobby with RW - pt amb. Approx. 18' with CGA with RW and then reported fatigue, requesting w/c to be brought to her    NeuroRe-ed: Sit to stand 3 reps from high low table without UE support; then positioned Rt foot slightly in front for increased weight bearing on LLE - pt performed 3 reps without UE support (moderate difficulty) with CGA  Pt performed tap ups onto 4" step with RLE 10 reps with RUE support on RW with min to mod assist as pt had increased anxiety with this exercise    TherEx:  Access Code: [place] - issued on 01-17-22 URL: [email or web address] Date: 01/17/2022 Prepared by: Ethelene Browns  Exercises - Seated Soleus Stretch  - 2-3 x daily - 7 x weekly - 1 sets - 2 reps - 15-20 secs  hold - Slant Board Gastrocnemius Stretch  - 3 x daily - 7 x weekly - 1 sets - 2 reps - 15-20 secs hold - Seated Gastroc Stretch with Strap  - 2-3 x daily - 7 x weekly - 1 sets - 2 reps - 15-20 hold   PATIENT EDUCATION:  03-11-22 Education details: continue to practice gait  with RW at home despite ongoing anxiety and fear of falling Person educated: Patient Education method: Explanation,  Demonstration Education comprehension: verbalized understanding, demonstration   HOME EXERCISE PROGRAM:  Previously issued Access Code: [email or web address] URL: [email or web address] Date: 01/08/2022 Prepared by: Ethelene Browns  Exercises - Bridge  - 1 x daily - 7 x weekly - 1 sets - 10 reps - 5 hold - Single Leg Bridge  - 1 x daily - 7 x weekly - 1 sets - 10 reps - 3 sec hold - Hooklying Clamshell with Resistance  - 1 x daily - 7 x weekly - 1 sets - 10 reps - 2-3 sec hold - Supine Hip Flexion with Resistance Loop  - 1 x daily - 7 x weekly - 3 sets - 10 reps - Supine Heel Slide  - 1 x daily - 7 x weekly - 3 sets - 10 reps - Stepping out/in with LEFT leg  - 1 x daily - 7 x weekly - 3 sets - 10 reps - Hooklying Hamstring Stretch with Strap  - 1 x daily - 7 x weekly - 1 sets - 1-2 reps - 20 sec hold  GOALS: Goals reviewed with patient? Yes  SHORT TERM GOALS: Target date: 01/31/2022  Pt will perform basic transfers wheelchair to mat with supervision toward Rt side. Baseline: CGA Goal status: Progressing  - 01-28-22: pt requires CGA to SBA  2.  Amb. 115' with RW with CGA with AFO on LLE. Baseline: 62' with RW Goal status: Goal met 01-24-22  3.  Pt will stand for at least 5" with UE support prn with SBA:  pt will be able to reach 6" anteriorly without LOB with SBA for increased safety & independence with ADL's. Baseline:  Goal status: Goal met 01-29-22  4.  Perform bed mobility sit to/from supine with SBA.  Baseline: TBA Goal status: Partially met 01-28-22: pt uses rail on bed on Rt side to assist with supine to sit transfer; needs min assist to transfer supine to sit toward Lt side  5.  Assess Berg balance test and establish LTG as appropriate. Baseline:  score 16/56 (tested on 01-18-22) Goal status: Goal met   6.  Independent in HEP for LLE strengthening. Baseline:  Goal status: Goal met 01-24-22  NEW SHORT TERM GOALS:  TARGET DATE 02-28-22:   Perform basic transfers  with supervision from wheelchair to/from mat toward Rt side. Baseline:  CGA to SBA in clinic Goal status:  Goal met  2.  Amb. 230' with RW with SBA on flat, even surface to demo improved endurance.  Baseline:  74' with RW with CGA:  59' with RW  with CGA  Goal status:  Goal partially met 02-28-22  3. Pt will report amb. In her bedroom at home with daughter's assistance with RW.   Baseline:  currently not attempting due to fear of falling  Goal status:  Pt states she has not attempted this yet but plans to do so this weekend - 02-28-22  4.  Improve Berg score to >/= 26/56 to demo improved balance.  Baseline:  16/56 on 01-18-22; 31/56 on 02-28-22  Goal status: Goal met  5.  Assess TUG (LTG is set)  Baseline:  2"1 sec with [facility]  Goal status:  Goal met 02-19-22  6.  Amb. 37' with LBQC with min assist on flat, even surface.  Baseline:  45' x 1, 13' x 1 with [facility];  115' with LBQC on 02-28-22  Goal status: Goal met  02-19-22    LONG TERM GOALS: Target date: 03/28/2022  Modified independent household amb. with appropriate assistive device (RW or LBQC).  Baseline:  Goal status: INITIAL  2.  Pt will increase Berg balance test score by at least 12 points to demo improved balance and to reduce fall risk.  Baseline: to be assessed Goal status: INITIAL  3.  Amb. 350' with RW with SBA for increased community accessibility.  Baseline:  Goal status: INITIAL  4.  Negotiate 4 steps with use of handrail with SBA using step by step sequence. Baseline: unable to perform at eval Goal status: INITIAL  5.  Pt will improve TUG score by at least 15 secs to demo improved functional mobility and reduced fall risk. Baseline:  Goal status: INITIAL  6.  Pt will negotiate ramp and curb with RW with SBA for increased community accessibility.  Baseline:  Goal status: INITIAL  ASSESSMENT:  CLINICAL IMPRESSION: PT session focused on gait training with RW as pt continues to report limited ambulation  at home due to pain and also due to decreased confidence since fall sustained approx. 2 weeks ago.  LLE noted to internally rotate during swing/stance phase of gait but pt is able to externally rotate and position Lt foot in neutral with increased focus/attention and effort.  Pt's activity in today's session limited by late arrival and then c/o fatigue at end of session with pt unable to amb. From clinic gym to lobby.  Continue POC.   OBJECTIVE IMPAIRMENTS Abnormal gait, decreased activity tolerance, decreased balance, decreased coordination, decreased endurance, decreased knowledge of use of DME, decreased ROM, decreased strength, impaired sensation, impaired tone, and impaired UE functional use.   ACTIVITY LIMITATIONS carrying, lifting, bending, standing, squatting, stairs, transfers, bed mobility, and locomotion level  PARTICIPATION LIMITATIONS: meal prep, cleaning, laundry, driving, shopping, community activity, and yard work  [place] and severity of deficits  are also affecting patient's functional outcome.   REHAB POTENTIAL: Good  CLINICAL DECISION MAKING: Evolving/moderate complexity  EVALUATION COMPLEXITY: Moderate  PLAN: PT FREQUENCY: 3x/week for initial 4 weeks, followed by 2x/week for 8 weeks  PT DURATION: 12 weeks  PLANNED INTERVENTIONS: Therapeutic exercises, Therapeutic activity, Neuromuscular re-education, Balance training, Gait training, Patient/Family education, Self Care, Stair training, Orthotic/Fit training, DME instructions, Aquatic Therapy, and Wheelchair mobility training  PLAN FOR NEXT SESSION:  Begin checking LTG's -  I plan to renew: gait train with RW;  LLE NMR and knee control   Avriel Kandel, [place], [place] [address]. [place], [place] [address] 03/26/2022, 3:14 PM

## 2022-03-25 NOTE — Telephone Encounter (Signed)
Patient has been experiencing pain on her left side of her body to the point where she is crying and she is very uncomfortable, her left hand, left foot, left leg, and left buttock hurts the most and she is wondering if there is any other type of medication she can get or maybe an increase of gabapentin, she said she was sitting in her wheelchair and she felt a weird sensation, she felt dizzy and nauseous and then that's when the pain started and this all started before noon yesterday and would like to know what she should do, she has an upcoming appt with you on 04/11/22

## 2022-03-26 ENCOUNTER — Encounter: Payer: Self-pay | Admitting: Physical Therapy

## 2022-03-26 MED ORDER — GABAPENTIN 400 MG PO CAPS
ORAL_CAPSULE | ORAL | 1 refills | Status: DC
Start: 2022-03-26 — End: 2022-03-26

## 2022-03-26 MED ORDER — GABAPENTIN 400 MG PO CAPS
ORAL_CAPSULE | ORAL | 1 refills | Status: DC
Start: 2022-03-26 — End: 2022-03-28

## 2022-03-26 NOTE — Telephone Encounter (Signed)
Mrs Augsburger called back and I informed her of the increase in gabapentin to qid. She was asking if there were other medications that could be taken if this did not work. I mentioned she was on cymbalta but she said there were not refills on the med since discharge and she stopped taking it. I told her I would ask Dr Wynn Banker if she should continue taking the cymbalta.

## 2022-03-27 ENCOUNTER — Telehealth: Payer: Self-pay | Admitting: *Deleted

## 2022-03-27 MED ORDER — DULOXETINE HCL 20 MG PO CPEP: 20.0000 mg | ORAL_CAPSULE | Freq: Every day | ORAL | 1 refills | Status: DC

## 2022-03-27 NOTE — Telephone Encounter (Signed)
The daughter and the patient have called back separately about the pain that she is having and wanting to know what they can do. Dr Wynn Banker is unavailable so I consulted with Jacalyn Lefevre NP who saw her post hospital and advised her to take the tizanidine and cymbalta tonight as ordered (because she has not been taking these medications) and give that a chance to work. Otherwise she will have to go to the ED and be evaluated. I will ask Dr Natale Lay to look at the chart and see if he can increase the gabapentin at night as the daughter is suggesting.

## 2022-03-27 NOTE — Telephone Encounter (Signed)
Charlotte Hunter's daughter called about her mother having such severe pain. She is taking the 400 gabapentin qid now and it is not helping. They are using lidocaine patches on gluteal area and foot siwht out any relief. Her daughter Charlotte Hunter said that she is unable to get out of bed and when she did get out with a friends help she had a fall (soft landing) but had to call EMS to help get her up. Her mood is worse so I sent the refill of duloxetine you said she could add. Her daughter would like to speak with you if possible,  # [phone]

## 2022-03-28 ENCOUNTER — Other Ambulatory Visit: Payer: Self-pay | Admitting: Physical Medicine & Rehabilitation

## 2022-03-28 ENCOUNTER — Ambulatory Visit: Payer: Medicare Other | Admitting: Physical Therapy

## 2022-03-28 MED ORDER — GABAPENTIN 600 MG PO TABS: 600.0000 mg | ORAL_TABLET | Freq: Three times a day (TID) | ORAL | 1 refills | Status: DC

## 2022-04-01 ENCOUNTER — Encounter: Payer: Self-pay | Admitting: Physical Therapy

## 2022-04-01 ENCOUNTER — Ambulatory Visit: Payer: Medicare Other | Admitting: Physical Therapy

## 2022-04-01 DIAGNOSIS — R2681 Unsteadiness on feet: Secondary | ICD-10-CM

## 2022-04-01 DIAGNOSIS — M6281 Muscle weakness (generalized): Secondary | ICD-10-CM

## 2022-04-01 DIAGNOSIS — I69154 Hemiplegia and 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 left non-dominant side: Secondary | ICD-10-CM

## 2022-04-01 DIAGNOSIS — R2689 Other abnormalities of gait and mobility: Secondary | ICD-10-CM

## 2022-04-01 NOTE — Therapy (Signed)
OUTPATIENT PHYSICAL THERAPY NEURO TREATMENT NOTE       Patient Name: Charlotte Hunter MRN: [id number] DOB:1956-02-09, 66 y.o., female Today's Date: 04/01/2022   PCP: Su Grand, MD REFERRING PROVIDER: Bayard Hugger, NP     PT End of Session - 04/01/22 1918     Visit Number 26    Number of Visits 52   renewal completed for 16 additional visits   Date for PT Re-Evaluation 05/31/22    Authorization Type Medicare    Authorization Time Period 01-03-22 - 04-03-22;  04-01-22 - 06-13-22    Progress Note Due on Visit 30    PT Start Time 1106    PT Stop Time 1155    PT Time Calculation (min) 49 min    Equipment Utilized During Treatment Gait belt;Other (comment)   pt wearing her AFO, RW   Activity Tolerance Patient tolerated treatment well    Behavior During Therapy [facility] for tasks assessed/performed;Anxious                                 Past Medical History:  Diagnosis Date   A-fib [facility])    Aortic insufficiency    Hypertension    Stroke [facility])    Past Surgical History:  Procedure Laterality Date   ABDOMINAL HYSTERECTOMY     ABLATION  05/03/2021   heart   c section  01/1968   KNEE SURGERY Right    meniscus   SHOULDER SURGERY Right 08/2014   Patient Active Problem List   Diagnosis Date Noted   ICH (intracerebral hemorrhage) ([employer]) 10/01/2021   Paroxysmal atrial fibrillation ([place]) 09/29/2021   Stenosis of intracranial vessel 09/29/2021   Essential hypertension 09/29/2021   Blurry vision 09/29/2021   GERD (gastroesophageal reflux disease) 09/29/2021   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 09/25/2021    ONSET DATE: 09-25-21  REFERRING DIAG: I61.9 (ICD-10-CM) - Hemorrhagic stroke ([place])   THERAPY DIAG:  Hemiplegia and 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 left non-dominant side ([name])  Unsteadiness on feet  Other abnormalities of gait and mobility  Muscle weakness  (generalized)  Rationale for Evaluation and Treatment Rehabilitation  SUBJECTIVE:                                                                                                                                                                                              SUBJECTIVE STATEMENT:               Pt states she is doing better than she was last week - Dr. Letta Pate  increased Gabapentin dose to 663m qid; states she has not walked since last Monday in PT - was in excruciating pain - is doing better but says she feels a little out of it - also says it has affected her vision a little; pt continues to describe rope like area of nerve pain on left side of her body  Pt accompanied by: self (Baxter Flatteryleft after arrival)  PERTINENT HISTORY: 66year old female here for evaluation of right thalamic intracerebral hemorrhage. Patient is was admitted to the hospital in June 2023 for new onset left-sided weakness headache and nausea. She was found to have intracerebral hemorrhage in the right thalamus. Patient was on Eliquis anticoagulation for atrial fibrillation at the time and this was reversed medically. She was started on hypertonic saline, blood pressure control in ICU admission. Patient went to inpatient rehabilitation. Now patient back home. Still has left-sided weakness and left hand and arm and leg pain. Has been to cardiology who recommended to hold anticoagulation for now as patient is not in atrial fibrillation.   Past medical history of atrial fibrillation on Eliquis aortic insufficiency, hypertension, fibromyalgia  PAIN:  Are you having pain? Yes Location: Lt side of face & chin, edge of Lt arm (lateral side), foot and ankle and Lt buttock  Intensity: 8/10 Nerve pain, occasional shock sensation, "rope-like sensation"   PRECAUTIONS: Fall  WEIGHT BEARING RESTRICTIONS No  FALLS: Has patient fallen in last 6 months? No  LIVING ENVIRONMENT: Lives with: daughter, TBaxter FlatteryLives in:  House/apartment Stairs: No - Ramp has been built at front entrance Has following equipment at home: QProgrammer, multimedia WEnvironmental consultant- 2 wheeled, and bed side commode  PLOF: Independent; pt did gardening prior to CVA - was not employed prior to onset of CVA  PATIENT GOALS "be restored back to where I was"   OBJECTIVE:  04-01-22  At end of session - Pt transferred from mat to wheelchair toward Lt side with CGA  using stand pivot transfer - pt stood up, reached for w/c with Rt hand and turned in complete circle to Rt side to sit in wheelchair   GAIT: Gait pattern:  mild scissoring LLE, step through pattern, decreased hip/knee flexion- Left, and genu recurvatum- Left; LLE noted to internally rotate intermittently  Distance walked: 115' x 1  Assistive device utilized: WEnvironmental consultant- 2 wheeled Level of assistance: CGA Comments: RW continues to be adjusted to slightly higher height, per pt's request; pt able to externally rotate Lt foot in stance phase with increased focus/attention to placement      [facility]ed:        [facility] 0001       Berg Balance Test   Sit to Stand Able to stand  independently using hands    Standing Unsupported Able to stand 2 minutes with supervision    Sitting with Back Unsupported but Feet Supported on Floor or Stool Able to sit safely and securely 2 minutes    Stand to Sit Controls descent by using hands    Transfers Able to transfer safely, definite need of hands    Standing Unsupported with Eyes Closed Able to stand 10 seconds with supervision    Standing Unsupported with Feet Together Needs help to attain position but able to stand for 30 seconds with feet together    From Standing, Reach Forward with Outstretched Arm Can reach confidently >25 cm (10")    From Standing Position, Pick up Object from [place] pick  up shoe, needs supervision    From Standing Position, Turn to Look Behind Over each Shoulder Turn sideways only but maintains  balance    Turn 360 Degrees Needs assistance while turning    Standing Unsupported, Alternately Place Feet on Step/Stool Able to complete >2 steps/needs minimal assist    Standing Unsupported, One Foot in Front Able to take small step independently and hold 30 seconds    Standing on One Leg Tries to lift leg/unable to hold 3 seconds but remains standing independently    Total Score 33                 TherEx:  Access Code: [email or web address] - issued on 01-17-22 URL: [email or web address] Date: 01/17/2022 Prepared by: Ethelene Browns  Exercises - Seated Soleus Stretch  - 2-3 x daily - 7 x weekly - 1 sets - 2 reps - 15-20 secs  hold - Slant Board Gastrocnemius Stretch  - 3 x daily - 7 x weekly - 1 sets - 2 reps - 15-20 secs hold - Seated Gastroc Stretch with Strap  - 2-3 x daily - 7 x weekly - 1 sets - 2 reps - 15-20 hold   PATIENT EDUCATION:  03-11-22 Education details: continue to practice gait with RW at home despite ongoing anxiety and fear of falling Person educated: Patient Education method: Explanation, Demonstration Education comprehension: verbalized understanding, demonstration   HOME EXERCISE PROGRAM:  Previously issued Access Code: [email or web address] URL: [email or web address] Date: 01/08/2022 Prepared by: Ethelene Browns  Exercises - Bridge  - 1 x daily - 7 x weekly - 1 sets - 10 reps - 5 hold - Single Leg Bridge  - 1 x daily - 7 x weekly - 1 sets - 10 reps - 3 sec hold - Hooklying Clamshell with Resistance  - 1 x daily - 7 x weekly - 1 sets - 10 reps - 2-3 sec hold - Supine Hip Flexion with Resistance Loop  - 1 x daily - 7 x weekly - 3 sets - 10 reps - Supine Heel Slide  - 1 x daily - 7 x weekly - 3 sets - 10 reps - Stepping out/in with LEFT leg  - 1 x daily - 7 x weekly - 3 sets - 10 reps - Hooklying Hamstring Stretch with Strap  - 1 x daily - 7 x weekly - 1 sets - 1-2 reps - 20 sec hold  GOALS: Goals reviewed with patient? Yes   NEW SHORT TERM  GOALS:  TARGET DATE 05-03-22   Perform basic transfers with supervision from wheelchair to/from mat toward Rt or Lt side. Baseline:  CGA toward Lt side; SBA toward Rt side Goal status:  NEW  2.  Amb. 230' with RW with SBA on flat, even surface to demo improved endurance.  Baseline:  65' with RW with CGA on 04-01-22  Goal status:  Ongoing  3. Pt will amb. From lobby into/out of clinic with RW with CGA.  Baseline:  currently using wheelchair  Goal status:  NEW  4.  Improve Berg score to >/= 37/56 to demo improved balance.  Baseline:  16/56 on 01-18-22; 31/56 on 02-28-22;  33/56 on 04-01-22  Goal status: NEW  5.  Perform step negotiation with use of Rt hand rail with CGA (4 steps) using step by step sequence.  Baseline:  min assist  Goal status:  Ongoing    6.  Assess TUG with RW. Baseline:  2" with LBQC Goal status:  NEW  7.  Pt  will report decreased pain on Lt side of body to </= 4/10 after aquatic therapy. Baseline:  8/10 Goal status:  NEW   NEW LONG TERM GOALS:  TARGET DATE:  05-31-22  Modified independent household amb. With RW.  Baseline: CGA with RW with household ambulation Goal status: Ongoing  2.  Pt will increase Berg balance test score to >/= 41/56 to reduce fall risk.  Baseline:  33/56 on 04-01-22 Goal status: NEW  3.  Amb. 350' with RW with SBA for increased community accessibility.  Baseline:  Goal status: Ongoing - 15' with RW with CGA  4.  Negotiate 4 steps with use of handrail with SBA using step by step sequence. Baseline:  Goal status: ONGOING  5.  Pt will improve TUG score by at least 30 secs with RW to demo improved functional mobility and reduced fall risk.  Baseline:  Goal status: ONGOING  6.  Pt will negotiate ramp and curb with RW with SBA for increased community accessibility.  Baseline:  Goal status: ONGOING  ASSESSMENT:  CLINICAL IMPRESSION: PT session focused on assessment of LTG's for renewal.  Pt has met LTG #2 with Berg score  improving from 16/56 at initial assessment to 33/56 in today's session. This score is a 2 point increase from previous test score 1 month ago.   Pt did not meet LTG's #1, 3, 4, 5 and 6 due to increased pain experienced last week resulting in pt staying in bed most of the time, stating she could not attempt ambulation. Pt had a fall at home approx. 1 month ago and reports decreased confidence since this happened.  Pt is making slow progress but should improve mobility with medication dosage change to address pain. Continue POC.   OBJECTIVE IMPAIRMENTS Abnormal gait, decreased activity tolerance, decreased balance, decreased coordination, decreased endurance, decreased knowledge of use of DME, decreased ROM, decreased strength, impaired sensation, impaired tone, and impaired UE functional use.   ACTIVITY LIMITATIONS carrying, lifting, bending, standing, squatting, stairs, transfers, bed mobility, and locomotion level  PARTICIPATION LIMITATIONS: meal prep, cleaning, laundry, driving, shopping, community activity, and yard work  [place] and severity of deficits  are also affecting patient's functional outcome.   REHAB POTENTIAL: Good  CLINICAL DECISION MAKING: Evolving/moderate complexity  EVALUATION COMPLEXITY: Moderate  PLAN: PT FREQUENCY: 2x/week for 8 weeks  PT DURATION: 8 weeks  PLANNED INTERVENTIONS: Therapeutic exercises, Therapeutic activity, Neuromuscular re-education, Balance training, Gait training, Patient/Family education, Self Care, Stair training, Orthotic/Fit training, DME instructions, Aquatic Therapy, and Wheelchair mobility training  PLAN FOR NEXT SESSION: gait train with RW;  LLE NMR and knee control   Montrice Gracey, [place], [place] [address]. [place], [place] [address] 04/01/2022, 7:37 PM

## 2022-04-04 ENCOUNTER — Ambulatory Visit: Payer: Medicare Other | Admitting: Physical Therapy

## 2022-04-04 DIAGNOSIS — R2681 Unsteadiness on feet: Secondary | ICD-10-CM | POA: Diagnosis not present

## 2022-04-04 DIAGNOSIS — R2689 Other abnormalities of gait and mobility: Secondary | ICD-10-CM

## 2022-04-04 DIAGNOSIS — I69154 Hemiplegia and 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 left non-dominant side: Secondary | ICD-10-CM

## 2022-04-04 NOTE — Therapy (Signed)
OUTPATIENT PHYSICAL THERAPY NEURO TREATMENT NOTE       Patient Name: Charlotte Hunter MRN: [id number] DOB:1955-05-08, 66 y.o., female Today's Date: 04/05/2022   PCP: Charlotte Grand, MD REFERRING PROVIDER: Bayard Hugger, NP     PT End of Session - 04/05/22 0950     Visit Number 27    Number of Visits 61   renewal completed for 16 additional visits   Date for PT Re-Evaluation 05/31/22    Authorization Type Medicare    Authorization Time Period 01-03-22 - 04-03-22;  04-01-22 - 06-13-22    Progress Note Due on Visit 30    PT Start Time 1147    PT Stop Time 1237    PT Time Calculation (min) 50 min    Equipment Utilized During Treatment Gait belt;Other (comment)   pt wearing her AFO, RW   Activity Tolerance Patient tolerated treatment well    Behavior During Therapy [facility] for tasks assessed/performed;Anxious                                  Past Medical History:  Diagnosis Date   A-fib [facility])    Aortic insufficiency    Hypertension    Stroke [facility])    Past Surgical History:  Procedure Laterality Date   ABDOMINAL HYSTERECTOMY     ABLATION  05/03/2021   heart   c section  01/1968   KNEE SURGERY Right    meniscus   SHOULDER SURGERY Right 08/2014   Patient Active Problem List   Diagnosis Date Noted   ICH (intracerebral hemorrhage) ([place]) 10/01/2021   Paroxysmal atrial fibrillation ([place]) 09/29/2021   Stenosis of intracranial vessel 09/29/2021   Essential hypertension 09/29/2021   Blurry vision 09/29/2021   GERD (gastroesophageal reflux disease) 09/29/2021   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 09/25/2021    ONSET DATE: 09-25-21  REFERRING DIAG: I61.9 (ICD-10-CM) - Hemorrhagic stroke ([place])   THERAPY DIAG:  Hemiplegia and 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 left non-dominant side ([name])  Other abnormalities of gait and mobility  Unsteadiness on feet  Rationale for Evaluation and  Treatment Rehabilitation  SUBJECTIVE:                                                                                                                                                                                              SUBJECTIVE STATEMENT: Pt accompanied to PT by her friend, Charlotte Hunter;  pt states she has not walked much since PT on Monday but has done a lot of standing activities at home such  as laundry.  Pt also reports she seems to be getting more feeling back in her face - says the rope like numbness is moving laterally to the side; also reports increased sensation in Lt hand/thumb  Pt accompanied by: friend, Charlotte Hunter  PERTINENT HISTORY: 65 year old female here for evaluation of right thalamic intracerebral hemorrhage. Patient is was admitted to the hospital in June 2023 for new onset left-sided weakness headache and nausea. She was found to have intracerebral hemorrhage in the right thalamus. Patient was on Eliquis anticoagulation for atrial fibrillation at the time and this was reversed medically. She was started on hypertonic saline, blood pressure control in ICU admission. Patient went to inpatient rehabilitation. Now patient back home. Still has left-sided weakness and left hand and arm and leg pain. Has been to cardiology who recommended to hold anticoagulation for now as patient is not in atrial fibrillation.   Past medical history of atrial fibrillation on Eliquis aortic insufficiency, hypertension, fibromyalgia  PAIN:  Are you having pain? Yes Location: Lt side of face & chin, edge of Lt arm (lateral side), foot and ankle and Lt buttock  Intensity: 8/10 Nerve pain, occasional shock sensation, "rope-like sensation"   PRECAUTIONS: Fall  WEIGHT BEARING RESTRICTIONS No  FALLS: Has patient fallen in last 6 months? No  LIVING ENVIRONMENT: Lives with: daughter, Charlotte Hunter Lives in: House/apartment Stairs: No - Ramp has been built at front entrance Has following equipment at home: Engineer, site, Environmental consultant - 2 wheeled, and bed side commode  PLOF: Independent; pt did gardening prior to CVA - was not employed prior to onset of CVA  PATIENT GOALS "be restored back to where I was"   OBJECTIVE:  04-04-22   GAIT: Gait pattern:  mild scissoring LLE, step through pattern, decreased hip/knee flexion- Left, and genu recurvatum- Left; LLE noted to internally rotate intermittently  Distance walked: 115' x 1; pt gait trained from gym to car parked right outside front door, approx. 100' Assistive device utilized: Environmental consultant - 2 wheeled Level of assistance: CGA Comments: RW continues to be adjusted to slightly higher height, per pt's request; pt able to externally rotate Lt foot in stance phase with increased focus/attention to placement  Ramp Training; with RW - ascended ramp with min assist with cues for foot placement; descended curb with RW with mod assist due to significant fear of falling; cues for foot placement - to step forward enough so that toes hang over edge of curb;  pt did not want to walk down ramp due to fear of falling     NeuroRe-ed:                Pt performed tap ups to 6" with RUE support on RW with min assist due to fear of falling; step was placed by side of mat, inside RW; pt performed 2 sets 10 reps with cues to perform slowly for increased weight bearing and SLS on LLE     THERACT:   Pt performed car transfer with +1 min assist from RW - amb. From clinic to car parked outside front door     TherEx:  Access Code: [email or web address] - issued on 01-17-22 URL: [email or web address] Date: 01/17/2022 Prepared by: Maebelle Munroe  Exercises - Seated Soleus Stretch  - 2-3 x daily - 7 x weekly - 1 sets - 2 reps - 15-20 secs  hold - Slant Board Gastrocnemius Stretch  - 3 x daily - 7 x weekly - 1 sets - 2 reps - 15-20  secs hold - Seated Gastroc Stretch with Strap  - 2-3 x daily - 7 x weekly - 1 sets - 2 reps - 15-20 hold   PATIENT EDUCATION:   03-11-22 Education details: continue to practice gait with RW at home despite ongoing anxiety and fear of falling Person educated: Patient Education method: Explanation, Demonstration Education comprehension: verbalized understanding, demonstration   HOME EXERCISE PROGRAM:  Previously issued Access Code: [email or web address] URL: [email or web address] Date: 01/08/2022 Prepared by: Ethelene Browns  Exercises - Bridge  - 1 x daily - 7 x weekly - 1 sets - 10 reps - 5 hold - Single Leg Bridge  - 1 x daily - 7 x weekly - 1 sets - 10 reps - 3 sec hold - Hooklying Clamshell with Resistance  - 1 x daily - 7 x weekly - 1 sets - 10 reps - 2-3 sec hold - Supine Hip Flexion with Resistance Loop  - 1 x daily - 7 x weekly - 3 sets - 10 reps - Supine Heel Slide  - 1 x daily - 7 x weekly - 3 sets - 10 reps - Stepping out/in with LEFT leg  - 1 x daily - 7 x weekly - 3 sets - 10 reps - Hooklying Hamstring Stretch with Strap  - 1 x daily - 7 x weekly - 1 sets - 1-2 reps - 20 sec hold  GOALS: Goals reviewed with patient? Yes   NEW SHORT TERM GOALS:  TARGET DATE 05-03-22   Perform basic transfers with supervision from wheelchair to/from mat toward Rt or Lt side. Baseline:  CGA toward Lt side; SBA toward Rt side Goal status:  NEW  2.  Amb. 230' with RW with SBA on flat, even surface to demo improved endurance.  Baseline:  62' with RW with CGA on 04-01-22  Goal status:  Ongoing  3. Pt will amb. From lobby into/out of clinic with RW with CGA.  Baseline:  currently using wheelchair  Goal status:  NEW  4.  Improve Berg score to >/= 37/56 to demo improved balance.  Baseline:  16/56 on 01-18-22; 31/56 on 02-28-22;  33/56 on 04-01-22  Goal status: NEW  5.  Perform step negotiation with use of Rt hand rail with CGA (4 steps) using step by step sequence.  Baseline:  min assist  Goal status:  Ongoing    6.  Assess TUG with RW. Baseline:  2" with LBQC Goal status:  NEW  7.  Pt will report decreased  pain on Lt side of body to </= 4/10 after aquatic therapy. Baseline:  8/10 Goal status:  NEW   NEW LONG TERM GOALS:  TARGET DATE:  05-31-22  Modified independent household amb. With RW.  Baseline: CGA with RW with household ambulation Goal status: Ongoing  2.  Pt will increase Berg balance test score to >/= 41/56 to reduce fall risk.  Baseline:  33/56 on 04-01-22 Goal status: NEW  3.  Amb. 350' with RW with SBA for increased community accessibility.  Baseline:  Goal status: Ongoing - 26' with RW with CGA  4.  Negotiate 4 steps with use of handrail with SBA using step by step sequence. Baseline:  Goal status: ONGOING  5.  Pt will improve TUG score by at least 30 secs with RW to demo improved functional mobility and reduced fall risk.  Baseline:  Goal status: ONGOING  6.  Pt will negotiate ramp and curb with RW with SBA for increased community accessibility.  Baseline:  Goal  status: ONGOING  ASSESSMENT:  CLINICAL IMPRESSION: PT session focused on gait training with RW and on LLE SLS and closed chain strengthening.  Pt amb. From clinic gym to car with RW for first time today; performed car transfer from standing with RW with pt stating she had never transferred into car from standing with RW, had only performed this transfer from her wheelchair.  Pt needed min assist for correct technique but did well with transferring legs into car.  Continue POC.   OBJECTIVE IMPAIRMENTS Abnormal gait, decreased activity tolerance, decreased balance, decreased coordination, decreased endurance, decreased knowledge of use of DME, decreased ROM, decreased strength, impaired sensation, impaired tone, and impaired UE functional use.   ACTIVITY LIMITATIONS carrying, lifting, bending, standing, squatting, stairs, transfers, bed mobility, and locomotion level  PARTICIPATION LIMITATIONS: meal prep, cleaning, laundry, driving, shopping, community activity, and yard work  [place] and  severity of deficits  are also affecting patient's functional outcome.   REHAB POTENTIAL: Good  CLINICAL DECISION MAKING: Evolving/moderate complexity  EVALUATION COMPLEXITY: Moderate  PLAN: PT FREQUENCY: 2x/week for 8 weeks  PT DURATION: 8 weeks  PLANNED INTERVENTIONS: Therapeutic exercises, Therapeutic activity, Neuromuscular re-education, Balance training, Gait training, Patient/Family education, Self Care, Stair training, Orthotic/Fit training, DME instructions, Aquatic Therapy, and Wheelchair mobility training  PLAN FOR NEXT SESSION: gait train with RW;  LLE NMR and knee control   Eilee Schader, [place], [place] [address] [place], [place] [address] 04/05/2022, 2:22 PM

## 2022-04-05 ENCOUNTER — Encounter: Payer: Self-pay | Admitting: Physical Therapy

## 2022-04-09 ENCOUNTER — Ambulatory Visit: Payer: Medicare Other | Admitting: Physical Therapy

## 2022-04-09 ENCOUNTER — Encounter: Payer: Self-pay | Admitting: Physical Therapy

## 2022-04-09 ENCOUNTER — Telehealth: Payer: Self-pay | Admitting: Registered Nurse

## 2022-04-09 DIAGNOSIS — R2689 Other abnormalities of gait and mobility: Secondary | ICD-10-CM

## 2022-04-09 DIAGNOSIS — I69154 Hemiplegia and 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 left non-dominant side: Secondary | ICD-10-CM

## 2022-04-09 DIAGNOSIS — R2681 Unsteadiness on feet: Secondary | ICD-10-CM | POA: Diagnosis not present

## 2022-04-09 DIAGNOSIS — M6281 Muscle weakness (generalized): Secondary | ICD-10-CM

## 2022-04-09 MED ORDER — METHOCARBAMOL 500 MG PO TABS: ORAL_TABLET | ORAL | 1 refills | Status: DC

## 2022-04-09 NOTE — Telephone Encounter (Signed)
Robaxin, sent to pharmacy.

## 2022-04-09 NOTE — Therapy (Signed)
OUTPATIENT PHYSICAL THERAPY NEURO TREATMENT NOTE       Patient Name: Charlotte Hunter MRN: [id number] DOB:October 09, 1955, 66 y.o., female Today's Date: 04/10/2022   PCP: Su Grand, MD REFERRING PROVIDER: Bayard Hugger, NP     PT End of Session - 04/09/22 1259     Visit Number 28    Number of Visits 55   renewal completed for 16 additional visits   Date for PT Re-Evaluation 05/31/22    Authorization Type Medicare    Authorization Time Period 01-03-22 - 04-03-22;  04-01-22 - 06-13-22    Progress Note Due on Visit 30    PT Start Time 1103    PT Stop Time 1153    PT Time Calculation (min) 50 min    Equipment Utilized During Treatment Gait belt;Other (comment)   pt wearing her AFO, RW   Activity Tolerance Patient tolerated treatment well    Behavior During Therapy [facility] for tasks assessed/performed;Anxious                                   Past Medical History:  Diagnosis Date   A-fib [facility])    Aortic insufficiency    Hypertension    Stroke [facility])    Past Surgical History:  Procedure Laterality Date   ABDOMINAL HYSTERECTOMY     ABLATION  05/03/2021   heart   c section  01/1968   KNEE SURGERY Right    meniscus   SHOULDER SURGERY Right 08/2014   Patient Active Problem List   Diagnosis Date Noted   ICH (intracerebral hemorrhage) ([place]) 10/01/2021   Paroxysmal atrial fibrillation ([place]) 09/29/2021   Stenosis of intracranial vessel 09/29/2021   Essential hypertension 09/29/2021   Blurry vision 09/29/2021   GERD (gastroesophageal reflux disease) 09/29/2021   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 09/25/2021    ONSET DATE: 09-25-21  REFERRING DIAG: I61.9 (ICD-10-CM) - Hemorrhagic stroke ([place])   THERAPY DIAG:  Hemiplegia and 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 left non-dominant side ([name])  Other abnormalities of gait and mobility  Unsteadiness on feet  Muscle weakness  (generalized)  Rationale for Evaluation and Treatment Rehabilitation  SUBJECTIVE:                                                                                                                                                                                              SUBJECTIVE STATEMENT: Pt accompanied to PT by her friends, Charlotte Hunter & Charlotte Hunter: states she has been walking more at home with RW with Charlotte Hunter just standing by for  safety  Pt accompanied by: friends, Charlotte Hunter  PERTINENT HISTORY: 66 year old female here for evaluation of right thalamic intracerebral hemorrhage. Patient is was admitted to the hospital in June 2023 for new onset left-sided weakness headache and nausea. She was found to have intracerebral hemorrhage in the right thalamus. Patient was on Eliquis anticoagulation for atrial fibrillation at the time and this was reversed medically. She was started on hypertonic saline, blood pressure control in ICU admission. Patient went to inpatient rehabilitation. Now patient back home. Still has left-sided weakness and left hand and arm and leg pain. Has been to cardiology who recommended to hold anticoagulation for now as patient is not in atrial fibrillation.   Past medical history of atrial fibrillation on Eliquis aortic insufficiency, hypertension, fibromyalgia  PAIN:  Are you having pain? Yes Location: Lt side of face & chin, edge of Lt arm (lateral side), foot and ankle and Lt buttock  Intensity: 1-2/10  Nerve pain, occasional shock sensation, "rope-like sensation";   04-09-22: not so much in pain but more numbness in Lt side of face - states numbness is moving out    PRECAUTIONS: Fall  WEIGHT BEARING RESTRICTIONS No  FALLS: Has patient fallen in last 6 months? No  LIVING ENVIRONMENT: Lives with: daughter, Charlotte Hunter Lives in: House/apartment Stairs: No - Ramp has been built at front entrance Has following equipment at home: Programmer, multimedia, Environmental consultant - 2 wheeled, and bed  side commode  PLOF: Independent; pt did gardening prior to CVA - was not employed prior to onset of CVA  PATIENT GOALS "be restored back to where I was"   OBJECTIVE:  04-09-22   GAIT: Gait pattern:  mild internal rotation LLE, step through pattern, decreased hip/knee flexion- Left, and genu recurvatum- Left; LLE noted to internally rotate intermittently  Distance walked: 115' x 1; pt gait trained from gym to car parked right outside front door, approx. 125' at end of session Assistive device utilized: Walker - 2 wheeled Level of assistance: CGA Comments: RW continues to be adjusted to slightly higher height, per pt's request; pt able to externally rotate Lt foot in stance phase with increased focus/attention to placement Cues to increase Lt knee flexion in swing phase of gait as able       NeuroRe-ed:                Pt performed RLE standing hip flexion, extension, abduction 6 reps for Rt hip musc. Strengthening and for LLE SLS and closed chain strengthening with bil. UE support on counter  Step ups onto 6" step LLE - with RUE support on hand rail 5 reps with CGA to min assist    Pt performed 3 small mini squats standing by side of mat - holding onto RW with bil. Ue's; instructed pt to only do small range with this exercise (pt states she has been doing this exercise at home and requests to demo) - educated pt in importance of only doing small range as this exercise can be stressful on the knee joint; recommended pt not to do this exercise on days she is experiencing increased Rt knee pain    LLE passive internal/external rotation with pt in seated position - knee extended; pt then performed AAROM Lt hip internal/external rotation 15 reps; pt able to externally rotate LLE from full internal rotation to neutral position but needs assistance to externally rotate Lt leg through full ROM      THERACT:   Pt performed car transfer  with CGA and cues for positioning and sequence-  from RW - amb.  From clinic to car parked outside front door          Pt able to transfer LE's from seated position outside of car to inside car (pt sitting in passenger seat);  needed assistance with seat belt placement    TherEx:  Access Code: [place] - issued on 01-17-22 URL: [email or web address] Date: 01/17/2022 Prepared by: Ethelene Browns  Exercises - Seated Soleus Stretch  - 2-3 x daily - 7 x weekly - 1 sets - 2 reps - 15-20 secs  hold - Slant Board Gastrocnemius Stretch  - 3 x daily - 7 x weekly - 1 sets - 2 reps - 15-20 secs hold - Seated Gastroc Stretch with Strap  - 2-3 x daily - 7 x weekly - 1 sets - 2 reps - 15-20 hold   PATIENT EDUCATION:  03-11-22 Education details: continue to practice gait with RW at home despite ongoing anxiety and fear of falling Person educated: Patient Education method: Explanation, Demonstration Education comprehension: verbalized understanding, demonstration   HOME EXERCISE PROGRAM:  Previously issued Access Code: [email or web address] URL: [email or web address] Date: 01/08/2022 Prepared by: Ethelene Browns  Exercises - Bridge  - 1 x daily - 7 x weekly - 1 sets - 10 reps - 5 hold - Single Leg Bridge  - 1 x daily - 7 x weekly - 1 sets - 10 reps - 3 sec hold - Hooklying Clamshell with Resistance  - 1 x daily - 7 x weekly - 1 sets - 10 reps - 2-3 sec hold - Supine Hip Flexion with Resistance Loop  - 1 x daily - 7 x weekly - 3 sets - 10 reps - Supine Heel Slide  - 1 x daily - 7 x weekly - 3 sets - 10 reps - Stepping out/in with LEFT leg  - 1 x daily - 7 x weekly - 3 sets - 10 reps - Hooklying Hamstring Stretch with Strap  - 1 x daily - 7 x weekly - 1 sets - 1-2 reps - 20 sec hold  GOALS: Goals reviewed with patient? Yes   NEW SHORT TERM GOALS:  TARGET DATE 05-03-22   Perform basic transfers with supervision from wheelchair to/from mat toward Rt or Lt side. Baseline:  CGA toward Lt side; SBA toward Rt side Goal status:  NEW  2.  Amb. 230'  with RW with SBA on flat, even surface to demo improved endurance.  Baseline:  29' with RW with CGA on 04-01-22  Goal status:  Ongoing  3. Pt will amb. From lobby into/out of clinic with RW with CGA.  Baseline:  currently using wheelchair  Goal status:  NEW  4.  Improve Berg score to >/= 37/56 to demo improved balance.  Baseline:  16/56 on 01-18-22; 31/56 on 02-28-22;  33/56 on 04-01-22  Goal status: NEW  5.  Perform step negotiation with use of Rt hand rail with CGA (4 steps) using step by step sequence.  Baseline:  min assist  Goal status:  Ongoing    6.  Assess TUG with RW. Baseline:  2" with [facility] Goal status:  NEW  7.  Pt will report decreased pain on Lt side of body to </= 4/10 after aquatic therapy. Baseline:  8/10 Goal status:  NEW   NEW LONG TERM GOALS:  TARGET DATE:  05-31-22  Modified independent household amb. With RW.  Baseline: CGA with RW with household ambulation Goal status: Ongoing  2.  Pt will increase Berg balance test score to >/= 41/56 to reduce fall risk.  Baseline:  33/56 on 04-01-22 Goal status: NEW  3.  Amb. 350' with RW with SBA for increased community accessibility.  Baseline:  Goal status: Ongoing - 42' with RW with CGA  4.  Negotiate 4 steps with use of handrail with SBA using step by step sequence. Baseline:  Goal status: ONGOING  5.  Pt will improve TUG score by at least 30 secs with RW to demo improved functional mobility and reduced fall risk.  Baseline:  Goal status: ONGOING  6.  Pt will negotiate ramp and curb with RW with SBA for increased community accessibility.  Baseline:  Goal status: ONGOING  ASSESSMENT:  CLINICAL IMPRESSION: PT session focused on gait training with RW with pt ambulating from clinic gym to car for 2nd time.  Pt's LLE was noted to be internally rotating during swing/stance phase of gait but much improvement noted with Lt foot positioning as it was in neutral position after passively stretching and  performing AAROM Lt hip internal and external rotation.  Pt continues to have increased pain and discomfort in Rt hip with prolonged standing/ambulation - attempted 2 laps ambulation distance today but pt requested seated rest period after ambulating 1 lap due to c/o Rt hip discomfort.   Continue POC.   OBJECTIVE IMPAIRMENTS Abnormal gait, decreased activity tolerance, decreased balance, decreased coordination, decreased endurance, decreased knowledge of use of DME, decreased ROM, decreased strength, impaired sensation, impaired tone, and impaired UE functional use.   ACTIVITY LIMITATIONS carrying, lifting, bending, standing, squatting, stairs, transfers, bed mobility, and locomotion level  PARTICIPATION LIMITATIONS: meal prep, cleaning, laundry, driving, shopping, community activity, and yard work  PERSONAL FACTORS Fitness and severity of deficits  are also affecting patient's functional outcome.   REHAB POTENTIAL: Good  CLINICAL DECISION MAKING: Evolving/moderate complexity  EVALUATION COMPLEXITY: Moderate  PLAN: PT FREQUENCY: 2x/week for 8 weeks  PT DURATION: 8 weeks  PLANNED INTERVENTIONS: Therapeutic exercises, Therapeutic activity, Neuromuscular re-education, Balance training, Gait training, Patient/Family education, Self Care, Stair training, Orthotic/Fit training, DME instructions, Aquatic Therapy, and Wheelchair mobility training  PLAN FOR NEXT SESSION: cont gait train with RW - try to increase distance as tolerated - 1 lap has been furthest distance to date due to c/o Rt hip discomfort:   LLE NMR and knee control; standing balance activities; starting aquatic therapy on 04-22-21   Elenna Spratling, Donavan Burnet, PT  [facility] [address] [place], [place] [address] 04/10/2022, 9:21 AM

## 2022-04-09 NOTE — Telephone Encounter (Signed)
She is needing her Robaxin refilled she needs it sent to [facility] on [address] in [place]

## 2022-04-11 ENCOUNTER — Encounter: Payer: Medicare Other | Attending: Registered Nurse | Admitting: Physical Medicine & Rehabilitation

## 2022-04-11 ENCOUNTER — Ambulatory Visit: Payer: Self-pay | Admitting: Physical Therapy

## 2022-04-11 ENCOUNTER — Encounter: Payer: Self-pay | Admitting: Physical Medicine & Rehabilitation

## 2022-04-11 VITALS — BP 153/89 | HR 73 | Ht 66.0 in

## 2022-04-11 DIAGNOSIS — I619 Nontraumatic intracerebral hemorrhage, unspecified: Secondary | ICD-10-CM | POA: Diagnosis present

## 2022-04-11 DIAGNOSIS — G890 Central pain syndrome: Secondary | ICD-10-CM

## 2022-04-11 MED ORDER — GABAPENTIN 400 MG PO CAPS: 400.0000 mg | ORAL_CAPSULE | Freq: Four times a day (QID) | ORAL | 6 refills | Status: DC

## 2022-04-11 NOTE — Progress Notes (Signed)
Subjective:    Patient ID: Charlotte Hunter, female    DOB: [birth year], 66 y.o.   MRN: [id number] Supine-sitting right edge of bed, head of bed elevated slightly elevated using bed rail via logroll technique to increase patient independence with light mod assist for left hemibody management and trunk upright-max cueing for sequencing. Left squat pivot transfers edge of bed wheelchair with moderate assist for lifting pivoting hips. Gait 55 feet in light gait harness for safety  Pertaining to patient's right thalamic ICH 09/25/2021 with small IVH likely secondary to hypertension and Eliquis.  Repeat CT of the head 10/11/2021 showed fading blood products within the right thalamus.  Regional hypodensity persist in my reflect a combination of residual edema and developing myelomalacia.  Mild regional mass effect diminished since 10/02/2021.  No new intracranial abnormality.  Patient noted history of PAF at this time would remain off of Eliquis and follow-up with cardiology services Dr.Komado of [facility].  Cardiac rate remained controlled.  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    66 y.o. right-handed female with history of hypertension, recurrent UTIs maintained on prophylactic Macrodantin aortic insufficiency, PAF with history of ablation maintained on Eliquis.  Her latest cardiology notes that she was planning Xarelto to see if this may be more affordable for her however it was never started and she was maintained on Eliquis.  Per chart review patient lives alone.  Daughter in the area.  Presented 09/25/2021 with acute onset of left-sided weakness as well as blurred vision.  CT of the head showed hemorrhage centered in the right thalamus and basal ganglia with mild surrounding edema, minimal midline shift  and a small amount of intraventricular extension into the third and fourth ventricle.  Patient was reversed with Andexxa.  Follow-up MRI/MRA unchanged size of morphology of acute intraparenchymal hemorrhage.  No hydrocephalus or trapping.  There was a noted focal severe distal left P2 stenosis on MRA.  Admission chemistries unremarkable except potassium 3.2 urinalysis negative.  Echocardiogram with ejection fraction of 65 to 70% no wall motion abnormalities.  Patient did require short-term intubation through 09/27/2021.  Maintained on a mechanical soft nectar thick liquid diet and advance to thin liquids 10/01/2021.    Admit date: 10/01/2021 Discharge date: 11/06/2021 HPI 66 year old female with history of right thalamic hemorrhage.  She is living with her daughter.  She is getting outpatient PT and OT. No recent falls.  Still right requiring assistance for ADLs and mobility Spasticity at night has improved no longer taking tizanidine. 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  Her gabapentin dose was titrated upward starting at 100 mg and got as high as 600 mg however that dose was causing excess drowsiness.  She is now having adequate pain relief on gabapentin 400 mg 4 times daily Back on walker following fall at home ~49mo ago   Pain Inventory Average Pain 7 Pain Right Now 7 My pain is sharp and stabbing  LOCATION OF PAIN  hand,foot,fingers,buttocks  BOWEL Number of stools per week: 3  Type of laxative Dulcolax  BLADDER Normal    Mobility use a cane use a walker ability to climb steps?  no do you drive?  no use a wheelchair transfers alone Do you have any goals in this area?  yes  Function retired I need assistance  with the following:  bathing and shopping Do you have any goals in this area?  yes  Neuro/Psych trouble walking  Prior Studies Any changes since last visit?  no  Physicians involved in your care Any changes since last visit?   no   History reviewed. No pertinent family history. Social History   Socioeconomic History   Marital status: Single    Spouse name: Not on file   Number of children: 1   Years of education: Not on file   Highest education level: Not on file  Occupational History   Not on file  Tobacco Use   Smoking status: Never   Smokeless tobacco: Never  Vaping Use   Vaping Use: Never used  Substance and Sexual Activity   Alcohol use: Not Currently    Alcohol/week: 1.0 standard drink of alcohol    Types: 1 Glasses of wine per week   Drug use: Never   Sexual activity: Not Currently  Other Topics Concern   Not on file  Social History Narrative   12/27/21 dgtr lives with her   Social Determinants of Health   Financial Resource Strain: Not on file  Food Insecurity: Not on file  Transportation Needs: Not on file  Physical Activity: Not on file  Stress: Not on file  Social Connections: Not on file   Past Surgical History:  Procedure Laterality Date   [place]  05/03/2021   heart   c section  01/1968   KNEE SURGERY Right    meniscus   SHOULDER SURGERY Right 08/2014   Past Medical History:  Diagnosis Date   A-fib [facility])    Aortic insufficiency    Hypertension    Stroke ([place])    Ht 5\' 6"  (1.676 m)   BMI 28.57 kg/m   Opioid Risk Score:   Fall Risk Score:  `1  Depression screen [facility] 2/9     04/11/2022   12:08 PM 02/19/2022    2:01 PM 12/04/2021    1:22 PM  Depression screen PHQ 2/9  Decreased Interest 0 0 0  Down, Depressed, Hopeless 0 0 0  PHQ - 2 Score 0 0 0  Altered sleeping   0  Tired, decreased energy   0  Change in appetite   0  Feeling bad or failure about yourself    0  Trouble concentrating   0  Moving slowly or fidgety/restless   0  Suicidal thoughts   0  PHQ-9 Score   0      Review of Systems  Musculoskeletal:        Left foot pain Hand/Finger pain Buttock pain  All other systems reviewed and are negative.      Objective:   Physical Exam  No acute distress Mood and affect are appropriate Speech without dysarthria no evidence of left neglect or field cut Motor strength is 3/5 in the left deltoid bicep tricep grip 4 at the left hip flexor knee extensor trace ankle plantar flexor 0 ankle dorsiflexor 0 foot inversion. Tone MAS 1 at the left elbow flexor wrist and finger flexors MAS 1 at the left hamstring there is foot inverters spasticity left lower limb Sensation absent to light touch and proprioception left upper and left lower limb. No hyperesthesia in the left upper and left lower limb.      Assessment & Plan:   1.  History of right thalamic hemorrhage as well as internal capsule hemorrhage with resultant left hemisensory deficits  and left hemiparesis. Continue outpatient PT OT 2.  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

## 2022-04-17 ENCOUNTER — Ambulatory Visit: Payer: Medicare Other | Attending: Internal Medicine | Admitting: Physical Therapy

## 2022-04-17 ENCOUNTER — Ambulatory Visit: Payer: Medicare Other | Admitting: Occupational Therapy

## 2022-04-17 ENCOUNTER — Other Ambulatory Visit: Payer: Self-pay | Admitting: Physical Medicine & Rehabilitation

## 2022-04-17 DIAGNOSIS — R29818 Other symptoms and signs involving the nervous system: Secondary | ICD-10-CM

## 2022-04-17 DIAGNOSIS — R208 Other disturbances of skin sensation: Secondary | ICD-10-CM | POA: Diagnosis present

## 2022-04-17 DIAGNOSIS — I69318 Other symptoms and signs involving cognitive functions following cerebral infarction: Secondary | ICD-10-CM

## 2022-04-17 DIAGNOSIS — I69154 Hemiplegia and 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 left non-dominant side: Secondary | ICD-10-CM

## 2022-04-17 DIAGNOSIS — M6281 Muscle weakness (generalized): Secondary | ICD-10-CM

## 2022-04-17 DIAGNOSIS — R278 Other lack of coordination: Secondary | ICD-10-CM | POA: Insufficient documentation

## 2022-04-17 DIAGNOSIS — R2689 Other abnormalities of gait and mobility: Secondary | ICD-10-CM | POA: Insufficient documentation

## 2022-04-17 DIAGNOSIS — R2681 Unsteadiness on feet: Secondary | ICD-10-CM | POA: Diagnosis present

## 2022-04-17 NOTE — Therapy (Signed)
OUTPATIENT OCCUPATIONAL THERAPY NEURO TREATMENT  Patient Name: Charlotte Hunter MRN: [id number] DOB:July 10, 1955, 67 y.o., female Today's Date: 04/18/2022  PCP: Su Grand, MD REFERRING PROVIDER: Bayard Hugger, NP    OT End of Session - 04/18/22 0945     Visit Number 14    Number of Visits 25    Date for OT Re-Evaluation 05/07/22    Authorization Type MCR, BC/BS    Authorization - Visit Number 14    Progress Note Due on Visit 20    OT Start Time 1440    OT Stop Time 1520    OT Time Calculation (min) 40 min    Activity Tolerance Patient tolerated treatment well    Behavior During Therapy [facility] for tasks assessed/performed;Anxious                Past Medical History:  Diagnosis Date   A-fib [facility])    Aortic insufficiency    Hypertension    Stroke [facility])    Past Surgical History:  Procedure Laterality Date   ABDOMINAL HYSTERECTOMY     ABLATION  05/03/2021   heart   c section  01/1968   KNEE SURGERY Right    meniscus   SHOULDER SURGERY Right 08/2014   Patient Active Problem List   Diagnosis Date Noted   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 09/25/2021    ONSET DATE: 12/14/2021 Referral date  REFERRING DIAG: I61.9 (ICD-10-CM) - Hemorrhagic stroke ([place])   THERAPY DIAG:  Hemiplegia and 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 left non-dominant side ([name])  Muscle weakness (generalized)  Other lack of coordination  Other symptoms and signs involving cognitive functions following cerebral infarction  Other disturbances of skin sensation  Other symptoms and signs involving the nervous system  Rationale for  Evaluation and Treatment Rehabilitation  SUBJECTIVE:   SUBJECTIVE STATEMENT: Pt reports pain in hand Pt accompanied by: self   PERTINENT HISTORY: 67 year old female here for evaluation of right thalamic intracerebral hemorrhage. Patient was admitted to the hospital in September 25, 2021 for new onset left-sided weakness headache and nausea. She was found to have intracerebral hemorrhage in the right thalamus. Patient was on Eliquis anticoagulation for atrial fibrillation at the time and this was reversed medically. She was started on hypertonic saline, blood pressure control in ICU admission. Patient went to inpatient rehabilitation. Then pt had HH therapies. Still has left-sided weakness and left hand and arm and leg pain. Has been to cardiology who recommended to hold anticoagulation for now as patient is not in atrial fibrillation.    Past medical history of atrial fibrillation on Eliquis aortic insufficiency, HTN, fibromyalgia  PRECAUTIONS: Fall and Other: NO lifting > 14 lbs, no driving  WEIGHT BEARING RESTRICTIONS: No  PAIN:  Are you having pain? Yes: NPRS scale: 8/10 Pain location: Lt hand and wrist Pain description: burning, constant  Aggravating factors: cold weather Relieving factors: meds, voltaren gel  FALLS: Has patient fallen in last 6 months? No  LIVING ENVIRONMENT: Lives with: daughter, [name] Lives in: House/apartment Stairs: No - Ramp has been built at front entrance Has following equipment at home: w/c, quad cane large base, Walker - 2 wheeled, and bed side commode  PLOF: Independent; pt did gardening prior to  CVA - was not employed prior to onset of CVA   PATIENT GOALS: get my hand better, return to gardening  OBJECTIVE: from eval  HAND DOMINANCE: Right  ADLs: Eating: dependent for cutting food, mod I eating Grooming: mod I  UB Dressing: min assist w/ sports bra and getting shirt overhead LB Dressing: min assist w/ Lt shoe (d/t brace) and tying  shoes Toileting: uses BSC - min assist for clothes management Bathing: seated - w/ min to assist for back, bottom, and feet Tub Shower transfers: grab bar, ? Tub transfer bench for shower (w/ high thresh hold) - close sup to CGA  IADLs: Shopping: daughter performing now Light housekeeping: daughter performing now Meal Prep: pt can heat up things in microwave, make sandwich, however daughter cooking Community mobility: relies on daughter or friend for transportation Medication management: does w/ pillbox and strategies Financial management: mod I  Handwriting:  denies change (difficulty stabilizing check w/ Lt hand)   MOBILITY STATUS:  primarily uses w/c, sup using walker shorter distances   UPPER EXTREMITY ROM:  RUE AROM WNL's.  LUE: Pt has approx 60* sh flexion w/ no control and compensations into abd, IR and forearm pronation. Pt has approx 75% ER, 50% IR. Full elbow flex, 75% elbow ext and supination, wrist WFL's, 90% finger flexion    HAND FUNCTION: Grip strength: Right: 68.1 lbs; Left: 14.5 lbs  COORDINATION: Box & Blocks Lt = 7,  9 hole peg unable LUE Pt able to pick up pen Lt hand but unable to manipulate  SENSATION: Light touch: absent except slight detection more proximal hand - unable to localize. Absent in fingertips Proprioception: Impaired    MUSCLE TONE: LUE: dystonia   COGNITION: Overall cognitive status:  inconsistent w/ comprehension of questions during eval, short term memory deficits  VISION: Subjective report: diplopia resolved Baseline vision: Bifocals Visual history:  none  VISION ASSESSMENT: Not tested  Patient has difficulty with following activities due to following visual impairments: Pt reports some blurriness w/ reading     TODAY'S TREATMENT: Seated closed chain shoulder flexion holding boomwacker to reach for floor for gentle stretch to bilateral shoulders then bringing back to lap  UE ranger for AA/ROM  in standing for shoulder  flexion, circumduction, min facilitation/ v.c Pt was instructed in putty exercises for LUE as sh is requesting more activities that she can perform with LUE, pt required min-mod v.c and demonstrates min-mod difficulty due to sensory deficits.   PATIENT EDUCATION: Education details: yellow putty exercises, see pt instructions Person educated: Patient Education method: Explanation, Demonstration, Tactile cues, Verbal cues, and Handouts Education comprehension: verbalized understanding, returned demonstration, and verbal cues required       HOME EXERCISE PROGRAM: 02/12/22: safety considerations d/t lack of sensation Lt hand, functional tasks to perform w/ Lt hand, shoulder HEP, and A/E recommendations (bath mitt, hands free blow dryer holder)    GOALS: Goals reviewed with patient? Yes  SHORT TERM GOALS: Target date: 03/09/22  Independent with initial HEP  Baseline: Goal status: MET  2.  Pt to cut food w/ A/E prn and report greater ease opening peel containers (yogurts, deli meat) Baseline:  Goal status: MET  3.  Pt to improve LUE functional use by demo improvement on Box & Blocks to 13 or more Baseline: 7 blocks Goal status: IN PROGRESS (03/11/22: 8 blocks)   4.  Pt to perform UB dressing mod I level Baseline:  Goal status: MET  5.  Pt to perform low level functional  reaching w/ min compensations to grasp/release 1" objects Baseline:  Goal status: MET  6.  Pt to consistently perform clothes management w/ min assist from LUE w/o LOB Baseline:  Goal status: IN PROGRESS - slips from Lt hand  7.  Pt to verbalize understanding of safety strategies d/t lack of sensation Lt hand Baseline:  Goal status: MET   LONG TERM GOALS: Target date: 05/07/22  Independent w/ updated HEP  Baseline:  Goal status: INITIAL  2.  Pt to improve LUE function as evidenced by performing Box & Blocks to 18 or more Baseline: 7 blocks Goal status: INITIAL  3.  Pt to improve [place] hand as  evidenced by performing 9 hole peg test in under 2 min Baseline: unable Goal status: INITIAL  4.  Pt to be mod I for all ADLS (except sup for shower transfers)  Baseline:  Goal status: INITIAL  5.  Pt to perform 70* sh flexion for functional light reaching with min compensations LUE Baseline:  Goal status: INITIAL  6.  Grip strength Lt hand to be 20 lbs or greater Baseline: 14.5 lbs Goal status: INITIAL  7.  Pt will be mod I for gardening task w/ DME/AE prn Baseline:  Goal status: INITIAL   ASSESSMENT:  CLINICAL IMPRESSION: Pt is progressing towards goals. She reports that her UE pain has improved since MD adjusted her gabapentin dose. Pt remains limited by LUE sensory deficits.    PERFORMANCE DEFICITS: in functional skills including ADLs, IADLs, coordination, dexterity, proprioception, sensation, tone, ROM, strength, pain, Fine motor control, Gross motor control, mobility, balance, endurance, decreased knowledge of use of DME, and UE functional use, cognitive skills including memory, problem solving, and safety awareness.   IMPAIRMENTS: are limiting patient from ADLs, IADLs, leisure, and social participation.   CO-MORBIDITIES; may have co-morbidities  that affects occupational performance. Patient will benefit from skilled OT to address above impairments and improve overall function.  MODIFICATION OR ASSISTANCE TO COMPLETE EVALUATION: Min-Moderate modification of tasks or assist with assess necessary to complete an evaluation.  OT OCCUPATIONAL PROFILE AND HISTORY: Problem focused assessment: Including review of records relating to presenting problem.  CLINICAL DECISION MAKING: Moderate - several treatment options, min-mod task modification necessary  REHAB POTENTIAL: Good  EVALUATION COMPLEXITY: Low    PLAN:  OT FREQUENCY: 2x/week  OT DURATION: 12 weeks  PLANNED INTERVENTIONS: self care/ADL training, therapeutic exercise, therapeutic activity, neuromuscular  re-education, passive range of motion, functional mobility training, aquatic therapy, splinting, fluidotherapy, moist heat, patient/family education, cognitive remediation/compensation, visual/perceptual remediation/compensation, coping strategies training, and DME and/or AE instructions  RECOMMENDED OTHER SERVICES: will monitor for speech eval needs if cognition deficits impeding ADLS  CONSULTED AND AGREED WITH PLAN OF CARE: Patient and family member/caregiver  PLAN FOR NEXT SESSION:  continue neuro re-education LUE/trunk, consider weightbearing activities, modified quadraped, prone ,functional activities LUE     04/18/22 9:47 AM Phone [phone] FAX [phone]

## 2022-04-17 NOTE — Patient Instructions (Addendum)
1. Grip Strengthening (Resistive Putty)   Squeeze putty using thumb and all fingers. Repeat _10-20__ times. Do __1__ sessions per day.   2. Roll putty into tube on table and pinch between first two fingers and thumb x 10 reps. Do 2 sessions per day     Finger / Thumb Activities: Extension    Roll putty into rope shape using all fingers held straight. Hitchhike with thumb up and out.  Pinch: Palmar    Pinch putty with right thumb and index and middle finger Repeat _10___ times. Do __1__ sessions per day.   Copyright  VHI. All rights reserved.

## 2022-04-17 NOTE — Therapy (Signed)
OUTPATIENT PHYSICAL THERAPY NEURO TREATMENT NOTE       Patient Name: Charlotte Hunter MRN: [id number] DOB:12-05-1955, 67 y.o., female Today's Date: 04/17/2022   PCP: Su Grand, MD REFERRING PROVIDER: Bayard Hugger, NP     PT End of Session - 04/17/22 1458     Visit Number 29    Number of Visits 28   renewal completed for 16 additional visits   Date for PT Re-Evaluation 05/31/22    Authorization Type Medicare    Authorization Time Period 01-03-22 - 04-03-22;  04-01-22 - 06-13-22    Progress Note Due on Visit 30    PT Start Time 1455   pt late   PT Stop Time 1530    PT Time Calculation (min) 35 min    Equipment Utilized During Treatment Gait belt;Other (comment)   pt wearing her AFO, RW   Activity Tolerance Patient tolerated treatment well    Behavior During Therapy [facility] for tasks assessed/performed;Anxious                                    Past Medical History:  Diagnosis Date   A-fib [facility])    Aortic insufficiency    Hypertension    Stroke 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 09/25/2021    ONSET DATE: 09-25-21  REFERRING DIAG: I61.9 (ICD-10-CM) - Hemorrhagic stroke ([place])   THERAPY DIAG:  Hemiplegia and 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 left non-dominant side ([name])  Other abnormalities of gait and mobility  Muscle  weakness (generalized)  Unsteadiness on feet  Other lack of coordination  Rationale for Evaluation and Treatment Rehabilitation  SUBJECTIVE:                                                                                                                                                                                              SUBJECTIVE STATEMENT: Pt reports her pain has been better since she started taking  gabapentin, ongoing soreness in her L buttock. Pt reports she has been working on walking at home and performing her HEP as well as doing laundry and taking dishes in/out of dishwasher. Pt also reports she has been able to walk across the carpet in her bedroom with her RW four times.  Pt accompanied by: friend (in lobby)  PERTINENT HISTORY: 67 year old female here for evaluation of right thalamic intracerebral hemorrhage. Patient is was admitted to the hospital in June 2023 for new onset left-sided weakness headache and nausea. She was found to have intracerebral hemorrhage in the right thalamus. Patient was on Eliquis anticoagulation for atrial fibrillation at the time and this was reversed medically. She was started on hypertonic saline, blood pressure control in ICU admission. Patient went to inpatient rehabilitation. Now patient back home. Still has left-sided weakness and left hand and arm and leg pain. Has been to cardiology who recommended to hold anticoagulation for now as patient is not in atrial fibrillation.   Past medical history of atrial fibrillation on Eliquis aortic insufficiency, hypertension, fibromyalgia  PAIN:  Are you having pain? Yes Location: Lt side of face & chin, edge of Lt arm (lateral side), foot and ankle and Lt buttock  Intensity: 1-2/10  Nerve pain, occasional shock sensation, "rope-like sensation";   04-09-22: not so much in pain but more numbness in Lt side of face - states numbness is moving out    PRECAUTIONS: Fall  WEIGHT BEARING RESTRICTIONS  No  FALLS: Has patient fallen in last 6 months? No  LIVING ENVIRONMENT: Lives with: daughter, [name] Lives in: House/apartment Stairs: No - Ramp has been built at front entrance Has following equipment at home: Programmer, multimedia, Environmental consultant - 2 wheeled, and bed side commode  PLOF: Independent; pt did gardening prior to CVA - was not employed prior to onset of CVA  PATIENT GOALS "be restored back to where I was"   OBJECTIVE:     GAIT: Gait pattern:  mild LLE internal rotation, step through pattern, decreased hip/knee flexion- Left, and genu recurvatum- Left Distance walked: 115 ft x 2 Assistive device utilized: Walker - 2 wheeled Level of assistance: CGA Comments: onset of R hip pain/soreness during gait, resolves at rest and with Voltarin gel; ongoing IR of L foot during gait but improved following stretching   THER EX: Seated LLE hamstring and hip IR/ER stretch prior to initiating gait to decrease amount of hip IR during gait    TherEx:  Access Code: [place] - issued on 01-17-22 URL: [email or web address] Date: 01/17/2022 Prepared by: Ethelene Browns  Exercises - Seated Soleus Stretch  - 2-3 x daily - 7 x weekly - 1 sets - 2 reps - 15-20 secs  hold - Slant Board Gastrocnemius Stretch  - 3 x daily - 7 x weekly - 1 sets - 2 reps - 15-20 secs hold - Seated Gastroc Stretch with Strap  - 2-3 x daily - 7 x weekly - 1 sets - 2 reps - 15-20 hold   PATIENT EDUCATION:   Education details: continue to practice gait with RW at home despite ongoing anxiety and fear of falling Person educated: Patient Education method: Explanation, Demonstration Education comprehension: verbalized understanding, demonstration   HOME EXERCISE PROGRAM:  Previously issued Access Code: [email or web address] URL: [email or web address] Date: 01/08/2022 Prepared by: Ethelene Browns  Exercises - Bridge  - 1 x daily - 7 x weekly - 1 sets - 10 reps - 5 hold - Single Leg Bridge  - 1 x daily -  7 x weekly - 1 sets - 10 reps - 3 sec hold - Hooklying Clamshell with Resistance  - 1 x daily - 7 x weekly - 1 sets - 10 reps - 2-3 sec hold - Supine Hip Flexion with Resistance Loop  - 1 x daily - 7 x weekly - 3 sets - 10 reps - Supine Heel Slide  - 1 x daily - 7 x weekly - 3 sets - 10 reps - Stepping out/in with LEFT leg  - 1 x daily - 7 x weekly - 3 sets - 10 reps - Hooklying Hamstring Stretch with Strap  - 1 x daily - 7 x weekly - 1 sets - 1-2 reps - 20 sec hold  GOALS: Goals reviewed with patient? Yes   NEW SHORT TERM GOALS:  TARGET DATE 05-03-22   Perform basic transfers with supervision from wheelchair to/from mat toward Rt or Lt side. Baseline:  CGA toward Lt side; SBA toward Rt side Goal status:  NEW  2.  Amb. 230' with RW with SBA on flat, even surface to demo improved endurance.  Baseline:  47' with RW with CGA on 04-01-22  Goal status:  Ongoing  3. Pt will amb. From lobby into/out of clinic with RW with CGA.  Baseline:  currently using wheelchair  Goal status:  NEW  4.  Improve Berg score to >/= 37/56 to demo improved balance.  Baseline:  16/56 on 01-18-22; 31/56 on 02-28-22;  33/56 on 04-01-22  Goal status: NEW  5.  Perform step negotiation with use of Rt hand rail with CGA (4 steps) using step by step sequence.  Baseline:  min assist  Goal status:  Ongoing    6.  Assess TUG with RW. Baseline:  2" with LBQC Goal status:  NEW  7.  Pt will report decreased pain on Lt side of body to </= 4/10 after aquatic therapy. Baseline:  8/10 Goal status:  NEW   NEW LONG TERM GOALS:  TARGET DATE:  05-31-22  Modified independent household amb. With RW.  Baseline: CGA with RW with household ambulation Goal status: Ongoing  2.  Pt will increase Berg balance test score to >/= 41/56 to reduce fall risk.  Baseline:  33/56 on 04-01-22 Goal status: NEW  3.  Amb. 350' with RW with SBA for increased community accessibility.  Baseline:  Goal status: Ongoing - 62' with RW with  CGA  4.  Negotiate 4 steps with use of handrail with SBA using step by step sequence. Baseline:  Goal status: ONGOING  5.  Pt will improve TUG score by at least 30 secs with RW to demo improved functional mobility and reduced fall risk.  Baseline:  Goal status: ONGOING  6.  Pt will negotiate ramp and curb with RW with SBA for increased community accessibility.  Baseline:  Goal status: ONGOING  ASSESSMENT:  CLINICAL IMPRESSION: Emphasis of skilled PT session on stretching of LLE followed by gait training. Pt exhibits decreased IR of LLE during gait following stretching, does have ongoing genu recurvatum during gait as well as anxiety limiting her ability to complete higher challenge-level tasks during therapy sessions. Session also limited by pt's late arrival. Pt continues to benefit from skilled therapy services to address ongoing L hemibody weakness leading to decreased balance and decreased safety and independence with functional mobility. Continue POC.    OBJECTIVE IMPAIRMENTS Abnormal gait, decreased activity tolerance, decreased balance, decreased coordination, decreased endurance, decreased knowledge of use of DME, decreased ROM, decreased strength,  impaired sensation, impaired tone, and impaired UE functional use.   ACTIVITY LIMITATIONS carrying, lifting, bending, standing, squatting, stairs, transfers, bed mobility, and locomotion level  PARTICIPATION LIMITATIONS: meal prep, cleaning, laundry, driving, shopping, community activity, and yard work  PERSONAL FACTORS Fitness and severity of deficits  are also affecting patient's functional outcome.   REHAB POTENTIAL: Good  CLINICAL DECISION MAKING: Evolving/moderate complexity  EVALUATION COMPLEXITY: Moderate  PLAN: PT FREQUENCY: 2x/week for 8 weeks  PT DURATION: 8 weeks  PLANNED INTERVENTIONS: Therapeutic exercises, Therapeutic activity, Neuromuscular re-education, Balance training, Gait training, Patient/Family  education, Self Care, Stair training, Orthotic/Fit training, DME instructions, Aquatic Therapy, and Wheelchair mobility training  PLAN FOR NEXT SESSION: cont gait train with RW - try to increase distance as tolerated - 1 lap has been furthest distance to date due to c/o Rt hip discomfort:   LLE NMR and knee control; standing balance activities; starting aquatic therapy on 04-22-21   Peter Congo, PT, DPT, CSRS  [facility] [address] [place], [place] [address] 04/17/2022, 3:53 PM

## 2022-04-18 ENCOUNTER — Encounter: Payer: Self-pay | Admitting: Occupational Therapy

## 2022-04-22 ENCOUNTER — Ambulatory Visit: Payer: Medicare Other | Admitting: Physical Therapy

## 2022-04-22 ENCOUNTER — Ambulatory Visit: Payer: Medicare Other | Admitting: Occupational Therapy

## 2022-04-22 DIAGNOSIS — I69154 Hemiplegia and 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 left non-dominant side: Secondary | ICD-10-CM

## 2022-04-22 DIAGNOSIS — M6281 Muscle weakness (generalized): Secondary | ICD-10-CM

## 2022-04-22 DIAGNOSIS — R2689 Other abnormalities of gait and mobility: Secondary | ICD-10-CM

## 2022-04-22 DIAGNOSIS — R2681 Unsteadiness on feet: Secondary | ICD-10-CM

## 2022-04-23 ENCOUNTER — Encounter: Payer: Self-pay | Admitting: Physical Therapy

## 2022-04-23 NOTE — Therapy (Signed)
OUTPATIENT PHYSICAL THERAPY NEURO TREATMENT NOTE/10th Visit Progress Note   Progress Note Reporting Period 03-11-22 to 04-22-22  See note below for Objective Data and Assessment of Progress/Goals.        Patient Name: Charlotte Hunter MRN: [id number] DOB:1955-12-26, 67 y.o., female Today's Date: 04/23/2022   PCP: Su Grand, MD REFERRING PROVIDER: Bayard Hugger, NP     PT End of Session - 04/23/22 1030     Visit Number 30    Number of Visits 62   renewal completed for 16 additional visits   Date for PT Re-Evaluation 05/31/22    Authorization Type Medicare    Authorization Time Period 01-03-22 - 04-03-22;  04-01-22 - 06-13-22    Progress Note Due on Visit 30    PT Start Time 1408    PT Stop Time 1450    PT Time Calculation (min) 42 min    Equipment Utilized During Treatment --   pt brought her own air cast:  used water walker, aquatic step   Activity Tolerance Patient tolerated treatment well    Behavior During Therapy WFL for tasks assessed/performed;Anxious                                    Past Medical History:  Diagnosis Date   A-fib [facility])    Aortic insufficiency    Hypertension    Stroke [facility])    Past Surgical History:  Procedure Laterality Date   ABDOMINAL HYSTERECTOMY     ABLATION  05/03/2021   heart   c section  01/1968   KNEE SURGERY Right    meniscus   SHOULDER SURGERY Right 08/2014   Patient Active Problem List   Diagnosis Date Noted   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   ICH (intracerebral hemorrhage) ([place]) 10/01/2021   Paroxysmal atrial fibrillation ([name]) 09/29/2021   Stenosis of intracranial vessel 09/29/2021   Essential hypertension 09/29/2021   Blurry vision 09/29/2021   GERD (gastroesophageal reflux disease) 09/29/2021   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 09/25/2021    ONSET DATE: 09-25-21  REFERRING DIAG: I61.9 (ICD-10-CM) - Hemorrhagic stroke ([name])   THERAPY DIAG:  Hemiplegia and  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 left non-dominant side ([name])  Muscle weakness (generalized)  Other abnormalities of gait and mobility  Unsteadiness on feet  Rationale for Evaluation and Treatment Rehabilitation  SUBJECTIVE:  SUBJECTIVE STATEMENT: Pt presents for aquatic therapy - accompanied by her daughter; pt has brought her air cast which she has purchased to use in the pool since AFO will not be used   Pt accompanied by: [name], Charlotte Hunter  PERTINENT HISTORY: 67 year old female here for evaluation of right thalamic intracerebral hemorrhage. Patient is was admitted to the hospital in June 2023 for new onset left-sided weakness headache and nausea. She was found to have intracerebral hemorrhage in the right thalamus. Patient was on Eliquis anticoagulation for atrial fibrillation at the time and this was reversed medically. She was started on hypertonic saline, blood pressure control in ICU admission. Patient went to inpatient rehabilitation. Now patient back home. Still has left-sided weakness and left hand and arm and leg pain. Has been to cardiology who recommended to hold anticoagulation for now as patient is not in atrial fibrillation.   Past medical history of atrial fibrillation on Eliquis aortic insufficiency, hypertension, fibromyalgia  PAIN:  Are you having pain? Yes Location: Lt side of face & chin, edge of Lt arm (lateral side), foot and ankle and Lt buttock  Intensity: 1-2/10  Nerve pain, occasional shock sensation, "rope-like sensation";   04-09-22: not so much in pain but more numbness in Lt side of face - states numbness is moving out    PRECAUTIONS: Fall  WEIGHT BEARING RESTRICTIONS No  FALLS: Has patient fallen in last 6 months? No  LIVING  ENVIRONMENT: Lives with: daughter, Charlotte Hunter Lives in: House/apartment Stairs: No - Ramp has been built at front entrance Has following equipment at home: Chiropractor, Environmental consultant - 2 wheeled, and bed side commode  PLOF: Independent; pt did gardening prior to CVA - was not employed prior to onset of CVA  PATIENT GOALS "be restored back to where I was"   OBJECTIVE:    Aquatic therapy at [facility] - pool temp 92 degrees  Patient seen for aquatic therapy today.  Treatment took place in water 3.5-4.5 feet deep depending upon activity.  Pt entered & exited the pool via chair lift;  pt transferred from wheelchair to chair lift toward Rt side - initially positioned wheelchair next to chair lift but pt requested not to transfer that way due to having to transfer toward her left side (involved side).  Wheelchair was positioned to facing chair lift - pt transferred using squat pivot transfer toward Rt side.  Air cast was donned on her LLE with water shoes also donned.   Pt performed water walking forward 18' across pool with use of water walker - Lt foot noted to plantarflex and supinate due to increased tone;  Pt required min to mod assist during start of session to flex/pronate Lt foot to position in neutral position as air cast did not fully control supination Lt foot due to increased tone. Water walker was removed due to pt not feeling stable enough - changed to pt using RUE support on pool wall; pt amb. Approx. 15' x 4 reps with min assist with correct positioning of Lt foot in neutral - slow gait speed due to increased time needed for Lt foot positioning  Pt performed tap ups onto aquatic step with RLE for LLE weight bearing and closed chain strengthening - Lt knee blocked to prevent hyperextension; pt performed 10 reps tap ups with RLE with min assist for balance with RUE support  Pt requires buoyancy of water for support for joint unloading and for reduced fall risk with gait training without  device than can  be safely performed on land. Water current produces perturbations for challenge for static & dynamic standing balance.  Buoyancy of water provides unweighting which assists with pain reduction.    TherEx:  Access Code: [email or web address] - issued on 01-17-22 URL: [email or web address] Date: 01/17/2022 Prepared by: Maebelle Munroe  Exercises - Seated Soleus Stretch  - 2-3 x daily - 7 x weekly - 1 sets - 2 reps - 15-20 secs  hold - Slant Board Gastrocnemius Stretch  - 3 x daily - 7 x weekly - 1 sets - 2 reps - 15-20 secs hold - Seated Gastroc Stretch with Strap  - 2-3 x daily - 7 x weekly - 1 sets - 2 reps - 15-20 hold   PATIENT EDUCATION:   Education details: continue to practice gait with RW at home despite ongoing anxiety and fear of falling Person educated: Patient Education method: Explanation, Demonstration Education comprehension: verbalized understanding, demonstration   HOME EXERCISE PROGRAM:  Previously issued Access Code: [email or web address] URL: [email or web address] Date: 01/08/2022 Prepared by: Maebelle Munroe  Exercises - Bridge  - 1 x daily - 7 x weekly - 1 sets - 10 reps - 5 hold - Single Leg Bridge  - 1 x daily - 7 x weekly - 1 sets - 10 reps - 3 sec hold - Hooklying Clamshell with Resistance  - 1 x daily - 7 x weekly - 1 sets - 10 reps - 2-3 sec hold - Supine Hip Flexion with Resistance Loop  - 1 x daily - 7 x weekly - 3 sets - 10 reps - Supine Heel Slide  - 1 x daily - 7 x weekly - 3 sets - 10 reps - Stepping out/in with LEFT leg  - 1 x daily - 7 x weekly - 3 sets - 10 reps - Hooklying Hamstring Stretch with Strap  - 1 x daily - 7 x weekly - 1 sets - 1-2 reps - 20 sec hold  GOALS: Goals reviewed with patient? Yes  Goals assessed 04-01-22  Modified independent household amb. With RW.  Baseline: CGA with RW with household ambulation Goal status: Ongoing - not fully met  2.  Pt will increase Berg balance test score to >/= 41/56 to reduce fall  risk.  Baseline:  33/56 on 04-01-22 Goal status: NEW  3.  Amb. 350' with RW with SBA for increased community accessibility.  Baseline:  Goal status: Ongoing - 4' with RW with CGA  4.  Negotiate 4 steps with use of handrail with SBA using step by step sequence. Baseline:  Goal status: ONGOING  5.  Pt will improve TUG score by at least 30 secs with RW to demo improved functional mobility and reduced fall risk.  Baseline:  Goal status: ONGOING  6.  Pt will negotiate ramp and curb with RW with SBA for increased community accessibility.  Baseline:  Goal status: ONGOING   NEW SHORT TERM GOALS:  TARGET DATE 05-03-22   Perform basic transfers with supervision from wheelchair to/from mat toward Rt or Lt side. Baseline:  CGA toward Lt side; SBA toward Rt side Goal status:  NEW  2.  Amb. 230' with RW with SBA on flat, even surface to demo improved endurance.  Baseline:  73' with RW with CGA on 04-01-22  Goal status:  Ongoing  3. Pt will amb. From lobby into/out of clinic with RW with CGA.  Baseline:  currently using wheelchair  Goal status:  NEW  4.  Improve Berg score to >/= 37/56 to demo  improved balance.  Baseline:  16/56 on 01-18-22; 31/56 on 02-28-22;  33/56 on 04-01-22  Goal status: NEW  5.  Perform step negotiation with use of Rt hand rail with CGA (4 steps) using step by step sequence.  Baseline:  min assist  Goal status:  Ongoing    6.  Assess TUG with RW. Baseline:  2" with [facility] Goal status:  NEW  7.  Pt will report decreased pain on Lt side of body to </= 4/10 after aquatic therapy. Baseline:  8/10 Goal status:  NEW   NEW LONG TERM GOALS:  TARGET DATE:  05-31-22  Modified independent household amb. With RW.  Baseline: CGA with RW with household ambulation Goal status: Ongoing  2.  Pt will increase Berg balance test score to >/= 41/56 to reduce fall risk.  Baseline:  33/56 on 04-01-22 Goal status: NEW  3.  Amb. 350' with RW with SBA for increased community  accessibility.  Baseline:  Goal status: Ongoing - 31' with RW with CGA  4.  Negotiate 4 steps with use of handrail with SBA using step by step sequence. Baseline:  Goal status: ONGOING  5.  Pt will improve TUG score by at least 30 secs with RW to demo improved functional mobility and reduced fall risk.  Baseline:  Goal status: ONGOING  6.  Pt will negotiate ramp and curb with RW with SBA for increased community accessibility.  Baseline:  Goal status: ONGOING  ASSESSMENT:  CLINICAL IMPRESSION:   This 10th visit progress note covers dates 03-11-22 - 04-22-22.  Pt has not fully met any STG's but is slowly making progress with use of RW for assistance with ambulation.  Berg balance test score has increased from 16/56 on 01-18-22 to 33/56 on 04-01-22.  Pt has just started participation in aquatic therapy.  Progress has been limited by c/o nerve pain on Lt side of body, with pt wearing Lidocaine patches on Lt hip/buttock region and also on Lt foot.  Continue POC.    OBJECTIVE IMPAIRMENTS Abnormal gait, decreased activity tolerance, decreased balance, decreased coordination, decreased endurance, decreased knowledge of use of DME, decreased ROM, decreased strength, impaired sensation, impaired tone, and impaired UE functional use.   ACTIVITY LIMITATIONS carrying, lifting, bending, standing, squatting, stairs, transfers, bed mobility, and locomotion level  PARTICIPATION LIMITATIONS: meal prep, cleaning, laundry, driving, shopping, community activity, and yard work  PERSONAL FACTORS Fitness and severity of deficits  are also affecting patient's functional outcome.   REHAB POTENTIAL: Good  CLINICAL DECISION MAKING: Evolving/moderate complexity  EVALUATION COMPLEXITY: Moderate  PLAN: PT FREQUENCY: 2x/week for 8 weeks  PT DURATION: 8 weeks  PLANNED INTERVENTIONS: Therapeutic exercises, Therapeutic activity, Neuromuscular re-education, Balance training, Gait training, Patient/Family  education, Self Care, Stair training, Orthotic/Fit training, DME instructions, Aquatic Therapy, and Wheelchair mobility training  PLAN FOR NEXT SESSION:   Cont with gait training with RW; balance exercises/LLE SLS, strengthening exercises   [place], Donavan Burnet, PT  [facility] [address] [place], [place] [address] 04/23/2022, 11:23 AM

## 2022-04-24 ENCOUNTER — Ambulatory Visit: Payer: Medicare Other | Admitting: Physical Therapy

## 2022-04-24 ENCOUNTER — Ambulatory Visit: Payer: Medicare Other | Admitting: Occupational Therapy

## 2022-04-24 DIAGNOSIS — R2681 Unsteadiness on feet: Secondary | ICD-10-CM

## 2022-04-24 DIAGNOSIS — I69154 Hemiplegia and 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 left non-dominant side: Secondary | ICD-10-CM | POA: Diagnosis not present

## 2022-04-24 DIAGNOSIS — M6281 Muscle weakness (generalized): Secondary | ICD-10-CM

## 2022-04-24 DIAGNOSIS — R2689 Other abnormalities of gait and mobility: Secondary | ICD-10-CM

## 2022-04-24 NOTE — Therapy (Signed)
OUTPATIENT PHYSICAL THERAPY NEURO TREATMENT NOTE          Patient Name: Naila Elizondo MRN: [id number] DOB:11-21-1955, 67 y.o., female Today's Date: 04/24/2022   PCP: Audery Amel, MD REFERRING PROVIDER: Jones Bales, NP     PT End of Session - 04/24/22 1455     Visit Number 31    Number of Visits 52   renewal completed for 16 additional visits   Date for PT Re-Evaluation 05/31/22    Authorization Type Medicare    Authorization Time Period 01-03-22 - 04-03-22;  04-01-22 - 06-13-22    Progress Note Due on Visit 30    PT Start Time 1450   this therapist ran over with previous patient   PT Stop Time 1532    PT Time Calculation (min) 42 min    Equipment Utilized During Treatment Gait belt   RW, L AFO   Activity Tolerance Patient tolerated treatment well    Behavior During Therapy [facility] for tasks assessed/performed;Anxious                                     Past Medical History:  Diagnosis Date   A-fib [facility])    Aortic insufficiency    Hypertension    Stroke [facility])    Past Surgical History:  Procedure Laterality Date   ABDOMINAL HYSTERECTOMY     ABLATION  05/03/2021   heart   c section  01/1968   KNEE SURGERY Right    meniscus   SHOULDER SURGERY Right 08/2014   Patient Active Problem List   Diagnosis Date Noted   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   ICH (intracerebral hemorrhage) ([name]) 10/01/2021   Paroxysmal atrial fibrillation ([name]) 09/29/2021   Stenosis of intracranial vessel 09/29/2021   Essential hypertension 09/29/2021   Blurry vision 09/29/2021   GERD (gastroesophageal reflux disease) 09/29/2021   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 09/25/2021    ONSET DATE: 09-25-21  REFERRING DIAG: I61.9 (ICD-10-CM) - Hemorrhagic stroke ([name])   THERAPY DIAG:  Hemiplegia and 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 left non-dominant side ([name])  Muscle weakness  (generalized)  Unsteadiness on feet  Other abnormalities of gait and mobility  Rationale for Evaluation and Treatment Rehabilitation  SUBJECTIVE:                                                                                                                                                                                              SUBJECTIVE STATEMENT: Pt reports she has been up and walking her RW  at home by herself and is doing well with that, even walking across the carpet in/out of her bedroom multiple times per day. Pt reports she has been able to stand at the stove to prepare meals and her daughter takes them in/out of the oven. Pt reports she really enjoyed pool therapy but feels like her L ankle was "rolling" a lot. Pt reports she is having burning pain in her L ankle and reports it feels swollen, Lidocaine cream is not effective for this pain.  Pt accompanied by: [name], [name]  PERTINENT HISTORY: 67 year old female here for evaluation of right thalamic intracerebral hemorrhage. Patient is was admitted to the hospital in June 2023 for new onset left-sided weakness headache and nausea. She was found to have intracerebral hemorrhage in the right thalamus. Patient was on Eliquis anticoagulation for atrial fibrillation at the time and this was reversed medically. She was started on hypertonic saline, blood pressure control in ICU admission. Patient went to inpatient rehabilitation. Now patient back home. Still has left-sided weakness and left hand and arm and leg pain. Has been to cardiology who recommended to hold anticoagulation for now as patient is not in atrial fibrillation.   Past medical history of atrial fibrillation on Eliquis aortic insufficiency, hypertension, fibromyalgia  PAIN:  Are you having pain? Yes Location: Lt side of face & chin, edge of Lt arm (lateral side), foot and ankle and Lt buttock  Intensity: 1-2/10  Nerve pain, occasional shock sensation, "rope-like  sensation";   04-09-22: not so much in pain but more numbness in Lt side of face - states numbness is moving out    PRECAUTIONS: Fall  WEIGHT BEARING RESTRICTIONS No  FALLS: Has patient fallen in last 6 months? No  LIVING ENVIRONMENT: Lives with: daughter, [name] Lives in: House/apartment Stairs: No - Ramp has been built at front entrance Has following equipment at home: Programmer, multimedia, Environmental consultant - 2 wheeled, and bed side commode  PLOF: Independent; pt did gardening prior to CVA - was not employed prior to onset of CVA  PATIENT GOALS "be restored back to where I was"   OBJECTIVE:   THER EX: Seated L ankle stretches on BAPs board prior to gait training this session. Initially with DF/PF and inversion/eversion with transition to circles. Pt needs assist to stabilize limb on board as well as to perform movements through full ROM.  GAIT: Gait pattern: decreased stance time- Right, decreased hip/knee flexion- Left, and genu recurvatum- Left Distance walked: 115 ft Assistive device utilized: Walker - 2 wheeled Level of assistance: Modified independence Comments: increased gait speed this date despite ongoing L ankle pain  Gait across uneven mat (red mat) on floor with RW and min A for balance, lifting of RW on/off mat with min A for standing balance but no assist needed for RW management. Pt initially anxious with this task but able to perform x 2 reps with min A needed at most for balance.  Encouraged pt to ambulate back to the car at end of her session, pt reports feeling so fatigued that she would not even be able to ambulate to the clinic lobby. Pt encouraged to attempt to ambulate back to the car after next session in clinic.   TherEx:  Access Code: [email or web address] - issued on 01-17-22 URL: [email or web address] Date: 01/17/2022 Prepared by: Ethelene Browns  Exercises - Seated Soleus Stretch  - 2-3 x daily - 7 x weekly - 1 sets - 2 reps - 15-20 secs  hold - Slant  Board  Gastrocnemius Stretch  - 3 x daily - 7 x weekly - 1 sets - 2 reps - 15-20 secs hold - Seated Gastroc Stretch with Strap  - 2-3 x daily - 7 x weekly - 1 sets - 2 reps - 15-20 hold   PATIENT EDUCATION:   Education details: continue to practice gait with RW at home despite ongoing anxiety and fear of falling Person educated: Patient Education method: Explanation, Demonstration Education comprehension: verbalized understanding, demonstration   HOME EXERCISE PROGRAM:  Previously issued Access Code: [email or web address] URL: [email or web address] Date: 01/08/2022 Prepared by: Ethelene Browns  Exercises - Bridge  - 1 x daily - 7 x weekly - 1 sets - 10 reps - 5 hold - Single Leg Bridge  - 1 x daily - 7 x weekly - 1 sets - 10 reps - 3 sec hold - Hooklying Clamshell with Resistance  - 1 x daily - 7 x weekly - 1 sets - 10 reps - 2-3 sec hold - Supine Hip Flexion with Resistance Loop  - 1 x daily - 7 x weekly - 3 sets - 10 reps - Supine Heel Slide  - 1 x daily - 7 x weekly - 3 sets - 10 reps - Stepping out/in with LEFT leg  - 1 x daily - 7 x weekly - 3 sets - 10 reps - Hooklying Hamstring Stretch with Strap  - 1 x daily - 7 x weekly - 1 sets - 1-2 reps - 20 sec hold  GOALS: Goals reviewed with patient? Yes  Goals assessed 04-01-22  Modified independent household amb. With RW.  Baseline: CGA with RW with household ambulation Goal status: Ongoing - not fully met  2.  Pt will increase Berg balance test score to >/= 41/56 to reduce fall risk.  Baseline:  33/56 on 04-01-22 Goal status: NEW  3.  Amb. 350' with RW with SBA for increased community accessibility.  Baseline:  Goal status: Ongoing - 104' with RW with CGA  4.  Negotiate 4 steps with use of handrail with SBA using step by step sequence. Baseline:  Goal status: ONGOING  5.  Pt will improve TUG score by at least 30 secs with RW to demo improved functional mobility and reduced fall risk.  Baseline:  Goal status: ONGOING  6.  Pt  will negotiate ramp and curb with RW with SBA for increased community accessibility.  Baseline:  Goal status: ONGOING   NEW SHORT TERM GOALS:  TARGET DATE 05-03-22   Perform basic transfers with supervision from wheelchair to/from mat toward Rt or Lt side. Baseline:  CGA toward Lt side; SBA toward Rt side Goal status:  NEW  2.  Amb. 230' with RW with SBA on flat, even surface to demo improved endurance.  Baseline:  2' with RW with CGA on 04-01-22  Goal status:  Ongoing  3. Pt will amb. From lobby into/out of clinic with RW with CGA.  Baseline:  currently using wheelchair  Goal status:  NEW  4.  Improve Berg score to >/= 37/56 to demo improved balance.  Baseline:  16/56 on 01-18-22; 31/56 on 02-28-22;  33/56 on 04-01-22  Goal status: NEW  5.  Perform step negotiation with use of Rt hand rail with CGA (4 steps) using step by step sequence.  Baseline:  min assist  Goal status:  Ongoing    6.  Assess TUG with RW. Baseline:  2" with [facility] Goal status:  NEW  7.  Pt will report decreased  pain on Lt side of body to </= 4/10 after aquatic therapy. Baseline:  8/10 Goal status:  NEW   NEW LONG TERM GOALS:  TARGET DATE:  05-31-22  Modified independent household amb. With RW.  Baseline: CGA with RW with household ambulation Goal status: Ongoing  2.  Pt will increase Berg balance test score to >/= 41/56 to reduce fall risk.  Baseline:  33/56 on 04-01-22 Goal status: NEW  3.  Amb. 350' with RW with SBA for increased community accessibility.  Baseline:  Goal status: Ongoing - 49' with RW with CGA  4.  Negotiate 4 steps with use of handrail with SBA using step by step sequence. Baseline:  Goal status: ONGOING  5.  Pt will improve TUG score by at least 30 secs with RW to demo improved functional mobility and reduced fall risk.  Baseline:  Goal status: ONGOING  6.  Pt will negotiate ramp and curb with RW with SBA for increased community accessibility.  Baseline:  Goal status:  ONGOING  ASSESSMENT:  CLINICAL IMPRESSION: Emphasis of skilled PT session on initiating session with L ankle stretching due to increase in pain this date followed by ambulation across level surface and compliant surface with RW. Pt exhibits increased gait speed across level surface this date with ongoing LLE gait deviations as noted above. Pt exhibits some hesitancy initially with gait across compliant surface but exhibits good balance and safe RW management with this task. Pt continues to benefit from skilled therapy services to address ongoing L hemibody weakness leading to decreased balance and increased fall risk. Continue POC.     OBJECTIVE IMPAIRMENTS Abnormal gait, decreased activity tolerance, decreased balance, decreased coordination, decreased endurance, decreased knowledge of use of DME, decreased ROM, decreased strength, impaired sensation, impaired tone, and impaired UE functional use.   ACTIVITY LIMITATIONS carrying, lifting, bending, standing, squatting, stairs, transfers, bed mobility, and locomotion level  PARTICIPATION LIMITATIONS: meal prep, cleaning, laundry, driving, shopping, community activity, and yard work  PERSONAL FACTORS Fitness and severity of deficits  are also affecting patient's functional outcome.   REHAB POTENTIAL: Good  CLINICAL DECISION MAKING: Evolving/moderate complexity  EVALUATION COMPLEXITY: Moderate  PLAN: PT FREQUENCY: 2x/week for 8 weeks  PT DURATION: 8 weeks  PLANNED INTERVENTIONS: Therapeutic exercises, Therapeutic activity, Neuromuscular re-education, Balance training, Gait training, Patient/Family education, Self Care, Stair training, Orthotic/Fit training, DME instructions, Aquatic Therapy, and Wheelchair mobility training  PLAN FOR NEXT SESSION:   Cont with gait training with RW; balance exercises/LLE SLS, strengthening exercises, try gait in clinic with [facility] again to work on pt's confidence with this device   Peter Congo, PT,  DPT, CSRS  [facility] [address] [place], [place] [address] 04/24/2022, 3:43 PM

## 2022-04-29 ENCOUNTER — Ambulatory Visit: Payer: Medicare Other | Admitting: Occupational Therapy

## 2022-04-29 ENCOUNTER — Ambulatory Visit: Payer: Medicare Other | Admitting: Physical Therapy

## 2022-05-01 ENCOUNTER — Ambulatory Visit: Payer: Medicare Other | Admitting: Physical Therapy

## 2022-05-01 ENCOUNTER — Ambulatory Visit: Payer: Medicare Other | Admitting: Occupational Therapy

## 2022-05-01 ENCOUNTER — Encounter: Payer: Self-pay | Admitting: Occupational Therapy

## 2022-05-01 DIAGNOSIS — M6281 Muscle weakness (generalized): Secondary | ICD-10-CM

## 2022-05-01 DIAGNOSIS — R2681 Unsteadiness on feet: Secondary | ICD-10-CM

## 2022-05-01 DIAGNOSIS — I69154 Hemiplegia and 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 left non-dominant side: Secondary | ICD-10-CM | POA: Diagnosis not present

## 2022-05-01 DIAGNOSIS — R2689 Other abnormalities of gait and mobility: Secondary | ICD-10-CM

## 2022-05-01 DIAGNOSIS — R208 Other disturbances of skin sensation: Secondary | ICD-10-CM

## 2022-05-01 DIAGNOSIS — R278 Other lack of coordination: Secondary | ICD-10-CM

## 2022-05-01 NOTE — Therapy (Signed)
OUTPATIENT PHYSICAL THERAPY NEURO TREATMENT NOTE          Patient Name: Charlotte Hunter MRN: [id number] DOB:1955/10/14, 67 y.o., female Today's Date: 05/01/2022   PCP: Audery Amel, MD REFERRING PROVIDER: Jones Bales, NP     PT End of Session - 05/01/22 1403     Visit Number 32    Number of Visits 52   renewal completed for 16 additional visits   Date for PT Re-Evaluation 05/31/22    Authorization Type Medicare    Authorization Time Period 01-03-22 - 04-03-22;  04-01-22 - 06-13-22    Progress Note Due on Visit 30    PT Start Time 1403   handoff from OT session   PT Stop Time 1444    PT Time Calculation (min) 41 min    Equipment Utilized During Treatment Gait belt   RW, L AFO   Activity Tolerance Patient tolerated treatment well    Behavior During Therapy [facility] for tasks assessed/performed;Anxious                                      Past Medical History:  Diagnosis Date   A-fib [facility])    Aortic insufficiency    Hypertension    Stroke [facility])    Past Surgical History:  Procedure Laterality Date   ABDOMINAL HYSTERECTOMY     ABLATION  05/03/2021   heart   c section  01/1968   KNEE SURGERY Right    meniscus   SHOULDER SURGERY Right 08/2014   Patient Active Problem List   Diagnosis Date Noted   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   ICH (intracerebral hemorrhage) ([name]) 10/01/2021   Paroxysmal atrial fibrillation ([name]) 09/29/2021   Stenosis of intracranial vessel 09/29/2021   Essential hypertension 09/29/2021   Blurry vision 09/29/2021   GERD (gastroesophageal reflux disease) 09/29/2021   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 09/25/2021    ONSET DATE: 09-25-21  REFERRING DIAG: I61.9 (ICD-10-CM) - Hemorrhagic stroke ([name])   THERAPY DIAG:  Hemiplegia and 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 left non-dominant side ([name])  Muscle weakness (generalized)  Unsteadiness on  feet  Other abnormalities of gait and mobility  Rationale for Evaluation and Treatment Rehabilitation  SUBJECTIVE:                                                                                                                                                                                              SUBJECTIVE STATEMENT: Pt reports she had severe pain Friday-Tuesday but it is better today.  Pt reports she can now feel more with her L hand and foot (can feel textures, etc). Pt is experiencing cyclical pain in her L that gets worse then gets better, gabapentin and lidocaine cream are helpful for managing pain. Pt reports she has been walking with her RW at home by herself and feels comfortable with that.  Pt accompanied by: [name], [name]  PERTINENT HISTORY: 67 year old female here for evaluation of right thalamic intracerebral hemorrhage. Patient is was admitted to the hospital in June 2023 for new onset left-sided weakness headache and nausea. She was found to have intracerebral hemorrhage in the right thalamus. Patient was on Eliquis anticoagulation for atrial fibrillation at the time and this was reversed medically. She was started on hypertonic saline, blood pressure control in ICU admission. Patient went to inpatient rehabilitation. Now patient back home. Still has left-sided weakness and left hand and arm and leg pain. Has been to cardiology who recommended to hold anticoagulation for now as patient is not in atrial fibrillation.   Past medical history of atrial fibrillation on Eliquis aortic insufficiency, hypertension, fibromyalgia  PAIN:  Are you having pain? Yes Location: Lt side of face & chin, edge of Lt arm (lateral side), foot and ankle and Lt buttock  Intensity: 7.5/10  Nerve pain, occasional shock sensation, "rope-like sensation";   04-09-22: not so much in pain but more numbness in Lt side of face - states numbness is moving out    PRECAUTIONS: Fall  WEIGHT BEARING  RESTRICTIONS No  FALLS: Has patient fallen in last 6 months? No  LIVING ENVIRONMENT: Lives with: daughter, [name] Lives in: House/apartment Stairs: No - Ramp has been built at front entrance Has following equipment at home: Chiropractor, Environmental consultant - 2 wheeled, and bed side commode  PLOF: Independent; pt did gardening prior to CVA - was not employed prior to onset of CVA  PATIENT GOALS "be restored back to where I was"   OBJECTIVE:   THER EX: Seated L ankle stretches on BAPs board prior to gait training this session. Initially with DF/PF and inversion/eversion with transition to circles. Pt needs assist to stabilize limb on board as well as to perform movements through full ROM.   GAIT: Gait pattern: decreased hip/knee flexion- Left and genu recurvatum- Left Distance walked: 115 ft Assistive device utilized: Walker - 2 wheeled Level of assistance: SBA Comments: R hip pain/soreness (applied lidocaine cream following gait)  Gait pattern: step to pattern, decreased hip/knee flexion- Left, circumduction- Left, and genu recurvatum- Left Distance walked: 40 ft Assistive device utilized: Quad cane large base Level of assistance: CGA Comments: initiation of gait with LBQC for the first time since pt's fall at home; pt with good memory for gait pattern; encouraged pt to work on gait with her SBQC at home again as she feels more confident  Pt agreeable to ambulate back to main entrance/lobby area at end of session. However, upon initiation of gait pt has onset of sharp R knee pain, declines to ambulate any further at this point.   TherEx:  Access Code: [email or web address] - issued on 01-17-22 URL: [email or web address] Date: 01/17/2022 Prepared by: Maebelle Munroe  Exercises - Seated Soleus Stretch  - 2-3 x daily - 7 x weekly - 1 sets - 2 reps - 15-20 secs  hold - Slant Board Gastrocnemius Stretch  - 3 x daily - 7 x weekly - 1 sets - 2 reps - 15-20 secs hold - Seated Gastroc  Stretch with Strap  - 2-3  x daily - 7 x weekly - 1 sets - 2 reps - 15-20 hold   PATIENT EDUCATION:   Education details: continue to practice gait with RW at home and even work with [facility] if feeling comfortable Person educated: Patient Education method: Consulting civil engineer, Demonstration Education comprehension: verbalized understanding, demonstration   HOME EXERCISE PROGRAM:  Previously issued Access Code: [email or web address] URL: [email or web address] Date: 01/08/2022 Prepared by: Ethelene Browns  Exercises - Bridge  - 1 x daily - 7 x weekly - 1 sets - 10 reps - 5 hold - Single Leg Bridge  - 1 x daily - 7 x weekly - 1 sets - 10 reps - 3 sec hold - Hooklying Clamshell with Resistance  - 1 x daily - 7 x weekly - 1 sets - 10 reps - 2-3 sec hold - Supine Hip Flexion with Resistance Loop  - 1 x daily - 7 x weekly - 3 sets - 10 reps - Supine Heel Slide  - 1 x daily - 7 x weekly - 3 sets - 10 reps - Stepping out/in with LEFT leg  - 1 x daily - 7 x weekly - 3 sets - 10 reps - Hooklying Hamstring Stretch with Strap  - 1 x daily - 7 x weekly - 1 sets - 1-2 reps - 20 sec hold  GOALS: Goals reviewed with patient? Yes  Goals assessed 04-01-22  Modified independent household amb. With RW.  Baseline: CGA with RW with household ambulation Goal status: Ongoing - not fully met  2.  Pt will increase Berg balance test score to >/= 41/56 to reduce fall risk.  Baseline:  33/56 on 04-01-22 Goal status: NEW  3.  Amb. 350' with RW with SBA for increased community accessibility.  Baseline:  Goal status: Ongoing - 38' with RW with CGA  4.  Negotiate 4 steps with use of handrail with SBA using step by step sequence. Baseline:  Goal status: ONGOING  5.  Pt will improve TUG score by at least 30 secs with RW to demo improved functional mobility and reduced fall risk.  Baseline:  Goal status: ONGOING  6.  Pt will negotiate ramp and curb with RW with SBA for increased community accessibility.  Baseline:   Goal status: ONGOING   NEW SHORT TERM GOALS:  TARGET DATE 05-03-22   Perform basic transfers with supervision from wheelchair to/from mat toward Rt or Lt side. Baseline:  CGA toward Lt side; SBA toward Rt side Goal status:  NEW  2.  Amb. 230' with RW with SBA on flat, even surface to demo improved endurance.  Baseline:  7' with RW with CGA on 04-01-22  Goal status:  Ongoing  3. Pt will amb. From lobby into/out of clinic with RW with CGA.  Baseline:  currently using wheelchair  Goal status:  NEW  4.  Improve Berg score to >/= 37/56 to demo improved balance.  Baseline:  16/56 on 01-18-22; 31/56 on 02-28-22;  33/56 on 04-01-22  Goal status: NEW  5.  Perform step negotiation with use of Rt hand rail with CGA (4 steps) using step by step sequence.  Baseline:  min assist  Goal status:  Ongoing    6.  Assess TUG with RW. Baseline:  2" with LBQC Goal status:  NEW  7.  Pt will report decreased pain on Lt side of body to </= 4/10 after aquatic therapy. Baseline:  8/10 Goal status:  NEW   NEW LONG TERM GOALS:  TARGET DATE:  05-31-22  Modified independent household amb. With RW.  Baseline: CGA with RW with household ambulation Goal status: Ongoing  2.  Pt will increase Berg balance test score to >/= 41/56 to reduce fall risk.  Baseline:  33/56 on 04-01-22 Goal status: NEW  3.  Amb. 350' with RW with SBA for increased community accessibility.  Baseline:  Goal status: Ongoing - 63' with RW with CGA  4.  Negotiate 4 steps with use of handrail with SBA using step by step sequence. Baseline:  Goal status: ONGOING  5.  Pt will improve TUG score by at least 30 secs with RW to demo improved functional mobility and reduced fall risk.  Baseline:  Goal status: ONGOING  6.  Pt will negotiate ramp and curb with RW with SBA for increased community accessibility.  Baseline:  Goal status: ONGOING  ASSESSMENT:  CLINICAL IMPRESSION: Emphasis of skilled PT session on returning to  gait training with [facility] to assess pt's balance as well as confidence level with returning to a less-restrictive AD. Initiated session with L ankle stretching per pt request as she felt like this was helpful last session. Also worked on gait with RW prior to initiating gait with [facility] to improved pt's confidence level. Pt exhibits good memory for safe [facility] management and gait pattern. Encouraged pt to return to working on gait with her [facility] at home based on her comfort level. Continue POC.   OBJECTIVE IMPAIRMENTS Abnormal gait, decreased activity tolerance, decreased balance, decreased coordination, decreased endurance, decreased knowledge of use of DME, decreased ROM, decreased strength, impaired sensation, impaired tone, and impaired UE functional use.   ACTIVITY LIMITATIONS carrying, lifting, bending, standing, squatting, stairs, transfers, bed mobility, and locomotion level  PARTICIPATION LIMITATIONS: meal prep, cleaning, laundry, driving, shopping, community activity, and yard work  Prosser and severity of deficits  are also affecting patient's functional outcome.   REHAB POTENTIAL: Good  CLINICAL DECISION MAKING: Evolving/moderate complexity  EVALUATION COMPLEXITY: Moderate  PLAN: PT FREQUENCY: 2x/week for 8 weeks  PT DURATION: 8 weeks  PLANNED INTERVENTIONS: Therapeutic exercises, Therapeutic activity, Neuromuscular re-education, Balance training, Gait training, Patient/Family education, Self Care, Stair training, Orthotic/Fit training, DME instructions, Aquatic Therapy, and Wheelchair mobility training  PLAN FOR NEXT SESSION:   Cont with gait training with QC (LB vs SB); balance exercises/LLE SLS, strengthening exercises, STG assess during next land visit   Excell Seltzer, PT, DPT, [place] [address]. [place], [place] [address] 05/01/2022, 2:45 PM

## 2022-05-01 NOTE — Therapy (Signed)
OUTPATIENT OCCUPATIONAL THERAPY NEURO TREATMENT  Patient Name: Charlotte Hunter MRN: [id number] DOB:Aug 13, 1955, 67 y.o., female Today's Date: 05/01/2022  PCP: Audery Amel, MD REFERRING PROVIDER: Jones Bales, NP    OT End of Session - 05/01/22 1324     Visit Number 15    Number of Visits 25    Date for OT Re-Evaluation 05/07/22    Authorization Type MCR, BC/BS    Authorization - Visit Number 15    Progress Note Due on Visit 20    OT Start Time 1320    OT Stop Time 1400    OT Time Calculation (min) 40 min    Activity Tolerance Patient tolerated treatment well    Behavior During Therapy [facility] for tasks assessed/performed;Anxious                Past Medical History:  Diagnosis Date   A-fib [facility])    Aortic insufficiency    Hypertension    Stroke [facility])    Past Surgical History:  Procedure Laterality Date   ABDOMINAL HYSTERECTOMY     ABLATION  05/03/2021   heart   c section  01/1968   KNEE SURGERY Right    meniscus   SHOULDER SURGERY Right 08/2014   Patient Active Problem List   Diagnosis Date Noted   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   ICH (intracerebral hemorrhage) ([name]) 10/01/2021   Paroxysmal atrial fibrillation ([name]) 09/29/2021   Stenosis of intracranial vessel 09/29/2021   Essential hypertension 09/29/2021   Blurry vision 09/29/2021   GERD (gastroesophageal reflux disease) 09/29/2021   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 Pt accompanied by: self   PERTINENT HISTORY: 67 year old female here for evaluation of right thalamic intracerebral hemorrhage. Patient was admitted to the hospital in September 25, 2021 for new onset left-sided weakness headache and nausea. She was found to have intracerebral hemorrhage in the right thalamus. Patient was on Eliquis anticoagulation for atrial fibrillation at the time and this was reversed medically. She was started on hypertonic saline, blood pressure control in ICU admission. Patient went to inpatient rehabilitation. Then pt had HH therapies. Still has left-sided weakness and left hand and arm and leg pain. Has been to cardiology who recommended to hold anticoagulation for now as patient is not in atrial fibrillation.    Past medical history of atrial fibrillation on Eliquis aortic insufficiency, HTN, fibromyalgia  PRECAUTIONS: Fall and Other: NO lifting > 14 lbs, no driving  WEIGHT BEARING RESTRICTIONS: No  PAIN:  Are you having pain? Yes: NPRS scale: 7/10 Pain location: Lt shoulder, Lt torso, hand and wrist in spots  Pain description: burning, constant  Aggravating factors: cold weather Relieving factors: meds, voltaren gel  FALLS: Has patient fallen in last 6 months? No  LIVING ENVIRONMENT: Lives with: daughter, [name] Lives in: House/apartment Stairs: No - Ramp has been built at front entrance Has following equipment at home: w/c, quad cane large base, Walker - 2 wheeled, and bed side commode  PLOF: Independent; pt did gardening prior  to CVA - was not employed prior to onset of CVA   PATIENT GOALS: get my hand better, return to gardening  OBJECTIVE: from eval  HAND DOMINANCE: Right  ADLs: Eating: dependent for cutting food, mod I eating Grooming: mod I  UB Dressing: min assist w/ sports bra and getting shirt overhead LB Dressing: min assist w/ Lt shoe (d/t brace) and tying shoes Toileting: uses BSC - min assist for clothes  management Bathing: seated - w/ min to assist for back, bottom, and feet Tub Shower transfers: grab bar, ? Tub transfer bench for shower (w/ high thresh hold) - close sup to CGA  IADLs: Shopping: daughter performing now Light housekeeping: daughter performing now Meal Prep: pt can heat up things in microwave, make sandwich, however daughter cooking Community mobility: relies on daughter or friend for transportation Medication management: does w/ pillbox and strategies Financial management: mod I  Handwriting:  denies change (difficulty stabilizing check w/ Lt hand)   MOBILITY STATUS:  primarily uses w/c, sup using walker shorter distances   UPPER EXTREMITY ROM:  RUE AROM WNL's.  LUE: Pt has approx 60* sh flexion w/ no control and compensations into abd, IR and forearm pronation. Pt has approx 75% ER, 50% IR. Full elbow flex, 75% elbow ext and supination, wrist WFL's, 90% finger flexion    HAND FUNCTION: Grip strength: Right: 68.1 lbs; Left: 14.5 lbs  COORDINATION: Box & Blocks Lt = 7,  9 hole peg unable LUE Pt able to pick up pen Lt hand but unable to manipulate  SENSATION: Light touch: absent except slight detection more proximal hand - unable to localize. Absent in fingertips Proprioception: Impaired    MUSCLE TONE: LUE: dystonia   COGNITION: Overall cognitive status:  inconsistent w/ comprehension of questions during eval, short term memory deficits  VISION: Subjective report: diplopia resolved Baseline vision: Bifocals Visual history:  none  VISION ASSESSMENT: Not tested  Patient has difficulty with following activities due to following visual impairments: Pt reports some blurriness w/ reading     TODAY'S TREATMENT: Pt reports using Lt hand to help fold clothes and squeeze washcloth out. Pt reports she has more control of LUE  BUE AA/ROM in sh flexion along inclined surface w/ dowel. Followed by LUE AA/ROM along slightly inclined surface.  Pt simulating  drinking bringing cup up to mouth and back to table with min assist distally.   LUE AA/ROM in closed chain sh flexion using UE Ranger  Flipping large cards over Lt hand w/ min drops and difficulty. Noted thumb IP hyperflexion, therefore issued Oval 8 to prevent thumb from going into flexion compensations and was able to flip cards w/ greater ease.     PATIENT EDUCATION: Education details: yellow putty exercises, see pt instructions Person educated: Patient Education method: Explanation, Demonstration, Tactile cues, Verbal cues, and Handouts Education comprehension: verbalized understanding, returned demonstration, and verbal cues required       HOME EXERCISE PROGRAM: 02/12/22: safety considerations d/t lack of sensation Lt hand, functional tasks to perform w/ Lt hand, shoulder HEP, and A/E recommendations (bath mitt, hands free blow dryer holder)    GOALS: Goals reviewed with patient? Yes  SHORT TERM GOALS: Target date: 03/09/22  Independent with initial HEP  Baseline: Goal status: MET  2.  Pt to cut food w/ A/E prn and report greater ease opening peel containers (yogurts, deli meat) Baseline:  Goal status: MET  3.  Pt to improve LUE functional use by demo improvement on Box & Blocks  to 13 or more Baseline: 7 blocks Goal status: IN PROGRESS (03/11/22: 8 blocks)   4.  Pt to perform UB dressing mod I level Baseline:  Goal status: MET  5.  Pt to perform low level functional reaching w/ min compensations to grasp/release 1" objects Baseline:  Goal status: MET  6.  Pt to consistently perform clothes management w/ min assist from LUE w/o LOB Baseline:  Goal status: IN PROGRESS - slips from Lt hand  7.  Pt to verbalize understanding of safety strategies d/t lack of sensation Lt hand Baseline:  Goal status: MET   LONG TERM GOALS: Target date: 05/07/22  Independent w/ updated HEP  Baseline:  Goal status: INITIAL  2.  Pt to improve LUE function as evidenced by  performing Box & Blocks to 18 or more Baseline: 7 blocks Goal status: INITIAL  3.  Pt to improve [place] hand as evidenced by performing 9 hole peg test in under 2 min Baseline: unable Goal status: INITIAL  4.  Pt to be mod I for all ADLS (except sup for shower transfers)  Baseline:  Goal status: INITIAL  5.  Pt to perform 70* sh flexion for functional light reaching with min compensations LUE Baseline:  Goal status: INITIAL  6.  Grip strength Lt hand to be 20 lbs or greater Baseline: 14.5 lbs Goal status: INITIAL  7.  Pt will be mod I for gardening task w/ DME/AE prn Baseline:  Goal status: INITIAL   ASSESSMENT:  CLINICAL IMPRESSION: Pt is progressing towards goals. She reports that her UE pain has improved since MD adjusted her gabapentin dose. Pt remains limited by LUE sensory deficits.    PERFORMANCE DEFICITS: in functional skills including ADLs, IADLs, coordination, dexterity, proprioception, sensation, tone, ROM, strength, pain, Fine motor control, Gross motor control, mobility, balance, endurance, decreased knowledge of use of DME, and UE functional use, cognitive skills including memory, problem solving, and safety awareness.   IMPAIRMENTS: are limiting patient from ADLs, IADLs, leisure, and social participation.   CO-MORBIDITIES; may have co-morbidities  that affects occupational performance. Patient will benefit from skilled OT to address above impairments and improve overall function.  MODIFICATION OR ASSISTANCE TO COMPLETE EVALUATION: Min-Moderate modification of tasks or assist with assess necessary to complete an evaluation.  OT OCCUPATIONAL PROFILE AND HISTORY: Problem focused assessment: Including review of records relating to presenting problem.  CLINICAL DECISION MAKING: Moderate - several treatment options, min-mod task modification necessary  REHAB POTENTIAL: Good  EVALUATION COMPLEXITY: Low    PLAN:  OT FREQUENCY: 2x/week  OT DURATION: 12  weeks  PLANNED INTERVENTIONS: self care/ADL training, therapeutic exercise, therapeutic activity, neuromuscular re-education, passive range of motion, functional mobility training, aquatic therapy, splinting, fluidotherapy, moist heat, patient/family education, cognitive remediation/compensation, visual/perceptual remediation/compensation, coping strategies training, and DME and/or AE instructions  RECOMMENDED OTHER SERVICES: will monitor for speech eval needs if cognition deficits impeding ADLS  CONSULTED AND AGREED WITH PLAN OF CARE: Patient and family member/caregiver  PLAN FOR NEXT SESSION:  continue neuro re-education LUE/trunk, consider weightbearing activities, modified quadraped, prone ,functional activities LUE    Redmond Baseman, OTR/L 05/01/22 1:25 PM Phone [phone] FAX [phone]

## 2022-05-06 ENCOUNTER — Ambulatory Visit: Payer: Medicare Other | Admitting: Occupational Therapy

## 2022-05-06 ENCOUNTER — Encounter: Payer: Self-pay | Admitting: Occupational Therapy

## 2022-05-06 ENCOUNTER — Ambulatory Visit: Payer: Medicare Other | Admitting: Physical Therapy

## 2022-05-06 DIAGNOSIS — M6281 Muscle weakness (generalized): Secondary | ICD-10-CM

## 2022-05-06 DIAGNOSIS — R2681 Unsteadiness on feet: Secondary | ICD-10-CM

## 2022-05-06 DIAGNOSIS — I69154 Hemiplegia and 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 left non-dominant side: Secondary | ICD-10-CM

## 2022-05-06 DIAGNOSIS — R2689 Other abnormalities of gait and mobility: Secondary | ICD-10-CM

## 2022-05-06 DIAGNOSIS — R278 Other lack of coordination: Secondary | ICD-10-CM

## 2022-05-06 DIAGNOSIS — R208 Other disturbances of skin sensation: Secondary | ICD-10-CM

## 2022-05-06 NOTE — Therapy (Addendum)
OUTPATIENT OCCUPATIONAL THERAPY NEURO TREATMENT/RENEWAL  Patient Name: Charlotte Hunter MRN: [id number] DOB:1955/05/31, 67 y.o., female Today's Date: 05/06/2022  PCP: Audery Amel, MD REFERRING PROVIDER: Jones Bales, NP    OT End of Session - 05/06/22 1238     Visit Number 16    Number of Visits 33    Date for OT Re-Evaluation 07/05/22    Authorization Type MCR, BC/BS    Authorization - Number of Visits 16    Progress Note Due on Visit 20    OT Start Time 1230    OT Stop Time 1315    OT Time Calculation (min) 45 min    Activity Tolerance Patient tolerated treatment well    Behavior During Therapy [facility] for tasks assessed/performed;Anxious                Past Medical History:  Diagnosis Date   A-fib [facility])    Aortic insufficiency    Hypertension    Stroke [facility])    Past Surgical History:  Procedure Laterality Date   ABDOMINAL HYSTERECTOMY     ABLATION  05/03/2021   heart   c section  01/1968   KNEE SURGERY Right    meniscus   SHOULDER SURGERY Right 08/2014   Patient Active Problem List   Diagnosis Date Noted   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   ICH (intracerebral hemorrhage) ([name]) 10/01/2021   Paroxysmal atrial fibrillation ([name]) 09/29/2021   Stenosis of intracranial vessel 09/29/2021   Essential hypertension 09/29/2021   Blurry vision 09/29/2021   GERD (gastroesophageal reflux disease) 09/29/2021   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 Pt accompanied by: self   PERTINENT HISTORY: 67 year old female here for evaluation of right thalamic intracerebral hemorrhage. Patient was admitted to the hospital in September 25, 2021 for new onset left-sided weakness headache and nausea. She was found to have intracerebral hemorrhage in the right thalamus. Patient was on Eliquis anticoagulation for atrial fibrillation at the time and this was reversed medically. She was started on hypertonic saline, blood pressure control in ICU admission. Patient went to inpatient rehabilitation. Then pt had HH therapies. Still has left-sided weakness and left hand and arm and leg pain. Has been to cardiology who recommended to hold anticoagulation for now as patient is not in atrial fibrillation.    Past medical history of atrial fibrillation on Eliquis aortic insufficiency, HTN, fibromyalgia  PRECAUTIONS: Fall and Other: NO lifting > 14 lbs, no driving  WEIGHT BEARING RESTRICTIONS: No  PAIN:  Are you having pain? Yes: NPRS scale: 6/10 Pain location: Lt shoulder, Lt torso, hand and wrist in spots  Pain description: burning, constant  Aggravating factors: cold weather Relieving factors: meds, voltaren gel  FALLS: Has patient fallen in last 6 months? No  LIVING ENVIRONMENT: Lives with: daughter, [name] Lives in: House/apartment Stairs: No - Ramp has been built at front entrance Has following equipment at home: w/c, quad cane large base, Walker - 2 wheeled, and bed side commode  PLOF: Independent; pt did gardening prior to CVA - was not  employed prior to onset of CVA   PATIENT GOALS: get my hand better, return to gardening  OBJECTIVE: from eval  HAND DOMINANCE: Right  ADLs: Eating: dependent for cutting food, mod I eating Grooming: mod I  UB Dressing: min assist w/ sports bra and getting shirt overhead LB Dressing: min assist w/ Lt shoe (d/t brace) and tying shoes Toileting: uses BSC - min assist for clothes management Bathing: seated -  w/ min to assist for back, bottom, and feet Tub Shower transfers: grab bar, ? Tub transfer bench for shower (w/ high thresh hold) - close sup to CGA  IADLs: Shopping: daughter performing now Light housekeeping: daughter performing now Meal Prep: pt can heat up things in microwave, make sandwich, however daughter cooking Community mobility: relies on daughter or friend for transportation Medication management: does w/ pillbox and strategies Financial management: mod I  Handwriting:  denies change (difficulty stabilizing check w/ Lt hand)   MOBILITY STATUS:  primarily uses w/c, sup using walker shorter distances   UPPER EXTREMITY ROM:  RUE AROM WNL's.  LUE: Pt has approx 60* sh flexion w/ no control and compensations into abd, IR and forearm pronation. Pt has approx 75% ER, 50% IR. Full elbow flex, 75% elbow ext and supination, wrist WFL's, 90% finger flexion    HAND FUNCTION: Grip strength: Right: 68.1 lbs; Left: 14.5 lbs  COORDINATION: Box & Blocks Lt = 7,  9 hole peg unable LUE Pt able to pick up pen Lt hand but unable to manipulate  SENSATION: Light touch: absent except slight detection more proximal hand - unable to localize. Absent in fingertips Proprioception: Impaired    MUSCLE TONE: LUE: dystonia   COGNITION: Overall cognitive status:  inconsistent w/ comprehension of questions during eval, short term memory deficits  VISION: Subjective report: diplopia resolved Baseline vision: Bifocals Visual history:  none  VISION ASSESSMENT: Not tested  Patient has difficulty with following activities due to following visual impairments: Pt reports some blurriness w/ reading     TODAY'S TREATMENT: Reviewed and assessed progress towards goals for renewal period - see below  Discussed bed positioning and recommended not sleeping on Lt side d/t poor sensation  Supine: P/ROM in sh flex and abduction followed active chest press, followed by overhead sh flexion, back to  chest press with PVC frame, then progressed to holding foam roll w/ min assist to maintain proper hand placement Lt hand  Seated: using 3 point pincer grasp to pick up blocks and place in bowl w/ cues for forearm clearance as pt would knock over bowl multiple times    PATIENT EDUCATION: See above      HOME EXERCISE PROGRAM: 02/12/22: safety considerations d/t lack of sensation Lt hand, functional tasks to perform w/ Lt hand, shoulder HEP, and A/E recommendations (bath mitt, hands free blow dryer holder)    GOALS: Goals reviewed with patient? Yes  SHORT TERM GOALS: Target date: 03/09/22  Independent with initial HEP  Baseline: Goal status: MET  2.  Pt to cut food w/ A/E prn and report greater ease opening peel containers (yogurts, deli meat) Baseline:  Goal status: MET  3.  Pt to improve LUE functional use by demo improvement on Box & Blocks to 10 or more Baseline: 7 blocks Goal status: REVISED (03/11/22, 05/06/22: 8 blocks)   4.  Pt to perform UB dressing mod I level Baseline:  Goal status: MET  5.  Pt to perform low level functional reaching w/ min compensations to grasp/release 1" objects  Baseline:  Goal status: MET  6.  Pt to consistently perform clothes management w/ min assist from LUE w/o LOB Baseline:  Goal status: MET  7.  Pt to verbalize understanding of safety strategies d/t lack of sensation Lt hand Baseline:  Goal status: MET  8.  Pt will be able to hold cup in Lt hand consistently and wash under Rt arm better Baseline:  Goal status: INITIAL     LONG TERM GOALS: Target date: 05/07/22  Independent w/ updated HEP  Baseline:  Goal status: IN PROGRESS  2.  Pt to improve LUE function as evidenced by performing Box & Blocks to 13 or more Baseline: 7 blocks Goal status: REVISED  3.  Pt to improve [place] hand as evidenced by performing 9 hole peg test in under 2 min Baseline: unable Goal status: INITIAL  4.  Pt to be mod I for all ADLS (except  sup for shower transfers)  Baseline:  Goal status: MET  5.  Pt to perform 70* sh flexion for functional light reaching with min compensations LUE Baseline:  Goal status: IN PROGRESS (Approx 60*)  6.  Grip strength Lt hand to be 20 lbs or greater Baseline: 14.5 lbs Goal status: IN PROGRESS (18.5 LBS)   7.  Pt will be mod I for gardening task w/ DME/AE prn Baseline:  Goal status: INITIAL   ASSESSMENT:  CLINICAL IMPRESSION: Renewal completed today - see above progress towards goals. Will continue with progress towards revised, unmet, and new goals for renewal period.     PERFORMANCE DEFICITS: in functional skills including ADLs, IADLs, coordination, dexterity, proprioception, sensation, tone, ROM, strength, pain, Fine motor control, Gross motor control, mobility, balance, endurance, decreased knowledge of use of DME, and UE functional use, cognitive skills including memory, problem solving, and safety awareness.   IMPAIRMENTS: are limiting patient from ADLs, IADLs, leisure, and social participation.   CO-MORBIDITIES; may have co-morbidities  that affects occupational performance. Patient will benefit from skilled OT to address above impairments and improve overall function.  MODIFICATION OR ASSISTANCE TO COMPLETE EVALUATION: Min-Moderate modification of tasks or assist with assess necessary to complete an evaluation.  OT OCCUPATIONAL PROFILE AND HISTORY: Problem focused assessment: Including review of records relating to presenting problem.  CLINICAL DECISION MAKING: Moderate - several treatment options, min-mod task modification necessary  REHAB POTENTIAL: Good  EVALUATION COMPLEXITY: Low    PLAN:  OT FREQUENCY: 2x/week  OT DURATION: up to 8 additional weeks  PLANNED INTERVENTIONS: self care/ADL training, therapeutic exercise, therapeutic activity, neuromuscular re-education, passive range of motion, functional mobility training, aquatic therapy, splinting,  fluidotherapy, moist heat, patient/family education, cognitive remediation/compensation, visual/perceptual remediation/compensation, coping strategies training, and DME and/or AE instructions  RECOMMENDED OTHER SERVICES: will monitor for speech eval needs if cognition deficits impeding ADLS  CONSULTED AND AGREED WITH PLAN OF CARE: Patient and family member/caregiver  PLAN FOR NEXT SESSION:  continue neuro re-education LUE/trunk, consider weightbearing activities, modified quadraped, prone ,functional activities LUE    Redmond Baseman, OTR/L 05/06/22 12:40 PM Phone [phone] FAX [phone]

## 2022-05-06 NOTE — Addendum Note (Signed)
Addended by: Hans Eden on: 05/06/2022 02:12 PM   Modules accepted: Orders

## 2022-05-07 ENCOUNTER — Encounter: Payer: Self-pay | Admitting: Physical Therapy

## 2022-05-07 ENCOUNTER — Telehealth: Payer: Self-pay | Admitting: Physical Medicine & Rehabilitation

## 2022-05-07 MED ORDER — GABAPENTIN 400 MG PO CAPS: 400.0000 mg | ORAL_CAPSULE | Freq: Three times a day (TID) | ORAL | 0 refills | Status: DC

## 2022-05-07 MED ORDER — DULOXETINE HCL 20 MG PO CPEP: 20.0000 mg | ORAL_CAPSULE | Freq: Every day | ORAL | 1 refills | Status: DC

## 2022-05-07 NOTE — Addendum Note (Signed)
Addended by: Jasmine December T on: 05/07/2022 02:32 PM   Modules accepted: Orders

## 2022-05-07 NOTE — Therapy (Signed)
OUTPATIENT PHYSICAL THERAPY NEURO TREATMENT NOTE     Patient Name: Charlotte Hunter MRN: [id number] DOB:December 01, 1955, 67 y.o., female Today's Date: 05/07/2022   PCP: Su Grand, MD REFERRING PROVIDER: Bayard Hugger, NP     PT End of Session - 05/07/22 1427     Visit Number 33    Number of Visits 4   renewal completed for 16 additional visits   Date for PT Re-Evaluation 05/31/22    Authorization Type Medicare    Authorization Time Period 01-03-22 - 04-03-22;  04-01-22 - 06-13-22    Progress Note Due on Visit 34    PT Start Time 1405    PT Stop Time 1450    PT Time Calculation (min) 45 min    Equipment Utilized During Treatment Other (comment)   pt wore her air cast on LLE, used water walker   Activity Tolerance Patient tolerated treatment well    Behavior During Therapy WFL for tasks assessed/performed                                    Past Medical History:  Diagnosis Date   A-fib [facility])    Aortic insufficiency    Hypertension    Stroke [facility])    Past Surgical History:  Procedure Laterality Date   ABDOMINAL HYSTERECTOMY     ABLATION  05/03/2021   heart   c section  01/1968   KNEE SURGERY Right    meniscus   SHOULDER SURGERY Right 08/2014   Patient Active Problem List   Diagnosis Date Noted   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   ICH (intracerebral hemorrhage) ([place]) 10/01/2021   Paroxysmal atrial fibrillation ([place]) 09/29/2021   Stenosis of intracranial vessel 09/29/2021   Essential hypertension 09/29/2021   Blurry vision 09/29/2021   GERD (gastroesophageal reflux disease) 09/29/2021   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 09/25/2021    ONSET DATE: 09-25-21  REFERRING DIAG: I61.9 (ICD-10-CM) - Hemorrhagic stroke ([place])   THERAPY DIAG:  Hemiplegia and 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 left non-dominant side ([name])  Other abnormalities of gait and mobility  Muscle weakness  (generalized)  Unsteadiness on feet  Rationale for Evaluation and Treatment Rehabilitation  SUBJECTIVE:                                                                                                                                                                                              SUBJECTIVE STATEMENT: Pt presents for aquatic therapy - accompanied by her daughter; pt has obtained a new air cast  from [employer]   Pt accompanied by: [name], Charlotte Hunter  PERTINENT HISTORY: 68 year old female here for evaluation of right thalamic intracerebral hemorrhage. Patient is was admitted to the hospital in June 2023 for new onset left-sided weakness headache and nausea. She was found to have intracerebral hemorrhage in the right thalamus. Patient was on Eliquis anticoagulation for atrial fibrillation at the time and this was reversed medically. She was started on hypertonic saline, blood pressure control in ICU admission. Patient went to inpatient rehabilitation. Now patient back home. Still has left-sided weakness and left hand and arm and leg pain. Has been to cardiology who recommended to hold anticoagulation for now as patient is not in atrial fibrillation.   Past medical history of atrial fibrillation on Eliquis aortic insufficiency, hypertension, fibromyalgia  PAIN:  Are you having pain? Yes Location: Lt side of face & chin, edge of Lt arm (lateral side), foot and ankle and Lt buttock  Intensity: 1-2/10  Nerve pain, occasional shock sensation, "rope-like sensation";   04-09-22: not so much in pain but more numbness in Lt side of face - states numbness is moving out    PRECAUTIONS: Fall  WEIGHT BEARING RESTRICTIONS No  FALLS: Has patient fallen in last 6 months? No  LIVING ENVIRONMENT: Lives with: daughter, Charlotte Hunter Lives in: House/apartment Stairs: No - Ramp has been built at front entrance Has following equipment at home: Chiropractor, Environmental consultant - 2 wheeled, and bed side  commode  PLOF: Independent; pt did gardening prior to CVA - was not employed prior to onset of CVA  PATIENT GOALS "be restored back to where I was"   OBJECTIVE:    Aquatic therapy at [facility] - pool temp 90 degrees  Patient seen for aquatic therapy today.  Treatment took place in water 3.5-4.5 feet deep depending upon activity.  Wheelchair was positioned to facing chair lift - pt transferred using squat pivot transfer toward Rt side.  Air cast was donned on her LLE with water shoes also donned.   Pt performed water walking forward 18' across pool with use of PT's HHA - pt held onto PT's forearms for UE support - approx. 18' x 4 reps; sidestepping 18' x 2 reps with UE support on PT's forearms Slow gait speed due to increased time needed for Lt foot positioning  Pt performed stepping laterally - out and in 5 reps with RLE for increased weight shift onto LLE; stepping forward/backward with RLE 5 reps for weight shift and SLS on LLE  Pt performed tap ups onto 1st step in pool with RLE for LLE weight bearing and closed chain strengthening - Lt knee blocked to prevent hyperextension; pt performed 10 reps tap ups with RLE with min assist for balance with RUE support  Pt requires buoyancy of water for support for joint unloading and for reduced fall risk with gait training without device than can be safely performed on land. Water current produces perturbations for challenge for static & dynamic standing balance.  Buoyancy of water provides unweighting which assists with pain reduction.    TherEx:  Access Code: [email or web address] - issued on 01-17-22 URL: [email or web address] Date: 01/17/2022 Prepared by: Maebelle Munroe  Exercises - Seated Soleus Stretch  - 2-3 x daily - 7 x weekly - 1 sets - 2 reps - 15-20 secs  hold - Slant Board Gastrocnemius Stretch  - 3 x daily - 7 x weekly - 1 sets - 2 reps - 15-20 secs hold - Seated Gastroc Stretch with Strap  -  2-3 x daily - 7 x weekly - 1 sets - 2  reps - 15-20 hold   PATIENT EDUCATION:   Education details: continue to practice gait with RW at home despite ongoing anxiety and fear of falling Person educated: Patient Education method: Explanation, Demonstration Education comprehension: verbalized understanding, demonstration   HOME EXERCISE PROGRAM:  Previously issued Access Code: [email or web address] URL: [email or web address] Date: 01/08/2022 Prepared by: Ethelene Browns  Exercises - Bridge  - 1 x daily - 7 x weekly - 1 sets - 10 reps - 5 hold - Single Leg Bridge  - 1 x daily - 7 x weekly - 1 sets - 10 reps - 3 sec hold - Hooklying Clamshell with Resistance  - 1 x daily - 7 x weekly - 1 sets - 10 reps - 2-3 sec hold - Supine Hip Flexion with Resistance Loop  - 1 x daily - 7 x weekly - 3 sets - 10 reps - Supine Heel Slide  - 1 x daily - 7 x weekly - 3 sets - 10 reps - Stepping out/in with LEFT leg  - 1 x daily - 7 x weekly - 3 sets - 10 reps - Hooklying Hamstring Stretch with Strap  - 1 x daily - 7 x weekly - 1 sets - 1-2 reps - 20 sec hold  GOALS: Goals reviewed with patient? Yes  Goals assessed 04-01-22  Modified independent household amb. With RW.  Baseline: CGA with RW with household ambulation Goal status: Ongoing - not fully met  2.  Pt will increase Berg balance test score to >/= 41/56 to reduce fall risk.  Baseline:  33/56 on 04-01-22 Goal status: NEW  3.  Amb. 350' with RW with SBA for increased community accessibility.  Baseline:  Goal status: Ongoing - 25' with RW with CGA  4.  Negotiate 4 steps with use of handrail with SBA using step by step sequence. Baseline:  Goal status: ONGOING  5.  Pt will improve TUG score by at least 30 secs with RW to demo improved functional mobility and reduced fall risk.  Baseline:  Goal status: ONGOING  6.  Pt will negotiate ramp and curb with RW with SBA for increased community accessibility.  Baseline:  Goal status: ONGOING   NEW SHORT TERM GOALS:  TARGET DATE  05-03-22   Perform basic transfers with supervision from wheelchair to/from mat toward Rt or Lt side. Baseline:  CGA toward Lt side; SBA toward Rt side Goal status:  NEW  2.  Amb. 230' with RW with SBA on flat, even surface to demo improved endurance.  Baseline:  84' with RW with CGA on 04-01-22  Goal status:  Ongoing  3. Pt will amb. From lobby into/out of clinic with RW with CGA.  Baseline:  currently using wheelchair  Goal status:  NEW  4.  Improve Berg score to >/= 37/56 to demo improved balance.  Baseline:  16/56 on 01-18-22; 31/56 on 02-28-22;  33/56 on 04-01-22  Goal status: NEW  5.  Perform step negotiation with use of Rt hand rail with CGA (4 steps) using step by step sequence.  Baseline:  min assist  Goal status:  Ongoing    6.  Assess TUG with RW. Baseline:  2" with [facility] Goal status:  NEW  7.  Pt will report decreased pain on Lt side of body to </= 4/10 after aquatic therapy. Baseline:  8/10 Goal status:  NEW   NEW LONG TERM GOALS:  TARGET DATE:  05-31-22  Modified independent household amb. With RW.  Baseline: CGA with RW with household ambulation Goal status: Ongoing  2.  Pt will increase Berg balance test score to >/= 41/56 to reduce fall risk.  Baseline:  33/56 on 04-01-22 Goal status: NEW  3.  Amb. 350' with RW with SBA for increased community accessibility.  Baseline:  Goal status: Ongoing - 18' with RW with CGA  4.  Negotiate 4 steps with use of handrail with SBA using step by step sequence. Baseline:  Goal status: ONGOING  5.  Pt will improve TUG score by at least 30 secs with RW to demo improved functional mobility and reduced fall risk.  Baseline:  Goal status: ONGOING  6.  Pt will negotiate ramp and curb with RW with SBA for increased community accessibility.  Baseline:  Goal status: ONGOING  ASSESSMENT:  CLINICAL IMPRESSION:   Aquatic PT session focused on gait training in 4' water depth with use of PT's forearms for bil. UE support.   Pt wore air cast on LLE with less supination noted in today's session than in previous initial aquatic PT session.  Pt's Lt knee continues to hyperextend in water with need for moderate manual blocking to prevent hyperextension.  Gait speed remains significantly decreased in water due to pt's precise placement of Lt foot to minimize supination.  Continue POC.    OBJECTIVE IMPAIRMENTS Abnormal gait, decreased activity tolerance, decreased balance, decreased coordination, decreased endurance, decreased knowledge of use of DME, decreased ROM, decreased strength, impaired sensation, impaired tone, and impaired UE functional use.   ACTIVITY LIMITATIONS carrying, lifting, bending, standing, squatting, stairs, transfers, bed mobility, and locomotion level  PARTICIPATION LIMITATIONS: meal prep, cleaning, laundry, driving, shopping, community activity, and yard work  PERSONAL FACTORS Fitness and severity of deficits  are also affecting patient's functional outcome.   REHAB POTENTIAL: Good  CLINICAL DECISION MAKING: Evolving/moderate complexity  EVALUATION COMPLEXITY: Moderate  PLAN: PT FREQUENCY: 2x/week for 8 weeks  PT DURATION: 8 weeks  PLANNED INTERVENTIONS: Therapeutic exercises, Therapeutic activity, Neuromuscular re-education, Balance training, Gait training, Patient/Family education, Self Care, Stair training, Orthotic/Fit training, DME instructions, Aquatic Therapy, and Wheelchair mobility training  PLAN FOR NEXT SESSION:   Cont with gait training with RW; balance exercises/LLE SLS, strengthening exercises   Castulo Scarpelli, Donavan Burnet, PT  [facility] [address] [place], [place] [address] 05/07/2022, 2:32 PM

## 2022-05-07 NOTE — Telephone Encounter (Signed)
Patient called in requesting refills on gabapentin 400MG  , and cymbalta 20mg  , pt uses [facility] on [address] in [place]. Patient asked if she could be called once its called in

## 2022-05-08 ENCOUNTER — Ambulatory Visit: Payer: Medicare Other | Admitting: Physical Therapy

## 2022-05-08 ENCOUNTER — Ambulatory Visit: Payer: Medicare Other | Admitting: Occupational Therapy

## 2022-05-08 DIAGNOSIS — R2681 Unsteadiness on feet: Secondary | ICD-10-CM

## 2022-05-08 DIAGNOSIS — R29818 Other symptoms and signs involving the nervous system: Secondary | ICD-10-CM

## 2022-05-08 DIAGNOSIS — M6281 Muscle weakness (generalized): Secondary | ICD-10-CM

## 2022-05-08 DIAGNOSIS — R278 Other lack of coordination: Secondary | ICD-10-CM

## 2022-05-08 DIAGNOSIS — I69154 Hemiplegia and 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 left non-dominant side: Secondary | ICD-10-CM

## 2022-05-08 DIAGNOSIS — I69318 Other symptoms and signs involving cognitive functions following cerebral infarction: Secondary | ICD-10-CM

## 2022-05-08 DIAGNOSIS — R208 Other disturbances of skin sensation: Secondary | ICD-10-CM

## 2022-05-08 DIAGNOSIS — R2689 Other abnormalities of gait and mobility: Secondary | ICD-10-CM

## 2022-05-08 NOTE — Therapy (Signed)
OUTPATIENT OCCUPATIONAL THERAPY NEURO TREATMENT/RENEWAL  Patient Name: Charlotte Hunter MRN: [id number] DOB:Jun 12, 1955, 67 y.o., female Today's Date: 05/08/2022  PCP: Charlotte Grand, MD REFERRING PROVIDER: Bayard Hugger, NP    OT End of Session - 05/08/22 1228     Visit Number 17    Number of Visits 73    Date for OT Re-Evaluation 07/05/22    Authorization Type MCR, BC/BS    Authorization - Number of Visits 17    Progress Note Due on Visit 66    OT Start Time 1232    OT Stop Time 9562    OT Time Calculation (min) 41 min                Past Medical History:  Diagnosis Date   A-fib ([place])    Aortic insufficiency    Hypertension    Stroke [facility])    Past Surgical History:  Procedure Laterality Date   ABDOMINAL HYSTERECTOMY     ABLATION  05/03/2021   heart   c section  01/1968   KNEE SURGERY Right    meniscus   SHOULDER SURGERY Right 08/2014   Patient Active Problem List   Diagnosis Date Noted   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   ICH (intracerebral hemorrhage) ([place]) 10/01/2021   Paroxysmal atrial fibrillation ([place]) 09/29/2021   Stenosis of intracranial vessel 09/29/2021   Essential hypertension 09/29/2021   Blurry vision 09/29/2021   GERD (gastroesophageal reflux disease) 09/29/2021   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 09/25/2021    ONSET DATE: 12/14/2021 Referral date  REFERRING DIAG: I61.9 (ICD-10-CM) - Hemorrhagic stroke ([place])   THERAPY DIAG:  Other abnormalities of gait and mobility  Muscle weakness (generalized)  Unsteadiness on feet  Other lack of coordination  Other disturbances of skin sensation  Other symptoms and signs involving cognitive functions following cerebral infarction  Hemiplegia and 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 left non-dominant side ([name])  Other symptoms and signs involving the nervous system  Rationale for Evaluation and Treatment  Rehabilitation  SUBJECTIVE:   SUBJECTIVE STATEMENT: Pt report her pain is better today Pt accompanied by: self   PERTINENT HISTORY: 67 year old female here for evaluation of right thalamic intracerebral hemorrhage. Patient was admitted to the hospital in September 25, 2021 for new onset left-sided weakness headache and nausea. She was found to have intracerebral hemorrhage in the right thalamus. Patient was on Eliquis anticoagulation for atrial fibrillation at the time and this was reversed medically. She was started on hypertonic saline, blood pressure control in ICU admission. Patient went to inpatient rehabilitation. Then pt had HH therapies. Still has left-sided weakness and left hand and arm and leg pain. Has been to cardiology who recommended to hold anticoagulation for now as patient is not in atrial fibrillation.    Past medical history of atrial fibrillation on Eliquis aortic insufficiency, HTN, fibromyalgia  PRECAUTIONS: Fall and Other: NO lifting > 14 lbs, no driving  WEIGHT BEARING RESTRICTIONS: No  PAIN:  Are you having pain? Yes: NPRS scale: 6/10 Pain location: Lt shoulder, Lt torso, hand and wrist in spots  Pain description: burning, constant  Aggravating factors: cold weather Relieving factors: meds, voltaren gel  FALLS: Has patient fallen in last 6 months? No  LIVING ENVIRONMENT: Lives with: daughter, Charlotte Hunter Lives in: House/apartment Stairs: No - Ramp has been built at front entrance Has following equipment at home: w/c, quad cane large base, Walker - 2 wheeled, and bed side commode  PLOF: Independent; pt did gardening prior  to CVA - was not employed prior to onset of CVA   PATIENT GOALS: get my hand better, return to gardening  OBJECTIVE: from eval  HAND DOMINANCE: Right  ADLs: Eating: dependent for cutting food, mod I eating Grooming: mod I  UB Dressing: min assist w/ sports bra and getting shirt overhead LB Dressing: min assist w/ Lt shoe (d/t brace) and tying  shoes Toileting: uses BSC - min assist for clothes management Bathing: seated - w/ min to assist for back, bottom, and feet Tub Shower transfers: grab bar, ? Tub transfer bench for shower (w/ high thresh hold) - close sup to CGA  IADLs: Shopping: daughter performing now Light housekeeping: daughter performing now Meal Prep: pt can heat up things in microwave, make sandwich, however daughter cooking Community mobility: relies on daughter or friend for transportation Medication management: does w/ pillbox and strategies Financial management: mod I  Handwriting:  denies change (difficulty stabilizing check w/ Lt hand)   MOBILITY STATUS:  primarily uses w/c, sup using walker shorter distances   UPPER EXTREMITY ROM:  RUE AROM WNL's.  LUE: Pt has approx 60* sh flexion w/ no control and compensations into abd, IR and forearm pronation. Pt has approx 75% ER, 50% IR. Full elbow flex, 75% elbow ext and supination, wrist WFL's, 90% finger flexion    HAND FUNCTION: Grip strength: Right: 68.1 lbs; Left: 14.5 lbs  COORDINATION: Box & Blocks Lt = 7,  9 hole peg unable LUE Pt able to pick up pen Lt hand but unable to manipulate  SENSATION: Light touch: absent except slight detection more proximal hand - unable to localize. Absent in fingertips Proprioception: Impaired    MUSCLE TONE: LUE: dystonia   COGNITION: Overall cognitive status:  inconsistent w/ comprehension of questions during eval, short term memory deficits  VISION: Subjective report: diplopia resolved Baseline vision: Bifocals Visual history:  none  VISION ASSESSMENT: Not tested  Patient has difficulty with following activities due to following visual impairments: Pt reports some blurriness w/ reading     TODAY'S TREATMENT: Seated edge of mat, weightbearing through LUE hand then elbow for body on arm movements and lateral trunk flexion, min facilitation and v.c  for proper alignment Pt reports she wants to walk into  her shower and sit in a plastic chair. Therapist simulated this with pt. Having her step over a ledge as if getting into shower. PT with difficulty requiring min A from therapist for balance and difficulty clearing "ledge". Therapist recommends pt continues using the "tub bench" to get into shower as she can be more safe and more I and does not have to step over ledge. Closed chain shoulder flexion reaching for floor with boomwhacker then transitioning to chest press, min v.c to avoid shoulder hike. Reaching down to retrieve cones from floor to place on bedside table, with LUE, therapist initally stabilizing cones for low to mid level reach, min difficulty, suprervision for safety Weightbearing through bilateral UE's in seat of chair for small"push up action, min-mod facilitation/ v.c  PATIENT EDUCATION: See above      HOME EXERCISE PROGRAM: 02/12/22: safety considerations d/t lack of sensation Lt hand, functional tasks to perform w/ Lt hand, shoulder HEP, and A/E recommendations (bath mitt, hands free blow dryer holder)    GOALS: Goals reviewed with patient? Yes  SHORT TERM GOALS: Target date: 03/09/22  Independent with initial HEP  Baseline: Goal status: MET  2.  Pt to cut food w/ A/E prn and report greater ease opening  peel containers (yogurts, deli meat) Baseline:  Goal status: MET  3.  Pt to improve LUE functional use by demo improvement on Box & Blocks to 10 or more Baseline: 7 blocks Goal status: REVISED (03/11/22, 05/06/22: 8 blocks)   4.  Pt to perform UB dressing mod I level Baseline:  Goal status: MET  5.  Pt to perform low level functional reaching w/ min compensations to grasp/release 1" objects Baseline:  Goal status: MET  6.  Pt to consistently perform clothes management w/ min assist from LUE w/o LOB Baseline:  Goal status: MET  7.  Pt to verbalize understanding of safety strategies d/t lack of sensation Lt hand Baseline:  Goal status: MET  8.  Pt  will be able to hold cup in Lt hand consistently and wash under Rt arm better Baseline:  Goal status: INITIAL     LONG TERM GOALS: Target date: 05/07/22  Independent w/ updated HEP  Baseline:  Goal status: IN PROGRESS  2.  Pt to improve LUE function as evidenced by performing Box & Blocks to 13 or more Baseline: 7 blocks Goal status: REVISED  3.  Pt to improve [facility] Lt hand as evidenced by performing 9 hole peg test in under 2 min Baseline: unable Goal status: INITIAL  4.  Pt to be mod I for all ADLS (except sup for shower transfers)  Baseline:  Goal status: MET  5.  Pt to perform 70* sh flexion for functional light reaching with min compensations LUE Baseline:  Goal status: IN PROGRESS (Approx 60*)  6.  Grip strength Lt hand to be 20 lbs or greater Baseline: 14.5 lbs Goal status: IN PROGRESS (18.5 LBS)   7.  Pt will be mod I for gardening task w/ DME/AE prn Baseline:  Goal status: INITIAL   ASSESSMENT:  CLINICAL IMPRESSION: Pt is progressing towards goals with decreased pain and increased LUE functional use.  PERFORMANCE DEFICITS: in functional skills including ADLs, IADLs, coordination, dexterity, proprioception, sensation, tone, ROM, strength, pain, Fine motor control, Gross motor control, mobility, balance, endurance, decreased knowledge of use of DME, and UE functional use, cognitive skills including memory, problem solving, and safety awareness.   IMPAIRMENTS: are limiting patient from ADLs, IADLs, leisure, and social participation.   CO-MORBIDITIES; may have co-morbidities  that affects occupational performance. Patient will benefit from skilled OT to address above impairments and improve overall function.  MODIFICATION OR ASSISTANCE TO COMPLETE EVALUATION: Min-Moderate modification of tasks or assist with assess necessary to complete an evaluation.  OT OCCUPATIONAL PROFILE AND HISTORY: Problem focused assessment: Including review of records relating to  presenting problem.  CLINICAL DECISION MAKING: Moderate - several treatment options, min-mod task modification necessary  REHAB POTENTIAL: Good  EVALUATION COMPLEXITY: Low    PLAN:  OT FREQUENCY: 2x/week  OT DURATION: up to 8 additional weeks  PLANNED INTERVENTIONS: self care/ADL training, therapeutic exercise, therapeutic activity, neuromuscular re-education, passive range of motion, functional mobility training, aquatic therapy, splinting, fluidotherapy, moist heat, patient/family education, cognitive remediation/compensation, visual/perceptual remediation/compensation, coping strategies training, and DME and/or AE instructions  RECOMMENDED OTHER SERVICES: will monitor for speech eval needs if cognition deficits impeding ADLS  CONSULTED AND AGREED WITH PLAN OF CARE: Patient and family member/caregiver  PLAN FOR NEXT SESSION:  continue neuro re-education LUE/trunk, consider weightbearing activities, modified quadraped, prone ,functional activities LUE    Keene Breath, OTR/L [phone] Phone: [phone] 4:35 PM 05/08/22  05/08/22 4:35 PM Phone [phone] FAX [phone]

## 2022-05-08 NOTE — Therapy (Signed)
OUTPATIENT PHYSICAL THERAPY NEURO TREATMENT NOTE     Patient Name: Charlotte Hunter MRN: [id number] DOB:[birth year], 67 y.o., female Today's Date: 05/08/2022   PCP: Charlotte Amel, MD REFERRING PROVIDER: Jones Bales, NP     PT End of Session - 05/08/22 1325     Visit Number 34    Number of Visits 52   renewal completed for 16 additional visits   Date for PT Re-Evaluation 05/31/22    Authorization Type Medicare    Authorization Time Period 01-03-22 - 04-03-22;  04-01-22 - 06-13-22    Progress Note Due on Visit 30    PT Start Time 1315    PT Stop Time 1410    PT Time Calculation (min) 55 min    Equipment Utilized During Treatment Gait belt    Activity Tolerance Patient tolerated treatment well    Behavior During Therapy WFL for tasks assessed/performed                                     Past Medical History:  Diagnosis Date   A-fib [facility])    Aortic insufficiency    Hypertension    Stroke [facility])    Past Surgical History:  Procedure Laterality Date   ABDOMINAL HYSTERECTOMY     ABLATION  05/03/2021   heart   c section  01/1968   KNEE SURGERY Right    meniscus   SHOULDER SURGERY Right 08/2014   Patient Active Problem List   Diagnosis Date Noted   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   ICH (intracerebral hemorrhage) ([name]) 10/01/2021   Paroxysmal atrial fibrillation ([name]) 09/29/2021   Stenosis of intracranial vessel 09/29/2021   Essential hypertension 09/29/2021   Blurry vision 09/29/2021   GERD (gastroesophageal reflux disease) 09/29/2021   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 09/25/2021    ONSET DATE: 09-25-21  REFERRING DIAG: I61.9 (ICD-10-CM) - Hemorrhagic stroke ([name])   THERAPY DIAG:  Hemiplegia and 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 left non-dominant side ([name])  Other abnormalities of gait and mobility  Muscle weakness (generalized)  Unsteadiness on feet  Rationale for  Evaluation and Treatment Rehabilitation  SUBJECTIVE:                                                                                                                                                                                              SUBJECTIVE STATEMENT:  Pt handed off from OT session, pt needing to use bathroom at beginning of session. Pt has not tried walking at home with her [facility] as  she does not have many opportunities with Supervision since her daughter went back to work.  Pt accompanied by: [name], Charlotte Hunter  PERTINENT HISTORY: 67 year old female here for evaluation of right thalamic intracerebral hemorrhage. Patient is was admitted to the hospital in June 2023 for new onset left-sided weakness headache and nausea. She was found to have intracerebral hemorrhage in the right thalamus. Patient was on Eliquis anticoagulation for atrial fibrillation at the time and this was reversed medically. She was started on hypertonic saline, blood pressure control in ICU admission. Patient went to inpatient rehabilitation. Now patient back home. Still has left-sided weakness and left hand and arm and leg pain. Has been to cardiology who recommended to hold anticoagulation for now as patient is not in atrial fibrillation.   Past medical history of atrial fibrillation on Eliquis aortic insufficiency, hypertension, fibromyalgia  PAIN:  Are you having pain? Yes Location: Lt side of face & chin, edge of Lt arm (lateral side), foot and ankle and Lt buttock  Intensity: 1-2/10  Nerve pain, occasional shock sensation, "rope-like sensation";   04-09-22: not so much in pain but more numbness in Lt side of face - states numbness is moving out    PRECAUTIONS: Fall  WEIGHT BEARING RESTRICTIONS No  FALLS: Has patient fallen in last 6 months? No  LIVING ENVIRONMENT: Lives with: daughter, Charlotte Hunter Lives in: House/apartment Stairs: No - Ramp has been built at front entrance Has following equipment at home: Engineer, site, Environmental consultant - 2 wheeled, and bed side commode  PLOF: Independent; pt did gardening prior to CVA - was not employed prior to onset of CVA  PATIENT GOALS "be restored back to where I was"   OBJECTIVE:   THER ACT:  [facility] Assessment - 05/08/22 1329       Berg Balance Test   Sit to Stand Able to stand  independently using hands    Standing Unsupported Able to stand 2 minutes with supervision    Sitting with Back Unsupported but Feet Supported on Floor or Stool Able to sit safely and securely 2 minutes    Stand to Sit Controls descent by using hands    Transfers Able to transfer safely, definite need of hands    Standing Unsupported with Eyes Closed Able to stand 10 seconds with supervision    Standing Unsupported with Feet Together Needs help to attain position but able to stand for 30 seconds with feet together    From Standing, Reach Forward with Outstretched Arm Can reach confidently >25 cm (10")    From Standing Position, Pick up Object from Floor Able to pick up shoe, needs supervision    From Standing Position, Turn to Look Behind Over each Shoulder Looks behind one side only/other side shows less weight shift    Turn 360 Degrees Needs assistance while turning    Standing Unsupported, Alternately Place Feet on Step/Stool Able to complete >2 steps/needs minimal assist    Standing Unsupported, One Foot in Front Able to take small step independently and hold 30 seconds    Standing on One Leg Tries to lift leg/unable to hold 3 seconds but remains standing independently    Total Score 34             GAIT: Gait pattern: decreased step length- Right, decreased stance time- Left, decreased hip/knee flexion- Left, and genu recurvatum- Left Distance walked: 230 ft (with one seated rest break) Assistive device utilized: Walker - 2 wheeled Level of assistance: Modified independence  Comments: pt limited in gait distance by R hip and knee pain   TherEx:  Access Code:  Charlotte Hunter - issued on 01-17-22 URL: [email or web address] Date: 01/17/2022 Prepared by: Charlotte Hunter  Exercises - Seated Soleus Stretch  - 2-3 x daily - 7 x weekly - 1 sets - 2 reps - 15-20 secs  hold - Slant Board Gastrocnemius Stretch  - 3 x daily - 7 x weekly - 1 sets - 2 reps - 15-20 secs hold - Seated Gastroc Stretch with Strap  - 2-3 x daily - 7 x weekly - 1 sets - 2 reps - 15-20 hold   PATIENT EDUCATION:   Education details: continue to practice gait with RW at home despite ongoing anxiety and fear of falling Person educated: Patient Education method: Explanation, Demonstration Education comprehension: verbalized understanding, demonstration   HOME EXERCISE PROGRAM:  Previously issued Access Code: [email or web address] URL: [email or web address] Date: 01/08/2022 Prepared by: Charlotte Hunter  Exercises - Bridge  - 1 x daily - 7 x weekly - 1 sets - 10 reps - 5 hold - Single Leg Bridge  - 1 x daily - 7 x weekly - 1 sets - 10 reps - 3 sec hold - Hooklying Clamshell with Resistance  - 1 x daily - 7 x weekly - 1 sets - 10 reps - 2-3 sec hold - Supine Hip Flexion with Resistance Loop  - 1 x daily - 7 x weekly - 3 sets - 10 reps - Supine Heel Slide  - 1 x daily - 7 x weekly - 3 sets - 10 reps - Stepping out/in with LEFT leg  - 1 x daily - 7 x weekly - 3 sets - 10 reps - Hooklying Hamstring Stretch with Strap  - 1 x daily - 7 x weekly - 1 sets - 1-2 reps - 20 sec hold  GOALS: Goals reviewed with patient? Yes  Goals assessed 04-01-22  Modified independent household amb. With RW.  Baseline: CGA with RW with household ambulation Goal status: Ongoing - not fully met  2.  Pt will increase Berg balance test score to >/= 41/56 to reduce fall risk.  Baseline:  33/56 on 04-01-22 Goal status: NEW  3.  Amb. 350' with RW with SBA for increased community accessibility.  Baseline:  Goal status: Ongoing - 28' with RW with CGA  4.  Negotiate 4 steps with use of handrail with  SBA using step by step sequence. Baseline:  Goal status: ONGOING  5.  Pt will improve TUG score by at least 30 secs with RW to demo improved functional mobility and reduced fall risk.  Baseline:  Goal status: ONGOING  6.  Pt will negotiate ramp and curb with RW with SBA for increased community accessibility.  Baseline:  Goal status: ONGOING   NEW SHORT TERM GOALS:  TARGET DATE 05-03-22   Perform basic transfers with supervision from wheelchair to/from mat toward Rt or Lt side. Baseline:  CGA toward Lt side; SBA toward Rt side; SBA both direction (1/24) Goal status:  GOAL MET  2.  Amb. 230' with RW with SBA on flat, even surface to demo improved endurance.  Baseline:  50' with RW with CGA on 04-01-22, 230 ft with RW and mod I but one seated rest break (1/24)  Goal status:  IN PROGRESS  3. Pt will amb. From lobby into/out of clinic with RW with CGA.  Baseline:  currently using wheelchair, has ambulated back to car from therapy gym 2 times but needs encouragement  to continue (1/24)  Goal status: IN PROGRESS  4.  Improve Berg score to >/= 37/56 to demo improved balance.  Baseline:  16/56 on 01-18-22; 31/56 on 02-28-22;  33/56 on 04-01-22, 34/56 (1/24)  Goal status: IN PROGRESS  5.  Perform step negotiation with use of Rt hand rail with CGA (4 steps) using step by step sequence.  Baseline:  min assist  Goal status:  Ongoing    6.  Assess TUG with RW. Baseline:  2" with [facility] Goal status:  NEW  7.  Pt will report decreased pain on Lt side of body to </= 4/10 after aquatic therapy. Baseline:  8/10 Goal status:  NEW   NEW LONG TERM GOALS:  TARGET DATE:  05-31-22  Modified independent household amb. With RW.  Baseline: CGA with RW with household ambulation Goal status: Ongoing  2.  Pt will increase Berg balance test score to >/= 41/56 to reduce fall risk.  Baseline:  33/56 on 04-01-22 Goal status: NEW  3.  Amb. 350' with RW with SBA for increased community accessibility.   Baseline:  Goal status: Ongoing - 18' with RW with CGA  4.  Negotiate 4 steps with use of handrail with SBA using step by step sequence. Baseline:  Goal status: ONGOING  5.  Pt will improve TUG score by at least 30 secs with RW to demo improved functional mobility and reduced fall risk.  Baseline:  Goal status: ONGOING  6.  Pt will negotiate ramp and curb with RW with SBA for increased community accessibility.  Baseline:  Goal status: ONGOING  ASSESSMENT:  CLINICAL IMPRESSION: Emphasis of skilled PT session on initiating STG assessment. Pt has met 1/4 STG assessed but is making progress towards all goals. Will reassess remainder of goals next session. Pt is able to transfer both directions at Supervision/SBA level, improved her Berg score from 16/56 initially to 34/56 this date but did not quite meet her goal of 37/56, has ambulated back to her car at the end of her therapy session after 2 visits but has not shown consistency in being able to ambulate in/out of her therapy appointments, and has been able to ambulate x 230 ft total with RW but did require one seated rest break. Overall pt is making slow but steady progress towards therapy goals. Pt does remain limited by needing increased encouragement to push herself during therapy sessions as well as remains limited by ongoing thalamic pain 2/2 her stroke as well as chronic RLE pain. Continue POC.     OBJECTIVE IMPAIRMENTS Abnormal gait, decreased activity tolerance, decreased balance, decreased coordination, decreased endurance, decreased knowledge of use of DME, decreased ROM, decreased strength, impaired sensation, impaired tone, and impaired UE functional use.   ACTIVITY LIMITATIONS carrying, lifting, bending, standing, squatting, stairs, transfers, bed mobility, and locomotion level  PARTICIPATION LIMITATIONS: meal prep, cleaning, laundry, driving, shopping, community activity, and yard work  [place] and  severity of deficits  are also affecting patient's functional outcome.   REHAB POTENTIAL: Good  CLINICAL DECISION MAKING: Evolving/moderate complexity  EVALUATION COMPLEXITY: Moderate  PLAN: PT FREQUENCY: 2x/week for 8 weeks  PT DURATION: 8 weeks  PLANNED INTERVENTIONS: Therapeutic exercises, Therapeutic activity, Neuromuscular re-education, Balance training, Gait training, Patient/Family education, Self Care, Stair training, Orthotic/Fit training, DME instructions, Aquatic Therapy, and Wheelchair mobility training  PLAN FOR NEXT SESSION:   Cont with gait training with RW; balance exercises/LLE SLS, strengthening exercises; assess TUG and other STG next land session   [employer]  Serita Grit PT, DPT, CSRS  [facility] [address] [place], [place] [address] 05/08/2022, 2:10 PM

## 2022-05-13 ENCOUNTER — Encounter: Payer: Self-pay | Admitting: Occupational Therapy

## 2022-05-13 ENCOUNTER — Ambulatory Visit: Payer: Medicare Other | Admitting: Occupational Therapy

## 2022-05-13 ENCOUNTER — Ambulatory Visit: Payer: Medicare Other | Admitting: Physical Therapy

## 2022-05-13 DIAGNOSIS — R2681 Unsteadiness on feet: Secondary | ICD-10-CM

## 2022-05-13 DIAGNOSIS — I69154 Hemiplegia and 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 left non-dominant side: Secondary | ICD-10-CM

## 2022-05-13 DIAGNOSIS — M6281 Muscle weakness (generalized): Secondary | ICD-10-CM

## 2022-05-13 DIAGNOSIS — R208 Other disturbances of skin sensation: Secondary | ICD-10-CM

## 2022-05-13 DIAGNOSIS — R2689 Other abnormalities of gait and mobility: Secondary | ICD-10-CM

## 2022-05-13 DIAGNOSIS — R278 Other lack of coordination: Secondary | ICD-10-CM

## 2022-05-13 NOTE — Therapy (Signed)
OUTPATIENT OCCUPATIONAL THERAPY NEURO TREATMENT/RENEWAL  Patient Name: Charlotte Hunter MRN: [id number] DOB:Nov 12, 1955, 67 y.o., female Today's Date: 05/13/2022  PCP: Su Grand, MD REFERRING PROVIDER: Bayard Hugger, NP    OT End of Session - 05/13/22 1232     Visit Number 18    Number of Visits 76    Date for OT Re-Evaluation 07/05/22    Authorization Type MCR, BC/BS    Authorization - Visit Number 27    Authorization - Number of Visits 17    Progress Note Due on Visit 79    OT Start Time 19    OT Stop Time 1315    OT Time Calculation (min) 45 min    Activity Tolerance Patient tolerated treatment well    Behavior During Therapy WFL for tasks assessed/performed                Past Medical History:  Diagnosis Date   A-fib (Charlotte Hunter)    Aortic insufficiency    Hypertension    Stroke Charlotte Hunter)    Past Surgical History:  Procedure Laterality Date   ABDOMINAL HYSTERECTOMY     ABLATION  05/03/2021   heart   c section  01/1968   KNEE SURGERY Right    meniscus   SHOULDER SURGERY Right 08/2014   Patient Active Problem List   Diagnosis Date Noted   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   ICH (intracerebral hemorrhage) (Charlotte Hunter) 10/01/2021   Paroxysmal atrial fibrillation (Charlotte Hunter) 09/29/2021   Stenosis of intracranial vessel 09/29/2021   Essential hypertension 09/29/2021   Blurry vision 09/29/2021   GERD (gastroesophageal reflux disease) 09/29/2021   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  Pt accompanied by: self   PERTINENT HISTORY: 67 year old female here for evaluation of right thalamic intracerebral hemorrhage. Patient was admitted to the hospital in September 25, 2021 for new onset left-sided weakness headache and nausea. She was found to have intracerebral hemorrhage in the right thalamus. Patient was on Eliquis anticoagulation for atrial fibrillation at the time and this was reversed medically. She was started on hypertonic saline, blood pressure control in ICU admission. Patient went to inpatient rehabilitation. Then pt had HH therapies. Still has left-sided weakness and left hand and arm and leg pain. Has been to cardiology who recommended to hold anticoagulation for now as patient is not in atrial fibrillation.    Past medical history of atrial fibrillation on Eliquis aortic insufficiency, HTN, fibromyalgia  PRECAUTIONS: Fall and Other: NO lifting > 14 lbs, no driving  WEIGHT BEARING RESTRICTIONS: No  PAIN:  Are you having pain? Yes: NPRS scale: 4-5/10 Pain location: Lt shoulder, Lt torso, hand and wrist in spots  Pain description: burning, constant  Aggravating factors: cold weather Relieving factors: meds, voltaren gel  FALLS: Has patient fallen in last 6 months? No  LIVING ENVIRONMENT: Lives with: daughter, Charlotte Hunter Lives in: House/apartment Stairs: No - Ramp has been built at front entrance Has following equipment at home: w/c, quad cane large base, Walker - 2 wheeled, and bed side commode  PLOF: Independent; pt did gardening prior to  CVA - was not employed prior to onset of CVA   PATIENT GOALS: get my hand better, return to gardening  OBJECTIVE: from eval  HAND DOMINANCE: Right  ADLs: Eating: dependent for cutting food, mod I eating Grooming: mod I  UB Dressing: min assist w/ sports bra and getting shirt overhead LB Dressing: min assist w/ Lt shoe (d/t brace) and tying shoes Toileting: uses BSC - min assist for clothes  management Bathing: seated - w/ min to assist for back, bottom, and feet Tub Shower transfers: grab bar, ? Tub transfer bench for shower (w/ high thresh hold) - close sup to CGA  IADLs: Shopping: daughter performing now Light housekeeping: daughter performing now Meal Prep: pt can heat up things in microwave, make sandwich, however daughter cooking Community mobility: relies on daughter or friend for transportation Medication management: does w/ pillbox and strategies Financial management: mod I  Handwriting:  denies change (difficulty stabilizing check w/ Lt hand)   MOBILITY STATUS:  primarily uses w/c, sup using walker shorter distances   UPPER EXTREMITY ROM:  RUE AROM WNL's.  LUE: Pt has approx 60* sh flexion w/ no control and compensations into abd, IR and forearm pronation. Pt has approx 75% ER, 50% IR. Full elbow flex, 75% elbow ext and supination, wrist WFL's, 90% finger flexion    HAND FUNCTION: Grip strength: Right: 68.1 lbs; Left: 14.5 lbs  COORDINATION: Box & Blocks Lt = 7,  9 hole peg unable LUE Pt able to pick up pen Lt hand but unable to manipulate  SENSATION: Light touch: absent except slight detection more proximal hand - unable to localize. Absent in fingertips Proprioception: Impaired    MUSCLE TONE: LUE: dystonia   COGNITION: Overall cognitive status:  inconsistent w/ comprehension of questions during eval, short term memory deficits  VISION: Subjective report: diplopia resolved Baseline vision: Bifocals Visual history:  none  VISION ASSESSMENT: Not tested  Patient has difficulty with following activities due to following visual impairments: Pt reports some blurriness w/ reading     TODAY'S TREATMENT: Seated edge of mat, weightbearing through LUE hand then elbow for body on arm movements and lateral trunk flexion, min facilitation and v.c  for proper alignment  Pt placing checkers into Connect 4 slots on lower surface LUE w/ hand over hand  assist from Rt hand, w/ mod to max difficulty and drops.   UBE x 7 min, level 1 for normal reciprocal movement pattern and UB conditioning with 1 rest break and hand slipped x 2  PATIENT EDUCATION: See above      HOME EXERCISE PROGRAM: 02/12/22: safety considerations d/t lack of sensation Lt hand, functional tasks to perform w/ Lt hand, shoulder HEP, and A/E recommendations (bath mitt, hands free blow dryer holder)    GOALS: Goals reviewed with patient? Yes  SHORT TERM GOALS: Target date: 03/09/22  Independent with initial HEP  Baseline: Goal status: MET  2.  Pt to cut food w/ A/E prn and report greater ease opening peel containers (yogurts, deli meat) Baseline:  Goal status: MET  3.  Pt to improve LUE functional use by demo improvement on Box & Blocks to 10 or more Baseline: 7 blocks Goal status: REVISED (03/11/22, 05/06/22: 8 blocks)   4.  Pt to perform UB dressing mod I level Baseline:  Goal status: MET  5.  Pt to perform low level functional reaching w/ min compensations to grasp/release 1" objects Baseline:  Goal status: MET  6.  Pt to consistently perform clothes  management w/ min assist from LUE w/o LOB Baseline:  Goal status: MET  7.  Pt to verbalize understanding of safety strategies d/t lack of sensation Lt hand Baseline:  Goal status: MET  8.  Pt will be able to hold cup in Lt hand consistently and wash under Rt arm better Baseline:  Goal status: INITIAL     LONG TERM GOALS: Target date: 05/07/22  Independent w/ updated HEP  Baseline:  Goal status: IN PROGRESS  2.  Pt to improve LUE function as evidenced by performing Box & Blocks to 13 or more Baseline: 7 blocks Goal status: REVISED  3.  Pt to improve [facility] Lt hand as evidenced by performing 9 hole peg test in under 2 min Baseline: unable Goal status: INITIAL  4.  Pt to be mod I for all ADLS (except sup for shower transfers)  Baseline:  Goal status: MET  5.  Pt to perform 70* sh  flexion for functional light reaching with min compensations LUE Baseline:  Goal status: IN PROGRESS (Approx 60*)  6.  Grip strength Lt hand to be 20 lbs or greater Baseline: 14.5 lbs Goal status: IN PROGRESS (18.5 LBS)   7.  Pt will be mod I for gardening task w/ DME/AE prn Baseline:  Goal status: INITIAL   ASSESSMENT:  CLINICAL IMPRESSION: Pt is progressing towards goals with decreased pain and increased LUE functional use.  PERFORMANCE DEFICITS: in functional skills including ADLs, IADLs, coordination, dexterity, proprioception, sensation, tone, ROM, strength, pain, Fine motor control, Gross motor control, mobility, balance, endurance, decreased knowledge of use of DME, and UE functional use, cognitive skills including memory, problem solving, and safety awareness.   IMPAIRMENTS: are limiting patient from ADLs, IADLs, leisure, and social participation.   CO-MORBIDITIES; may have co-morbidities  that affects occupational performance. Patient will benefit from skilled OT to address above impairments and improve overall function.  MODIFICATION OR ASSISTANCE TO COMPLETE EVALUATION: Min-Moderate modification of tasks or assist with assess necessary to complete an evaluation.  OT OCCUPATIONAL PROFILE AND HISTORY: Problem focused assessment: Including review of records relating to presenting problem.  CLINICAL DECISION MAKING: Moderate - several treatment options, min-mod task modification necessary  REHAB POTENTIAL: Good  EVALUATION COMPLEXITY: Low    PLAN:  OT FREQUENCY: 2x/week  OT DURATION: up to 8 additional weeks  PLANNED INTERVENTIONS: self care/ADL training, therapeutic exercise, therapeutic activity, neuromuscular re-education, passive range of motion, functional mobility training, aquatic therapy, splinting, fluidotherapy, moist heat, patient/family education, cognitive remediation/compensation, visual/perceptual remediation/compensation, coping strategies training,  and DME and/or AE instructions  RECOMMENDED OTHER SERVICES: will monitor for speech eval needs if cognition deficits impeding ADLS  CONSULTED AND AGREED WITH PLAN OF CARE: Patient and family member/caregiver  PLAN FOR NEXT SESSION:  continue neuro re-education LUE/trunk, consider weightbearing activities, modified quadraped, prone ,functional activities LUE    Jene Every, OTR/L 05/13/22 12:33 PM Phone [phone] FAX [phone]

## 2022-05-14 ENCOUNTER — Encounter: Payer: Self-pay | Admitting: Physical Therapy

## 2022-05-14 NOTE — Therapy (Signed)
OUTPATIENT PHYSICAL THERAPY NEURO TREATMENT NOTE     Patient Name: Charlotte Hunter MRN: [id number] DOB:11/01/1955, 67 y.o., female Today's Date: 05/14/2022   PCP: Audery Amel, MD REFERRING PROVIDER: Jones Bales, NP     PT End of Session - 05/14/22 0805     Visit Number 35    Number of Visits 52   renewal completed for 16 additional visits   Date for PT Re-Evaluation 05/31/22    Authorization Type Medicare    Authorization Time Period 01-03-22 - 04-03-22;  04-01-22 - 06-13-22    Progress Note Due on Visit 30    PT Start Time 1400    PT Stop Time 1445    PT Time Calculation (min) 45 min    Equipment Utilized During Treatment Other (comment)   pt wore her air cast on LLE; aquatic step used   Activity Tolerance Patient tolerated treatment well    Behavior During Therapy WFL for tasks assessed/performed                                    Past Medical History:  Diagnosis Date   A-fib [facility])    Aortic insufficiency    Hypertension    Stroke [facility])    Past Surgical History:  Procedure Laterality Date   ABDOMINAL HYSTERECTOMY     ABLATION  05/03/2021   heart   c section  01/1968   KNEE SURGERY Right    meniscus   SHOULDER SURGERY Right 08/2014   Patient Active Problem List   Diagnosis Date Noted   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   ICH (intracerebral hemorrhage) ([name]) 10/01/2021   Paroxysmal atrial fibrillation ([name]) 09/29/2021   Stenosis of intracranial vessel 09/29/2021   Essential hypertension 09/29/2021   Blurry vision 09/29/2021   GERD (gastroesophageal reflux disease) 09/29/2021   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 09/25/2021    ONSET DATE: 09-25-21  REFERRING DIAG: I61.9 (ICD-10-CM) - Hemorrhagic stroke ([name])   THERAPY DIAG:  Hemiplegia and 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 left non-dominant side ([name])  Unsteadiness on feet  Other abnormalities of gait and  mobility  Muscle weakness (generalized)  Rationale for Evaluation and Treatment Rehabilitation  SUBJECTIVE:                                                                                                                                                                                              SUBJECTIVE STATEMENT: Pt presents for aquatic therapy - Pt's daughter, Charlotte Hunter, is present for aquatic session - wants to  participate to learn how to assist pt with aquatic exercise  Pt accompanied by: [name], Charlotte Hunter  PERTINENT HISTORY: 67 year old female here for evaluation of right thalamic intracerebral hemorrhage. Patient is was admitted to the hospital in June 2023 for new onset left-sided weakness headache and nausea. She was found to have intracerebral hemorrhage in the right thalamus. Patient was on Eliquis anticoagulation for atrial fibrillation at the time and this was reversed medically. She was started on hypertonic saline, blood pressure control in ICU admission. Patient went to inpatient rehabilitation. Now patient back home. Still has left-sided weakness and left hand and arm and leg pain. Has been to cardiology who recommended to hold anticoagulation for now as patient is not in atrial fibrillation.   Past medical history of atrial fibrillation on Eliquis aortic insufficiency, hypertension, fibromyalgia  PAIN:  Are you having pain? Yes Location: Lt side of face & chin, edge of Lt arm (lateral side), foot and ankle and Lt buttock  Intensity: 1-2/10  Nerve pain, occasional shock sensation, "rope-like sensation";   04-09-22: not so much in pain but more numbness in Lt side of face - states numbness is moving out    PRECAUTIONS: Fall  WEIGHT BEARING RESTRICTIONS No  FALLS: Has patient fallen in last 6 months? No  LIVING ENVIRONMENT: Lives with: daughter, Charlotte Hunter Lives in: House/apartment Stairs: No - Ramp has been built at front entrance Has following equipment at home: Patent examiner, Environmental consultant - 2 wheeled, and bed side commode  PLOF: Independent; pt did gardening prior to CVA - was not employed prior to onset of CVA  PATIENT GOALS "be restored back to where I was"   OBJECTIVE:    Aquatic therapy at [place] - pool temp 90 degrees  Patient seen for aquatic therapy today.  Treatment took place in water 3.5-4.5 feet deep depending upon activity.  Wheelchair was positioned to facing chair lift - pt transferred using squat pivot transfer toward Rt side with SBA.  Air cast was donned on her LLE with water shoes.  Pt's daughter, Charlotte Hunter, present and requests to participate in aquatic therapy session.   Pt performed water walking forward 18' across pool with use of PT's HHA - pt held onto PT's forearms for UE support - approx. 18' x 4 reps; sidestepping 18' x 2 reps with UE support on PT's forearms; backwards amb. 18' x 2 reps with pt holding onto PT's forearms for support Slow gait speed due to increased time needed for Lt foot positioning  Pt performed stepping laterally - out and in 5 reps with RLE for increased weight shift onto LLE; stepping forward/backward with RLE 5 reps for weight shift and SLS on LLE  Pt performed tap ups onto 1st step in pool with RLE for LLE weight bearing and closed chain strengthening - Lt knee blocked to prevent hyperextension; pt performed 10 reps tap ups with RLE with min assist for balance with RUE support  Pt performed step ups onto aquatic step with LLE for quad and hip strengthening 10 reps with RUE support on pool edge with CGA to min assist Step down exercise from aquatic step for Lt eccentric quad strengthening 10 reps with min assist  Pt requires buoyancy of water for support for joint unloading and for reduced fall risk with gait training without device than can be safely performed on land. Water current produces perturbations for challenge for static & dynamic standing balance.  Buoyancy of water provides unweighting which  assists with pain  reduction.    TherEx:  Access Code: [email or web address] - issued on 01-17-22 URL: [email or web address] Date: 01/17/2022 Prepared by: Ethelene Browns  Exercises - Seated Soleus Stretch  - 2-3 x daily - 7 x weekly - 1 sets - 2 reps - 15-20 secs  hold - Slant Board Gastrocnemius Stretch  - 3 x daily - 7 x weekly - 1 sets - 2 reps - 15-20 secs hold - Seated Gastroc Stretch with Strap  - 2-3 x daily - 7 x weekly - 1 sets - 2 reps - 15-20 hold   PATIENT EDUCATION:   Education details: continue to practice gait with RW at home despite ongoing anxiety and fear of falling Person educated: Patient Education method: Explanation, Demonstration Education comprehension: verbalized understanding, demonstration   HOME EXERCISE PROGRAM:  Previously issued Access Code: [email or web address] URL: [email or web address] Date: 01/08/2022 Prepared by: Ethelene Browns  Exercises - Bridge  - 1 x daily - 7 x weekly - 1 sets - 10 reps - 5 hold - Single Leg Bridge  - 1 x daily - 7 x weekly - 1 sets - 10 reps - 3 sec hold - Hooklying Clamshell with Resistance  - 1 x daily - 7 x weekly - 1 sets - 10 reps - 2-3 sec hold - Supine Hip Flexion with Resistance Loop  - 1 x daily - 7 x weekly - 3 sets - 10 reps - Supine Heel Slide  - 1 x daily - 7 x weekly - 3 sets - 10 reps - Stepping out/in with LEFT leg  - 1 x daily - 7 x weekly - 3 sets - 10 reps - Hooklying Hamstring Stretch with Strap  - 1 x daily - 7 x weekly - 1 sets - 1-2 reps - 20 sec hold  GOALS: Goals reviewed with patient? Yes  Goals assessed 04-01-22  Modified independent household amb. With RW.  Baseline: CGA with RW with household ambulation Goal status: Ongoing - not fully met  2.  Pt will increase Berg balance test score to >/= 41/56 to reduce fall risk.  Baseline:  33/56 on 04-01-22 Goal status: NEW  3.  Amb. 350' with RW with SBA for increased community accessibility.  Baseline:  Goal status: Ongoing - 6' with RW  with CGA  4.  Negotiate 4 steps with use of handrail with SBA using step by step sequence. Baseline:  Goal status: ONGOING  5.  Pt will improve TUG score by at least 30 secs with RW to demo improved functional mobility and reduced fall risk.  Baseline:  Goal status: ONGOING  6.  Pt will negotiate ramp and curb with RW with SBA for increased community accessibility.  Baseline:  Goal status: ONGOING   NEW SHORT TERM GOALS:  TARGET DATE 05-03-22   Perform basic transfers with supervision from wheelchair to/from mat toward Rt or Lt side. Baseline:  CGA toward Lt side; SBA toward Rt side Goal status:  NEW  2.  Amb. 230' with RW with SBA on flat, even surface to demo improved endurance.  Baseline:  33' with RW with CGA on 04-01-22  Goal status:  Ongoing  3. Pt will amb. From lobby into/out of clinic with RW with CGA.  Baseline:  currently using wheelchair  Goal status:  NEW  4.  Improve Berg score to >/= 37/56 to demo improved balance.  Baseline:  16/56 on 01-18-22; 31/56 on 02-28-22;  33/56 on 04-01-22  Goal status: NEW  5.  Perform step negotiation with use  of Rt hand rail with CGA (4 steps) using step by step sequence.  Baseline:  min assist  Goal status:  Ongoing    6.  Assess TUG with RW. Baseline:  2" with [facility] Goal status:  NEW  7.  Pt will report decreased pain on Lt side of body to </= 4/10 after aquatic therapy. Baseline:  8/10 Goal status:  NEW   NEW LONG TERM GOALS:  TARGET DATE:  05-31-22  Modified independent household amb. With RW.  Baseline: CGA with RW with household ambulation Goal status: Ongoing  2.  Pt will increase Berg balance test score to >/= 41/56 to reduce fall risk.  Baseline:  33/56 on 04-01-22 Goal status: NEW  3.  Amb. 350' with RW with SBA for increased community accessibility.  Baseline:  Goal status: Ongoing - 74' with RW with CGA  4.  Negotiate 4 steps with use of handrail with SBA using step by step sequence. Baseline:  Goal  status: ONGOING  5.  Pt will improve TUG score by at least 30 secs with RW to demo improved functional mobility and reduced fall risk.  Baseline:  Goal status: ONGOING  6.  Pt will negotiate ramp and curb with RW with SBA for increased community accessibility.  Baseline:  Goal status: ONGOING  ASSESSMENT:  CLINICAL IMPRESSION:  Aquatic PT session focused on gait training in 4' water depth with use of PT's forearms for bil. UE support.  Pt's Lt knee continues to hyperextend in water with need for moderate manual blocking to prevent hyperextension.  Gait speed remains significantly decreased in water due to pt's precise placement of Lt foot to minimize supination.  Pt able to perform step up and step down exercise for LLE strengthening (first time performing these exercises in pool).  Pt reported mild decrease in pain intensity in pool in 4' water depth.  Continue POC.    OBJECTIVE IMPAIRMENTS Abnormal gait, decreased activity tolerance, decreased balance, decreased coordination, decreased endurance, decreased knowledge of use of DME, decreased ROM, decreased strength, impaired sensation, impaired tone, and impaired UE functional use.   ACTIVITY LIMITATIONS carrying, lifting, bending, standing, squatting, stairs, transfers, bed mobility, and locomotion level  PARTICIPATION LIMITATIONS: meal prep, cleaning, laundry, driving, shopping, community activity, and yard work  [place] and severity of deficits  are also affecting patient's functional outcome.   REHAB POTENTIAL: Good  CLINICAL DECISION MAKING: Evolving/moderate complexity  EVALUATION COMPLEXITY: Moderate  PLAN: PT FREQUENCY: 2x/week for 8 weeks  PT DURATION: 8 weeks  PLANNED INTERVENTIONS: Therapeutic exercises, Therapeutic activity, Neuromuscular re-education, Balance training, Gait training, Patient/Family education, Self Care, Stair training, Orthotic/Fit training, DME instructions, Aquatic Therapy, and  Wheelchair mobility training  PLAN FOR NEXT SESSION:   Cont with gait training with RW; balance exercises/LLE SLS, strengthening exercises   Charlotte Hunter, [place], [place] [address]. [place], [place] [address] 05/14/2022, 8:08 AM

## 2022-05-15 ENCOUNTER — Ambulatory Visit: Payer: Medicare Other | Admitting: Physical Therapy

## 2022-05-15 ENCOUNTER — Encounter: Payer: Self-pay | Admitting: Occupational Therapy

## 2022-05-15 ENCOUNTER — Ambulatory Visit: Payer: Medicare Other | Admitting: Occupational Therapy

## 2022-05-15 VITALS — BP 163/82 | HR 66

## 2022-05-15 DIAGNOSIS — R2681 Unsteadiness on feet: Secondary | ICD-10-CM

## 2022-05-15 DIAGNOSIS — R208 Other disturbances of skin sensation: Secondary | ICD-10-CM

## 2022-05-15 DIAGNOSIS — I69154 Hemiplegia and 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 left non-dominant side: Secondary | ICD-10-CM | POA: Diagnosis not present

## 2022-05-15 DIAGNOSIS — R2689 Other abnormalities of gait and mobility: Secondary | ICD-10-CM

## 2022-05-15 DIAGNOSIS — R278 Other lack of coordination: Secondary | ICD-10-CM

## 2022-05-15 DIAGNOSIS — M6281 Muscle weakness (generalized): Secondary | ICD-10-CM

## 2022-05-15 DIAGNOSIS — R29818 Other symptoms and signs involving the nervous system: Secondary | ICD-10-CM

## 2022-05-15 DIAGNOSIS — I69318 Other symptoms and signs involving cognitive functions following cerebral infarction: Secondary | ICD-10-CM

## 2022-05-15 NOTE — Therapy (Signed)
OUTPATIENT OCCUPATIONAL THERAPY NEURO TREATMENT  Patient Name: Charlotte Hunter MRN: [id number] DOB:Jan 17, 1956, 67 y.o., female Today's Date: 05/15/2022  PCP: Su Grand, MD REFERRING PROVIDER: Bayard Hugger, NP    OT End of Session - 05/15/22 1236     Visit Number 19    Number of Visits 64    Date for OT Re-Evaluation 07/05/22    Authorization Type MCR, BC/BS    Authorization - Number of Visits 19    Progress Note Due on Visit 73    OT Start Time 1234    OT Stop Time 1314    OT Time Calculation (min) 40 min    Activity Tolerance Patient tolerated treatment well    Behavior During Therapy WFL for tasks assessed/performed                 Past Medical History:  Diagnosis Date   A-fib ([place])    Aortic insufficiency    Hypertension    Stroke [facility])    Past Surgical History:  Procedure Laterality Date   ABDOMINAL HYSTERECTOMY     ABLATION  05/03/2021   heart   c section  01/1968   KNEE SURGERY Right    meniscus   SHOULDER SURGERY Right 08/2014   Patient Active Problem List   Diagnosis Date Noted   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   ICH (intracerebral hemorrhage) ([place]) 10/01/2021   Paroxysmal atrial fibrillation ([address]) 09/29/2021   Stenosis of intracranial vessel 09/29/2021   Essential hypertension 09/29/2021   Blurry vision 09/29/2021   GERD (gastroesophageal reflux disease) 09/29/2021   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 09/25/2021    ONSET DATE: 12/14/2021 Referral date  REFERRING DIAG: I61.9 (ICD-10-CM) - Hemorrhagic stroke ([place])   THERAPY DIAG:  Hemiplegia and 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 left non-dominant side ([name])  Unsteadiness on feet  Muscle weakness (generalized)  Other lack of coordination  Other disturbances of skin sensation  Other symptoms and signs involving cognitive functions following cerebral infarction  Other symptoms and signs involving the nervous  system  Rationale for Evaluation and Treatment Rehabilitation  SUBJECTIVE:   SUBJECTIVE STATEMENT: Pt reports pain is a little better Pt accompanied by: self   PERTINENT HISTORY: 67 year old female here for evaluation of right thalamic intracerebral hemorrhage. Patient was admitted to the hospital in September 25, 2021 for new onset left-sided weakness headache and nausea. She was found to have intracerebral hemorrhage in the right thalamus. Patient was on Eliquis anticoagulation for atrial fibrillation at the time and this was reversed medically. She was started on hypertonic saline, blood pressure control in ICU admission. Patient went to inpatient rehabilitation. Then pt had HH therapies. Still has left-sided weakness and left hand and arm and leg pain. Has been to cardiology who recommended to hold anticoagulation for now as patient is not in atrial fibrillation.    Past medical history of atrial fibrillation on Eliquis aortic insufficiency, HTN, fibromyalgia  PRECAUTIONS: Fall and Other: NO lifting > 14 lbs, no driving  WEIGHT BEARING RESTRICTIONS: No  PAIN:  Are you having pain? Yes: NPRS scale: 4-5/10 Pain location: Lt shoulder, Lt torso, hand and wrist in spots  Pain description: burning, constant  Aggravating factors: cold weather Relieving factors: meds, voltaren gel  FALLS: Has patient fallen in last 6 months? No  LIVING ENVIRONMENT: Lives with: daughter, [name] Lives in: House/apartment Stairs: No - Ramp has been built at front entrance Has following equipment at home: w/c, quad cane large base, Walker -  2 wheeled, and bed side commode  PLOF: Independent; pt did gardening prior to CVA - was not employed prior to onset of CVA   PATIENT GOALS: get my hand better, return to gardening  OBJECTIVE: from eval  HAND DOMINANCE: Right  ADLs: Eating: dependent for cutting food, mod I eating Grooming: mod I  UB Dressing: min assist w/ sports bra and getting shirt overhead LB  Dressing: min assist w/ Lt shoe (d/t brace) and tying shoes Toileting: uses BSC - min assist for clothes management Bathing: seated - w/ min to assist for back, bottom, and feet Tub Shower transfers: grab bar, ? Tub transfer bench for shower (w/ high thresh hold) - close sup to CGA  IADLs: Shopping: daughter performing now Light housekeeping: daughter performing now Meal Prep: pt can heat up things in microwave, make sandwich, however daughter cooking Community mobility: relies on daughter or friend for transportation Medication management: does w/ pillbox and strategies Financial management: mod I  Handwriting:  denies change (difficulty stabilizing check w/ Lt hand)   MOBILITY STATUS:  primarily uses w/c, sup using walker shorter distances   UPPER EXTREMITY ROM:  RUE AROM WNL's.  LUE: Pt has approx 60* sh flexion w/ no control and compensations into abd, IR and forearm pronation. Pt has approx 75% ER, 50% IR. Full elbow flex, 75% elbow ext and supination, wrist WFL's, 90% finger flexion    HAND FUNCTION: Grip strength: Right: 68.1 lbs; Left: 14.5 lbs  COORDINATION: Box & Blocks Lt = 7,  9 hole peg unable LUE Pt able to pick up pen Lt hand but unable to manipulate  SENSATION: Light touch: absent except slight detection more proximal hand - unable to localize. Absent in fingertips Proprioception: Impaired    MUSCLE TONE: LUE: dystonia   COGNITION: Overall cognitive status:  inconsistent w/ comprehension of questions during eval, short term memory deficits  VISION: Subjective report: diplopia resolved Baseline vision: Bifocals Visual history:  none  VISION ASSESSMENT: Not tested  Patient has difficulty with following activities due to following visual impairments: Pt reports some blurriness w/ reading     TODAY'S TREATMENT: Seated edge of mat, weightbearing through LUE hand then elbow for body on arm movements and lateral trunk flexion, min facilitation and v.c   for proper alignment Seated on ball  with mod facilitation/ min assist from therapist, lifting alternate UE's, then closed chain chest press, lateral weight shifts for core and UE control. UBE x 6 min, level 1 for normal reciprocal movement pattern and UB conditioning with 1 rest break and hand slipped x 4  PATIENT EDUCATION: See above      HOME EXERCISE PROGRAM: 02/12/22: safety considerations d/t lack of sensation Lt hand, functional tasks to perform w/ Lt hand, shoulder HEP, and A/E recommendations (bath mitt, hands free blow dryer holder)    GOALS: Goals reviewed with patient? Yes  SHORT TERM GOALS: Target date: 03/09/22  Independent with initial HEP  Baseline: Goal status: MET  2.  Pt to cut food w/ A/E prn and report greater ease opening peel containers (yogurts, deli meat) Baseline:  Goal status: MET  3.  Pt to improve LUE functional use by demo improvement on Box & Blocks to 10 or more Baseline: 7 blocks Goal status: REVISED (03/11/22, 05/06/22: 8 blocks)   4.  Pt to perform UB dressing mod I level Baseline:  Goal status: MET  5.  Pt to perform low level functional reaching w/ min compensations to grasp/release 1" objects Baseline:  Goal status: MET  6.  Pt to consistently perform clothes management w/ min assist from LUE w/o LOB Baseline:  Goal status: MET  7.  Pt to verbalize understanding of safety strategies d/t lack of sensation Lt hand Baseline:  Goal status: MET  8.  Pt will be able to hold cup in Lt hand consistently and wash under Rt arm better Baseline:  Goal status: INITIAL     LONG TERM GOALS: Target date: 05/07/22  Independent w/ updated HEP  Baseline:  Goal status: IN PROGRESS  2.  Pt to improve LUE function as evidenced by performing Box & Blocks to 13 or more Baseline: 7 blocks Goal status: REVISED  3.  Pt to improve [place] hand as evidenced by performing 9 hole peg test in under 2 min Baseline: unable Goal status:  INITIAL  4.  Pt to be mod I for all ADLS (except sup for shower transfers)  Baseline:  Goal status: MET  5.  Pt to perform 70* sh flexion for functional light reaching with min compensations LUE Baseline:  Goal status: IN PROGRESS (Approx 60*)  6.  Grip strength Lt hand to be 20 lbs or greater Baseline: 14.5 lbs Goal status: IN PROGRESS (18.5 LBS)   7.  Pt will be mod I for gardening task w/ DME/AE prn Baseline:  Goal status: INITIAL   ASSESSMENT:  CLINICAL IMPRESSION: Pt is progressing towards goals with improving core stability and UE function.  PERFORMANCE DEFICITS: in functional skills including ADLs, IADLs, coordination, dexterity, proprioception, sensation, tone, ROM, strength, pain, Fine motor control, Gross motor control, mobility, balance, endurance, decreased knowledge of use of DME, and UE functional use, cognitive skills including memory, problem solving, and safety awareness.   IMPAIRMENTS: are limiting patient from ADLs, IADLs, leisure, and social participation.   CO-MORBIDITIES; may have co-morbidities  that affects occupational performance. Patient will benefit from skilled OT to address above impairments and improve overall function.  MODIFICATION OR ASSISTANCE TO COMPLETE EVALUATION: Min-Moderate modification of tasks or assist with assess necessary to complete an evaluation.  OT OCCUPATIONAL PROFILE AND HISTORY: Problem focused assessment: Including review of records relating to presenting problem.  CLINICAL DECISION MAKING: Moderate - several treatment options, min-mod task modification necessary  REHAB POTENTIAL: Good  EVALUATION COMPLEXITY: Low    PLAN:  OT FREQUENCY: 2x/week  OT DURATION: up to 8 additional weeks  PLANNED INTERVENTIONS: self care/ADL training, therapeutic exercise, therapeutic activity, neuromuscular re-education, passive range of motion, functional mobility training, aquatic therapy, splinting, fluidotherapy, moist heat,  patient/family education, cognitive remediation/compensation, visual/perceptual remediation/compensation, coping strategies training, and DME and/or AE instructions  RECOMMENDED OTHER SERVICES: will monitor for speech eval needs if cognition deficits impeding ADLS  CONSULTED AND AGREED WITH PLAN OF CARE: Patient and family member/caregiver  PLAN FOR NEXT SESSION:  continue neuro re-education LUE/trunk, consider weightbearing activities, modified quadraped, prone ,functional activities LUE    Theone Murdoch, OTR/L [phone] Phone: [phone] 4:35 PM 05/15/22  05/15/22 12:37 PM Phone [phone] FAX [phone]

## 2022-05-15 NOTE — Therapy (Signed)
OUTPATIENT PHYSICAL THERAPY NEURO TREATMENT NOTE     Patient Name: Charlotte Hunter MRN: [id number] DOB:30-Jan-1956, 67 y.o., female Today's Date: 05/15/2022   PCP: Audery Amel, MD REFERRING PROVIDER: Jones Bales, NP     PT End of Session - 05/15/22 1153     Visit Number 36    Number of Visits 52   renewal completed for 16 additional visits   Date for PT Re-Evaluation 05/31/22    Authorization Type Medicare    Authorization Time Period 01-03-22 - 04-03-22;  04-01-22 - 06-13-22    Progress Note Due on Visit 30    PT Start Time 1150    PT Stop Time 1230    PT Time Calculation (min) 40 min    Equipment Utilized During Treatment Gait belt   L AFO   Activity Tolerance Patient tolerated treatment well    Behavior During Therapy WFL for tasks assessed/performed                                     Past Medical History:  Diagnosis Date   A-fib [facility])    Aortic insufficiency    Hypertension    Stroke 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 09/25/2021    ONSET DATE: 09-25-21  REFERRING DIAG: I61.9 (ICD-10-CM) - Hemorrhagic stroke ([name])   THERAPY DIAG:  Hemiplegia and 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 left non-dominant side ([name])  Unsteadiness on feet  Other abnormalities of gait and mobility  Muscle weakness (generalized)  Other  lack of coordination  Rationale for Evaluation and Treatment Rehabilitation  SUBJECTIVE:                                                                                                                                                                                              SUBJECTIVE STATEMENT: Pt reports she has been sore and tired after her last few therapy sessions, but in a good way.  Pt reports she has to rest for a day or two in between sessions. Pt also reports she is having some soreness in her L shoulder and elbow today from how she has been sitting in the wheelchair in the waiting room. Pt reports she is doing more at home regarding cleaning (dishes, vacuuming, etc.) Pt reports that she saw the cardiologist yesterday and is on a heart monitor now and her BP was elevated during that appointment (around 160/85).  Pt accompanied by: [name], Charlotte Hunter  PERTINENT HISTORY: 67 year old female here for evaluation of right thalamic intracerebral hemorrhage. Patient is was admitted to the hospital in June 2023 for new onset left-sided weakness headache and nausea. She was found to have intracerebral hemorrhage in the right thalamus. Patient was on Eliquis anticoagulation for atrial fibrillation at the time and this was reversed medically. She was started on hypertonic saline, blood pressure control in ICU admission. Patient went to inpatient rehabilitation. Now patient back home. Still has left-sided weakness and left hand and arm and leg pain. Has been to cardiology who recommended to hold anticoagulation for now as patient is not in atrial fibrillation.   Past medical history of atrial fibrillation on Eliquis aortic insufficiency, hypertension, fibromyalgia  PAIN:  Are you having pain? Yes Location: Lt side of face & chin, edge of Lt arm (lateral side), foot and ankle and Lt buttock  Intensity: 1-2/10  Nerve pain, occasional shock sensation, "rope-like sensation";   04-09-22: not so much in  pain but more numbness in Lt side of face - states numbness is moving out    PRECAUTIONS: Fall  WEIGHT BEARING RESTRICTIONS No  FALLS: Has patient fallen in last 6 months? No  LIVING ENVIRONMENT: Lives with: daughter, Charlotte Hunter Lives in: House/apartment Stairs: No - Ramp has been built at front entrance Has following equipment at home: Chiropractor, Environmental consultant - 2 wheeled, and bed side commode  PLOF: Independent; pt did gardening prior to CVA - was not employed prior to onset of CVA  PATIENT GOALS "be restored back to where I was"   OBJECTIVE:   THER EX: Seated L ankle PROM in available planes of motion prior to initiation of gait this session.  Seated LLE HS curls on elevated mat table with use of towel for AAROM with focus on HS strengthening. Encouraged pt to perform in supine at home due to difficulty achieving full ROM in seated position.  THER ACT: Vitals:   05/15/22 1201  BP: (!) 163/82  Pulse: 66   Encouraged pt to continue to monitor her BP at home due to recent trend of elevation and to keep a log of her BP so she can let her cardiologist know if she continues to have a trend of elevated BP.  Discussed PT POC with patient as she is nearing end of this certification of POC. Pt wanting to continue to work towards increased smoothness of gait, increased ability to navigate stairs, and being able to get down to a low, stool near the ground so that she can engage in gardening tasks this spring. Also discussed aquatic therapy and whether or not patient will experience more benefit from continued therapy on land to work towards her goals vs continuing with aquatic therapy. Will discuss with primary PT.  GAIT: Attempted gait on treadmill this session to work on increasing pt endurance with ambulation as well as maintaining a constant speed as pt wishes to work on improving her "smoothness" with gait. Pt able to mount/dismount treadmill  with mod A for balance with use of RUE  support on arm of treadmill and HHA for LUE. Pt ambulates x 30 sec on treadmill at 0.1 mph with CGA from therapist due to pt anxiety. Pt does exhibit decreased L knee flexion during gait and occasional catching of L toe on treadmill. Pt with step-to gait pattern with very, very slow gait speed, declines an increase in speed of treadmill. Deferred further ambulation on treadmill this session following initial 30 sec due to pt difficulty with LLE knee flexion during gait and reluctance to increase speed of treadmill.   TherEx:  Access Code: [email or web address] - issued on 01-17-22 URL: [email or web address] Date: 01/17/2022 Prepared by: Ethelene Browns  Exercises - Seated Soleus Stretch  - 2-3 x daily - 7 x weekly - 1 sets - 2 reps - 15-20 secs  hold - Slant Board Gastrocnemius Stretch  - 3 x daily - 7 x weekly - 1 sets - 2 reps - 15-20 secs hold - Seated Gastroc Stretch with Strap  - 2-3 x daily - 7 x weekly - 1 sets - 2 reps - 15-20 hold   PATIENT EDUCATION:   Education details: continue to practice gait with RW at home with goal of increasing distance covered before needing to take a break Person educated: Patient Education method: Explanation, Demonstration Education comprehension: verbalized understanding, demonstration   HOME EXERCISE PROGRAM:  Previously issued Access Code: [email or web address] URL: [email or web address] Date: 01/08/2022 Prepared by: Ethelene Browns  Exercises - Bridge  - 1 x daily - 7 x weekly - 1 sets - 10 reps - 5 hold - Single Leg Bridge  - 1 x daily - 7 x weekly - 1 sets - 10 reps - 3 sec hold - Hooklying Clamshell with Resistance  - 1 x daily - 7 x weekly - 1 sets - 10 reps - 2-3 sec hold - Supine Hip Flexion with Resistance Loop  - 1 x daily - 7 x weekly - 3 sets - 10 reps - Supine Heel Slide  - 1 x daily - 7 x weekly - 3 sets - 10 reps - Stepping out/in with LEFT leg  - 1 x daily - 7 x weekly - 3 sets - 10 reps - Hooklying Hamstring Stretch with Strap  -  1 x daily - 7 x weekly - 1 sets - 1-2 reps - 20 sec hold  GOALS: Goals reviewed with patient? Yes  Goals assessed 04-01-22  Modified independent household amb. With RW.  Baseline: CGA with RW with household ambulation Goal status: Ongoing - not fully met  2.  Pt will increase Berg balance test score to >/= 41/56 to reduce fall risk.  Baseline:  33/56 on 04-01-22 Goal status: NEW  3.  Amb. 350' with RW with SBA for increased community accessibility.  Baseline:  Goal status: Ongoing - 54' with RW with CGA  4.  Negotiate 4 steps with use of handrail with SBA using step by step sequence. Baseline:  Goal status: ONGOING  5.  Pt will improve TUG score by at least 30 secs with RW to demo improved functional mobility and reduced fall risk.  Baseline:  Goal status: ONGOING  6.  Pt will negotiate ramp and curb with RW with SBA for increased community accessibility.  Baseline:  Goal status: ONGOING   NEW SHORT TERM GOALS:  TARGET DATE 05-03-22   Perform basic transfers with supervision from wheelchair to/from mat toward Rt or Lt side. Baseline:  CGA toward Lt side;  SBA toward Rt side Goal status:  NEW  2.  Amb. 230' with RW with SBA on flat, even surface to demo improved endurance.  Baseline:  32' with RW with CGA on 04-01-22  Goal status:  Ongoing  3. Pt will amb. From lobby into/out of clinic with RW with CGA.  Baseline:  currently using wheelchair  Goal status:  NEW  4.  Improve Berg score to >/= 37/56 to demo improved balance.  Baseline:  16/56 on 01-18-22; 31/56 on 02-28-22;  33/56 on 04-01-22  Goal status: NEW  5.  Perform step negotiation with use of Rt hand rail with CGA (4 steps) using step by step sequence.  Baseline:  min assist  Goal status:  Ongoing    6.  Assess TUG with RW. Baseline:  2" with [facility] Goal status:  NEW  7.  Pt will report decreased pain on Lt side of body to </= 4/10 after aquatic therapy. Baseline:  8/10 Goal status:  NEW   NEW LONG TERM  GOALS:  TARGET DATE:  05-31-22  Modified independent household amb. With RW.  Baseline: CGA with RW with household ambulation Goal status: Ongoing  2.  Pt will increase Berg balance test score to >/= 41/56 to reduce fall risk.  Baseline:  33/56 on 04-01-22 Goal status: NEW  3.  Amb. 350' with RW with SBA for increased community accessibility.  Baseline:  Goal status: Ongoing - 25' with RW with CGA  4.  Negotiate 4 steps with use of handrail with SBA using step by step sequence. Baseline:  Goal status: ONGOING  5.  Pt will improve TUG score by at least 30 secs with RW to demo improved functional mobility and reduced fall risk.  Baseline:  Goal status: ONGOING  6.  Pt will negotiate ramp and curb with RW with SBA for increased community accessibility.  Baseline:  Goal status: ONGOING  ASSESSMENT:  CLINICAL IMPRESSION: Emphasis of skilled PT session on discussing PT POC, trialing gait on treadmill, and working on LLE strength and ROM. Pt remains limited by ongoing L hemibody weakness leading to decreased ankle ROM and stability and decreased knee flexion during gait. Pt with difficulty performing gait on treadmill this session due to LLE weakness as well as increased anxiety with a novel task. Pt appears to remain motivated towards progressing with therapy and wanting to know more specific tasks she can focus on at home in order to demonstrate progress. Encouraged pt to work on her endurance and increasing her gait distance for now, will further discuss PT POC with primary PT before next scheduled session. Continue POC.     OBJECTIVE IMPAIRMENTS Abnormal gait, decreased activity tolerance, decreased balance, decreased coordination, decreased endurance, decreased knowledge of use of DME, decreased ROM, decreased strength, impaired sensation, impaired tone, and impaired UE functional use.   ACTIVITY LIMITATIONS carrying, lifting, bending, standing, squatting, stairs, transfers, bed  mobility, and locomotion level  PARTICIPATION LIMITATIONS: meal prep, cleaning, laundry, driving, shopping, community activity, and yard work  [place] and severity of deficits  are also affecting patient's functional outcome.   REHAB POTENTIAL: Good  CLINICAL DECISION MAKING: Evolving/moderate complexity  EVALUATION COMPLEXITY: Moderate  PLAN: PT FREQUENCY: 2x/week for 8 weeks  PT DURATION: 8 weeks  PLANNED INTERVENTIONS: Therapeutic exercises, Therapeutic activity, Neuromuscular re-education, Balance training, Gait training, Patient/Family education, Self Care, Stair training, Orthotic/Fit training, DME instructions, Aquatic Therapy, and Wheelchair mobility training  PLAN FOR NEXT SESSION:   Cont with gait training with  RW; balance exercises/LLE SLS, strengthening exercises; work on stairs, work on increasing gait speed and progressing to step-through gait   Excell Seltzer, PT, DPT, [facility] [address] [place], [place] [address] 05/15/2022, 2:20 PM

## 2022-05-20 ENCOUNTER — Encounter: Payer: Self-pay | Admitting: Physical Therapy

## 2022-05-20 ENCOUNTER — Ambulatory Visit: Payer: Medicare Other | Attending: Internal Medicine | Admitting: Physical Therapy

## 2022-05-20 DIAGNOSIS — R29818 Other symptoms and signs involving the nervous system: Secondary | ICD-10-CM | POA: Diagnosis present

## 2022-05-20 DIAGNOSIS — R278 Other lack of coordination: Secondary | ICD-10-CM | POA: Insufficient documentation

## 2022-05-20 DIAGNOSIS — I69154 Hemiplegia and 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 left non-dominant side: Secondary | ICD-10-CM | POA: Insufficient documentation

## 2022-05-20 DIAGNOSIS — R2689 Other abnormalities of gait and mobility: Secondary | ICD-10-CM | POA: Diagnosis present

## 2022-05-20 DIAGNOSIS — I69318 Other symptoms and signs involving cognitive functions following cerebral infarction: Secondary | ICD-10-CM | POA: Diagnosis present

## 2022-05-20 DIAGNOSIS — R2681 Unsteadiness on feet: Secondary | ICD-10-CM | POA: Diagnosis present

## 2022-05-20 DIAGNOSIS — R208 Other disturbances of skin sensation: Secondary | ICD-10-CM | POA: Diagnosis present

## 2022-05-20 DIAGNOSIS — M6281 Muscle weakness (generalized): Secondary | ICD-10-CM | POA: Insufficient documentation

## 2022-05-20 NOTE — Therapy (Signed)
OUTPATIENT PHYSICAL THERAPY NEURO TREATMENT NOTE     Patient Name: Charlotte Hunter MRN: [id number] DOB:[birth year], 67 y.o., female Today's Date: 05/20/2022   PCP: Su Grand, MD REFERRING PROVIDER: Bayard Hugger, NP     PT End of Session - 05/20/22 2000     Visit Number 37    Number of Visits 41   renewal completed for 16 additional visits   Date for PT Re-Evaluation 05/31/22    Authorization Type Medicare    Authorization Time Period 01-03-22 - 04-03-22;  04-01-22 - 06-13-22    Progress Note Due on Visit 11    PT Start Time 1405    PT Stop Time 1450    PT Time Calculation (min) 45 min    Equipment Utilized During Treatment Other (comment)   pt's air cast, aquatic step   Activity Tolerance Patient tolerated treatment well    Behavior During Therapy [facility] for tasks assessed/performed                                    Past Medical History:  Diagnosis Date   A-fib ([place])    Aortic insufficiency    Hypertension    Stroke [facility])    Past Surgical History:  Procedure Laterality Date   ABDOMINAL HYSTERECTOMY     ABLATION  05/03/2021   heart   c section  01/1968   KNEE SURGERY Right    meniscus   SHOULDER SURGERY Right 08/2014   Patient Active Problem List   Diagnosis Date Noted   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   ICH (intracerebral hemorrhage) ([place]) 10/01/2021   Paroxysmal atrial fibrillation ([place]) 09/29/2021   Stenosis of intracranial vessel 09/29/2021   Essential hypertension 09/29/2021   Blurry vision 09/29/2021   GERD (gastroesophageal reflux disease) 09/29/2021   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 09/25/2021    ONSET DATE: 09-25-21  REFERRING DIAG: I61.9 (ICD-10-CM) - Hemorrhagic stroke ([place])   THERAPY DIAG:  Hemiplegia and 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 left non-dominant side ([name])  Other abnormalities of gait and mobility  Unsteadiness on feet  Rationale for  Evaluation and Treatment Rehabilitation  SUBJECTIVE:                                                                                                                                                                                              SUBJECTIVE STATEMENT: Pt presents for aquatic therapy; pt reports she took about 8 steps without her walker at home yesterday - says her daughter, [name], got onto her  for doing this when she was home alone  Pt accompanied by: [name], [name]  PERTINENT HISTORY: 67 year old female here for evaluation of right thalamic intracerebral hemorrhage. Patient is was admitted to the hospital in June 2023 for new onset left-sided weakness headache and nausea. She was found to have intracerebral hemorrhage in the right thalamus. Patient was on Eliquis anticoagulation for atrial fibrillation at the time and this was reversed medically. She was started on hypertonic saline, blood pressure control in ICU admission. Patient went to inpatient rehabilitation. Now patient back home. Still has left-sided weakness and left hand and arm and leg pain. Has been to cardiology who recommended to hold anticoagulation for now as patient is not in atrial fibrillation.   Past medical history of atrial fibrillation on Eliquis aortic insufficiency, hypertension, fibromyalgia  PAIN:  Are you having pain? Yes Location: Lt side of face & chin, edge of Lt arm (lateral side), foot and ankle and Lt buttock  Intensity: 1-2/10  Nerve pain, occasional shock sensation, "rope-like sensation";   04-09-22: not so much in pain but more numbness in Lt side of face - states numbness is moving out    PRECAUTIONS: Fall  WEIGHT BEARING RESTRICTIONS No  FALLS: Has patient fallen in last 6 months? No  LIVING ENVIRONMENT: Lives with: daughter, [name] Lives in: House/apartment Stairs: No - Ramp has been built at front entrance Has following equipment at home: Programmer, multimedia, Environmental consultant - 2 wheeled, and  bed side commode  PLOF: Independent; pt did gardening prior to CVA - was not employed prior to onset of CVA  PATIENT GOALS "be restored back to where I was"   OBJECTIVE:    Aquatic therapy at [place] - pool temp 92 degrees  Patient seen for aquatic therapy today.  Treatment took place in water 3.5-4.5 feet deep depending upon activity.  Wheelchair was positioned to facing chair lift - pt transferred using squat pivot transfer toward Rt side with SBA.  Air cast was donned on her LLE with water shoes.  Pt's daughter, [name], accompanied pt to session.  Pt performed water walking forward 18' across pool with use of PT's HHA - pt held onto PT's forearms for UE support - approx. 18' x 4 reps; sidestepping 18' x 2 reps with UE support on PT's forearms Slow gait speed due to increased time needed for Lt foot positioning - PT's foot placed on pt's Lt foot to pronate foot and hold in neutral position   Pt performed step negotiation in pool with use of hand rail on Rt side with min assist; descended steps backwards with min to mod assist with RLE descending step first   Pt performed step ups onto aquatic step in pool  with LLE for strengthening - 10 reps with min to CGA  Pt performed step down exercise from aquatic step for LLE eccentric quad strengthening 10 reps with RUE support on pool edge and with min assist Pt performed Lt hamstring strengthening in seated position on bench - 10 reps - min assist needed to flex Lt knee to 90 degree position   Pt requires buoyancy of water for support for joint unloading and for reduced fall risk with gait training without device than can be safely performed on land. Water current produces perturbations for challenge for static & dynamic standing balance.  Buoyancy of water provides unweighting which assists with pain reduction.    TherEx:  Access Code: [email or web address] - issued on 01-17-22 URL: [email or web address] Date: 01/17/2022 Prepared by:  Nairi Oswald  Exercises - Seated Soleus Stretch  - 2-3 x daily - 7 x weekly - 1 sets - 2 reps - 15-20 secs  hold - Slant Board Gastrocnemius Stretch  - 3 x daily - 7 x weekly - 1 sets - 2 reps - 15-20 secs hold - Seated Gastroc Stretch with Strap  - 2-3 x daily - 7 x weekly - 1 sets - 2 reps - 15-20 hold   PATIENT EDUCATION:   Education details: continue to practice gait with RW at home despite ongoing anxiety and fear of falling Person educated: Patient Education method: Explanation, Demonstration Education comprehension: verbalized understanding, demonstration   HOME EXERCISE PROGRAM:  Previously issued Access Code: [email or web address] URL: [email or web address] Date: 01/08/2022 Prepared by: Maebelle Munroe  Exercises - Bridge  - 1 x daily - 7 x weekly - 1 sets - 10 reps - 5 hold - Single Leg Bridge  - 1 x daily - 7 x weekly - 1 sets - 10 reps - 3 sec hold - Hooklying Clamshell with Resistance  - 1 x daily - 7 x weekly - 1 sets - 10 reps - 2-3 sec hold - Supine Hip Flexion with Resistance Loop  - 1 x daily - 7 x weekly - 3 sets - 10 reps - Supine Heel Slide  - 1 x daily - 7 x weekly - 3 sets - 10 reps - Stepping out/in with LEFT leg  - 1 x daily - 7 x weekly - 3 sets - 10 reps - Hooklying Hamstring Stretch with Strap  - 1 x daily - 7 x weekly - 1 sets - 1-2 reps - 20 sec hold  GOALS: Goals reviewed with patient? Yes  Goals assessed 04-01-22  Modified independent household amb. With RW.  Baseline: CGA with RW with household ambulation Goal status: Ongoing - not fully met  2.  Pt will increase Berg balance test score to >/= 41/56 to reduce fall risk.  Baseline:  33/56 on 04-01-22 Goal status: NEW  3.  Amb. 350' with RW with SBA for increased community accessibility.  Baseline:  Goal status: Ongoing - 53' with RW with CGA  4.  Negotiate 4 steps with use of handrail with SBA using step by step sequence. Baseline:  Goal status: ONGOING  5.  Pt will improve TUG score by at  least 30 secs with RW to demo improved functional mobility and reduced fall risk.  Baseline:  Goal status: ONGOING  6.  Pt will negotiate ramp and curb with RW with SBA for increased community accessibility.  Baseline:  Goal status: ONGOING   NEW SHORT TERM GOALS:  TARGET DATE 05-03-22   Perform basic transfers with supervision from wheelchair to/from mat toward Rt or Lt side. Baseline:  CGA toward Lt side; SBA toward Rt side Goal status:  NEW  2.  Amb. 230' with RW with SBA on flat, even surface to demo improved endurance.  Baseline:  36' with RW with CGA on 04-01-22  Goal status:  Ongoing  3. Pt will amb. From lobby into/out of clinic with RW with CGA.  Baseline:  currently using wheelchair  Goal status:  NEW  4.  Improve Berg score to >/= 37/56 to demo improved balance.  Baseline:  16/56 on 01-18-22; 31/56 on 02-28-22;  33/56 on 04-01-22  Goal status: NEW  5.  Perform step negotiation with use of Rt hand rail with CGA (4 steps) using step by step sequence.  Baseline:  min assist  Goal status:  Ongoing    6.  Assess TUG with RW. Baseline:  2" with LBQC Goal status:  NEW  7.  Pt will report decreased pain on Lt side of body to </= 4/10 after aquatic therapy. Baseline:  8/10 Goal status:  NEW   NEW LONG TERM GOALS:  TARGET DATE:  05-31-22  Modified independent household amb. With RW.  Baseline: CGA with RW with household ambulation Goal status: Ongoing  2.  Pt will increase Berg balance test score to >/= 41/56 to reduce fall risk.  Baseline:  33/56 on 04-01-22 Goal status: NEW  3.  Amb. 350' with RW with SBA for increased community accessibility.  Baseline:  Goal status: Ongoing - 82' with RW with CGA  4.  Negotiate 4 steps with use of handrail with SBA using step by step sequence. Baseline:  Goal status: ONGOING  5.  Pt will improve TUG score by at least 30 secs with RW to demo improved functional mobility and reduced fall risk.  Baseline:  Goal status:  ONGOING  6.  Pt will negotiate ramp and curb with RW with SBA for increased community accessibility.  Baseline:  Goal status: ONGOING  ASSESSMENT:  CLINICAL IMPRESSION:  Aquatic PT session focused on gait training in 4' water depth with use of PT's forearms for bil. UE support.  Pt states she was able to flex Lt knee more in gait in 4' water depth, however, some flexion appeared to be due to buoyancy assisted ROM.   Gait speed remains significantly decreased in water due to need to pronate pt's Lt foot and secure in position  during stance.  Pt able to flex Lt knee in seated position but needed min assist to flex from approx. 75 degrees to 90 degrees.  Continue POC.    OBJECTIVE IMPAIRMENTS Abnormal gait, decreased activity tolerance, decreased balance, decreased coordination, decreased endurance, decreased knowledge of use of DME, decreased ROM, decreased strength, impaired sensation, impaired tone, and impaired UE functional use.   ACTIVITY LIMITATIONS carrying, lifting, bending, standing, squatting, stairs, transfers, bed mobility, and locomotion level  PARTICIPATION LIMITATIONS: meal prep, cleaning, laundry, driving, shopping, community activity, and yard work  [place] and severity of deficits  are also affecting patient's functional outcome.   REHAB POTENTIAL: Good  CLINICAL DECISION MAKING: Evolving/moderate complexity  EVALUATION COMPLEXITY: Moderate  PLAN: PT FREQUENCY: 2x/week for 8 weeks  PT DURATION: 8 weeks  PLANNED INTERVENTIONS: Therapeutic exercises, Therapeutic activity, Neuromuscular re-education, Balance training, Gait training, Patient/Family education, Self Care, Stair training, Orthotic/Fit training, DME instructions, Aquatic Therapy, and Wheelchair mobility training  PLAN FOR NEXT SESSION:   Cont with gait training with RW; balance exercises/LLE SLS, strengthening exercises   Winnell Bento, [place], [place] [address] [place], [place] [address] 05/20/2022, 8:04 PM

## 2022-05-21 ENCOUNTER — Telehealth: Payer: Self-pay | Admitting: Registered Nurse

## 2022-05-21 NOTE — Telephone Encounter (Signed)
Patient called states she need her handicap placard is going to expire 06/08/22 and patient is asking she could have it renewed . Patient lives in [place]

## 2022-05-22 ENCOUNTER — Encounter: Payer: Medicare Other | Admitting: Occupational Therapy

## 2022-05-22 ENCOUNTER — Ambulatory Visit: Payer: Medicare Other | Admitting: Physical Therapy

## 2022-05-22 DIAGNOSIS — I69154 Hemiplegia and 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 left non-dominant side: Secondary | ICD-10-CM | POA: Diagnosis not present

## 2022-05-22 DIAGNOSIS — R2681 Unsteadiness on feet: Secondary | ICD-10-CM

## 2022-05-22 DIAGNOSIS — M6281 Muscle weakness (generalized): Secondary | ICD-10-CM

## 2022-05-22 DIAGNOSIS — R2689 Other abnormalities of gait and mobility: Secondary | ICD-10-CM

## 2022-05-22 NOTE — Therapy (Signed)
OUTPATIENT PHYSICAL THERAPY NEURO TREATMENT NOTE     Patient Name: Charlotte Hunter MRN: [id number] DOB:06-11-1955, 67 y.o., female Today's Date: 05/22/2022   PCP: Charlotte Grand, MD REFERRING PROVIDER: Bayard Hugger, NP     PT End of Session - 05/22/22 1151     Visit Number 38    Number of Visits 15   renewal completed for 16 additional visits   Date for PT Re-Evaluation 05/31/22    Authorization Type Medicare    Authorization Time Period 01-03-22 - 04-03-22;  04-01-22 - 06-13-22    Progress Note Due on Visit 83    PT Start Time 2458    PT Stop Time 1239    PT Time Calculation (min) 54 min    Equipment Utilized During Treatment Gait belt   L AFO   Activity Tolerance Patient tolerated treatment well    Behavior During Therapy WFL for tasks assessed/performed                                     Past Medical History:  Diagnosis Date   A-fib [facility])    Aortic insufficiency    Hypertension    Stroke [facility] Hunter)    Past Surgical History:  Procedure Laterality Date   ABDOMINAL HYSTERECTOMY     ABLATION  05/03/2021   heart   c section  01/1968   KNEE SURGERY Right    meniscus   SHOULDER SURGERY Right 08/2014   Patient Active Problem List   Diagnosis Date Noted   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   ICH (intracerebral hemorrhage) (Charlotte Hunter) 10/01/2021   Paroxysmal atrial fibrillation (Charlotte Hunter) 09/29/2021   Stenosis of intracranial vessel 09/29/2021   Essential hypertension 09/29/2021   Blurry vision 09/29/2021   GERD (gastroesophageal reflux disease) 09/29/2021   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 09/25/2021    ONSET DATE: 09-25-21  REFERRING DIAG: I61.9 (ICD-10-CM) - Hemorrhagic stroke (Charlotte Hunter)   THERAPY DIAG:  Hemiplegia and 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 left non-dominant side ([name])  Unsteadiness on feet  Other abnormalities of gait and mobility  Muscle weakness  (generalized)  Rationale for Evaluation and Treatment Rehabilitation  SUBJECTIVE:                                                                                                                                                                                              SUBJECTIVE STATEMENT: Pt received seated in chair in clinic lobby, reports that she walked into the lobby from her friend's car today! Pt also not using  the clinic wheelchair and walks back to the therapy gym from the clinic lobby for her session. Pt reports having a "breakthrough" and is more willing to push herself this session.  Pt accompanied by: Self  PERTINENT HISTORY: 67 year old female here for evaluation of right thalamic intracerebral hemorrhage. Patient is was admitted to the hospital in June 2023 for new onset left-sided weakness headache and nausea. She was found to have intracerebral hemorrhage in the right thalamus. Patient was on Eliquis anticoagulation for atrial fibrillation at the time and this was reversed medically. She was started on hypertonic saline, blood pressure control in ICU admission. Patient went to inpatient rehabilitation. Now patient back home. Still has left-sided weakness and left hand and arm and leg pain. Has been to cardiology who recommended to hold anticoagulation for now as patient is not in atrial fibrillation.   Past medical history of atrial fibrillation on Eliquis aortic insufficiency, hypertension, fibromyalgia  PAIN:  Are you having pain? Yes Location: Lt side of face & chin, edge of Lt arm (lateral side), foot and ankle and Lt buttock  Intensity: 1-2/10  Nerve pain, occasional shock sensation, "rope-like sensation";   04-09-22: not so much in pain but more numbness in Lt side of face - states numbness is moving out    PRECAUTIONS: Fall  WEIGHT BEARING RESTRICTIONS No  FALLS: Has patient fallen in last 6 months? No  LIVING ENVIRONMENT: Lives with: daughter, Charlotte Hunter Lives in:  House/apartment Stairs: No - Ramp has been built at front entrance Has following equipment at home: Chiropractor, Environmental consultant - 2 wheeled, and bed side commode  PLOF: Independent; pt did gardening prior to CVA - was not employed prior to onset of CVA  PATIENT GOALS "be restored back to where I was"   OBJECTIVE:    GAIT: Gait pattern: decreased hip/knee flexion- Left, decreased ankle dorsiflexion- Left, circumduction- Left, and genu recurvatum- Left Distance walked: 90 ft x 2 Assistive device utilized: Walker - 2 wheeled Level of assistance: SBA Comments: one instance of near fall requiring min A to recover due to taking too large of a step with RLE and LLE twisting behind pt, pt needs assist to correct placement of LLE to recover balance and standing rest break before continuing  STAIRS:  Level of Assistance: Min A  Stair Negotiation Technique: Step to Pattern Forwards with Bilateral Rails (use of therapist arm for support when descending)  Number of Stairs: 4   Height of Stairs: 6  Comments: L knee hyperextension in stance; some encouragement needed due to anxiety  THER ACT: Discussed PT POC ending at end of February and will continue to discuss this plan with primary PT. Also discussed modifications to pt being able to garden. Pt wanting to be able to walk outdoors with her RW across uneven ground of grass, gravel, pavers, etc with her RW and wants to be able to sit on a wheeled gardening bench. Educated pt that therapy would recommend pt not sit on this device as it has wheels as does not lock but rather she sit in her wheelchair (locked) in the yard or in a chair without wheels. Pt also wanting to be able to take care of flower pots hanging from her fence. Discussed pt having assist to set up a table with gardening supplies so that she is not bending and lifting heavy objects, etc or having pots placed on a table where she can work on gardening. Will continue to assist pt with  problem  solving how she can engage in gardening again but in a safe manner.  Pt needing to toilet at end of session, ambulation to clinic bathroom with RW and SBA. Toilet transfer with intermittent use of grab bar and/or RW at SBA level. Pt is independent for management of clothing and for pericare. Pt returned to lobby at end of session.     TherEx:  Access Code: [email or web address] - issued on 01-17-22 URL: [email or web address] Date: 01/17/2022 Prepared by: Ethelene Browns  Exercises - Seated Soleus Stretch  - 2-3 x daily - 7 x weekly - 1 sets - 2 reps - 15-20 secs  hold - Slant Board Gastrocnemius Stretch  - 3 x daily - 7 x weekly - 1 sets - 2 reps - 15-20 secs hold - Seated Gastroc Stretch with Strap  - 2-3 x daily - 7 x weekly - 1 sets - 2 reps - 15-20 hold   PATIENT EDUCATION:   Education details: continue to practice gait with RW at home and continue to ambulate in/out of therapy appointments Person educated: Patient Education method: Explanation, Demonstration Education comprehension: verbalized understanding, demonstration   HOME EXERCISE PROGRAM:  Previously issued Access Code: [email or web address] URL: [email or web address] Date: 01/08/2022 Prepared by: Ethelene Browns  Exercises - Bridge  - 1 x daily - 7 x weekly - 1 sets - 10 reps - 5 hold - Single Leg Bridge  - 1 x daily - 7 x weekly - 1 sets - 10 reps - 3 sec hold - Hooklying Clamshell with Resistance  - 1 x daily - 7 x weekly - 1 sets - 10 reps - 2-3 sec hold - Supine Hip Flexion with Resistance Loop  - 1 x daily - 7 x weekly - 3 sets - 10 reps - Supine Heel Slide  - 1 x daily - 7 x weekly - 3 sets - 10 reps - Stepping out/in with LEFT leg  - 1 x daily - 7 x weekly - 3 sets - 10 reps - Hooklying Hamstring Stretch with Strap  - 1 x daily - 7 x weekly - 1 sets - 1-2 reps - 20 sec hold  GOALS: Goals reviewed with patient? Yes  Goals assessed 04-01-22  Modified independent household amb. With RW.  Baseline: CGA  with RW with household ambulation Goal status: Ongoing - not fully met  2.  Pt will increase Berg balance test score to >/= 41/56 to reduce fall risk.  Baseline:  33/56 on 04-01-22 Goal status: NEW  3.  Amb. 350' with RW with SBA for increased community accessibility.  Baseline:  Goal status: Ongoing - 37' with RW with CGA  4.  Negotiate 4 steps with use of handrail with SBA using step by step sequence. Baseline:  Goal status: ONGOING  5.  Pt will improve TUG score by at least 30 secs with RW to demo improved functional mobility and reduced fall risk.  Baseline:  Goal status: ONGOING  6.  Pt will negotiate ramp and curb with RW with SBA for increased community accessibility.  Baseline:  Goal status: ONGOING   NEW SHORT TERM GOALS:  TARGET DATE 05-03-22   Perform basic transfers with supervision from wheelchair to/from mat toward Rt or Lt side. Baseline:  CGA toward Lt side; SBA toward Rt side Goal status:  NEW  2.  Amb. 230' with RW with SBA on flat, even surface to demo improved endurance.  Baseline:  41' with RW with CGA on 04-01-22  Goal status:  Ongoing  3. Pt will amb. From lobby into/out of clinic with RW with CGA.  Baseline:  currently using wheelchair  Goal status:  NEW  4.  Improve Berg score to >/= 37/56 to demo improved balance.  Baseline:  16/56 on 01-18-22; 31/56 on 02-28-22;  33/56 on 04-01-22  Goal status: NEW  5.  Perform step negotiation with use of Rt hand rail with CGA (4 steps) using step by step sequence.  Baseline:  min assist  Goal status:  Ongoing    6.  Assess TUG with RW. Baseline:  2" with LB[facility] Goal status:  NEW  7.  Pt will report decreased pain on Lt side of body to </= 4/10 after aquatic therapy. Baseline:  8/10 Goal status:  NEW   NEW LONG TERM GOALS:  TARGET DATE:  05-31-22  Modified independent household amb. With RW.  Baseline: CGA with RW with household ambulation Goal status: Ongoing  2.  Pt will increase Berg balance  test score to >/= 41/56 to reduce fall risk.  Baseline:  33/56 on 04-01-22 Goal status: NEW  3.  Amb. 350' with RW with SBA for increased community accessibility.  Baseline:  Goal status: Ongoing - 51' with RW with CGA  4.  Negotiate 4 steps with use of handrail with SBA using step by step sequence. Baseline:  Goal status: ONGOING  5.  Pt will improve TUG score by at least 30 secs with RW to demo improved functional mobility and reduced fall risk.  Baseline:  Goal status: ONGOING  6.  Pt will negotiate ramp and curb with RW with SBA for increased community accessibility.  Baseline:  Goal status: ONGOING  ASSESSMENT:  CLINICAL IMPRESSION: Emphasis of skilled PT session on working on stair navigation with patient as well as discussing PT POC and safe gardening techniques/modifications. Pt does exhibit improved motivation and participation in session this date and is more willing to push herself outside of her comfort zone. Pt with onset of fatigue following ambulation in/out of her therapy appointment this date and after working on stairs. Pt does continue to exhibit decreased L hemibody strength leading to decreased balance and increased fall risk. Continue POC.   OBJECTIVE IMPAIRMENTS Abnormal gait, decreased activity tolerance, decreased balance, decreased coordination, decreased endurance, decreased knowledge of use of DME, decreased ROM, decreased strength, impaired sensation, impaired tone, and impaired UE functional use.   ACTIVITY LIMITATIONS carrying, lifting, bending, standing, squatting, stairs, transfers, bed mobility, and locomotion level  PARTICIPATION LIMITATIONS: meal prep, cleaning, laundry, driving, shopping, community activity, and yard work  [place] and severity of deficits  are also affecting patient's functional outcome.   REHAB POTENTIAL: Good  CLINICAL DECISION MAKING: Evolving/moderate complexity  EVALUATION COMPLEXITY:  Moderate  PLAN: PT FREQUENCY: 2x/week for 8 weeks  PT DURATION: 8 weeks  PLANNED INTERVENTIONS: Therapeutic exercises, Therapeutic activity, Neuromuscular re-education, Balance training, Gait training, Patient/Family education, Self Care, Stair training, Orthotic/Fit training, DME instructions, Aquatic Therapy, and Wheelchair mobility training  PLAN FOR NEXT SESSION:   Cont with gait training with RW; balance exercises/LLE SLS, strengthening,  walking on gravel, outdoors across grass/uneven ground, stepping over edging into garden   Excell Seltzer, PT, DPT, [place] [address] [place], [place] [address] 05/22/2022, 1:01 PM

## 2022-05-22 NOTE — Telephone Encounter (Signed)
Patient said she will come by and pick up and is requesting it be a permanent one.

## 2022-05-27 ENCOUNTER — Ambulatory Visit: Payer: Medicare Other | Admitting: Physical Therapy

## 2022-05-27 ENCOUNTER — Encounter: Payer: Self-pay | Admitting: Physical Therapy

## 2022-05-27 DIAGNOSIS — I69154 Hemiplegia and 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 left non-dominant side: Secondary | ICD-10-CM | POA: Diagnosis not present

## 2022-05-27 DIAGNOSIS — R2681 Unsteadiness on feet: Secondary | ICD-10-CM

## 2022-05-27 DIAGNOSIS — R2689 Other abnormalities of gait and mobility: Secondary | ICD-10-CM

## 2022-05-27 NOTE — Therapy (Signed)
OUTPATIENT PHYSICAL THERAPY NEURO TREATMENT NOTE     Patient Name: Jodi Mcgurl MRN: [id number] DOB:1955-12-28, 67 y.o., female Today's Date: 05/27/2022   PCP: Su Grand, MD REFERRING PROVIDER: Bayard Hugger, NP     PT End of Session - 05/27/22 1902     Visit Number 39    Number of Visits 28   renewal completed for 16 additional visits   Date for PT Re-Evaluation 05/31/22    Authorization Type Medicare    Authorization Time Period 01-03-22 - 04-03-22;  04-01-22 - 06-13-22    Progress Note Due on Visit 73    PT Start Time 1400    PT Stop Time 1450    PT Time Calculation (min) 50 min    Equipment Utilized During Treatment Other (comment)   pt wore her air cast on LLE; aquatic step   Activity Tolerance Patient tolerated treatment well    Behavior During Therapy WFL for tasks assessed/performed                                    Past Medical History:  Diagnosis Date   A-fib [facility])    Aortic insufficiency    Hypertension    Stroke 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   ICH (intracerebral hemorrhage) ([place]) 10/01/2021   Paroxysmal atrial fibrillation ([place]) 09/29/2021   Stenosis of intracranial vessel 09/29/2021   Essential hypertension 09/29/2021   Blurry vision 09/29/2021   GERD (gastroesophageal reflux disease) 09/29/2021   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 09/25/2021    ONSET DATE: 09-25-21  REFERRING DIAG: I61.9 (ICD-10-CM) - Hemorrhagic stroke ([place])   THERAPY DIAG:  Hemiplegia and 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 left non-dominant side ([name])  Other abnormalities of gait and mobility  Unsteadiness on  feet  Rationale for Evaluation and Treatment Rehabilitation  SUBJECTIVE:                                                                                                                                                                                              SUBJECTIVE STATEMENT: Pt presents for aquatic therapy; pt states she had a little bit of a set back this weekend due to conflict with  her daughter - got very upset  Pt accompanied by: [name], [name]  PERTINENT HISTORY: 67 year old female here for evaluation of right thalamic intracerebral hemorrhage. Patient is was admitted to the hospital in June 2023 for new onset left-sided weakness headache and nausea. She was found to have intracerebral hemorrhage in the right thalamus. Patient was on Eliquis anticoagulation for atrial fibrillation at the time and this was reversed medically. She was started on hypertonic saline, blood pressure control in ICU admission. Patient went to inpatient rehabilitation. Now patient back home. Still has left-sided weakness and left hand and arm and leg pain. Has been to cardiology who recommended to hold anticoagulation for now as patient is not in atrial fibrillation.   Past medical history of atrial fibrillation on Eliquis aortic insufficiency, hypertension, fibromyalgia  PAIN:  Are you having pain? Yes Location: Lt side of face & chin, edge of Lt arm (lateral side), foot and ankle and Lt buttock  Intensity: 1-2/10  Nerve pain, occasional shock sensation, "rope-like sensation";   04-09-22: not so much in pain but more numbness in Lt side of face - states numbness is moving out    PRECAUTIONS: Fall  WEIGHT BEARING RESTRICTIONS No  FALLS: Has patient fallen in last 6 months? No  LIVING ENVIRONMENT: Lives with: daughter, [name] Lives in: House/apartment Stairs: No - Ramp has been built at front entrance Has following equipment at home: Programmer, multimedia, Environmental consultant - 2 wheeled, and bed side  commode  PLOF: Independent; pt did gardening prior to CVA - was not employed prior to onset of CVA  PATIENT GOALS "be restored back to where I was"   OBJECTIVE:    Aquatic therapy at [place] - pool temp 90 degrees  Patient seen for aquatic therapy today.  Treatment took place in water 3.5-4.5 feet deep depending upon activity.  Wheelchair was positioned to facing chair lift - pt transferred using squat pivot transfer toward Rt side with SBA.  Air cast was donned on her LLE with water shoes.   Runner's stretch for Lt hamstring/gastroc stretching with min assist - 20 sec hold x 1 rep  Pt performed tap ups 5 reps to 1st step in pool with RLE for LLE closed chain strengthening (knee blocked by PT to prevent hyperextension); 5 reps to 2nd step with RLE for increased weight bearing LLE and closed chain strengthening  Squats bil. LE's 5 reps;  LLE unilateral squats with min to mod HHA 5 reps  Pt performed step negotiation in pool with use of hand rail on Rt side with min assist; descended steps backwards with min to mod assist with RLE descending step first; negotiated 1 step with forward ascension and forward descension with min assist to ensure neutral position on Lt foot in stance   Pt performed step ups onto first step in pool with LLE for strengthening - 10 reps with min to CGA  Seated position on bench in pool - pt performed hip abduction/adduction with Lt knee flexed at 90 degrees (min assist to maintain this flexed position)  Pt performed Lt hamstring strengthening in seated position on bench - 20 reps - min assist needed to flex Lt knee to 90 degree position   Pt gait trained approx. 18' x 2 reps across width of pool with min hand held assist - initially min assist to position Lt foot in neutral but after only 3-4 steps Lt foot was not supinating as it had in previous aquatic PT sessions  Pt requires buoyancy  of water for support for joint unloading and for reduced fall risk with  gait training without device than can be safely performed on land. Water current produces perturbations for challenge for static & dynamic standing balance.  Buoyancy of water provides unweighting which assists with pain reduction.    TherEx:  Access Code: [email or web address] - issued on 01-17-22 URL: [email or web address] Date: 01/17/2022 Prepared by: Ethelene Browns  Exercises - Seated Soleus Stretch  - 2-3 x daily - 7 x weekly - 1 sets - 2 reps - 15-20 secs  hold - Slant Board Gastrocnemius Stretch  - 3 x daily - 7 x weekly - 1 sets - 2 reps - 15-20 secs hold - Seated Gastroc Stretch with Strap  - 2-3 x daily - 7 x weekly - 1 sets - 2 reps - 15-20 hold   PATIENT EDUCATION:   Education details: continue to practice gait with RW at home despite ongoing anxiety and fear of falling Person educated: Patient Education method: Explanation, Demonstration Education comprehension: verbalized understanding, demonstration   HOME EXERCISE PROGRAM:  Previously issued Access Code: [email or web address] URL: [email or web address] Date: 01/08/2022 Prepared by: Ethelene Browns  Exercises - Bridge  - 1 x daily - 7 x weekly - 1 sets - 10 reps - 5 hold - Single Leg Bridge  - 1 x daily - 7 x weekly - 1 sets - 10 reps - 3 sec hold - Hooklying Clamshell with Resistance  - 1 x daily - 7 x weekly - 1 sets - 10 reps - 2-3 sec hold - Supine Hip Flexion with Resistance Loop  - 1 x daily - 7 x weekly - 3 sets - 10 reps - Supine Heel Slide  - 1 x daily - 7 x weekly - 3 sets - 10 reps - Stepping out/in with LEFT leg  - 1 x daily - 7 x weekly - 3 sets - 10 reps - Hooklying Hamstring Stretch with Strap  - 1 x daily - 7 x weekly - 1 sets - 1-2 reps - 20 sec hold  GOALS: Goals reviewed with patient? Yes  Goals assessed 04-01-22  Modified independent household amb. With RW.  Baseline: CGA with RW with household ambulation Goal status: Ongoing - not fully met  2.  Pt will increase Berg balance test score to >/=  41/56 to reduce fall risk.  Baseline:  33/56 on 04-01-22 Goal status: NEW  3.  Amb. 350' with RW with SBA for increased community accessibility.  Baseline:  Goal status: Ongoing - 31' with RW with CGA  4.  Negotiate 4 steps with use of handrail with SBA using step by step sequence. Baseline:  Goal status: ONGOING  5.  Pt will improve TUG score by at least 30 secs with RW to demo improved functional mobility and reduced fall risk.  Baseline:  Goal status: ONGOING  6.  Pt will negotiate ramp and curb with RW with SBA for increased community accessibility.  Baseline:  Goal status: ONGOING   NEW SHORT TERM GOALS:  TARGET DATE 05-03-22   Perform basic transfers with supervision from wheelchair to/from mat toward Rt or Lt side. Baseline:  CGA toward Lt side; SBA toward Rt side Goal status:  NEW  2.  Amb. 230' with RW with SBA on flat, even surface to demo improved endurance.  Baseline:  81' with RW with CGA on 04-01-22  Goal status:  Ongoing  3. Pt will amb. From lobby into/out of clinic with RW with CGA.  Baseline:  currently using wheelchair  Goal status:  NEW  4.  Improve Berg score to >/= 37/56 to demo improved balance.  Baseline:  16/56 on 01-18-22; 31/56 on 02-28-22;  33/56 on 04-01-22  Goal status: NEW  5.  Perform step negotiation with use of Rt hand rail with CGA (4 steps) using step by step sequence.  Baseline:  min assist  Goal status:  Ongoing    6.  Assess TUG with RW. Baseline:  2" with [facility] Goal status:  NEW  7.  Pt will report decreased pain on Lt side of body to </= 4/10 after aquatic therapy. Baseline:  8/10 Goal status:  NEW   NEW LONG TERM GOALS:  TARGET DATE:  05-31-22  Modified independent household amb. With RW.  Baseline: CGA with RW with household ambulation Goal status: Ongoing  2.  Pt will increase Berg balance test score to >/= 41/56 to reduce fall risk.  Baseline:  33/56 on 04-01-22 Goal status: NEW  3.  Amb. 350' with RW with SBA  for increased community accessibility.  Baseline:  Goal status: Ongoing - 6' with RW with CGA  4.  Negotiate 4 steps with use of handrail with SBA using step by step sequence. Baseline:  Goal status: ONGOING  5.  Pt will improve TUG score by at least 30 secs with RW to demo improved functional mobility and reduced fall risk.  Baseline:  Goal status: ONGOING  6.  Pt will negotiate ramp and curb with RW with SBA for increased community accessibility.  Baseline:  Goal status: ONGOING  ASSESSMENT:  CLINICAL IMPRESSION:  Aquatic PT session focused on LLE strengthening exercises in open and closed chain position.  Lt hamstring AAROM buoyancy resisted exercises performed in seated position on bench in pool area.  Pt able to flex Lt knee in seated position but needed min assist to flex from approx. 75 degrees to 90 degrees.  Pt's Lt foot exhibited much less tone in today's aquatic PT session with minimal to none supination occurring in swing and stance phase of gait compared to that in previous aquatic PT sessions.  Pt did report nerve pain in Lt ankle during session but was able to continue with exercises.  Pt continued to decline exercising in supine position due to not wanting to get her hair wet. Continue POC.    OBJECTIVE IMPAIRMENTS Abnormal gait, decreased activity tolerance, decreased balance, decreased coordination, decreased endurance, decreased knowledge of use of DME, decreased ROM, decreased strength, impaired sensation, impaired tone, and impaired UE functional use.   ACTIVITY LIMITATIONS carrying, lifting, bending, standing, squatting, stairs, transfers, bed mobility, and locomotion level  PARTICIPATION LIMITATIONS: meal prep, cleaning, laundry, driving, shopping, community activity, and yard work  [place] and severity of deficits  are also affecting patient's functional outcome.   REHAB POTENTIAL: Good  CLINICAL DECISION MAKING: Evolving/moderate  complexity  EVALUATION COMPLEXITY: Moderate  PLAN: PT FREQUENCY: 2x/week for 8 weeks  PT DURATION: 8 weeks  PLANNED INTERVENTIONS: Therapeutic exercises, Therapeutic activity, Neuromuscular re-education, Balance training, Gait training, Patient/Family education, Self Care, Stair training, Orthotic/Fit training, DME instructions, Aquatic Therapy, and Wheelchair mobility training  PLAN FOR NEXT SESSION:   check LTG's and renew - 10th visit progress note due; Cont with gait training with RW; balance exercises/LLE SLS, strengthening exercises   Damauri Minion, [place], [place] [address] [place], [place] [address] 05/27/2022, 7:09 PM

## 2022-05-29 ENCOUNTER — Encounter: Payer: Self-pay | Admitting: Occupational Therapy

## 2022-05-29 ENCOUNTER — Ambulatory Visit: Payer: Medicare Other | Admitting: Occupational Therapy

## 2022-05-29 ENCOUNTER — Ambulatory Visit: Payer: Medicare Other | Admitting: Physical Therapy

## 2022-05-29 VITALS — BP 144/78 | HR 75

## 2022-05-29 DIAGNOSIS — M6281 Muscle weakness (generalized): Secondary | ICD-10-CM

## 2022-05-29 DIAGNOSIS — R2681 Unsteadiness on feet: Secondary | ICD-10-CM

## 2022-05-29 DIAGNOSIS — I69154 Hemiplegia and 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 left non-dominant side: Secondary | ICD-10-CM

## 2022-05-29 DIAGNOSIS — R2689 Other abnormalities of gait and mobility: Secondary | ICD-10-CM

## 2022-05-29 DIAGNOSIS — R208 Other disturbances of skin sensation: Secondary | ICD-10-CM

## 2022-05-29 DIAGNOSIS — R278 Other lack of coordination: Secondary | ICD-10-CM

## 2022-05-29 NOTE — Therapy (Signed)
OUTPATIENT OCCUPATIONAL THERAPY NEURO TREATMENT  Patient Name: Charlotte Hunter MRN: [id number] DOB:01/12/1956, 67 y.o., female Today's Date: 05/29/2022  PCP: Su Grand, MD REFERRING PROVIDER: Bayard Hugger, NP    OT End of Session - 05/29/22 1329     Visit Number 20    Number of Visits 29    Date for OT Re-Evaluation 07/05/22    Authorization Type MCR, BC/BS    Authorization - Number of Visits 20    Progress Note Due on Visit 57    OT Start Time 1320    OT Stop Time 1400    OT Time Calculation (min) 40 min    Activity Tolerance Patient tolerated treatment well    Behavior During Therapy WFL for tasks assessed/performed                 Past Medical History:  Diagnosis Date   A-fib ([place])    Aortic insufficiency    Hypertension    Stroke [facility])    Past Surgical History:  Procedure Laterality Date   ABDOMINAL HYSTERECTOMY     ABLATION  05/03/2021   heart   c section  01/1968   KNEE SURGERY Right    meniscus   SHOULDER SURGERY Right 08/2014   Patient Active Problem List   Diagnosis Date Noted   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   ICH (intracerebral hemorrhage) ([place]) 10/01/2021   Paroxysmal atrial fibrillation ([place]) 09/29/2021   Stenosis of intracranial vessel 09/29/2021   Essential hypertension 09/29/2021   Blurry vision 09/29/2021   GERD (gastroesophageal reflux disease) 09/29/2021   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 09/25/2021    ONSET DATE: 12/14/2021 Referral date  REFERRING DIAG: I61.9 (ICD-10-CM) - Hemorrhagic stroke ([place])   THERAPY DIAG:  Hemiplegia and 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 left non-dominant side ([name])  Unsteadiness on feet  Muscle weakness (generalized)  Other lack of coordination  Other disturbances of skin sensation  Rationale for Evaluation and Treatment Rehabilitation  SUBJECTIVE:   SUBJECTIVE STATEMENT: Pt reports pain fluctuates but sensation is  slowly improving Pt accompanied by: self   PERTINENT HISTORY: 67 year old female here for evaluation of right thalamic intracerebral hemorrhage. Patient was admitted to the hospital in September 25, 2021 for new onset left-sided weakness headache and nausea. She was found to have intracerebral hemorrhage in the right thalamus. Patient was on Eliquis anticoagulation for atrial fibrillation at the time and this was reversed medically. She was started on hypertonic saline, blood pressure control in ICU admission. Patient went to inpatient rehabilitation. Then pt had HH therapies. Still has left-sided weakness and left hand and arm and leg pain. Has been to cardiology who recommended to hold anticoagulation for now as patient is not in atrial fibrillation.    Past medical history of atrial fibrillation on Eliquis aortic insufficiency, HTN, fibromyalgia  PRECAUTIONS: Fall and Other: NO lifting > 14 lbs, no driving  WEIGHT BEARING RESTRICTIONS: No  PAIN:  Are you having pain? Yes: NPRS scale: 4-5/10 Pain location: Lt shoulder, Lt torso, hand (ulnar side)  Pain description: burning, constant  Aggravating factors: cold weather Relieving factors: meds, voltaren gel  FALLS: Has patient fallen in last 6 months? No  LIVING ENVIRONMENT: Lives with: daughter, [name] Lives in: House/apartment Stairs: No - Ramp has been built at front entrance Has following equipment at home: w/c, quad cane large base, Walker - 2 wheeled, and bed side commode  PLOF: Independent; pt did gardening prior to CVA - was not employed prior  to onset of CVA   PATIENT GOALS: get my hand better, return to gardening  OBJECTIVE: from eval  HAND DOMINANCE: Right  ADLs: Eating: dependent for cutting food, mod I eating Grooming: mod I  UB Dressing: min assist w/ sports bra and getting shirt overhead LB Dressing: min assist w/ Lt shoe (d/t brace) and tying shoes Toileting: uses BSC - min assist for clothes management Bathing: seated  - w/ min to assist for back, bottom, and feet Tub Shower transfers: grab bar, ? Tub transfer bench for shower (w/ high thresh hold) - close sup to CGA  IADLs: Shopping: daughter performing now Light housekeeping: daughter performing now Meal Prep: pt can heat up things in microwave, make sandwich, however daughter cooking Community mobility: relies on daughter or friend for transportation Medication management: does w/ pillbox and strategies Financial management: mod I  Handwriting:  denies change (difficulty stabilizing check w/ Lt hand)   MOBILITY STATUS:  primarily uses w/c, sup using walker shorter distances   UPPER EXTREMITY ROM:  RUE AROM WNL's.  LUE: Pt has approx 60* sh flexion w/ no control and compensations into abd, IR and forearm pronation. Pt has approx 75% ER, 50% IR. Full elbow flex, 75% elbow ext and supination, wrist WFL's, 90% finger flexion    HAND FUNCTION: Grip strength: Right: 68.1 lbs; Left: 14.5 lbs  COORDINATION: Box & Blocks Lt = 7,  9 hole peg unable LUE Pt able to pick up pen Lt hand but unable to manipulate  SENSATION: Light touch: absent except slight detection more proximal hand - unable to localize. Absent in fingertips Proprioception: Impaired    MUSCLE TONE: LUE: dystonia   COGNITION: Overall cognitive status:  inconsistent w/ comprehension of questions during eval, short term memory deficits  VISION: Subjective report: diplopia resolved Baseline vision: Bifocals Visual history:  none  VISION ASSESSMENT: Not tested  Patient has difficulty with following activities due to following visual impairments: Pt reports some blurriness w/ reading     TODAY'S TREATMENT: Pt had questions about d/c and discussed anticipated d/c on 06/12/22 and reasons for this. Reviewed previously issued HEP's and ways to advance this. Pt also shown BUE AA/ROM in low range and gravity elim shoulder flex holding pool noodle or PVC plastic pipe.   Emphasis also  placed on using Lt hand/UE in functional (safe) tasks including: using as stabilizer to gross assist for bilateral tasks (folding towels), and light low range tasks (opening drawers, retrieving remote, etc) and to assist w/ dressing and bathing.   PATIENT EDUCATION: See above      HOME EXERCISE PROGRAM: 02/12/22: safety considerations d/t lack of sensation Lt hand, functional tasks to perform w/ Lt hand, shoulder HEP, and A/E recommendations (bath mitt, hands free blow dryer holder)    GOALS: Goals reviewed with patient? Yes  SHORT TERM GOALS: Target date: 03/09/22  Independent with initial HEP  Baseline: Goal status: MET  2.  Pt to cut food w/ A/E prn and report greater ease opening peel containers (yogurts, deli meat) Baseline:  Goal status: MET  3.  Pt to improve LUE functional use by demo improvement on Box & Blocks to 10 or more Baseline: 7 blocks Goal status: REVISED (03/11/22, 05/06/22: 8 blocks)   4.  Pt to perform UB dressing mod I level Baseline:  Goal status: MET  5.  Pt to perform low level functional reaching w/ min compensations to grasp/release 1" objects Baseline:  Goal status: MET  6.  Pt to consistently  perform clothes management w/ min assist from LUE w/o LOB Baseline:  Goal status: MET  7.  Pt to verbalize understanding of safety strategies d/t lack of sensation Lt hand Baseline:  Goal status: MET  8.  Pt will be able to hold cup in Lt hand consistently and wash under Rt arm better Baseline:  Goal status: Partially met (for washing under Rt arm)     LONG TERM GOALS: Target date: 05/07/22  Independent w/ updated HEP  Baseline:  Goal status: MET - added updates to coordination, and BUE AA/ROM  2.  Pt to improve LUE function as evidenced by performing Box & Blocks to 13 or more Baseline: 7 blocks Goal status: REVISED  3.  Pt to improve [place] hand as evidenced by performing 9 hole peg test in under 2 min Baseline: unable Goal status:  INITIAL  4.  Pt to be mod I for all ADLS (except sup for shower transfers)  Baseline:  Goal status: MET  5.  Pt to perform 70* sh flexion for functional light reaching with min compensations LUE Baseline:  Goal status: IN PROGRESS (Approx 60*)  6.  Grip strength Lt hand to be 20 lbs or greater Baseline: 14.5 lbs Goal status: IN PROGRESS (18.5 LBS)   7.  Pt will be mod I for gardening task w/ DME/AE prn Baseline:  Goal status: INITIAL   ASSESSMENT:  CLINICAL IMPRESSION: This 10th progress note is for dates 03/11/22 to 05/29/22: Pt has met 7/8 STG's and 2 LTG's at this time. Pt continues to be limited by sensation and ROM LUE. Pt slowly making progress but is beginning to reach plateau and anticipate d/c by end of this month  PERFORMANCE DEFICITS: in functional skills including ADLs, IADLs, coordination, dexterity, proprioception, sensation, tone, ROM, strength, pain, Fine motor control, Gross motor control, mobility, balance, endurance, decreased knowledge of use of DME, and UE functional use, cognitive skills including memory, problem solving, and safety awareness.   IMPAIRMENTS: are limiting patient from ADLs, IADLs, leisure, and social participation.   CO-MORBIDITIES; may have co-morbidities  that affects occupational performance. Patient will benefit from skilled OT to address above impairments and improve overall function.  MODIFICATION OR ASSISTANCE TO COMPLETE EVALUATION: Min-Moderate modification of tasks or assist with assess necessary to complete an evaluation.  OT OCCUPATIONAL PROFILE AND HISTORY: Problem focused assessment: Including review of records relating to presenting problem.  CLINICAL DECISION MAKING: Moderate - several treatment options, min-mod task modification necessary  REHAB POTENTIAL: Good  EVALUATION COMPLEXITY: Low    PLAN:  OT FREQUENCY: 2x/week  OT DURATION: up to 8 additional weeks  PLANNED INTERVENTIONS: self care/ADL training,  therapeutic exercise, therapeutic activity, neuromuscular re-education, passive range of motion, functional mobility training, aquatic therapy, splinting, fluidotherapy, moist heat, patient/family education, cognitive remediation/compensation, visual/perceptual remediation/compensation, coping strategies training, and DME and/or AE instructions  RECOMMENDED OTHER SERVICES: will monitor for speech eval needs if cognition deficits impeding ADLS  CONSULTED AND AGREED WITH PLAN OF CARE: Patient and family member/caregiver  PLAN FOR NEXT SESSION:  Show walker tray, cup holder for walker, one handed can opener, [place] shoe horn or shoe funnel, one handed cutting board, check progress towards goals in prep for d/c 2/28    Redmond Baseman, OTR/L 05/29/22 1:31 PM Phone [phone] FAX [phone]

## 2022-05-29 NOTE — Therapy (Signed)
[place] NO CHARGE     Patient Name: Charlotte Hunter MRN: [id number] DOB:[birth year], 67 y.o., female Today's Date: 05/29/2022   PCP: Su Grand, MD REFERRING PROVIDER: Bayard Hugger, NP       PT End of Session - 05/29/22 1406     Visit Number 39    Number of Visits 72   renewal completed for 16 additional visits   Date for PT Re-Evaluation 05/31/22    Authorization Type Medicare    Authorization Time Period 01-03-22 - 04-03-22;  04-01-22 - 06-13-22    Progress Note Due on Visit 7    PT Start Time 1405    PT Stop Time 1430   arrived no charge   PT Time Calculation (min) 25 min    Equipment Utilized During Treatment Gait belt    Activity Tolerance Treatment limited secondary to medical complications (Comment)   lightheaded, dizzy   Behavior During Therapy [facility] for tasks assessed/performed                                     Past Medical History:  Diagnosis Date   A-fib ([place])    Aortic insufficiency    Hypertension    Stroke [facility])    Past Surgical History:  Procedure Laterality Date   ABDOMINAL HYSTERECTOMY     ABLATION  05/03/2021   heart   c section  01/1968   KNEE SURGERY Right    meniscus   SHOULDER SURGERY Right 08/2014   Patient Active Problem List   Diagnosis Date Noted   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   ICH (intracerebral hemorrhage) ([place]) 10/01/2021   Paroxysmal atrial fibrillation ([place]) 09/29/2021   Stenosis of intracranial vessel 09/29/2021   Essential hypertension 09/29/2021   Blurry vision 09/29/2021   GERD (gastroesophageal reflux disease) 09/29/2021   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 09/25/2021    ONSET DATE: 09-25-21  REFERRING DIAG: I61.9 (ICD-10-CM) - Hemorrhagic stroke ([place])   THERAPY DIAG:  Hemiplegia and 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 left non-dominant side ([name])  Other abnormalities of  gait and mobility  Unsteadiness on feet  Muscle weakness (generalized)  Rationale for Evaluation and Treatment Rehabilitation  SUBJECTIVE:                                                                                                                                                                                              SUBJECTIVE STATEMENT: Pt received handed off from OT session. Pt reports no acute changes since  last session, no falls. Pt reports she is having some pain in her lower back today. Pt does enter clinic from the car today walking with her RW and comes back to therapy gym from lobby via RW.  Pt accompanied by: [name], [name]  PERTINENT HISTORY: 67 year old female here for evaluation of right thalamic intracerebral hemorrhage. Patient is was admitted to the hospital in June 2023 for new onset left-sided weakness headache and nausea. She was found to have intracerebral hemorrhage in the right thalamus. Patient was on Eliquis anticoagulation for atrial fibrillation at the time and this was reversed medically. She was started on hypertonic saline, blood pressure control in ICU admission. Patient went to inpatient rehabilitation. Now patient back home. Still has left-sided weakness and left hand and arm and leg pain. Has been to cardiology who recommended to hold anticoagulation for now as patient is not in atrial fibrillation.   Past medical history of atrial fibrillation on Eliquis aortic insufficiency, hypertension, fibromyalgia  PAIN:  Are you having pain? Yes Location: Lt side of face & chin, edge of Lt arm (lateral side), foot and ankle and Lt buttock  Intensity: 1-2/10  Nerve pain, occasional shock sensation, "rope-like sensation";   04-09-22: not so much in pain but more numbness in Lt side of face - states numbness is moving out    PRECAUTIONS: Fall  WEIGHT BEARING RESTRICTIONS No  FALLS: Has patient fallen in last 6 months? No  LIVING ENVIRONMENT: Lives with:  daughter, [name] Lives in: House/apartment Stairs: No - Ramp has been built at front entrance Has following equipment at home: Programmer, multimedia, Environmental consultant - 2 wheeled, and bed side commode  PLOF: Independent; pt did gardening prior to CVA - was not employed prior to onset of CVA  PATIENT GOALS "be restored back to where I was"   OBJECTIVE:   Vitals:   05/29/22 1439  BP: (!) 144/78  Pulse: 75   Attempted to perform gait assessment with pt this date to initiate goal assessment. Pt ambulates x 10 ft with RW and SBA before onset of lightheadedness/dizziness. Pt returned safely to a sitting position. Pt attempts to stand again to resume therapy session, again feels symptoms. Seated BP assessed as noted above. Pt requesting to terminate session due to symptoms this date. Obtained wheelchair from clinic lobby for patient and wheeled her out to the car. Pt able to perform car transfer from w/c to car with SBA. Pt left in care of her friend for transport home. This session noted as arrived, no charge.    TherEx:  Access Code: [email or web address] - issued on 01-17-22 URL: [email or web address] Date: 01/17/2022 Prepared by: Ethelene Browns  Exercises - Seated Soleus Stretch  - 2-3 x daily - 7 x weekly - 1 sets - 2 reps - 15-20 secs  hold - Slant Board Gastrocnemius Stretch  - 3 x daily - 7 x weekly - 1 sets - 2 reps - 15-20 secs hold - Seated Gastroc Stretch with Strap  - 2-3 x daily - 7 x weekly - 1 sets - 2 reps - 15-20 hold   PATIENT EDUCATION:   Education details: continue to practice gait with RW at home despite ongoing anxiety and fear of falling Person educated: Patient Education method: Explanation, Demonstration Education comprehension: verbalized understanding, demonstration   HOME EXERCISE PROGRAM:  Previously issued Access Code: [email or web address] URL: [email or web address] Date: 01/08/2022 Prepared by: Ethelene Browns  Exercises - Bridge  - 1 x daily - 7 x weekly - 1  sets - 10 reps - 5 hold - Single Leg Bridge  - 1 x daily - 7 x weekly - 1 sets - 10 reps - 3 sec hold - Hooklying Clamshell with Resistance  - 1 x daily - 7 x weekly - 1 sets - 10 reps - 2-3 sec hold - Supine Hip Flexion with Resistance Loop  - 1 x daily - 7 x weekly - 3 sets - 10 reps - Supine Heel Slide  - 1 x daily - 7 x weekly - 3 sets - 10 reps - Stepping out/in with LEFT leg  - 1 x daily - 7 x weekly - 3 sets - 10 reps - Hooklying Hamstring Stretch with Strap  - 1 x daily - 7 x weekly - 1 sets - 1-2 reps - 20 sec hold  GOALS: Goals reviewed with patient? Yes  Goals assessed 04-01-22  Modified independent household amb. With RW.  Baseline: CGA with RW with household ambulation Goal status: Ongoing - not fully met  2.  Pt will increase Berg balance test score to >/= 41/56 to reduce fall risk.  Baseline:  33/56 on 04-01-22 Goal status: NEW  3.  Amb. 350' with RW with SBA for increased community accessibility.  Baseline:  Goal status: Ongoing - 79' with RW with CGA  4.  Negotiate 4 steps with use of handrail with SBA using step by step sequence. Baseline:  Goal status: ONGOING  5.  Pt will improve TUG score by at least 30 secs with RW to demo improved functional mobility and reduced fall risk.  Baseline:  Goal status: ONGOING  6.  Pt will negotiate ramp and curb with RW with SBA for increased community accessibility.  Baseline:  Goal status: ONGOING   NEW SHORT TERM GOALS:  TARGET DATE 05-03-22   Perform basic transfers with supervision from wheelchair to/from mat toward Rt or Lt side. Baseline:  CGA toward Lt side; SBA toward Rt side Goal status:  NEW  2.  Amb. 230' with RW with SBA on flat, even surface to demo improved endurance.  Baseline:  30' with RW with CGA on 04-01-22  Goal status:  Ongoing  3. Pt will amb. From lobby into/out of clinic with RW with CGA.  Baseline:  currently using wheelchair  Goal status:  NEW  4.  Improve Berg score to >/= 37/56 to demo  improved balance.  Baseline:  16/56 on 01-18-22; 31/56 on 02-28-22;  33/56 on 04-01-22  Goal status: NEW  5.  Perform step negotiation with use of Rt hand rail with CGA (4 steps) using step by step sequence.  Baseline:  min assist  Goal status:  Ongoing    6.  Assess TUG with RW. Baseline:  2" with [facility] Goal status:  NEW  7.  Pt will report decreased pain on Lt side of body to </= 4/10 after aquatic therapy. Baseline:  8/10 Goal status:  NEW   NEW LONG TERM GOALS:  TARGET DATE:  05-31-22  Modified independent household amb. With RW.  Baseline: CGA with RW with household ambulation Goal status: Ongoing  2.  Pt will increase Berg balance test score to >/= 41/56 to reduce fall risk.  Baseline:  33/56 on 04-01-22 Goal status: NEW  3.  Amb. 350' with RW with SBA for increased community accessibility.  Baseline:  Goal status: Ongoing - 68' with RW with CGA  4.  Negotiate 4 steps with use of handrail with SBA using step by step sequence.  Baseline:  Goal status: ONGOING  5.  Pt will improve TUG score by at least 30 secs with RW to demo improved functional mobility and reduced fall risk.  Baseline:  Goal status: ONGOING  6.  Pt will negotiate ramp and curb with RW with SBA for increased community accessibility.  Baseline:  Goal status: ONGOING  ASSESSMENT:  CLINICAL IMPRESSION: Arrived no charge due to pt feeling dizzy/lightheaded in standing from fatigue as well as side effects of gabapentin. Pt not safe to continue participation in therapy session at this time.     OBJECTIVE IMPAIRMENTS Abnormal gait, decreased activity tolerance, decreased balance, decreased coordination, decreased endurance, decreased knowledge of use of DME, decreased ROM, decreased strength, impaired sensation, impaired tone, and impaired UE functional use.   ACTIVITY LIMITATIONS carrying, lifting, bending, standing, squatting, stairs, transfers, bed mobility, and locomotion level  PARTICIPATION  LIMITATIONS: meal prep, cleaning, laundry, driving, shopping, community activity, and yard work  [place] and severity of deficits  are also affecting patient's functional outcome.   REHAB POTENTIAL: Good  CLINICAL DECISION MAKING: Evolving/moderate complexity  EVALUATION COMPLEXITY: Moderate  PLAN: PT FREQUENCY: 2x/week for 8 weeks  PT DURATION: 8 weeks  PLANNED INTERVENTIONS: Therapeutic exercises, Therapeutic activity, Neuromuscular re-education, Balance training, Gait training, Patient/Family education, Self Care, Stair training, Orthotic/Fit training, DME instructions, Aquatic Therapy, and Wheelchair mobility training  PLAN FOR NEXT SESSION:   check LTG's and renew - 10th visit progress note due; Cont with gait training with RW; balance exercises/LLE SLS, strengthening exercises; pt needs to schedule more visits during next scheduled clinic visit   Excell Seltzer, PT, DPT, [place] [address]. [place], [place] [address] 05/29/2022, 2:37 PM

## 2022-06-03 ENCOUNTER — Ambulatory Visit: Payer: Medicare Other | Admitting: Occupational Therapy

## 2022-06-03 ENCOUNTER — Ambulatory Visit: Payer: Medicare Other | Admitting: Physical Therapy

## 2022-06-03 ENCOUNTER — Encounter: Payer: Self-pay | Admitting: Occupational Therapy

## 2022-06-03 DIAGNOSIS — R29818 Other symptoms and signs involving the nervous system: Secondary | ICD-10-CM

## 2022-06-03 DIAGNOSIS — I69154 Hemiplegia and 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 left non-dominant side: Secondary | ICD-10-CM | POA: Diagnosis not present

## 2022-06-03 DIAGNOSIS — R2681 Unsteadiness on feet: Secondary | ICD-10-CM

## 2022-06-03 DIAGNOSIS — M6281 Muscle weakness (generalized): Secondary | ICD-10-CM

## 2022-06-03 DIAGNOSIS — R2689 Other abnormalities of gait and mobility: Secondary | ICD-10-CM

## 2022-06-03 DIAGNOSIS — R278 Other lack of coordination: Secondary | ICD-10-CM

## 2022-06-03 DIAGNOSIS — R208 Other disturbances of skin sensation: Secondary | ICD-10-CM

## 2022-06-03 NOTE — Therapy (Signed)
OUTPATIENT OCCUPATIONAL THERAPY NEURO TREATMENT  Patient Name: Charlotte Hunter MRN: [id number] DOB:[birth year], 67 y.o., female Today's Date: 06/03/2022  PCP: Su Grand, MD REFERRING PROVIDER: Bayard Hugger, NP    OT End of Session - 06/03/22 1233     Visit Number 21    Number of Visits 70    Date for OT Re-Evaluation 07/05/22    Authorization Type MCR, BC/BS    Authorization - Visit Number 43    Authorization - Number of Visits 20    Progress Note Due on Visit 62    OT Start Time 1231    OT Stop Time 1315    OT Time Calculation (min) 44 min    Activity Tolerance Patient tolerated treatment well    Behavior During Therapy WFL for tasks assessed/performed                 Past Medical History:  Diagnosis Date   A-fib ([place])    Aortic insufficiency    Hypertension    Stroke [facility])    Past Surgical History:  Procedure Laterality Date   ABDOMINAL HYSTERECTOMY     ABLATION  05/03/2021   heart   c section  01/1968   KNEE SURGERY Right    meniscus   SHOULDER SURGERY Right 08/2014   Patient Active Problem List   Diagnosis Date Noted   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   ICH (intracerebral hemorrhage) ([place]) 10/01/2021   Paroxysmal atrial fibrillation ([place]) 09/29/2021   Stenosis of intracranial vessel 09/29/2021   Essential hypertension 09/29/2021   Blurry vision 09/29/2021   GERD (gastroesophageal reflux disease) 09/29/2021   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 09/25/2021    ONSET DATE: 12/14/2021 Referral date  REFERRING DIAG: I61.9 (ICD-10-CM) - Hemorrhagic stroke ([place])   THERAPY DIAG:  Hemiplegia and 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 left non-dominant side ([name])  Unsteadiness on feet  Muscle weakness (generalized)  Other lack of coordination  Other disturbances of skin sensation  Rationale for Evaluation and Treatment Rehabilitation  SUBJECTIVE:   SUBJECTIVE STATEMENT: I clipped  my nails yesterday Pt accompanied by: self   PERTINENT HISTORY: 67 year old female here for evaluation of right thalamic intracerebral hemorrhage. Patient was admitted to the hospital in September 25, 2021 for new onset left-sided weakness headache and nausea. She was found to have intracerebral hemorrhage in the right thalamus. Patient was on Eliquis anticoagulation for atrial fibrillation at the time and this was reversed medically. She was started on hypertonic saline, blood pressure control in ICU admission. Patient went to inpatient rehabilitation. Then pt had HH therapies. Still has left-sided weakness and left hand and arm and leg pain. Has been to cardiology who recommended to hold anticoagulation for now as patient is not in atrial fibrillation.    Past medical history of atrial fibrillation on Eliquis aortic insufficiency, HTN, fibromyalgia  PRECAUTIONS: Fall and Other: NO lifting > 14 lbs, no driving  WEIGHT BEARING RESTRICTIONS: No  PAIN:  Are you having pain? Yes: NPRS scale: 4-5/10 Pain location: Lt shoulder, Lt torso, hand (ulnar side)  Pain description: burning, constant  Aggravating factors: cold weather Relieving factors: meds, voltaren gel  FALLS: Has patient fallen in last 6 months? No  LIVING ENVIRONMENT: Lives with: daughter, [name] Lives in: House/apartment Stairs: No - Ramp has been built at front entrance Has following equipment at home: w/c, quad cane large base, Walker - 2 wheeled, and bed side commode  PLOF: Independent; pt did gardening prior to CVA -  was not employed prior to onset of CVA   PATIENT GOALS: get my hand better, return to gardening  OBJECTIVE: from eval  HAND DOMINANCE: Right  ADLs: Eating: dependent for cutting food, mod I eating Grooming: mod I  UB Dressing: min assist w/ sports bra and getting shirt overhead LB Dressing: min assist w/ Lt shoe (d/t brace) and tying shoes Toileting: uses BSC - min assist for clothes management Bathing:  seated - w/ min to assist for back, bottom, and feet Tub Shower transfers: grab bar, ? Tub transfer bench for shower (w/ high thresh hold) - close sup to CGA  IADLs: Shopping: daughter performing now Light housekeeping: daughter performing now Meal Prep: pt can heat up things in microwave, make sandwich, however daughter cooking Community mobility: relies on daughter or friend for transportation Medication management: does w/ pillbox and strategies Financial management: mod I  Handwriting:  denies change (difficulty stabilizing check w/ Lt hand)   MOBILITY STATUS:  primarily uses w/c, sup using walker shorter distances   UPPER EXTREMITY ROM:  RUE AROM WNL's.  LUE: Pt has approx 60* sh flexion w/ no control and compensations into abd, IR and forearm pronation. Pt has approx 75% ER, 50% IR. Full elbow flex, 75% elbow ext and supination, wrist WFL's, 90% finger flexion    HAND FUNCTION: Grip strength: Right: 68.1 lbs; Left: 14.5 lbs  COORDINATION: Box & Blocks Lt = 7,  9 hole peg unable LUE Pt able to pick up pen Lt hand but unable to manipulate  SENSATION: Light touch: absent except slight detection more proximal hand - unable to localize. Absent in fingertips Proprioception: Impaired    MUSCLE TONE: LUE: dystonia   COGNITION: Overall cognitive status:  inconsistent w/ comprehension of questions during eval, short term memory deficits  VISION: Subjective report: diplopia resolved Baseline vision: Bifocals Visual history:  none  VISION ASSESSMENT: Not tested  Patient has difficulty with following activities due to following visual impairments: Pt reports some blurriness w/ reading     TODAY'S TREATMENT: Pt shown A/E for increased ease, safety, and independence for ADLS. Pt provided handouts on all A/E including: walker tray, cup holder, LH shoe horn and shoe funnel, one handed cutting board, pot stabilizer, one handed can opener, and nail clipper.   Discussed safety  for bathroom set up and safety with shower transfers - decided to keep tub bench for increased safety  PATIENT EDUCATION: See above      HOME EXERCISE PROGRAM: 02/12/22: safety considerations d/t lack of sensation Lt hand, functional tasks to perform w/ Lt hand, shoulder HEP, and A/E recommendations (bath mitt, hands free blow dryer holder)    GOALS: Goals reviewed with patient? Yes  SHORT TERM GOALS: Target date: 03/09/22  Independent with initial HEP  Baseline: Goal status: MET  2.  Pt to cut food w/ A/E prn and report greater ease opening peel containers (yogurts, deli meat) Baseline:  Goal status: MET  3.  Pt to improve LUE functional use by demo improvement on Box & Blocks to 10 or more Baseline: 7 blocks Goal status: REVISED (03/11/22, 05/06/22: 8 blocks)   4.  Pt to perform UB dressing mod I level Baseline:  Goal status: MET  5.  Pt to perform low level functional reaching w/ min compensations to grasp/release 1" objects Baseline:  Goal status: MET  6.  Pt to consistently perform clothes management w/ min assist from LUE w/o LOB Baseline:  Goal status: MET  7.  Pt to  verbalize understanding of safety strategies d/t lack of sensation Lt hand Baseline:  Goal status: MET  8.  Pt will be able to hold cup in Lt hand consistently and wash under Rt arm better Baseline:  Goal status: Partially met (for washing under Rt arm)     LONG TERM GOALS: Target date: 05/07/22  Independent w/ updated HEP  Baseline:  Goal status: MET - added updates to coordination, and BUE AA/ROM  2.  Pt to improve LUE function as evidenced by performing Box & Blocks to 13 or more Baseline: 7 blocks Goal status: REVISED  3.  Pt to improve [place] hand as evidenced by performing 9 hole peg test in under 2 min Baseline: unable Goal status: INITIAL  4.  Pt to be mod I for all ADLS (except sup for shower transfers)  Baseline:  Goal status: MET  5.  Pt to perform 70* sh flexion for  functional light reaching with min compensations LUE Baseline:  Goal status: IN PROGRESS (Approx 60*)  6.  Grip strength Lt hand to be 20 lbs or greater Baseline: 14.5 lbs Goal status: IN PROGRESS (18.5 LBS)   7.  Pt will be mod I for gardening task w/ DME/AE prn Baseline:  Goal status: IN PROGRESS   ASSESSMENT:  CLINICAL IMPRESSION: Pt with greater understanding of A/E needs for safety with ADLS  PERFORMANCE DEFICITS: in functional skills including ADLs, IADLs, coordination, dexterity, proprioception, sensation, tone, ROM, strength, pain, Fine motor control, Gross motor control, mobility, balance, endurance, decreased knowledge of use of DME, and UE functional use, cognitive skills including memory, problem solving, and safety awareness.   IMPAIRMENTS: are limiting patient from ADLs, IADLs, leisure, and social participation.   CO-MORBIDITIES; may have co-morbidities  that affects occupational performance. Patient will benefit from skilled OT to address above impairments and improve overall function.  MODIFICATION OR ASSISTANCE TO COMPLETE EVALUATION: Min-Moderate modification of tasks or assist with assess necessary to complete an evaluation.  OT OCCUPATIONAL PROFILE AND HISTORY: Problem focused assessment: Including review of records relating to presenting problem.  CLINICAL DECISION MAKING: Moderate - several treatment options, min-mod task modification necessary  REHAB POTENTIAL: Good  EVALUATION COMPLEXITY: Low    PLAN:  OT FREQUENCY: 2x/week  OT DURATION: up to 8 additional weeks  PLANNED INTERVENTIONS: self care/ADL training, therapeutic exercise, therapeutic activity, neuromuscular re-education, passive range of motion, functional mobility training, aquatic therapy, splinting, fluidotherapy, moist heat, patient/family education, cognitive remediation/compensation, visual/perceptual remediation/compensation, coping strategies training, and DME and/or AE  instructions  RECOMMENDED OTHER SERVICES: will monitor for speech eval needs if cognition deficits impeding ADLS  CONSULTED AND AGREED WITH PLAN OF CARE: Patient and family member/caregiver  PLAN FOR NEXT SESSION: work on LUE function, stretches, begin assessing goals in prep for d/c by end of February    [place] Feldpausch [place], OTR/L 06/03/22 12:34 PM Phone [phone] FAX [phone]

## 2022-06-04 ENCOUNTER — Encounter: Payer: Self-pay | Admitting: Physical Therapy

## 2022-06-04 NOTE — Therapy (Signed)
OUTPATIENT PHYSICAL THERAPY NEURO TREATMENT NOTE/10th VISIT PROGRESS NOTE    Progress Note Reporting Period 04-24-22 to 06-03-22  See note below for Objective Data and Assessment of Progress/Goals.       Patient Name: Charlotte Hunter MRN: [id number] DOB:May 11, 1955, 67 y.o., female Today's Date: 06/04/2022   PCP: Su Grand, MD REFERRING PROVIDER: Bayard Hugger, NP     PT End of Session - 06/04/22 [phone]     Visit Number 40    Number of Visits 22   renewal completed for 16 additional visits   Date for PT Re-Evaluation 05/31/22    Authorization Type Medicare    Authorization Time Period 01-03-22 - 04-03-22;  04-01-22 - 06-13-22    Progress Note Due on Visit 73    PT Start Time 1400    PT Stop Time 1455    PT Time Calculation (min) 55 min    Equipment Utilized During Treatment Other (comment)   aquatic step, bar bells   Activity Tolerance Patient tolerated treatment well   lightheaded, dizzy   Behavior During Therapy [facility] for tasks assessed/performed                                    Past Medical History:  Diagnosis Date   A-fib ([place])    Aortic insufficiency    Hypertension    Stroke [facility])    Past Surgical History:  Procedure Laterality Date   ABDOMINAL HYSTERECTOMY     ABLATION  05/03/2021   heart   c section  01/1968   KNEE SURGERY Right    meniscus   SHOULDER SURGERY Right 08/2014   Patient Active Problem List   Diagnosis Date Noted   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   ICH (intracerebral hemorrhage) ([place]) 10/01/2021   Paroxysmal atrial fibrillation ([place]) 09/29/2021   Stenosis of intracranial vessel 09/29/2021   Essential hypertension 09/29/2021   Blurry vision 09/29/2021   GERD (gastroesophageal reflux disease) 09/29/2021   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 09/25/2021    ONSET DATE: 09-25-21  REFERRING DIAG: I61.9 (ICD-10-CM) - Hemorrhagic stroke ([place])   THERAPY DIAG:  Hemiplegia and hemiparesis  following nontraumatic intracerebral hemorrhage affecting left non-dominant side ([name])  Other abnormalities of gait and mobility  Unsteadiness on feet  Other symptoms and signs involving the nervous system  Rationale for Evaluation and Treatment Rehabilitation  SUBJECTIVE:  SUBJECTIVE STATEMENT: Pt reports she was unable to do PT last Wednesday- says her BP was elevated and she had had a "bad week" due to family issues; has been feeling better since the weekend  Pt accompanied by: [name], [name]  PERTINENT HISTORY: 67 year old female here for evaluation of right thalamic intracerebral hemorrhage. Patient is was admitted to the hospital in June 2023 for new onset left-sided weakness headache and nausea. She was found to have intracerebral hemorrhage in the right thalamus. Patient was on Eliquis anticoagulation for atrial fibrillation at the time and this was reversed medically. She was started on hypertonic saline, blood pressure control in ICU admission. Patient went to inpatient rehabilitation. Now patient back home. Still has left-sided weakness and left hand and arm and leg pain. Has been to cardiology who recommended to hold anticoagulation for now as patient is not in atrial fibrillation.   Past medical history of atrial fibrillation on Eliquis aortic insufficiency, hypertension, fibromyalgia  PAIN:  Are you having pain? Yes Location: Lt side of face & chin, edge of Lt arm (lateral side), foot and ankle and Lt buttock  Intensity: 1-2/10  Nerve pain, occasional shock sensation, "rope-like sensation";   04-09-22: not so much in pain but more numbness in Lt side of face - states numbness is moving out    PRECAUTIONS: Fall  WEIGHT BEARING RESTRICTIONS No  FALLS: Has patient fallen in last 6 months?  No  LIVING ENVIRONMENT: Lives with: daughter, [name] Lives in: House/apartment Stairs: No - Ramp has been built at front entrance Has following equipment at home: Programmer, multimedia, Environmental consultant - 2 wheeled, and bed side commode  PLOF: Independent; pt did gardening prior to CVA - was not employed prior to onset of CVA  PATIENT GOALS "be restored back to where I was"   OBJECTIVE:    Aquatic therapy at [facility] - pool temp 92 degrees  Patient seen for aquatic therapy today.  Treatment took place in water 3.5-4.5 feet deep depending upon activity.  Wheelchair was positioned to facing chair lift - pt transferred using squat pivot transfer toward Rt side with SBA.  Air cast was donned on her LLE with water shoes.   Runner's stretch for Lt hamstring/gastroc stretching with min assist - 20 sec hold x 1 rep  Pt performed tap ups 5 reps to 1st step in pool with RLE for LLE closed chain strengthening (knee blocked by PT to prevent hyperextension); 5 reps to 2nd step with RLE for increased weight bearing LLE and closed chain strengthening  Squats bil. LE's 5 reps;  LLE unilateral squats with min to mod HHA 5 reps  Pt performed step negotiation in pool with use of hand rail on Rt side with min assist; descended steps forwards with min to mod assist with RLE descending step first on 1st step; then descended the next 2 steps with LLE first with min assist to ensure positioning in neutral:    Seated position on bench in pool - pt performed hip abduction/adduction with Lt knee flexed at 90 degrees (min assist to maintain this flexed position); bicycled LE's with min assist for Lt knee flexion; pt performed LAQ's LLE 10 reps with min assist to flex Lt knee 90 degrees  Pt performed Lt hamstring strengthening in standing with min assist - buoyancy assisted exercise with knee flexion   Pt gait trained approx. 18' x 4 reps across width of pool with min hand held assist - initially min assist to position Lt  foot in neutral but after only 3-4 steps Lt foot was not supinating as it had in previous aquatic PT sessions  Pt performed tall kneeling position on aquatic bench on pool floor - 10 mini squats with assistance to minimize flotation of LLE in this position; pt transferred tall kneeling to standing with CGA  Pt requires buoyancy of water for support for joint unloading and for reduced fall risk with gait training without device than can be safely performed on land. Water current produces perturbations for challenge for static & dynamic standing balance.  Buoyancy of water provides unweighting which assists with pain reduction.    TherEx:  Access Code: [email or web address] - issued on 01-17-22 URL: [email or web address] Date: 01/17/2022 Prepared by: Ethelene Browns  Exercises - Seated Soleus Stretch  - 2-3 x daily - 7 x weekly - 1 sets - 2 reps - 15-20 secs  hold - Slant Board Gastrocnemius Stretch  - 3 x daily - 7 x weekly - 1 sets - 2 reps - 15-20 secs hold - Seated Gastroc Stretch with Strap  - 2-3 x daily - 7 x weekly - 1 sets - 2 reps - 15-20 hold   PATIENT EDUCATION:   Education details: continue to practice gait with RW at home despite ongoing anxiety and fear of falling Person educated: Patient Education method: Explanation, Demonstration Education comprehension: verbalized understanding, demonstration   HOME EXERCISE PROGRAM:  Previously issued Access Code: [email or web address] URL: [email or web address] Date: 01/08/2022 Prepared by: Ethelene Browns  Exercises - Bridge  - 1 x daily - 7 x weekly - 1 sets - 10 reps - 5 hold - Single Leg Bridge  - 1 x daily - 7 x weekly - 1 sets - 10 reps - 3 sec hold - Hooklying Clamshell with Resistance  - 1 x daily - 7 x weekly - 1 sets - 10 reps - 2-3 sec hold - Supine Hip Flexion with Resistance Loop  - 1 x daily - 7 x weekly - 3 sets - 10 reps - Supine Heel Slide  - 1 x daily - 7 x weekly - 3 sets - 10 reps - Stepping out/in with LEFT leg   - 1 x daily - 7 x weekly - 3 sets - 10 reps - Hooklying Hamstring Stretch with Strap  - 1 x daily - 7 x weekly - 1 sets - 1-2 reps - 20 sec hold  GOALS: Goals reviewed with patient? Yes  Goals assessed 04-01-22  Modified independent household amb. With RW.  Baseline: CGA with RW with household ambulation Goal status: Ongoing - not fully met  2.  Pt will increase Berg balance test score to >/= 41/56 to reduce fall risk.  Baseline:  33/56 on 04-01-22 Goal status: NEW  3.  Amb. 350' with RW with SBA for increased community accessibility.  Baseline:  Goal status: Ongoing - 29' with RW with CGA  4.  Negotiate 4 steps with use of handrail with SBA using step by step sequence. Baseline:  Goal status: ONGOING  5.  Pt will improve TUG score by at least 30 secs with RW to demo improved functional mobility and reduced fall risk.  Baseline:  Goal status: ONGOING  6.  Pt will negotiate ramp and curb with RW with SBA for increased community accessibility.  Baseline:  Goal status: ONGOING   NEW SHORT TERM GOALS:  TARGET DATE 05-03-22   Perform basic transfers with supervision from wheelchair to/from mat toward Rt or Lt side. Baseline:  CGA toward  Lt side; SBA toward Rt side Goal status:  NEW  2.  Amb. 230' with RW with SBA on flat, even surface to demo improved endurance.  Baseline:  44' with RW with CGA on 04-01-22  Goal status:  Ongoing  3. Pt will amb. From lobby into/out of clinic with RW with CGA.  Baseline:  currently using wheelchair  Goal status:  NEW  4.  Improve Berg score to >/= 37/56 to demo improved balance.  Baseline:  16/56 on 01-18-22; 31/56 on 02-28-22;  33/56 on 04-01-22  Goal status: NEW  5.  Perform step negotiation with use of Rt hand rail with CGA (4 steps) using step by step sequence.  Baseline:  min assist  Goal status:  Ongoing    6.  Assess TUG with RW. Baseline:  2" with [facility] Goal status:  NEW  7.  Pt will report decreased pain on Lt side of body  to </= 4/10 after aquatic therapy. Baseline:  8/10 Goal status:  Partially met 06-03-22; pt reports pain intensity fluctuates    NEW LONG TERM GOALS:  TARGET DATE:  05-31-22  Modified independent household amb. With RW.  Baseline: CGA with RW with household ambulation Goal status: Goal met per pt report 06-03-22  2.  Pt will increase Berg balance test score to >/= 41/56 to reduce fall risk.  Baseline:  33/56 on 04-01-22; Berg score 34/56 on 05-08-54 Goal status: NEW  3.  Amb. 350' with RW with SBA for increased community accessibility.  Baseline:  Goal status: Ongoing - 19' with RW with CGA  4.  Negotiate 4 steps with use of handrail with SBA using step by step sequence. Baseline:  Goal status: ONGOING  5.  Pt will improve TUG score by at least 30 secs with RW to demo improved functional mobility and reduced fall risk.  Baseline:  Goal status: ONGOING  6.  Pt will negotiate ramp and curb with RW with SBA for increased community accessibility.  Baseline:  Goal status: ONGOING  ASSESSMENT:  CLINICAL IMPRESSION: This 10th visit progress note covers dates 04-24-22 - 06-03-22.   Pt has met LTG #1 with pt reporting she is now ambulating in her home with RW independently.  Pt continues to use wheelchair for community mobility and also for household mobility at times when increased pain is experienced due to thalamic pain syndrome.  Other LTG's were not assessed due to cancellation/pt not feeling well at previous land appt on 05-29-22.  Pt continues to participate in 1 land and 1 aquatic PT session per week.  Status has fluctuated based on pain intensity.  Aquatic PT session focused on LLE strengthening exercises in open and closed chain position.  Lt hamstring AAROM buoyancy resisted exercises performed in seated position on bench in pool area.  Pt able to flex Lt knee in seated position but continues to need assist to flex Lt knee to 90 degrees.  Pt performed tall kneeling position in 3.6"  water depth for first time in today's session.    Pt reported pain in Lt lateral side upon entering pool but pain subsided after standing approx. 2" statically in water.  Pt is progressing slowly but steadily toward goals.  Continue POC.    OBJECTIVE IMPAIRMENTS Abnormal gait, decreased activity tolerance, decreased balance, decreased coordination, decreased endurance, decreased knowledge of use of DME, decreased ROM, decreased strength, impaired sensation, impaired tone, and impaired UE functional use.   ACTIVITY LIMITATIONS carrying, lifting, bending, standing, squatting, stairs, transfers, bed  mobility, and locomotion level  PARTICIPATION LIMITATIONS: meal prep, cleaning, laundry, driving, shopping, community activity, and yard work  [place] and severity of deficits  are also affecting patient's functional outcome.   REHAB POTENTIAL: Good  CLINICAL DECISION MAKING: Evolving/moderate complexity  EVALUATION COMPLEXITY: Moderate  PLAN: PT FREQUENCY: 2x/week for 8 weeks  PT DURATION: 8 weeks  PLANNED INTERVENTIONS: Therapeutic exercises, Therapeutic activity, Neuromuscular re-education, Balance training, Gait training, Patient/Family education, Self Care, Stair training, Orthotic/Fit training, DME instructions, Aquatic Therapy, and Wheelchair mobility training  PLAN FOR NEXT SESSION:   please check LTG's (I will do renewal): Cont with gait training with RW; balance exercises/LLE SLS, strengthening exercises   Lilyauna Miedema, [place], [place] [address]. [place], [place] [id number] 06/04/2022, 10:56 AM

## 2022-06-05 ENCOUNTER — Ambulatory Visit: Payer: Medicare Other | Admitting: Physical Therapy

## 2022-06-05 DIAGNOSIS — R29818 Other symptoms and signs involving the nervous system: Secondary | ICD-10-CM

## 2022-06-05 DIAGNOSIS — I69154 Hemiplegia and 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 left non-dominant side: Secondary | ICD-10-CM | POA: Diagnosis not present

## 2022-06-05 DIAGNOSIS — R2689 Other abnormalities of gait and mobility: Secondary | ICD-10-CM

## 2022-06-05 DIAGNOSIS — M6281 Muscle weakness (generalized): Secondary | ICD-10-CM

## 2022-06-05 DIAGNOSIS — R2681 Unsteadiness on feet: Secondary | ICD-10-CM

## 2022-06-05 NOTE — Therapy (Signed)
OUTPATIENT PHYSICAL THERAPY NEURO TREATMENT NOTE      Patient Name: Charlotte Hunter MRN: [id number] DOB:05/30/1955, 67 y.o., female Today's Date: 06/05/2022   PCP: Su Grand, MD REFERRING PROVIDER: Bayard Hugger, NP     PT End of Session - 06/05/22 1325     Visit Number 41    Number of Visits 52   renewal completed for 16 additional visits   Date for PT Re-Evaluation 05/31/22    Authorization Type Medicare    Authorization Time Period 01-03-22 - 04-03-22;  04-01-22 - 06-13-22    Progress Note Due on Visit 65    PT Start Time 1320   pt arrived late   PT Stop Time 1415    PT Time Calculation (min) 55 min    Equipment Utilized During Treatment Gait belt    Activity Tolerance Patient tolerated treatment well    Behavior During Therapy WFL for tasks assessed/performed                                     Past Medical History:  Diagnosis Date   A-fib [facility])    Aortic insufficiency    Hypertension    Stroke [facility])    Past Surgical History:  Procedure Laterality Date   ABDOMINAL HYSTERECTOMY     ABLATION  05/03/2021   heart   c section  01/1968   KNEE SURGERY Right    meniscus   SHOULDER SURGERY Right 08/2014   Patient Active Problem List   Diagnosis Date Noted   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   ICH (intracerebral hemorrhage) ([place]) 10/01/2021   Paroxysmal atrial fibrillation ([place]) 09/29/2021   Stenosis of intracranial vessel 09/29/2021   Essential hypertension 09/29/2021   Blurry vision 09/29/2021   GERD (gastroesophageal reflux disease) 09/29/2021   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 09/25/2021    ONSET DATE: 09-25-21  REFERRING DIAG: I61.9 (ICD-10-CM) - Hemorrhagic stroke ([place])   THERAPY DIAG:  Hemiplegia and 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 left non-dominant side ([name])  Other abnormalities of gait and mobility  Unsteadiness on feet  Other symptoms and signs  involving the nervous system  Muscle weakness (generalized)  Rationale for Evaluation and Treatment Rehabilitation  SUBJECTIVE:                                                                                                                                                                                              SUBJECTIVE STATEMENT: Pt excited by all the progress she is making in pool therapy. Pt  excited that she was able to kneel in the pool! Pt is having some soreness in her back today from pool therapy. Pt walked a lot with her cane at home earlier today. Pt also reports that she no longer has pain in her buttocks and is able to tolerate sitting.  Pt accompanied by: [name], [name]  PERTINENT HISTORY: 67 year old female here for evaluation of right thalamic intracerebral hemorrhage. Patient is was admitted to the hospital in June 2023 for new onset left-sided weakness headache and nausea. She was found to have intracerebral hemorrhage in the right thalamus. Patient was on Eliquis anticoagulation for atrial fibrillation at the time and this was reversed medically. She was started on hypertonic saline, blood pressure control in ICU admission. Patient went to inpatient rehabilitation. Now patient back home. Still has left-sided weakness and left hand and arm and leg pain. Has been to cardiology who recommended to hold anticoagulation for now as patient is not in atrial fibrillation.   Past medical history of atrial fibrillation on Eliquis aortic insufficiency, hypertension, fibromyalgia  PAIN:  Are you having pain? Yes Location: Lt side of face & chin, edge of Lt arm (lateral side), foot and ankle and Lt buttock  Intensity: 1-2/10  Nerve pain, occasional shock sensation, "rope-like sensation";   04-09-22: not so much in pain but more numbness in Lt side of face - states numbness is moving out    PRECAUTIONS: Fall  WEIGHT BEARING RESTRICTIONS No  FALLS: Has patient fallen in last 6  months? No  LIVING ENVIRONMENT: Lives with: daughter, [name] Lives in: House/apartment Stairs: No - Ramp has been built at front entrance Has following equipment at home: Programmer, multimedia, Environmental consultant - 2 wheeled, and bed side commode  PLOF: Independent; pt did gardening prior to CVA - was not employed prior to onset of CVA  PATIENT GOALS "be restored back to where I was"   OBJECTIVE:   TherAct:  [facility] PT Assessment - 06/05/22 1345       Standardized Balance Assessment   Standardized Balance Assessment Timed Up and Go Test      Timed Up and Go Test   TUG Normal TUG    Normal TUG (seconds) 59   with RW           Gait:  RAMP:  Level of Assistance: Min A Assistive device utilized: Environmental consultant - 2 wheeled Ramp Comments: assisted with bracing RW when ascending and descending ramp  STAIRS:  Level of Assistance: Min A  Stair Negotiation Technique: Step to Pattern with Bilateral Rails  Number of Stairs: 4   Height of Stairs: 6  Comments: some assist with LLE when descending due to scissoring of limb  GAIT: Gait pattern: decreased hip/knee flexion- Left and genu recurvatum- Left Distance walked: various clinic distances including in/out of appointment from the car and the lobby Assistive device utilized: Walker - 2 wheeled Level of assistance: Modified independence Comments: occasionally takes too large of a step with LLE, needs a standing rest break to regain balance and recover   TherEx:  Access Code: Espy - issued on 01-17-22 URL: [email or web address] Date: 01/17/2022 Prepared by: Ethelene Browns  Exercises - Seated Soleus Stretch  - 2-3 x daily - 7 x weekly - 1 sets - 2 reps - 15-20 secs  hold - Slant Board Gastrocnemius Stretch  - 3 x daily - 7 x weekly - 1 sets - 2 reps - 15-20 secs hold - Seated Gastroc Stretch with Strap  - 2-3  x daily - 7 x weekly - 1 sets - 2 reps - 15-20 hold   PATIENT EDUCATION:   Education details: continue to practice gait  with RW and/or SBQC; bring garden bench to next land therapy appointment Person educated: Patient Education method: Explanation, Demonstration Education comprehension: verbalized understanding, demonstration   HOME EXERCISE PROGRAM:  Previously issued Access Code: [email or web address] URL: [email or web address] Date: 01/08/2022 Prepared by: Ethelene Browns  Exercises - Bridge  - 1 x daily - 7 x weekly - 1 sets - 10 reps - 5 hold - Single Leg Bridge  - 1 x daily - 7 x weekly - 1 sets - 10 reps - 3 sec hold - Hooklying Clamshell with Resistance  - 1 x daily - 7 x weekly - 1 sets - 10 reps - 2-3 sec hold - Supine Hip Flexion with Resistance Loop  - 1 x daily - 7 x weekly - 3 sets - 10 reps - Supine Heel Slide  - 1 x daily - 7 x weekly - 3 sets - 10 reps - Stepping out/in with LEFT leg  - 1 x daily - 7 x weekly - 3 sets - 10 reps - Hooklying Hamstring Stretch with Strap  - 1 x daily - 7 x weekly - 1 sets - 1-2 reps - 20 sec hold  GOALS: Goals reviewed with patient? Yes  Goals assessed 04-01-22  Modified independent household amb. With RW.  Baseline: CGA with RW with household ambulation Goal status: Ongoing - not fully met  2.  Pt will increase Berg balance test score to >/= 41/56 to reduce fall risk.  Baseline:  33/56 on 04-01-22 Goal status: NEW  3.  Amb. 350' with RW with SBA for increased community accessibility.  Baseline:  Goal status: Ongoing - 54' with RW with CGA  4.  Negotiate 4 steps with use of handrail with SBA using step by step sequence. Baseline:  Goal status: ONGOING  5.  Pt will improve TUG score by at least 30 secs with RW to demo improved functional mobility and reduced fall risk.  Baseline:  Goal status: ONGOING  6.  Pt will negotiate ramp and curb with RW with SBA for increased community accessibility.  Baseline:  Goal status: ONGOING   NEW SHORT TERM GOALS:  TARGET DATE 05-03-22   Perform basic transfers with supervision from wheelchair to/from mat  toward Rt or Lt side. Baseline:  CGA toward Lt side; SBA toward Rt side Goal status:  NEW  2.  Amb. 230' with RW with SBA on flat, even surface to demo improved endurance.  Baseline:  27' with RW with CGA on 04-01-22  Goal status:  Ongoing  3. Pt will amb. From lobby into/out of clinic with RW with CGA.  Baseline:  currently using wheelchair  Goal status:  NEW  4.  Improve Berg score to >/= 37/56 to demo improved balance.  Baseline:  16/56 on 01-18-22; 31/56 on 02-28-22;  33/56 on 04-01-22  Goal status: NEW  5.  Perform step negotiation with use of Rt hand rail with CGA (4 steps) using step by step sequence.  Baseline:  min assist  Goal status:  Ongoing    6.  Assess TUG with RW. Baseline:  2" with LBQC Goal status:  NEW  7.  Pt will report decreased pain on Lt side of body to </= 4/10 after aquatic therapy. Baseline:  8/10 Goal status:  Partially met 06-03-22; pt reports pain intensity fluctuates    NEW  LONG TERM GOALS:  TARGET DATE:  05-31-22  Modified independent household amb. With RW.  Baseline: CGA with RW with household ambulation Goal status: Goal met per pt report 06-03-22  2.  Pt will increase Berg balance test score to >/= 41/56 to reduce fall risk.  Baseline:  33/56 on 04-01-22; Berg score 34/56 on 05[birth year] Goal status: IN PROGRESS  3.  Amb. 350' with RW with SBA for increased community accessibility.  Baseline:  Goal status: Ongoing - 73' with RW mod I  4.  Negotiate 4 steps with use of handrail with SBA using step by step sequence. Baseline: min A with handrail x 4 steps (2/21) Goal status: ONGOING  5.  Pt will improve TUG score by at least 30 secs with RW to demo improved functional mobility and reduced fall risk.  Baseline: 2 min; 59 sec with RW mod I (2/21) Goal status: GOAL MET  6.  Pt will negotiate ramp and curb with RW with SBA for increased community accessibility.  Baseline: ramp and curb with RW and min A due to fear of falling (2/21) Goal  status: ONGOING  ASSESSMENT:  CLINICAL IMPRESSION: Emphasis of skilled PT session on assessing LTG. Pt has met 2/6 LTG due to being independent with household ambulation with her RW (and occasionally with her [facility]) and improving her TUG score by 60 seconds, indicating a decreased fall risk. Pt continues to make progress towards improving her Berg score, increasing her gait distance, and being able to navigate stairs/ramp/curb. Pt does remain limited at times by her anxiety leading to decreased independence with functional mobility on stairs and on ramp and curb. She requires extra time and encouragement to complete tasks but has improved in her willingness to try novel tasks and push herself outside of her comfort zone during therapy sessions. Continue POC.   OBJECTIVE IMPAIRMENTS Abnormal gait, decreased activity tolerance, decreased balance, decreased coordination, decreased endurance, decreased knowledge of use of DME, decreased ROM, decreased strength, impaired sensation, impaired tone, and impaired UE functional use.   ACTIVITY LIMITATIONS carrying, lifting, bending, standing, squatting, stairs, transfers, bed mobility, and locomotion level  PARTICIPATION LIMITATIONS: meal prep, cleaning, laundry, driving, shopping, community activity, and yard work  [place] and severity of deficits  are also affecting patient's functional outcome.   REHAB POTENTIAL: Good  CLINICAL DECISION MAKING: Evolving/moderate complexity  EVALUATION COMPLEXITY: Moderate  PLAN: PT FREQUENCY: 2x/week for 8 weeks  PT DURATION: 8 weeks  PLANNED INTERVENTIONS: Therapeutic exercises, Therapeutic activity, Neuromuscular re-education, Balance training, Gait training, Patient/Family education, Self Care, Stair training, Orthotic/Fit training, DME instructions, Aquatic Therapy, and Wheelchair mobility training  PLAN FOR NEXT SESSION:  Nunzio Cobbs will do renewal: Cont with gait training with RW; balance  exercises/LLE SLS, strengthening exercises; look at L AFO (pt feels a "bump" under her arch but may be due to sensation returning)   Excell Seltzer, PT, DPT, [place] [address] [place], [place] [address] 06/05/2022, 3:36 PM

## 2022-06-07 MED ORDER — METHOCARBAMOL 500 MG PO TABS: ORAL_TABLET | ORAL | 1 refills | Status: DC

## 2022-06-07 MED ORDER — GABAPENTIN 400 MG PO CAPS: 400.0000 mg | ORAL_CAPSULE | Freq: Three times a day (TID) | ORAL | 1 refills | Status: DC

## 2022-06-07 NOTE — Addendum Note (Signed)
Addended by: Jasmine December T on: 06/07/2022 02:28 PM   Modules accepted: Orders

## 2022-06-07 NOTE — Telephone Encounter (Signed)
Patient received. Needs refill on Gabapentin and Robaxin

## 2022-06-10 ENCOUNTER — Ambulatory Visit: Payer: Medicare Other | Admitting: Occupational Therapy

## 2022-06-10 ENCOUNTER — Encounter: Payer: Self-pay | Admitting: Occupational Therapy

## 2022-06-10 ENCOUNTER — Encounter: Payer: Self-pay | Admitting: Physical Therapy

## 2022-06-10 ENCOUNTER — Ambulatory Visit: Payer: Medicare Other | Admitting: Physical Therapy

## 2022-06-10 DIAGNOSIS — R29818 Other symptoms and signs involving the nervous system: Secondary | ICD-10-CM

## 2022-06-10 DIAGNOSIS — I69318 Other symptoms and signs involving cognitive functions following cerebral infarction: Secondary | ICD-10-CM

## 2022-06-10 DIAGNOSIS — R2681 Unsteadiness on feet: Secondary | ICD-10-CM

## 2022-06-10 DIAGNOSIS — R278 Other lack of coordination: Secondary | ICD-10-CM

## 2022-06-10 DIAGNOSIS — R2689 Other abnormalities of gait and mobility: Secondary | ICD-10-CM

## 2022-06-10 DIAGNOSIS — I69154 Hemiplegia and 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 left non-dominant side: Secondary | ICD-10-CM | POA: Diagnosis not present

## 2022-06-10 DIAGNOSIS — M6281 Muscle weakness (generalized): Secondary | ICD-10-CM

## 2022-06-10 DIAGNOSIS — R208 Other disturbances of skin sensation: Secondary | ICD-10-CM

## 2022-06-10 NOTE — Therapy (Signed)
OUTPATIENT OCCUPATIONAL THERAPY NEURO TREATMENT  Patient Name: Charlotte Hunter MRN: [id number] DOB:[birth year], 67 y.o., female Today's Date: 06/10/2022  PCP: Charlotte Grand, MD REFERRING PROVIDER: Bayard Hugger, NP    OT End of Session - 06/10/22 1238     Visit Number 22    Number of Visits 25    Date for OT Re-Evaluation 07/05/22    Authorization Type MCR, BC/BS    Authorization - Visit Number 70    Authorization - Number of Visits 20    Progress Note Due on Visit 64    OT Start Time 1233    OT Stop Time 1315    OT Time Calculation (min) 42 min    Activity Tolerance Patient tolerated treatment well    Behavior During Therapy WFL for tasks assessed/performed                 Past Medical History:  Diagnosis Date   A-fib ([place])    Aortic insufficiency    Hypertension    Stroke [facility])    Past Surgical History:  Procedure Laterality Date   ABDOMINAL HYSTERECTOMY     ABLATION  05/03/2021   heart   c section  01/1968   KNEE SURGERY Right    meniscus   SHOULDER SURGERY Right 08/2014   Patient Active Problem List   Diagnosis Date Noted   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   ICH (intracerebral hemorrhage) ([place]) 10/01/2021   Paroxysmal atrial fibrillation ([place]) 09/29/2021   Stenosis of intracranial vessel 09/29/2021   Essential hypertension 09/29/2021   Blurry vision 09/29/2021   GERD (gastroesophageal reflux disease) 09/29/2021   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 09/25/2021    ONSET DATE: 12/14/2021 Referral date  REFERRING DIAG: I61.9 (ICD-10-CM) - Hemorrhagic stroke ([place])   THERAPY DIAG:  Hemiplegia and 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 left non-dominant side ([name])  Unsteadiness on feet  Other symptoms and signs involving the nervous system  Muscle weakness (generalized)  Other lack of coordination  Other disturbances of skin sensation  Other symptoms and signs involving cognitive  functions following cerebral infarction  Rationale for Evaluation and Treatment Rehabilitation  SUBJECTIVE:   SUBJECTIVE STATEMENT: No new falls.  I can now tie drawstring trashbags.  Pt accompanied by: self   PERTINENT HISTORY: 67 year old female here for evaluation of right thalamic intracerebral hemorrhage. Patient was admitted to the hospital in September 25, 2021 for new onset left-sided weakness headache and nausea. She was found to have intracerebral hemorrhage in the right thalamus. Patient was on Eliquis anticoagulation for atrial fibrillation at the time and this was reversed medically. She was started on hypertonic saline, blood pressure control in ICU admission. Patient went to inpatient rehabilitation. Then pt had HH therapies. Still has left-sided weakness and left hand and arm and leg pain. Has been to cardiology who recommended to hold anticoagulation for now as patient is not in atrial fibrillation.    Past medical history of atrial fibrillation on Eliquis aortic insufficiency, HTN, fibromyalgia  PRECAUTIONS: Fall and Other: NO lifting > 14 lbs, no driving  WEIGHT BEARING RESTRICTIONS: No  PAIN:  Are you having pain? Yes: NPRS scale: 4-5/10 Pain location: Lt shoulder, Lt torso, hand (ulnar side)  Pain description: burning, constant  Aggravating factors: cold weather Relieving factors: meds, voltaren gel  FALLS: Has patient fallen in last 6 months? No  LIVING ENVIRONMENT: Lives with: daughter, Charlotte Hunter Lives in: House/apartment Stairs: No - Ramp has been built at front entrance Has following  equipment at home: w/c, quad cane large base, Walker - 2 wheeled, and bed side commode  PLOF: Independent; pt did gardening prior to CVA - was not employed prior to onset of CVA   PATIENT GOALS: get my hand better, return to gardening  OBJECTIVE: from eval  HAND DOMINANCE: Right  ADLs: Eating: dependent for cutting food, mod I eating Grooming: mod I  UB Dressing: min assist w/  sports bra and getting shirt overhead LB Dressing: min assist w/ Lt shoe (d/t brace) and tying shoes Toileting: uses BSC - min assist for clothes management Bathing: seated - w/ min to assist for back, bottom, and feet Tub Shower transfers: grab bar, ? Tub transfer bench for shower (w/ high thresh hold) - close sup to CGA  IADLs: Shopping: daughter performing now Light housekeeping: daughter performing now Meal Prep: pt can heat up things in microwave, make sandwich, however daughter cooking Community mobility: relies on daughter or friend for transportation Medication management: does w/ pillbox and strategies Financial management: mod I  Handwriting:  denies change (difficulty stabilizing check w/ Lt hand)   MOBILITY STATUS:  primarily uses w/c, sup using walker shorter distances   UPPER EXTREMITY ROM:  RUE AROM WNL's.  LUE: Pt has approx 60* sh flexion w/ no control and compensations into abd, IR and forearm pronation. Pt has approx 75% ER, 50% IR. Full elbow flex, 75% elbow ext and supination, wrist WFL's, 90% finger flexion    HAND FUNCTION: Grip strength: Right: 68.1 lbs; Left: 14.5 lbs  COORDINATION: Box & Blocks Lt = 7,  9 hole peg unable LUE Pt able to pick up pen Lt hand but unable to manipulate  SENSATION: Light touch: absent except slight detection more proximal hand - unable to localize. Absent in fingertips Proprioception: Impaired    MUSCLE TONE: LUE: dystonia   COGNITION: Overall cognitive status:  inconsistent w/ comprehension of questions during eval, short term memory deficits  VISION: Subjective report: diplopia resolved Baseline vision: Bifocals Visual history:  none  VISION ASSESSMENT: Not tested  Patient has difficulty with following activities due to following visual impairments: Pt reports some blurriness w/ reading     TODAY'S TREATMENT: Began assessing LTG's for d/c next session - see goal section. Pt is performing the same on Box &  Blocks (no improvements).  Supine: BUE chest press and sh flexion w/ min facilitation/assist prn.   Pt issued Oval 8 for Lt ring finger to prevent PIP hyperextension and reviewed how this is different than Oval 8 for thumb IP joint to prevent flexion  LUE AA/ROM in gravity elim plane using UE Ranger. Attempted against gravity however required mod assist.   PATIENT EDUCATION: See above      HOME EXERCISE PROGRAM: 02/12/22: safety considerations d/t lack of sensation Lt hand, functional tasks to perform w/ Lt hand, shoulder HEP, and A/E recommendations (bath mitt, hands free blow dryer holder)    GOALS: Goals reviewed with patient? Yes  SHORT TERM GOALS: Target date: 03/09/22  Independent with initial HEP  Baseline: Goal status: MET  2.  Pt to cut food w/ A/E prn and report greater ease opening peel containers (yogurts, deli meat) Baseline:  Goal status: MET  3.  Pt to improve LUE functional use by demo improvement on Box & Blocks to 10 or more Baseline: 7 blocks Goal status: NOT MET (03/11/22, 05/06/22, 06/10/22: 8 blocks)   4.  Pt to perform UB dressing mod I level Baseline:  Goal status: MET  5.  Pt to perform low level functional reaching w/ min compensations to grasp/release 1" objects Baseline:  Goal status: MET  6.  Pt to consistently perform clothes management w/ min assist from LUE w/o LOB Baseline:  Goal status: MET  7.  Pt to verbalize understanding of safety strategies d/t lack of sensation Lt hand Baseline:  Goal status: MET  8.  Pt will be able to hold cup in Lt hand consistently and wash under Rt arm better Baseline:  Goal status: Partially met (for washing under Rt arm)     LONG TERM GOALS: Target date: 05/07/22  Independent w/ updated HEP  Baseline:  Goal status: MET - added updates to coordination, and BUE AA/ROM  2.  Pt to improve LUE function as evidenced by performing Box & Blocks to 13 or more Baseline: 7 blocks Goal status: NOT MET  (8 blocks)   3.  Pt to improve [place] hand as evidenced by performing 9 hole peg test in under 2 min Baseline: unable Goal status: INITIAL  4.  Pt to be mod I for all ADLS (except sup for shower transfers)  Baseline:  Goal status: MET  5.  Pt to perform 70* sh flexion for functional light reaching with min compensations LUE Baseline:  Goal status: IN PROGRESS (Approx 60*)  6.  Grip strength Lt hand to be 20 lbs or greater Baseline: 14.5 lbs Goal status: NOT MET (16.9 18.5 LBS)   7.  Pt will be mod I for gardening task w/ DME/AE prn Baseline:  Goal status: NOT MET   ASSESSMENT:  CLINICAL IMPRESSION: Pt ready for d/c next session. Pt has reached plateau in LUE function and sensation  PERFORMANCE DEFICITS: in functional skills including ADLs, IADLs, coordination, dexterity, proprioception, sensation, tone, ROM, strength, pain, Fine motor control, Gross motor control, mobility, balance, endurance, decreased knowledge of use of DME, and UE functional use, cognitive skills including memory, problem solving, and safety awareness.   IMPAIRMENTS: are limiting patient from ADLs, IADLs, leisure, and social participation.   CO-MORBIDITIES; may have co-morbidities  that affects occupational performance. Patient will benefit from skilled OT to address above impairments and improve overall function.  MODIFICATION OR ASSISTANCE TO COMPLETE EVALUATION: Min-Moderate modification of tasks or assist with assess necessary to complete an evaluation.  OT OCCUPATIONAL PROFILE AND HISTORY: Problem focused assessment: Including review of records relating to presenting problem.  CLINICAL DECISION MAKING: Moderate - several treatment options, min-mod task modification necessary  REHAB POTENTIAL: Good  EVALUATION COMPLEXITY: Low    PLAN:  OT FREQUENCY: 2x/week  OT DURATION: up to 8 additional weeks  PLANNED INTERVENTIONS: self care/ADL training, therapeutic exercise, therapeutic activity,  neuromuscular re-education, passive range of motion, functional mobility training, aquatic therapy, splinting, fluidotherapy, moist heat, patient/family education, cognitive remediation/compensation, visual/perceptual remediation/compensation, coping strategies training, and DME and/or AE instructions  RECOMMENDED OTHER SERVICES: will monitor for speech eval needs if cognition deficits impeding ADLS  CONSULTED AND AGREED WITH PLAN OF CARE: Patient and family member/caregiver  PLAN FOR NEXT SESSION: check remaining goals, LUE functional use/reaching, D/C next session    Redmond Baseman, OTR/L 06/10/22 12:39 PM Phone [phone] FAX [phone]

## 2022-06-10 NOTE — Therapy (Signed)
OUTPATIENT PHYSICAL THERAPY NEURO TREATMENT NOTE        Patient Name: [name] MRN: [id number] DOB:1955/10/18, 67 y.o., female Today's Date: 06/10/2022   PCP: Charlotte Grand, MD REFERRING PROVIDER: Bayard Hugger, NP     PT End of Session - 06/10/22 1944     Visit Number 42    Number of Visits 63   renewal completed for 16 additional visits   Date for PT Re-Evaluation 07/26/22    Authorization Type Medicare    Authorization Time Period 01-03-22 - 04-03-22;  04-01-22 - 06-13-22; 06-03-22 - 08-13-22    Progress Note Due on Visit 2    PT Start Time 1405    PT Stop Time 1500    PT Time Calculation (min) 55 min    Equipment Utilized During Treatment Gait belt    Activity Tolerance Patient tolerated treatment well    Behavior During Therapy WFL for tasks assessed/performed                                    Past Medical History:  Diagnosis Date   A-fib [facility])    Aortic insufficiency    Hypertension    Stroke [facility]    Past Surgical History:  Procedure Laterality Date   ABDOMINAL HYSTERECTOMY     ABLATION  05/03/2021   heart   c section  01/1968   KNEE SURGERY Right    meniscus   SHOULDER SURGERY Right 08/2014   Patient Active Problem List   Diagnosis Date Noted   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   ICH (intracerebral hemorrhage) ([place]) 10/01/2021   Paroxysmal atrial fibrillation ([place]) 09/29/2021   Stenosis of intracranial vessel 09/29/2021   Essential hypertension 09/29/2021   Blurry vision 09/29/2021   GERD (gastroesophageal reflux disease) 09/29/2021   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 09/25/2021    ONSET DATE: 09-25-21  REFERRING DIAG: I61.9 (ICD-10-CM) - Hemorrhagic stroke ([place])   THERAPY DIAG:  Hemiplegia and 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 left non-dominant side ([place]) - Plan: PT plan of care cert/re-cert  Other abnormalities of gait and mobility - Plan: PT  plan of care cert/re-cert  Unsteadiness on feet - Plan: PT plan of care cert/re-cert  Other symptoms and signs involving the nervous system - Plan: PT plan of care cert/re-cert  Muscle weakness (generalized) - Plan: PT plan of care cert/re-cert  Rationale for Evaluation and Treatment Rehabilitation  SUBJECTIVE:  SUBJECTIVE STATEMENT: Pt reports she is walking more at home with the walker: continues to do laundry and dishes using the wheelchair so that she is able to sit down when needed  Pt accompanied by: [name], Charlotte Hunter  PERTINENT HISTORY: 67 year old female here for evaluation of right thalamic intracerebral hemorrhage. Patient is was admitted to the hospital in June 2023 for new onset left-sided weakness headache and nausea. She was found to have intracerebral hemorrhage in the right thalamus. Patient was on Eliquis anticoagulation for atrial fibrillation at the time and this was reversed medically. She was started on hypertonic saline, blood pressure control in ICU admission. Patient went to inpatient rehabilitation. Now patient back home. Still has left-sided weakness and left hand and arm and leg pain. Has been to cardiology who recommended to hold anticoagulation for now as patient is not in atrial fibrillation.   Past medical history of atrial fibrillation on Eliquis aortic insufficiency, hypertension, fibromyalgia  PAIN:  Are you having pain? Yes Location: Lt side of face & chin, edge of Lt arm (lateral side), foot and ankle and Lt buttock  Intensity: 1-2/10  Nerve pain, occasional shock sensation, "rope-like sensation";   04-09-22: not so much in pain but more numbness in Lt side of face - states numbness is moving out    PRECAUTIONS: Fall  WEIGHT BEARING RESTRICTIONS No  FALLS: Has  patient fallen in last 6 months? No  LIVING ENVIRONMENT: Lives with: daughter, Charlotte Hunter Lives in: House/apartment Stairs: No - Ramp has been built at front entrance Has following equipment at home: Programmer, multimedia, Environmental consultant - 2 wheeled, and bed side commode  PLOF: Independent; pt did gardening prior to CVA - was not employed prior to onset of CVA  PATIENT GOALS "be restored back to where I was"   OBJECTIVE:    Aquatic therapy at [place] - pool temp 90 degrees  Patient seen for aquatic therapy today.  Treatment took place in water 3.5-4.5 feet deep depending upon activity.  Wheelchair was positioned facing chair lift - pt transferred using stand pivot transfer toward Rt side with supervision.  Air cast was donned on her LLE with water shoes.   Runner's stretch for Lt hamstring/gastroc stretching with min assist - 20 sec hold x 1 rep  Pt performed water walking 18' x 4 reps with use of PT's arm on pt's Rt side for assist with balance;  pt gait trained at end of session 18' x 3 reps with use of bar bells in each hand with min assist for stabilization for LUE - intermittent assist for neutral positioning of Lt foot to reduce supination   Squats bil. LE's 10 reps;  LLE unilateral squats with min to mod HHA 10 reps    Seated position on bench in pool - pt performed hip abduction/adduction with Lt knee flexed at 90 degrees (min assist to maintain this flexed position); bicycled LE's with min assist for Lt knee flexion; pt performed LAQ's LLE 10 reps with min assist to flex Lt knee 90 degrees  Pt performed Lt hip flexor, extensor, and abductor strengthening with use of aquatic cuff for eccentric strengthening 10 reps each direction   Pt gait trained approx. 18' x 4 reps across width of pool with min hand held assist - initially min assist to position Lt foot in neutral but after only 3-4 steps Lt foot was not supinating as it had in previous aquatic PT sessions  Pt performed tall kneeling  position on aquatic bench on  pool floor with min assist to maintain position and to decrease floatation  - 10 mini squats with assistance to minimize flotation of LLE in this position; pt transferred tall kneeling to standing with CGA  Pt requires buoyancy of water for support for joint unloading and for reduced fall risk with gait training without device than can be safely performed on land. Water current produces perturbations for challenge for static & dynamic standing balance.  Buoyancy of water provides unweighting which assists with pain reduction.    TherEx:  Access Code: [email or web address] - issued on 01-17-22 URL: [email or web address] Date: 01/17/2022 Prepared by: Ethelene Browns  Exercises - Seated Soleus Stretch  - 2-3 x daily - 7 x weekly - 1 sets - 2 reps - 15-20 secs  hold - Slant Board Gastrocnemius Stretch  - 3 x daily - 7 x weekly - 1 sets - 2 reps - 15-20 secs hold - Seated Gastroc Stretch with Strap  - 2-3 x daily - 7 x weekly - 1 sets - 2 reps - 15-20 hold   PATIENT EDUCATION:   Education details: continue to practice gait with RW at home despite ongoing anxiety and fear of falling Person educated: Patient Education method: Explanation, Demonstration Education comprehension: verbalized understanding, demonstration   HOME EXERCISE PROGRAM:  Previously issued Access Code: [email or web address] URL: [email or web address] Date: 01/08/2022 Prepared by: Ethelene Browns  Exercises - Bridge  - 1 x daily - 7 x weekly - 1 sets - 10 reps - 5 hold - Single Leg Bridge  - 1 x daily - 7 x weekly - 1 sets - 10 reps - 3 sec hold - Hooklying Clamshell with Resistance  - 1 x daily - 7 x weekly - 1 sets - 10 reps - 2-3 sec hold - Supine Hip Flexion with Resistance Loop  - 1 x daily - 7 x weekly - 3 sets - 10 reps - Supine Heel Slide  - 1 x daily - 7 x weekly - 3 sets - 10 reps - Stepping out/in with LEFT leg  - 1 x daily - 7 x weekly - 3 sets - 10 reps - Hooklying Hamstring Stretch  with Strap  - 1 x daily - 7 x weekly - 1 sets - 1-2 reps - 20 sec hold  GOALS: Goals reviewed with patient? Yes      NEW SHORT TERM GOALS:  TARGET DATE 06-28-22   Perform a squat pivot transfer toward Rt side with CGA. Baseline: pt performing stand pivot transfers Goal status:  NEW  2.  Amb. 250' with RW with SBA on flat, even surface to demo improved endurance.  Baseline:  81' with RW   Goal status:  NEW  3. Pt will amb. From lobby into/out of clinic consistently with RW with SBA.  Baseline:  ambulating inconsistently with RW in/out of clinic depending on fatigue/pain, etc.   Goal status:  NEW  4.  Improve Berg score to >/= 38/56 to demo improved balance.  Baseline:  16/56 on 01-18-22; 31/56 on 02-28-22;  33/56 on 04-01-22  Goal status: ONGOING  5.  Perform step negotiation with use of Rt hand rail with SBA (4 steps) using step by step sequence.  Baseline:  min assist  Goal status:  Ongoing/REVISED    6.  Improve TUG score to </= 48 secs with RW to demo improved functional mobility. Baseline:  59 sec with RW mod I (2/21) Goal status:  NEW  7.  Pt will transfer floor to stand with  UE support on object with min assist.  Baseline:  to be assessed  Goal status:  NEW    NEW LONG TERM GOALS:  TARGET DATE:  07-26-22  Pt will report ambulating up/down ramp at her home modified independently with RW in order to access/exit home alone.  Baseline: pt reports she has walked up the ramp with SBA from daughter but has not walked down the ramp as of this time due to fear  Goal status:  NEW  2.  Pt will increase Berg balance test score to >/= 41/56 to reduce fall risk.  Baseline:  33/56 on 04-01-22; Berg score 34/56 on 05-08-54 Goal status: IN PROGRESS/ONGOING  3.  Amb. 350' with RW with SBA for increased community accessibility.  Baseline:  Goal status: UPDATED 06-10-22  4.  Negotiate 4 steps with use of handrail with supervision using step by step sequence. Baseline: min A with  handrail x 4 steps (2/21) Goal status: UPDATED  5.  Pt will improve TUG score to </= 43 secs with RW to demo improved functional mobility and reduced fall risk.  Baseline: 2 min with SBQC:  59 sec with RW mod I (2/21) Goal status: GOAL MET  6.  Pt will negotiate ramp and curb with RW with SBA for increased community accessibility.  Baseline: ramp and curb with RW and min A due to fear of falling (2/21) Goal status: ONGOING    7.  Pt will transfer from garden bench to standing with SBA with use of RW to enable pt to return to leisure activity of planting flowers.   Baseline:  To be assessed   Goal status:  NEW  ASSESSMENT:  CLINICAL IMPRESSION: Aquatic PT session focused on LLE strengthening exercises and gait training with use of PT's arm at beginning of session, progressing to use of bar bells for increased challenge with balance during gait.  Pt had more difficulty performing tall kneeling position and maintaining balance in this position in today's session than in previous session last week.  Pt is progressing slowly but steadily toward goals.  Continue POC.    OBJECTIVE IMPAIRMENTS Abnormal gait, decreased activity tolerance, decreased balance, decreased coordination, decreased endurance, decreased knowledge of use of DME, decreased ROM, decreased strength, impaired sensation, impaired tone, and impaired UE functional use.   ACTIVITY LIMITATIONS carrying, lifting, bending, standing, squatting, stairs, transfers, bed mobility, and locomotion level  PARTICIPATION LIMITATIONS: meal prep, cleaning, laundry, driving, shopping, community activity, and yard work  [place] and severity of deficits  are also affecting patient's functional outcome.   REHAB POTENTIAL: Good  CLINICAL DECISION MAKING: Evolving/moderate complexity  EVALUATION COMPLEXITY: Moderate  PLAN: PT FREQUENCY: 2x/week for 8 weeks  PT DURATION: 8 weeks  PLANNED INTERVENTIONS: Therapeutic  exercises, Therapeutic activity, Neuromuscular re-education, Balance training, Gait training, Patient/Family education, Self Care, Stair training, Orthotic/Fit training, DME instructions, Aquatic Therapy, and Wheelchair mobility training  PLAN FOR NEXT SESSION:   Squat pivot transfer training toward Rt side:  tall kneeling on mat? Cont with gait training with RW; balance exercises/LLE SLS, strengthening exercises   Rickelle Sylvestre, [place], [place] [address] [place], [place] [address] 06/10/2022, 8:57 PM

## 2022-06-12 ENCOUNTER — Ambulatory Visit: Payer: Medicare Other | Admitting: Occupational Therapy

## 2022-06-12 ENCOUNTER — Ambulatory Visit: Payer: Medicare Other | Admitting: Physical Therapy

## 2022-06-12 DIAGNOSIS — I69154 Hemiplegia and 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 left non-dominant side: Secondary | ICD-10-CM

## 2022-06-12 DIAGNOSIS — R208 Other disturbances of skin sensation: Secondary | ICD-10-CM

## 2022-06-12 DIAGNOSIS — R2681 Unsteadiness on feet: Secondary | ICD-10-CM

## 2022-06-12 DIAGNOSIS — R2689 Other abnormalities of gait and mobility: Secondary | ICD-10-CM

## 2022-06-12 DIAGNOSIS — R29818 Other symptoms and signs involving the nervous system: Secondary | ICD-10-CM

## 2022-06-12 DIAGNOSIS — M6281 Muscle weakness (generalized): Secondary | ICD-10-CM

## 2022-06-12 DIAGNOSIS — R278 Other lack of coordination: Secondary | ICD-10-CM

## 2022-06-12 NOTE — Therapy (Signed)
OUTPATIENT OCCUPATIONAL THERAPY NEURO TREATMENT  Patient Name: Charlotte Hunter MRN: [id number] DOB:April 13, 1956, 67 y.o., female Today's Date: 06/12/2022  PCP: Su Grand, MD REFERRING PROVIDER: Bayard Hugger, NP    OT End of Session - 06/12/22 1252     Visit Number 23    Number of Visits 29    Date for OT Re-Evaluation 07/05/22    Authorization Type MCR, BC/BS    Authorization - Visit Number 23    Authorization - Number of Visits 20    Progress Note Due on Visit 78    OT Start Time 1248   pt arrived late   OT Stop Time 1315    OT Time Calculation (min) 27 min    Activity Tolerance Patient tolerated treatment well    Behavior During Therapy WFL for tasks assessed/performed                 Past Medical History:  Diagnosis Date   A-fib ([place])    Aortic insufficiency    Hypertension    Stroke [facility])    Past Surgical History:  Procedure Laterality Date   ABDOMINAL HYSTERECTOMY     ABLATION  05/03/2021   heart   c section  01/1968   KNEE SURGERY Right    meniscus   SHOULDER SURGERY Right 08/2014   Patient Active Problem List   Diagnosis Date Noted   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   ICH (intracerebral hemorrhage) ([place]) 10/01/2021   Paroxysmal atrial fibrillation ([place]) 09/29/2021   Stenosis of intracranial vessel 09/29/2021   Essential hypertension 09/29/2021   Blurry vision 09/29/2021   GERD (gastroesophageal reflux disease) 09/29/2021   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  I can now tie drawstring trashbags.  Pt accompanied by: self   PERTINENT HISTORY: 67 year old female here for evaluation of right thalamic intracerebral hemorrhage. Patient was admitted to the hospital in September 25, 2021 for new onset left-sided weakness headache and nausea. She was found to have intracerebral hemorrhage in the right thalamus. Patient was on Eliquis anticoagulation for atrial fibrillation at the time and this was reversed medically. She was started on hypertonic saline, blood pressure control in ICU admission. Patient went to inpatient rehabilitation. Then pt had HH therapies. Still has left-sided weakness and left hand and arm and leg pain. Has been to cardiology who recommended to hold anticoagulation for now as patient is not in atrial fibrillation.    Past medical history of atrial fibrillation on Eliquis aortic insufficiency, HTN, fibromyalgia  PRECAUTIONS: Fall and Other: NO lifting > 14 lbs, no driving  WEIGHT BEARING RESTRICTIONS: No  PAIN:  Are you having pain? Yes: NPRS scale: 3-4/10 Pain location: Lt shoulder, Lt torso, hand (ulnar side)  Pain description: burning, constant  Aggravating factors: cold weather Relieving factors: meds, voltaren gel  FALLS: Has patient fallen in last 6 months? No  LIVING ENVIRONMENT: Lives with: daughter, [name] Lives in: House/apartment Stairs: No - Ramp has been built at front entrance Has following equipment at home: w/c, quad cane large base, Walker - 2  wheeled, and bed side commode  PLOF: Independent; pt did gardening prior to CVA - was not employed prior to onset of CVA   PATIENT GOALS: get my hand better, return to gardening  OBJECTIVE: from eval  HAND DOMINANCE: Right  ADLs: Eating: dependent for cutting food, mod I eating Grooming: mod I  UB Dressing: min assist w/ sports bra and getting shirt overhead LB Dressing: min assist w/ Lt shoe (d/t  brace) and tying shoes Toileting: uses BSC - min assist for clothes management Bathing: seated - w/ min to assist for back, bottom, and feet Tub Shower transfers: grab bar, ? Tub transfer bench for shower (w/ high thresh hold) - close sup to CGA  IADLs: Shopping: daughter performing now Light housekeeping: daughter performing now Meal Prep: pt can heat up things in microwave, make sandwich, however daughter cooking Community mobility: relies on daughter or friend for transportation Medication management: does w/ pillbox and strategies Financial management: mod I  Handwriting:  denies change (difficulty stabilizing check w/ Lt hand)   MOBILITY STATUS:  primarily uses w/c, sup using walker shorter distances   UPPER EXTREMITY ROM:  RUE AROM WNL's.  LUE: Pt has approx 60* sh flexion w/ no control and compensations into abd, IR and forearm pronation. Pt has approx 75% ER, 50% IR. Full elbow flex, 75% elbow ext and supination, wrist WFL's, 90% finger flexion    HAND FUNCTION: Grip strength: Right: 68.1 lbs; Left: 14.5 lbs  COORDINATION: Box & Blocks Lt = 7,  9 hole peg unable LUE Pt able to pick up pen Lt hand but unable to manipulate  SENSATION: Light touch: absent except slight detection more proximal hand - unable to localize. Absent in fingertips Proprioception: Impaired    MUSCLE TONE: LUE: dystonia   COGNITION: Overall cognitive status:  inconsistent w/ comprehension of questions during eval, short term memory deficits  VISION: Subjective report: diplopia resolved Baseline vision: Bifocals Visual history:  none  VISION ASSESSMENT: Not tested  Patient has difficulty with following activities due to following visual impairments: Pt reports some blurriness w/ reading     TODAY'S TREATMENT: Assessed remaining goals - pt has had no changes in Box & Blocks score since end of Nov 2023 and grip strength has not improved w/ recent testing. Pt reports increased use  functionally LUE.   Pt asked for another month of O.T. however therapist explained rationale for d/c and lack of progress  Pt performed functional reaching activity LUE with assist from Rt hand required. Pt placing large pegs in pegboard Lt hand w/ max difficulty, drops, and assist from Rt hand to support distal forearm.   PATIENT EDUCATION: See above    HOME EXERCISE PROGRAM: 02/12/22: safety considerations d/t lack of sensation Lt hand, functional tasks to perform w/ Lt hand, shoulder HEP, and A/E recommendations (bath mitt, hands free blow dryer holder)    GOALS: Goals reviewed with patient? Yes  SHORT TERM GOALS: Target date: 03/09/22  Independent with initial HEP  Baseline: Goal status: MET  2.  Pt to cut food w/ A/E prn and report greater ease opening peel containers (yogurts, deli meat) Baseline:  Goal status: MET  3.  Pt to improve LUE functional use by demo improvement on Box & Blocks to 10 or more Baseline: 7 blocks Goal status: NOT MET (03/11/22, 05/06/22, 06/10/22: 8 blocks)   4.  Pt to perform UB dressing mod I level Baseline:  Goal status: MET  5.  Pt to perform low level  functional reaching w/ min compensations to grasp/release 1" objects Baseline:  Goal status: MET  6.  Pt to consistently perform clothes management w/ min assist from LUE w/o LOB Baseline:  Goal status: MET  7.  Pt to verbalize understanding of safety strategies d/t lack of sensation Lt hand Baseline:  Goal status: MET  8.  Pt will be able to hold cup in Lt hand consistently and wash under Rt arm better Baseline:  Goal status: Partially met (for washing under Rt arm)     LONG TERM GOALS: Target date: 05/07/22  Independent w/ updated HEP  Baseline:  Goal status: MET - added updates to coordination, and BUE AA/ROM  2.  Pt to improve LUE function as evidenced by performing Box & Blocks to 13 or more Baseline: 7 blocks Goal status: NOT MET (8 blocks)   3.  Pt to improve [place]  hand as evidenced by performing 9 hole peg test in under 2 min Baseline: unable Goal status: NOT MET unable  4.  Pt to be mod I for all ADLS (except sup for shower transfers)  Baseline:  Goal status: MET  5.  Pt to perform 70* sh flexion for functional light reaching with min compensations LUE Baseline:  Goal status: MET w/ decreased control and drops  6.  Grip strength Lt hand to be 20 lbs or greater Baseline: 14.5 lbs Goal status: NOT MET (16.9 18.5 LBS, 06/12/22: 12 lbs)    7.  Pt will be mod I for gardening task w/ DME/AE prn Baseline:  Goal status: NOT MET   ASSESSMENT:  CLINICAL IMPRESSION: Pt has reached 6.5/8 STG's, and 3/7 LTG's.  Pt limited by decreased controlled movements LUE and still requires support from other hand for functional tasks.   PERFORMANCE DEFICITS: in functional skills including ADLs, IADLs, coordination, dexterity, proprioception, sensation, tone, ROM, strength, pain, Fine motor control, Gross motor control, mobility, balance, endurance, decreased knowledge of use of DME, and UE functional use, cognitive skills including memory, problem solving, and safety awareness.   IMPAIRMENTS: are limiting patient from ADLs, IADLs, leisure, and social participation.   CO-MORBIDITIES; may have co-morbidities  that affects occupational performance. Patient will benefit from skilled OT to address above impairments and improve overall function.  MODIFICATION OR ASSISTANCE TO COMPLETE EVALUATION: Min-Moderate modification of tasks or assist with assess necessary to complete an evaluation.  OT OCCUPATIONAL PROFILE AND HISTORY: Problem focused assessment: Including review of records relating to presenting problem.  CLINICAL DECISION MAKING: Moderate - several treatment options, min-mod task modification necessary  REHAB POTENTIAL: Good  EVALUATION COMPLEXITY: Low    PLAN:  OT FREQUENCY: 2x/week  OT DURATION: up to 8 additional weeks  PLANNED INTERVENTIONS:  self care/ADL training, therapeutic exercise, therapeutic activity, neuromuscular re-education, passive range of motion, functional mobility training, aquatic therapy, splinting, fluidotherapy, moist heat, patient/family education, cognitive remediation/compensation, visual/perceptual remediation/compensation, coping strategies training, and DME and/or AE instructions  RECOMMENDED OTHER SERVICES: will monitor for speech eval needs if cognition deficits impeding ADLS  CONSULTED AND AGREED WITH PLAN OF CARE: Patient and family member/caregiver  PLAN  D/C O.T.   OCCUPATIONAL THERAPY DISCHARGE SUMMARY  Visits from Start of Care: 23  Current functional level related to goals / functional outcomes: SEE ABOVE   Remaining deficits: Lt Hemiplegia Decreased sensation Lt hand/Lt side of body Coordination Balance   Education / Equipment: HEP's, safety, A/E recommendations   Patient agrees to discharge. Patient goals were partially met. Patient is being discharged due to lack  of progress.and reached maximal rehab potential at this time. Pt is concerned about regressing, however therapist explained she must practice at home and that depends on her.     Redmond Baseman, OTR/L 06/12/22 3:14 PM Phone [phone] FAX [phone]

## 2022-06-12 NOTE — Therapy (Signed)
OUTPATIENT PHYSICAL THERAPY NEURO TREATMENT NOTE        Patient Name: Charlotte Hunter MRN: [id number] DOB:22-Feb-1956, 67 y.o., female Today's Date: 06/12/2022   PCP: Su Grand, MD REFERRING PROVIDER: Bayard Hugger, NP     PT End of Session - 06/12/22 1323     Visit Number 43    Number of Visits 40   renewal completed for 16 additional visits   Date for PT Re-Evaluation 07/26/22    Authorization Type Medicare    Authorization Time Period 01-03-22 - 04-03-22;  04-01-22 - 06-13-22; 06-03-22 - 08-13-22    Progress Note Due on Visit 58    PT Start Time 1322   from OT session   PT Stop Time 1408    PT Time Calculation (min) 46 min    Equipment Utilized During Treatment Gait belt    Activity Tolerance Patient tolerated treatment well    Behavior During Therapy WFL for tasks assessed/performed                                     Past Medical History:  Diagnosis Date   A-fib [facility])    Aortic insufficiency    Hypertension    Stroke [facility])    Past Surgical History:  Procedure Laterality Date   ABDOMINAL HYSTERECTOMY     ABLATION  05/03/2021   heart   c section  01/1968   KNEE SURGERY Right    meniscus   SHOULDER SURGERY Right 08/2014   Patient Active Problem List   Diagnosis Date Noted   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   ICH (intracerebral hemorrhage) ([place]) 10/01/2021   Paroxysmal atrial fibrillation ([place]) 09/29/2021   Stenosis of intracranial vessel 09/29/2021   Essential hypertension 09/29/2021   Blurry vision 09/29/2021   GERD (gastroesophageal reflux disease) 09/29/2021   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 09/25/2021    ONSET DATE: 09-25-21  REFERRING DIAG: I61.9 (ICD-10-CM) - Hemorrhagic stroke ([place])   THERAPY DIAG:  Hemiplegia and 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 left non-dominant side ([name])  Unsteadiness on feet  Muscle weakness (generalized)  Other  abnormalities of gait and mobility  Rationale for Evaluation and Treatment Rehabilitation  SUBJECTIVE:                                                                                                                                                                                              SUBJECTIVE STATEMENT: Pt reports she is doing good. Pt brings in her garden cart that she is wanting to  work towards transferring on/off of (13" tall, with wheels).  Pt accompanied by: Gillermo Murdoch  PERTINENT HISTORY: 67 year old female here for evaluation of right thalamic intracerebral hemorrhage. Patient is was admitted to the hospital in June 2023 for new onset left-sided weakness headache and nausea. She was found to have intracerebral hemorrhage in the right thalamus. Patient was on Eliquis anticoagulation for atrial fibrillation at the time and this was reversed medically. She was started on hypertonic saline, blood pressure control in ICU admission. Patient went to inpatient rehabilitation. Now patient back home. Still has left-sided weakness and left hand and arm and leg pain. Has been to cardiology who recommended to hold anticoagulation for now as patient is not in atrial fibrillation.   Past medical history of atrial fibrillation on Eliquis aortic insufficiency, hypertension, fibromyalgia  PAIN:  Are you having pain? Yes Location: Lt side of face & chin, edge of Lt arm (lateral side), foot and ankle and Lt buttock  Intensity: 1-2/10  Nerve pain, occasional shock sensation, "rope-like sensation";   04-09-22: not so much in pain but more numbness in Lt side of face - states numbness is moving out    PRECAUTIONS: Fall  WEIGHT BEARING RESTRICTIONS No  FALLS: Has patient fallen in last 6 months? No  LIVING ENVIRONMENT: Lives with: daughter, [name] Lives in: House/apartment Stairs: No - Ramp has been built at front entrance Has following equipment at home: Programmer, multimedia, Environmental consultant - 2  wheeled, and bed side commode  PLOF: Independent; pt did gardening prior to CVA - was not employed prior to onset of CVA  PATIENT GOALS "be restored back to where I was"   OBJECTIVE:    TherAct: Pt ambulates in/out of therapy session from lobby of clinic with her RW. Pt received handed off from OT session. Pt brings in her gardening cart she is wanting to transfer on/off of at home for gardening this spring and summer. Cart is 13" tall and has 4 wheels on it.  Initiated simulation of transfer to lower surfaces to determine pt's current functional level with working towards this goal: Stand to/from sit on 15.5" total height step with blue cushion x 3 reps Initially with use of RW: Difficulty with eccentric control when sitting, needs max A to stand from low surface with BUE support on RW Transitioned to use of RUE pushing up from mat table and LUE on RW Improved eccentric control when sitting though still with ongoing difficulty with centering hips on step to sit safely Only needs min A to stand with RUE support on mat table Stand to/from sit on 18" total height step with blue cushion x 5 reps CGA to perform transfer with improved eccentric control when sitting  Pt asking for her L AFO to be inspected, appears to have extra room on medial side of her ankle and LE due to decreased edema. Pt also with some discomfort along plantar aspect of her foot, may be due to return of some sensation in this region. Encouraged pt to reach out to [facility] to see if her AFO can be adjusted. Provided contact information for [employer].   PATIENT EDUCATION:   Education details: continue to practice gait with RW and or SBQC at home, continue HEP Person educated: Patient Education method: Consulting civil engineer, Demonstration Education comprehension: verbalized understanding, demonstration   HOME EXERCISE PROGRAM:  Previously issued Access Code: [email or web address] URL: [email or web address] Date:  01/08/2022 Prepared by: Ethelene Browns  Exercises - Bridge  - 1 x daily - 7  x weekly - 1 sets - 10 reps - 5 hold - Single Leg Bridge  - 1 x daily - 7 x weekly - 1 sets - 10 reps - 3 sec hold - Hooklying Clamshell with Resistance  - 1 x daily - 7 x weekly - 1 sets - 10 reps - 2-3 sec hold - Supine Hip Flexion with Resistance Loop  - 1 x daily - 7 x weekly - 3 sets - 10 reps - Supine Heel Slide  - 1 x daily - 7 x weekly - 3 sets - 10 reps - Stepping out/in with LEFT leg  - 1 x daily - 7 x weekly - 3 sets - 10 reps - Hooklying Hamstring Stretch with Strap  - 1 x daily - 7 x weekly - 1 sets - 1-2 reps - 20 sec hold   TherEx:  Access Code: [email or web address] - issued on 01-17-22 URL: [email or web address] Date: 01/17/2022 Prepared by: Ethelene Browns  Exercises - Seated Soleus Stretch  - 2-3 x daily - 7 x weekly - 1 sets - 2 reps - 15-20 secs  hold - Slant Board Gastrocnemius Stretch  - 3 x daily - 7 x weekly - 1 sets - 2 reps - 15-20 secs hold - Seated Gastroc Stretch with Strap  - 2-3 x daily - 7 x weekly - 1 sets - 2 reps - 15-20 hold  GOALS: Goals reviewed with patient? Yes      NEW SHORT TERM GOALS:  TARGET DATE 06-28-22   Perform a squat pivot transfer toward Rt side with CGA. Baseline: pt performing stand pivot transfers Goal status:  NEW  2.  Amb. 250' with RW with SBA on flat, even surface to demo improved endurance.  Baseline:  32' with RW   Goal status:  NEW  3. Pt will amb. From lobby into/out of clinic consistently with RW with SBA.  Baseline:  ambulating inconsistently with RW in/out of clinic depending on fatigue/pain, etc.   Goal status:  NEW  4.  Improve Berg score to >/= 38/56 to demo improved balance.  Baseline:  16/56 on 01-18-22; 31/56 on 02-28-22;  33/56 on 04-01-22  Goal status: ONGOING  5.  Perform step negotiation with use of Rt hand rail with SBA (4 steps) using step by step sequence.  Baseline:  min assist  Goal status:  Ongoing/REVISED    6.   Improve TUG score to </= 48 secs with RW to demo improved functional mobility. Baseline:  59 sec with RW mod I (2/21) Goal status:  NEW  7.  Pt will transfer floor to stand with UE support on object with min assist.  Baseline:  to be assessed  Goal status:  NEW    NEW LONG TERM GOALS:  TARGET DATE:  07-26-22  Pt will report ambulating up/down ramp at her home modified independently with RW in order to access/exit home alone.  Baseline: pt reports she has walked up the ramp with SBA from daughter but has not walked down the ramp as of this time due to fear  Goal status:  NEW  2.  Pt will increase Berg balance test score to >/= 41/56 to reduce fall risk.  Baseline:  33/56 on 04-01-22; Berg score 34/56 on 05-08-54 Goal status: IN PROGRESS/ONGOING  3.  Amb. 350' with RW with SBA for increased community accessibility.  Baseline:  Goal status: UPDATED 06-10-22  4.  Negotiate 4 steps with use of handrail with supervision using step by step sequence.  Baseline: min A with handrail x 4 steps (2/21) Goal status: UPDATED  5.  Pt will improve TUG score to </= 43 secs with RW to demo improved functional mobility and reduced fall risk.  Baseline: 2 min with SBQC:  59 sec with RW mod I (2/21) Goal status: GOAL MET  6.  Pt will negotiate ramp and curb with RW with SBA for increased community accessibility.  Baseline: ramp and curb with RW and min A due to fear of falling (2/21) Goal status: ONGOING   7.  Pt will transfer from garden bench to standing with SBA with use of RW to enable pt to return to leisure activity of planting flowers.  Baseline:  To be assessed  Goal status:  NEW  ASSESSMENT:  CLINICAL IMPRESSION: Emphasis of skilled PT session on working on sit to stands from lower surfaces to work towards pt being able to stand from her garden cart. Pt currently requires up to max A to stand from 15.5" surface and CGA to stand from 18" surface with RUE supporting pushing from mat table and  assist stabilizing her RW. Pt would need continued practice to be safe enough to stand from a 13" surface but likely would not be safe to stand from a surface that has wheels that don't lock. Will continue to problem solve with patient safest device to use to access her gardening area. Pt continues to benefit from skilled therapy services to address ongoing L hemibody weakness leading to decreased safety and independence with functional mobility. Continue POC.   OBJECTIVE IMPAIRMENTS Abnormal gait, decreased activity tolerance, decreased balance, decreased coordination, decreased endurance, decreased knowledge of use of DME, decreased ROM, decreased strength, impaired sensation, impaired tone, and impaired UE functional use.   ACTIVITY LIMITATIONS carrying, lifting, bending, standing, squatting, stairs, transfers, bed mobility, and locomotion level  PARTICIPATION LIMITATIONS: meal prep, cleaning, laundry, driving, shopping, community activity, and yard work  [place] and severity of deficits  are also affecting patient's functional outcome.   REHAB POTENTIAL: Good  CLINICAL DECISION MAKING: Evolving/moderate complexity  EVALUATION COMPLEXITY: Moderate  PLAN: PT FREQUENCY: 2x/week for 8 weeks  PT DURATION: 8 weeks  PLANNED INTERVENTIONS: Therapeutic exercises, Therapeutic activity, Neuromuscular re-education, Balance training, Gait training, Patient/Family education, Self Care, Stair training, Orthotic/Fit training, DME instructions, Aquatic Therapy, and Wheelchair mobility training  PLAN FOR NEXT SESSION:   did pt reach out to [facility] about adjusting her brace?, Squat pivot transfer training toward Rt side:  tall kneeling on mat? Cont with gait training with RW; balance exercises/LLE SLS, strengthening exercises, sit to stand from progressively lower surfaces, work towards floor transfer   Excell Seltzer, PT, DPT, [place] [address] [place], [place] [address] 06/12/2022, 2:50 PM

## 2022-06-13 ENCOUNTER — Telehealth: Payer: Self-pay | Admitting: Physical Medicine & Rehabilitation

## 2022-06-13 NOTE — Telephone Encounter (Signed)
Patient called asking for another referral for OT. She does not feel she has reached maximum benefit. She needs it to improve the functional use of left hand and arm.

## 2022-06-17 ENCOUNTER — Encounter: Payer: Self-pay | Admitting: Physical Therapy

## 2022-06-17 ENCOUNTER — Ambulatory Visit: Payer: Medicare Other | Attending: Internal Medicine | Admitting: Physical Therapy

## 2022-06-17 DIAGNOSIS — R278 Other lack of coordination: Secondary | ICD-10-CM | POA: Insufficient documentation

## 2022-06-17 DIAGNOSIS — M6281 Muscle weakness (generalized): Secondary | ICD-10-CM | POA: Insufficient documentation

## 2022-06-17 DIAGNOSIS — I69154 Hemiplegia and 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 left non-dominant side: Secondary | ICD-10-CM | POA: Diagnosis present

## 2022-06-17 DIAGNOSIS — R2689 Other abnormalities of gait and mobility: Secondary | ICD-10-CM | POA: Insufficient documentation

## 2022-06-17 DIAGNOSIS — R2681 Unsteadiness on feet: Secondary | ICD-10-CM | POA: Diagnosis present

## 2022-06-17 NOTE — Therapy (Signed)
OUTPATIENT PHYSICAL THERAPY NEURO TREATMENT NOTE        Patient Name: Malana Abdelfattah MRN: [id number] DOB:04/19/1955, 67 y.o., female Today's Date: 06/17/2022   PCP: Su Grand, MD REFERRING PROVIDER: Bayard Hugger, NP     PT End of Session - 06/17/22 2006     Visit Number 44    Number of Visits 28   renewal completed for 16 additional visits   Date for PT Re-Evaluation 07/26/22    Authorization Type Medicare    Authorization Time Period 01-03-22 - 04-03-22;  04-01-22 - 06-13-22; 06-03-22 - 08-13-22    Progress Note Due on Visit 80    PT Start Time 1408    PT Stop Time 1455    PT Time Calculation (min) 47 min    Equipment Utilized During Treatment Other (comment)   aquatic cuff   Activity Tolerance Patient tolerated treatment well    Behavior During Therapy [facility] for tasks assessed/performed                                    Past Medical History:  Diagnosis Date   A-fib ([place])    Aortic insufficiency    Hypertension    Stroke [facility])    Past Surgical History:  Procedure Laterality Date   ABDOMINAL HYSTERECTOMY     ABLATION  05/03/2021   heart   c section  01/1968   KNEE SURGERY Right    meniscus   SHOULDER SURGERY Right 08/2014   Patient Active Problem List   Diagnosis Date Noted   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   ICH (intracerebral hemorrhage) ([place]) 10/01/2021   Paroxysmal atrial fibrillation ([place]) 09/29/2021   Stenosis of intracranial vessel 09/29/2021   Essential hypertension 09/29/2021   Blurry vision 09/29/2021   GERD (gastroesophageal reflux disease) 09/29/2021   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 09/25/2021    ONSET DATE: 09-25-21  REFERRING DIAG: I61.9 (ICD-10-CM) - Hemorrhagic stroke ([place])   THERAPY DIAG:  Other abnormalities of gait and mobility  Muscle weakness (generalized)  Unsteadiness on feet  Rationale for Evaluation and Treatment Rehabilitation  SUBJECTIVE:                                                                                                                                                                                               SUBJECTIVE STATEMENT: Pt reports she took a shower by herself yesterday and washed her hair independently; says [name] was just sitting in a nearby room to be able to help if needed, but she  did fine by herself   Pt accompanied by: [name], [name]  PERTINENT HISTORY: 67 year old female here for evaluation of right thalamic intracerebral hemorrhage. Patient is was admitted to the hospital in June 2023 for new onset left-sided weakness headache and nausea. She was found to have intracerebral hemorrhage in the right thalamus. Patient was on Eliquis anticoagulation for atrial fibrillation at the time and this was reversed medically. She was started on hypertonic saline, blood pressure control in ICU admission. Patient went to inpatient rehabilitation. Now patient back home. Still has left-sided weakness and left hand and arm and leg pain. Has been to cardiology who recommended to hold anticoagulation for now as patient is not in atrial fibrillation.   Past medical history of atrial fibrillation on Eliquis aortic insufficiency, hypertension, fibromyalgia  PAIN:  Are you having pain? Yes Location: Lt side of face & chin, edge of Lt arm (lateral side), foot and ankle and Lt buttock  Intensity: 1-2/10  Nerve pain, occasional shock sensation, "rope-like sensation";   04-09-22: not so much in pain but more numbness in Lt side of face - states numbness is moving out    PRECAUTIONS: Fall  WEIGHT BEARING RESTRICTIONS No  FALLS: Has patient fallen in last 6 months? No  LIVING ENVIRONMENT: Lives with: daughter, [name] Lives in: House/apartment Stairs: No - Ramp has been built at front entrance Has following equipment at home: Programmer, multimedia, Environmental consultant - 2 wheeled, and bed side commode  PLOF: Independent; pt did gardening prior to CVA -  was not employed prior to onset of CVA  PATIENT GOALS "be restored back to where I was"   OBJECTIVE:    Aquatic therapy at [facility] - pool temp 90 degrees  Patient seen for aquatic therapy today.  Treatment took place in water 3.5-4.5 feet deep depending upon activity.  Wheelchair was positioned facing chair lift - pt transferred using stand pivot transfer toward Rt side with supervision.  Air cast was donned on her LLE with water shoes.   Runner's stretch for Lt hamstring/gastroc stretching with min assist - 20 sec hold x 1 rep for LLE   Pt performed water walking 18' x 4 reps with use of PT's arm on pt's Rt side for assist with balance;  performed sideways amb. 18' x 4 reps holding onto PT's forearms for UE support  - intermittent assist for neutral positioning of Lt foot to reduce supination   Squats bil. LE's 10 reps;  LLE unilateral squats with min to mod HHA 10 reps    Seated position on bench in pool - pt performed hip abduction/adduction with Lt knee flexed at 90 degrees (min assist to maintain this flexed position); pt performed LAQ's LLE 15 reps with min assist to flex Lt knee 90 degrees  Pt performed Lt hip flexor, extensor, and abductor strengthening with use of aquatic cuff for eccentric strengthening 15 reps each direction   Pt performed LLE step up exercise onto aquatic step in 3.8' water depth 10 reps with RUE support on pool edge  Pt practiced sit to stand from low surface - pt sat on 4th step from bottom of pool and used RUE support on hand rail for sit to stand transfers with min assist - pt performed 5 reps in 1st set and then requested to perform this same exercise again at end of session - pt performed 3 reps in 2nd set with min assist - pt did c/o mild Rt knee discomfort after completion of 2nd set  Pt requires buoyancy of water for support for joint unloading and for reduced fall risk with gait training without device than can be safely performed on land. Water  current produces perturbations for challenge for static & dynamic standing balance.  Buoyancy of water provides unweighting which assists with pain reduction.    TherEx:  Access Code: [email or web address] - issued on 01-17-22 URL: [email or web address] Date: 01/17/2022 Prepared by: Ethelene Browns  Exercises - Seated Soleus Stretch  - 2-3 x daily - 7 x weekly - 1 sets - 2 reps - 15-20 secs  hold - Slant Board Gastrocnemius Stretch  - 3 x daily - 7 x weekly - 1 sets - 2 reps - 15-20 secs hold - Seated Gastroc Stretch with Strap  - 2-3 x daily - 7 x weekly - 1 sets - 2 reps - 15-20 hold   PATIENT EDUCATION:   Education details: continue to practice gait with RW at home despite ongoing anxiety and fear of falling Person educated: Patient Education method: Explanation, Demonstration Education comprehension: verbalized understanding, demonstration   HOME EXERCISE PROGRAM:  Previously issued Access Code: [email or web address] URL: [email or web address] Date: 01/08/2022 Prepared by: Ethelene Browns  Exercises - Bridge  - 1 x daily - 7 x weekly - 1 sets - 10 reps - 5 hold - Single Leg Bridge  - 1 x daily - 7 x weekly - 1 sets - 10 reps - 3 sec hold - Hooklying Clamshell with Resistance  - 1 x daily - 7 x weekly - 1 sets - 10 reps - 2-3 sec hold - Supine Hip Flexion with Resistance Loop  - 1 x daily - 7 x weekly - 3 sets - 10 reps - Supine Heel Slide  - 1 x daily - 7 x weekly - 3 sets - 10 reps - Stepping out/in with LEFT leg  - 1 x daily - 7 x weekly - 3 sets - 10 reps - Hooklying Hamstring Stretch with Strap  - 1 x daily - 7 x weekly - 1 sets - 1-2 reps - 20 sec hold  GOALS: Goals reviewed with patient? Yes      NEW SHORT TERM GOALS:  TARGET DATE 06-28-22   Perform a squat pivot transfer toward Rt side with CGA. Baseline: pt performing stand pivot transfers Goal status:  NEW  2.  Amb. 250' with RW with SBA on flat, even surface to demo improved endurance.  Baseline:  75' with RW    Goal status:  NEW  3. Pt will amb. From lobby into/out of clinic consistently with RW with SBA.  Baseline:  ambulating inconsistently with RW in/out of clinic depending on fatigue/pain, etc.   Goal status:  NEW  4.  Improve Berg score to >/= 38/56 to demo improved balance.  Baseline:  16/56 on 01-18-22; 31/56 on 02-28-22;  33/56 on 04-01-22  Goal status: ONGOING  5.  Perform step negotiation with use of Rt hand rail with SBA (4 steps) using step by step sequence.  Baseline:  min assist  Goal status:  Ongoing/REVISED    6.  Improve TUG score to </= 48 secs with RW to demo improved functional mobility. Baseline:  59 sec with RW mod I (2/21) Goal status:  NEW  7.  Pt will transfer floor to stand with UE support on object with min assist.  Baseline:  to be assessed  Goal status:  NEW    NEW LONG TERM GOALS:  TARGET DATE:  07-26-22  Pt will report ambulating  up/down ramp at her home modified independently with RW in order to access/exit home alone.  Baseline: pt reports she has walked up the ramp with SBA from daughter but has not walked down the ramp as of this time due to fear  Goal status:  NEW  2.  Pt will increase Berg balance test score to >/= 41/56 to reduce fall risk.  Baseline:  33/56 on 04-01-22; Berg score 34/56 on 05-08-54 Goal status: IN PROGRESS/ONGOING  3.  Amb. 350' with RW with SBA for increased community accessibility.  Baseline:  Goal status: UPDATED 06-10-22  4.  Negotiate 4 steps with use of handrail with supervision using step by step sequence. Baseline: min A with handrail x 4 steps (2/21) Goal status: UPDATED  5.  Pt will improve TUG score to </= 43 secs with RW to demo improved functional mobility and reduced fall risk.  Baseline: 2 min with SBQC:  59 sec with RW mod I (2/21) Goal status: GOAL MET  6.  Pt will negotiate ramp and curb with RW with SBA for increased community accessibility.  Baseline: ramp and curb with RW and min A due to fear of  falling (2/21) Goal status: ONGOING    7.  Pt will transfer from garden bench to standing with SBA with use of RW to enable pt to return to leisure activity of planting flowers.   Baseline:  To be assessed   Goal status:  NEW  ASSESSMENT:  CLINICAL IMPRESSION: Aquatic PT session focused on LLE strengthening exercises, gait training and also focused on sit to stand transfer training from low surface with pt sitting on 4th step from bottom of pool with feet on 2nd step initially and then progressed to feet on 3rd step.  Pt used RUE support on hand rail with min assist for this transfer.   Pt performed stand pivot transfer from wheelchair to chair lift with SBA.  Continue POC.    OBJECTIVE IMPAIRMENTS Abnormal gait, decreased activity tolerance, decreased balance, decreased coordination, decreased endurance, decreased knowledge of use of DME, decreased ROM, decreased strength, impaired sensation, impaired tone, and impaired UE functional use.   ACTIVITY LIMITATIONS carrying, lifting, bending, standing, squatting, stairs, transfers, bed mobility, and locomotion level  PARTICIPATION LIMITATIONS: meal prep, cleaning, laundry, driving, shopping, community activity, and yard work  [place] and severity of deficits  are also affecting patient's functional outcome.   REHAB POTENTIAL: Good  CLINICAL DECISION MAKING: Evolving/moderate complexity  EVALUATION COMPLEXITY: Moderate  PLAN: PT FREQUENCY: 2x/week for 8 weeks  PT DURATION: 8 weeks  PLANNED INTERVENTIONS: Therapeutic exercises, Therapeutic activity, Neuromuscular re-education, Balance training, Gait training, Patient/Family education, Self Care, Stair training, Orthotic/Fit training, DME instructions, Aquatic Therapy, and Wheelchair mobility training  PLAN FOR NEXT SESSION:  work on step training - pt states she has steps she must go down to get to her garden area -  tall kneeling on mat? Cont with gait training with  RW; balance exercises/LLE SLS, strengthening exercises   Kayn Haymore, [place], [place] [address] [place], [place] [address] 06/17/2022, 8:11 PM

## 2022-06-18 NOTE — Telephone Encounter (Signed)
Voicemail left on personally identified VM with detailed message per DPR.

## 2022-06-19 ENCOUNTER — Ambulatory Visit: Payer: Medicare Other | Admitting: Physical Therapy

## 2022-06-19 DIAGNOSIS — I69154 Hemiplegia and 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 left non-dominant side: Secondary | ICD-10-CM

## 2022-06-19 DIAGNOSIS — R2681 Unsteadiness on feet: Secondary | ICD-10-CM

## 2022-06-19 DIAGNOSIS — R2689 Other abnormalities of gait and mobility: Secondary | ICD-10-CM

## 2022-06-19 DIAGNOSIS — M6281 Muscle weakness (generalized): Secondary | ICD-10-CM

## 2022-06-19 NOTE — Therapy (Signed)
OUTPATIENT PHYSICAL THERAPY NEURO TREATMENT NOTE        Patient Name: Charlotte Hunter MRN: [id number] DOB:09/09/1955, 67 y.o., female Today's Date: 06/19/2022   PCP: Su Grand, MD REFERRING PROVIDER: Bayard Hugger, NP     PT End of Session - 06/19/22 1318     Visit Number 45    Number of Visits 43   renewal completed for 16 additional visits   Date for PT Re-Evaluation 07/26/22    Authorization Type Medicare    Authorization Time Period 01-03-22 - 04-03-22;  04-01-22 - 06-13-22; 06-03-22 - 08-13-22    Progress Note Due on Visit 29    PT Start Time 1315    PT Stop Time 1357    PT Time Calculation (min) 42 min    Equipment Utilized During Treatment Gait belt    Activity Tolerance Patient tolerated treatment well    Behavior During Therapy WFL for tasks assessed/performed                                     Past Medical History:  Diagnosis Date   A-fib [facility])    Aortic insufficiency    Hypertension    Stroke [facility])    Past Surgical History:  Procedure Laterality Date   ABDOMINAL HYSTERECTOMY     ABLATION  05/03/2021   heart   c section  01/1968   KNEE SURGERY Right    meniscus   SHOULDER SURGERY Right 08/2014   Patient Active Problem List   Diagnosis Date Noted   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   ICH (intracerebral hemorrhage) ([place]) 10/01/2021   Paroxysmal atrial fibrillation ([place]) 09/29/2021   Stenosis of intracranial vessel 09/29/2021   Essential hypertension 09/29/2021   Blurry vision 09/29/2021   GERD (gastroesophageal reflux disease) 09/29/2021   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 09/25/2021    ONSET DATE: 09-25-21  REFERRING DIAG: I61.9 (ICD-10-CM) - Hemorrhagic stroke ([place])   THERAPY DIAG:  Other abnormalities of gait and mobility  Muscle weakness (generalized)  Unsteadiness on feet  Hemiplegia and 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 left non-dominant side  ([place])  Rationale for Evaluation and Treatment Rehabilitation  SUBJECTIVE:                                                                                                                                                                                              SUBJECTIVE STATEMENT: Pt reports that she took her first unsupervised shower at home yesterday while [name] was at work. Pt reports she was  able to do almost everything independently except for she was unable to don her L shoe over her AFO after getting done in the shower. Pt reports that she does have a shoe horn but it didn't help, is going to look into getting a different shoe horn. Pt also reports ongoing nerve pain in her L hand and her buttocks. Pt ambulates in/out of her appointment with her RW, agreeable to have Santiago Glad assist her back to the car at end of session going forwards so as not to use up therapy time with this.  Pt accompanied by: Maxie Barb  PERTINENT HISTORY: 67 year old female here for evaluation of right thalamic intracerebral hemorrhage. Patient is was admitted to the hospital in June 2023 for new onset left-sided weakness headache and nausea. She was found to have intracerebral hemorrhage in the right thalamus. Patient was on Eliquis anticoagulation for atrial fibrillation at the time and this was reversed medically. She was started on hypertonic saline, blood pressure control in ICU admission. Patient went to inpatient rehabilitation. Now patient back home. Still has left-sided weakness and left hand and arm and leg pain. Has been to cardiology who recommended to hold anticoagulation for now as patient is not in atrial fibrillation.   Past medical history of atrial fibrillation on Eliquis aortic insufficiency, hypertension, fibromyalgia  PAIN:  Are you having pain? Yes Location: Lt side of face & chin, edge of Lt arm (lateral side), foot and ankle and Lt buttock  Intensity: 1-2/10  Nerve pain, occasional shock  sensation, "rope-like sensation";   04-09-22: not so much in pain but more numbness in Lt side of face - states numbness is moving out    PRECAUTIONS: Fall  WEIGHT BEARING RESTRICTIONS No  FALLS: Has patient fallen in last 6 months? No  LIVING ENVIRONMENT: Lives with: daughter, [name] Lives in: House/apartment Stairs: No - Ramp has been built at front entrance Has following equipment at home: Programmer, multimedia, Environmental consultant - 2 wheeled, and bed side commode  PLOF: Independent; pt did gardening prior to CVA - was not employed prior to onset of CVA  PATIENT GOALS "be restored back to where I was"   OBJECTIVE:    THER ACT: Pt agreeable to attempt floor transfer this date for fall recovery as well as to assess it this would be safer way of getting up from her garden stool due to low height of stool. Sitting on mat table to sitting on 18" step with airex on it next to mat table with CGA via squat pivot. Sitting on step to tall-kneeling at mat table with mod A for trunk control and repositioning of LLE. Tall-kneeling to sitting on floor with mod A for trunk control and repositioning of LLE. Pt able to return to tall-kneeling position at mat table with mod A needed to elevate hips up from the floor and place LLE in kneeling position. Attempted to have pt bring up her RLE to half-kneel position, pt unable to offload limb to bring it up to this position. Pt requires assist x 2 (one person to assist her with LUE placement on mat table and weight shift to the L, one person to assist with placing RLE in half-kneel position). Once in half-kneel pt unable to power up to return to standing, needs assist x 2 to lift up hips and turn to safely sit on mat table. Pt determined to not be safe to be attempting or performing floor transfers at this time.     TherEx:  Access Code: [email or web address] - issued on 01-17-22 URL: [email or web address] Date: 01/17/2022 Prepared by: Ethelene Browns  Exercises -  Seated Soleus Stretch  - 2-3 x daily - 7 x weekly - 1 sets - 2 reps - 15-20 secs  hold - Slant Board Gastrocnemius Stretch  - 3 x daily - 7 x weekly - 1 sets - 2 reps - 15-20 secs hold - Seated Gastroc Stretch with Strap  - 2-3 x daily - 7 x weekly - 1 sets - 2 reps - 15-20 hold   PATIENT EDUCATION:   Education details: continue to practice gait with RW at home and work on increasing distance Person educated: Patient Education method: Consulting civil engineer, Media planner Education comprehension: verbalized understanding, demonstration   HOME EXERCISE PROGRAM:  Previously issued Access Code: [email or web address] URL: [email or web address] Date: 01/08/2022 Prepared by: Ethelene Browns  Exercises - Bridge  - 1 x daily - 7 x weekly - 1 sets - 10 reps - 5 hold - Single Leg Bridge  - 1 x daily - 7 x weekly - 1 sets - 10 reps - 3 sec hold - Hooklying Clamshell with Resistance  - 1 x daily - 7 x weekly - 1 sets - 10 reps - 2-3 sec hold - Supine Hip Flexion with Resistance Loop  - 1 x daily - 7 x weekly - 3 sets - 10 reps - Supine Heel Slide  - 1 x daily - 7 x weekly - 3 sets - 10 reps - Stepping out/in with LEFT leg  - 1 x daily - 7 x weekly - 3 sets - 10 reps - Hooklying Hamstring Stretch with Strap  - 1 x daily - 7 x weekly - 1 sets - 1-2 reps - 20 sec hold  GOALS: Goals reviewed with patient? Yes      NEW SHORT TERM GOALS:  TARGET DATE 06-28-22   Perform a squat pivot transfer toward Rt side with CGA. Baseline: pt performing stand pivot transfers Goal status:  NEW  2.  Amb. 250' with RW with SBA on flat, even surface to demo improved endurance.  Baseline:  34' with RW   Goal status:  NEW  3. Pt will amb. From lobby into/out of clinic consistently with RW with SBA.  Baseline:  ambulating inconsistently with RW in/out of clinic depending on fatigue/pain, etc.   Goal status:  NEW  4.  Improve Berg score to >/= 38/56 to demo improved balance.  Baseline:  16/56 on 01-18-22; 31/56 on  02-28-22;  33/56 on 04-01-22  Goal status: ONGOING  5.  Perform step negotiation with use of Rt hand rail with SBA (4 steps) using step by step sequence.  Baseline:  min assist  Goal status:  Ongoing/REVISED    6.  Improve TUG score to </= 48 secs with RW to demo improved functional mobility. Baseline:  59 sec with RW mod I (2/21) Goal status:  NEW  7.  Pt will transfer floor to stand with UE support on object with min assist.  Baseline:  to be assessed  Goal status:  NEW    NEW LONG TERM GOALS:  TARGET DATE:  07-26-22  Pt will report ambulating up/down ramp at her home modified independently with RW in order to access/exit home alone.  Baseline: pt reports she has walked up the ramp with SBA from daughter but has not walked down the ramp as of this time due to fear  Goal status:  NEW  2.  Pt will increase Berg balance test  score to >/= 41/56 to reduce fall risk.  Baseline:  33/56 on 04-01-22; Berg score 34/56 on 05-08-54 Goal status: IN PROGRESS/ONGOING  3.  Amb. 350' with RW with SBA for increased community accessibility.  Baseline:  Goal status: UPDATED 06-10-22  4.  Negotiate 4 steps with use of handrail with supervision using step by step sequence. Baseline: min A with handrail x 4 steps (2/21) Goal status: UPDATED  5.  Pt will improve TUG score to </= 43 secs with RW to demo improved functional mobility and reduced fall risk.  Baseline: 2 min with SBQC:  59 sec with RW mod I (2/21) Goal status: GOAL MET  6.  Pt will negotiate ramp and curb with RW with SBA for increased community accessibility.  Baseline: ramp and curb with RW and min A due to fear of falling (2/21) Goal status: ONGOING    7.  Pt will transfer from garden bench to standing with SBA with use of RW to enable pt to return to leisure activity of planting flowers.   Baseline:  To be assessed   Goal status:  NEW  ASSESSMENT:  CLINICAL IMPRESSION: Emphasis of skilled PT session on working on transfers  mat table to/from floor. Pt initially has some anxiety regarding performing transfer but is agreeable to attempt. Pt needs mod A to ultimately transfer from mat table to floor and needs assist x 2 to get back up from the floor due to ongoing weakness. Due to significant difficulty with this task pt would not be safe to work on floor transfers at home. Will continue to progress towards sit to stands from lower surface to work towards pt goal of sitting on her garden bench. Continue POC.     OBJECTIVE IMPAIRMENTS Abnormal gait, decreased activity tolerance, decreased balance, decreased coordination, decreased endurance, decreased knowledge of use of DME, decreased ROM, decreased strength, impaired sensation, impaired tone, and impaired UE functional use.   ACTIVITY LIMITATIONS carrying, lifting, bending, standing, squatting, stairs, transfers, bed mobility, and locomotion level  PARTICIPATION LIMITATIONS: meal prep, cleaning, laundry, driving, shopping, community activity, and yard work  [place] and severity of deficits  are also affecting patient's functional outcome.   REHAB POTENTIAL: Good  CLINICAL DECISION MAKING: Evolving/moderate complexity  EVALUATION COMPLEXITY: Moderate  PLAN: PT FREQUENCY: 2x/week for 8 weeks  PT DURATION: 8 weeks  PLANNED INTERVENTIONS: Therapeutic exercises, Therapeutic activity, Neuromuscular re-education, Balance training, Gait training, Patient/Family education, Self Care, Stair training, Orthotic/Fit training, DME instructions, Aquatic Therapy, and Wheelchair mobility training  PLAN FOR NEXT SESSION:  work on step training - pt states she has steps she must go down to get to her garden area -  tall kneeling on mat? Cont with gait training with RW; balance exercises/LLE SLS, strengthening exercises   Excell Seltzer, PT, DPT, [place] [address] [place], [place] [address] 06/19/2022, 1:58 PM

## 2022-06-24 ENCOUNTER — Ambulatory Visit: Payer: Medicare Other | Admitting: Physical Therapy

## 2022-06-24 ENCOUNTER — Encounter: Payer: Self-pay | Admitting: Physical Therapy

## 2022-06-24 DIAGNOSIS — R2681 Unsteadiness on feet: Secondary | ICD-10-CM

## 2022-06-24 DIAGNOSIS — M6281 Muscle weakness (generalized): Secondary | ICD-10-CM

## 2022-06-24 DIAGNOSIS — R2689 Other abnormalities of gait and mobility: Secondary | ICD-10-CM

## 2022-06-24 DIAGNOSIS — I69154 Hemiplegia and 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 left non-dominant side: Secondary | ICD-10-CM

## 2022-06-24 NOTE — Therapy (Signed)
OUTPATIENT PHYSICAL THERAPY NEURO TREATMENT NOTE        Patient Name: Charlotte Hunter MRN: [id number] DOB:[birth year], 67 y.o., female Today's Date: 06/24/2022   PCP: Charlotte Grand, MD REFERRING PROVIDER: Bayard Hugger, NP     PT End of Session - 06/24/22 2018     Visit Number 46    Number of Visits 55   renewal completed for 16 additional visits   Date for PT Re-Evaluation 07/26/22    Authorization Type Medicare    Authorization Time Period 01-03-22 - 04-03-22;  04-01-22 - 06-13-22; 06-03-22 - 08-13-22    Progress Note Due on Visit 74    PT Start Time 1400    PT Stop Time [id number]    PT Time Calculation (min) 45 min    Equipment Utilized During Treatment Other (comment)   bar bell, aquatic step, floatation belt   Activity Tolerance Patient tolerated treatment well    Behavior During Therapy WFL for tasks assessed/performed                                    Past Medical History:  Diagnosis Date   A-fib ([place])    Aortic insufficiency    Hypertension    Stroke [facility])    Past Surgical History:  Procedure Laterality Date   ABDOMINAL HYSTERECTOMY     ABLATION  05/03/2021   heart   c section  01/1968   KNEE SURGERY Right    meniscus   SHOULDER SURGERY Right 08/2014   Patient Active Problem List   Diagnosis Date Noted   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   ICH (intracerebral hemorrhage) ([place]) 10/01/2021   Paroxysmal atrial fibrillation ([place]) 09/29/2021   Stenosis of intracranial vessel 09/29/2021   Essential hypertension 09/29/2021   Blurry vision 09/29/2021   GERD (gastroesophageal reflux disease) 09/29/2021   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 09/25/2021    ONSET DATE: 09-25-21  REFERRING DIAG: I61.9 (ICD-10-CM) - Hemorrhagic stroke ([place])   THERAPY DIAG:  Other abnormalities of gait and mobility  Muscle weakness (generalized)  Unsteadiness on feet  Hemiplegia and hemiparesis following nontraumatic  intracerebral hemorrhage affecting left non-dominant side ([place])  Rationale for Evaluation and Treatment Rehabilitation  SUBJECTIVE:                                                                                                                                                                                              SUBJECTIVE STATEMENT: Pt had Lt AFO adjusted at [facility] last Thursday; states Charlotte Hunter, prosthetist, said she could  wear the AFO in the pool - told her to dry the screws after completing aquatic exercise and told her that if they rusted they would just replace them; pt wearing AFO with water shoes in today's session  Pt accompanied by: [name], Charlotte Hunter  PERTINENT HISTORY: 67 year old female here for evaluation of right thalamic intracerebral hemorrhage. Patient is was admitted to the hospital in June 2023 for new onset left-sided weakness headache and nausea. She was found to have intracerebral hemorrhage in the right thalamus. Patient was on Eliquis anticoagulation for atrial fibrillation at the time and this was reversed medically. She was started on hypertonic saline, blood pressure control in ICU admission. Patient went to inpatient rehabilitation. Now patient back home. Still has left-sided weakness and left hand and arm and leg pain. Has been to cardiology who recommended to hold anticoagulation for now as patient is not in atrial fibrillation.   Past medical history of atrial fibrillation on Eliquis aortic insufficiency, hypertension, fibromyalgia  PAIN:  Are you having pain? Yes Location: Lt side of face & chin, edge of Lt arm (lateral side), foot and ankle and Lt buttock  Intensity: 1-2/10  Nerve pain, occasional shock sensation, "rope-like sensation";   04-09-22: not so much in pain but more numbness in Lt side of face - states numbness is moving out    PRECAUTIONS: Fall  WEIGHT BEARING RESTRICTIONS No  FALLS: Has patient fallen in last 6 months? No  LIVING  ENVIRONMENT: Lives with: daughter, Charlotte Hunter Lives in: House/apartment Stairs: No - Ramp has been built at front entrance Has following equipment at home: Programmer, multimedia, Environmental consultant - 2 wheeled, and bed side commode  PLOF: Independent; pt did gardening prior to CVA - was not employed prior to onset of CVA  PATIENT GOALS "be restored back to where I was"   OBJECTIVE:    Aquatic therapy at [place] - pool temp 90 degrees  Patient seen for aquatic therapy today.  Treatment took place in water 3.5-4.5 feet deep depending on activity.  Pt entered the pool via chair lift and exited the pool via step negotiation with use of bil. Hand rails with CGA to min assist.    Runner's stretch for Lt hamstring/gastroc stretching with min assist - 20 sec hold x 1 rep for LLE   Pt performed water walking 18' x 4 reps with use of side of pool or large single barbell;  performed sideways amb. 18' x 4 reps holding onto PT's forearms for UE support; backwards amb. 18' x 4 reps with UE support on large bar bell stabilized by PT  Squats bil. LE's 10 reps;  LLE unilateral squats with min assist on pool edge 10 reps    Pt performed LLE step up exercise onto aquatic step in 3.8' water depth 10 reps with RUE support on pool edge; step down exercise LLE for quad eccentric strengthening 10 reps   Pt performed stepping with RLE  for improved LLE SLS and closed chain strengthening - pt held Lt knee in slight flexion - stepped forward/back, out/in and back/forward with RLE 10 reps each direction   Flotation belt was placed on patient with Nekdoodle for head support - pt supported in supine position by PT - performed hip abduction/adduction 20 reps; bicycled LE's approx. 20 reps Performed simulated leg press exercise with feet on side of pool -pt was pushed toward wall for LE hip & knee flexion - pushed away for strengthening of hip & knee extensors  Pt performed  water walking at end of session 18' x 1 rep across pool with  SBA  Pt requires buoyancy of water for support for joint unloading and for reduced fall risk with gait training without device than can be safely performed on land. Water current produces perturbations for challenge for static & dynamic standing balance.  Buoyancy of water provides unweighting which assists with pain reduction.    TherEx:  Access Code: [email or web address] - issued on 01-17-22 URL: [email or web address] Date: 01/17/2022 Prepared by: Ethelene Browns  Exercises - Seated Soleus Stretch  - 2-3 x daily - 7 x weekly - 1 sets - 2 reps - 15-20 secs  hold - Slant Board Gastrocnemius Stretch  - 3 x daily - 7 x weekly - 1 sets - 2 reps - 15-20 secs hold - Seated Gastroc Stretch with Strap  - 2-3 x daily - 7 x weekly - 1 sets - 2 reps - 15-20 hold   PATIENT EDUCATION:   Education details: continue to practice gait with RW at home despite ongoing anxiety and fear of falling Person educated: Patient Education method: Explanation, Demonstration Education comprehension: verbalized understanding, demonstration   HOME EXERCISE PROGRAM:  Previously issued Access Code: [email or web address] URL: [email or web address] Date: 01/08/2022 Prepared by: Ethelene Browns  Exercises - Bridge  - 1 x daily - 7 x weekly - 1 sets - 10 reps - 5 hold - Single Leg Bridge  - 1 x daily - 7 x weekly - 1 sets - 10 reps - 3 sec hold - Hooklying Clamshell with Resistance  - 1 x daily - 7 x weekly - 1 sets - 10 reps - 2-3 sec hold - Supine Hip Flexion with Resistance Loop  - 1 x daily - 7 x weekly - 3 sets - 10 reps - Supine Heel Slide  - 1 x daily - 7 x weekly - 3 sets - 10 reps - Stepping out/in with LEFT leg  - 1 x daily - 7 x weekly - 3 sets - 10 reps - Hooklying Hamstring Stretch with Strap  - 1 x daily - 7 x weekly - 1 sets - 1-2 reps - 20 sec hold  GOALS: Goals reviewed with patient? Yes      NEW SHORT TERM GOALS:  TARGET DATE 06-28-22   Perform a squat pivot transfer toward Rt side with  CGA. Baseline: pt performing stand pivot transfers Goal status:  NEW  2.  Amb. 250' with RW with SBA on flat, even surface to demo improved endurance.  Baseline:  43' with RW   Goal status:  NEW  3. Pt will amb. From lobby into/out of clinic consistently with RW with SBA.  Baseline:  ambulating inconsistently with RW in/out of clinic depending on fatigue/pain, etc.   Goal status:  NEW  4.  Improve Berg score to >/= 38/56 to demo improved balance.  Baseline:  16/56 on 01-18-22; 31/56 on 02-28-22;  33/56 on 04-01-22  Goal status: ONGOING  5.  Perform step negotiation with use of Rt hand rail with SBA (4 steps) using step by step sequence.  Baseline:  min assist  Goal status:  Ongoing/REVISED    6.  Improve TUG score to </= 48 secs with RW to demo improved functional mobility. Baseline:  59 sec with RW mod I (2/21) Goal status:  NEW  7.  Pt will transfer floor to stand with UE support on object with min assist.  Baseline:  to be assessed  Goal status:  NEW  NEW LONG TERM GOALS:  TARGET DATE:  07-26-22  Pt will report ambulating up/down ramp at her home modified independently with RW in order to access/exit home alone.  Baseline: pt reports she has walked up the ramp with SBA from daughter but has not walked down the ramp as of this time due to fear  Goal status:  NEW  2.  Pt will increase Berg balance test score to >/= 41/56 to reduce fall risk.  Baseline:  33/56 on 04-01-22; Berg score 34/56 on 05-08-54 Goal status: IN PROGRESS/ONGOING  3.  Amb. 350' with RW with SBA for increased community accessibility.  Baseline:  Goal status: UPDATED 06-10-22  4.  Negotiate 4 steps with use of handrail with supervision using step by step sequence. Baseline: min A with handrail x 4 steps (2/21) Goal status: UPDATED  5.  Pt will improve TUG score to </= 43 secs with RW to demo improved functional mobility and reduced fall risk.  Baseline: 2 min with SBQC:  59 sec with RW mod I  (2/21) Goal status: GOAL MET  6.  Pt will negotiate ramp and curb with RW with SBA for increased community accessibility.  Baseline: ramp and curb with RW and min A due to fear of falling (2/21) Goal status: ONGOING    7.  Pt will transfer from garden bench to standing with SBA with use of RW to enable pt to return to leisure activity of planting flowers.   Baseline:  To be assessed   Goal status:  NEW  ASSESSMENT:  CLINICAL IMPRESSION: Aquatic PT session focused on LLE strengthening exercises and gait training with pt wearing her custom AFO on LLE.  Gait much improved in aquatic environment with increased weight bearing achieved on LLE due to ankle stability provided by AFO.  LLE SLS exercises able to be safely performed without risk of ankle injury.  Pt performed step negotiation for  exiting pool with use of hand rails for first time in today's session, rather than using chair lift for exiting pool.  Aquatic exercise much more beneficial with use of AFO to allow gait and balance training without support of PT's HHA, with pt using bar bells for increased challenge with balance with gait.  Continue POC.    OBJECTIVE IMPAIRMENTS Abnormal gait, decreased activity tolerance, decreased balance, decreased coordination, decreased endurance, decreased knowledge of use of DME, decreased ROM, decreased strength, impaired sensation, impaired tone, and impaired UE functional use.   ACTIVITY LIMITATIONS carrying, lifting, bending, standing, squatting, stairs, transfers, bed mobility, and locomotion level  PARTICIPATION LIMITATIONS: meal prep, cleaning, laundry, driving, shopping, community activity, and yard work  [place] and severity of deficits  are also affecting patient's functional outcome.   REHAB POTENTIAL: Good  CLINICAL DECISION MAKING: Evolving/moderate complexity  EVALUATION COMPLEXITY: Moderate  PLAN: PT FREQUENCY: 2x/week for 8 weeks  PT DURATION: 8  weeks  PLANNED INTERVENTIONS: Therapeutic exercises, Therapeutic activity, Neuromuscular re-education, Balance training, Gait training, Patient/Family education, Self Care, Stair training, Orthotic/Fit training, DME instructions, Aquatic Therapy, and Wheelchair mobility training  PLAN FOR NEXT SESSION:  Check STG's:  Cont with gait training with RW; balance exercises/LLE SLS, strengthening exercises   Jaimen Melone, [place], [place] [address] [place], [place] [address] 06/24/2022, 8:21 PM

## 2022-06-26 ENCOUNTER — Ambulatory Visit: Payer: Medicare Other | Admitting: Physical Therapy

## 2022-06-26 VITALS — BP 153/83 | HR 80

## 2022-06-26 DIAGNOSIS — M6281 Muscle weakness (generalized): Secondary | ICD-10-CM

## 2022-06-26 DIAGNOSIS — R2689 Other abnormalities of gait and mobility: Secondary | ICD-10-CM

## 2022-06-26 DIAGNOSIS — I69154 Hemiplegia and 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 left non-dominant side: Secondary | ICD-10-CM

## 2022-06-26 DIAGNOSIS — R2681 Unsteadiness on feet: Secondary | ICD-10-CM

## 2022-06-26 NOTE — Therapy (Signed)
OUTPATIENT PHYSICAL THERAPY NEURO TREATMENT NOTE        Patient Name: Charlotte Hunter MRN: [id number] DOB:13-Aug-1955, 67 y.o., female Today's Date: 06/26/2022   PCP: Su Grand, MD REFERRING PROVIDER: Bayard Hugger, NP     PT End of Session - 06/26/22 1450     Visit Number 66    Number of Visits 40   renewal completed for 16 additional visits   Date for PT Re-Evaluation 07/26/22    Authorization Type Medicare    Authorization Time Period 01-03-22 - 04-03-22;  04-01-22 - 06-13-22; 06-03-22 - 08-13-22    Progress Note Due on Visit 72    PT Start Time 1450    PT Stop Time 1533    PT Time Calculation (min) 43 min    Equipment Utilized During Treatment Gait belt    Activity Tolerance Patient tolerated treatment well    Behavior During Therapy WFL for tasks assessed/performed                                     Past Medical History:  Diagnosis Date   A-fib [facility])    Aortic insufficiency    Hypertension    Stroke [facility])    Past Surgical History:  Procedure Laterality Date   ABDOMINAL HYSTERECTOMY     ABLATION  05/03/2021   heart   c section  01/1968   KNEE SURGERY Right    meniscus   SHOULDER SURGERY Right 08/2014   Patient Active Problem List   Diagnosis Date Noted   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   ICH (intracerebral hemorrhage) ([place]) 10/01/2021   Paroxysmal atrial fibrillation ([place]) 09/29/2021   Stenosis of intracranial vessel 09/29/2021   Essential hypertension 09/29/2021   Blurry vision 09/29/2021   GERD (gastroesophageal reflux disease) 09/29/2021   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 09/25/2021    ONSET DATE: 09-25-21  REFERRING DIAG: I61.9 (ICD-10-CM) - Hemorrhagic stroke ([place])   THERAPY DIAG:  Other abnormalities of gait and mobility  Muscle weakness (generalized)  Unsteadiness on feet  Hemiplegia and 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 left non-dominant  side ([place])  Rationale for Evaluation and Treatment Rehabilitation  SUBJECTIVE:                                                                                                                                                                                              SUBJECTIVE STATEMENT: Pt received in lobby seated in clinic, w/c, reports that she forgot her RW today so she was unable to ambulate  into her appointment. Pt continues to experience return of sensation in L hemibody. No falls or other acute changes since last visit.  Pt accompanied by: Maxie Barb  PERTINENT HISTORY: 67 year old female here for evaluation of right thalamic intracerebral hemorrhage. Patient is was admitted to the hospital in June 2023 for new onset left-sided weakness headache and nausea. She was found to have intracerebral hemorrhage in the right thalamus. Patient was on Eliquis anticoagulation for atrial fibrillation at the time and this was reversed medically. She was started on hypertonic saline, blood pressure control in ICU admission. Patient went to inpatient rehabilitation. Now patient back home. Still has left-sided weakness and left hand and arm and leg pain. Has been to cardiology who recommended to hold anticoagulation for now as patient is not in atrial fibrillation.   Past medical history of atrial fibrillation on Eliquis aortic insufficiency, hypertension, fibromyalgia  PAIN:  Are you having pain? Yes Location: Lt side of face & chin, edge of Lt arm (lateral side), foot and ankle and Lt buttock  Intensity: 1-2/10  Nerve pain, occasional shock sensation, "rope-like sensation";   04-09-22: not so much in pain but more numbness in Lt side of face - states numbness is moving out    PRECAUTIONS: Fall  WEIGHT BEARING RESTRICTIONS No  FALLS: Has patient fallen in last 6 months? No  LIVING ENVIRONMENT: Lives with: daughter, [name] Lives in: House/apartment Stairs: No - Ramp has been built at front  entrance Has following equipment at home: Programmer, multimedia, Environmental consultant - 2 wheeled, and bed side commode  PLOF: Independent; pt did gardening prior to CVA - was not employed prior to onset of CVA  PATIENT GOALS "be restored back to where I was"   OBJECTIVE:    TherAct:  [facility] PT Assessment - 06/26/22 1513       Standardized Balance Assessment   Standardized Balance Assessment Timed Up and Go Test;Berg Balance Test      Berg Balance Test   Sit to Stand Able to stand without using hands and stabilize independently    Standing Unsupported Able to stand 2 minutes with supervision    Sitting with Back Unsupported but Feet Supported on Floor or Stool Able to sit safely and securely 2 minutes    Stand to Sit Sits safely with minimal use of hands    Transfers Able to transfer safely, definite need of hands    Standing Unsupported with Eyes Closed Needs help to keep from falling    Standing Unsupported with Feet Together Able to place feet together independently and stand for 1 minute with supervision    From Standing, Reach Forward with Outstretched Arm Can reach confidently >25 cm (10")    From Standing Position, Pick up Object from Floor Able to pick up shoe, needs supervision    From Standing Position, Turn to Look Behind Over each Shoulder Looks behind one side only/other side shows less weight shift    Turn 360 Degrees Needs close supervision or verbal cueing    Standing Unsupported, Alternately Place Feet on Step/Stool Able to complete >2 steps/needs minimal assist    Standing Unsupported, One Foot in Front Able to take small step independently and hold 30 seconds    Standing on One Leg Able to lift leg independently and hold equal to or more than 3 seconds    Total Score 37      Timed Up and Go Test   TUG Normal TUG    Normal TUG (  seconds) 65   with RW           Gait:  RAMP:  Level of Assistance: Min A Assistive device utilized: Environmental consultant - 2 wheeled Ramp Comments:  ascend/descend ramp with RW and min A for balance, assist for RW management due to increased anxiety and fear of falling when descending ramp. Pt with onset of dizziness following ramp navigation, BP as noted below and symptoms resolve with seated rest break. Then pt's friend Santiago Glad able to perform return demonstration of assisting pt on ramp so that she feels comfortable walking up/down ramp at home with assist.   Vitals:   06/26/22 1503  BP: (!) 153/83  Pulse: 80     GAIT: Gait pattern: decreased hip/knee flexion- Left, decreased ankle dorsiflexion- Left, circumduction- Left, and genu recurvatum- Left Distance walked: various clinic distances, from therapy gym back to lobby at end of session with RW Assistive device utilized: Walker - 2 wheeled Level of assistance: SBA Comments: back to lobby from therapy gym at end of session, w/c follow due to pt fear of falling     TherEx:  Access Code: [email or web address] - issued on 01-17-22 URL: [email or web address] Date: 01/17/2022 Prepared by: Ethelene Browns  Exercises - Seated Soleus Stretch  - 2-3 x daily - 7 x weekly - 1 sets - 2 reps - 15-20 secs  hold - Slant Board Gastrocnemius Stretch  - 3 x daily - 7 x weekly - 1 sets - 2 reps - 15-20 secs hold - Seated Gastroc Stretch with Strap  - 2-3 x daily - 7 x weekly - 1 sets - 2 reps - 15-20 hold   PATIENT EDUCATION:   Education details: continue to practice gait with RW at home despite ongoing anxiety and fear of falling Person educated: Patient Education method: Explanation, Demonstration Education comprehension: verbalized understanding, demonstration   HOME EXERCISE PROGRAM:  Previously issued Access Code: [email or web address] URL: [email or web address] Date: 01/08/2022 Prepared by: Ethelene Browns  Exercises - Bridge  - 1 x daily - 7 x weekly - 1 sets - 10 reps - 5 hold - Single Leg Bridge  - 1 x daily - 7 x weekly - 1 sets - 10 reps - 3 sec hold - Hooklying Clamshell with  Resistance  - 1 x daily - 7 x weekly - 1 sets - 10 reps - 2-3 sec hold - Supine Hip Flexion with Resistance Loop  - 1 x daily - 7 x weekly - 3 sets - 10 reps - Supine Heel Slide  - 1 x daily - 7 x weekly - 3 sets - 10 reps - Stepping out/in with LEFT leg  - 1 x daily - 7 x weekly - 3 sets - 10 reps - Hooklying Hamstring Stretch with Strap  - 1 x daily - 7 x weekly - 1 sets - 1-2 reps - 20 sec hold  GOALS: Goals reviewed with patient? Yes      NEW SHORT TERM GOALS:  TARGET DATE 06-28-22***   Perform a squat pivot transfer toward Rt side with CGA. Baseline: pt performing stand pivot transfers Goal status:  NEW  2.  Amb. 250' with RW with SBA on flat, even surface to demo improved endurance.  Baseline:  65' with RW   Goal status:  NEW  3. Pt will amb. From lobby into/out of clinic consistently with RW with SBA.  Baseline:  ambulating inconsistently with RW in/out of clinic depending on fatigue/pain, etc.   Goal status:  NEW  4.  Improve Berg score to >/= 38/56 to demo improved balance.  Baseline:  16/56 on 01-18-22; 31/56 on 02-28-22;  33/56 on 04-01-22  Goal status: ONGOING  5.  Perform step negotiation with use of Rt hand rail with SBA (4 steps) using step by step sequence.  Baseline:  min assist  Goal status:  Ongoing/REVISED    6.  Improve TUG score to </= 48 secs with RW to demo improved functional mobility. Baseline:  59 sec with RW mod I (2/21) Goal status:  NEW  7.  Pt will transfer floor to stand with UE support on object with min assist.  Baseline:  to be assessed  Goal status:  NEW    NEW LONG TERM GOALS:  TARGET DATE:  07-26-22  Pt will report ambulating up/down ramp at her home modified independently with RW in order to access/exit home alone.  Baseline: pt reports she has walked up the ramp with SBA from daughter but has not walked down the ramp as of this time due to fear  Goal status:  NEW  2.  Pt will increase Berg balance test score to >/= 41/56 to  reduce fall risk.  Baseline:  33/56 on 04-01-22; Berg score 34/56 on 05-08-54 Goal status: IN PROGRESS/ONGOING  3.  Amb. 350' with RW with SBA for increased community accessibility.  Baseline:  Goal status: UPDATED 06-10-22  4.  Negotiate 4 steps with use of handrail with supervision using step by step sequence. Baseline: min A with handrail x 4 steps (2/21) Goal status: UPDATED  5.  Pt will improve TUG score to </= 43 secs with RW to demo improved functional mobility and reduced fall risk.  Baseline: 2 min with SBQC:  59 sec with RW mod I (2/21) Goal status: GOAL MET  6.  Pt will negotiate ramp and curb with RW with SBA for increased community accessibility.  Baseline: ramp and curb with RW and min A due to fear of falling (2/21) Goal status: ONGOING    7.  Pt will transfer from garden bench to standing with SBA with use of RW to enable pt to return to leisure activity of planting flowers.   Baseline:  To be assessed   Goal status:  NEW  ASSESSMENT:  CLINICAL IMPRESSION: Emphasis of skilled PT session***on training pt's friend Santiago Glad on how to safely assist her with ramp navigation with her RW and on initiating assessment of STG. Pt has met ***/*** STG that were assessed this date, remainder of goals to be assessed during next land appointment. Pt's friend exhibits good return demonstration of how to safely assist pt on ramp with her RW, safe to continue to practice this at home. Pt continues to benefit from skilled therapy services to address ongoing L hemibody weakness leading to decreased safety and independence with functional mobility. Continue POC.    OBJECTIVE IMPAIRMENTS Abnormal gait, decreased activity tolerance, decreased balance, decreased coordination, decreased endurance, decreased knowledge of use of DME, decreased ROM, decreased strength, impaired sensation, impaired tone, and impaired UE functional use.   ACTIVITY LIMITATIONS carrying, lifting, bending, standing,  squatting, stairs, transfers, bed mobility, and locomotion level  PARTICIPATION LIMITATIONS: meal prep, cleaning, laundry, driving, shopping, community activity, and yard work  [place] and severity of deficits  are also affecting patient's functional outcome.   REHAB POTENTIAL: Good  CLINICAL DECISION MAKING: Evolving/moderate complexity  EVALUATION COMPLEXITY: Moderate  PLAN: PT FREQUENCY: 2x/week for 8 weeks  PT DURATION: 8 weeks  PLANNED INTERVENTIONS: Therapeutic  exercises, Therapeutic activity, Neuromuscular re-education, Balance training, Gait training, Patient/Family education, Self Care, Stair training, Orthotic/Fit training, DME instructions, Aquatic Therapy, and Wheelchair mobility training  PLAN FOR NEXT SESSION:  Check STG's:  Cont with gait training with RW; balance exercises/LLE SLS, strengthening exercises***next land visit: assess remaining STG, work on sit to stand from garden bench/cart   Excell Seltzer, PT, DPT, [place] [address] [place], [place] [address] 06/26/2022, 3:33 PM

## 2022-07-01 ENCOUNTER — Ambulatory Visit: Payer: Medicare Other | Admitting: Physical Therapy

## 2022-07-01 ENCOUNTER — Encounter: Payer: Self-pay | Admitting: Physical Therapy

## 2022-07-01 DIAGNOSIS — R2689 Other abnormalities of gait and mobility: Secondary | ICD-10-CM

## 2022-07-01 DIAGNOSIS — R2681 Unsteadiness on feet: Secondary | ICD-10-CM

## 2022-07-01 DIAGNOSIS — I69154 Hemiplegia and 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 left non-dominant side: Secondary | ICD-10-CM

## 2022-07-01 NOTE — Therapy (Signed)
OUTPATIENT PHYSICAL THERAPY NEURO TREATMENT NOTE        Patient Name: Charlotte Hunter MRN: [id number] DOB:[birth year], 67 y.o., female Today's Date: 07/01/2022   PCP: Charlotte Grand, MD REFERRING PROVIDER: Bayard Hugger, NP     PT End of Session - 07/01/22 1919     Visit Number 48    Number of Visits 9   renewal completed for 16 additional visits   Date for PT Re-Evaluation 07/26/22    Authorization Type Medicare    Authorization Time Period 01-03-22 - 04-03-22;  04-01-22 - 06-13-22; 06-03-22 - 08-13-22    Progress Note Due on Visit 40    PT Start Time 1405    PT Stop Time 1500    PT Time Calculation (min) 55 min    Equipment Utilized During Treatment Other (comment)   bar bells, pool noodle, aquatic cuff   Activity Tolerance Patient tolerated treatment well    Behavior During Therapy WFL for tasks assessed/performed                                    Past Medical History:  Diagnosis Date   A-fib [facility])    Aortic insufficiency    Hypertension    Stroke [facility])    Past Surgical History:  Procedure Laterality Date   ABDOMINAL HYSTERECTOMY     ABLATION  05/03/2021   heart   c section  01/1968   KNEE SURGERY Right    meniscus   SHOULDER SURGERY Right 08/2014   Patient Active Problem List   Diagnosis Date Noted   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   ICH (intracerebral hemorrhage) ([place]) 10/01/2021   Paroxysmal atrial fibrillation ([place]) 09/29/2021   Stenosis of intracranial vessel 09/29/2021   Essential hypertension 09/29/2021   Blurry vision 09/29/2021   GERD (gastroesophageal reflux disease) 09/29/2021   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 09/25/2021    ONSET DATE: 09-25-21  REFERRING DIAG: I61.9 (ICD-10-CM) - Hemorrhagic stroke ([place])   THERAPY DIAG:  Other abnormalities of gait and mobility  Unsteadiness on feet  Hemiplegia and 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 left  non-dominant side ([place])  Rationale for Evaluation and Treatment Rehabilitation  SUBJECTIVE:                                                                                                                                                                                              SUBJECTIVE STATEMENT: Pt reports she walked from passenger's side of car around to the driver's side with her cane and drove  very short distance from parking lot to [employer] on Friday and then walked back around to the passenger's side so that Charlotte Hunter could drive back home; pt reports she walked on gravel in her driveway with her walker with her friend's assistance - says she did OK walking on the gravel  Pt accompanied by: [name], Charlotte Hunter  PERTINENT HISTORY: 67 year old female here for evaluation of right thalamic intracerebral hemorrhage. Patient is was admitted to the hospital in June 2023 for new onset left-sided weakness headache and nausea. She was found to have intracerebral hemorrhage in the right thalamus. Patient was on Eliquis anticoagulation for atrial fibrillation at the time and this was reversed medically. She was started on hypertonic saline, blood pressure control in ICU admission. Patient went to inpatient rehabilitation. Now patient back home. Still has left-sided weakness and left hand and arm and leg pain. Has been to cardiology who recommended to hold anticoagulation for now as patient is not in atrial fibrillation.   Past medical history of atrial fibrillation on Eliquis aortic insufficiency, hypertension, fibromyalgia  PAIN:  Are you having pain? Yes Location: Lt side of face & chin, edge of Lt arm (lateral side), foot and ankle and Lt buttock  Intensity: 1-2/10  Nerve pain, occasional shock sensation, "rope-like sensation";   04-09-22: not so much in pain but more numbness in Lt side of face - states numbness is moving out    PRECAUTIONS: Fall  WEIGHT BEARING RESTRICTIONS No  FALLS: Has  patient fallen in last 6 months? No  LIVING ENVIRONMENT: Lives with: daughter, Charlotte Hunter Lives in: House/apartment Stairs: No - Ramp has been built at front entrance Has following equipment at home: Programmer, multimedia, Environmental consultant - 2 wheeled, and bed side commode  PLOF: Independent; pt did gardening prior to CVA - was not employed prior to onset of CVA  PATIENT GOALS "be restored back to where I was"   OBJECTIVE:    Aquatic therapy at [place] - pool temp 90 degrees  Patient seen for aquatic therapy today.  Treatment took place in water 3.5-4.5 feet deep depending on activity.  Pt entered the pool via chair lift and exited the pool via step negotiation with use of bil. Hand rails with CGA to min assist.    Runner's stretch for Lt hamstring/gastroc stretching with min assist - 20 sec hold x 1 rep for LLE   Pt performed water walking 18' x 4 reps with use of pool noodle initially, no device and then use of bar bells for reciprocal UE movement during water walking; pt performed sideways amb. 18' x 4 reps holding onto PT's forearms for UE support; backwards amb. 18' x 4 reps with UE support on PT's arms  Squats bil. LE's 10 reps;  LLE unilateral squats with min assist on pool edge 15 reps    Pt performed LLE step up exercise onto aquatic step in 3.8' water depth 15 reps with RUE support  Pt performed stepping with RLE  for improved LLE SLS and closed chain strengthening - pt held Lt knee in slight flexion - stepped forward/back, out/in and back/forward with RLE 10 reps each direction  Pt performed water walking at end of session 18' x 1 rep across pool with SBA  Pt performed tall kneeling on aquatic step on pool floor - needed min to mod assist to decrease floatation and to keep knees on step; pt performed 10 mini squats in tall kneeling position; pt performed Lt hip extension/flexion 10 reps in  tall kneeling position  Pt performed Lt hip flexion, extension, abduction with aquatic cuff 10 reps  each direction:  Lt hip abduction/adduction with knee flexed at 90 degrees 10 reps  Performed Lt knee extension/flexion in seated position - no resistance 10 reps with occasional assist to complete flexion to 90 degrees for Lt heel to touch pool bench wall  Pt performed closed chain with Lt foot on solid green noodle - pushing noodle down to floor for hip extension strengthening 10 reps  Pt requires buoyancy of water for support for joint unloading and for reduced fall risk with gait training without device than can be safely performed on land. Water current produces perturbations for challenge for static & dynamic standing balance.  Buoyancy of water provides unweighting which assists with pain reduction.    TherEx:  Access Code: [email or web address] - issued on 01-17-22 URL: [email or web address] Date: 01/17/2022 Prepared by: Ethelene Browns  Exercises - Seated Soleus Stretch  - 2-3 x daily - 7 x weekly - 1 sets - 2 reps - 15-20 secs  hold - Slant Board Gastrocnemius Stretch  - 3 x daily - 7 x weekly - 1 sets - 2 reps - 15-20 secs hold - Seated Gastroc Stretch with Strap  - 2-3 x daily - 7 x weekly - 1 sets - 2 reps - 15-20 hold   PATIENT EDUCATION:   Education details: continue to practice gait with RW at home despite ongoing anxiety and fear of falling Person educated: Patient Education method: Explanation, Demonstration Education comprehension: verbalized understanding, demonstration   HOME EXERCISE PROGRAM:  Previously issued Access Code: [email or web address] URL: [email or web address] Date: 01/08/2022 Prepared by: Ethelene Browns  Exercises - Bridge  - 1 x daily - 7 x weekly - 1 sets - 10 reps - 5 hold - Single Leg Bridge  - 1 x daily - 7 x weekly - 1 sets - 10 reps - 3 sec hold - Hooklying Clamshell with Resistance  - 1 x daily - 7 x weekly - 1 sets - 10 reps - 2-3 sec hold - Supine Hip Flexion with Resistance Loop  - 1 x daily - 7 x weekly - 3 sets - 10 reps - Supine Heel  Slide  - 1 x daily - 7 x weekly - 3 sets - 10 reps - Stepping out/in with LEFT leg  - 1 x daily - 7 x weekly - 3 sets - 10 reps - Hooklying Hamstring Stretch with Strap  - 1 x daily - 7 x weekly - 1 sets - 1-2 reps - 20 sec hold  GOALS: Goals reviewed with patient? Yes     NEW SHORT TERM GOALS:  TARGET DATE 06-28-22   Perform a squat pivot transfer toward Rt side with CGA. Baseline: pt performing stand pivot transfers Goal status:  NEW  2.  Amb. 250' with RW with SBA on flat, even surface to demo improved endurance.  Baseline:  20' with RW   Goal status:  NEW  3. Pt will amb. From lobby into/out of clinic consistently with RW with SBA.  Baseline:  ambulating inconsistently with RW in/out of clinic depending on fatigue/pain, etc. ; more consistent as of 3/14  Goal status:  ONGOING  4.  Improve Berg score to >/= 38/56 to demo improved balance.  Baseline:  16/56 on 01-18-22; 31/56 on 02-28-22;  33/56 on 04-01-22, 37/56 (3/14)  Goal status: ONGOING-IN PROGRESS  5.  Perform step negotiation with use of Rt hand rail with SBA (4 steps)  using step by step sequence.  Baseline:  min assist  Goal status:  Ongoing/REVISED    6.  Improve TUG score to </= 48 secs with RW to demo improved functional mobility. Baseline:  59 sec with RW mod I (2/21), 65 sec with RW mod I (3/14) Goal status:  ONGOING  7.  Pt will transfer floor to stand with UE support on object with min assist.  Baseline:  to be assessed, max A x 2 to transfer floor to mat table  Goal status:  NOT MET-GOAL D/C AS UNSAFE TO CONTINUE TO ATTEMPT     NEW LONG TERM GOALS:  TARGET DATE:  07-26-22  Pt will report ambulating up/down ramp at her home modified independently with RW in order to access/exit home alone.  Baseline: pt reports she has walked up the ramp with SBA from daughter but has not walked down the ramp as of this time due to fear  Goal status:  NEW  2.  Pt will increase Berg balance test score to >/= 41/56 to  reduce fall risk.  Baseline:  33/56 on 04-01-22; Berg score 34/56 on 05[birth year]08-54 Goal status: IN PROGRESS/ONGOING  3.  Amb. 350' with RW with SBA for increased community accessibility.  Baseline:  Goal status: UPDATED 06-10-22  4.  Negotiate 4 steps with use of handrail with supervision using step by step sequence. Baseline: min A with handrail x 4 steps (2/21) Goal status: UPDATED  5.  Pt will improve TUG score to </= 43 secs with RW to demo improved functional mobility and reduced fall risk.  Baseline: 2 min with SBQC:  59 sec with RW mod I (2/21) Goal status: GOAL MET  6.  Pt will negotiate ramp and curb with RW with SBA for increased community accessibility.  Baseline: ramp and curb with RW and min A due to fear of falling (2/21) Goal status: ONGOING    7.  Pt will transfer from garden bench to standing with SBA with use of RW to enable pt to return to leisure activity of planting flowers.   Baseline:  To be assessed   Goal status:  NEW  ASSESSMENT:  CLINICAL IMPRESSION: Aquatic PT session focused on LLE strengthening exercises and gait training with pt wearing her custom AFO on LLE.  Tactile cues were given on hips to increase lateral weight shift onto LLE in stance with verbal cue to take longer step with RLE.  Pt able to perform water walking without UE support on barbell or noodle.  Pt's gait speed remains limited by lack of sensation in LLE.  Pt did demonstrate increased active Lt knee flexion in seated position in today's session with pt able to flex Lt knee to approx. 85 to 90 degrees with intermittent assist at end ROM, demonstrating increased Lt hamstring activation and strength.  Continue POC.    OBJECTIVE IMPAIRMENTS Abnormal gait, decreased activity tolerance, decreased balance, decreased coordination, decreased endurance, decreased knowledge of use of DME, decreased ROM, decreased strength, impaired sensation, impaired tone, and impaired UE functional use.   ACTIVITY  LIMITATIONS carrying, lifting, bending, standing, squatting, stairs, transfers, bed mobility, and locomotion level  PARTICIPATION LIMITATIONS: meal prep, cleaning, laundry, driving, shopping, community activity, and yard work  [place] and severity of deficits  are also affecting patient's functional outcome.   REHAB POTENTIAL: Good  CLINICAL DECISION MAKING: Evolving/moderate complexity  EVALUATION COMPLEXITY: Moderate  PLAN: PT FREQUENCY: 2x/week for 8 weeks  PT DURATION: 8 weeks  PLANNED INTERVENTIONS: Therapeutic  exercises, Therapeutic activity, Neuromuscular re-education, Balance training, Gait training, Patient/Family education, Self Care, Stair training, Orthotic/Fit training, DME instructions, Aquatic Therapy, and Wheelchair mobility training  PLAN FOR NEXT SESSION:  Cont with gait training with RW; balance exercises/LLE SLS, strengthening exercises   Larrisha Babineau, [place], [place] [address] [place], [place] [address] 07/01/2022, 7:26 PM

## 2022-07-03 ENCOUNTER — Ambulatory Visit: Payer: Medicare Other | Admitting: Physical Therapy

## 2022-07-08 ENCOUNTER — Ambulatory Visit: Payer: Medicare Other | Admitting: Physical Therapy

## 2022-07-08 DIAGNOSIS — I69154 Hemiplegia and 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 left non-dominant side: Secondary | ICD-10-CM

## 2022-07-08 DIAGNOSIS — R2681 Unsteadiness on feet: Secondary | ICD-10-CM

## 2022-07-08 DIAGNOSIS — R2689 Other abnormalities of gait and mobility: Secondary | ICD-10-CM | POA: Diagnosis not present

## 2022-07-09 ENCOUNTER — Encounter: Payer: Self-pay | Admitting: Physical Therapy

## 2022-07-09 NOTE — Therapy (Signed)
OUTPATIENT PHYSICAL THERAPY NEURO TREATMENT NOTE        Patient Name: Charlotte Hunter MRN: [id number] DOB:06/14/1955, 67 y.o., female Today's Date: 07/09/2022   PCP: Su Grand, MD REFERRING PROVIDER: Bayard Hugger, NP     PT End of Session - 07/09/22 0749     Visit Number 55    Number of Visits 62   renewal completed for 16 additional visits   Date for PT Re-Evaluation 07/26/22    Authorization Type Medicare    Authorization Time Period 01-03-22 - 04-03-22;  04-01-22 - 06-13-22; 06-03-22 - 08-13-22    Progress Note Due on Visit 40    PT Start Time 1530    PT Stop Time 1620    PT Time Calculation (min) 50 min    Equipment Utilized During Treatment Other (comment)   bar bells, pool noodle, aquatic cuff   Activity Tolerance Patient tolerated treatment well    Behavior During Therapy [facility] for tasks assessed/performed                                    Past Medical History:  Diagnosis Date   A-fib [facility])    Aortic insufficiency    Hypertension    Stroke [facility])    Past Surgical History:  Procedure Laterality Date   ABDOMINAL HYSTERECTOMY     ABLATION  05/03/2021   heart   c section  01/1968   KNEE SURGERY Right    meniscus   SHOULDER SURGERY Right 08/2014   Patient Active Problem List   Diagnosis Date Noted   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   ICH (intracerebral hemorrhage) ([place]) 10/01/2021   Paroxysmal atrial fibrillation ([place]) 09/29/2021   Stenosis of intracranial vessel 09/29/2021   Essential hypertension 09/29/2021   Blurry vision 09/29/2021   GERD (gastroesophageal reflux disease) 09/29/2021   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 09/25/2021    ONSET DATE: 09-25-21  REFERRING DIAG: I61.9 (ICD-10-CM) - Hemorrhagic stroke ([place])   THERAPY DIAG:  Other abnormalities of gait and mobility  Unsteadiness on feet  Hemiplegia and 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 left  non-dominant side ([place])  Rationale for Evaluation and Treatment Rehabilitation  SUBJECTIVE:                                                                                                                                                                                              SUBJECTIVE STATEMENT: Pt reports she has not had a good week - has not done a lot of walking at home  this week.  Requests to continue PT until June - wants to be able to mow her lawn with a pushmower, walk on grass and gravel and be able to put her walker or quad cane in/out of car to have for when she starts driving  Pt accompanied by: [name], [name]  PERTINENT HISTORY: 67 year old female here for evaluation of right thalamic intracerebral hemorrhage. Patient is was admitted to the hospital in June 2023 for new onset left-sided weakness headache and nausea. She was found to have intracerebral hemorrhage in the right thalamus. Patient was on Eliquis anticoagulation for atrial fibrillation at the time and this was reversed medically. She was started on hypertonic saline, blood pressure control in ICU admission. Patient went to inpatient rehabilitation. Now patient back home. Still has left-sided weakness and left hand and arm and leg pain. Has been to cardiology who recommended to hold anticoagulation for now as patient is not in atrial fibrillation.   Past medical history of atrial fibrillation on Eliquis aortic insufficiency, hypertension, fibromyalgia  PAIN:  Are you having pain? Yes Location: Lt side of face & chin, edge of Lt arm (lateral side), foot and ankle and Lt buttock  Intensity: 1-2/10  Nerve pain, occasional shock sensation, "rope-like sensation";   04-09-22: not so much in pain but more numbness in Lt side of face - states numbness is moving out    PRECAUTIONS: Fall  WEIGHT BEARING RESTRICTIONS No  FALLS: Has patient fallen in last 6 months? No  LIVING ENVIRONMENT: Lives with: daughter,  [name] Lives in: House/apartment Stairs: No - Ramp has been built at front entrance Has following equipment at home: Programmer, multimedia, Environmental consultant - 2 wheeled, and bed side commode  PLOF: Independent; pt did gardening prior to CVA - was not employed prior to onset of CVA  PATIENT GOALS "be restored back to where I was"   OBJECTIVE:    Aquatic therapy at [facility] - pool temp 90 degrees  Patient seen for aquatic therapy today.  Treatment took place in water 3.5-4.5 feet deep depending on activity.  Pt entered and exited the pool via step negotiation with use of bil. Hand rails with CGA to min assist.  (First time entering pool by step negotiation)  Runner's stretch for Lt hamstring/gastroc stretching with min assist - 20 sec hold x 1 rep for LLE   Pt performed water walking 18' x 4 reps with no device and then use of bar bells for reciprocal UE movement during water walking; pt performed sideways amb. 18' x 4 reps holding onto PT's forearms for UE support; backwards amb. 18' x 4 reps with minimal UE support on PT's arms  Squats bil. LE's 10 reps;  LLE unilateral squats with min assist on pool edge 15 reps    Pt performed stepping with RLE  for improved LLE SLS and closed chain strengthening - pt held Lt knee in slight flexion - stepped forward/back, out/in and back/forward with RLE 10 reps each direction  Pt performed water walking at end of session 18' x 1 rep across pool with SBA  Pt performed Lt hip flexion, extension, abduction with aquatic cuff 10 reps each direction:  Lt hip abduction/adduction with knee flexed at 90 degrees 10 reps  Performed Lt knee extension/flexion in seated position - no resistance 10 reps with min assist to complete flexion to 90 degrees for Lt heel to touch pool bench wall  Pt requires buoyancy of water for support for joint unloading and for reduced  fall risk with gait training without device than can be safely performed on land. Water current produces  perturbations for challenge for static & dynamic standing balance.  Buoyancy of water provides unweighting which assists with pain reduction.     TherEx:  Access Code: [email or web address] - issued on 01-17-22 URL: [email or web address] Date: 01/17/2022 Prepared by: Ethelene Browns  Exercises - Seated Soleus Stretch  - 2-3 x daily - 7 x weekly - 1 sets - 2 reps - 15-20 secs  hold - Slant Board Gastrocnemius Stretch  - 3 x daily - 7 x weekly - 1 sets - 2 reps - 15-20 secs hold - Seated Gastroc Stretch with Strap  - 2-3 x daily - 7 x weekly - 1 sets - 2 reps - 15-20 hold   PATIENT EDUCATION:   Education details: continue to practice gait with RW at home despite ongoing anxiety and fear of falling Person educated: Patient Education method: Explanation, Demonstration Education comprehension: verbalized understanding, demonstration   HOME EXERCISE PROGRAM:  Previously issued Access Code: [email or web address] URL: [email or web address] Date: 01/08/2022 Prepared by: Ethelene Browns  Exercises - Bridge  - 1 x daily - 7 x weekly - 1 sets - 10 reps - 5 hold - Single Leg Bridge  - 1 x daily - 7 x weekly - 1 sets - 10 reps - 3 sec hold - Hooklying Clamshell with Resistance  - 1 x daily - 7 x weekly - 1 sets - 10 reps - 2-3 sec hold - Supine Hip Flexion with Resistance Loop  - 1 x daily - 7 x weekly - 3 sets - 10 reps - Supine Heel Slide  - 1 x daily - 7 x weekly - 3 sets - 10 reps - Stepping out/in with LEFT leg  - 1 x daily - 7 x weekly - 3 sets - 10 reps - Hooklying Hamstring Stretch with Strap  - 1 x daily - 7 x weekly - 1 sets - 1-2 reps - 20 sec hold  GOALS: Goals reviewed with patient? Yes     NEW SHORT TERM GOALS:  TARGET DATE 06-28-22   Perform a squat pivot transfer toward Rt side with CGA. Baseline: pt performing stand pivot transfers Goal status: GOAL MET 07-08-22 at pool  2.  Amb. 250' with RW with SBA on flat, even surface to demo improved endurance.  Baseline:  32' with  RW   Goal status:  NEW  3. Pt will amb. From lobby into/out of clinic consistently with RW with SBA.  Baseline:  ambulating inconsistently with RW in/out of clinic depending on fatigue/pain, etc. ; more consistent as of 3/14  Goal status:  ONGOING  4.  Improve Berg score to >/= 38/56 to demo improved balance.  Baseline:  16/56 on 01-18-22; 31/56 on 02-28-22;  33/56 on 04-01-22, 37/56 (3/14)  Goal status: ONGOING-IN PROGRESS  5.  Perform step negotiation with use of Rt hand rail with SBA (4 steps) using step by step sequence.  Baseline:  min assist  Goal status:  Ongoing/REVISED    6.  Improve TUG score to </= 48 secs with RW to demo improved functional mobility. Baseline:  59 sec with RW mod I (2/21), 65 sec with RW mod I (3/14) Goal status:  ONGOING  7.  Pt will transfer floor to stand with UE support on object with min assist.  Baseline:  to be assessed, max A x 2 to transfer floor to mat table  Goal status:  NOT MET-GOAL D/C  AS UNSAFE TO CONTINUE TO ATTEMPT     NEW LONG TERM GOALS:  TARGET DATE:  07-26-22  Pt will report ambulating up/down ramp at her home modified independently with RW in order to access/exit home alone.  Baseline: pt reports she has walked up the ramp with SBA from daughter but has not walked down the ramp as of this time due to fear  Goal status:  NEW  2.  Pt will increase Berg balance test score to >/= 41/56 to reduce fall risk.  Baseline:  33/56 on 04-01-22; Berg score 34/56 on 05-08-54 Goal status: IN PROGRESS/ONGOING  3.  Amb. 350' with RW with SBA for increased community accessibility.  Baseline:  Goal status: UPDATED 06-10-22  4.  Negotiate 4 steps with use of handrail with supervision using step by step sequence. Baseline: min A with handrail x 4 steps (2/21) Goal status: UPDATED  5.  Pt will improve TUG score to </= 43 secs with RW to demo improved functional mobility and reduced fall risk.  Baseline: 2 min with SBQC:  59 sec with RW mod I  (2/21) Goal status: GOAL MET  6.  Pt will negotiate ramp and curb with RW with SBA for increased community accessibility.  Baseline: ramp and curb with RW and min A due to fear of falling (2/21) Goal status: ONGOING    7.  Pt will transfer from garden bench to standing with SBA with use of RW to enable pt to return to leisure activity of planting flowers.   Baseline:  To be assessed   Goal status:  NEW  ASSESSMENT:  CLINICAL IMPRESSION: Aquatic PT session focused on LLE strengthening exercises and gait training with RUE on pool edge initially, progressing to no UE support.  Pt had more difficulty actively flexing Lt knee to 90 degrees in seated position in today's session compared to previous PT session last week.  Pt wearing AFO on LLE which significantly improves weight shifting and weight bearing on LLE in stance phase of gait with water walking by preventing Lt ankle supination due to increased tone.  Pt entered pool via step negotiation for first time in today's session (had previously entered pool via chair lift).  Continue POC.    OBJECTIVE IMPAIRMENTS Abnormal gait, decreased activity tolerance, decreased balance, decreased coordination, decreased endurance, decreased knowledge of use of DME, decreased ROM, decreased strength, impaired sensation, impaired tone, and impaired UE functional use.   ACTIVITY LIMITATIONS carrying, lifting, bending, standing, squatting, stairs, transfers, bed mobility, and locomotion level  PARTICIPATION LIMITATIONS: meal prep, cleaning, laundry, driving, shopping, community activity, and yard work  [place] and severity of deficits  are also affecting patient's functional outcome.   REHAB POTENTIAL: Good  CLINICAL DECISION MAKING: Evolving/moderate complexity  EVALUATION COMPLEXITY: Moderate  PLAN: PT FREQUENCY: 2x/week for 8 weeks  PT DURATION: 8 weeks  PLANNED INTERVENTIONS: Therapeutic exercises, Therapeutic activity,  Neuromuscular re-education, Balance training, Gait training, Patient/Family education, Self Care, Stair training, Orthotic/Fit training, DME instructions, Aquatic Therapy, and Wheelchair mobility training  PLAN FOR NEXT SESSION:  Do 10th visit progress note;  work on walking on grass, practice getting RW/LBQC in/out car (per pt's request): Cont with gait training with RW/LBQC; balance exercises/LLE SLS, strengthening exercises   Dejanee Thibeaux, [place], [place] [address] [place], [place] [address] 07/09/2022, 7:54 AM

## 2022-07-10 ENCOUNTER — Ambulatory Visit: Payer: Medicare Other | Admitting: Physical Therapy

## 2022-07-10 DIAGNOSIS — M6281 Muscle weakness (generalized): Secondary | ICD-10-CM

## 2022-07-10 DIAGNOSIS — R2689 Other abnormalities of gait and mobility: Secondary | ICD-10-CM | POA: Diagnosis not present

## 2022-07-10 DIAGNOSIS — R2681 Unsteadiness on feet: Secondary | ICD-10-CM

## 2022-07-10 DIAGNOSIS — R278 Other lack of coordination: Secondary | ICD-10-CM

## 2022-07-10 NOTE — Therapy (Signed)
OUTPATIENT PHYSICAL THERAPY NEURO TREATMENT NOTE-10TH VISIT PROGRESS NOTE        Patient Name: Charlotte Hunter MRN: [id number] DOB:1956-02-25, 67 y.o., female Today's Date: 07/10/2022   PCP: Su Grand, MD REFERRING PROVIDER: Bayard Hugger, NP   Physical Therapy Progress Note   Dates of Reporting Period: 06/03/2022 - 07/10/2022  See Note below for Objective Data and Assessment of Progress/Goals.  Thank you for the referral of this patient. Excell Seltzer, PT, DPT, CSRS     PT End of Session - 07/10/22 1319     Visit Number 50    Number of Visits 24   renewal completed for 16 additional visits   Date for PT Re-Evaluation 07/26/22    Authorization Type Medicare    Authorization Time Period 01-03-22 - 04-03-22;  04-01-22 - 06-13-22; 06-03-22 - 08-13-22    Progress Note Due on Visit 52    PT Start Time 1315    PT Stop Time 1405    PT Time Calculation (min) 50 min    Equipment Utilized During Treatment Gait belt   bar bells, pool noodle, aquatic cuff   Activity Tolerance Patient tolerated treatment well    Behavior During Therapy WFL for tasks assessed/performed                                      Past Medical History:  Diagnosis Date   A-fib [facility])    Aortic insufficiency    Hypertension    Stroke [facility])    Past Surgical History:  Procedure Laterality Date   ABDOMINAL HYSTERECTOMY     ABLATION  05/03/2021   heart   c section  01/1968   KNEE SURGERY Right    meniscus   SHOULDER SURGERY Right 08/2014   Patient Active Problem List   Diagnosis Date Noted   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   ICH (intracerebral hemorrhage) ([place]) 10/01/2021   Paroxysmal atrial fibrillation ([place]) 09/29/2021   Stenosis of intracranial vessel 09/29/2021   Essential hypertension 09/29/2021   Blurry vision 09/29/2021   GERD (gastroesophageal reflux disease) 09/29/2021   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 Pt working towards being independent at home so her daughter can move out. Pt's biggest concerns with independence include being able to take out the trash from the house, take out trash to the curb, and being able to use her toilet as her shower bench blocks the toilet currently due to having a small bathroom.  Pt accompanied by: Maxie Barb  PERTINENT HISTORY: 67 year old female here for evaluation of right thalamic intracerebral hemorrhage. Patient is was admitted to the hospital in June 2023 for new onset left-sided weakness headache and nausea. She was found to have intracerebral hemorrhage in the right thalamus. Patient was on Eliquis anticoagulation for atrial fibrillation at the time and this was reversed medically. She was started on hypertonic saline, blood pressure control in ICU admission. Patient went to inpatient rehabilitation. Now patient back home. Still has left-sided weakness and left hand and arm and leg pain. Has been to cardiology who recommended to hold anticoagulation for now as patient is not in atrial fibrillation.   Past medical history of atrial fibrillation on Eliquis aortic insufficiency, hypertension, fibromyalgia  PAIN:  Are you having pain? Yes Location: Lt side of face & chin, edge of Lt arm (lateral side), foot and ankle and Lt buttock  Intensity: 1-2/10  Nerve pain, occasional shock sensation,  "rope-like sensation";   04-09-22: not so much in pain but more numbness in Lt side of face - states numbness is moving out    PRECAUTIONS: Fall  WEIGHT BEARING RESTRICTIONS No  FALLS: Has patient fallen in last 6 months? No  LIVING ENVIRONMENT: Lives with: daughter, [name] Lives in: House/apartment Stairs: No - Ramp has been built at front entrance Has following equipment at home: Programmer, multimedia, Environmental consultant - 2 wheeled, and bed side commode  PLOF: Independent; pt did gardening prior to CVA - was not employed prior to onset of CVA  PATIENT GOALS "be restored back to where I was"   OBJECTIVE:    Gait:  RAMP:  Level of Assistance: CGA Assistive device utilized: Environmental consultant - 2 wheeled Ramp Comments: therapist assisting with bracing RW during descent due to pt fear of falling   STAIRS:  Level of Assistance: CGA  Stair Negotiation Technique: Sideways with Single Rail on Right  Number of Stairs: 4  Height of Stairs: 6"  Comments: laterally descend, ascend going forwards with step-to  GAIT: Gait pattern: decreased hip/knee flexion- Left, circumduction- Left, and genu recurvatum- Left Distance walked: various clinic distances, pt ambulates in/out of appointment from car and lobby as well Assistive device utilized: Walker - 2 wheeled Level of assistance: SBA Comments: decreased L knee flexion noted this date       TherEx:  Access Code: [place] - issued on 01-17-22 URL: [email or web address] Date: 01/17/2022 Prepared by: Ethelene Browns  Exercises - Seated Soleus Stretch  - 2-3 x daily - 7 x weekly - 1 sets - 2 reps - 15-20 secs  hold - Slant Board Gastrocnemius Stretch  - 3 x daily - 7 x weekly - 1 sets - 2 reps - 15-20 secs hold - Seated Gastroc Stretch with Strap  - 2-3 x daily - 7 x weekly - 1 sets - 2 reps - 15-20 hold   PATIENT EDUCATION:   Education details: continue to practice gait with RW at home despite ongoing anxiety and fear of falling, work on  heel slides for hamstring strengthening Person educated: Patient Education  method: Explanation, Demonstration Education comprehension: verbalized understanding, demonstration   HOME EXERCISE PROGRAM:  Previously issued Access Code: [email or web address] URL: [email or web address] Date: 01/08/2022 Prepared by: Ethelene Browns  Exercises - Bridge  - 1 x daily - 7 x weekly - 1 sets - 10 reps - 5 hold - Single Leg Bridge  - 1 x daily - 7 x weekly - 1 sets - 10 reps - 3 sec hold - Hooklying Clamshell with Resistance  - 1 x daily - 7 x weekly - 1 sets - 10 reps - 2-3 sec hold - Supine Hip Flexion with Resistance Loop  - 1 x daily - 7 x weekly - 3 sets - 10 reps - Supine Heel Slide  - 1 x daily - 7 x weekly - 3 sets - 10 reps - Stepping out/in with LEFT leg  - 1 x daily - 7 x weekly - 3 sets - 10 reps - Hooklying Hamstring Stretch with Strap  - 1 x daily - 7 x weekly - 1 sets - 1-2 reps - 20 sec hold  GOALS: Goals reviewed with patient? Yes      NEW SHORT TERM GOALS:  TARGET DATE 06-28-22   Perform a squat pivot transfer toward Rt side with CGA. Baseline: pt performing stand pivot transfers Goal status:  NEW  2.  Amb. 250' with RW with SBA on flat, even surface to demo improved endurance.  Baseline:  7' with RW   Goal status:  NEW  3. Pt will amb. From lobby into/out of clinic consistently with RW with SBA.  Baseline:  ambulating inconsistently with RW in/out of clinic depending on fatigue/pain, etc. ; more consistent as of 3/14  Goal status:  ONGOING  4.  Improve Berg score to >/= 38/56 to demo improved balance.  Baseline:  16/56 on 01-18-22; 31/56 on 02-28-22;  33/56 on 04-01-22, 37/56 (3/14)  Goal status: ONGOING-IN PROGRESS  5.  Perform step negotiation with use of Rt hand rail with SBA (4 steps) using step by step sequence.  Baseline:  min assist  Goal status:  Ongoing/REVISED    6.  Improve TUG score to </= 48 secs with RW to demo improved functional mobility. Baseline:   59 sec with RW mod I (2/21), 65 sec with RW mod I (3/14) Goal status:  ONGOING  7.  Pt will transfer floor to stand with UE support on object with min assist.  Baseline:  to be assessed, max A x 2 to transfer floor to mat table  Goal status:  NOT MET-GOAL D/C AS UNSAFE TO CONTINUE TO ATTEMPT    NEW LONG TERM GOALS:  TARGET DATE:  07-26-22  Pt will report ambulating up/down ramp at her home modified independently with RW in order to access/exit home alone.  Baseline: pt reports she has walked up the ramp with SBA from daughter but has not walked down the ramp as of this time due to fear  Goal status:  NEW  2.  Pt will increase Berg balance test score to >/= 41/56 to reduce fall risk.  Baseline:  33/56 on 04-01-22; Berg score 34/56 on 05-08-54, 37/56 (3/14) Goal status: IN PROGRESS/ONGOING  3.  Amb. 350' with RW with SBA for increased community accessibility.  Baseline:  Goal status: UPDATED 06-10-22  4.  Negotiate 4 steps with use of handrail with supervision using step by step sequence. Baseline: min A with handrail x 4 steps (2/21) Goal status: UPDATED  5.  Pt will improve TUG score  to </= 50 secs with RW to demo improved functional mobility and reduced fall risk.  Baseline: 2 min with SBQC:  59 sec with RW mod I (2/21), 65 sec with RW mod I (3/14) Goal status: ONGOING-REVISED  6.  Pt will negotiate ramp and curb with RW with SBA for increased community accessibility.  Baseline: ramp and curb with RW and min A due to fear of falling (2/21) Goal status: ONGOING    7.  Pt will transfer from garden bench to standing with SBA with use of RW to enable pt to return to leisure activity of planting flowers.   Baseline:  To be assessed   Goal status:  NEW  ASSESSMENT:  CLINICAL IMPRESSION: Emphasis of skilled PT session on working in gait training on stairs and ramp and discussing PT POC. Pt exhibits improved overall confidence with gait with RW this date and safely ambulates in/out  of her appointment from the car. Pt with some anxiety and decreased balance when descending stairs laterally as this is a novel task this session, would benefit from continued practice. Also discussed PT POC with plan to d/c after mid-April when this POC ends. Discussed plan for patient to continue with her HEP and working on gait at home for a few months after d/c before returning to PT. Also discussed that pt would not be safe to mow her lawn due to impaired balance and would not be safe to transfer to her low garden bench with wheels. Can further discuss modified ways to garden in future sessions. Continue POC.   OBJECTIVE IMPAIRMENTS Abnormal gait, decreased activity tolerance, decreased balance, decreased coordination, decreased endurance, decreased knowledge of use of DME, decreased ROM, decreased strength, impaired sensation, impaired tone, and impaired UE functional use.   ACTIVITY LIMITATIONS carrying, lifting, bending, standing, squatting, stairs, transfers, bed mobility, and locomotion level  PARTICIPATION LIMITATIONS: meal prep, cleaning, laundry, driving, shopping, community activity, and yard work  [place] and severity of deficits  are also affecting patient's functional outcome.   REHAB POTENTIAL: Good  CLINICAL DECISION MAKING: Evolving/moderate complexity  EVALUATION COMPLEXITY: Moderate  PLAN: PT FREQUENCY: 2x/week for 8 weeks  PT DURATION: 8 weeks  PLANNED INTERVENTIONS: Therapeutic exercises, Therapeutic activity, Neuromuscular re-education, Balance training, Gait training, Patient/Family education, Self Care, Stair training, Orthotic/Fit training, DME instructions, Aquatic Therapy, and Wheelchair mobility training  PLAN FOR NEXT SESSION:  Cont with gait training with RW; balance exercises/LLE SLS, strengthening exercises; work on [employer] , work on walking on grass, practice getting RW/LBQC in/out car (per pt's request): Cont with gait training with  RW/LBQC; balance exercises/LLE SLS, strengthening exercises ; heel slides; discuss ways to modify gardening   Excell Seltzer, PT, DPT, [place] [address] [place], [place] [address] 07/10/2022, 2:19 PM

## 2022-07-11 ENCOUNTER — Encounter: Payer: Medicare Other | Attending: Physical Medicine & Rehabilitation | Admitting: Physical Medicine & Rehabilitation

## 2022-07-11 ENCOUNTER — Encounter: Payer: Self-pay | Admitting: Physical Medicine & Rehabilitation

## 2022-07-11 VITALS — BP 154/83 | HR 71 | Ht 66.0 in | Wt 177.0 lb

## 2022-07-11 DIAGNOSIS — I619 Nontraumatic intracerebral hemorrhage, unspecified: Secondary | ICD-10-CM | POA: Diagnosis present

## 2022-07-11 MED ORDER — DULOXETINE HCL 20 MG PO CPEP: 20.0000 mg | ORAL_CAPSULE | Freq: Every day | ORAL | 5 refills | Status: DC

## 2022-07-11 MED ORDER — METHOCARBAMOL 500 MG PO TABS: ORAL_TABLET | ORAL | 1 refills | Status: DC

## 2022-07-11 MED ORDER — GABAPENTIN 400 MG PO CAPS: 400.0000 mg | ORAL_CAPSULE | Freq: Four times a day (QID) | ORAL | 5 refills | Status: DC

## 2022-07-11 NOTE — Progress Notes (Signed)
Subjective:    Patient ID: Charlotte Hunter, female    DOB: [birth year], 67 y.o.   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  Repeat CT of the head 10/11/2021 showed fading blood products within the right thalamus.  Regional hypodensity persist in my reflect a combination of residual edema and developing myelomalacia.  Mild regional mass effect diminished since 10/02/2021.  No new intracranial abnormality.  Patient noted history of PAF at this time would remain off of Eliquis and follow-up with cardiology services Dr.Komado of [facility] health [facility].  Cardiac rate remained controlled.  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     67 y.o. right-handed female with history of hypertension, recurrent UTIs maintained on prophylactic Macrodantin aortic insufficiency, PAF with history of ablation maintained on Eliquis.  Her latest cardiology notes that she was planning Xarelto to see if this may be more affordable for her however it was never started and she was maintained on Eliquis.  Per chart review patient lives alone.  Daughter in the area.  Presented 09/25/2021 with acute onset of left-sided weakness as well as blurred vision.  CT of the head showed hemorrhage centered in the right thalamus and basal ganglia with mild surrounding edema, minimal midline shift and a small amount of intraventricular extension into the third and fourth ventricle.  Patient was reversed with Andexxa.  Follow-up MRI/MRA unchanged size of morphology of acute intraparenchymal hemorrhage.  No hydrocephalus or trapping.  There was a noted focal severe distal left P2 stenosis on MRA.  Admission chemistries unremarkable except potassium 3.2 urinalysis negative.   Echocardiogram with ejection fraction of 65 to 70% no wall motion abnormalities.  Patient did require short-term intubation through 09/27/2021.  Maintained on a mechanical soft nectar thick liquid diet and advance to thin liquids 10/01/2021.     Admit date: 10/01/2021 Discharge date: 11/06/2021 HPI  67 year old female with history of right thalamic hemorrhage causing left hemiparesis, left hemisensory deficits.  Left neglect has largely improved since inpatient rehab discharge.  She is getting outpatient PT both aquatic as well as land-based therapy.  The patient was discharged from OT 1 month ago after reaching a plateau in her progress.  The patient worked with 2 different therapist.  She feels that 1 therapist work more on her scapular stability whereas the other 1 concentrated more on fine motor.  The patient felt she had more benefit when working on her  scapular stability.  The patient would like to be referred for OT. The patient has had no falls or trauma.  She ambulated from the door to the clinic today for the first time.  She is now climbing steps.  She is using a rolling walker but has also used a cane.  She lives in a 1 level home and is able to enter the bathroom. Pain Inventory Average Pain 7 Pain Right Now 7 My pain is constant, burning, tingling, and aching  LOCATION OF PAIN  Hand, foot and Buttocks  BOWEL Number of stools per week: 4 Oral laxative use Yes  Type of laxative Ducolax Enema or suppository use No  History of colostomy No  Incontinent No   BLADDER Normal In and out cath, frequency . Able to self cath No  Bladder incontinence No  Frequent  urination No  Leakage with coughing No  Difficulty starting stream No  Incomplete bladder emptying No    Mobility use a cane use a walker ability to climb steps?  yes do you drive?  no use a wheelchair transfers alone Do you have any goals in this area?  yes  Function Do you have any goals in this area?   no  Neuro/Psych dizziness  Prior Studies Any changes since last visit?  no  Physicians involved in your care Any changes since last visit?  no   History reviewed. No pertinent family history. Social History   Socioeconomic History   Marital status: Single    Spouse name: Not on file   Number of children: 1   Years of education: Not on file   Highest education level: Not on file  Occupational History   Not on file  Tobacco Use   Smoking status: Never   Smokeless tobacco: Never  Vaping Use   Vaping Use: Never used  Substance and Sexual Activity   Alcohol use: Not Currently    Alcohol/week: 1.0 standard drink of alcohol    Types: 1 Glasses of wine per week   Drug use: Never   Sexual activity: Not Currently  Other Topics Concern   Not on file  Social History Narrative   12/27/21 dgtr lives with her   Social Determinants of Health   Financial Resource Strain: Not on file  Food Insecurity: Not on file  Transportation Needs: Not on file  Physical Activity: Not on file  Stress: Not on file  Social Connections: Not on file   Past Surgical History:  Procedure Laterality Date   Assumption  05/03/2021   heart   c section  01/1968   KNEE SURGERY Right    meniscus   SHOULDER SURGERY Right 08/2014   Past Medical History:  Diagnosis Date   A-fib [facility])    Aortic insufficiency    Hypertension    Stroke ([facility])    Ht 5\' 6"  (1.676 m)   Wt 177 lb (80.3 kg)   BMI 28.57 kg/m   Opioid Risk Score:   Fall Risk Score:  `1  Depression screen [facility] 2/9     07/11/2022    1:06 PM 04/11/2022   12:08 PM 02/19/2022    2:01 PM 12/04/2021    1:22 PM  Depression screen PHQ 2/9  Decreased Interest 0 0 0 0  Down, Depressed, Hopeless 0 0 0 0  PHQ - 2 Score 0 0 0 0  Altered sleeping    0  Tired, decreased energy    0  Change in appetite    0  Feeling bad or failure about yourself     0  Trouble concentrating    0  Moving slowly or fidgety/restless     0  Suicidal thoughts    0  PHQ-9 Score    0      Review of Systems  Musculoskeletal:  Positive for gait problem.       Hand and Buttock Pain   All other systems reviewed and are negative.     Objective:   Physical Exam Vitals and nursing note reviewed.  Constitutional:      Appearance: She is normal weight.  HENT:     Head: Normocephalic and atraumatic.  Eyes:     General: No visual field deficit.    Extraocular Movements: Extraocular movements intact.     Pupils: Pupils are  equal, round, and reactive to light.  Musculoskeletal:     Right lower leg: No edema.     Left lower leg: No edema.     Comments: There is no pain with shoulder range of motion bilaterally there is mildly decreased shoulder flexion as well as abduction on the left side compared to the right side. There is no evidence of hand edema on the left side no joint motion shoulder elbow wrist or fingers. No pain with left knee range of motion No evidence of left knee effusion.  Skin:    General: Skin is warm and dry.  Neurological:     Mental Status: She is alert and oriented to person, place, and time.     Cranial Nerves: No dysarthria or facial asymmetry.     Sensory: Sensory deficit present.     Motor: Abnormal muscle tone present.     Coordination: Coordination abnormal. Finger-Nose-Finger Test abnormal. Impaired rapid alternating movements.     Gait: Gait abnormal.     Comments: Motor strength is 5/5 in the right deltoid, bicep, tricep, grip, hip flexor, knee extensor, ankle dorsiflexor and plantar flexor Left upper extremity 3 - at the deltoid, 4/5 at the bicep, 4 - at the tricep flexors and extensors.  Thumb flexor to minus thumb extensor to minus Tone increased in the left FL MAS 2/3 Sensation absent to light touch in the left hand and arm as well as left leg.  Some proprioception in the left hand. Facial sensation present on the left. Ambulates with a left AFO as well as a rolling walker no evidence of  toe drag or knee instability locked knee gait on left Positive sensory ataxia in the upper extremity as well as local.  Psychiatric:        Mood and Affect: Mood normal.        Behavior: Behavior normal.           Assessment & Plan:   1.  History of right thalamic infarct with residual severe left hemisensory deficits and mild hemiparesis.  She has made excellent functional progress.  Her daughter is looking at going back to work and I do think the patient should be able to manage at home during the day.  Discussed with patient as well as her daughter regarding the need for a more detailed vision evaluation prior to resuming driving.  They will contact their eye doctors office.  Recommend visual field testing. Will make referral to Occupational Therapy to work on scapular control incorporating both land-based and aquatic exercise. Continue PT. Physical medicine and rehab follow-up in 3 months

## 2022-07-15 ENCOUNTER — Ambulatory Visit: Payer: Medicare Other | Attending: Internal Medicine | Admitting: Physical Therapy

## 2022-07-15 ENCOUNTER — Encounter: Payer: Self-pay | Admitting: Physical Therapy

## 2022-07-15 DIAGNOSIS — M6281 Muscle weakness (generalized): Secondary | ICD-10-CM | POA: Insufficient documentation

## 2022-07-15 DIAGNOSIS — I69154 Hemiplegia and 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 left non-dominant side: Secondary | ICD-10-CM | POA: Diagnosis present

## 2022-07-15 DIAGNOSIS — R2681 Unsteadiness on feet: Secondary | ICD-10-CM | POA: Insufficient documentation

## 2022-07-15 DIAGNOSIS — R2689 Other abnormalities of gait and mobility: Secondary | ICD-10-CM | POA: Insufficient documentation

## 2022-07-15 NOTE — Therapy (Signed)
OUTPATIENT PHYSICAL THERAPY NEURO TREATMENT NOTE/AQUATIC THERAPY        Patient Name: Charlotte Hunter MRN: [id number] DOB:04-Feb-1956, 67 y.o., female Today's Date: 07/15/2022   PCP: Su Grand, MD REFERRING PROVIDER: Bayard Hugger, NP     PT End of Session - 07/15/22 2000     Visit Number 51    Number of Visits 66   renewal completed for 16 additional visits   Date for PT Re-Evaluation 07/26/22    Authorization Type Medicare    Authorization Time Period 01-03-22 - 04-03-22;  04-01-22 - 06-13-22; 06-03-22 - 08-13-22    Progress Note Due on Visit 26    PT Start Time 1405    PT Stop Time 1510    PT Time Calculation (min) 65 min    Equipment Utilized During Treatment Other (comment)   bar bells, aquatic cuff   Activity Tolerance Patient tolerated treatment well    Behavior During Therapy [facility] for tasks assessed/performed                                    Past Medical History:  Diagnosis Date   A-fib    Aortic insufficiency    Hypertension    Stroke    Past Surgical History:  Procedure Laterality Date   ABDOMINAL HYSTERECTOMY     ABLATION  05/03/2021   heart   c section  01/1968   KNEE SURGERY Right    meniscus   SHOULDER SURGERY Right 08/2014   Patient Active Problem List   Diagnosis Date Noted   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   ICH (intracerebral hemorrhage) 10/01/2021   Paroxysmal atrial fibrillation 09/29/2021   Stenosis of intracranial vessel 09/29/2021   Essential hypertension 09/29/2021   Blurry vision 09/29/2021   GERD (gastroesophageal reflux disease) 09/29/2021   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 09/25/2021    ONSET DATE: 09-25-21  REFERRING DIAG: I61.9 (ICD-10-CM) - Hemorrhagic stroke ([place])   THERAPY DIAG:  Other abnormalities of gait and mobility  Unsteadiness on feet  Hemiplegia and 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 left non-dominant  side  Rationale for Evaluation and Treatment Rehabilitation  SUBJECTIVE:                                                                                                                                                                                              SUBJECTIVE STATEMENT: Pt reports she walked up and down her steps at home for first time this past weekend with [name] just standing by - states she  walked on gravel and grass some and then walked up              And down her ramp a few times with her walker with [name] holding walker.  Pt reports she saw Dr. Letta Pate last Thursday - "pleaded for a renewal" due to how well she is now doing Pt accompanied by: [name], [name]  PERTINENT HISTORY: 67 year old female here for evaluation of right thalamic intracerebral hemorrhage. Patient is was admitted to the hospital in June 2023 for new onset left-sided weakness headache and nausea. She was found to have intracerebral hemorrhage in the right thalamus. Patient was on Eliquis anticoagulation for atrial fibrillation at the time and this was reversed medically. She was started on hypertonic saline, blood pressure control in ICU admission. Patient went to inpatient rehabilitation. Now patient back home. Still has left-sided weakness and left hand and arm and leg pain. Has been to cardiology who recommended to hold anticoagulation for now as patient is not in atrial fibrillation.   Past medical history of atrial fibrillation on Eliquis aortic insufficiency, hypertension, fibromyalgia  PAIN:  Are you having pain? Yes Location: Lt side of face & chin, edge of Lt arm (lateral side), foot and ankle and Lt buttock  Intensity: 1-2/10  Nerve pain, occasional shock sensation, "rope-like sensation";   04-09-22: not so much in pain but more numbness in Lt side of face - states numbness is moving out    PRECAUTIONS: Fall  WEIGHT BEARING RESTRICTIONS No  FALLS: Has patient fallen in last 6 months?  No  LIVING ENVIRONMENT: Lives with: daughter, [name] Lives in: House/apartment Stairs: No - Ramp has been built at front entrance Has following equipment at home: Programmer, multimedia, Environmental consultant - 2 wheeled, and bed side commode  PLOF: Independent; pt did gardening prior to CVA - was not employed prior to onset of CVA  PATIENT GOALS "be restored back to where I was"   OBJECTIVE:    Aquatic therapy at [place] - pool temp 90 degrees  Patient seen for aquatic therapy today.  Treatment took place in water 3.5-4.5 feet deep depending on activity.  Pt entered and exited the pool via step negotiation with use of bil. Hand rails with CGA to min assist using a step by step sequence  Pt performed water walking 18' x 4 reps with no device and then use of bar bells for reciprocal UE movement during water walking; pt performed sideways amb. 18' x 4 reps holding onto PT's forearms for UE support  Squats bil. LE's 10 reps;  LLE unilateral squats with min assist on pool edge 15 reps    Pt performed stepping with RLE  for improved LLE SLS and closed chain strengthening - pt held Lt knee in slight flexion - stepped forward/back, out/in and back/forward with RLE 10 reps each direction   Pt performed tap ups to aquatic step with RLE for improved LLE SLS and closed chain strengthening 10 reps   Pt performed tall kneeling on aquatic step - 10 mini squats; performed Lt hip extension/flexion in tall kneeling 10 reps; performed Lt hip abduction/adduction 10 reps in tall kneeling  Pt performed Lt hip flexion, extension, abduction with aquatic cuff 10 reps each direction - in standing:  Lt hip abduction/adduction with knee flexed at 90 degrees 10 reps  Performed Lt knee extension/flexion in standing position with aquatic cuff - 10 reps with pt actively flexing Lt knee to 90 degrees  Pt performed LUE AROM -  horizontal abduction/adduction with cues to keep Lt elbow extended; shoulder flexion/extension 10  reps  Pt requires buoyancy of water for support for joint unloading and for reduced fall risk with gait training without device than can be safely performed on land. Water current produces perturbations for challenge for static & dynamic standing balance.  Buoyancy of water provides unweighting which assists with pain reduction.     TherEx:  Access Code: [email or web address] - issued on 01-17-22 URL: [email or web address] Date: 01/17/2022 Prepared by: Ethelene Browns  Exercises - Seated Soleus Stretch  - 2-3 x daily - 7 x weekly - 1 sets - 2 reps - 15-20 secs  hold - Slant Board Gastrocnemius Stretch  - 3 x daily - 7 x weekly - 1 sets - 2 reps - 15-20 secs hold - Seated Gastroc Stretch with Strap  - 2-3 x daily - 7 x weekly - 1 sets - 2 reps - 15-20 hold   PATIENT EDUCATION:   Education details: continue to practice gait with RW at home despite ongoing anxiety and fear of falling Person educated: Patient Education method: Explanation, Demonstration Education comprehension: verbalized understanding, demonstration   HOME EXERCISE PROGRAM:  Previously issued Access Code: [email or web address] URL: [email or web address] Date: 01/08/2022 Prepared by: Ethelene Browns  Exercises - Bridge  - 1 x daily - 7 x weekly - 1 sets - 10 reps - 5 hold - Single Leg Bridge  - 1 x daily - 7 x weekly - 1 sets - 10 reps - 3 sec hold - Hooklying Clamshell with Resistance  - 1 x daily - 7 x weekly - 1 sets - 10 reps - 2-3 sec hold - Supine Hip Flexion with Resistance Loop  - 1 x daily - 7 x weekly - 3 sets - 10 reps - Supine Heel Slide  - 1 x daily - 7 x weekly - 3 sets - 10 reps - Stepping out/in with LEFT leg  - 1 x daily - 7 x weekly - 3 sets - 10 reps - Hooklying Hamstring Stretch with Strap  - 1 x daily - 7 x weekly - 1 sets - 1-2 reps - 20 sec hold  GOALS: Goals reviewed with patient? Yes     NEW SHORT TERM GOALS:  TARGET DATE 06-28-22   Perform a squat pivot transfer toward Rt side with  CGA. Baseline: pt performing stand pivot transfers Goal status: GOAL MET 07-08-22 at pool  2.  Amb. 250' with RW with SBA on flat, even surface to demo improved endurance.  Baseline:  52' with RW   Goal status:  NEW  3. Pt will amb. From lobby into/out of clinic consistently with RW with SBA.  Baseline:  ambulating inconsistently with RW in/out of clinic depending on fatigue/pain, etc. ; more consistent as of 3/14  Goal status:  ONGOING  4.  Improve Berg score to >/= 38/56 to demo improved balance.  Baseline:  16/56 on 01-18-22; 31/56 on 02-28-22;  33/56 on 04-01-22, 37/56 (3/14)  Goal status: ONGOING-IN PROGRESS  5.  Perform step negotiation with use of Rt hand rail with SBA (4 steps) using step by step sequence.  Baseline:  min assist  Goal status:  Ongoing/REVISED    6.  Improve TUG score to </= 48 secs with RW to demo improved functional mobility. Baseline:  59 sec with RW mod I (2/21), 65 sec with RW mod I (3/14) Goal status:  ONGOING  7.  Pt will transfer floor to stand with UE support on  object with min assist.  Baseline:  to be assessed, max A x 2 to transfer floor to mat table  Goal status:  NOT MET-GOAL D/C AS UNSAFE TO CONTINUE TO ATTEMPT     NEW LONG TERM GOALS:  TARGET DATE:  07-26-22  Pt will report ambulating up/down ramp at her home modified independently with RW in order to access/exit home alone.  Baseline: pt reports she has walked up the ramp with SBA from daughter but has not walked down the ramp as of this time due to fear  Goal status:  NEW  2.  Pt will increase Berg balance test score to >/= 41/56 to reduce fall risk.  Baseline:  33/56 on 04-01-22; Berg score 34/56 on 05-08-54 Goal status: IN PROGRESS/ONGOING  3.  Amb. 350' with RW with SBA for increased community accessibility.  Baseline:  Goal status: UPDATED 06-10-22  4.  Negotiate 4 steps with use of handrail with supervision using step by step sequence. Baseline: min A with handrail x 4 steps  (2/21) Goal status: UPDATED  5.  Pt will improve TUG score to </= 43 secs with RW to demo improved functional mobility and reduced fall risk.  Baseline: 2 min with SBQC:  59 sec with RW mod I (2/21) Goal status: GOAL MET  6.  Pt will negotiate ramp and curb with RW with SBA for increased community accessibility.  Baseline: ramp and curb with RW and min A due to fear of falling (2/21) Goal status: ONGOING    7.  Pt will transfer from garden bench to standing with SBA with use of RW to enable pt to return to leisure activity of planting flowers.   Baseline:  To be assessed   Goal status:  NEW  ASSESSMENT:  CLINICAL IMPRESSION: Aquatic PT session focused on LLE strengthening exercises and water walking in various directions.  Pt demonstrated increased strength of Lt hamstrings with pt able to actively flex Lt knee in standing to 90 degrees with use of aquatic cuff for increased resistance.  Pt continues to wear AFO on LLE I pool which maintains Lt ankle in neutral position, enabling increased weight shift and weight bearing in stance phase of gait.  Pt now able to amb. In 4' water depth without use of UE support on any flotation device.  Continue POC.    OBJECTIVE IMPAIRMENTS Abnormal gait, decreased activity tolerance, decreased balance, decreased coordination, decreased endurance, decreased knowledge of use of DME, decreased ROM, decreased strength, impaired sensation, impaired tone, and impaired UE functional use.   ACTIVITY LIMITATIONS carrying, lifting, bending, standing, squatting, stairs, transfers, bed mobility, and locomotion level  PARTICIPATION LIMITATIONS: meal prep, cleaning, laundry, driving, shopping, community activity, and yard work  [place] and severity of deficits  are also affecting patient's functional outcome.   REHAB POTENTIAL: Good  CLINICAL DECISION MAKING: Evolving/moderate complexity  EVALUATION COMPLEXITY: Moderate  PLAN: PT FREQUENCY:  2x/week for 8 weeks  PT DURATION: 8 weeks  PLANNED INTERVENTIONS: Therapeutic exercises, Therapeutic activity, Neuromuscular re-education, Balance training, Gait training, Patient/Family education, Self Care, Stair training, Orthotic/Fit training, DME instructions, Aquatic Therapy, and Wheelchair mobility training  PLAN FOR NEXT SESSION:  work on walking on grass, practice getting RW/LBQC in/out car (per pt's request): Cont with gait training with RW/LBQC; balance exercises/LLE SLS, strengthening exercises   Hortensia Duffin, [place], [place] [address] [place], [place] [address] 07/15/2022, 8:56 PM

## 2022-07-17 ENCOUNTER — Ambulatory Visit: Payer: Medicare Other | Admitting: Physical Therapy

## 2022-07-17 DIAGNOSIS — R2689 Other abnormalities of gait and mobility: Secondary | ICD-10-CM

## 2022-07-17 DIAGNOSIS — M6281 Muscle weakness (generalized): Secondary | ICD-10-CM

## 2022-07-17 DIAGNOSIS — R2681 Unsteadiness on feet: Secondary | ICD-10-CM

## 2022-07-17 NOTE — Therapy (Addendum)
OUTPATIENT PHYSICAL THERAPY NEURO TREATMENT NOTE        Patient Name: Charlotte Hunter MRN: [id number] DOB:06-29-1955, 67 y.o., female Today's Date: 07/17/2022   PCP: Su Grand, MD REFERRING PROVIDER: Bayard Hugger, NP     PT End of Session - 07/17/22 1203     Visit Number 52    Number of Visits 25   renewal completed for 16 additional visits   Date for PT Re-Evaluation 07/26/22    Authorization Type Medicare    Authorization Time Period 01-03-22 - 04-03-22;  04-01-22 - 06-13-22; 06-03-22 - 08-13-22    Progress Note Due on Visit 74    PT Start Time 1200   pt arrived late   PT Stop Time 1238    PT Time Calculation (min) 38 min    Equipment Utilized During Treatment Gait belt   L AFO   Activity Tolerance Patient tolerated treatment well    Behavior During Therapy [facility] for tasks assessed/performed                                     Past Medical History:  Diagnosis Date   A-fib    Aortic insufficiency    Hypertension    Stroke    Past Surgical History:  Procedure Laterality Date   ABDOMINAL HYSTERECTOMY     ABLATION  05/03/2021   heart   c section  01/1968   KNEE SURGERY Right    meniscus   SHOULDER SURGERY Right 08/2014   Patient Active Problem List   Diagnosis Date Noted   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   ICH (intracerebral hemorrhage) 10/01/2021   Paroxysmal atrial fibrillation 09/29/2021   Stenosis of intracranial vessel 09/29/2021   Essential hypertension 09/29/2021   Blurry vision 09/29/2021   GERD (gastroesophageal reflux disease) 09/29/2021   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 09/25/2021    ONSET DATE: 09-25-21  REFERRING DIAG: I61.9 (ICD-10-CM) - Hemorrhagic stroke ([place])   THERAPY DIAG:  Other abnormalities of gait and mobility  Unsteadiness on feet  Muscle weakness (generalized)  Rationale for Evaluation and Treatment Rehabilitation  SUBJECTIVE:                                                                                                                                                                                               SUBJECTIVE STATEMENT: Pt reports she has been walking on gravel outdoors, across her grass, up/down ramp with her RW and was able to navigate down her stairs in her backyard, had her daughter  [name] with her. Pt asking about bigger wheels or bigger balls on her walker to be able to navigate uneven ground easier, has to lift up RW on gravel at home.   Pt accompanied by: Gillermo Murdoch  PERTINENT HISTORY: 67 year old female here for evaluation of right thalamic intracerebral hemorrhage. Patient is was admitted to the hospital in June 2023 for new onset left-sided weakness headache and nausea. She was found to have intracerebral hemorrhage in the right thalamus. Patient was on Eliquis anticoagulation for atrial fibrillation at the time and this was reversed medically. She was started on hypertonic saline, blood pressure control in ICU admission. Patient went to inpatient rehabilitation. Now patient back home. Still has left-sided weakness and left hand and arm and leg pain. Has been to cardiology who recommended to hold anticoagulation for now as patient is not in atrial fibrillation.   Past medical history of atrial fibrillation on Eliquis aortic insufficiency, hypertension, fibromyalgia  PAIN:  Are you having pain? Yes Location: Lt side of face & chin, edge of Lt arm (lateral side), foot and ankle and Lt buttock  Intensity: 1-2/10  Nerve pain, occasional shock sensation, "rope-like sensation";   04-09-22: not so much in pain but more numbness in Lt side of face - states numbness is moving out    PRECAUTIONS: Fall  WEIGHT BEARING RESTRICTIONS No  FALLS: Has patient fallen in last 6 months? No  LIVING ENVIRONMENT: Lives with: daughter, [name] Lives in: House/apartment Stairs: No - Ramp has been built at front entrance Has following equipment at  home: Programmer, multimedia, Environmental consultant - 2 wheeled, and bed side commode  PLOF: Independent; pt did gardening prior to CVA - was not employed prior to onset of CVA  PATIENT GOALS "be restored back to where I was"   OBJECTIVE:    Gait: Trial gait with rollator in clinic as pt asking about a device with larger wheels in order to be able to better navigate uneven ground outdoors. Pt unsafe with rollator indoors across uneven surface due to impaired balance and decreased control of device. Utilized pt's RW to ambulate outdoors to the grass. Once in the grass pt able to ambulate x 10 ft with rollator with CGA for balance, able to safely navigate across grass with rollator brakes locked. Pt able to able to turn around to sit on rollator and return to standing with CGA overall for balance, decreased safety/stability noted when turning around to face rollator again. Attempted to navigate across gravel outdoors with rollator, rollator does not roll well over gravel. Transitioned to navigating across soft moss on the ground with rollator, pt requires brakes to be unlocked in order to advance device and needs assist lifting device up/down small curbs outdoors. Pt would currently be unsafe to use a rollator by herself outdoors, can benefit from continued practice with device but current recommendations would be that she continues to use her RW.    TherEx:  Access Code: [email or web address] - issued on 01-17-22 URL: [email or web address] Date: 01/17/2022 Prepared by: Ethelene Browns  Exercises - Seated Soleus Stretch  - 2-3 x daily - 7 x weekly - 1 sets - 2 reps - 15-20 secs  hold - Slant Board Gastrocnemius Stretch  - 3 x daily - 7 x weekly - 1 sets - 2 reps - 15-20 secs hold - Seated Gastroc Stretch with Strap  - 2-3 x daily - 7 x weekly - 1 sets - 2 reps - 15-20 hold   PATIENT EDUCATION:  Education details: continue to practice gait with RW at home, PT POC Person educated: Patient Education method:  Explanation, Demonstration Education comprehension: verbalized understanding, demonstration   HOME EXERCISE PROGRAM:  Previously issued Access Code: [email or web address] URL: [email or web address] Date: 01/08/2022 Prepared by: Ethelene Browns  Exercises - Bridge  - 1 x daily - 7 x weekly - 1 sets - 10 reps - 5 hold - Single Leg Bridge  - 1 x daily - 7 x weekly - 1 sets - 10 reps - 3 sec hold - Hooklying Clamshell with Resistance  - 1 x daily - 7 x weekly - 1 sets - 10 reps - 2-3 sec hold - Supine Hip Flexion with Resistance Loop  - 1 x daily - 7 x weekly - 3 sets - 10 reps - Supine Heel Slide  - 1 x daily - 7 x weekly - 3 sets - 10 reps - Stepping out/in with LEFT leg  - 1 x daily - 7 x weekly - 3 sets - 10 reps - Hooklying Hamstring Stretch with Strap  - 1 x daily - 7 x weekly - 1 sets - 1-2 reps - 20 sec hold  GOALS: Goals reviewed with patient? Yes     NEW SHORT TERM GOALS:  TARGET DATE 06-28-22   Perform a squat pivot transfer toward Rt side with CGA. Baseline: pt performing stand pivot transfers Goal status: GOAL MET 07-08-22 at pool  2.  Amb. 250' with RW with SBA on flat, even surface to demo improved endurance.  Baseline:  74' with RW   Goal status:  NEW  3. Pt will amb. From lobby into/out of clinic consistently with RW with SBA.  Baseline:  ambulating inconsistently with RW in/out of clinic depending on fatigue/pain, etc. ; more consistent as of 3/14  Goal status:  ONGOING  4.  Improve Berg score to >/= 38/56 to demo improved balance.  Baseline:  16/56 on 01-18-22; 31/56 on 02-28-22;  33/56 on 04-01-22, 37/56 (3/14)  Goal status: ONGOING-IN PROGRESS  5.  Perform step negotiation with use of Rt hand rail with SBA (4 steps) using step by step sequence.  Baseline:  min assist  Goal status:  Ongoing/REVISED    6.  Improve TUG score to </= 48 secs with RW to demo improved functional mobility. Baseline:  59 sec with RW mod I (2/21), 65 sec with RW mod I (3/14) Goal  status:  ONGOING  7.  Pt will transfer floor to stand with UE support on object with min assist.  Baseline:  to be assessed, max A x 2 to transfer floor to mat table  Goal status:  NOT MET-GOAL D/C AS UNSAFE TO CONTINUE TO ATTEMPT     NEW LONG TERM GOALS:  TARGET DATE:  07-26-22  Pt will report ambulating up/down ramp at her home modified independently with RW in order to access/exit home alone.  Baseline: pt reports she has walked up the ramp with SBA from daughter but has not walked down the ramp as of this time due to fear  Goal status:  NEW  2.  Pt will increase Berg balance test score to >/= 41/56 to reduce fall risk.  Baseline:  33/56 on 04-01-22; Berg score 34/56 on 05-08-54 Goal status: IN PROGRESS/ONGOING  3.  Amb. 350' with RW with SBA for increased community accessibility.  Baseline:  Goal status: UPDATED 06-10-22  4.  Negotiate 4 steps with use of handrail with supervision using step by step sequence. Baseline: min  A with handrail x 4 steps (2/21) Goal status: UPDATED  5.  Pt will improve TUG score to </= 43 secs with RW to demo improved functional mobility and reduced fall risk.  Baseline: 2 min with SBQC:  59 sec with RW mod I (2/21) Goal status: GOAL MET  6.  Pt will negotiate ramp and curb with RW with SBA for increased community accessibility.  Baseline: ramp and curb with RW and min A due to fear of falling (2/21) Goal status: ONGOING    7.  Pt will transfer from garden bench to standing with SBA with use of RW to enable pt to return to leisure activity of planting flowers.   Baseline:  To be assessed   Goal status:  NEW  ASSESSMENT:  CLINICAL IMPRESSION: Session limited by patient's late arrival. Emphasis of skilled PT session on trialing a rollator both indoors and outdoors to determine if pt would be safe to utilize this device to better access various terrains in her yard. Pt is unsafe using a rollator across level ground indoors due to impaired balance  and decreased control of device, exhibits improved control with use outdoors across thick grass. However, pt would not be safe to turn and sit on device independently and does struggle with management of brakes due to L hand weakness and decreased grip strength. Pt can benefit from continued practice of gait outdoors with rollator as well as benefits from skilled therapy services to continue to work towards [place]. Continue POC.   OBJECTIVE IMPAIRMENTS Abnormal gait, decreased activity tolerance, decreased balance, decreased coordination, decreased endurance, decreased knowledge of use of DME, decreased ROM, decreased strength, impaired sensation, impaired tone, and impaired UE functional use.   ACTIVITY LIMITATIONS carrying, lifting, bending, standing, squatting, stairs, transfers, bed mobility, and locomotion level  PARTICIPATION LIMITATIONS: meal prep, cleaning, laundry, driving, shopping, community activity, and yard work  [place] and severity of deficits  are also affecting patient's functional outcome.   REHAB POTENTIAL: Good  CLINICAL DECISION MAKING: Evolving/moderate complexity  EVALUATION COMPLEXITY: Moderate  PLAN: PT FREQUENCY: 2x/week for 8 weeks  PT DURATION: 8 weeks  PLANNED INTERVENTIONS: Therapeutic exercises, Therapeutic activity, Neuromuscular re-education, Balance training, Gait training, Patient/Family education, Self Care, Stair training, Orthotic/Fit training, DME instructions, Aquatic Therapy, and Wheelchair mobility training  PLAN FOR NEXT SESSION:  work on walking on grass, practice getting RW/LBQC in/out car (per pt's request): Cont with gait training with RW/LBQC; balance exercises/LLE SLS, strengthening exercises, gait with rollator outdoors   Excell Seltzer, PT, DPT, [place] [address] [place], [place] [address] 07/17/2022, 1:11 PM

## 2022-07-22 ENCOUNTER — Encounter: Payer: Self-pay | Admitting: Physical Therapy

## 2022-07-22 ENCOUNTER — Ambulatory Visit: Payer: Self-pay | Admitting: Physical Therapy

## 2022-07-22 DIAGNOSIS — I69154 Hemiplegia and 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 left non-dominant side: Secondary | ICD-10-CM

## 2022-07-22 DIAGNOSIS — R2689 Other abnormalities of gait and mobility: Secondary | ICD-10-CM | POA: Diagnosis not present

## 2022-07-22 DIAGNOSIS — R2681 Unsteadiness on feet: Secondary | ICD-10-CM

## 2022-07-22 NOTE — Therapy (Signed)
OUTPATIENT PHYSICAL THERAPY NEURO TREATMENT NOTE/AQUATIC THERAPY        Patient Name: Shelby Nila MRN: [id number] DOB:[birth year], 67 y.o., female Today's Date: 07/22/2022   PCP: Audery Amel, MD REFERRING PROVIDER: Jones Bales, NP     PT End of Session - 07/22/22 1946     Visit Number 53    Number of Visits 54   renewal completed for 16 additional visits   Date for PT Re-Evaluation 07/26/22    Authorization Type Medicare    Authorization Time Period 01-03-22 - 04-03-22;  04-01-22 - 06-13-22; 06-03-22 - 08-13-22    Progress Note Due on Visit 50    PT Start Time 1400    PT Stop Time 1500    PT Time Calculation (min) 60 min    Equipment Utilized During Treatment Other (comment)   bar bells, aquatic step   Activity Tolerance Patient tolerated treatment well    Behavior During Therapy WFL for tasks assessed/performed                                    Past Medical History:  Diagnosis Date   A-fib    Aortic insufficiency    Hypertension    Stroke    Past Surgical History:  Procedure Laterality Date   ABDOMINAL HYSTERECTOMY     ABLATION  05/03/2021   heart   c section  01/1968   KNEE SURGERY Right    meniscus   SHOULDER SURGERY Right 08/2014   Patient Active Problem List   Diagnosis Date Noted   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   ICH (intracerebral hemorrhage) 10/01/2021   Paroxysmal atrial fibrillation 09/29/2021   Stenosis of intracranial vessel 09/29/2021   Essential hypertension 09/29/2021   Blurry vision 09/29/2021   GERD (gastroesophageal reflux disease) 09/29/2021   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 09/25/2021    ONSET DATE: 09-25-21  REFERRING DIAG: I61.9 (ICD-10-CM) - Hemorrhagic stroke ([name])   THERAPY DIAG:  Other abnormalities of gait and mobility  Unsteadiness on feet  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 Patient is was admitted to the hospital in June 2023 for new onset left-sided weakness headache and nausea. She was found to have intracerebral hemorrhage in the right thalamus. Patient was on Eliquis anticoagulation for atrial fibrillation at the time and this was reversed medically. She was started on hypertonic saline, blood pressure control in ICU admission. Patient went to inpatient rehabilitation. Now patient back home. Still has left-sided weakness and left hand and arm and leg pain. Has been to cardiology who recommended to hold anticoagulation for now as patient is not in atrial fibrillation.   Past medical history of atrial fibrillation on Eliquis aortic insufficiency, hypertension, fibromyalgia  PAIN:  Are you having pain? Yes Location: Lt side of face & chin, edge of Lt arm (lateral side), foot and ankle and Lt buttock  Intensity: 1-2/10  Nerve pain, occasional shock sensation, "rope-like sensation";   04-09-22: not so much in pain but more numbness in Lt side of face - states numbness is moving out    PRECAUTIONS: Fall  WEIGHT BEARING RESTRICTIONS No  FALLS: Has patient fallen in last 6 months? No  LIVING ENVIRONMENT: Lives with: daughter, [name] Lives in: House/apartment Stairs: No - Ramp has been built at front entrance Has following  equipment at home: Chiropractor, Environmental consultant - 2 wheeled, and bed side commode  PLOF: Independent; pt did gardening prior to CVA - was not employed prior to onset of CVA  PATIENT GOALS "be restored back to where I was"   OBJECTIVE:    Aquatic therapy at [facility] - pool temp 90 degrees  Patient seen for aquatic therapy today.  Treatment took place in water 3.5-4.5 feet deep depending on activity.  Pt entered and exited the pool via step negotiation with use of bil. Hand rails with CGA  using a step by step sequence  Pt performed water walking 18' x 6 reps with no device with SBA only; pt performed sideways amb. 18' x 4 reps and backwards 18' x 2 reps with CGA to SBA  Squats bil. LE's 10 reps;  LLE unilateral squats with min assist on pool edge 15 reps   Pt performed stepping with RLE  for improved LLE SLS and closed chain strengthening - pt held Lt knee in slight flexion - stepped forward/back, out/in and back/forward with RLE 10 reps each direction  Marching in place 10 reps each leg with CGA   Pt performed tap ups to aquatic step with RLE for improved LLE SLS and closed chain strengthening 10 reps; step ups LLE onto aquatic step for quad strenghtening 10 reps   Pt performed Lt hip flexion, extension, abduction with aquatic cuff 10 reps each direction - in standing:  Lt hip abduction/adduction with knee flexed at 90 degrees 10 reps  Performed Lt knee extension/flexion in standing position with aquatic cuff - 10 reps with pt actively flexing Lt knee to 90 degrees  Pt performed LUE AROM - horizontal abduction/adduction with cues to keep Lt elbow extended with yellow bar bells; shoulder flexion/extension without closed fist for increased resistance of water with cues to try to keep Lt elbow extended  Pt requires buoyancy of water for support for joint unloading and for reduced fall risk with gait training without device than can be safely performed on land. Water current produces  perturbations for challenge for static & dynamic standing balance.  Buoyancy of water provides  unweighting which assists with pain reduction.     TherEx:  Access Code: [email or web address] - issued on 01-17-22 URL: [email or web address] Date: 01/17/2022 Prepared by: Maebelle MunroeLinda Miia Blanks  Exercises - Seated Soleus Stretch  - 2-3 x daily - 7 x weekly - 1 sets - 2 reps - 15-20 secs  hold - Slant Board Gastrocnemius Stretch  - 3 x daily - 7 x weekly - 1 sets - 2 reps - 15-20 secs hold - Seated Gastroc Stretch with Strap  - 2-3 x daily - 7 x weekly - 1 sets - 2 reps - 15-20 hold   PATIENT EDUCATION:   Education details: continue to practice gait with RW at home despite ongoing anxiety and fear of falling Person educated: Patient Education method: Explanation, Demonstration Education comprehension: verbalized understanding, demonstration   HOME EXERCISE PROGRAM:  Previously issued Access Code: [email or web address] URL: [email or web address] Date: 01/08/2022 Prepared by: Maebelle MunroeLinda Hanna Aultman  Exercises - Bridge  - 1 x daily - 7 x weekly - 1 sets - 10 reps - 5 hold - Single Leg Bridge  - 1 x daily - 7 x weekly - 1 sets - 10 reps - 3 sec hold - Hooklying Clamshell with Resistance  - 1 x daily - 7 x weekly - 1 sets - 10 reps - 2-3 sec hold - Supine Hip Flexion with Resistance Loop  - 1 x daily - 7 x weekly - 3 sets - 10 reps - Supine Heel Slide  - 1 x daily - 7 x weekly - 3 sets - 10 reps - Stepping out/in with LEFT leg  - 1 x daily - 7 x weekly - 3 sets - 10 reps - Hooklying Hamstring Stretch with Strap  - 1 x daily - 7 x weekly - 1 sets - 1-2 reps - 20 sec hold  GOALS: Goals reviewed with patient? Yes     NEW SHORT TERM GOALS:  TARGET DATE 06-28-22   Perform a squat pivot transfer toward Rt side with CGA. Baseline: pt performing stand pivot transfers Goal status: GOAL MET 07-08-22 at pool  2.  Amb. 250' with RW with SBA on flat, even surface to demo improved endurance.  Baseline:  79[address]' with  RW   Goal status:  NEW  3. Pt will amb. From lobby into/out of clinic consistently with RW with SBA.  Baseline:  ambulating inconsistently with RW in/out of clinic depending on fatigue/pain, etc. ; more consistent as of 3/14  Goal status:  ONGOING  4.  Improve Berg score to >/= 38/56 to demo improved balance.  Baseline:  16/56 on 01-18-22; 31/56 on 02-28-22;  33/56 on 04-01-22, 37/56 (3/14)  Goal status: ONGOING-IN PROGRESS  5.  Perform step negotiation with use of Rt hand rail with SBA (4 steps) using step by step sequence.  Baseline:  min assist  Goal status:  Ongoing/REVISED    6.  Improve TUG score to </= 48 secs with RW to demo improved functional mobility. Baseline:  59 sec with RW mod I (2/21), 65 sec with RW mod I (3/14) Goal status:  ONGOING  7.  Pt will transfer floor to stand with UE support on object with min assist.  Baseline:  to be assessed, max A x 2 to transfer floor to mat table  Goal status:  NOT MET-GOAL D/C AS UNSAFE TO CONTINUE TO ATTEMPT     NEW LONG TERM GOALS:  TARGET DATE:  07-26-22  Pt will report ambulating up/down ramp at her home modified independently with  RW in order to access/exit home alone.  Baseline: pt reports she has walked up the ramp with SBA from daughter but has not walked down the ramp as of this time due to fear  Goal status:  NEW  2.  Pt will increase Berg balance test score to >/= 41/56 to reduce fall risk.  Baseline:  33/56 on 04-01-22; Berg score 34/56 on 05-08-54 Goal status: IN PROGRESS/ONGOING  3.  Amb. 350' with RW with SBA for increased community accessibility.  Baseline:  Goal status: UPDATED 06-10-22  4.  Negotiate 4 steps with use of handrail with supervision using step by step sequence. Baseline: min A with handrail x 4 steps (2/21) Goal status: UPDATED  5.  Pt will improve TUG score to </= 43 secs with RW to demo improved functional mobility and reduced fall risk.  Baseline: 2 min with SBQC:  59 sec with RW mod I  (2/21) Goal status: GOAL MET  6.  Pt will negotiate ramp and curb with RW with SBA for increased community accessibility.  Baseline: ramp and curb with RW and min A due to fear of falling (2/21) Goal status: ONGOING    7.  Pt will transfer from garden bench to standing with SBA with use of RW to enable pt to return to leisure activity of planting flowers.   Baseline:  To be assessed   Goal status:  NEW  ASSESSMENT:  CLINICAL IMPRESSION: Aquatic PT session focused on LLE strengthening exercises and gait training in 4' water depth without use of any device for assist with floatation or balance.  Pt performed forwards water walking with SBA only in today's session for first time, demonstrating improved dynamic standing balance in aquatic environment.  Pt reported several occurrences of pain in midline in today's session but was able to rest for several seconds in standing and continue with aquatic exercises.   Continue POC.    OBJECTIVE IMPAIRMENTS Abnormal gait, decreased activity tolerance, decreased balance, decreased coordination, decreased endurance, decreased knowledge of use of DME, decreased ROM, decreased strength, impaired sensation, impaired tone, and impaired UE functional use.   ACTIVITY LIMITATIONS carrying, lifting, bending, standing, squatting, stairs, transfers, bed mobility, and locomotion level  PARTICIPATION LIMITATIONS: meal prep, cleaning, laundry, driving, shopping, community activity, and yard work  PERSONAL FACTORS Fitness and severity of deficits  are also affecting patient's functional outcome.   REHAB POTENTIAL: Good  CLINICAL DECISION MAKING: Evolving/moderate complexity  EVALUATION COMPLEXITY: Moderate  PLAN: PT FREQUENCY: 2x/week for 8 weeks  PT DURATION: 8 weeks  PLANNED INTERVENTIONS: Therapeutic exercises, Therapeutic activity, Neuromuscular re-education, Balance training, Gait training, Patient/Family education, Self Care, Stair training,  Orthotic/Fit training, DME instructions, Aquatic Therapy, and Wheelchair mobility training  PLAN FOR NEXT SESSION:  Check LTG's - I will do renewal -- work on walking on grass, practice getting RW/LBQC in/out car (per pt's request): Cont with gait training with RW/LBQC; balance exercises/LLE SLS, strengthening exercises   Josip Merolla, Donavan Burnet, PT  [facility] [address] [place], [place] [address] 07/22/2022, 7:49 PM

## 2022-07-24 ENCOUNTER — Ambulatory Visit: Payer: Medicare Other | Admitting: Physical Therapy

## 2022-07-24 DIAGNOSIS — R2689 Other abnormalities of gait and mobility: Secondary | ICD-10-CM

## 2022-07-24 DIAGNOSIS — R2681 Unsteadiness on feet: Secondary | ICD-10-CM

## 2022-07-24 DIAGNOSIS — M6281 Muscle weakness (generalized): Secondary | ICD-10-CM

## 2022-07-24 NOTE — Therapy (Signed)
OUTPATIENT PHYSICAL THERAPY NEURO TREATMENT NOTE        Patient Name: Charlotte Hunter MRN: [id number] DOB:[birth year], 67 y.o., female Today's Date: 07/24/2022   PCP: Audery Amel, MD REFERRING PROVIDER: Jones Bales, NP     PT End of Session - 07/24/22 1419     Visit Number 54    Number of Visits 54   renewal completed for 16 additional visits   Date for PT Re-Evaluation 07/26/22    Authorization Type Medicare    Authorization Time Period 01-03-22 - 04-03-22;  04-01-22 - 06-13-22; 06-03-22 - 08-13-22    Progress Note Due on Visit 50    PT Start Time 1405   pt arrived late   PT Stop Time 1445    PT Time Calculation (min) 40 min    Equipment Utilized During Treatment Gait belt    Activity Tolerance Patient tolerated treatment well    Behavior During Therapy WFL for tasks assessed/performed                                      Past Medical History:  Diagnosis Date   A-fib    Aortic insufficiency    Hypertension    Stroke    Past Surgical History:  Procedure Laterality Date   ABDOMINAL HYSTERECTOMY     ABLATION  05/03/2021   heart   c section  01/1968   KNEE SURGERY Right    meniscus   SHOULDER SURGERY Right 08/2014   Patient Active Problem List   Diagnosis Date Noted   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   ICH (intracerebral hemorrhage) 10/01/2021   Paroxysmal atrial fibrillation 09/29/2021   Stenosis of intracranial vessel 09/29/2021   Essential hypertension 09/29/2021   Blurry vision 09/29/2021   GERD (gastroesophageal reflux disease) 09/29/2021   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 09/25/2021    ONSET DATE: 09-25-21  REFERRING DIAG: I61.9 (ICD-10-CM) - Hemorrhagic stroke ([name])   THERAPY DIAG:  Unsteadiness on feet  Other abnormalities of gait and mobility  Muscle weakness (generalized)  Rationale for Evaluation and Treatment Rehabilitation  SUBJECTIVE:                                                                                                                                                                                               SUBJECTIVE STATEMENT: Pt shows video of herself walking in the pool during aquatic therapy with no device/no UE support, pt very encouraged by this and wondering if she will be able walk unassisted on land  in the future. Pt reports feeling kind of "foggy" today from her gabapentin.  Pt accompanied by: Quincy CarnesFriend Charlotte Hunter  PERTINENT HISTORY: [age] year old female here for evaluation of right thalamic intracerebral hemorrhage. Patient is was admitted to the hospital in June 2023 for new onset left-sided weakness headache and nausea. She was found to have intracerebral hemorrhage in the right thalamus. Patient was on Eliquis anticoagulation for atrial fibrillation at the time and this was reversed medically. She was started on hypertonic saline, blood pressure control in ICU admission. Patient went to inpatient rehabilitation. Now patient back home. Still has left-sided weakness and left hand and arm and leg pain. Has been to cardiology who recommended to hold anticoagulation for now as patient is not in atrial fibrillation.   Past medical history of atrial fibrillation on Eliquis aortic insufficiency, hypertension, fibromyalgia  PAIN:  Are you having pain? Yes Location: Lt side of face & chin, edge of Lt arm (lateral side), foot and ankle and Lt buttock  Intensity: 1-2/10  Nerve pain, occasional shock sensation, "rope-like sensation";   04-09-22: not so much in pain but more numbness in Lt side of face - states numbness is moving out    PRECAUTIONS: Fall  WEIGHT BEARING RESTRICTIONS No  FALLS: Has patient fallen in last 6 months? No  LIVING ENVIRONMENT: Lives with: daughter, Charlotte Hunter Lives in: House/apartment Stairs: No - Ramp has been built at front entrance Has following equipment at home: ChiropractorQuad cane large base, Environmental consultantWalker - 2 wheeled, and bed side  commode  PLOF: Independent; pt did gardening prior to CVA - was not employed prior to onset of CVA  PATIENT GOALS "be restored back to where I was"   OBJECTIVE:    Gait: Gait pattern: decreased arm swing- Left, decreased hip/knee flexion- Left, and genu recurvatum- Left Distance walked: 50 ft Assistive device utilized:  min HHA from therapist Level of assistance: Min A Comments: gait with therapist UE support for short distances per pt request  TherAct:  [facility] - 07/24/22 1422       Standardized Balance Assessment   Standardized Balance Assessment Timed Up and Go Test;Berg Balance Test      Berg Balance Test   Sit to Stand Able to stand  independently using hands    Standing Unsupported Able to stand 2 minutes with supervision    Sitting with Back Unsupported but Feet Supported on Floor or Stool Able to sit safely and securely 2 minutes    Stand to Sit Controls descent by using hands    Transfers Able to transfer safely, definite need of hands    Standing Unsupported with Eyes Closed Able to stand 10 seconds with supervision    Standing Unsupported with Feet Together Able to place feet together independently and stand for 1 minute with supervision    From Standing, Reach Forward with Outstretched Arm Can reach confidently >25 cm (10")    From Standing Position, Pick up Object from Floor Able to pick up shoe, needs supervision    From Standing Position, Turn to Look Behind Over each Shoulder Looks behind one side only/other side shows less weight shift    Turn 360 Degrees Needs close supervision or verbal cueing    Standing Unsupported, Alternately Place Feet on Step/Stool Needs assistance to keep from falling or unable to try    Standing Unsupported, One Foot in Front Able to take small step independently and hold 30 seconds    Standing on One Leg Tries to lift leg/unable  to hold 3 seconds but remains standing independently    Total Score 36    Berg comment: 36/56,  high fall risk      Timed Up and Go Test   TUG Normal TUG    Normal TUG (seconds) 49   with RW             PATIENT EDUCATION:   Education details: continue to practice gait with RW at home, PT POC Person educated: Patient Education method: Programmer, multimedia, Demonstration Education comprehension: verbalized understanding, demonstration   HOME EXERCISE PROGRAM:  Previously issued Access Code: [email or web address] URL: [email or web address] Date: 01/08/2022 Prepared by: Maebelle Munroe  Exercises - Bridge  - 1 x daily - 7 x weekly - 1 sets - 10 reps - 5 hold - Single Leg Bridge  - 1 x daily - 7 x weekly - 1 sets - 10 reps - 3 sec hold - Hooklying Clamshell with Resistance  - 1 x daily - 7 x weekly - 1 sets - 10 reps - 2-3 sec hold - Supine Hip Flexion with Resistance Loop  - 1 x daily - 7 x weekly - 3 sets - 10 reps - Supine Heel Slide  - 1 x daily - 7 x weekly - 3 sets - 10 reps - Stepping out/in with LEFT leg  - 1 x daily - 7 x weekly - 3 sets - 10 reps - Hooklying Hamstring Stretch with Strap  - 1 x daily - 7 x weekly - 1 sets - 1-2 reps - 20 sec hold   TherEx:  Access Code: [email or web address] - issued on 01-17-22 URL: [email or web address] Date: 01/17/2022 Prepared by: Maebelle Munroe  Exercises - Seated Soleus Stretch  - 2-3 x daily - 7 x weekly - 1 sets - 2 reps - 15-20 secs  hold - Slant Board Gastrocnemius Stretch  - 3 x daily - 7 x weekly - 1 sets - 2 reps - 15-20 secs hold - Seated Gastroc Stretch with Strap  - 2-3 x daily - 7 x weekly - 1 sets - 2 reps - 15-20 hold  GOALS: Goals reviewed with patient? Yes     NEW SHORT TERM GOALS:  TARGET DATE 06-28-22   Perform a squat pivot transfer toward Rt side with CGA. Baseline: pt performing stand pivot transfers Goal status: GOAL MET 07-08-22 at pool  2.  Amb. 250' with RW with SBA on flat, even surface to demo improved endurance.  Baseline:  70' with RW   Goal status:  NEW  3. Pt will amb. From lobby into/out  of clinic consistently with RW with SBA.  Baseline:  ambulating inconsistently with RW in/out of clinic depending on fatigue/pain, etc. ; more consistent as of 3/14  Goal status:  ONGOING  4.  Improve Berg score to >/= 38/56 to demo improved balance.  Baseline:  16/56 on 01-18-22; 31/56 on 02-28-22;  33/56 on 04-01-22, 37/56 (3/14)  Goal status: ONGOING-IN PROGRESS  5.  Perform step negotiation with use of Rt hand rail with SBA (4 steps) using step by step sequence.  Baseline:  min assist  Goal status:  Ongoing/REVISED    6.  Improve TUG score to </= 48 secs with RW to demo improved functional mobility. Baseline:  59 sec with RW mod I (2/21), 65 sec with RW mod I (3/14) Goal status:  ONGOING  7.  Pt will transfer floor to stand with UE support on object with min assist.  Baseline:  to be assessed, max A  x 2 to transfer floor to mat table  Goal status:  NOT MET-GOAL D/C AS UNSAFE TO CONTINUE TO ATTEMPT     NEW LONG TERM GOALS:  TARGET DATE:  07-26-22  Pt will report ambulating up/down ramp at her home modified independently with RW in order to access/exit home alone.  Baseline: pt reports she has walked up the ramp with SBA from daughter but has not walked down the ramp as of this time due to fear  Goal status:  IN PROGRESS/ONGOING  2.  Pt will increase Berg balance test score to >/= 41/56 to reduce fall risk.  Baseline:  33/56 on 04-01-22; Berg score 34/56 on 05-08-54; 36/56 (4/10) Goal status: IN PROGRESS/ONGOING  3.  Amb. 350' with RW with SBA for increased community accessibility.  Baseline:  Goal status: UPDATED 06-10-22  4.  Negotiate 4 steps with use of handrail with supervision using step by step sequence. Baseline: min A with handrail x 4 steps (2/21), CGA with handrail x 4 steps (3/27) Goal status: IN PROGRESS/ONGOING  5.  Pt will improve TUG score to </= 43 secs with RW to demo improved functional mobility and reduced fall risk.  Baseline: 2 min with SBQC:  59 sec with  RW mod I (2/21), 49 sec with RW mod I (4/10) Goal status: IN PROGRESS/ONGOING  6.  Pt will negotiate ramp and curb with RW with SBA for increased community accessibility.  Baseline: ramp and curb with RW and min A due to fear of falling (2/21), CGA with RW (3/27) Goal status: IN PROGRESS/ONGOING    7.  Pt will transfer from garden bench to standing with SBA with use of RW to enable pt to return to leisure activity of planting flowers.   Baseline: unable to safely attempt, max A to stand to RW from slightly higher surface so not recommending pt perform this transfer at this time (4/10)   Goal status:  NOT MET - GOAL D/C  ASSESSMENT:  CLINICAL IMPRESSION: Emphasis of skilled PT session on assessing LTG and trialing gait with min HHA from therapist per pt request. Pt has made progress towards 5/6 STG assessed this date with one goal being d/c as it is unsafe for pt to continue to attempt. Pt improved her BBS score to 36/56 and improved her TUG score to 49 sec, though she did not quite meet goals set for these outcome measures. Additionally, pt continues to work on ascending/descending her ramp at home with assist from family and/or friends as well as gait across her uneven gravel and grass with her RW. Discussed that pt is not safe to perform transfers on/off rollator at this time and therapy continues to recommend that pt use her RW outdoors and either place her w/c or a sturdy chair in the yard to sit in for yard work. Reviewed plan of care with patient and that she will only have 4 more lands visits before d/c so to focus on what she really wants to work on. Pt interested in continuing to work on gait with [facility] and stairs. Continue POC.   OBJECTIVE IMPAIRMENTS Abnormal gait, decreased activity tolerance, decreased balance, decreased coordination, decreased endurance, decreased knowledge of use of DME, decreased ROM, decreased strength, impaired sensation, impaired tone, and impaired UE functional  use.   ACTIVITY LIMITATIONS carrying, lifting, bending, standing, squatting, stairs, transfers, bed mobility, and locomotion level  PARTICIPATION LIMITATIONS: meal prep, cleaning, laundry, driving, shopping, community activity, and yard work  PERSONAL FACTORS Fitness and severity  of deficits  are also affecting patient's functional outcome.   REHAB POTENTIAL: Good  CLINICAL DECISION MAKING: Evolving/moderate complexity  EVALUATION COMPLEXITY: Moderate  PLAN: PT FREQUENCY: 2x/week for 8 weeks  PT DURATION: 8 weeks  PLANNED INTERVENTIONS: Therapeutic exercises, Therapeutic activity, Neuromuscular re-education, Balance training, Gait training, Patient/Family education, Self Care, Stair training, Orthotic/Fit training, DME instructions, Aquatic Therapy, and Wheelchair mobility training  PLAN FOR NEXT SESSION:  work on walking on grass, practice getting RW/LBQC in/out car (per pt's request): Cont with gait training with RW/LBQC; balance exercises/LLE SLS, strengthening exercises, gait with SBQC, stairs   Peter Congo, PT, DPT, CSRS  [facility] [address] [place], [place] [address] 07/24/2022, 4:02 PM

## 2022-07-29 ENCOUNTER — Ambulatory Visit: Payer: Self-pay | Admitting: Physical Therapy

## 2022-07-29 ENCOUNTER — Encounter: Payer: Self-pay | Admitting: Physical Therapy

## 2022-07-29 DIAGNOSIS — R2689 Other abnormalities of gait and mobility: Secondary | ICD-10-CM

## 2022-07-29 DIAGNOSIS — I69154 Hemiplegia and 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 left non-dominant side: Secondary | ICD-10-CM

## 2022-07-29 DIAGNOSIS — R2681 Unsteadiness on feet: Secondary | ICD-10-CM

## 2022-07-29 NOTE — Therapy (Signed)
OUTPATIENT PHYSICAL THERAPY NEURO TREATMENT NOTE/AQUATIC THERAPY        Patient Name: Charlotte Hunter MRN: [id number] DOB:[birth year], 67 y.o., female Today's Date: 07/30/2022   PCP: Audery Amel, MD REFERRING PROVIDER: Jones Bales, NP     PT End of Session - 07/29/22 1910     Visit Number 55    Number of Visits 62   renewal completed for 8 additional visits   Date for PT Re-Evaluation 09/06/22    Authorization Type Medicare    Authorization Time Period 01-03-22 - 04-03-22;  04-01-22 - 06-13-22; 06-03-22 - 08-13-22; 07-29-22 - 09-20-22    Progress Note Due on Visit 50    PT Start Time 1450    PT Stop Time 1535    PT Time Calculation (min) 45 min    Equipment Utilized During Treatment Other (comment)   aquatic cuff   Activity Tolerance Patient tolerated treatment well    Behavior During Therapy WFL for tasks assessed/performed                                    Past Medical History:  Diagnosis Date   A-fib    Aortic insufficiency    Hypertension    Stroke    Past Surgical History:  Procedure Laterality Date   ABDOMINAL HYSTERECTOMY     ABLATION  05/03/2021   heart   c section  01/1968   KNEE SURGERY Right    meniscus   SHOULDER SURGERY Right 08/2014   Patient Active Problem List   Diagnosis Date Noted   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   ICH (intracerebral hemorrhage) 10/01/2021   Paroxysmal atrial fibrillation 09/29/2021   Stenosis of intracranial vessel 09/29/2021   Essential hypertension 09/29/2021   Blurry vision 09/29/2021   GERD (gastroesophageal reflux disease) 09/29/2021   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 09/25/2021    ONSET DATE: 09-25-21  REFERRING DIAG: I61.9 (ICD-10-CM) - Hemorrhagic stroke ([name])   THERAPY DIAG:  Other abnormalities of gait and mobility - Plan: PT plan of care cert/re-cert  Unsteadiness on feet - Plan: PT plan of care cert/re-cert  Hemiplegia and hemiparesis following  nontraumatic intracerebral hemorrhage affecting left non-dominant side - Plan: PT plan of care cert/re-cert  Rationale for Evaluation and Treatment Rehabilitation  SUBJECTIVE:  Pt says she has found a rollator that she thinks she could outside to work in her flowers - shows a picture of an "all terrain rollator" on [employer] and says "I want to know what you think;  pt was informed again that I do not recommend use of a rollator outside on uneven terrain due to balance deficits and decreased sensation and functional use in LUE, impacting ability to use brakes on rollator for control and stability  Pt accompanied by: [name], [name]  PERTINENT HISTORY: 66 year old female here for evaluation of right thalamic intracerebral hemorrhage. Patient is was admitted to the hospital in June 2023 for new onset left-sided weakness headache and nausea. She was found to have intracerebral hemorrhage in the right thalamus. Patient was on Eliquis anticoagulation for atrial fibrillation at the time and this was reversed medically. She was started on hypertonic saline, blood pressure control in ICU admission. Patient went to inpatient rehabilitation. Now patient back home. Still has left-sided weakness and left hand and arm and leg pain. Has been to cardiology who recommended to hold anticoagulation for now as patient is not in atrial fibrillation.   Past medical history of atrial fibrillation on Eliquis aortic insufficiency, hypertension, fibromyalgia  PAIN:  Are you having pain? Yes Location: Lt side of face & chin, edge of Lt arm (lateral side), foot and ankle and Lt buttock  Intensity: 1-2/10  Nerve pain, occasional shock sensation, "rope-like sensation";   04-09-22: not so much in pain but more numbness in Lt side of face - states numbness is moving  out    PRECAUTIONS: Fall  WEIGHT BEARING RESTRICTIONS No  FALLS: Has patient fallen in last 6 months? No  LIVING ENVIRONMENT: Lives with: daughter, [name] Lives in: House/apartment Stairs: No - Ramp has been built at front entrance Has following equipment at home: Chiropractor, Environmental consultant - 2 wheeled, and bed side commode  PLOF: Independent; pt did gardening prior to CVA - was not employed prior to onset of CVA  PATIENT GOALS "be restored back to where I was"   OBJECTIVE:    Aquatic therapy at [facility] - pool temp 90 degrees  Patient seen for aquatic therapy today.  Treatment took place in water 3.5-4.5 feet deep depending on activity.  Pt entered and exited the pool via step negotiation with use of bil. Hand rails with CGA  using a step by step sequence  Pt performed water walking 18' x 6 reps with no device with SBA only; pt performed sideways amb. 18' x 4 reps and backwards 18' x 4 reps with CGA    Pt performed stepping with RLE  for improved LLE SLS and closed chain strengthening - pt held Lt knee in slight flexion - stepped forward/back, out/in and back/forward with RLE 10 reps each direction  Marching in place 10 reps each leg with CGA  Pt performed Lt hip flexion, extension, abduction with aquatic cuff 10 reps each direction - in standing:  Lt hip abduction/adduction with knee flexed at 90 degrees 10 reps  Performed Lt knee extension/flexion in seated position - no aquatic cuff used - 10 reps with pt actively flexing Lt knee to 90 degrees  Pt requires buoyancy of water for support for joint unloading and for reduced fall risk with gait training without device than can be safely performed on land. Water current produces perturbations for challenge for static & dynamic standing balance.  Buoyancy of water provides unweighting which assists with pain reduction.     TherEx:  Access Code: [email or web address] - issued on 01-17-22 URL: [email or web address] Date:  01/17/2022 Prepared by: Maebelle Munroe  Exercises - Seated Soleus Stretch  - 2-3 x daily - 7 x weekly - 1 sets - 2 reps - 15-20 secs  hold - Slant Board Gastrocnemius Stretch  - 3 x daily - 7 x weekly - 1 sets - 2 reps - 15-20 secs hold - Seated Gastroc Stretch with Strap  - 2-3 x daily -  7 x weekly - 1 sets - 2 reps - 15-20 hold   PATIENT EDUCATION:   Education details: continue to practice gait with RW at home despite ongoing anxiety and fear of falling Person educated: Patient Education method: Explanation, Demonstration Education comprehension: verbalized understanding, demonstration   HOME EXERCISE PROGRAM:  Previously issued Access Code: [email or web address] URL: [email or web address] Date: 01/08/2022 Prepared by: Maebelle Munroe  Exercises - Bridge  - 1 x daily - 7 x weekly - 1 sets - 10 reps - 5 hold - Single Leg Bridge  - 1 x daily - 7 x weekly - 1 sets - 10 reps - 3 sec hold - Hooklying Clamshell with Resistance  - 1 x daily - 7 x weekly - 1 sets - 10 reps - 2-3 sec hold - Supine Hip Flexion with Resistance Loop  - 1 x daily - 7 x weekly - 3 sets - 10 reps - Supine Heel Slide  - 1 x daily - 7 x weekly - 3 sets - 10 reps - Stepping out/in with LEFT leg  - 1 x daily - 7 x weekly - 3 sets - 10 reps - Hooklying Hamstring Stretch with Strap  - 1 x daily - 7 x weekly - 1 sets - 1-2 reps - 20 sec hold  GOALS: Goals reviewed with patient? Yes     NEW SHORT TERM GOALS:  TARGET DATE 08-23-22   Pt will amb. 12' with [facility] with SBA for increased household ambulation with this device.  Baseline;  pt reports using RW for assistance with household amb.   Goal status:  NEW  2.  Amb. 250' with RW with SBA on flat, even surface to demo improved endurance.  Baseline:  85' with RW   Goal status:  Ongoing  3. Pt will increase Berg balance test score to >/= 41/56 to reduce fall risk.  Baseline:  33/56 on 04-01-22; Berg score 34/56 on 05-08-54; 36/56 (4/10) Goal status: IN  PROGRESS/ONGOING  4.  Pt will improve TUG score to </= 43 secs with RW to demo improved functional mobility and reduced fall risk.  Baseline: 2 min with SBQC:  59 sec with RW mod I (2/21), 49 sec with RW mod I (4/10) Goal status: IN PROGRESS/ONGOING  5.  Pt will be able to pick up object off floor from standing position without UE support with SBA to demo improved balance with bending/leaning forward and returning to upright position.    Baseline:  unable to pick up object off floor without UE support            Goal status:  NEW    6.  Pt will report ability to ambulate up and down ramp with RW at her home modified independently.   Baseline:  currently able to walk up ramp but not down the ramp Goal status:  NEW  7.  Pt will be able to kneel on Kay bench with min assist and return to standing.            Baseline:  TBA            Goal status:  NEW      NEW LONG TERM GOALS:  TARGET DATE: 09-13-22  Pt will report negotiating steps at her home with use of handrail modified independently.  Baseline:  has negotiated steps with SBA from daughter Goal status:  NEW  2.   Independent in aquatic HEP to be continued upon discharge from PT.  Baseline:  in progress Goal status:  NEW      ASSESSMENT:  CLINICAL IMPRESSION: Aquatic PT session focused on LLE strengthening exercises and gait training in 4' water depth without UE support on any floatation device.  Pt able to flex Lt knee to 90 degrees in seated position without any increased resistance other than buoyancy of water.  Pt is progressing well towards goals.   Continue POC.    OBJECTIVE IMPAIRMENTS Abnormal gait, decreased activity tolerance, decreased balance, decreased coordination, decreased endurance, decreased knowledge of use of DME, decreased ROM, decreased strength, impaired sensation, impaired tone, and impaired UE functional use.   ACTIVITY LIMITATIONS carrying, lifting, bending, standing, squatting, stairs,  transfers, bed mobility, and locomotion level  PARTICIPATION LIMITATIONS: meal prep, cleaning, laundry, driving, shopping, community activity, and yard work  PERSONAL FACTORS Fitness and severity of deficits  are also affecting patient's functional outcome.   REHAB POTENTIAL: Good  CLINICAL DECISION MAKING: Evolving/moderate complexity  EVALUATION COMPLEXITY: Moderate  PLAN: PT FREQUENCY: 2x/week for 6 weeks (4 land visits/4 aquatic PT visits during this 6 week time period)  PT DURATION:  6 weeks  PLANNED INTERVENTIONS: Therapeutic exercises, Therapeutic activity, Neuromuscular re-education, Balance training, Gait training, Patient/Family education, Self Care, Stair training, Orthotic/Fit training, DME instructions, Aquatic Therapy, and Wheelchair mobility training  PLAN FOR NEXT SESSION:  work on walking on grass, practice getting RW/LBQC in/out car (per pt's request): Cont with gait training with RW/LBQC; balance exercises/LLE SLS, strengthening exercises   Ezri Fanguy, Donavan Burnet, PT  [facility] [address] [place], [place] [address] 07/30/2022, 8:24 AM

## 2022-07-31 ENCOUNTER — Ambulatory Visit: Payer: Medicare Other | Admitting: Physical Therapy

## 2022-07-31 DIAGNOSIS — R2681 Unsteadiness on feet: Secondary | ICD-10-CM

## 2022-07-31 DIAGNOSIS — R2689 Other abnormalities of gait and mobility: Secondary | ICD-10-CM

## 2022-07-31 DIAGNOSIS — M6281 Muscle weakness (generalized): Secondary | ICD-10-CM

## 2022-07-31 NOTE — Therapy (Signed)
OUTPATIENT PHYSICAL THERAPY NEURO TREATMENT NOTE        Patient Name: Charlotte Hunter MRN: [id number] DOB:[birth year], 67 y.o., female Today's Date: 07/31/2022   PCP: Audery Amel, MD REFERRING PROVIDER: Jones Bales, NP     PT End of Session - 07/31/22 1339     Visit Number 56    Number of Visits 62   renewal completed for 8 additional visits   Date for PT Re-Evaluation 09/06/22    Authorization Type Medicare    Authorization Time Period 01-03-22 - 04-03-22;  04-01-22 - 06-13-22; 06-03-22 - 08-13-22; 07-29-22 - 09-20-22    Progress Note Due on Visit 50    PT Start Time 1325   pt arrived late   PT Stop Time 1410    PT Time Calculation (min) 45 min    Equipment Utilized During Treatment Gait belt    Activity Tolerance Patient tolerated treatment well    Behavior During Therapy [facility] for tasks assessed/performed                                     Past Medical History:  Diagnosis Date   A-fib    Aortic insufficiency    Hypertension    Stroke    Past Surgical History:  Procedure Laterality Date   ABDOMINAL HYSTERECTOMY     ABLATION  05/03/2021   heart   c section  01/1968   KNEE SURGERY Right    meniscus   SHOULDER SURGERY Right 08/2014   Patient Active Problem List   Diagnosis Date Noted   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   ICH (intracerebral hemorrhage) 10/01/2021   Paroxysmal atrial fibrillation 09/29/2021   Stenosis of intracranial vessel 09/29/2021   Essential hypertension 09/29/2021   Blurry vision 09/29/2021   GERD (gastroesophageal reflux disease) 09/29/2021   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 When pt enters clinic her LLE is turning inwards and she keeps walking towards the L side of her RW. Adjusted pt's L AFO in clinic waiting area before having her walk back to therapy gym. Pt also reports that she can retrieve objects from the floor in sitting with either UE, can retrieve objects from standing if using her RUE to brace on bedrail, etc.  Pt accompanied by: Bonnetta Barry  PERTINENT HISTORY: 67 year old female here for evaluation of right thalamic intracerebral hemorrhage. Patient is was admitted to the hospital in June 2023 for new onset left-sided weakness headache and nausea. She was found to have intracerebral hemorrhage in the right thalamus. Patient was on Eliquis anticoagulation for atrial fibrillation at the time and this was reversed medically. She was started on hypertonic saline, blood pressure control in ICU admission. Patient went to inpatient rehabilitation. Now patient back home. Still has left-sided weakness and left hand and arm and leg pain. Has been to cardiology who recommended to hold anticoagulation for now as patient is not in atrial fibrillation.   Past medical history of atrial fibrillation on Eliquis aortic insufficiency, hypertension, fibromyalgia  PAIN:  Are you having pain? Yes Location: Lt side of face & chin, edge of Lt arm (lateral side), foot and ankle and Lt buttock  Intensity: 1-2/10  Nerve pain, occasional shock sensation, "rope-like sensation";   04-09-22: not so much in pain but more numbness in Lt side of face - states numbness is moving out    PRECAUTIONS: Fall  WEIGHT BEARING RESTRICTIONS No  FALLS: Has patient fallen in last 6 months? No  LIVING ENVIRONMENT: Lives with: daughter, [name] Lives in:  House/apartment Stairs: No - Ramp has been built at front entrance Has following equipment at home: Chiropractor, Environmental consultant - 2 wheeled, and bed side commode  PLOF: Independent; pt did gardening prior to CVA - was not employed prior to onset of CVA  PATIENT GOALS "be restored back to where I was"   OBJECTIVE:    Orthotic Fit Assisted pt with doffing L shoe and L AFO while seated in clinic waiting area. Pt able to re-don her AFO and shoe, needs some assist with shoe horn in order to don shoe over AFO. Pt exhibits decreased IR of LLE during gait after adjustment but does continue to walk into L side of her RW, resisted to therapist attempts to reposition RW.  Gait   Gait pattern: decreased hip/knee flexion- Left, decreased ankle dorsiflexion- Left, circumduction- Left, and genu recurvatum- Left Distance walked: various clinic distances Assistive device utilized: Walker - 2 wheeled Level of assistance: SBA Comments: after adjustment of L AFO in patient's shoe, pt continues to walk into L side of her RW  Gait pattern: decreased hip/knee flexion- Left and genu recurvatum- Left Distance walked: 115 ft, 75 ft Assistive device utilized: Quad cane large base Level of assistance: CGA Comments: pt exhibits improved LLE control as compared to with use of RW this session; pt able to ambulate a full lap around therapy gym and ambulates back to waiting room/lobby at end of session     TherEx:  Access Code: [email or web address] - issued on 01-17-22 URL: [email or web address] Date: 01/17/2022 Prepared by: Maebelle Munroe  Exercises - Seated Soleus Stretch  - 2-3 x daily - 7 x weekly - 1 sets - 2 reps - 15-20 secs  hold - Slant Board Gastrocnemius Stretch  - 3 x daily - 7 x weekly - 1 sets - 2 reps - 15-20 secs hold - Seated Gastroc Stretch with Strap  - 2-3 x daily - 7 x weekly - 1 sets - 2 reps - 15-20 hold   PATIENT EDUCATION:   Education details: continue to practice gait with RW at  home, practice stairs at home with family/friends assist Education method: Explanation, Demonstration Education comprehension: verbalized understanding, demonstration   HOME EXERCISE PROGRAM:  Previously issued  Access Code: [email or web address] URL: [email or web address] Date: 01/08/2022 Prepared by: Maebelle Munroe  Exercises - Bridge  - 1 x daily - 7 x weekly - 1 sets - 10 reps - 5 hold - Single Leg Bridge  - 1 x daily - 7 x weekly - 1 sets - 10 reps - 3 sec hold - Hooklying Clamshell with Resistance  - 1 x daily - 7 x weekly - 1 sets - 10 reps - 2-3 sec hold - Supine Hip Flexion with Resistance Loop  - 1 x daily - 7 x weekly - 3 sets - 10 reps - Supine Heel Slide  - 1 x daily - 7 x weekly - 3 sets - 10 reps - Stepping out/in with LEFT leg  - 1 x daily - 7 x weekly - 3 sets - 10 reps - Hooklying Hamstring Stretch with Strap  - 1 x daily - 7 x weekly - 1 sets - 1-2 reps - 20 sec hold  GOALS: Goals reviewed with patient? Yes     NEW SHORT TERM GOALS:  TARGET DATE 08-23-22   Pt will amb. 75' with [facility] with SBA for increased household ambulation with this device.  Baseline;  pt reports using RW for assistance with household amb.   Goal status:  NEW  2.  Amb. 250' with RW with SBA on flat, even surface to demo improved endurance.  Baseline:  26' with RW   Goal status:  Ongoing  3. Pt will increase Berg balance test score to >/= 41/56 to reduce fall risk.  Baseline:  33/56 on 04-01-22; Berg score 34/56 on 05-08-54; 36/56 (4/10) Goal status: IN PROGRESS/ONGOING  4.  Pt will improve TUG score to </= 43 secs with RW to demo improved functional mobility and reduced fall risk.  Baseline: 2 min with SBQC:  59 sec with RW mod I (2/21), 49 sec with RW mod I (4/10) Goal status: IN PROGRESS/ONGOING  5.  Pt will be able to pick up object off floor from standing position without UE support with SBA to demo improved balance with bending/leaning forward and returning to upright position.     Baseline:  unable to pick up object off floor without UE support            Goal status:  NEW    6.  Pt will report ability to ambulate up and down ramp with RW at her home modified independently.   Baseline:  currently able to walk up ramp but not down the ramp Goal status:  NEW  7.  Pt will be able to kneel on Kay bench with min assist and return to standing.            Baseline:  TBA            Goal status:  NEW      NEW LONG TERM GOALS:  TARGET DATE: 09-13-22  Pt will report negotiating steps at her home with use of handrail modified independently.  Baseline:  has negotiated steps with SBA from daughter Goal status:  NEW  2.   Independent in aquatic HEP to be continued upon discharge from PT.            Baseline:  in progress Goal status:  NEW      ASSESSMENT:  CLINICAL IMPRESSION: Session limited by patient's late arrival and adjustment of AFO upon arrival. Emphasis of skilled PT session on working on gait with [facility] per pt's request to continue to progress with  this device. Pt does exhibit improved balance and improved LLE control with use of this device this session. Pt does require increased time to ambulate with use of LBQC due to some fear of falling and increased energy expenditure required. Pt continues to benefit from skilled therapy services to work towards LTGs. Continue POC.   OBJECTIVE IMPAIRMENTS Abnormal gait, decreased activity tolerance, decreased balance, decreased coordination, decreased endurance, decreased knowledge of use of DME, decreased ROM, decreased strength, impaired sensation, impaired tone, and impaired UE functional use.   ACTIVITY LIMITATIONS carrying, lifting, bending, standing, squatting, stairs, transfers, bed mobility, and locomotion level  PARTICIPATION LIMITATIONS: meal prep, cleaning, laundry, driving, shopping, community activity, and yard work  PERSONAL FACTORS Fitness and severity of deficits  are also affecting patient's  functional outcome.   REHAB POTENTIAL: Good  CLINICAL DECISION MAKING: Evolving/moderate complexity  EVALUATION COMPLEXITY: Moderate  PLAN: PT FREQUENCY: 2x/week for 6 weeks (4 land visits/4 aquatic PT visits during this 6 week time period)  PT DURATION:  6 weeks  PLANNED INTERVENTIONS: Therapeutic exercises, Therapeutic activity, Neuromuscular re-education, Balance training, Gait training, Patient/Family education, Self Care, Stair training, Orthotic/Fit training, DME instructions, Aquatic Therapy, and Wheelchair mobility training  PLAN FOR NEXT SESSION:  work on walking on grass, practice getting RW/LBQC in/out car (per pt's request): Cont with gait training with RW/LBQC; balance exercises/LLE SLS, strengthening exercises, being able to navigate stairs independently, pt also asking about sitting down on stairs and standing back up, retrieving an object from the floor   Peter Congo, PT, DPT, CSRS  [facility] [address] [place], [place] [address] 07/31/2022, 3:21 PM

## 2022-08-05 ENCOUNTER — Ambulatory Visit: Payer: Medicare Other | Admitting: Occupational Therapy

## 2022-08-07 ENCOUNTER — Ambulatory Visit: Payer: Medicare Other | Admitting: Physical Therapy

## 2022-08-07 VITALS — BP 154/75 | HR 91

## 2022-08-07 DIAGNOSIS — I69154 Hemiplegia and 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 left non-dominant side: Secondary | ICD-10-CM

## 2022-08-07 DIAGNOSIS — R2681 Unsteadiness on feet: Secondary | ICD-10-CM

## 2022-08-07 DIAGNOSIS — M6281 Muscle weakness (generalized): Secondary | ICD-10-CM

## 2022-08-07 DIAGNOSIS — R2689 Other abnormalities of gait and mobility: Secondary | ICD-10-CM | POA: Diagnosis not present

## 2022-08-07 NOTE — Therapy (Signed)
OUTPATIENT PHYSICAL THERAPY NEURO TREATMENT NOTE        Patient Name: Charlotte Hunter MRN: [id number] DOB:01-23-1956, 67 y.o., female Today's Date: 08/07/2022   PCP: Audery Amel, MD REFERRING PROVIDER: Jones Bales, NP     PT End of Session - 08/07/22 1409     Visit Number 57    Number of Visits 62   renewal completed for 8 additional visits   Date for PT Re-Evaluation 09/06/22    Authorization Type Medicare    Authorization Time Period 01-03-22 - 04-03-22;  04-01-22 - 06-13-22; 06-03-22 - 08-13-22; 07-29-22 - 09-20-22    Progress Note Due on Visit 50    PT Start Time 1400    PT Stop Time 1448    PT Time Calculation (min) 48 min    Equipment Utilized During Treatment Gait belt    Activity Tolerance Patient tolerated treatment well    Behavior During Therapy WFL for tasks assessed/performed                                      Past Medical History:  Diagnosis Date   A-fib    Aortic insufficiency    Hypertension    Stroke    Past Surgical History:  Procedure Laterality Date   ABDOMINAL HYSTERECTOMY     ABLATION  05/03/2021   heart   c section  01/1968   KNEE SURGERY Right    meniscus   SHOULDER SURGERY Right 08/2014   Patient Active Problem List   Diagnosis Date Noted   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   ICH (intracerebral hemorrhage) 10/01/2021   Paroxysmal atrial fibrillation 09/29/2021   Stenosis of intracranial vessel 09/29/2021   Essential hypertension 09/29/2021   Blurry vision 09/29/2021   GERD (gastroesophageal reflux disease) 09/29/2021   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 Pt reports that she has been going up/down stairs in her backyard and walking across grass and gravel with her RW. Pt able to walk down ramp with her RW. Pt did order a rollator to try at home and aware that therapy not recommending this device at this time. Pt states she had a near fall walking back down to bedroom with her RW, was able to catch herself and call her friend Clydie Braun to come assist her into her w/c.  Pt accompanied by: [name]  PERTINENT HISTORY: 67 year old female here for evaluation of right thalamic intracerebral hemorrhage. Patient is was admitted to the hospital in June 2023 for new onset left-sided weakness headache and nausea. She was found to have intracerebral hemorrhage in the right thalamus. Patient was on Eliquis anticoagulation for atrial fibrillation at the time and this was reversed medically. She was started on hypertonic saline, blood pressure control in ICU admission. Patient went to inpatient rehabilitation. Now patient back home. Still has left-sided weakness and left hand and arm and leg pain. Has been to cardiology who recommended to hold anticoagulation for now as patient is not in atrial fibrillation.   Past medical history of atrial fibrillation on Eliquis aortic insufficiency, hypertension, fibromyalgia  PAIN:  Are you having pain? Yes Location: Lt side of face & chin, edge of Lt arm (lateral side), foot and ankle and Lt buttock  Intensity: 1-2/10  Nerve pain, occasional shock sensation, "rope-like sensation";   04-09-22: not so much in pain but more numbness in Lt side of face - states numbness is moving out    PRECAUTIONS:  Fall  WEIGHT BEARING RESTRICTIONS No  FALLS: Has patient fallen in last 6 months? No  LIVING ENVIRONMENT: Lives with: daughter, [name] Lives in: House/apartment Stairs: No - Ramp has been built at front entrance Has following equipment at home: Chiropractor, Environmental consultant - 2 wheeled, and bed side commode  PLOF: Independent; pt did gardening prior to CVA - was not employed prior to onset of CVA  PATIENT GOALS "be restored back to where I was"   OBJECTIVE:     Gait :  STAIRS:  Level of Assistance: SBA  Stair Negotiation Technique: Step to Pattern with Single Rail on Right  Number of Stairs: 8   Height of Stairs: 6"  Comments: one standing rest break after navigating 4 stairs, difficulty flexing L knee during ascent  GAIT: Gait pattern: decreased hip/knee flexion- Left and circumduction- Left Distance walked: various clinic distances including in/out of appointment from the car Assistive device utilized: Environmental consultant - 2 wheeled Level of assistance: SBA Comments: pt noted to have decreased L knee flexion during gait, utilizes circumduction to compensate and hits left side of RW with LLE   Pt feels dizzy after stair navigation, seated BP assessed: Vitals:   08/07/22 1428  BP: (!) 154/75  Pulse: 91      TherEx:  Attempted seated heel slides, difficulty performing in this position even with towel under LE Supine heel slides 2 x 10 reps Seated and supine ankle stretch into eversion due to inversion of foot  Encouraged pt to keep working on heel slides and ankle eversion at home, pt declines a handout   Access Code: [email or web address] - issued on 01-17-22 URL: [email or web address] Date: 01/17/2022 Prepared by: Maebelle Munroe  Exercises - Seated Soleus Stretch  - 2-3 x daily - 7 x weekly - 1 sets - 2 reps - 15-20 secs  hold - Slant Board Gastrocnemius Stretch  - 3 x daily - 7 x weekly - 1 sets - 2 reps - 15-20 secs hold - Seated Gastroc Stretch with Strap  - 2-3 x  daily - 7 x weekly - 1 sets - 2 reps - 15-20 hold   PATIENT EDUCATION:   Education details: continue to practice gait with RW at home, practice stairs at home with family/friends assist, verbally added to HEP Education method: Explanation, Demonstration Education comprehension: verbalized understanding, demonstration   HOME EXERCISE PROGRAM:  Previously issued Access Code: [email or web address] URL: [email or web address] Date: 01/08/2022 Prepared by: Maebelle Munroe  Exercises - Bridge  - 1 x daily - 7 x weekly - 1 sets - 10 reps - 5 hold - Single Leg Bridge  - 1 x daily - 7 x weekly - 1 sets - 10 reps - 3 sec hold - Hooklying Clamshell with Resistance  - 1 x daily - 7 x weekly - 1 sets - 10 reps - 2-3 sec hold - Supine Hip Flexion with Resistance Loop  - 1 x daily - 7 x weekly - 3 sets - 10 reps - Supine Heel Slide  - 1 x daily - 7 x weekly - 3 sets - 10 reps - Stepping out/in with LEFT leg  - 1 x daily - 7 x weekly - 3 sets - 10 reps - Hooklying Hamstring Stretch with Strap  - 1 x daily - 7 x weekly - 1 sets - 1-2 reps - 20 sec hold  Verbally added L ankle eversion stretch  GOALS: Goals reviewed with patient? Yes     NEW SHORT TERM GOALS:  TARGET DATE 08-23-22   Pt will amb. 29' with [facility] with SBA for increased household ambulation with this device.  Baseline;  pt reports using RW for assistance with household amb.   Goal status:  NEW  2.  Amb. 250' with RW with SBA on flat, even surface to demo improved endurance.  Baseline:  41' with RW   Goal status:  Ongoing  3. Pt will increase Berg balance test score to >/= 41/56 to reduce fall risk.  Baseline:  33/56 on 04-01-22; Berg score 34/56 on 05-08-54; 36/56 (4/10) Goal status: IN PROGRESS/ONGOING  4.  Pt will improve TUG score to </= 43 secs with RW to demo improved functional mobility and reduced fall risk.  Baseline: 2 min with SBQC:  59 sec with RW mod I (2/21), 49 sec with RW mod I (4/10) Goal status: IN  PROGRESS/ONGOING  5.  Pt will be able to pick up object off floor from standing position without UE support with SBA to demo improved balance with bending/leaning forward and returning to upright position.    Baseline:  unable to pick up object off floor without UE support            Goal status:  NEW    6.  Pt will report ability to ambulate up and down ramp with RW at her home modified independently.   Baseline:  currently able to walk up ramp but not down the ramp Goal status:  NEW  7.  Pt will be able to kneel on Kay bench with min assist and return to standing.            Baseline:  TBA            Goal status:  NEW      NEW LONG TERM GOALS:  TARGET DATE: 09-13-22  Pt will report negotiating steps at her home with use of handrail modified independently.  Baseline:  has negotiated steps with SBA from daughter Goal status:  NEW  2.   Independent in aquatic HEP to be continued upon discharge from PT.            Baseline:  in progress Goal status:  NEW      ASSESSMENT:  CLINICAL IMPRESSION: Emphasis of skilled PT session on working on gait training on stairs as well as reviewing L HS strengthening exercises and ankle  stretches. Pt noted to have decreased L HS activation during gait and stair navigation this date, reviewed heel slides and encouraged her to perform these at home. Pt also noted to have her L ankle inverted at rest, encouraged her to perform eversion stretch. Pt continues to benefit from skilled therapy services to work towards LTGs. Continue POC.   OBJECTIVE IMPAIRMENTS Abnormal gait, decreased activity tolerance, decreased balance, decreased coordination, decreased endurance, decreased knowledge of use of DME, decreased ROM, decreased strength, impaired sensation, impaired tone, and impaired UE functional use.   ACTIVITY LIMITATIONS carrying, lifting, bending, standing, squatting, stairs, transfers, bed mobility, and locomotion level  PARTICIPATION LIMITATIONS:  meal prep, cleaning, laundry, driving, shopping, community activity, and yard work  PERSONAL FACTORS Fitness and severity of deficits  are also affecting patient's functional outcome.   REHAB POTENTIAL: Good  CLINICAL DECISION MAKING: Evolving/moderate complexity  EVALUATION COMPLEXITY: Moderate  PLAN: PT FREQUENCY: 2x/week for 6 weeks (4 land visits/4 aquatic PT visits during this 6 week time period)  PT DURATION:  6 weeks  PLANNED INTERVENTIONS: Therapeutic exercises, Therapeutic activity, Neuromuscular re-education, Balance training, Gait training, Patient/Family education, Self Care, Stair training, Orthotic/Fit training, DME instructions, Aquatic Therapy, and Wheelchair mobility training  PLAN FOR NEXT SESSION:  work on walking on grass, practice getting RW/LBQC in/out car (per pt's request): Cont with gait training with RW/LBQC; balance exercises/LLE SLS, strengthening exercises, being able to navigate stairs independently, pt also asking about sitting down on stairs and standing back up, retrieving an object from the floor, how is hamstring strength?   Peter Congo, PT, DPT, CSRS  [facility] [address] [place], [place] [address] 08/07/2022, 2:51 PM

## 2022-08-14 ENCOUNTER — Ambulatory Visit: Payer: Medicare Other | Admitting: Physical Therapy

## 2022-08-19 ENCOUNTER — Ambulatory Visit: Payer: Medicare Other | Admitting: Occupational Therapy

## 2022-08-19 ENCOUNTER — Ambulatory Visit: Payer: Medicare Other | Attending: Internal Medicine | Admitting: Physical Therapy

## 2022-08-19 ENCOUNTER — Encounter: Payer: Self-pay | Admitting: Occupational Therapy

## 2022-08-19 DIAGNOSIS — R2681 Unsteadiness on feet: Secondary | ICD-10-CM

## 2022-08-19 DIAGNOSIS — R208 Other disturbances of skin sensation: Secondary | ICD-10-CM

## 2022-08-19 DIAGNOSIS — R2689 Other abnormalities of gait and mobility: Secondary | ICD-10-CM | POA: Insufficient documentation

## 2022-08-19 DIAGNOSIS — I69154 Hemiplegia and 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 left non-dominant side: Secondary | ICD-10-CM | POA: Insufficient documentation

## 2022-08-19 DIAGNOSIS — I69318 Other symptoms and signs involving cognitive functions following cerebral infarction: Secondary | ICD-10-CM | POA: Diagnosis present

## 2022-08-19 DIAGNOSIS — R278 Other lack of coordination: Secondary | ICD-10-CM | POA: Insufficient documentation

## 2022-08-19 DIAGNOSIS — R29818 Other symptoms and signs involving the nervous system: Secondary | ICD-10-CM

## 2022-08-19 DIAGNOSIS — M6281 Muscle weakness (generalized): Secondary | ICD-10-CM | POA: Insufficient documentation

## 2022-08-19 NOTE — Therapy (Addendum)
OUTPATIENT OCCUPATIONAL THERAPY NEURO EVALUATION   Patient Name: Charlotte Hunter MRN: [id number] DOB:1955-05-19, 67 y.o., female Today's Date: 08/20/2022  PCP: Dr. Syble Creek REFERRING PROVIDER: Dr. Wynn Banker   END OF SESSION:  OT End of Session - 08/19/22 1418     Visit Number 1    Number of Visits 13    Date for OT Re-Evaluation 11/17/22    Authorization Type MCR [employer]    Authorization - Visit Number 1    Authorization - Number of Visits 10    Progress Note Due on Visit 10    OT Start Time 1233    OT Stop Time 1315    OT Time Calculation (min) 42 min    Activity Tolerance Patient tolerated treatment well    Behavior During Therapy WFL for tasks assessed/performed             Past Medical History:  Diagnosis Date   A-fib ([name])    Aortic insufficiency    Hypertension    Stroke [facility])    Past Surgical History:  Procedure Laterality Date   ABDOMINAL HYSTERECTOMY     ABLATION  05/03/2021   heart   c section  01/1968   KNEE SURGERY Right    meniscus   SHOULDER SURGERY Right 08/2014   Patient Active Problem List   Diagnosis Date Noted   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   ICH (intracerebral hemorrhage) ([name]) 10/01/2021   Paroxysmal atrial fibrillation ([name]) 09/29/2021   Stenosis of intracranial vessel 09/29/2021   Essential hypertension 09/29/2021   Blurry vision 09/29/2021   GERD (gastroesophageal reflux disease) 09/29/2021   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 09/25/2021    ONSET DATE: 07/11/22- referral date  REFERRING DIAG:  Diagnosis  I61.9 (ICD-10-CM) - Hemorrhagic stroke ([name])    THERAPY DIAG:  Muscle weakness (generalized) - Plan: Ot plan of care cert/re-cert  Hemiplegia and 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 left non-dominant side ([name]) - Plan: Ot plan of care cert/re-cert  Other lack of coordination - Plan: Ot plan of care cert/re-cert  Other symptoms and signs involving the nervous system  - Plan: Ot plan of care cert/re-cert  Other disturbances of skin sensation - Plan: Ot plan of care cert/re-cert  Other symptoms and signs involving cognitive functions following cerebral infarction - Plan: Ot plan of care cert/re-cert  Unsteadiness on feet - Plan: Ot plan of care cert/re-cert  Other abnormalities of gait and mobility - Plan: Ot plan of care cert/re-cert  Rationale for Evaluation and Treatment: Rehabilitation  SUBJECTIVE:   SUBJECTIVE STATEMENT: Pt wants to work in her garden Pt accompanied by: friend  PERTINENT HISTORY: 67 year old female here for evaluation of right thalamic intracerebral hemorrhage. Patient is was admitted to the hospital in June 2023 for new onset left-sided weakness headache and nausea. She was found to have intracerebral hemorrhage in the right thalamus. Patient was on Eliquis anticoagulation for atrial fibrillation at the time and this was reversed medically. She was started on hypertonic saline, blood pressure control in ICU admission. Patient went to inpatient rehabilitation. Now patient back home. Still has left-sided weakness and left hand and arm and leg pain. Has been to cardiology who recommended to hold anticoagulation for now as patient is not in atrial fibrillation.    Past medical history of atrial fibrillation on Eliquis aortic insufficiency, hypertension, fibromyalgia Pt received OT at [address] And was d/c in February PRECAUTIONS: Fall  WEIGHT BEARING RESTRICTIONS: No  PAIN:  Are you having pain? Yes: NPRS  scale: 5/10 Pain location: left hand  Pain description: sharp, burning Aggravating factors: use Relieving factors: meds, rest  FALLS: Has patient fallen in last 6 months? 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  PLOF: Independent  PATIENT GOALS: continue to increase functional use of LUE  OBJECTIVE:   HAND  DOMINANCE: Right  ADLs: Overall ADLs: Pt is performing basic ADLs mod I  Transfers/ambulation related to ADLs: Eating: mod I Grooming: mod I UB Dressing: mod I LB Dressing: mod I Toileting: mod I uses walker and w/c Bathing: mod I has tub bench Tub Shower transfers: mod I Equipment: Transfer tub bench and bed side commode  IADLs: Shopping: dtr performs the grocery shopping  Light housekeeping: pt vacuums seated in w/c  Meal Prep: uses oven mod I Community mobility: supervision with walker, alos has w/c Medication management: mod I Financial management: mod I   MOBILITY STATUS:  supervision to mod I with walker    ACTIVITY TOLERANCE: Activity tolerance: Pt fatigues quickly with ambulation    UPPER EXTREMITY ROM:  RUE WFLS, LUE pt has full composite flexion. Pt has hyperextension at PIP joint and flexion at DIP for ring finger  Active ROM Right eval Left eval  Shoulder flexion  80 with elbow flexed  Shoulder abduction    Shoulder adduction    Shoulder extension    Shoulder internal rotation    Shoulder external rotation    Elbow flexion  WFLS  Elbow extension  -15  Wrist flexion  WFLs  Wrist extension    Wrist ulnar deviation    Wrist radial deviation    Wrist pronation  WFLS  Wrist supination  WFLS  (Blank rows = not tested)  HAND FUNCTION: Grip strength: Right: 65 lbs; Left: 18 lbs  COORDINATION: 9 Hole Peg test: Right: NT sec; Left: 3 pegs placed in 2 mins sec  SENSATION: LUE  near absent sensation  EDEMA: n/a  MUSCLE TONE: LUE: Mild and Hypertonic  COGNITION: Overall cognitive status: Within functional limits for tasks assessed    OBSERVATIONS: Pt became fatigued when walking to [place] requiring a w/c for rest. Pt was assisted into her friends car and she was doing ok when she left.   TODAY'S TREATMENT:                                                                                                                              DATE: n/a eval  only   PATIENT EDUCATION: Education details: potential goals for OT Person educated: Patient and Caregiver   Education method: Explanation Education comprehension: verbalized understanding  HOME EXERCISE PROGRAM: N/a   GOALS: Goals reviewed with patient? No  SHORT TERM GOALS: Target date: 09/18/22  I with HEP  Goal status: INITIAL  2.  Pt will demonstrate improved LUE functional use as evidenced by increasing increase box and blocks  score by 3 blocks  Goal status: INITIAL  3.  Pt will verbalize understanding of AE/ DME options to increase safety and independence with ADLs/IADLs.  Goal status: INITIAL  4.  Pt will demonstrate LUE shoulder flexion to  85* with min compensation for improved functional reach.  Goal status: INITIAL  5.  Pt will utilize LUE as a gross assist for ADLs at least 50% of the time with pain no greater than 4/10.  Goal status: INITIAL    LONG TERM GOALS: Target date: 11/17/22  I with updated HEP.  Goal status: INITIAL  2.  Pt will report ability to perform simple garden task with AE/DME prn and supervision, following assist for set up.  Goal status: INITIAL  3.  Pt will demonstrate 90* shoulder flexion with min compensation for increased LUE functional use. Goal status: INITIAL  4.  Pt will demonstrate improved LUE functional use as evidenced by increasing increase box and blocks score by 6 blocks from eval  Goal status: INITIAL   ASSESSMENT:  CLINICAL IMPRESSION: Patient is a 67 y.o. female who was seen today for occupational therapy evaluation for I61.9 (ICD-10-CM) - Hemorrhagic stroke ([name]). Pt previously received occupational therapy at [address] [facility] re[facility]. She was d/c in February. She desires to continue to increase her functional use of LUE and safety and independence in order to maximize her safety and independence with daily activities.  PERFORMANCE DEFICITS: in functional skills including ADLs, IADLs, coordination, dexterity,  sensation, tone, ROM, strength, pain, flexibility, Fine motor control, Gross motor control, mobility, endurance, decreased knowledge of precautions, decreased knowledge of use of DME, and UE functional use, cognitive skills including safety awareness, and psychosocial skills including coping strategies, environmental adaptation, habits, interpersonal interactions, and routines and behaviors.   IMPAIRMENTS: are limiting patient from ADLs, IADLs, rest and sleep, work, play, leisure, and social participation.   CO-MORBIDITIES: may have co-morbidities  that affects occupational performance. Patient will benefit from skilled OT to address above impairments and improve overall function.  MODIFICATION OR ASSISTANCE TO COMPLETE EVALUATION: No modification of tasks or assist necessary to complete an evaluation.  OT OCCUPATIONAL PROFILE AND HISTORY: Detailed assessment: Review of records and additional review of physical, cognitive, psychosocial history related to current functional performance.  CLINICAL DECISION MAKING: LOW - limited treatment options, no task modification necessary  REHAB POTENTIAL: Good  EVALUATION COMPLEXITY: Low    PLAN:  OT FREQUENCY: 1x/week  OT DURATION: 12 weeks plus eval, may d/c after 8 weeks dependent on progress.  PLANNED INTERVENTIONS: self care/ADL training, therapeutic exercise, therapeutic activity, neuromuscular re-education, manual therapy, passive range of motion, balance training, functional mobility training, aquatic therapy, splinting, ultrasound, paraffin, moist heat, cryotherapy, contrast bath, patient/family education, cognitive remediation/compensation, energy conservation, coping strategies training, DME and/or AE instructions, and Re-evaluation  RECOMMENDED OTHER SERVICES: n/a  CONSULTED AND AGREED WITH PLAN OF CARE: Patient  PLAN FOR NEXT SESSION: update HEP   Dafna Romo, OT 08/20/2022, 7:59 AM

## 2022-08-20 ENCOUNTER — Encounter: Payer: Self-pay | Admitting: Physical Therapy

## 2022-08-20 NOTE — Therapy (Signed)
OUTPATIENT PHYSICAL THERAPY NEURO TREATMENT NOTE/AQUATIC THERAPY        Patient Name: Charlotte Hunter MRN: [id number] DOB:Aug 03, 1955, 67 y.o., female Today's Date: 08/20/2022   PCP: Audery Amel, MD REFERRING PROVIDER: Jones Bales, NP     PT End of Session - 08/20/22 1913     Visit Number 58    Number of Visits 62   renewal completed for 8 additional visits   Date for PT Re-Evaluation 09/06/22    Authorization Type Medicare    Authorization Time Period 01-03-22 - 04-03-22;  04-01-22 - 06-13-22; 06-03-22 - 08-13-22; 07-29-22 - 09-20-22    Progress Note Due on Visit 50    PT Start Time 1445    PT Stop Time 1530    PT Time Calculation (min) 45 min    Equipment Utilized During Treatment Other (comment)   aquatic step, bar bells, aquatic cuff   Activity Tolerance Patient tolerated treatment well    Behavior During Therapy WFL for tasks assessed/performed                                    Past Medical History:  Diagnosis Date   A-fib [facility])    Aortic insufficiency    Hypertension    Stroke [facility])    Past Surgical History:  Procedure Laterality Date   ABDOMINAL HYSTERECTOMY     ABLATION  05/03/2021   heart   c section  01/1968   KNEE SURGERY Right    meniscus   SHOULDER SURGERY Right 08/2014   Patient Active Problem List   Diagnosis Date Noted   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   ICH (intracerebral hemorrhage) ([name]) 10/01/2021   Paroxysmal atrial fibrillation ([name]) 09/29/2021   Stenosis of intracranial vessel 09/29/2021   Essential hypertension 09/29/2021   Blurry vision 09/29/2021   GERD (gastroesophageal reflux disease) 09/29/2021   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  Says she is still a little fearful of walking down her ramp at home by herself - feels like she will fall forward  Pt accompanied by: friend, Charlotte Hunter  PERTINENT HISTORY: 67 year old female here for evaluation of right thalamic intracerebral hemorrhage. Patient is was admitted to the hospital in June 2023 for new onset left-sided weakness headache and nausea. She was found to have intracerebral hemorrhage in the right thalamus. Patient was on Eliquis anticoagulation for atrial fibrillation at the time and this was reversed medically. She was started on hypertonic saline, blood pressure control in ICU admission. Patient went to inpatient rehabilitation. Now patient back home. Still has left-sided weakness and left hand and arm and leg pain. Has been to cardiology who recommended to hold anticoagulation for now as patient is not in atrial fibrillation.   Past medical history of atrial fibrillation on Eliquis aortic insufficiency, hypertension, fibromyalgia  PAIN:  Are you having pain? Yes Location: Lt side of face & chin, edge of Lt arm (lateral side), foot and ankle and Lt buttock  Intensity: 1-2/10  Nerve pain, occasional shock sensation, "rope-like sensation";   04-09-22: not so much in pain but more numbness in Lt side of face - states numbness is moving out    PRECAUTIONS: Fall  WEIGHT BEARING RESTRICTIONS No  FALLS: Has patient fallen in last 6 months? No  LIVING ENVIRONMENT: Lives  with: daughter, Charlotte Hunter Lives in: House/apartment Stairs: No - Ramp has been built at front entrance Has following equipment at home: Chiropractor, Environmental consultant - 2 wheeled, and bed side commode  PLOF: Independent; pt did gardening prior to CVA - was not employed prior to onset of CVA  PATIENT GOALS "be restored back to where I was"   OBJECTIVE:    Aquatic therapy at [facility] - pool temp 90 degrees  Patient seen for aquatic therapy today.  Treatment took place in water 3.5-4.5 feet deep depending on activity.  Pt entered and exited the pool via step negotiation with use of bil. Hand rails with CGA using a step by step sequence  Pt performed water walking 18' x 4 reps with no device with SBA only; pt performed sideways amb. 18' x 4 reps and backwards 18' x 4 reps with CGA    Pt performed step ups with LLE onto aquatic step 10 reps with RUE support on pool edge; step down for Lt quad eccentric strengthening 10 reps with min assist and with cues to flex Lt knee prior to descension off step  Marching in place 10 reps each leg with CGA  Squats 10 reps bil. LE's  Pt performed Lt hip flexion, extension, abduction with aquatic cuff 10 reps each direction - in standing:  Lt hip abdct./adduction with knee flexed at 90 degrees 10 reps  Performed Lt knee extension/flexion in seated position - no aquatic cuff used - 10 reps with pt actively flexing Lt knee to 90 degrees; in standing position - Lt knee extension/flexion with aquatic cuff on LLE 10 reps for increased resistance with knee flexion for hamstring strengthening 10 reps  Pt requires buoyancy of water for support for joint unloading and for reduced fall risk with gait training without device than can be safely performed on land. Water current produces perturbations for challenge for static & dynamic standing balance.  Buoyancy of water provides unweighting which assists with pain reduction.     TherEx:  Access Code: [email or web address] - issued on  01-17-22 URL: [email or web address] Date: 01/17/2022 Prepared by: Maebelle Munroe  Exercises - Seated Soleus Stretch  - 2-3 x daily - 7 x weekly - 1 sets - 2 reps - 15-20 secs  hold - Slant Board Gastrocnemius Stretch  - 3 x daily - 7 x weekly - 1 sets - 2 reps - 15-20 secs hold - Seated Gastroc Stretch with Strap  - 2-3 x daily - 7 x weekly - 1 sets - 2 reps - 15-20 hold   PATIENT EDUCATION:   Education details: continue to practice gait with RW at home despite ongoing anxiety and fear of falling Person educated: Patient Education method: Explanation, Demonstration Education comprehension: verbalized understanding, demonstration   HOME EXERCISE PROGRAM:  Previously issued Access Code: [email or web address] URL: [email or web address] Date: 01/08/2022 Prepared by: Maebelle Munroe  Exercises - Bridge  - 1 x daily - 7 x weekly - 1 sets - 10 reps - 5 hold - Single Leg Bridge  - 1 x daily - 7 x weekly - 1 sets - 10 reps - 3 sec hold - Hooklying Clamshell with Resistance  - 1 x daily - 7 x weekly - 1 sets - 10 reps - 2-3 sec hold - Supine Hip Flexion with Resistance Loop  - 1 x daily - 7 x weekly - 3 sets - 10 reps - Supine Heel Slide  - 1 x daily - 7 x weekly - 3 sets - 10 reps - Stepping out/in with LEFT leg  - 1 x daily - 7 x weekly - 3 sets - 10 reps - Hooklying Hamstring Stretch with Strap  - 1 x daily - 7 x weekly - 1 sets - 1-2 reps - 20 sec hold  GOALS: Goals reviewed with patient? Yes     NEW SHORT TERM GOALS:  TARGET DATE 08-23-22   Pt will amb. 14' with [facility] with SBA for increased household ambulation with this device.  Baseline;  pt reports using RW for assistance with household amb.   Goal status:  NEW  2.  Amb. 250' with RW with SBA on flat, even surface to demo improved endurance.  Baseline:  81' with RW   Goal status:  Ongoing  3. Pt will increase Berg balance test score to >/= 41/56 to reduce fall risk.  Baseline:  33/56 on 04-01-22; Berg score  34/56 on 05[birth year]08[birth year]54; 36/56 (4/10) Goal status: IN PROGRESS/ONGOING  4.  Pt will improve TUG score to </= 43 secs with RW to demo improved functional mobility and reduced fall risk.  Baseline: 2 min with SBQC:  59 sec with RW mod I (2/21), 49 sec with RW mod I (4/10) Goal status: IN PROGRESS/ONGOING  5.  Pt will be able to pick up object off floor from standing position without UE support with SBA to demo improved balance with bending/leaning forward and returning to upright position.    Baseline:  unable to pick up object off floor without UE support            Goal status:  NEW    6.  Pt will report ability to ambulate up and down ramp with RW at her home modified independently.   Baseline:  currently able to walk up ramp but not down the ramp Goal status:  NEW  7.  Pt will be able to kneel on Kay bench with min assist and return to standing.            Baseline:  TBA            Goal status:  NEW      NEW LONG TERM GOALS:  TARGET DATE: 09-13-22  Pt will report negotiating steps at her home with use  of handrail modified independently.  Baseline:  has negotiated steps with SBA from daughter Goal status:  NEW  2.   Independent in aquatic HEP to be continued upon discharge from PT.            Baseline:  in progress Goal status:  NEW      ASSESSMENT:  CLINICAL IMPRESSION: Aquatic PT session focused on LLE strengthening exercises, gait training with pt wearing Lt AFO and balance training.  Pt initially had difficulty flexing Lt knee to 90 degrees in seated position but was able to flex to 90 degrees after approx. 6 reps with pt exerting increased effort.  Pt c/o nerve pain in Lt foot with step down exercise from aquatic step but was able to take standing rest break for approx. 1" and resume exercise with decreased pain.  Pt tolerated aquatic exercises well.  Continue POC.    OBJECTIVE IMPAIRMENTS Abnormal gait, decreased activity tolerance, decreased balance, decreased coordination,  decreased endurance, decreased knowledge of use of DME, decreased ROM, decreased strength, impaired sensation, impaired tone, and impaired UE functional use.   ACTIVITY LIMITATIONS carrying, lifting, bending, standing, squatting, stairs, transfers, bed mobility, and locomotion level  PARTICIPATION LIMITATIONS: meal prep, cleaning, laundry, driving, shopping, community activity, and yard work  PERSONAL FACTORS Fitness and severity of deficits  are also affecting patient's functional outcome.   REHAB POTENTIAL: Good  CLINICAL DECISION MAKING: Evolving/moderate complexity  EVALUATION COMPLEXITY: Moderate  PLAN: PT FREQUENCY: 2x/week for 6 weeks (4 land visits/4 aquatic PT visits during this 6 week time period)  PT DURATION:  6 weeks  PLANNED INTERVENTIONS: Therapeutic exercises, Therapeutic activity, Neuromuscular re-education, Balance training, Gait training, Patient/Family education, Self Care, Stair training, Orthotic/Fit training, DME instructions, Aquatic Therapy, and Wheelchair mobility training  PLAN FOR NEXT SESSION:  Ladona Ridgel - Please begin checking STG's  (thank you) -   work on walking on grass, practice getting RW/LBQC in/out car (per pt's request): Cont with gait training with RW/LBQC; balance exercises/LLE SLS, strengthening exercises, being able to navigate stairs independently, pt also asking about sitting down on stairs and standing back up, retrieving an object from the floor, how is hamstring strength?   Mikhai Bienvenue, Donavan Burnet, PT  [facility] [address] [place], [place] [address] 08/20/2022, 7:20 PM

## 2022-08-21 ENCOUNTER — Ambulatory Visit: Payer: Medicare Other | Admitting: Physical Therapy

## 2022-08-21 DIAGNOSIS — M6281 Muscle weakness (generalized): Secondary | ICD-10-CM

## 2022-08-21 DIAGNOSIS — R2689 Other abnormalities of gait and mobility: Secondary | ICD-10-CM | POA: Diagnosis not present

## 2022-08-21 DIAGNOSIS — R278 Other lack of coordination: Secondary | ICD-10-CM

## 2022-08-21 DIAGNOSIS — R2681 Unsteadiness on feet: Secondary | ICD-10-CM

## 2022-08-21 NOTE — Therapy (Signed)
OUTPATIENT PHYSICAL THERAPY NEURO TREATMENT NOTE        Patient Name: Charlotte Hunter MRN: [id number] DOB:06-07-1955, 67 y.o., female Today's Date: 08/21/2022   PCP: Audery Amel, MD REFERRING PROVIDER: Jones Bales, NP     PT End of Session - 08/21/22 1156     Visit Number 59    Number of Visits 62   renewal completed for 8 additional visits   Date for PT Re-Evaluation 09/06/22    Authorization Type Medicare    Authorization Time Period 01-03-22 - 04-03-22;  04-01-22 - 06-13-22; 06-03-22 - 08-13-22; 07-29-22 - 09-20-22    Progress Note Due on Visit 50    PT Start Time 1155   pt arrived late   PT Stop Time 1230    PT Time Calculation (min) 35 min    Equipment Utilized During Treatment Gait belt    Activity Tolerance Patient tolerated treatment well    Behavior During Therapy WFL for tasks assessed/performed                                     Past Medical History:  Diagnosis Date   A-fib 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   ICH (intracerebral hemorrhage) ([name]) 10/01/2021   Paroxysmal atrial fibrillation ([name]) 09/29/2021   Stenosis of intracranial vessel 09/29/2021   Essential hypertension 09/29/2021   Blurry vision 09/29/2021   GERD (gastroesophageal reflux disease) 09/29/2021   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 Pt reports she is having increased pain through her L side today, 6/10, feels like a "knife". Pt takes her gabapentin at beginning of session and reports she feels like this medication does help. Pt got rollator for use in the yard, going to try it out today with help from her friend Clydie Braun.  Pt accompanied by: friend, Clydie Braun  PERTINENT HISTORY: 67 year old female here for evaluation of right thalamic intracerebral hemorrhage. Patient is was admitted to the hospital in June 2023 for new onset left-sided weakness headache and nausea. She was found to have intracerebral hemorrhage in the right thalamus. Patient was on Eliquis anticoagulation for atrial fibrillation at the time and this was reversed medically. She was started on hypertonic saline, blood pressure control in ICU admission. Patient went to inpatient rehabilitation. Now patient back home. Still has left-sided weakness and left hand and arm and leg pain. Has been to cardiology who recommended to hold anticoagulation for now as patient is not in atrial fibrillation.   Past medical history of atrial fibrillation on Eliquis aortic insufficiency, hypertension, fibromyalgia  PAIN:  Are you having pain? Yes Location: Lt side of face & chin, edge of Lt arm (lateral side), foot and ankle and Lt buttock  Intensity: 1-2/10  Nerve pain, occasional shock sensation, "rope-like sensation";   04-09-22: not so much in pain but more numbness in Lt side of face - states numbness is moving out    PRECAUTIONS: Fall  WEIGHT BEARING RESTRICTIONS No  FALLS: Has patient fallen in last 6 months? No  LIVING ENVIRONMENT: Lives with:  daughter, [name] Lives in: House/apartment Stairs: No - Ramp has been built at front entrance Has following equipment at home: Chiropractor, Environmental consultant - 2 wheeled, and bed side commode  PLOF: Independent; pt did gardening prior to CVA - was not employed prior to onset of CVA  PATIENT GOALS "be restored back to where I was"   OBJECTIVE:    TherAct Discussed PT POC with pt unable to reschedule missed clinic appointment from last week within this POC. Will discuss with primary PT and reach out to patient about scheduling.   Gait Gait pattern:  LLE ataxic and decreased hip/knee flexion- Left Distance walked: 115 ft Assistive device utilized: Walker - 2 wheeled Level of assistance: SBA Comments: tends to walk to L side of her RW and hits RW with LLE, encouraged pt to try to stay centered in RW; pt does exhibit improved gait speed as compared to previous sessions  Gait pattern: decreased hip/knee flexion- Left Distance walked: 115 ft Assistive device utilized: Quad cane large base Level of assistance: CGA Comments: improved gait speed and sequencing despite pain; improved LLE control    TherEx:  Access Code: [email or web address] - issued on 01-17-22 URL: [email or web address] Date: 01/17/2022 Prepared by: Maebelle Munroe  Exercises - Seated Soleus Stretch  - 2-3 x daily - 7 x weekly - 1 sets - 2 reps - 15-20 secs  hold - Slant Board Gastrocnemius Stretch  - 3 x daily - 7 x weekly - 1 sets - 2 reps - 15-20 secs hold - Seated Gastroc Stretch with Strap  - 2-3 x daily - 7 x weekly - 1 sets - 2 reps - 15-20 hold   PATIENT EDUCATION:   Education details: continue to practice gait with RW, PT POC Person educated: Patient Education method: Explanation Education comprehension: verbalized understanding   HOME EXERCISE PROGRAM:  Previously issued Access Code: [email or web address] URL: [email or web address] Date: 01/08/2022 Prepared by: Maebelle Munroe  Exercises - Bridge  - 1  x daily - 7 x weekly - 1 sets - 10 reps - 5 hold - Single Leg Bridge  - 1 x daily - 7 x weekly - 1 sets - 10 reps - 3 sec hold - Hooklying Clamshell with Resistance  - 1 x  daily - 7 x weekly - 1 sets - 10 reps - 2-3 sec hold - Supine Hip Flexion with Resistance Loop  - 1 x daily - 7 x weekly - 3 sets - 10 reps - Supine Heel Slide  - 1 x daily - 7 x weekly - 3 sets - 10 reps - Stepping out/in with LEFT leg  - 1 x daily - 7 x weekly - 3 sets - 10 reps - Hooklying Hamstring Stretch with Strap  - 1 x daily - 7 x weekly - 1 sets - 1-2 reps - 20 sec hold  GOALS: Goals reviewed with patient? Yes     NEW SHORT TERM GOALS:  TARGET DATE 08-23-22   Pt will amb. 97' with [facility] with SBA for increased household ambulation with this device.  Baseline;  pt reports using RW for assistance with household amb; 115 ft with [facility] and CGA (5/8)  Goal status:  IN PROGRESS  2.  Amb. 250' with RW with SBA on flat, even surface to demo improved endurance.  Baseline:  64' with RW (5/8)  Goal status:  IN PROGRESS  3. Pt will increase Berg balance test score to >/= 41/56 to reduce fall risk.  Baseline:  33/56 on 04-01-22; Berg score 34/56 on 05-08-54; 36/56 (4/10) Goal status: IN PROGRESS/ONGOING  4.  Pt will improve TUG score to </= 43 secs with RW to demo improved functional mobility and reduced fall risk.  Baseline: 2 min with SBQC:  59 sec with RW mod I (2/21), 49 sec with RW mod I (4/10) Goal status: IN PROGRESS/ONGOING  5.  Pt will be able to pick up object off floor from standing position without UE support with SBA to demo improved balance with bending/leaning forward and returning to upright position.    Baseline:  unable to pick up object off floor without UE support, still requires UE support (5/8)            Goal status:  IN PROGRESS    6.  Pt will report ability to ambulate up and down ramp with RW at her home modified independently.   Baseline:  currently able to walk up ramp but not down the ramp,  still needs SBA for descent (5/8) Goal status:  IN PROGRESS  7.  Pt will be able to kneel on Kay bench with min assist and return to standing.            Baseline:  TBA            Goal status:  NEW      NEW LONG TERM GOALS:  TARGET DATE: 09-13-22  Pt will report negotiating steps at her home with use of handrail modified independently.  Baseline:  has negotiated steps with SBA from daughter Goal status:  NEW  2.   Independent in aquatic HEP to be continued upon discharge from PT.            Baseline:  in progress Goal status:  NEW      ASSESSMENT:  CLINICAL IMPRESSION: Session limited by patient's late arrival. Emphasis of skilled PT session on attempting to assess STG but limited due to pt's late arrival and pain this date. Pt is making progress towards 6/7 STG. Pt continues to be limited in her ambulation distance in the clinic due to pain and fatigue and continues to not feeling comfortable descending her ramp at home with her RW independently. Pt also remains limited by her thalamic pain syndrome  but is motivated this session to continue working despite having nerve pain through L hemibody. Pt also remains unsafe to perform certain activities such as floor transfers, sitting on her low garden bench, or using a rollator for transfers and sitting on the rollator seat for gardening. Despite this education pt does plan to utilize a rollator outdoors for her gardening, is going to try it out today with assist from her friend. Pt continues to benefit from skilled therapy services to work towards LTGs. Continue POC.    OBJECTIVE IMPAIRMENTS Abnormal gait, decreased activity tolerance, decreased balance, decreased coordination, decreased endurance, decreased knowledge of use of DME, decreased ROM, decreased strength, impaired sensation, impaired tone, and impaired UE functional use.   ACTIVITY LIMITATIONS carrying, lifting, bending, standing, squatting, stairs, transfers, bed mobility, and  locomotion level  PARTICIPATION LIMITATIONS: meal prep, cleaning, laundry, driving, shopping, community activity, and yard work  PERSONAL FACTORS Fitness and severity of deficits  are also affecting patient's functional outcome.   REHAB POTENTIAL: Good  CLINICAL DECISION MAKING: Evolving/moderate complexity  EVALUATION COMPLEXITY: Moderate  PLAN: PT FREQUENCY: 2x/week for 6 weeks (4 land visits/4 aquatic PT visits during this 6 week time period)  PT DURATION:  6 weeks  PLANNED INTERVENTIONS: Therapeutic exercises, Therapeutic activity, Neuromuscular re-education, Balance training, Gait training, Patient/Family education, Self Care, Stair training, Orthotic/Fit training, DME instructions, Aquatic Therapy, and Wheelchair mobility training  PLAN FOR NEXT SESSION:  work on walking on grass, practice getting RW/LBQC in/out car (per pt's request): Cont with gait training with RW/LBQC; balance exercises/LLE SLS, strengthening exercises, needs to schedule final clinic visit   Peter Congo, PT, DPT, CSRS  [facility] [address] [place], [place] [address] 08/21/2022, 12:31 PM

## 2022-08-26 ENCOUNTER — Ambulatory Visit: Payer: Medicare Other | Admitting: Occupational Therapy

## 2022-08-26 ENCOUNTER — Ambulatory Visit: Payer: Medicare Other | Admitting: Physical Therapy

## 2022-08-26 DIAGNOSIS — R208 Other disturbances of skin sensation: Secondary | ICD-10-CM

## 2022-08-26 DIAGNOSIS — M6281 Muscle weakness (generalized): Secondary | ICD-10-CM

## 2022-08-26 DIAGNOSIS — R278 Other lack of coordination: Secondary | ICD-10-CM

## 2022-08-26 DIAGNOSIS — R2689 Other abnormalities of gait and mobility: Secondary | ICD-10-CM

## 2022-08-26 DIAGNOSIS — R29818 Other symptoms and signs involving the nervous system: Secondary | ICD-10-CM

## 2022-08-26 DIAGNOSIS — I69318 Other symptoms and signs involving cognitive functions following cerebral infarction: Secondary | ICD-10-CM

## 2022-08-26 DIAGNOSIS — R2681 Unsteadiness on feet: Secondary | ICD-10-CM

## 2022-08-26 DIAGNOSIS — I69154 Hemiplegia and 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 left non-dominant side: Secondary | ICD-10-CM

## 2022-08-26 NOTE — Therapy (Signed)
OUTPATIENT OCCUPATIONAL THERAPY NEURO Treatment   Patient Name: Charlotte Hunter MRN: [id number] DOB:[birth year], 67 y.o., female Today's Date: 08/26/2022  PCP: Dr. Syble Creek REFERRING PROVIDER: Dr. Wynn Banker   END OF SESSION:  OT End of Session - 08/26/22 1247     Visit Number 2    Number of Visits 13    Date for OT Re-Evaluation 11/17/22    Authorization Type M[employer]    Authorization - Visit Number 2    Authorization - Number of Visits 10    Progress Note Due on Visit 10    OT Start Time 1234    OT Stop Time 1315    OT Time Calculation (min) 41 min    Activity Tolerance Patient tolerated treatment well    Behavior During Therapy WFL for tasks assessed/performed              Past Medical History:  Diagnosis Date   A-fib ([name])    Aortic insufficiency    Hypertension    Stroke [facility])    Past Surgical History:  Procedure Laterality Date   ABDOMINAL HYSTERECTOMY     ABLATION  05/03/2021   heart   c section  01/1968   KNEE SURGERY Right    meniscus   SHOULDER SURGERY Right 08/2014   Patient Active Problem List   Diagnosis Date Noted   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   ICH (intracerebral hemorrhage) ([name]) 10/01/2021   Paroxysmal atrial fibrillation ([name]) 09/29/2021   Stenosis of intracranial vessel 09/29/2021   Essential hypertension 09/29/2021   Blurry vision 09/29/2021   GERD (gastroesophageal reflux disease) 09/29/2021   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 09/25/2021    ONSET DATE: 07/11/22- referral date  REFERRING DIAG:  Diagnosis  I61.9 (ICD-10-CM) - Hemorrhagic stroke ([name])    THERAPY DIAG:  Muscle weakness (generalized)  Other lack of coordination  Unsteadiness on feet  Other abnormalities of gait and mobility  Hemiplegia and 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 left non-dominant side ([name])  Other symptoms and signs involving the nervous system  Other disturbances of skin  sensation  Other symptoms and signs involving cognitive functions following cerebral infarction  Rationale for Evaluation and Treatment: Rehabilitation  SUBJECTIVE:   SUBJECTIVE STATEMENT:  Pt accompanied by: dtr  PERTINENT HISTORY: 67 year old female here for evaluation of right thalamic intracerebral hemorrhage. Patient is was admitted to the hospital in June 2023 for new onset left-sided weakness headache and nausea. She was found to have intracerebral hemorrhage in the right thalamus. Patient was on Eliquis anticoagulation for atrial fibrillation at the time and this was reversed medically. She was started on hypertonic saline, blood pressure control in ICU admission. Patient went to inpatient rehabilitation. Now patient back home. Still has left-sided weakness and left hand and arm and leg pain. Has been to cardiology who recommended to hold anticoagulation for now as patient is not in atrial fibrillation.    Past medical history of atrial fibrillation on Eliquis aortic insufficiency, hypertension, fibromyalgia Pt received OT at [address] And was d/c in February PRECAUTIONS: Fall  WEIGHT BEARING RESTRICTIONS: No  PAIN:  Are you having pain? Yes: NPRS scale: 5/10 Pain location: left hand  Pain description: sharp, burning Aggravating factors: use Relieving factors: meds, rest  FALLS: Has patient fallen in last 6 months? 1 near fall  LIVING ENVIRONMENT: Lives with: lives with their family and lives with their daughter  Has following equipment at home: Shower bench  PLOF: Independent  PATIENT GOALS: continue to increase  functional use of LUE  OBJECTIVE:   HAND DOMINANCE: Right  ADLs: Overall ADLs: Pt is performing basic ADLs mod I  Transfers/ambulation related to ADLs: Eating: mod I Grooming: mod I UB Dressing: mod I LB Dressing: mod I Toileting: mod I uses walker and w/c Bathing: mod I has tub bench Tub Shower transfers: mod I Equipment: Transfer tub bench and bed  side commode  IADLs: Shopping: dtr performs the grocery shopping  Light housekeeping: pt vacuums seated in w/c  Meal Prep: uses oven mod I Community mobility: supervision with walker, alos has w/c Medication management: mod I Financial management: mod I   MOBILITY STATUS:  supervision to mod I with walker    ACTIVITY TOLERANCE: Activity tolerance: Pt fatigues quickly with ambulation    UPPER EXTREMITY ROM:  RUE WFLS, LUE pt has full composite flexion. Pt has hyperextension at PIP joint and flexion at DIP for ring finger  Active ROM Right eval Left eval  Shoulder flexion  80 with elbow flexed  Shoulder abduction    Shoulder adduction    Shoulder extension    Shoulder internal rotation    Shoulder external rotation    Elbow flexion  WFLS  Elbow extension  -15  Wrist flexion  WFLs  Wrist extension    Wrist ulnar deviation    Wrist radial deviation    Wrist pronation  WFLS  Wrist supination  WFLS  (Blank rows = not tested)  HAND FUNCTION: Grip strength: Right: 65 lbs; Left: 18 lbs  COORDINATION: 9 Hole Peg test: Right: NT sec; Left: 3 pegs placed in 2 mins sec  SENSATION: LUE  near absent sensation  EDEMA: n/a  MUSCLE TONE: LUE: Mild and Hypertonic  COGNITION: Overall cognitive status: Within functional limits for tasks assessed    OBSERVATIONS: Pt became fatigued when walking to [place] requiring a w/c for rest. Pt was assisted into her friends car and she was doing ok when she left.   TODAY'S TREATMENT:                                                                                                                              DATE: 08/26/22 supine closed chain shoulder flexion with PVC pipe frame, min v.c and facilitation Seated closed chain reaching for the floor with bilateral UE's min v.c Seated and standing, UE ranger for shoulder flexion and circumduction, min v.c / min-mod facilitation Seated weightbearing through Left elbow, min facilitation/  v.c Functional reaching into eye level cabinets to place and remove lightweight items, mod difficulty, min facilitation/ v.c supervision for safety.   HOME EXERCISE PROGRAM: Not issued yet   GOALS: Goals reviewed with patient? No  SHORT TERM GOALS: Target date: 09/18/22  I with HEP  Goal status: INITIAL  2.  Pt will demonstrate improved LUE functional use as evidenced by increasing increase box and blocks score by 3 blocks  Goal status: INITIAL  3.  Pt will verbalize understanding of  AE/ DME options to increase safety and independence with ADLs/IADLs.  Goal status: INITIAL  4.  Pt will demonstrate LUE shoulder flexion to  85* with min compensation for improved functional reach.  Goal status: INITIAL  5.  Pt will utilize LUE as a gross assist for ADLs at least 50% of the time with pain no greater than 4/10.  Goal status: INITIAL    LONG TERM GOALS: Target date: 11/17/22  I with updated HEP.  Goal status: INITIAL  2.  Pt will report ability to perform simple garden task with AE/DME prn and supervision, following assist for set up.  Goal status: INITIAL  3.  Pt will demonstrate 90* shoulder flexion with min compensation for increased LUE functional use. Goal status: INITIAL  4.  Pt will demonstrate improved LUE functional use as evidenced by increasing increase box and blocks score by 6 blocks from eval  Goal status: INITIAL   ASSESSMENT:  CLINICAL IMPRESSION: Patient is progressing towards goals she demonstrates improving functional reach today.  PERFORMANCE DEFICITS: in functional skills including ADLs, IADLs, coordination, dexterity, sensation, tone, ROM, strength, pain, flexibility, Fine motor control, Gross motor control, mobility, endurance, decreased knowledge of precautions, decreased knowledge of use of DME, and UE functional use, cognitive skills including safety awareness, and psychosocial skills including coping strategies, environmental adaptation,  habits, interpersonal interactions, and routines and behaviors.   IMPAIRMENTS: are limiting patient from ADLs, IADLs, rest and sleep, work, play, leisure, and social participation.   CO-MORBIDITIES: may have co-morbidities  that affects occupational performance. Patient will benefit from skilled OT to address above impairments and improve overall function.  MODIFICATION OR ASSISTANCE TO COMPLETE EVALUATION: No modification of tasks or assist necessary to complete an evaluation.  OT OCCUPATIONAL PROFILE AND HISTORY: Detailed assessment: Review of records and additional review of physical, cognitive, psychosocial history related to current functional performance.  CLINICAL DECISION MAKING: LOW - limited treatment options, no task modification necessary  REHAB POTENTIAL: Good  EVALUATION COMPLEXITY: Low    PLAN:  OT FREQUENCY: 1x/week  OT DURATION: 12 weeks plus eval, may d/c after 8 weeks dependent on progress.  PLANNED INTERVENTIONS: self care/ADL training, therapeutic exercise, therapeutic activity, neuromuscular re-education, manual therapy, passive range of motion, balance training, functional mobility training, aquatic therapy, splinting, ultrasound, paraffin, moist heat, cryotherapy, contrast bath, patient/family education, cognitive remediation/compensation, energy conservation, coping strategies training, DME and/or AE instructions, and Re-evaluation  RECOMMENDED OTHER SERVICES: n/a  CONSULTED AND AGREED WITH PLAN OF CARE: Patient  PLAN FOR NEXT SESSION: update HEP   Mazie Fencl, OT 08/26/2022, 4:05 PM

## 2022-08-27 ENCOUNTER — Encounter: Payer: Self-pay | Admitting: Physical Therapy

## 2022-08-27 NOTE — Therapy (Signed)
OUTPATIENT PHYSICAL THERAPY NEURO TREATMENT NOTE/AQUATIC THERAPY/10th VISIT PROGRESS NOTE     Progress Note  Reporting Period 07-15-22 to 08-26-22  See note below for Objective Data and Assessment of Progress/Goals.       Patient Name: Charlotte Hunter MRN: [id number] DOB:February 10, 1956, 67 y.o., female Today's Date: 08/27/2022   PCP: Audery Amel, MD REFERRING PROVIDER: Jones Bales, NP     PT End of Session - 08/27/22 1355     Visit Number 60    Number of Visits 62   renewal completed for 8 additional visits   Date for PT Re-Evaluation 09/06/22    Authorization Type Medicare    Authorization Time Period 01-03-22 - 04-03-22;  04-01-22 - 06-13-22; 06-03-22 - 08-13-22; 07-29-22 - 09-20-22    Progress Note Due on Visit 50    PT Start Time 1400    PT Stop Time 1445    PT Time Calculation (min) 45 min    Equipment Utilized During Treatment Other (comment)   barbells, pool noodle   Activity Tolerance Patient tolerated treatment well    Behavior During Therapy [employer] for tasks assessed/performed                                    Past Medical History:  Diagnosis Date   A-fib ([name])    Aortic insufficiency    Hypertension    Stroke [facility])    Past Surgical History:  Procedure Laterality Date   ABDOMINAL HYSTERECTOMY     ABLATION  05/03/2021   heart   c section  01/1968   KNEE SURGERY Right    meniscus   SHOULDER SURGERY Right 08/2014   Patient Active Problem List   Diagnosis Date Noted   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   ICH (intracerebral hemorrhage) ([name]) 10/01/2021   Paroxysmal atrial fibrillation ([name]) 09/29/2021   Stenosis of intracranial vessel 09/29/2021   Essential hypertension 09/29/2021   Blurry vision 09/29/2021   GERD (gastroesophageal reflux disease) 09/29/2021   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 In her home with the quad cane by herself this past week; used rollator outside in her yard with friend, Colin Broach, assistance and says it worked well - "I think I am going to keep it"; pt informed that her final land PT appt is scheduled for May 29  Pt accompanied by: daughter, [name]  PERTINENT HISTORY: 67 year old female here for evaluation of right thalamic intracerebral hemorrhage. Patient is was admitted to the hospital in June 2023 for new onset left-sided weakness headache and nausea. She was found to have intracerebral hemorrhage in the right thalamus. Patient was on Eliquis anticoagulation for atrial fibrillation at the time and this was reversed medically. She was started on hypertonic saline, blood pressure control in ICU admission. Patient went to inpatient rehabilitation. Now patient back home. Still has left-sided weakness and left hand and arm and leg pain. Has been to cardiology who recommended to hold anticoagulation for now as patient is not in atrial fibrillation.   Past medical history of atrial fibrillation on Eliquis aortic insufficiency, hypertension, fibromyalgia  PAIN:  Are you having pain? Yes Location: Lt side of face & chin, edge of Lt arm (lateral side), foot and ankle and Lt buttock  Intensity: 1-2/10  Nerve pain, occasional shock sensation, "rope-like sensation";   04-09-22: not so much in pain but more numbness in Lt side of face - states numbness is moving out     PRECAUTIONS: Fall  WEIGHT BEARING RESTRICTIONS No  FALLS: Has patient fallen in last 6 months? No  LIVING ENVIRONMENT: Lives with: daughter, [name] Lives in: House/apartment Stairs: No - Ramp has been built at front entrance Has following equipment at home: Chiropractor, Environmental consultant - 2 wheeled, and bed side commode  PLOF: Independent; pt did gardening prior to CVA - was not employed prior to onset of CVA  PATIENT GOALS "be restored back to where I was"   OBJECTIVE:    Aquatic therapy at [facility] - pool temp 90 degrees  Patient seen for aquatic therapy today.  Treatment took place in water 3.5-4.5 feet deep depending on activity.  Pt entered and exited the pool via step negotiation with use of bil. Hand rails with CGA using a step by step sequence  Pt performed water walking 18' x 4 reps with no device with SBA only; pt performed sideways amb. 18' x 4 reps and backwards 18' x 4 reps with CGA   Pt performed step ups with LLE onto aquatic step 10 reps with RUE support on pool edge; step down for Lt quad eccentric strengthening 10 reps with CGA and with cues to flex Lt knee prior to descension off step  Marching in place 10 reps each leg with CGA  Squats 10 reps bil. LE's  Pt performed Lt hip flexion, extension, abduction without use of aquatic cuff -  10 reps each direction - in standing:   Performed Lt knee extension/flexion in seated position - no aquatic cuff used - 10 reps with pt actively flexing Lt knee to 90 degrees  Pt performed closed chain Lt hip and knee extension strengthening with foot on yellow noodle - held in position by PT - 10 reps  Pt requires buoyancy of water for support for joint unloading and for reduced fall risk with gait training without device than can be safely performed on land. Water current produces perturbations for challenge for static & dynamic standing balance.  Buoyancy of water provides unweighting which assists with pain reduction.      TherEx:  Access Code: [email or web address] - issued on 01-17-22 URL: [email or web address] Date: 01/17/2022 Prepared by: Maebelle Munroe  Exercises - Seated Soleus Stretch  - 2-3 x daily - 7 x weekly - 1 sets - 2 reps - 15-20 secs  hold - Slant Board Gastrocnemius Stretch  -  3 x daily - 7 x weekly - 1 sets - 2 reps - 15-20 secs hold - Seated Gastroc Stretch with Strap  - 2-3 x daily - 7 x weekly - 1 sets - 2 reps - 15-20 hold   PATIENT EDUCATION:   Education details: continue to practice gait with RW at home despite ongoing anxiety and fear of falling Person educated: Patient Education method: Explanation, Demonstration Education comprehension: verbalized understanding, demonstration   HOME EXERCISE PROGRAM:  Previously issued Access Code: [email or web address] URL: [email or web address] Date: 01/08/2022 Prepared by: Maebelle Munroe  Exercises - Bridge  - 1 x daily - 7 x weekly - 1 sets - 10 reps - 5 hold - Single Leg Bridge  - 1 x daily - 7 x weekly - 1 sets - 10 reps - 3 sec hold - Hooklying Clamshell with Resistance  - 1 x daily - 7 x weekly - 1 sets - 10 reps - 2-3 sec hold - Supine Hip Flexion with Resistance Loop  - 1 x daily - 7 x weekly - 3 sets - 10 reps - Supine Heel Slide  - 1 x daily - 7 x weekly - 3 sets - 10 reps - Stepping out/in with LEFT leg  - 1 x daily - 7 x weekly - 3 sets - 10 reps - Hooklying Hamstring Stretch with Strap  - 1 x daily - 7 x weekly - 1 sets - 1-2 reps - 20 sec hold  GOALS: Goals reviewed with patient? Yes    NEW SHORT TERM GOALS:  TARGET DATE 08-23-22   Pt will amb. 92' with [facility] with SBA for increased household ambulation with this device.  Baseline;  pt reports using RW for assistance with household amb; 115 ft with [facility] and CGA (5/8)  Goal status:  IN PROGRESS  2.  Amb. 250' with RW with SBA on flat, even surface to demo improved endurance.  Baseline:  56' with RW (5/8)  Goal status:  IN PROGRESS  3. Pt will increase Berg  balance test score to >/= 41/56 to reduce fall risk.  Baseline:  33/56 on 04-01-22; Berg score 34/56 on 05-08-54; 36/56 (4/10) Goal status: IN PROGRESS/ONGOING  4.  Pt will improve TUG score to </= 43 secs with RW to demo improved functional mobility and reduced fall risk.  Baseline: 2 min with SBQC:  59 sec with RW mod I (2/21), 49 sec with RW mod I (4/10) Goal status: IN PROGRESS/ONGOING  5.  Pt will be able to pick up object off floor from standing position without UE support with SBA to demo improved balance with bending/leaning forward and returning to upright position.    Baseline:  unable to pick up object off floor without UE support, still requires UE support (5/8)            Goal status:  IN PROGRESS    6.  Pt will report ability to ambulate up and down ramp with RW at her home modified independently.   Baseline:  currently able to walk up ramp but not down the ramp, still needs SBA for descent (5/8) Goal status:  IN PROGRESS  7.  Pt will be able to kneel on Kay bench with min assist and return to standing.            Baseline:  TBA            Goal status:  NEW      NEW LONG TERM GOALS:  TARGET DATE:  09-13-22  Pt will report negotiating steps at her home with use of handrail modified independently.  Baseline:  has negotiated steps with SBA from daughter Goal status:  NEW  2.   Independent in aquatic HEP to be continued upon discharge from PT.            Baseline:  in progress Goal status:  NEW      ASSESSMENT:  CLINICAL IMPRESSION: This 10th visit progress note covers dates 07-15-22 - 08-26-22.  Pt has partially met all STG's as pt has made progress but not fully achieved stated goals.   Today's aquatic PT session focused on LLE strengthening exercises, gait training with pt wearing Lt AFO and balance training.   Pt demonstrated improved balance during gait training in 4' water depth as pt amb. With SBA to supervision without UE support on any flotation device for first  time in today's session.  Pt tolerated aquatic exercises well.  Continue POC.    OBJECTIVE IMPAIRMENTS Abnormal gait, decreased activity tolerance, decreased balance, decreased coordination, decreased endurance, decreased knowledge of use of DME, decreased ROM, decreased strength, impaired sensation, impaired tone, and impaired UE functional use.   ACTIVITY LIMITATIONS carrying, lifting, bending, standing, squatting, stairs, transfers, bed mobility, and locomotion level  PARTICIPATION LIMITATIONS: meal prep, cleaning, laundry, driving, shopping, community activity, and yard work  PERSONAL FACTORS Fitness and severity of deficits  are also affecting patient's functional outcome.   REHAB POTENTIAL: Good  CLINICAL DECISION MAKING: Evolving/moderate complexity  EVALUATION COMPLEXITY: Moderate  PLAN: PT FREQUENCY: 2x/week for 6 weeks (4 land visits/4 aquatic PT visits during this 6 week time period)  PT DURATION:  6 weeks  PLANNED INTERVENTIONS: Therapeutic exercises, Therapeutic activity, Neuromuscular re-education, Balance training, Gait training, Patient/Family education, Self Care, Stair training, Orthotic/Fit training, DME instructions, Aquatic Therapy, and Wheelchair mobility training  PLAN FOR NEXT SESSION: Please check goals and I will do D/C:   work on walking on grass, practice getting RW/LBQC in/out car (per pt's request): Cont with gait training with RW/LBQC; balance exercises/LLE SLS, strengthening exercises, being able to navigate stairs independently, pt also asking about sitting down on stairs and standing back up, retrieving an object from the floor, how is hamstring strength?   Khylon Davies, Donavan Burnet, PT  [facility] [address] [place], [place] [address] 08/27/2022, 2:02 PM

## 2022-08-31 ENCOUNTER — Emergency Department: Payer: Medicare Other

## 2022-08-31 ENCOUNTER — Emergency Department
Admission: EM | Admit: 2022-08-31 | Discharge: 2022-08-31 | Disposition: A | Discharge status: Home / Self Care | Payer: Medicare Other | Attending: Emergency Medicine | Admitting: Emergency Medicine

## 2022-08-31 ENCOUNTER — Encounter: Payer: Self-pay | Admitting: Emergency Medicine

## 2022-08-31 ENCOUNTER — Other Ambulatory Visit: Payer: Self-pay

## 2022-08-31 DIAGNOSIS — R200 Anesthesia of skin: Secondary | ICD-10-CM | POA: Diagnosis present

## 2022-08-31 DIAGNOSIS — R519 Headache, unspecified: Secondary | ICD-10-CM | POA: Diagnosis not present

## 2022-08-31 DIAGNOSIS — Z8673 Personal history of transient ischemic attack (TIA), and cerebral infarction without residual deficits: Secondary | ICD-10-CM | POA: Diagnosis not present

## 2022-08-31 DIAGNOSIS — R202 Paresthesia of skin: Secondary | ICD-10-CM | POA: Diagnosis not present

## 2022-08-31 LAB — CBC WITH DIFFERENTIAL/PLATELET
Abs Immature Granulocytes: 0.01 10*3/uL (ref 0.00–0.07)
Basophils Absolute: 0.1 10*3/uL (ref 0.0–0.1)
Basophils Relative: 1 %
Eosinophils Absolute: 0.2 10*3/uL (ref 0.0–0.5)
Eosinophils Relative: 3 %
HCT: 40.1 % (ref 36.0–46.0)
Hemoglobin: 13 g/dL (ref 12.0–15.0)
Immature Granulocytes: 0 %
Lymphocytes Relative: 38 %
Lymphs Abs: 2.4 10*3/uL (ref 0.7–4.0)
MCH: 31 pg (ref 26.0–34.0)
MCHC: 32.4 g/dL (ref 30.0–36.0)
MCV: 95.7 fL (ref 80.0–100.0)
Monocytes Absolute: 0.4 10*3/uL (ref 0.1–1.0)
Monocytes Relative: 6 %
Neutro Abs: 3.4 10*3/uL (ref 1.7–7.7)
Neutrophils Relative %: 52 %
Platelets: 233 10*3/uL (ref 150–400)
RBC: 4.19 MIL/uL (ref 3.87–5.11)
RDW: 13.7 % (ref 11.5–15.5)
WBC: 6.5 10*3/uL (ref 4.0–10.5)
nRBC: 0 % (ref 0.0–0.2)

## 2022-08-31 LAB — COMPREHENSIVE METABOLIC PANEL
ALT: 15 U/L (ref 0–44)
AST: 23 U/L (ref 15–41)
Albumin: 4.6 g/dL (ref 3.5–5.0)
Alkaline Phosphatase: 71 U/L (ref 38–126)
Anion gap: 10 (ref 5–15)
BUN: 11 mg/dL (ref 8–23)
CO2: 25 mmol/L (ref 22–32)
Calcium: 9.9 mg/dL (ref 8.9–10.3)
Chloride: 104 mmol/L (ref 98–111)
Creatinine, Ser: 0.7 mg/dL (ref 0.44–1.00)
GFR, Estimated: >60 mL/min (ref >60)
Glucose, Bld: 101 mg/dL — ABNORMAL HIGH (ref 70–99)
Potassium: 3.7 mmol/L (ref 3.5–5.1)
Sodium: 139 mmol/L (ref 135–145)
Total Bilirubin: 0.6 mg/dL (ref 0.3–1.2)
Total Protein: 7.9 g/dL (ref 6.5–8.1)

## 2022-08-31 MED ORDER — GABAPENTIN 300 MG PO CAPS
400.0000 mg | ORAL_CAPSULE | Freq: Once | ORAL | Status: AC
  Administered 2022-08-31: 400 mg via ORAL
  Filled 2022-08-31: qty 1

## 2022-08-31 NOTE — Discharge Instructions (Addendum)
Your tests today, including an MRI of the brain, do not show any acute issues.  Please continue to follow-up with your doctor.

## 2022-08-31 NOTE — ED Notes (Signed)
Patient ambulatory to restroom with walker.  

## 2022-08-31 NOTE — ED Triage Notes (Signed)
Patient to ED via [facility] from home for left side mouth and left foot numbness- more so than normal. Hx of stroke on that left side. LWK 9:45 this AM. Numbness back to normal numbness from stroke approx 1 year ago.

## 2022-08-31 NOTE — Progress Notes (Signed)
On-call phone note  Called by Dr. Rosalia Hammers, EDP at [facility], for the patient who comes in with worsening of left mouth and left-sided numbness that has since resolved.  History of a right thalamic ICH last year.  History of paroxysmal atrial fibrillation but she has chosen not to go back on anticoagulation-her cardiology team has agreed because she only had paroxysmal A-fib without any return of atrial fibrillation.  At this point, I would recommend getting an MRI of the brain and if there is any new findings, would recommend admission otherwise she has outpatient neurology follow-up and can follow outpatient for this possible brief episode of numbness likely related to recrudescence of symptoms from the old right thalamic ICH.  .-- Milon Dikes, MD Neurologist [facility] Pager: [phone]

## 2022-08-31 NOTE — ED Notes (Signed)
Patient to MRI at this time.

## 2022-08-31 NOTE — ED Provider Notes (Signed)
Procedures  Clinical Course as of 08/31/22 1748  Sat Aug 31, 2022  1415 CT head and labs reassuring. Case discussed with Dr. Wilford Corner. Recommends MRI in ER. If significant ischemic stroke, recommends admission for further evaluation.  However if this is reassuring, patient can be discharged for continued outpatient follow-up. [NR]  1515 Signed out to oncoming physician pending MRI, reevaluation, and disposition. [NR]    Clinical Course User Index [NR] Trinna Post, MD    ----------------------------------------- 5:48 PM on 08/31/2022 ----------------------------------------- MRI result reviewed, no acute findings.  Patient remains approximately in her baseline state of health according to the patient.  Stable for discharge     Sharman Cheek, MD 08/31/22 1749

## 2022-08-31 NOTE — ED Provider Notes (Signed)
[facility] Provider Note    Event Date/Time   First MD Initiated Contact with Patient 08/31/22 1223     (approximate)   History   Numbness   HPI  Charlotte Hunter is a 67 y.o. female with history of hemorrhagic stroke with residual left-sided weakness and numbness in 2023 presenting to the emergency department for evaluation of left mouth and foot numbness.  Patient reports that around 945 this morning she had onset of increased numbness around her left lateral lip as well as over her foot.  This lasted for several minutes, but has now resolved.  She does have some ongoing numbness, but reports that this is her baseline poststroke.  Reports a mild headache earlier today, denies other complaints.    Physical Exam   Triage Vital Signs: ED Triage Vitals  Enc Vitals Group     BP 08/31/22 1225 (!) 160/84     Pulse Rate 08/31/22 1225 75     Resp 08/31/22 1225 18     Temp 08/31/22 1225 98.4 F (36.9 C)     Temp Source 08/31/22 1225 Oral     SpO2 08/31/22 1225 95 %     Weight 08/31/22 1223 177 lb 3.2 oz (80.4 kg)     Height --      Head Circumference --      Peak Flow --      Pain Score 08/31/22 1219 0     Pain Loc --      Pain Edu? --      Excl. in GC? --     Most recent vital signs: Vitals:   08/31/22 1400 08/31/22 1500  BP: (!) 151/85 (!) 154/111  Pulse: 63 78  Resp: 15 (!) 21  Temp:    SpO2: 97% 97%     General: Awake, interactive  CV:  Regular rate, good peripheral perfusion.  Resp:  Lungs clear, unlabored respirations.  Abd:  Soft, nondistended.  Neuro:   Keenly aware, correctly answers month and age, able to blink eyes and squeeze hands, normal extraocular movements, no field cut.  There is drift of the left arm and leg, does not touch the bed, baseline per patient, baseline limb ataxia of left arm.  Sensation is diminished throughout the left arm, leg, face, but at baseline per patient.  No aphasia, dysarthria, inattention.  ED  Results / Procedures / Treatments   Labs (all labs ordered are listed, but only abnormal results are displayed) Labs Reviewed  COMPREHENSIVE METABOLIC PANEL - Abnormal; Notable for the following components:      Result Value   Glucose, Bld 101 (*)    All other components within normal limits  CBC WITH DIFFERENTIAL/PLATELET     EKG EKG independently reviewed interpreted by myself (ER attending) demonstrates:  EKG demonstrate sinus rhythm at a rate of 68, PR 152, QRS 90, QTc 423, no acute ST changes  RADIOLOGY Imaging independently reviewed and interpreted by myself demonstrates:    PROCEDURES:  Critical Care performed: No  Procedures   MEDICATIONS ORDERED IN ED: Medications  gabapentin (NEURONTIN) capsule 400 mg (400 mg Oral Given 08/31/22 1348)     IMPRESSION / MDM / ASSESSMENT AND PLAN / ED COURSE  I reviewed the triage vital signs and the nursing notes.  Differential diagnosis includes, but is not limited to, recrudescence of prior stroke, new CVA, TIA  Patient's presentation is most consistent with acute presentation with potential threat to life or bodily function.  67 year old female  presenting with transient episode of increased numbness over her left face and foot.  Back to baseline paresthesias currently.  Presentation seems most reflective of a TIA.  Will obtain labs.  Does have a contrast allergy documented as diarrhea, but patient reports shortness of breath with this so we will obtain noncontrast CT.  Clinical Course as of 08/31/22 1556  Sat Aug 31, 2022  1415 CT head and labs reassuring. Case discussed with Dr. Wilford Corner. Recommends MRI in ER. If significant ischemic stroke, recommends admission for further evaluation.  However if this is reassuring, patient can be discharged for continued outpatient follow-up. [NR]  1515 Signed out to oncoming physician pending MRI, reevaluation, and disposition. [NR]    Clinical Course User Index [NR] Trinna Post, MD      FINAL CLINICAL IMPRESSION(S) / ED DIAGNOSES   Final diagnoses:  Paresthesia     Rx / DC Orders   ED Discharge Orders     None        Note:  This document was prepared using Dragon voice recognition software and may include unintentional dictation errors.   Trinna Post, MD 08/31/22 1556

## 2022-09-02 ENCOUNTER — Encounter: Payer: Self-pay | Admitting: Occupational Therapy

## 2022-09-02 ENCOUNTER — Ambulatory Visit: Payer: Medicare Other | Admitting: Physical Therapy

## 2022-09-02 ENCOUNTER — Telehealth: Payer: Self-pay

## 2022-09-02 ENCOUNTER — Ambulatory Visit: Payer: Medicare Other | Admitting: Occupational Therapy

## 2022-09-02 DIAGNOSIS — I69154 Hemiplegia and 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 left non-dominant side: Secondary | ICD-10-CM

## 2022-09-02 DIAGNOSIS — I69318 Other symptoms and signs involving cognitive functions following cerebral infarction: Secondary | ICD-10-CM

## 2022-09-02 DIAGNOSIS — M6281 Muscle weakness (generalized): Secondary | ICD-10-CM

## 2022-09-02 DIAGNOSIS — R2689 Other abnormalities of gait and mobility: Secondary | ICD-10-CM | POA: Diagnosis not present

## 2022-09-02 DIAGNOSIS — R29818 Other symptoms and signs involving the nervous system: Secondary | ICD-10-CM

## 2022-09-02 DIAGNOSIS — R2681 Unsteadiness on feet: Secondary | ICD-10-CM

## 2022-09-02 DIAGNOSIS — R208 Other disturbances of skin sensation: Secondary | ICD-10-CM

## 2022-09-02 DIAGNOSIS — R278 Other lack of coordination: Secondary | ICD-10-CM

## 2022-09-02 NOTE — Telephone Encounter (Signed)
Patient called in stating that she was seen in the ER on Sunday at [facility] and said she was having symptoms of a stroke and the CT scans came back negative and she wants to know if she needs to have a follow up with you regarding the hospital visit.

## 2022-09-02 NOTE — Therapy (Signed)
OUTPATIENT OCCUPATIONAL THERAPY NEURO Treatment   Patient Name: Charlotte Hunter MRN: [id number] DOB:[birth year], 67 y.o., female Today's Date: 09/02/2022  PCP: Dr. Syble Creek REFERRING PROVIDER: Dr. 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  Rationale for Evaluation and Treatment: Rehabilitation  SUBJECTIVE:   SUBJECTIVE STATEMENT:  Pt accompanied by: dtr  PERTINENT HISTORY: 67 year old female here for evaluation of right thalamic intracerebral hemorrhage. Patient is was admitted to the hospital in June 2023 for new onset left-sided weakness headache and nausea. She was found to have intracerebral hemorrhage in the right thalamus. Patient was on Eliquis anticoagulation for atrial fibrillation at the time and this was reversed medically. She was started on hypertonic saline, blood pressure control in ICU admission. Patient went to inpatient rehabilitation. Now patient back home. Still has left-sided weakness and left hand and arm and leg pain. Has been to cardiology who recommended to hold anticoagulation for  now as patient is not in atrial fibrillation.    Past medical history of atrial fibrillation on Eliquis aortic insufficiency, hypertension, fibromyalgia Pt received OT at [address] And was d/c in February PRECAUTIONS: Fall  WEIGHT BEARING RESTRICTIONS: No  PAIN:  Are you having pain? Yes: NPRS scale: 5/10 Pain location: left hand  Pain description: sharp, burning Aggravating factors: use Relieving factors: meds, rest  FALLS: Has patient fallen in last 6 months? 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  PLOF: Independent  PATIENT GOALS: continue to increase functional use of LUE  OBJECTIVE:   HAND DOMINANCE: Right  ADLs: Overall ADLs: Pt is performing basic ADLs mod I  Transfers/ambulation related to ADLs: Eating: mod I Grooming: mod I UB Dressing: mod I LB Dressing: mod I Toileting: mod I uses walker and w/c Bathing: mod I has tub bench Tub Shower transfers: mod I Equipment: Transfer tub bench and bed side commode  IADLs: Shopping: dtr performs the grocery shopping  Light housekeeping: pt vacuums seated in w/c  Meal Prep: uses oven mod I Community mobility: supervision with walker, alos has w/c Medication management: mod I Financial management: mod I   MOBILITY STATUS:  supervision to mod I with walker    ACTIVITY TOLERANCE: Activity tolerance: Pt fatigues quickly with ambulation    UPPER EXTREMITY ROM:  RUE WFLS, LUE pt has full composite flexion. Pt has hyperextension at PIP joint and flexion at DIP for ring finger  Active ROM Right eval Left eval  Shoulder flexion  80  with elbow flexed  Shoulder abduction    Shoulder adduction    Shoulder extension    Shoulder internal rotation    Shoulder external rotation    Elbow flexion  WFLS  Elbow extension  -15  Wrist flexion  WFLs  Wrist extension    Wrist ulnar deviation    Wrist radial  deviation    Wrist pronation  WFLS  Wrist supination  WFLS  (Blank rows = not tested)  HAND FUNCTION: Grip strength: Right: 65 lbs; Left: 18 lbs  COORDINATION: 9 Hole Peg test: Right: NT sec; Left: 3 pegs placed in 2 mins sec  SENSATION: LUE  near absent sensation  EDEMA: n/a  MUSCLE TONE: LUE: Mild and Hypertonic  COGNITION: Overall cognitive status: Within functional limits for tasks assessed    OBSERVATIONS: Pt became fatigued when walking to [place] requiring a w/c for rest. Pt was assisted into her friends car and she was doing ok when she left.   TODAY'S TREATMENT:                                                                                                                              DATE: 09/02/22-Pt arrived in w/c for OT treatment. She reports going to the ED over the weekend with CVA symptoms.  Pt's testing was negative for CVA, and she was told that stress could bring on or amplify old CVA symptoms. BP 158/94 initially. Therapist encouraged pt to consider meeting with a counselor to help manage her stress. Pt is having stressful interactions with her dtr. but she reports she is in a safe environment. Pt's dtr is moving out in June. Deep breathing exercise performed, pt was encouraged to perform at home. Seated at the table pt practiced opening various sized containers using LUE to assist, min v.c and mod difficulty. Placing and removing large wooden dowel pegs with LUE, min difficulty removing, mod-max with replacing due to sensory deficits. Pt's BP remained elevated at end of session 148/93 so her PT session at the pool was cancelled after discussion with Rosalita Chessman Dilday PT.   08/26/22 supine closed chain shoulder flexion with PVC pipe frame, min v.c and facilitation Seated closed chain reaching for the floor with bilateral UE's min v.c Seated and standing, UE ranger for shoulder flexion and circumduction, min v.c / min-mod facilitation Seated weightbearing through Left  elbow, min facilitation/ v.c Functional reaching into eye level cabinets to place and remove lightweight items, mod difficulty, min facilitation/ v.c supervision for safety.   HOME EXERCISE PROGRAM: Not issued yet   GOALS: Goals reviewed with patient? No  SHORT TERM GOALS: Target date: 09/18/22  I with HEP  Goal status: INITIAL  2.  Pt will demonstrate improved LUE functional use as evidenced by increasing increase box and blocks score by 3 blocks  Goal status: INITIAL  3.  Pt will verbalize understanding of AE/ DME options to increase safety and independence with ADLs/IADLs.  Goal status:  INITIAL  4.  Pt will demonstrate LUE shoulder flexion to  85* with min compensation for improved functional reach.  Goal status: INITIAL  5.  Pt will utilize LUE as a gross assist for ADLs at least 50% of the time with pain no greater than 4/10.  Goal status: INITIAL    LONG TERM GOALS: Target date: 11/17/22  I with updated HEP.  Goal status: INITIAL  2.  Pt will report ability to perform simple garden task with AE/DME prn and supervision, following assist for set up.  Goal status: INITIAL  3.  Pt will demonstrate 90* shoulder flexion with min compensation for increased LUE functional use. Goal status: INITIAL  4.  Pt will demonstrate improved LUE functional use as evidenced by increasing increase box and blocks score by 6 blocks from eval  Goal status: INITIAL   ASSESSMENT:  CLINICAL IMPRESSION: Patient is progressing towards goals . She was limited by elevated BP today and sensation remains absent which impedes functional use.Marland Kitchen PERFORMANCE DEFICITS: in functional skills including ADLs, IADLs, coordination, dexterity, sensation, tone, ROM, strength, pain, flexibility, Fine motor control, Gross motor control, mobility, endurance, decreased knowledge of precautions, decreased knowledge of use of DME, and UE functional use, cognitive skills including safety awareness, and  psychosocial skills including coping strategies, environmental adaptation, habits, interpersonal interactions, and routines and behaviors.   IMPAIRMENTS: are limiting patient from ADLs, IADLs, rest and sleep, work, play, leisure, and social participation.   CO-MORBIDITIES: may have co-morbidities  that affects occupational performance. Patient will benefit from skilled OT to address above impairments and improve overall function.  MODIFICATION OR ASSISTANCE TO COMPLETE EVALUATION: No modification of tasks or assist necessary to complete an evaluation.  OT OCCUPATIONAL PROFILE AND HISTORY: Detailed assessment: Review of records and additional review of physical, cognitive, psychosocial history related to current functional performance.  CLINICAL DECISION MAKING: LOW - limited treatment options, no task modification necessary  REHAB POTENTIAL: Good  EVALUATION COMPLEXITY: Low    PLAN:  OT FREQUENCY: 1x/week  OT DURATION: 12 weeks plus eval, may d/c after 8 weeks dependent on progress.  PLANNED INTERVENTIONS: self care/ADL training, therapeutic exercise, therapeutic activity, neuromuscular re-education, manual therapy, passive range of motion, balance training, functional mobility training, aquatic therapy, splinting, ultrasound, paraffin, moist heat, cryotherapy, contrast bath, patient/family education, cognitive remediation/compensation, energy conservation, coping strategies training, DME and/or AE instructions, and Re-evaluation  RECOMMENDED OTHER SERVICES: n/a  CONSULTED AND AGREED WITH PLAN OF CARE: Patient  PLAN FOR NEXT SESSION:  supine closed chain, UE ranger, monitor BP, consider updating HEP for coordination Ask pt is she wants to schedule at 1145 on 09/23/22 as she may have 2 pm pool, ? add visits 1x week through June. Pt is wrapping up with PT at [address].   Findley Vi, OT 09/02/2022, 12:13 PM

## 2022-09-11 ENCOUNTER — Ambulatory Visit: Payer: Medicare Other | Admitting: Physical Therapy

## 2022-09-11 ENCOUNTER — Ambulatory Visit: Payer: Medicare Other | Admitting: Occupational Therapy

## 2022-09-11 VITALS — BP 155/84 | HR 80

## 2022-09-11 DIAGNOSIS — M6281 Muscle weakness (generalized): Secondary | ICD-10-CM

## 2022-09-11 DIAGNOSIS — R278 Other lack of coordination: Secondary | ICD-10-CM

## 2022-09-11 DIAGNOSIS — R2681 Unsteadiness on feet: Secondary | ICD-10-CM

## 2022-09-11 DIAGNOSIS — R2689 Other abnormalities of gait and mobility: Secondary | ICD-10-CM

## 2022-09-11 DIAGNOSIS — I69154 Hemiplegia and 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 left non-dominant side: Secondary | ICD-10-CM

## 2022-09-11 NOTE — Therapy (Signed)
OUTPATIENT PHYSICAL THERAPY NEURO TREATMENT NOTE-RECERTIFICATION***        Patient Name: Charlotte Hunter MRN: [id number] DOB:06/05/1955, 67 y.o., female Today's Date: 09/11/2022   PCP: Audery Amel, MD REFERRING PROVIDER: Jones Bales, NP     PT End of Session - 09/11/22 1150     Visit Number 61    Number of Visits 62   renewal completed for 8 additional visits   Date for PT Re-Evaluation 09/06/22    Authorization Type Medicare    Authorization Time Period 01-03-22 - 04-03-22;  04-01-22 - 06-13-22; 06-03-22 - 08-13-22; 07-29-22 - 09-20-22    Progress Note Due on Visit 50    PT Start Time 1144    PT Stop Time 1238    PT Time Calculation (min) 54 min    Equipment Utilized During Treatment Gait belt    Activity Tolerance Patient tolerated treatment well    Behavior During Therapy WFL for tasks assessed/performed                                     Past Medical History:  Diagnosis Date   A-fib [facility])    Aortic insufficiency    Hypertension    Stroke [facility])    Past Surgical History:  Procedure Laterality Date   ABDOMINAL HYSTERECTOMY     ABLATION  05/03/2021   heart   c section  01/1968   KNEE SURGERY Right    meniscus   SHOULDER SURGERY Right 08/2014   Patient Active Problem List   Diagnosis Date Noted   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   ICH (intracerebral hemorrhage) ([name]) 10/01/2021   Paroxysmal atrial fibrillation ([name]) 09/29/2021   Stenosis of intracranial vessel 09/29/2021   Essential hypertension 09/29/2021   Blurry vision 09/29/2021   GERD (gastroesophageal reflux disease) 09/29/2021   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                                                                                                                                                                                Pt  accompanied by: friend, Clydie Braun  PERTINENT HISTORY: 67 year old female here for evaluation of right thalamic intracerebral hemorrhage. Patient is was admitted to the hospital in June 2023 for new onset left-sided weakness headache and nausea. She was found to have intracerebral hemorrhage in the right thalamus. Patient was on Eliquis anticoagulation for atrial fibrillation at the time and this was reversed medically. She was started on hypertonic saline, blood pressure control in ICU admission. Patient went to inpatient rehabilitation. Now patient back home. Still has left-sided weakness and left hand and arm and leg pain. Has been to cardiology who recommended to hold anticoagulation for now as patient is not in atrial fibrillation.   Past medical history of atrial fibrillation on Eliquis aortic insufficiency, hypertension, fibromyalgia  PAIN:  Are you having pain? Yes Location: Lt side of face & chin, edge of Lt arm (lateral side), foot and ankle and Lt buttock  Intensity: 1-2/10  Nerve pain, occasional shock sensation, "rope-like sensation";   04-09-22: not so much in pain but more numbness in Lt side of face - states numbness is moving out    PRECAUTIONS: Fall  WEIGHT BEARING RESTRICTIONS No  FALLS: Has patient fallen in last 6 months? No  LIVING ENVIRONMENT: Lives with: daughter, [name] Lives in: House/apartment Stairs: No - Ramp has been built at front entrance Has following equipment at home: Child psychotherapist, Environmental consultant - 2 wheeled, and bed side commode  PLOF: Independent; pt did gardening prior to CVA - was not employed prior to onset of CVA  PATIENT GOALS "be restored back to where I was"   OBJECTIVE:    TODAY'S TREATMENT: Gait:  RAMP:  Level of Assistance: SBA and Min A Assistive device utilized: Environmental consultant - 2 wheeled Ramp Comments: pt requesting assist to stabilize RW when descending ramp, SBA for ascent   STAIRS:  Level of Assistance: SBA  Stair Negotiation Technique: Step to Pattern with Single Rail on Right  Number of Stairs: 4   Height of Stairs: 6  Comments: some circumduction of LLE due to decreased knee flexion   GAIT: Gait pattern: genu recurvatum- Left Distance walked: 115 ft Assistive device utilized: Quad cane large base Level of assistance: Modified independence Comments: mild L knee genu recurvatum; with use of LLE AFO      TherEx:  Access Code: [email or web address] - issued on 01-17-22 URL: [email or web address] Date: 01/17/2022 Prepared by: Maebelle Munroe  Exercises - Seated Soleus Stretch  - 2-3 x daily - 7 x weekly - 1 sets - 2 reps - 15-20 secs  hold - Slant Board Gastrocnemius Stretch  - 3 x daily - 7 x weekly - 1 sets - 2 reps - 15-20 secs hold - Seated Gastroc Stretch with Strap  - 2-3 x daily - 7 x weekly - 1 sets - 2 reps - 15-20 hold   PATIENT EDUCATION:   Education details: PT POC and to keep trying new things at home as long as they are safe Person educated: Patient Education method: Explanation, Demonstration Education comprehension: verbalized understanding, demonstration   HOME EXERCISE PROGRAM:  Previously issued Access Code: [email or web address] URL: [email or web address] Date: 01/08/2022 Prepared by: Maebelle Munroe  Exercises - Bridge  - 1 x daily - 7 x weekly - 1 sets - 10 reps - 5 hold - Single Leg Bridge  - 1 x daily - 7 x weekly - 1 sets - 10 reps - 3 sec hold - Hooklying Clamshell with Resistance  - 1 x daily - 7  x  weekly - 1 sets - 10 reps - 2-3 sec hold - Supine Hip Flexion with Resistance Loop  - 1 x daily - 7 x weekly - 3 sets - 10 reps - Supine Heel Slide  - 1 x daily - 7 x weekly - 3 sets - 10 reps - Stepping out/in with LEFT leg  - 1 x daily - 7 x weekly - 3 sets - 10 reps - Hooklying Hamstring Stretch with Strap  - 1 x daily - 7 x weekly - 1 sets - 1-2 reps - 20 sec hold  GOALS: Goals reviewed with patient? Yes    NEW SHORT TERM GOALS:  TARGET DATE 08-23-22   Pt will amb. 68' with [facility] with SBA for increased household ambulation with this device.  Baseline;  pt reports using RW for assistance with household amb; 115 ft with [facility] and CGA (5/8)  Goal status:  IN PROGRESS  2.  Amb. 250' with RW with SBA on flat, even surface to demo improved endurance.  Baseline:  62' with RW (5/8)  Goal status:  IN PROGRESS  3. Pt will increase Berg balance test score to >/= 41/56 to reduce fall risk.  Baseline:  33/56 on 04-01-22; Berg score 34/56 on 05[birth year]08[birth year]54; 36/56 (4/10) Goal status: IN PROGRESS/ONGOING  4.  Pt will improve TUG score to </= 43 secs with RW to demo improved functional mobility and reduced fall risk.  Baseline: 2 min with SBQC:  59 sec with RW mod I (2/21), 49 sec with RW mod I (4/10) Goal status: IN PROGRESS/ONGOING  5.  Pt will be able to pick up object off floor from standing position without UE support with SBA to demo improved balance with bending/leaning forward and returning to upright position.    Baseline:  unable to pick up object off floor without UE support, still requires UE support (5/8)            Goal status:  IN PROGRESS    6.  Pt will report ability to ambulate up and down ramp with RW at her home modified independently.   Baseline:  currently able to walk up ramp but not down the ramp, still needs SBA for descent (5/8) Goal status:  IN PROGRESS  7.  Pt will be able to kneel on Kay bench with min assist and return to standing.            Baseline:  TBA             Goal status:  NEW      NEW LONG TERM GOALS:  TARGET DATE: 09-13-22  Pt will report negotiating steps at her home with use of handrail modified independently.  Baseline:  has negotiated steps with SBA from daughter, pt continues to be SBA with handrail (5/29) Goal status:  NOT MET  2.   Independent in aquatic HEP to be continued upon discharge from PT.            Baseline:  in progress Goal status:  NEW      ASSESSMENT:  CLINICAL IMPRESSION: Emphasis of skilled PT session on assessing STG for last land PT visits during this POC. Pt had made progress over the past several weeks since her last land physical therapy visit and has been pushing herself at home outside of her comfort zone to use her [facility] more, walk outdoors across her grass, navigate up/down her ramp and up/down her stairs, and even cooked a meal for herself while standing  at the stove. Pt exhibits good awareness of her deficits with good safety awareness overall. Pt encouraged by her recent progress and plans to continue to try new things at home after d/c from therapy. Pt understanding that this is her final land therapy visits in this POC, continues to benefit from skilled aquatic physical therapy services to become independent with her aquatic HEP. Continue POC. ***     OBJECTIVE IMPAIRMENTS Abnormal gait, decreased activity tolerance, decreased balance, decreased coordination, decreased endurance, decreased knowledge of use of DME, decreased ROM, decreased strength, impaired sensation, impaired tone, and impaired UE functional use.   ACTIVITY LIMITATIONS carrying, lifting, bending, standing, squatting, stairs, transfers, bed mobility, and locomotion level  PARTICIPATION LIMITATIONS: meal prep, cleaning, laundry, driving, shopping, community activity, and yard work  PERSONAL FACTORS Fitness and severity of deficits  are also affecting patient's functional outcome.   REHAB POTENTIAL: Good  CLINICAL DECISION MAKING:  Evolving/moderate complexity  EVALUATION COMPLEXITY: Moderate  PLAN: PT FREQUENCY: 2x/week for 6 weeks (4 land visits/4 aquatic PT visits during this 6 week time period)***  PT DURATION:  6 weeks  PLANNED INTERVENTIONS: Therapeutic exercises, Therapeutic activity, Neuromuscular re-education, Balance training, Gait training, Patient/Family education, Self Care, Stair training, Orthotic/Fit training, DME instructions, Aquatic Therapy, and Wheelchair mobility training  PLAN FOR NEXT SESSION: aquatic HEP   Peter Congo, PT, DPT, CSRS  [facility] [address] [place], [place] [address] 09/11/2022, 12:38 PM

## 2022-09-16 ENCOUNTER — Ambulatory Visit: Payer: Medicare Other | Attending: Physical Medicine & Rehabilitation | Admitting: Occupational Therapy

## 2022-09-16 ENCOUNTER — Ambulatory Visit: Payer: Medicare Other | Admitting: Physical Therapy

## 2022-09-16 DIAGNOSIS — M6281 Muscle weakness (generalized): Secondary | ICD-10-CM | POA: Insufficient documentation

## 2022-09-16 DIAGNOSIS — R2681 Unsteadiness on feet: Secondary | ICD-10-CM | POA: Diagnosis present

## 2022-09-16 DIAGNOSIS — R29818 Other symptoms and signs involving the nervous system: Secondary | ICD-10-CM | POA: Insufficient documentation

## 2022-09-16 DIAGNOSIS — R278 Other lack of coordination: Secondary | ICD-10-CM | POA: Insufficient documentation

## 2022-09-16 DIAGNOSIS — R2689 Other abnormalities of gait and mobility: Secondary | ICD-10-CM | POA: Diagnosis present

## 2022-09-16 DIAGNOSIS — I69154 Hemiplegia and 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 left non-dominant side: Secondary | ICD-10-CM | POA: Diagnosis present

## 2022-09-16 DIAGNOSIS — I69318 Other symptoms and signs involving cognitive functions following cerebral infarction: Secondary | ICD-10-CM | POA: Insufficient documentation

## 2022-09-16 NOTE — Therapy (Signed)
OUTPATIENT OCCUPATIONAL THERAPY NEURO Treatment   Patient Name: Charlotte Hunter MRN: [id number] DOB:11-02-1955, 67 y.o., female Today's Date: 09/17/2022  PCP: Dr. Syble Creek REFERRING PROVIDER: Dr. Wynn Banker   END OF SESSION:  OT End of Session - 09/16/22 1243     Visit Number 4    Number of Visits 13    Date for OT Re-Evaluation 11/17/22    Authorization Type MCR [employer]    Authorization - Visit Number 4    Authorization - Number of Visits 10    Progress Note Due on Visit 10    OT Start Time 1235    OT Stop Time 1320    OT Time Calculation (min) 45 min               Past Medical History:  Diagnosis Date   A-fib ([name])    Aortic insufficiency    Hypertension    Stroke [facility])    Past Surgical History:  Procedure Laterality Date   ABDOMINAL HYSTERECTOMY     ABLATION  05/03/2021   heart   c section  01/1968   KNEE SURGERY Right    meniscus   SHOULDER SURGERY Right 08/2014   Patient Active Problem List   Diagnosis Date Noted   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   ICH (intracerebral hemorrhage) ([name]) 10/01/2021   Paroxysmal atrial fibrillation ([name]) 09/29/2021   Stenosis of intracranial vessel 09/29/2021   Essential hypertension 09/29/2021   Blurry vision 09/29/2021   GERD (gastroesophageal reflux disease) 09/29/2021   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 09/25/2021    ONSET DATE: 07/11/22- referral date  REFERRING DIAG:  Diagnosis  I61.9 (ICD-10-CM) - Hemorrhagic stroke ([name])    THERAPY DIAG:  Muscle weakness (generalized)  Other lack of coordination  Unsteadiness on feet  Other abnormalities of gait and mobility  Hemiplegia and 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 left non-dominant side ([name])  Other symptoms and signs involving the nervous system  Other symptoms and signs involving cognitive functions following cerebral infarction  Rationale for Evaluation and Treatment:  Rehabilitation  SUBJECTIVE:   SUBJECTIVE STATEMENT:  Pt reports cooking at home  PERTINENT HISTORY: 67 year old female here for evaluation of right thalamic intracerebral hemorrhage. Patient is was admitted to the hospital in June 2023 for new onset left-sided weakness headache and nausea. She was found to have intracerebral hemorrhage in the right thalamus. Patient was on Eliquis anticoagulation for atrial fibrillation at the time and this was reversed medically. She was started on hypertonic saline, blood pressure control in ICU admission. Patient went to inpatient rehabilitation. Now patient back home. Still has left-sided weakness and left hand and arm and leg pain. Has been to cardiology who recommended to hold anticoagulation for now as patient is not in atrial fibrillation.    Past medical history of atrial fibrillation on Eliquis aortic insufficiency, hypertension, fibromyalgia Pt received OT at [address] And was d/c in February PRECAUTIONS: Fall  WEIGHT BEARING RESTRICTIONS: No  PAIN:  Are you having pain? Yes: NPRS scale: 5/10 Pain location: left hand  Pain description: sharp, burning Aggravating factors: use Relieving factors: meds, rest  FALLS: Has patient fallen in last 6 months? 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  PLOF: Independent  PATIENT GOALS: continue to increase functional use of LUE  OBJECTIVE:   HAND DOMINANCE: Right  ADLs: Overall ADLs: Pt is performing basic ADLs mod I  Transfers/ambulation related to ADLs: Eating: mod I Grooming: mod I UB Dressing: mod I LB Dressing: mod I Toileting: mod I uses walker and w/c Bathing: mod I has tub bench Tub Shower transfers: mod I Equipment: Transfer tub bench and bed side commode  IADLs: Shopping: dtr performs the grocery shopping  Light housekeeping: pt vacuums seated in w/c  Meal Prep: uses oven  mod I Community mobility: supervision with walker, alos has w/c Medication management: mod I Financial management: mod I   MOBILITY STATUS:  supervision to mod I with walker    ACTIVITY TOLERANCE: Activity tolerance: Pt fatigues quickly with ambulation    UPPER EXTREMITY ROM:  RUE WFLS, LUE pt has full composite flexion. Pt has hyperextension at PIP joint and flexion at DIP for ring finger  Active ROM Right eval Left eval  Shoulder flexion  80 with elbow flexed  Shoulder abduction    Shoulder adduction    Shoulder extension    Shoulder internal rotation    Shoulder external rotation    Elbow flexion  WFLS  Elbow extension  -15  Wrist flexion  WFLs  Wrist extension    Wrist ulnar deviation    Wrist radial deviation    Wrist pronation  WFLS  Wrist supination  WFLS  (Blank rows = not tested)  HAND FUNCTION: Grip strength: Right: 65 lbs; Left: 18 lbs  COORDINATION: 9 Hole Peg test: Right: NT sec; Left: 3 pegs placed in 2 mins sec  SENSATION: LUE  near absent sensation  EDEMA: n/a  MUSCLE TONE: LUE: Mild and Hypertonic  COGNITION: Overall cognitive status: Within functional limits for tasks assessed    OBSERVATIONS: Pt became fatigued when walking to [facility] requiring a w/c for rest. Pt was assisted into her friends car and she was doing ok when she left.   TODAY'S TREATMENT:                                                                                                                              DATE: 09/17/22-BP on arrival after walking from waiting room with close supervision was 156/99. Pt was provided with water and a rest break. Pt reports continued stress at home with her dtr. Pt was encouraged to consider a counselor. After rest break BP 151/87 Supine closed chain shoulder flexion and chest press with min-mod facilitation. Seated pt held a vertical pole with LUE to perform shoulder flexion and circumduction, min facilitation/ v.c Oval 8 splints were tried  on index(size 7) and middle fingers (size 8) due to hyperextension at PIP joints, with pt developing swan neck deformity. Therpist to provide information for purchase next visit    09/02/22-Pt arrived in w/c for OT treatment. She reports going to the ED over the weekend with CVA symptoms.  Pt's testing was negative for CVA, and she was told that stress could bring on or amplify old CVA symptoms. BP 158/94 initially. Therapist encouraged pt to consider meeting with  a counselor to help manage her stress. Pt is having stressful interactions with her dtr. but she reports she is in a safe environment. Pt's dtr is moving out in June. Deep breathing exercise performed, pt was encouraged to perform at home. Seated at the table pt practiced opening various sized containers using LUE to assist, min v.c and mod difficulty. Placing and removing large wooden dowel pegs with LUE, min difficulty removing, mod-max with replacing due to sensory deficits. Pt's BP remained elevated at end of session 148/93 so her PT session at the pool was cancelled after discussion with Rosalita Chessman Dilday PT.   08/26/22 supine closed chain shoulder flexion with PVC pipe frame, min v.c and facilitation Seated closed chain reaching for the floor with bilateral UE's min v.c Seated and standing, UE ranger for shoulder flexion and circumduction, min v.c / min-mod facilitation Seated weightbearing through Left elbow, min facilitation/ v.c Functional reaching into eye level cabinets to place and remove lightweight items, mod difficulty, min facilitation/ v.c supervision for safety.   HOME EXERCISE PROGRAM: Not issued yet   GOALS: Goals reviewed with patient? No  SHORT TERM GOALS: Target date: 09/18/22  I with HEP  Goal status: INITIAL  2.  Pt will demonstrate improved LUE functional use as evidenced by increasing increase box and blocks score by 3 blocks  Goal status: INITIAL  3.  Pt will verbalize understanding of AE/ DME options  to increase safety and independence with ADLs/IADLs.  Goal status: INITIAL  4.  Pt will demonstrate LUE shoulder flexion to  85* with min compensation for improved functional reach.  Goal status: INITIAL  5.  Pt will utilize LUE as a gross assist for ADLs at least 50% of the time with pain no greater than 4/10.  Goal status: INITIAL    LONG TERM GOALS: Target date: 11/17/22  I with updated HEP.  Goal status: INITIAL  2.  Pt will report ability to perform simple garden task with AE/DME prn and supervision, following assist for set up.  Goal status: INITIAL  3.  Pt will demonstrate 90* shoulder flexion with min compensation for increased LUE functional use. Goal status: INITIAL  4.  Pt will demonstrate improved LUE functional use as evidenced by increasing increase box and blocks score by 6 blocks from eval  Goal status: INITIAL   ASSESSMENT:  CLINICAL IMPRESSION: Patient is progressing towards goals. She reports cooking and walking more at home. She demonstrates improving LUE functional use. PERFORMANCE DEFICITS: in functional skills including ADLs, IADLs, coordination, dexterity, sensation, tone, ROM, strength, pain, flexibility, Fine motor control, Gross motor control, mobility, endurance, decreased knowledge of precautions, decreased knowledge of use of DME, and UE functional use, cognitive skills including safety awareness, and psychosocial skills including coping strategies, environmental adaptation, habits, interpersonal interactions, and routines and behaviors.   IMPAIRMENTS: are limiting patient from ADLs, IADLs, rest and sleep, work, play, leisure, and social participation.   CO-MORBIDITIES: may have co-morbidities  that affects occupational performance. Patient will benefit from skilled OT to address above impairments and improve overall function.  MODIFICATION OR ASSISTANCE TO COMPLETE EVALUATION: No modification of tasks or assist necessary to complete an  evaluation.  OT OCCUPATIONAL PROFILE AND HISTORY: Detailed assessment: Review of records and additional review of physical, cognitive, psychosocial history related to current functional performance.  CLINICAL DECISION MAKING: LOW - limited treatment options, no task modification necessary  REHAB POTENTIAL: Good  EVALUATION COMPLEXITY: Low    PLAN:  OT FREQUENCY: 1x/week  OT DURATION:  12 weeks plus eval, may d/c after 8 weeks dependent on progress.  PLANNED INTERVENTIONS: self care/ADL training, therapeutic exercise, therapeutic activity, neuromuscular re-education, manual therapy, passive range of motion, balance training, functional mobility training, aquatic therapy, splinting, ultrasound, paraffin, moist heat, cryotherapy, contrast bath, patient/family education, cognitive remediation/compensation, energy conservation, coping strategies training, DME and/or AE instructions, and Re-evaluation  RECOMMENDED OTHER SERVICES: n/a  CONSULTED AND AGREED WITH PLAN OF CARE: Patient  PLAN FOR NEXT SESSION:   Check short term goals.   Denis Koppel, OT 09/17/2022, 4:21 PM

## 2022-09-17 ENCOUNTER — Encounter: Payer: Self-pay | Admitting: Physical Therapy

## 2022-09-17 NOTE — Therapy (Signed)
OUTPATIENT PHYSICAL THERAPY NEURO TREATMENT NOTE/AQUATIC THERAPY       Patient Name: Charlotte Hunter MRN: [id number] DOB:01-Nov-1955, 67 y.o., female Today's Date: 09/17/2022   PCP: Audery Amel, MD REFERRING PROVIDER: Jones Bales, NP     PT End of Session - 09/17/22 1940     Visit Number 62    Number of Visits 64   renewal completed for 8 additional visits   Date for PT Re-Evaluation 10/11/22    Authorization Type Medicare    Authorization Time Period 01-03-22 - 04-03-22;  04-01-22 - 06-13-22; 06-03-22 - 08-13-22; 07-29-22 - 09-20-22;  09-11-22 - 10-11-22    Progress Note Due on Visit 60    PT Start Time 1400    PT Stop Time 1445    PT Time Calculation (min) 45 min    Equipment Utilized During Treatment Other (comment)   aquatic cuffs, bar bells   Activity Tolerance Patient tolerated treatment well    Behavior During Therapy [facility] for tasks assessed/performed                                    Past Medical History:  Diagnosis Date   A-fib [facility])    Aortic insufficiency    Hypertension    Stroke [facility])    Past Surgical History:  Procedure Laterality Date   ABDOMINAL HYSTERECTOMY     ABLATION  05/03/2021   heart   c section  01/1968   KNEE SURGERY Right    meniscus   SHOULDER SURGERY Right 08/2014   Patient Active Problem List   Diagnosis Date Noted   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   ICH (intracerebral hemorrhage) ([name]) 10/01/2021   Paroxysmal atrial fibrillation ([name]) 09/29/2021   Stenosis of intracranial vessel 09/29/2021   Essential hypertension 09/29/2021   Blurry vision 09/29/2021   GERD (gastroesophageal reflux disease) 09/29/2021   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 09/25/2021    ONSET DATE: 09-25-21  REFERRING DIAG: I61.9 (ICD-10-CM) - Hemorrhagic stroke ([name])   THERAPY DIAG:  Muscle weakness (generalized)  Unsteadiness on feet  Other abnormalities of gait and mobility  Hemiplegia and  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  Continues to do a lot at home now - has finished up with land PT  Past medical history of atrial fibrillation on Eliquis aortic insufficiency, hypertension, fibromyalgia  PAIN:  Are you having pain? Yes Location: Lt side of face & chin, edge of Lt arm (lateral side), foot and ankle and Lt buttock  Intensity: 1-2/10  Nerve pain, occasional shock sensation, "rope-like sensation";   04-09-22: not so much in pain but more numbness in Lt side of face - states numbness is moving out    PRECAUTIONS: Fall  WEIGHT BEARING RESTRICTIONS No  FALLS: Has patient fallen in last 6 months? No  LIVING ENVIRONMENT: Lives with: daughter, [name] Lives in: House/apartment Stairs: No - Ramp has been built at front entrance Has following equipment at home: Chiropractor, Environmental consultant - 2 wheeled, and bed side commode  PLOF: Independent; pt did gardening prior to CVA - was not employed prior to onset  of CVA  PATIENT GOALS "be restored back to where I was"   OBJECTIVE:    Aquatic therapy at [facility] - pool temp 90 degrees  Patient seen for aquatic therapy today.  Treatment took place in water 3.5-4.5 feet deep depending on activity.  Pt entered and exited the pool via step negotiation with use of bil. Hand rails with CGA using a step by step sequence  Pt performed water walking 18' x 4 reps with no device with SBA only; pt performed sideways amb. 18' x 2 reps and backwards 18' x 2 reps with CGA   Pt performed step ups with LLE onto aquatic step 10 reps with RUE support on pool edge; step down for Lt quad eccentric strengthening 10  reps with CGA and with cues to flex Lt knee prior to descension off step  Marching in place 10 reps each leg with CGA  Squats 10 reps bil. LE's  Pt performed Lt hip flexion, extension, abduction without use of aquatic cuff -  10 reps each direction - in standing:   Performed Lt knee extension/flexion in seated position - no aquatic cuff used - 10 reps with pt actively flexing Lt knee to 90 degrees  Pt requires buoyancy of water for support for joint unloading and for reduced fall risk with gait training without device than can be safely performed on land. Water current produces perturbations for challenge for static & dynamic standing balance.  Buoyancy of water provides unweighting which assists with pain reduction.     TherEx:  Access Code: [email or web address] - issued on 01-17-22 URL: [email or web address] Date: 01/17/2022 Prepared by: Maebelle Munroe  Exercises - Seated Soleus Stretch  - 2-3 x daily - 7 x weekly - 1 sets - 2 reps - 15-20 secs  hold - Slant Board Gastrocnemius Stretch  - 3 x daily - 7 x weekly - 1 sets - 2 reps - 15-20 secs hold - Seated Gastroc Stretch with Strap  - 2-3 x daily - 7 x weekly - 1 sets - 2 reps - 15-20 hold   PATIENT EDUCATION:   Education details: continue to practice gait with RW at home despite ongoing anxiety and fear of falling Person educated: Patient Education method: Explanation, Demonstration Education comprehension: verbalized understanding, demonstration   HOME EXERCISE PROGRAM:  Previously issued Access Code: [email or web address] URL: [email or web address] Date: 01/08/2022 Prepared by: Maebelle Munroe  Exercises - Bridge  - 1 x daily - 7 x weekly - 1 sets - 10 reps - 5 hold - Single Leg Bridge  - 1 x daily - 7 x weekly - 1 sets - 10 reps - 3 sec hold - Hooklying Clamshell with Resistance  - 1 x daily - 7 x weekly - 1 sets - 10 reps - 2-3 sec hold - Supine Hip Flexion with Resistance Loop  - 1 x daily - 7 x weekly - 3 sets - 10  reps - Supine Heel Slide  - 1 x daily - 7 x weekly - 3 sets - 10 reps - Stepping out/in with LEFT leg  - 1 x daily - 7 x weekly - 3 sets - 10 reps - Hooklying Hamstring Stretch with Strap  - 1 x daily - 7 x weekly - 1 sets - 1-2 reps - 20 sec hold  GOALS: Goals reviewed with patient? Yes    NEW SHORT TERM GOALS:  TARGET DATE 08-23-22   Pt will amb. 61' with [facility] with SBA for increased household ambulation with this device.  Baseline;  pt reports using RW for assistance with household amb; 115 ft with [facility] and CGA (5/8)  Goal status:  IN PROGRESS  2.  Amb. 250' with RW with SBA on flat, even surface to demo improved endurance.  Baseline:  47' with RW (5/8)  Goal status:  IN PROGRESS  3. Pt will increase Berg balance test score to >/= 41/56 to reduce fall risk.  Baseline:  33/56 on 04-01-22; Berg score 34/56 on 05-08[birth year]54; 36/56 (4/10) Goal status: IN PROGRESS/ONGOING  4.  Pt will improve TUG score to </= 43 secs with RW to demo improved functional mobility and reduced fall risk.  Baseline: 2 min with SBQC:  59 sec with RW mod I (2/21), 49 sec with RW mod I (4/10) Goal status: IN PROGRESS/ONGOING  5.  Pt will be able to pick up object off floor from standing position without UE support with SBA to demo improved balance with bending/leaning forward and returning to upright position.    Baseline:  unable to pick up object off floor without UE support, still requires UE support (5/8)            Goal status:  IN PROGRESS    6.  Pt will report ability to ambulate up and down ramp with RW at her home modified independently.   Baseline:  currently able to walk up ramp but not down the ramp, still needs SBA for descent (5/8) Goal status:  IN PROGRESS  7.  Pt will be able to kneel on Kay bench with min assist and return to standing.            Baseline:  TBA            Goal status: Goal deferred due to difficulty     NEW LONG TERM GOALS:  TARGET DATE: 09-13-22; new TARGET DATE 10-11-22 for  2 aquatic PT sessions    Pt will report negotiating steps at her home with use of handrail modified independently.  Baseline:  has negotiated steps with SBA from daughter, pt continues to be SBA with handrail (5/29) Goal status:  NOT MET  2.   Independent in aquatic HEP to be continued upon discharge from PT. Baseline:  in progress Goal status:  NEW     ASSESSMENT:  CLINICAL IMPRESSION: Pt finished land PT on 09-11-22;    Pt has partially met all STG's as pt has made progress but not fully achieved stated goals.  Pt did not met updated LTG #1 regarding step negotiation at her home independently.    Today's aquatic PT session focused on LLE strengthening exercises, gait training with pt wearing Lt AFO and balance training.  Pt continues to demonstrate improved balance with water walking with no UE support used.  Continue aquatic PT for 2 sessions to finalize and update aquatic HEP.     OBJECTIVE IMPAIRMENTS Abnormal gait, decreased activity tolerance, decreased balance, decreased coordination, decreased endurance, decreased knowledge of use of DME, decreased ROM, decreased strength, impaired sensation, impaired tone, and impaired UE functional use.   ACTIVITY LIMITATIONS carrying, lifting, bending, standing, squatting, stairs, transfers, bed mobility, and locomotion level  PARTICIPATION LIMITATIONS: meal prep, cleaning, laundry, driving, shopping, community activity, and yard work  PERSONAL FACTORS Fitness and severity of deficits  are also affecting patient's functional outcome.   REHAB POTENTIAL: Good  EVALUATION COMPLEXITY: Moderate  PLAN: PT FREQUENCY:  1x/week (for 3 aquatic PT visits)  PT DURATION:  3 weeks  PLANNED INTERVENTIONS: Therapeutic exercises, Therapeutic  activity, Neuromuscular re-education, Balance training, Gait training, Patient/Family education, Self Care, Stair training, Orthotic/Fit training, DME instructions, Aquatic Therapy, and Wheelchair mobility  training  PLAN FOR NEXT SESSION: Cont aquatic PT for 2 additional sessions   Gilliam Hawkes, Donavan Burnet, PT  [facility] [address] [place], [place] [address] 09/17/2022, 7:45 PM

## 2022-09-23 ENCOUNTER — Ambulatory Visit: Payer: Medicare Other | Admitting: Physical Therapy

## 2022-09-23 ENCOUNTER — Encounter: Payer: Self-pay | Admitting: Occupational Therapy

## 2022-09-23 ENCOUNTER — Ambulatory Visit: Payer: Medicare Other | Admitting: Occupational Therapy

## 2022-09-23 DIAGNOSIS — R278 Other lack of coordination: Secondary | ICD-10-CM

## 2022-09-23 DIAGNOSIS — R2681 Unsteadiness on feet: Secondary | ICD-10-CM

## 2022-09-23 DIAGNOSIS — R2689 Other abnormalities of gait and mobility: Secondary | ICD-10-CM

## 2022-09-23 DIAGNOSIS — R29818 Other symptoms and signs involving the nervous system: Secondary | ICD-10-CM

## 2022-09-23 DIAGNOSIS — I69318 Other symptoms and signs involving cognitive functions following cerebral infarction: Secondary | ICD-10-CM

## 2022-09-23 DIAGNOSIS — I69154 Hemiplegia and 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 left non-dominant side: Secondary | ICD-10-CM

## 2022-09-23 DIAGNOSIS — M6281 Muscle weakness (generalized): Secondary | ICD-10-CM

## 2022-09-23 NOTE — Therapy (Signed)
OUTPATIENT OCCUPATIONAL THERAPY NEURO Treatment   Patient Name: Charlotte Hunter MRN: [id number] DOB:30-Aug-1955, 67 y.o., female Today's Date: 09/23/2022  PCP: Dr. Syble Creek REFERRING PROVIDER: Dr. Wynn Banker   END OF SESSION:  OT End of Session - 09/23/22 1151     Visit Number 5    Number of Visits 13    Date for OT Re-Evaluation 11/17/22    Authorization Type MCR [employer]    Authorization - Visit Number 5    Authorization - Number of Visits 10    Progress Note Due on Visit 10    OT Start Time 1145    OT Stop Time 1229    OT Time Calculation (min) 44 min    Activity Tolerance Patient tolerated treatment well    Behavior During Therapy WFL for tasks assessed/performed                Past Medical History:  Diagnosis Date   A-fib ([name])    Aortic insufficiency    Hypertension    Stroke [facility])    Past Surgical History:  Procedure Laterality Date   ABDOMINAL HYSTERECTOMY     ABLATION  05/03/2021   heart   c section  01/1968   KNEE SURGERY Right    meniscus   SHOULDER SURGERY Right 08/2014   Patient Active Problem List   Diagnosis Date Noted   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   ICH (intracerebral hemorrhage) ([name]) 10/01/2021   Paroxysmal atrial fibrillation ([name]) 09/29/2021   Stenosis of intracranial vessel 09/29/2021   Essential hypertension 09/29/2021   Blurry vision 09/29/2021   GERD (gastroesophageal reflux disease) 09/29/2021   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 09/25/2021    ONSET DATE: 07/11/22- referral date  REFERRING DIAG:  Diagnosis  I61.9 (ICD-10-CM) - Hemorrhagic stroke ([name])    THERAPY DIAG:  Muscle weakness (generalized)  Unsteadiness on feet  Hemiplegia and 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 left non-dominant side ([name])  Other lack of coordination  Other symptoms and signs involving the nervous system  Other symptoms and signs involving cognitive functions following cerebral  infarction  Rationale for Evaluation and Treatment: Rehabilitation  SUBJECTIVE:   SUBJECTIVE STATEMENT:  Pt reports she ate yogurt before appt.  PERTINENT HISTORY: 67 year old female here for evaluation of right thalamic intracerebral hemorrhage. Patient is was admitted to the hospital in June 2023 for new onset left-sided weakness headache and nausea. She was found to have intracerebral hemorrhage in the right thalamus. Patient was on Eliquis anticoagulation for atrial fibrillation at the time and this was reversed medically. She was started on hypertonic saline, blood pressure control in ICU admission. Patient went to inpatient rehabilitation. Now patient back home. Still has left-sided weakness and left hand and arm and leg pain. Has been to cardiology who recommended to hold anticoagulation for now as patient is not in atrial fibrillation.    Past medical history of atrial fibrillation on Eliquis aortic insufficiency, hypertension, fibromyalgia Pt received OT at [address] And was d/c in February PRECAUTIONS: Fall  WEIGHT BEARING RESTRICTIONS: No  PAIN:  Are you having pain? Yes: NPRS scale: 6/10 Pain location: left hand  Pain description: sharp, burning Aggravating factors: use Relieving factors: meds, rest  FALLS: Has patient fallen in last 6 months? 1 near fall  LIVING ENVIRONMENT: Lives with: lives with their family and lives with their daughter  Has following equipment at home: Shower bench  PLOF: Independent  PATIENT GOALS: continue to increase functional use of LUE  OBJECTIVE:  HAND DOMINANCE: Right  ADLs: Overall ADLs: Pt is performing basic ADLs mod I  Transfers/ambulation related to ADLs: Eating: mod I Grooming: mod I UB Dressing: mod I LB Dressing: mod I Toileting: mod I uses walker and w/c Bathing: mod I has tub bench Tub Shower transfers: mod I Equipment: Transfer tub bench and bed side commode  IADLs: Shopping: dtr performs the grocery shopping   Light housekeeping: pt vacuums seated in w/c  Meal Prep: uses oven mod I Community mobility: supervision with walker, alos has w/c Medication management: mod I Financial management: mod I   MOBILITY STATUS:  supervision to mod I with walker    ACTIVITY TOLERANCE: Activity tolerance: Pt fatigues quickly with ambulation    UPPER EXTREMITY ROM:  RUE WFLS, LUE pt has full composite flexion. Pt has hyperextension at PIP joint and flexion at DIP for ring finger  Active ROM Right eval Left eval  Shoulder flexion  80 with elbow flexed  Shoulder abduction    Shoulder adduction    Shoulder extension    Shoulder internal rotation    Shoulder external rotation    Elbow flexion  WFLS  Elbow extension  -15  Wrist flexion  WFLs  Wrist extension    Wrist ulnar deviation    Wrist radial deviation    Wrist pronation  WFLS  Wrist supination  WFLS  (Blank rows = not tested)  HAND FUNCTION: Grip strength: Right: 65 lbs; Left: 18 lbs  COORDINATION: 9 Hole Peg test: Right: NT sec; Left: 3 pegs placed in 2 mins sec  SENSATION: LUE  near absent sensation  EDEMA: n/a  MUSCLE TONE: LUE: Mild and Hypertonic  COGNITION: Overall cognitive status: Within functional limits for tasks assessed    OBSERVATIONS: Pt became fatigued when walking to [place] requiring a w/c for rest. Pt was assisted into her friends car and she was doing ok when she left.   TODAY'S TREATMENT:                                                                                                                              DATE: 09/23/22- Pt ambulated back to treatment room with RW and supervision. BP initially 170/105 Pt was place in supine position and asked to rest for 6 mins, BP 161/87 Therapist called Aquatic PT to ensure pt was ok to attend therapy today. Supine closed chain chest press, shoulder flexion, triceps extension with min facilitation and v.c for neck and shoulder positioning. Seated unilateral Shoulder  flexion with vertical pole,  for support of RUE min-mod facilitation Pt was provided with info for purchase of oval 8 splints.  Pt requested assistance with toileting due to difficulty with removal and redressing in bathing suit. Min A for undressing, min assist to sit on toilet as there was no grab bar on right side. Sit to stand from toilet with min A, facilitation.   09/17/22-BP on arrival after walking from waiting room with close  supervision was 156/99. Pt was provided with water and a rest break. Pt reports continued stress at home with her dtr. Pt was encouraged to consider a counselor. After rest break BP 151/87 Supine closed chain shoulder flexion and chest press with min-mod facilitation. Seated pt held a vertical pole with LUE to perform shoulder flexion and circumduction, min facilitation/ v.c Oval 8 splints were tried on  ring(size 7) and middle fingers (size 8) due to hyperextension at PIP joints, with pt developing swan neck deformity. Therpist to provide information for purchase next visit    09/02/22-Pt arrived in w/c for OT treatment. She reports going to the ED over the weekend with CVA symptoms.  Pt's testing was negative for CVA, and she was told that stress could bring on or amplify old CVA symptoms. BP 158/94 initially. Therapist encouraged pt to consider meeting with a counselor to help manage her stress. Pt is having stressful interactions with her dtr. but she reports she is in a safe environment. Pt's dtr is moving out in June. Deep breathing exercise performed, pt was encouraged to perform at home. Seated at the table pt practiced opening various sized containers using LUE to assist, min v.c and mod difficulty. Placing and removing large wooden dowel pegs with LUE, min difficulty removing, mod-max with replacing due to sensory deficits. Pt's BP remained elevated at end of session 148/93 so her PT session at the pool was cancelled after discussion with Rosalita Chessman Dilday  PT.   08/26/22 supine closed chain shoulder flexion with PVC pipe frame, min v.c and facilitation Seated closed chain reaching for the floor with bilateral UE's min v.c Seated and standing, UE ranger for shoulder flexion and circumduction, min v.c / min-mod facilitation Seated weightbearing through Left elbow, min facilitation/ v.c Functional reaching into eye level cabinets to place and remove lightweight items, mod difficulty, min facilitation/ v.c supervision for safety.   HOME EXERCISE PROGRAM: Unilateral shoulder flexion with vertical pole   GOALS: Goals reviewed with patient? No  SHORT TERM GOALS: Target date: 09/18/22  I with HEP  Goal status: INITIAL  2.  Pt will demonstrate improved LUE functional use as evidenced by increasing increase box and blocks score by 3 blocks  Goal status: ongoing  3.  Pt will verbalize understanding of AE/ DME options to increase safety and independence with ADLs/IADLs.  Goal status:  ongoing  4.  Pt will demonstrate LUE shoulder flexion to  85* with min compensation for improved functional reach.  Goal status: met 90*  5.  Pt will utilize LUE as a gross assist for ADLs at least 50% of the time with pain no greater than 4/10.  Goal status: partially met uses >50%, however pain 6/10    LONG TERM GOALS: Target date: 11/17/22  I with updated HEP.  Goal status: INITIAL  2.  Pt will report ability to perform simple garden task with AE/DME prn and supervision, following assist for set up.  Goal status: INITIAL  3.  Pt will demonstrate 90* shoulder flexion with min compensation for increased LUE functional use. Goal status: INITIAL  4.  Pt will demonstrate improved LUE functional use as evidenced by increasing increase box and blocks score by 6 blocks from eval  Goal status: INITIAL   ASSESSMENT:  CLINICAL IMPRESSION: Patient is progressing towards goals.  She reports improving LUE functional use however she remains limited by  stress.Marland Kitchen PERFORMANCE DEFICITS: in functional skills including ADLs, IADLs, coordination, dexterity, sensation, tone, ROM, strength, pain, flexibility, Fine  motor control, Gross motor control, mobility, endurance, decreased knowledge of precautions, decreased knowledge of use of DME, and UE functional use, cognitive skills including safety awareness, and psychosocial skills including coping strategies, environmental adaptation, habits, interpersonal interactions, and routines and behaviors.   IMPAIRMENTS: are limiting patient from ADLs, IADLs, rest and sleep, work, play, leisure, and social participation.   CO-MORBIDITIES: may have co-morbidities  that affects occupational performance. Patient will benefit from skilled OT to address above impairments and improve overall function.  MODIFICATION OR ASSISTANCE TO COMPLETE EVALUATION: No modification of tasks or assist necessary to complete an evaluation.  OT OCCUPATIONAL PROFILE AND HISTORY: Detailed assessment: Review of records and additional review of physical, cognitive, psychosocial history related to current functional performance.  CLINICAL DECISION MAKING: LOW - limited treatment options, no task modification necessary  REHAB POTENTIAL: Good  EVALUATION COMPLEXITY: Low    PLAN:  OT FREQUENCY: 1x/week  OT DURATION: 12 weeks plus eval, may d/c after 8 weeks dependent on progress.  PLANNED INTERVENTIONS: self care/ADL training, therapeutic exercise, therapeutic activity, neuromuscular re-education, manual therapy, passive range of motion, balance training, functional mobility training, aquatic therapy, splinting, ultrasound, paraffin, moist heat, cryotherapy, contrast bath, patient/family education, cognitive remediation/compensation, energy conservation, coping strategies training, DME and/or AE instructions, and Re-evaluation  RECOMMENDED OTHER SERVICES: n/a  CONSULTED AND AGREED WITH PLAN OF CARE: Patient  PLAN FOR NEXT SESSION:    Check short term goals, NMR   Mitchelle Goerner, OT 09/23/2022, 12:14 PM

## 2022-09-24 ENCOUNTER — Encounter: Payer: Self-pay | Admitting: Physical Therapy

## 2022-09-24 NOTE — Therapy (Signed)
OUTPATIENT PHYSICAL THERAPY NEURO TREATMENT NOTE/AQUATIC THERAPY       Patient Name: Charlotte Hunter MRN: [id number] DOB:Feb 11, 1956, 67 y.o., female Today's Date: 09/24/2022   PCP: Audery Amel, MD REFERRING PROVIDER: Jones Bales, NP     PT End of Session - 09/24/22 2020     Visit Number 63    Number of Visits 64   renewal completed for 8 additional visits   Date for PT Re-Evaluation 10/11/22    Authorization Type Medicare    Authorization Time Period 01-03-22 - 04-03-22;  04-01-22 - 06-13-22; 06-03-22 - 08-13-22; 07-29-22 - 09-20-22;  09-11-22 - 10-11-22    Progress Note Due on Visit 60    PT Start Time 1320    PT Stop Time 1400    PT Time Calculation (min) 40 min    Equipment Utilized During Treatment Other (comment)   aquatic cuffs, bar bells   Activity Tolerance Patient tolerated treatment well    Behavior During Therapy [facility] for tasks assessed/performed                                    Past Medical History:  Diagnosis Date   A-fib [facility])    Aortic insufficiency    Hypertension    Stroke [facility])    Past Surgical History:  Procedure Laterality Date   ABDOMINAL HYSTERECTOMY     ABLATION  05/03/2021   heart   c section  01/1968   KNEE SURGERY Right    meniscus   SHOULDER SURGERY Right 08/2014   Patient Active Problem List   Diagnosis Date Noted   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   ICH (intracerebral hemorrhage) ([name]) 10/01/2021   Paroxysmal atrial fibrillation ([name]) 09/29/2021   Stenosis of intracranial vessel 09/29/2021   Essential hypertension 09/29/2021   Blurry vision 09/29/2021   GERD (gastroesophageal reflux disease) 09/29/2021   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 09/25/2021    ONSET DATE: 09-25-21  REFERRING DIAG: I61.9 (ICD-10-CM) - Hemorrhagic stroke ([name])   THERAPY DIAG:  Hemiplegia and 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 left non-dominant side  ([name])  Unsteadiness on feet  Other abnormalities of gait and mobility  Rationale for Evaluation and Treatment Rehabilitation  SUBJECTIVE:  Pt reports she continues to do well at home - says she walked for about 30" with her rollator in her yard over the weekend  Past medical history of atrial fibrillation on Eliquis aortic insufficiency, hypertension, fibromyalgia  PAIN:  Are you having pain? Yes Location: Lt side of face & chin, edge of Lt arm (lateral side), foot and ankle and Lt buttock  Intensity: 1-2/10  Nerve pain, occasional shock sensation, "rope-like sensation";   04-09-22: not so much in pain but more numbness in Lt side of face - states numbness is moving out    PRECAUTIONS: Fall  WEIGHT BEARING RESTRICTIONS No  FALLS: Has patient fallen in last 6 months? No  LIVING ENVIRONMENT: Lives with: daughter, [name] Lives in: House/apartment Stairs: No - Ramp has been built at front entrance Has following equipment at home: Chiropractor, Environmental consultant - 2 wheeled, and bed side commode  PLOF: Independent; pt did gardening prior to CVA - was not employed prior to onset of CVA  PATIENT GOALS "be restored back to where I was"   OBJECTIVE:    Aquatic therapy at [facility] - pool temp 92 degrees  Patient seen for aquatic therapy  today.  Treatment took place in water 3.5-4.5 feet deep depending on activity.  Pt entered and exited the pool via step negotiation with use of bil. Hand rails with CGA using a step by step sequence  Pt performed water walking 18' x 4 reps with no device with SBA only; pt performed sideways amb. 18' x 2 reps and backwards 18' x 2 reps with CGA   Pt performed step ups with LLE onto aquatic step 10 reps with RUE support on pool edge; step down for Lt quad eccentric strengthening 10 reps with CGA and with cues to flex Lt knee prior to descension off step  Marching in place 10 reps each leg with CGA  Sidestepping with squats 18' x 2 reps across pool  with UE support prn on PT's forearms  Squats 10 reps bil. LE's  Pt performed Lt hip flexion, extension, abduction without use of aquatic cuff -  15 reps each direction - in standing:   Performed Lt knee extension/flexion in seated position - no aquatic cuff used - 10 reps with pt actively flexing Lt knee to 90 degrees  Pt requires buoyancy of water for support for joint unloading and for reduced fall risk with gait training without device than can be safely performed on land. Water current produces perturbations for challenge for static & dynamic standing balance.  Buoyancy of water provides unweighting which assists with pain reduction.     TherEx:  Access Code: [email or web address] - issued on 01-17-22 URL: [email or web address] Date: 01/17/2022 Prepared by: Maebelle Munroe  Exercises - Seated Soleus Stretch  - 2-3 x daily - 7 x weekly - 1 sets - 2 reps - 15-20 secs  hold - Slant Board Gastrocnemius Stretch  - 3 x daily - 7 x weekly - 1 sets - 2 reps - 15-20 secs hold - Seated Gastroc Stretch with Strap  - 2-3 x daily - 7 x weekly - 1 sets - 2 reps - 15-20 hold   PATIENT EDUCATION:   Education details: continue to practice gait with RW at home despite ongoing anxiety and fear of falling Person educated: Patient Education method: Explanation, Demonstration Education comprehension: verbalized understanding, demonstration   HOME EXERCISE PROGRAM:  Previously issued Access Code: [email or web address] URL: [email or web address] Date: 01/08/2022 Prepared by: Maebelle Munroe  Exercises - Bridge  - 1 x daily - 7 x weekly - 1 sets - 10 reps - 5 hold - Single Leg Bridge  - 1 x daily - 7 x weekly - 1 sets - 10 reps - 3 sec hold - Hooklying Clamshell with Resistance  - 1 x daily - 7 x weekly - 1 sets - 10 reps - 2-3 sec hold - Supine Hip Flexion with Resistance Loop  - 1 x daily - 7 x weekly - 3 sets - 10 reps - Supine Heel Slide  - 1 x daily - 7 x weekly - 3 sets - 10 reps - Stepping  out/in with LEFT leg  - 1 x daily - 7 x weekly - 3 sets - 10 reps - Hooklying Hamstring Stretch with Strap  - 1 x daily - 7 x weekly - 1 sets - 1-2 reps - 20 sec hold  GOALS: Goals reviewed with patient? Yes    NEW SHORT TERM GOALS:  TARGET DATE 08-23-22   Pt will amb. 34' with [facility] with SBA for increased household ambulation with this device.  Baseline;  pt reports using RW for assistance with household amb; 115 ft with [facility]  and CGA (5/8)  Goal status:  IN PROGRESS  2.  Amb. 250' with RW with SBA on flat, even surface to demo improved endurance.  Baseline:  57' with RW (5/8)  Goal status:  IN PROGRESS  3. Pt will increase Berg balance test score to >/= 41/56 to reduce fall risk.  Baseline:  33/56 on 04-01-22; Berg score 34/56 on 05-08-54; 36/56 (4/10) Goal status: IN PROGRESS/ONGOING  4.  Pt will improve TUG score to </= 43 secs with RW to demo improved functional mobility and reduced fall risk.  Baseline: 2 min with SBQC:  59 sec with RW mod I (2/21), 49 sec with RW mod I (4/10) Goal status: IN PROGRESS/ONGOING  5.  Pt will be able to pick up object off floor from standing position without UE support with SBA to demo improved balance with bending/leaning forward and returning to upright position.    Baseline:  unable to pick up object off floor without UE support, still requires UE support (5/8)            Goal status:  IN PROGRESS    6.  Pt will report ability to ambulate up and down ramp with RW at her home modified independently.   Baseline:  currently able to walk up ramp but not down the ramp, still needs SBA for descent (5/8) Goal status:  IN PROGRESS  7.  Pt will be able to kneel on Kay bench with min assist and return to standing.            Baseline:  TBA            Goal status: Goal deferred due to difficulty     NEW LONG TERM GOALS:  TARGET DATE: 09-13-22; new TARGET DATE 10-11-22 for 2 aquatic PT sessions    Pt will report negotiating steps at her home with use  of handrail modified independently.  Baseline:  has negotiated steps with SBA from daughter, pt continues to be SBA with handrail (5/29) Goal status:  NOT MET  2.   Independent in aquatic HEP to be continued upon discharge from PT. Baseline:  in progress Goal status:  NEW     ASSESSMENT:  CLINICAL IMPRESSION: Today's aquatic PT session focused on LLE strengthening exercises, gait training with pt wearing Lt AFO and balance training with focus on LLE SLS.  Pt continues to need min assist to prevent Lt knee hyperextension in Lt SLS.   Pt demonstrates improved balance with water walking with no UE support used.  Continue aquatic PT for 1 additional session.    OBJECTIVE IMPAIRMENTS Abnormal gait, decreased activity tolerance, decreased balance, decreased coordination, decreased endurance, decreased knowledge of use of DME, decreased ROM, decreased strength, impaired sensation, impaired tone, and impaired UE functional use.   ACTIVITY LIMITATIONS carrying, lifting, bending, standing, squatting, stairs, transfers, bed mobility, and locomotion level  PARTICIPATION LIMITATIONS: meal prep, cleaning, laundry, driving, shopping, community activity, and yard work  PERSONAL FACTORS Fitness and severity of deficits  are also affecting patient's functional outcome.   REHAB POTENTIAL: Good  EVALUATION COMPLEXITY: Moderate  PLAN: PT FREQUENCY:  1x/week (for 3 aquatic PT visits)  PT DURATION:  3 weeks  PLANNED INTERVENTIONS: Therapeutic exercises, Therapeutic activity, Neuromuscular re-education, Balance training, Gait training, Patient/Family education, Self Care, Stair training, Orthotic/Fit training, DME instructions, Aquatic Therapy, and Wheelchair mobility training  PLAN FOR NEXT SESSION: Cont aquatic PT for 1 session   Alden Feagan, Donavan Burnet, PT  [facility] [phone]  [address] [place], [place] [address] 09/24/2022, 8:23 PM

## 2022-10-07 ENCOUNTER — Ambulatory Visit: Payer: Medicare Other | Admitting: Occupational Therapy

## 2022-10-07 ENCOUNTER — Encounter: Payer: Self-pay | Admitting: Occupational Therapy

## 2022-10-07 ENCOUNTER — Ambulatory Visit: Payer: Medicare Other | Admitting: Physical Therapy

## 2022-10-07 DIAGNOSIS — R2681 Unsteadiness on feet: Secondary | ICD-10-CM

## 2022-10-07 DIAGNOSIS — R278 Other lack of coordination: Secondary | ICD-10-CM

## 2022-10-07 DIAGNOSIS — M6281 Muscle weakness (generalized): Secondary | ICD-10-CM

## 2022-10-07 DIAGNOSIS — R29818 Other symptoms and signs involving the nervous system: Secondary | ICD-10-CM

## 2022-10-07 DIAGNOSIS — I69154 Hemiplegia and 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 left non-dominant side: Secondary | ICD-10-CM

## 2022-10-07 DIAGNOSIS — I69318 Other symptoms and signs involving cognitive functions following cerebral infarction: Secondary | ICD-10-CM

## 2022-10-07 DIAGNOSIS — R2689 Other abnormalities of gait and mobility: Secondary | ICD-10-CM

## 2022-10-07 NOTE — Therapy (Signed)
OUTPATIENT OCCUPATIONAL THERAPY NEURO Treatment   Patient Name: Charlotte Hunter MRN: [id number] DOB:14-Nov-1955, 67 y.o., female Today's Date: 10/07/2022  PCP: Dr. Syble Creek REFERRING PROVIDER: Dr. Wynn Banker   END OF SESSION:  OT End of Session - 10/07/22 1630     Visit Number 6    Number of Visits 13    Date for OT Re-Evaluation 11/17/22    Authorization - Visit Number 6    Authorization - Number of Visits 10    Progress Note Due on Visit 10    OT Start Time 1232    OT Stop Time 1315    OT Time Calculation (min) 43 min                 Past Medical History:  Diagnosis Date   A-fib [facility]    Aortic insufficiency    Hypertension    Stroke [facility])    Past Surgical History:  Procedure Laterality Date   ABDOMINAL HYSTERECTOMY     ABLATION  05/03/2021   heart   c section  01/1968   KNEE SURGERY Right    meniscus   SHOULDER SURGERY Right 08/2014   Patient Active Problem List   Diagnosis Date Noted   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   ICH (intracerebral hemorrhage) ([name]) 10/01/2021   Paroxysmal atrial fibrillation ([name]) 09/29/2021   Stenosis of intracranial vessel 09/29/2021   Essential hypertension 09/29/2021   Blurry vision 09/29/2021   GERD (gastroesophageal reflux disease) 09/29/2021   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 09/25/2021    ONSET DATE: 07/11/22- referral date  REFERRING DIAG:  Diagnosis  I61.9 (ICD-10-CM) - Hemorrhagic stroke ([name])    THERAPY DIAG:  Muscle weakness (generalized)  Unsteadiness on feet  Hemiplegia and 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 left non-dominant side ([name])  Other lack of coordination  Other symptoms and signs involving the nervous system  Other symptoms and signs involving cognitive functions following cerebral infarction  Other abnormalities of gait and mobility  Rationale for Evaluation and Treatment: Rehabilitation  SUBJECTIVE:   SUBJECTIVE  STATEMENT:  Pt reports terrible pain today and this weekend  PERTINENT HISTORY: 67 year old female here for evaluation of right thalamic intracerebral hemorrhage. Patient is was admitted to the hospital in June 2023 for new onset left-sided weakness headache and nausea. She was found to have intracerebral hemorrhage in the right thalamus. Patient was on Eliquis anticoagulation for atrial fibrillation at the time and this was reversed medically. She was started on hypertonic saline, blood pressure control in ICU admission. Patient went to inpatient rehabilitation. Now patient back home. Still has left-sided weakness and left hand and arm and leg pain. Has been to cardiology who recommended to hold anticoagulation for now as patient is not in atrial fibrillation.    Past medical history of atrial fibrillation on Eliquis aortic insufficiency, hypertension, fibromyalgia Pt received OT at [address] And was d/c in February PRECAUTIONS: Fall  WEIGHT BEARING RESTRICTIONS: No  PAIN:  Are you having pain? Yes: NPRS scale: 9/10 Pain location: left hand, arm, foot  Pain description: sharp, burning Aggravating factors: use Relieving factors: meds, rest  FALLS: Has patient fallen in last 6 months? 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  PLOF: Independent  PATIENT GOALS: continue to increase functional use of LUE  OBJECTIVE:   HAND DOMINANCE: Right  ADLs: Overall ADLs: Pt is performing basic ADLs mod I  Transfers/ambulation related to ADLs: Eating: mod I Grooming: mod I UB Dressing: mod I LB Dressing: mod I Toileting: mod I uses walker and w/c Bathing: mod I has tub bench Tub Shower transfers: mod I Equipment: Transfer tub bench and bed side commode  IADLs: Shopping: dtr performs the grocery shopping  Light housekeeping: pt vacuums seated in w/c  Meal Prep: uses oven mod  I Community mobility: supervision with walker, alos has w/c Medication management: mod I Financial management: mod I   MOBILITY STATUS:  supervision to mod I with walker    ACTIVITY TOLERANCE: Activity tolerance: Pt fatigues quickly with ambulation    UPPER EXTREMITY ROM:  RUE WFLS, LUE pt has full composite flexion. Pt has hyperextension at PIP joint and flexion at DIP for ring finger  Active ROM Right eval Left eval  Shoulder flexion  80 with elbow flexed  Shoulder abduction    Shoulder adduction    Shoulder extension    Shoulder internal rotation    Shoulder external rotation    Elbow flexion  WFLS  Elbow extension  -15  Wrist flexion  WFLs  Wrist extension    Wrist ulnar deviation    Wrist radial deviation    Wrist pronation  WFLS  Wrist supination  WFLS  (Blank rows = not tested)  HAND FUNCTION: Grip strength: Right: 65 lbs; Left: 18 lbs  COORDINATION: 9 Hole Peg test: Right: NT sec; Left: 3 pegs placed in 2 mins sec  SENSATION: LUE  near absent sensation  EDEMA: n/a  MUSCLE TONE: LUE: Mild and Hypertonic  COGNITION: Overall cognitive status: Within functional limits for tasks assessed    OBSERVATIONS: Pt became fatigued when walking to [place] requiring a w/c for rest. Pt was assisted into her friends car and she was doing ok when she left.   TODAY'S TREATMENT:                                                                                                                              DATE: 10/07/22- Pt reports severe pain today in R side however pt desires to participate. Pt transferred w/c to mat with min a stand pivot due to pain. UE ranger for mid range shoulder flexion, circumduction, horizontal abduction, min facilitation. Towel scrunches for finger flexion/ extension, min v.c Flipping large playing cards then sliding them off the top of deck with RUE, min v.c / min-mod difficulty for increased control. Resisted rowing and chest press with yellow  theraband attaches to cane, as well as resisted curls, min facilitation/ v.c    09/23/22- Pt ambulated back to treatment room with RW and supervision. BP initially 170/105 Pt was place in supine position and asked to rest for 6 mins, BP 161/87 Therapist called Aquatic PT to ensure pt was ok to attend therapy today. Supine closed chain chest press, shoulder flexion, triceps extension with min facilitation and v.c for neck and shoulder positioning. Seated unilateral Shoulder flexion with  vertical pole,  for support of RUE min-mod facilitation Pt was provided with info for purchase of oval 8 splints.  Pt requested assistance with toileting due to difficulty with removal and redressing in bathing suit. Min A for undressing, min assist to sit on toilet as there was no grab bar on right side. Sit to stand from toilet with min A, facilitation.   09/17/22-BP on arrival after walking from waiting room with close supervision was 156/99. Pt was provided with water and a rest break. Pt reports continued stress at home with her dtr. Pt was encouraged to consider a counselor. After rest break BP 151/87 Supine closed chain shoulder flexion and chest press with min-mod facilitation. Seated pt held a vertical pole with LUE to perform shoulder flexion and circumduction, min facilitation/ v.c Oval 8 splints were tried on  ring(size 7) and middle fingers (size 8) due to hyperextension at PIP joints, with pt developing swan neck deformity. Therpist to provide information for purchase next visit    09/02/22-Pt arrived in w/c for OT treatment. She reports going to the ED over the weekend with CVA symptoms.  Pt's testing was negative for CVA, and she was told that stress could bring on or amplify old CVA symptoms. BP 158/94 initially. Therapist encouraged pt to consider meeting with a counselor to help manage her stress. Pt is having stressful interactions with her dtr. but she reports she is in a safe environment. Pt's  dtr is moving out in June. Deep breathing exercise performed, pt was encouraged to perform at home. Seated at the table pt practiced opening various sized containers using LUE to assist, min v.c and mod difficulty. Placing and removing large wooden dowel pegs with LUE, min difficulty removing, mod-max with replacing due to sensory deficits. Pt's BP remained elevated at end of session 148/93 so her PT session at the pool was cancelled after discussion with Rosalita Chessman Dilday PT.   08/26/22 supine closed chain shoulder flexion with PVC pipe frame, min v.c and facilitation Seated closed chain reaching for the floor with bilateral UE's min v.c Seated and standing, UE ranger for shoulder flexion and circumduction, min v.c / min-mod facilitation Seated weightbearing through Left elbow, min facilitation/ v.c Functional reaching into eye level cabinets to place and remove lightweight items, mod difficulty, min facilitation/ v.c supervision for safety.   HOME EXERCISE PROGRAM: Unilateral shoulder flexion with vertical pole   GOALS: Goals reviewed with patient? No  SHORT TERM GOALS: Target date: 09/18/22  I with HEP  Goal status: met  2.  Pt will demonstrate improved LUE functional use as evidenced by increasing increase box and blocks score by 3 blocks  Goal status: ongoing, not tested today due to pain  3.  Pt will verbalize understanding of AE/ DME options to increase safety and independence with ADLs/IADLs.  Goal status:  ongoing  4.  Pt will demonstrate LUE shoulder flexion to  85* with min compensation for improved functional reach.  Goal status: met 90*  5.  Pt will utilize LUE as a gross assist for ADLs at least 50% of the time with pain no greater than 4/10.  Goal status: partially met uses >50%, however pain 6/10    LONG TERM GOALS: Target date: 11/17/22  I with updated HEP.  Goal status: INITIAL  2.  Pt will report ability to perform simple garden task with AE/DME prn and  supervision, following assist for set up.  Goal status: INITIAL  3.  Pt will demonstrate 90*  shoulder flexion with min compensation for increased LUE functional use. Goal status: INITIAL  4.  Pt will demonstrate improved LUE functional use as evidenced by increasing increase box and blocks score by 6 blocks from eval  Goal status: INITIAL   ASSESSMENT:  CLINICAL IMPRESSION: Patient is progressing towards goals.  She  was limited today by severe left sided pain however she was able to participate. Pt demonstrates overall improved LUE functional use. PERFORMANCE DEFICITS: in functional skills including ADLs, IADLs, coordination, dexterity, sensation, tone, ROM, strength, pain, flexibility, Fine motor control, Gross motor control, mobility, endurance, decreased knowledge of precautions, decreased knowledge of use of DME, and UE functional use, cognitive skills including safety awareness, and psychosocial skills including coping strategies, environmental adaptation, habits, interpersonal interactions, and routines and behaviors.   IMPAIRMENTS: are limiting patient from ADLs, IADLs, rest and sleep, work, play, leisure, and social participation.   CO-MORBIDITIES: may have co-morbidities  that affects occupational performance. Patient will benefit from skilled OT to address above impairments and improve overall function.  MODIFICATION OR ASSISTANCE TO COMPLETE EVALUATION: No modification of tasks or assist necessary to complete an evaluation.  OT OCCUPATIONAL PROFILE AND HISTORY: Detailed assessment: Review of records and additional review of physical, cognitive, psychosocial history related to current functional performance.  CLINICAL DECISION MAKING: LOW - limited treatment options, no task modification necessary  REHAB POTENTIAL: Good  EVALUATION COMPLEXITY: Low    PLAN:  OT FREQUENCY: 1x/week  OT DURATION: 12 weeks plus eval, may d/c after 8 weeks dependent on progress.  PLANNED  INTERVENTIONS: self care/ADL training, therapeutic exercise, therapeutic activity, neuromuscular re-education, manual therapy, passive range of motion, balance training, functional mobility training, aquatic therapy, splinting, ultrasound, paraffin, moist heat, cryotherapy, contrast bath, patient/family education, cognitive remediation/compensation, energy conservation, coping strategies training, DME and/or AE instructions, and Re-evaluation  RECOMMENDED OTHER SERVICES: n/a  CONSULTED AND AGREED WITH PLAN OF CARE: Patient  PLAN FOR NEXT SESSION:   Box and blocks, check STG's, NMR   Abdulaziz Toman, OT 10/07/2022, 4:31 PM

## 2022-10-08 ENCOUNTER — Encounter: Payer: Self-pay | Admitting: Physical Therapy

## 2022-10-08 NOTE — Therapy (Signed)
OUTPATIENT PHYSICAL THERAPY NEURO TREATMENT NOTE/AQUATIC THERAPY/DISCHARGE SUMMARY       Patient Name: Charlotte Hunter MRN: [id number] DOB:March 14, 1956, 67 y.o., female Today's Date: 10/08/2022   PCP: Audery Amel, MD REFERRING PROVIDER: Jones Bales, NP     PT End of Session - 10/08/22 1924     Visit Number 64    Number of Visits 64   renewal completed for 8 additional visits   Date for PT Re-Evaluation 10/11/22    Authorization Type Medicare    Authorization Time Period 01-03-22 - 04-03-22;  04-01-22 - 06-13-22; 06-03-22 - 08-13-22; 07-29-22 - 09-20-22;  09-11-22 - 10-11-22    Progress Note Due on Visit 60    PT Start Time 1405    PT Stop Time 1455    PT Time Calculation (min) 50 min    Equipment Utilized During Treatment Other (comment)   bar bells   Activity Tolerance Patient tolerated treatment well;Patient limited by pain    Behavior During Therapy [facility] for tasks assessed/performed                                    Past Medical History:  Diagnosis Date   A-fib [facility])    Aortic insufficiency    Hypertension    Stroke [facility])    Past Surgical History:  Procedure Laterality Date   ABDOMINAL HYSTERECTOMY     ABLATION  05/03/2021   heart   c section  01/1968   KNEE SURGERY Right    meniscus   SHOULDER SURGERY Right 08/2014   Patient Active Problem List   Diagnosis Date Noted   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   ICH (intracerebral hemorrhage) ([name]) 10/01/2021   Paroxysmal atrial fibrillation ([name]) 09/29/2021   Stenosis of intracranial vessel 09/29/2021   Essential hypertension 09/29/2021   Blurry vision 09/29/2021   GERD (gastroesophageal reflux disease) 09/29/2021   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 09/25/2021    ONSET DATE: 09-25-21  REFERRING DIAG: I61.9 (ICD-10-CM) - Hemorrhagic stroke ([name])   THERAPY DIAG:  Hemiplegia and 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 left  non-dominant side ([name])  Muscle weakness (generalized)  Unsteadiness on feet  Rationale for Evaluation and Treatment Rehabilitation  SUBJECTIVE:  Pt reports she is having a lot of the pain today on her left side - states it is on Lt side of mouth/chin, Lt buttock and Lt foot; states she was in bed a lot over the weekend because of the pain   Past medical history of atrial fibrillation on Eliquis aortic insufficiency, hypertension, fibromyalgia  PAIN:  Are you having pain? Yes Location: Lt side of face & chin, edge of Lt arm (lateral side), foot and ankle and Lt buttock  Intensity: 1-2/10  Nerve pain, occasional shock sensation, "rope-like sensation";   04-09-22: not so much in pain but more numbness in Lt side of face - states numbness is moving out    PRECAUTIONS: Fall  WEIGHT BEARING RESTRICTIONS No  FALLS: Has patient fallen in last 6 months? No  LIVING ENVIRONMENT: Lives with: daughter, [name] Lives in: House/apartment Stairs: No - Ramp has been built at front entrance Has following equipment at home: Chiropractor, Environmental consultant - 2 wheeled, and bed side commode  PLOF: Independent; pt did gardening prior to CVA - was not employed prior to onset of CVA  PATIENT GOALS "be restored back to where I was"   OBJECTIVE:  Aquatic therapy at [facility] - pool temp 92 degrees  Patient seen for aquatic therapy today.  Treatment took place in water 3.5-4.5 feet deep depending on activity.  Pt entered and exited the pool via step negotiation with use of bil. Hand rails with CGA using a step by step sequence  Pt performed water walking 18' x 10 reps with no device with SBA only; pt performed sideways amb. 18' x 6 reps and backwards 18' x 4 reps with CGA  Marching in place 10 reps each leg with CGA  Squats 10 reps bil. LE's   Standing Lt hip flexion, extension, and abduction 10 reps each direction - no increased resistance used  Performed Lt knee extension/flexion in seated  position - no aquatic cuff used - 15 reps with pt actively flexing Lt knee to 90 degrees  Pt requires buoyancy of water for support for joint unloading and for reduced fall risk with gait training without device than can be safely performed on land. Water current produces perturbations for challenge for static & dynamic standing balance.  Buoyancy of water provides unweighting which assists with pain reduction.     TherEx:  Access Code: [email or web address] - issued on 01-17-22 URL: [email or web address] Date: 01/17/2022 Prepared by: Maebelle Munroe  Exercises - Seated Soleus Stretch  - 2-3 x daily - 7 x weekly - 1 sets - 2 reps - 15-20 secs  hold - Slant Board Gastrocnemius Stretch  - 3 x daily - 7 x weekly - 1 sets - 2 reps - 15-20 secs hold - Seated Gastroc Stretch with Strap  - 2-3 x daily - 7 x weekly - 1 sets - 2 reps - 15-20 hold   PATIENT EDUCATION:   Education details: continue to practice gait with RW at home despite ongoing anxiety and fear of falling Person educated: Patient Education method: Explanation, Demonstration Education comprehension: verbalized understanding, demonstration   HOME EXERCISE PROGRAM:  Previously issued Access Code: [email or web address] URL: [email or web address] Date: 01/08/2022 Prepared by: Maebelle Munroe  Exercises - Bridge  - 1 x daily - 7 x weekly - 1 sets - 10 reps - 5 hold - Single Leg Bridge  - 1 x daily - 7 x weekly - 1 sets - 10 reps - 3 sec hold - Hooklying Clamshell with Resistance  - 1 x daily - 7 x weekly - 1 sets - 10 reps - 2-3 sec hold - Supine Hip Flexion with Resistance Loop  - 1 x daily - 7 x weekly - 3 sets - 10 reps - Supine Heel Slide  - 1 x daily - 7 x weekly - 3 sets - 10 reps - Stepping out/in with LEFT leg  - 1 x daily - 7 x weekly - 3 sets - 10 reps - Hooklying Hamstring Stretch with Strap  - 1 x daily - 7 x weekly - 1 sets - 1-2 reps - 20 sec hold  GOALS: Goals reviewed with patient? Yes    NEW SHORT TERM GOALS:   TARGET DATE 08-23-22   Pt will amb. 48' with [facility] with SBA for increased household ambulation with this device.  Baseline;  pt reports using RW for assistance with household amb; 115 ft with [facility] and CGA (5/8)  Goal status:  IN PROGRESS  2.  Amb. 250' with RW with SBA on flat, even surface to demo improved endurance.  Baseline:  3' with RW (5/8)  Goal status:  IN PROGRESS  3. Pt will increase Berg balance test score to >/=  41/56 to reduce fall risk.  Baseline:  33/56 on 04-01-22; Berg score 34/56 on 05-08-54; 36/56 (4/10) Goal status: IN PROGRESS/ONGOING  4.  Pt will improve TUG score to </= 43 secs with RW to demo improved functional mobility and reduced fall risk.  Baseline: 2 min with SBQC:  59 sec with RW mod I (2/21), 49 sec with RW mod I (4/10) Goal status: IN PROGRESS/ONGOING  5.  Pt will be able to pick up object off floor from standing position without UE support with SBA to demo improved balance with bending/leaning forward and returning to upright position.    Baseline:  unable to pick up object off floor without UE support, still requires UE support (5/8)            Goal status:  IN PROGRESS    6.  Pt will report ability to ambulate up and down ramp with RW at her home modified independently.   Baseline:  currently able to walk up ramp but not down the ramp, still needs SBA for descent (5/8) Goal status:  IN PROGRESS  7.  Pt will be able to kneel on Kay bench with min assist and return to standing.            Baseline:  TBA            Goal status: Goal deferred due to difficulty     NEW LONG TERM GOALS:  TARGET DATE: 09-13-22; new TARGET DATE 10-11-22 for 2 aquatic PT sessions    Pt will report negotiating steps at her home with use of handrail modified independently.  Baseline:  has negotiated steps with SBA from daughter, pt continues to be SBA with handrail (5/29) Goal status:  Partially met 10-07-22; pt has supervision for safety  2.   Independent in aquatic HEP  to be continued upon discharge from PT. Baseline:  in progress Goal status:  Goal met 10-07-22:  pt states she doesn't plan to continue aquatic exercise upon D/C from PT due to not wanting to go to the [facility] near where she lives    ASSESSMENT:  CLINICAL IMPRESSION: Pt has met initial LTG's #1 and 2; LTG's #3-7 not fully met per stated goal but improved from initial status/scores.  Pt has met updated LTG #2 and partially met updated LTG #1;  pt continues to use wheelchair for community mobility; she is able to amb. Household distances with RW and shorter distances in her home with use of quad cane modified independently.   Pt is discharged from PT due to plateau in maximizing functional progress at this time.      OBJECTIVE IMPAIRMENTS Abnormal gait, decreased activity tolerance, decreased balance, decreased coordination, decreased endurance, decreased knowledge of use of DME, decreased ROM, decreased strength, impaired sensation, impaired tone, and impaired UE functional use.   ACTIVITY LIMITATIONS carrying, lifting, bending, standing, squatting, stairs, transfers, bed mobility, and locomotion level  PARTICIPATION LIMITATIONS: meal prep, cleaning, laundry, driving, shopping, community activity, and yard work  PERSONAL FACTORS Fitness and severity of deficits  are also affecting patient's functional outcome.   REHAB POTENTIAL: Good  EVALUATION COMPLEXITY: Moderate  PLAN: PT FREQUENCY:  1x/week (for 3 aquatic PT visits)  PT DURATION:  3 weeks  PLANNED INTERVENTIONS: Therapeutic exercises, Therapeutic activity, Neuromuscular re-education, Balance training, Gait training, Patient/Family education, Self Care, Stair training, Orthotic/Fit training, DME instructions, Aquatic Therapy, and Wheelchair mobility training  PLAN FOR NEXT SESSION: D/C on 10-07-22    PHYSICAL THERAPY DISCHARGE SUMMARY  Visits from Start of Care: 64  Current functional level related to goals / functional  outcomes: See above for progress towards goals   Remaining deficits: Continued Lt hemiparesis with decreased functional use of LUE and LLE Continued dependency with gait with pt using RW or SBQC for assistance with household ambulation; pt using wheelchair for community mobility Continued decreased standing balance with pt needing UE support for safety   Education / Equipment: Pt has been instructed in HEP for LLE strengthening and stretching - reports compliance with HEP.    Patient agrees to discharge. Patient goals were partially met. Patient is being discharged due to meeting the stated rehab goals. Pt has maximized rehab potential at this time.    Kasidy Gianino, Donavan Burnet, PT  [facility] [address] [place], [place] [address] 10/08/2022, 7:27 PM

## 2022-10-23 ENCOUNTER — Ambulatory Visit: Payer: Medicare Other | Attending: Physical Medicine & Rehabilitation | Admitting: Occupational Therapy

## 2022-10-23 DIAGNOSIS — M6281 Muscle weakness (generalized): Secondary | ICD-10-CM | POA: Diagnosis present

## 2022-10-23 DIAGNOSIS — I69318 Other symptoms and signs involving cognitive functions following cerebral infarction: Secondary | ICD-10-CM | POA: Insufficient documentation

## 2022-10-23 DIAGNOSIS — I69154 Hemiplegia and 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 left non-dominant side: Secondary | ICD-10-CM | POA: Diagnosis present

## 2022-10-23 DIAGNOSIS — R2681 Unsteadiness on feet: Secondary | ICD-10-CM | POA: Diagnosis present

## 2022-10-23 DIAGNOSIS — R208 Other disturbances of skin sensation: Secondary | ICD-10-CM | POA: Insufficient documentation

## 2022-10-23 DIAGNOSIS — R278 Other lack of coordination: Secondary | ICD-10-CM | POA: Insufficient documentation

## 2022-10-23 DIAGNOSIS — R29818 Other symptoms and signs involving the nervous system: Secondary | ICD-10-CM | POA: Diagnosis present

## 2022-10-23 DIAGNOSIS — R2689 Other abnormalities of gait and mobility: Secondary | ICD-10-CM | POA: Diagnosis present

## 2022-10-23 NOTE — Therapy (Signed)
OUTPATIENT OCCUPATIONAL THERAPY NEURO Treatment   Patient Name: Charlotte Hunter MRN: [id number] DOB:24-May-1955, 67 y.o., female Today's Date: 10/23/2022  PCP: Dr. Syble Creek REFERRING PROVIDER: Dr. Wynn Banker   END OF SESSION:  OT End of Session - 10/23/22 1414     Visit Number 7    Number of Visits 13    Date for OT Re-Evaluation 11/17/22    Authorization - Visit Number 7    Authorization - Number of Visits 10    Progress Note Due on Visit 10    OT Start Time 1233    OT Stop Time 1327    OT Time Calculation (min) 54 min    Behavior During Therapy [facility] for tasks assessed/performed                  Past Medical History:  Diagnosis Date   A-fib [facility])    Aortic insufficiency    Hypertension    Stroke [facility])    Past Surgical History:  Procedure Laterality Date   ABDOMINAL HYSTERECTOMY     ABLATION  05/03/2021   heart   c section  01/1968   KNEE SURGERY Right    meniscus   SHOULDER SURGERY Right 08/2014   Patient Active Problem List   Diagnosis Date Noted   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   ICH (intracerebral hemorrhage) ([name]) 10/01/2021   Paroxysmal atrial fibrillation ([name]) 09/29/2021   Stenosis of intracranial vessel 09/29/2021   Essential hypertension 09/29/2021   Blurry vision 09/29/2021   GERD (gastroesophageal reflux disease) 09/29/2021   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 09/25/2021    ONSET DATE: 07/11/22- referral date  REFERRING DIAG:  Diagnosis  I61.9 (ICD-10-CM) - Hemorrhagic stroke ([name])    THERAPY DIAG:  Muscle weakness (generalized)  Hemiplegia and 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 left non-dominant side ([name])  Other lack of coordination  Other symptoms and signs involving the nervous system  Other symptoms and signs involving cognitive functions following cerebral infarction  Other abnormalities of gait and mobility  Other disturbances of skin sensation  Rationale for  Evaluation and Treatment: Rehabilitation  SUBJECTIVE:   SUBJECTIVE STATEMENT: Pt reports her dtr moved out last week  PERTINENT HISTORY: 67 year old female here for evaluation of right thalamic intracerebral hemorrhage. Patient is was admitted to the hospital in June 2023 for new onset left-sided weakness headache and nausea. She was found to have intracerebral hemorrhage in the right thalamus. Patient was on Eliquis anticoagulation for atrial fibrillation at the time and this was reversed medically. She was started on hypertonic saline, blood pressure control in ICU admission. Patient went to inpatient rehabilitation. Now patient back home. Still has left-sided weakness and left hand and arm and leg pain. Has been to cardiology who recommended to hold anticoagulation for now as patient is not in atrial fibrillation.    Past medical history of atrial fibrillation on Eliquis aortic insufficiency, hypertension, fibromyalgia Pt received OT at [address] And was d/c in February PRECAUTIONS: Fall  WEIGHT BEARING RESTRICTIONS: No  PAIN:  Are you having pain? Yes: NPRS scale: 6/10 Pain location: left hand, arm, foot  Pain description: sharp, burning Aggravating factors: use Relieving factors: meds, rest  FALLS: Has patient fallen in last 6 months? 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  PLOF: Independent  PATIENT GOALS: continue to increase functional use of LUE  OBJECTIVE:   HAND DOMINANCE: Right  ADLs: Overall ADLs: Pt is performing basic ADLs mod I  Transfers/ambulation related to ADLs: Eating: mod I Grooming: mod I UB Dressing: mod I LB Dressing: mod I Toileting: mod I uses walker and w/c Bathing: mod I has tub bench Tub Shower transfers: mod I Equipment: Transfer tub bench and bed side commode  IADLs: Shopping: dtr performs the grocery shopping  Light housekeeping:  pt vacuums seated in w/c  Meal Prep: uses oven mod I Community mobility: supervision with walker, alos has w/c Medication management: mod I Financial management: mod I   MOBILITY STATUS:  supervision to mod I with walker    ACTIVITY TOLERANCE: Activity tolerance: Pt fatigues quickly with ambulation    UPPER EXTREMITY ROM:  RUE WFLS, LUE pt has full composite flexion. Pt has hyperextension at PIP joint and flexion at DIP for ring finger  Active ROM Right eval Left eval  Shoulder flexion  80 with elbow flexed  Shoulder abduction    Shoulder adduction    Shoulder extension    Shoulder internal rotation    Shoulder external rotation    Elbow flexion  WFLS  Elbow extension  -15  Wrist flexion  WFLs  Wrist extension    Wrist ulnar deviation    Wrist radial deviation    Wrist pronation  WFLS  Wrist supination  WFLS  (Blank rows = not tested)  HAND FUNCTION: Grip strength: Right: 65 lbs; Left: 18 lbs  COORDINATION: 9 Hole Peg test: Right: NT sec; Left: 3 pegs placed in 2 mins sec  SENSATION: LUE  near absent sensation  EDEMA: n/a  MUSCLE TONE: LUE: Mild and Hypertonic  COGNITION: Overall cognitive status: Within functional limits for tasks assessed    OBSERVATIONS: Pt became fatigued when walking to [place] requiring a w/c for rest. Pt was assisted into her friends car and she was doing ok when she left.   TODAY'S TREATMENT:                                                                                                                              DATE: 10/23/22 Pt reports her daughter moved out and that has improved her stress level. Pt/ therapist discussed activities she is performing at home and remaining challenges.  Seated on mat closed chin reach for the floor with cane, min v.c then closed chain chest press, shoulder flexion and rotation while holding ball between palms, min v.c Standing at sink rocking forwards and backwards for weightbearing through bilateral  UE's, min facilitation/ v.c Seated on mat with LUE on small ball, rolling ball forwards, back wards and in circles for increased control and weightbearing, min v.c Seated at table box and blocks and attempts at 9 hole peg test see goals.    10/07/22- Pt reports severe pain today in R side however pt desires to participate. Pt transferred w/c to mat with min a stand pivot due to pain. UE ranger for mid range shoulder flexion,  circumduction, horizontal abduction, min facilitation. Towel scrunches for finger flexion/ extension, min v.c Flipping large playing cards then sliding them off the top of deck with RUE, min v.c / min-mod difficulty for increased control. Resisted rowing and chest press with yellow theraband attaches to cane, as well as resisted curls, min facilitation/ v.c    09/23/22- Pt ambulated back to treatment room with RW and supervision. BP initially 170/105 Pt was place in supine position and asked to rest for 6 mins, BP 161/87 Therapist called [facility] to ensure pt was ok to attend therapy today. Supine closed chain chest press, shoulder flexion, triceps extension with min facilitation and v.c for neck and shoulder positioning. Seated unilateral Shoulder flexion with vertical pole,  for support of RUE min-mod facilitation Pt was provided with info for purchase of oval 8 splints.  Pt requested assistance with toileting due to difficulty with removal and redressing in bathing suit. Min A for undressing, min assist to sit on toilet as there was no grab bar on right side. Sit to stand from toilet with min A, facilitation.   09/17/22-BP on arrival after walking from waiting room with close supervision was 156/99. Pt was provided with water and a rest break. Pt reports continued stress at home with her dtr. Pt was encouraged to consider a counselor. After rest break BP 151/87 Supine closed chain shoulder flexion and chest press with min-mod facilitation. Seated pt held a vertical  pole with LUE to perform shoulder flexion and circumduction, min facilitation/ v.c Oval 8 splints were tried on  ring(size 7) and middle fingers (size 8) due to hyperextension at PIP joints, with pt developing swan neck deformity. Therpist to provide information for purchase next visit    09/02/22-Pt arrived in w/c for OT treatment. She reports going to the ED over the weekend with CVA symptoms.  Pt's testing was negative for CVA, and she was told that stress could bring on or amplify old CVA symptoms. BP 158/94 initially. Therapist encouraged pt to consider meeting with a counselor to help manage her stress. Pt is having stressful interactions with her dtr. but she reports she is in a safe environment. Pt's dtr is moving out in June. Deep breathing exercise performed, pt was encouraged to perform at home. Seated at the table pt practiced opening various sized containers using LUE to assist, min v.c and mod difficulty. Placing and removing large wooden dowel pegs with LUE, min difficulty removing, mod-max with replacing due to sensory deficits. Pt's BP remained elevated at end of session 148/93 so her PT session at the pool was cancelled after discussion with Rosalita Chessman Dild[facility] PT.   08/26/22 supine closed chain shoulder flexion with PVC pipe frame, min v.c and facilitation Seated closed chain reaching for the floor with bilateral UE's min v.c Seated and standing, UE ranger for shoulder flexion and circumduction, min v.c / min-mod facilitation Seated weightbearing through Left elbow, min facilitation/ v.c Functional reaching into eye level cabinets to place and remove lightweight items, mod difficulty, min facilitation/ v.c supervision for safety.  PATIENT EDUCATION: Education details:  updated HEP- see pt instructions Person educated: Patient Education method: Explanation, Demonstration, Tactile cues, Verbal cues, and Handouts Education comprehension: verbalized understanding, returned demonstration,  and verbal cues required  HOME EXERCISE PROGRAM: Unilateral shoulder flexion with vertical pole   GOALS: Goals reviewed with patient? No  SHORT TERM GOALS: Target date: 09/18/22  I with HEP  Goal status: met  2.  Pt will demonstrate improved LUE functional  use as evidenced by increasing increase box and blocks score by 3 blocks  Goal status: approximating 13 blocks  3.  Pt will verbalize understanding of AE/ DME options to increase safety and independence with ADLs/IADLs.  Goal status:  ongoing  4.  Pt will demonstrate LUE shoulder flexion to  85* with min compensation for improved functional reach.  Goal status: met 90*  5.  Pt will utilize LUE as a gross assist for ADLs at least 50% of the time with pain no greater than 4/10.  Goal status: partially met uses >50%, however pain 6/10    LONG TERM GOALS: Target date: 11/17/22  I with updated HEP.  Goal status ongoing  2.  Pt will report ability to perform simple garden task with AE/DME prn and supervision, following assist for set up.  Goal status:  ongoing  3.  Pt will demonstrate 90* shoulder flexion with min compensation for increased LUE functional use. Goal status:  ongoing  4.  Pt will demonstrate improved LUE functional use as evidenced by increasing increase box and blocks score by 6 blocks from eval: Revised goal Pt will improve box blocks score  to 16 blocks total. (Score 13 blocks on 10/23/22)  Goal status: ongoing   ASSESSMENT:  CLINICAL IMPRESSION: Patient is progressing towards goals.  She  demonstrates improved overall improved LUE functional use and control. PERFORMANCE DEFICITS: in functional skills including ADLs, IADLs, coordination, dexterity, sensation, tone, ROM, strength, pain, flexibility, Fine motor control, Gross motor control, mobility, endurance, decreased knowledge of precautions, decreased knowledge of use of DME, and UE functional use, cognitive skills including safety awareness, and  psychosocial skills including coping strategies, environmental adaptation, habits, interpersonal interactions, and routines and behaviors.   IMPAIRMENTS: are limiting patient from ADLs, IADLs, rest and sleep, work, play, leisure, and social participation.   CO-MORBIDITIES: may have co-morbidities  that affects occupational performance. Patient will benefit from skilled OT to address above impairments and improve overall function.  MODIFICATION OR ASSISTANCE TO COMPLETE EVALUATION: No modification of tasks or assist necessary to complete an evaluation.  OT OCCUPATIONAL PROFILE AND HISTORY: Detailed assessment: Review of records and additional review of physical, cognitive, psychosocial history related to current functional performance.  CLINICAL DECISION MAKING: LOW - limited treatment options, no task modification necessary  REHAB POTENTIAL: Good  EVALUATION COMPLEXITY: Low    PLAN:  OT FREQUENCY: 1x/week  OT DURATION: 12 weeks plus eval, may d/c after 8 weeks dependent on progress.  PLANNED INTERVENTIONS: self care/ADL training, therapeutic exercise, therapeutic activity, neuromuscular re-education, manual therapy, passive range of motion, balance training, functional mobility training, aquatic therapy, splinting, ultrasound, paraffin, moist heat, cryotherapy, contrast bath, patient/family education, cognitive remediation/compensation, energy conservation, coping strategies training, DME and/or AE instructions, and Re-evaluation  RECOMMENDED OTHER SERVICES: n/a  CONSULTED AND AGREED WITH PLAN OF CARE: Patient  PLAN FOR NEXT SESSION:   NMR, functional use of LUE   Takita Riecke, OT 10/23/2022, 2:15 PM

## 2022-10-23 NOTE — Patient Instructions (Signed)
Weight Bearing (Standing)   Rest palms comfortably on table, gently lean  forward  and backwards over hands. Hold _10___ seconds. Repeat __10__ times. Do __1-2__ sessions per day.   Hold onto to your mop handle push pole forwards, backwards, side to side and in circles 10 reps 1x day   SHOULDER: Flexion Bilateral    Raise arms to chest height at same speed. Keep elbows straight. _10__ reps per set, _1-2_ sets per day, ___7 days per week Maintain object in midline.  Seated place hand on ball roll ball forwards backwards and in circles work on control. 10 reps 1 x day.  Copyright  VHI. All rights reserved.

## 2022-10-30 ENCOUNTER — Ambulatory Visit: Payer: Medicare Other | Admitting: Occupational Therapy

## 2022-10-30 DIAGNOSIS — M6281 Muscle weakness (generalized): Secondary | ICD-10-CM

## 2022-10-30 DIAGNOSIS — R2689 Other abnormalities of gait and mobility: Secondary | ICD-10-CM

## 2022-10-30 DIAGNOSIS — I69154 Hemiplegia and 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 left non-dominant side: Secondary | ICD-10-CM

## 2022-10-30 DIAGNOSIS — R29818 Other symptoms and signs involving the nervous system: Secondary | ICD-10-CM

## 2022-10-30 DIAGNOSIS — R278 Other lack of coordination: Secondary | ICD-10-CM

## 2022-10-30 DIAGNOSIS — I69318 Other symptoms and signs involving cognitive functions following cerebral infarction: Secondary | ICD-10-CM

## 2022-10-30 DIAGNOSIS — R208 Other disturbances of skin sensation: Secondary | ICD-10-CM

## 2022-10-30 NOTE — Therapy (Signed)
OUTPATIENT OCCUPATIONAL THERAPY NEURO Treatment   Patient Name: Charlotte Hunter MRN: [id number] DOB:[birth year], 67 y.o., female Today's Date: 10/30/2022  PCP: Dr. Syble Creek REFERRING PROVIDER: Dr. Wynn Banker   END OF SESSION:  OT End of Session - 10/30/22 1526     Visit Number 8    Number of Visits 13    Date for OT Re-Evaluation 11/17/22    Authorization Type M[employer]    Authorization - Visit Number 8    Authorization - Number of Visits 10    OT Start Time 1312    OT Stop Time 1404    OT Time Calculation (min) 52 min                  Past Medical History:  Diagnosis Date   A-fib ([name])    Aortic insufficiency    Hypertension    Stroke [facility])    Past Surgical History:  Procedure Laterality Date   ABDOMINAL HYSTERECTOMY     ABLATION  05/03/2021   heart   c section  01/1968   KNEE SURGERY Right    meniscus   SHOULDER SURGERY Right 08/2014   Patient Active Problem List   Diagnosis Date Noted   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   ICH (intracerebral hemorrhage) ([name]) 10/01/2021   Paroxysmal atrial fibrillation ([name]) 09/29/2021   Stenosis of intracranial vessel 09/29/2021   Essential hypertension 09/29/2021   Blurry vision 09/29/2021   GERD (gastroesophageal reflux disease) 09/29/2021   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 09/25/2021    ONSET DATE: 07/11/22- referral date  REFERRING DIAG:  Diagnosis  I61.9 (ICD-10-CM) - Hemorrhagic stroke ([name])    THERAPY DIAG:  Muscle weakness (generalized)  Hemiplegia and 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 left non-dominant side ([name])  Other lack of coordination  Other symptoms and signs involving the nervous system  Other symptoms and signs involving cognitive functions following cerebral infarction  Other abnormalities of gait and mobility  Other disturbances of skin sensation  Rationale for Evaluation and Treatment: Rehabilitation  SUBJECTIVE:    SUBJECTIVE STATEMENT: Pt reports her dtr moved out last week  PERTINENT HISTORY: 67 year old female here for evaluation of right thalamic intracerebral hemorrhage. Patient is was admitted to the hospital in June 2023 for new onset left-sided weakness headache and nausea. She was found to have intracerebral hemorrhage in the right thalamus. Patient was on Eliquis anticoagulation for atrial fibrillation at the time and this was reversed medically. She was started on hypertonic saline, blood pressure control in ICU admission. Patient went to inpatient rehabilitation. Now patient back home. Still has left-sided weakness and left hand and arm and leg pain. Has been to cardiology who recommended to hold anticoagulation for now as patient is not in atrial fibrillation.    Past medical history of atrial fibrillation on Eliquis aortic insufficiency, hypertension, fibromyalgia Pt received OT at [address] And was d/c in February PRECAUTIONS: Fall  WEIGHT BEARING RESTRICTIONS: No  PAIN:  Are you having pain? Yes: NPRS scale: 6/10 Pain location: left hand, arm, foot  Pain description: sharp, burning Aggravating factors: use Relieving factors: meds, rest  FALLS: Has patient fallen in last 6 months? 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  PLOF: Independent  PATIENT GOALS: continue to increase functional use of LUE  OBJECTIVE:   HAND DOMINANCE: Right  ADLs: Overall ADLs: Pt is performing basic ADLs mod I  Transfers/ambulation related to ADLs: Eating: mod I Grooming: mod I UB Dressing: mod I LB Dressing: mod I Toileting: mod I uses walker and w/c Bathing: mod I has tub bench Tub Shower transfers: mod I Equipment: Transfer tub bench and bed side commode  IADLs: Shopping: dtr performs the grocery shopping  Light housekeeping: pt vacuums seated in w/c  Meal Prep: uses oven mod  I Community mobility: supervision with walker, alos has w/c Medication management: mod I Financial management: mod I   MOBILITY STATUS:  supervision to mod I with walker    ACTIVITY TOLERANCE: Activity tolerance: Pt fatigues quickly with ambulation    UPPER EXTREMITY ROM:  RUE WFLS, LUE pt has full composite flexion. Pt has hyperextension at PIP joint and flexion at DIP for ring finger  Active ROM Right eval Left eval  Shoulder flexion  80 with elbow flexed  Shoulder abduction    Shoulder adduction    Shoulder extension    Shoulder internal rotation    Shoulder external rotation    Elbow flexion  WFLS  Elbow extension  -15  Wrist flexion  WFLs  Wrist extension    Wrist ulnar deviation    Wrist radial deviation    Wrist pronation  WFLS  Wrist supination  WFLS  (Blank rows = not tested)  HAND FUNCTION: Grip strength: Right: 65 lbs; Left: 18 lbs  COORDINATION: 9 Hole Peg test: Right: NT sec; Left: 3 pegs placed in 2 mins sec  SENSATION: LUE  near absent sensation  EDEMA: n/a  MUSCLE TONE: LUE: Mild and Hypertonic  COGNITION: Overall cognitive status: Within functional limits for tasks assessed    OBSERVATIONS: Pt became fatigued when walking to [facility] requiring a w/c for rest. Pt was assisted into her friends car and she was doing ok when she left.   TODAY'S TREATMENT:                                                                                                                              DATE: 10/30/22- Pt transferred to mat mod I Seated on mat closed chain shoulder flexion and chest press with cane, then pt transitioned fto holding medium ball between hands and she performed controlled chest press and shoulder flexion 2 sets of 5 reps, min v.c Seated weightbearing through L hand over ball while performing  small circles with ball on mat, min facilitation/ v.c Unilateral shoulder flexion pushing washcloth up incline x 10 reps for control, min v.c Open chain  reaching with LUE to grasp cylindrical objects and place from mat to tabletop, min difficulty/ v.c Resisted chest press with yellow band followed by bilateral rowing with yellow band, 10 reps each. Therapist issued rowing for home, pt verbalized understanding of performance. 10/23/22 Pt reports her daughter moved out and that has improved her stress level. Pt/ therapist discussed activities she is performing at home and remaining challenges.  Seated on mat closed chin reach for the floor  with cane, min v.c then closed chain chest press, shoulder flexion and rotation while holding ball between palms, min v.c Standing at sink rocking forwards and backwards for weightbearing through bilateral UE's, min facilitation/ v.c Seated on mat with LUE on small ball, rolling ball forwards, back wards and in circles for increased control and weightbearing, min v.c Seated at table box and blocks and attempts at 9 hole peg test see goals.    10/07/22- Pt reports severe pain today in R side however pt desires to participate. Pt transferred w/c to mat with min a stand pivot due to pain. UE ranger for mid range shoulder flexion, circumduction, horizontal abduction, min facilitation. Towel scrunches for finger flexion/ extension, min v.c Flipping large playing cards then sliding them off the top of deck with RUE, min v.c / min-mod difficulty for increased control. Resisted rowing and chest press with yellow theraband attaches to cane, as well as resisted curls, min facilitation/ v.c    09/23/22- Pt ambulated back to treatment room with RW and supervision. BP initially 170/105 Pt was place in supine position and asked to rest for 6 mins, BP 161/87 Therapist called [facility] to ensure pt was ok to attend therapy today. Supine closed chain chest press, shoulder flexion, triceps extension with min facilitation and v.c for neck and shoulder positioning. Seated unilateral Shoulder flexion with vertical pole,  for support  of RUE min-mod facilitation Pt was provided with info for purchase of oval 8 splints.  Pt requested assistance with toileting due to difficulty with removal and redressing in bathing suit. Min A for undressing, min assist to sit on toilet as there was no grab bar on right side. Sit to stand from toilet with min A, facilitation.   09/17/22-BP on arrival after walking from waiting room with close supervision was 156/99. Pt was provided with water and a rest break. Pt reports continued stress at home with her dtr. Pt was encouraged to consider a counselor. After rest break BP 151/87 Supine closed chain shoulder flexion and chest press with min-mod facilitation. Seated pt held a vertical pole with LUE to perform shoulder flexion and circumduction, min facilitation/ v.c Oval 8 splints were tried on  ring(size 7) and middle fingers (size 8) due to hyperextension at PIP joints, with pt developing swan neck deformity. Therpist to provide information for purchase next visit    09/02/22-Pt arrived in w/c for OT treatment. She reports going to the ED over the weekend with CVA symptoms.  Pt's testing was negative for CVA, and she was told that stress could bring on or amplify old CVA symptoms. BP 158/94 initially. Therapist encouraged pt to consider meeting with a counselor to help manage her stress. Pt is having stressful interactions with her dtr. but she reports she is in a safe environment. Pt's dtr is moving out in June. Deep breathing exercise performed, pt was encouraged to perform at home. Seated at the table pt practiced opening various sized containers using LUE to assist, min v.c and mod difficulty. Placing and removing large wooden dowel pegs with LUE, min difficulty removing, mod-max with replacing due to sensory deficits. Pt's BP remained elevated at end of session 148/93 so her PT session at the pool was cancelled after discussion with Rosalita Chessman Dild[facility] PT.   08/26/22 supine closed chain shoulder  flexion with PVC pipe frame, min v.c and facilitation Seated closed chain reaching for the floor with bilateral UE's min v.c Seated and standing, UE ranger for shoulder flexion and  circumduction, min v.c / min-mod facilitation Seated weightbearing through Left elbow, min facilitation/ v.c Functional reaching into eye level cabinets to place and remove lightweight items, mod difficulty, min facilitation/ v.c supervision for safety.  PATIENT EDUCATION: Education details:   rowing with yellow band Person educated: Patient Education method: Explanation, Demonstration, Tactile cues, Verbal cues, Education comprehension: verbalized understanding, returned demonstration, and verbal cues required  HOME EXERCISE PROGRAM: Unilateral shoulder flexion with vertical pole   GOALS: Goals reviewed with patient? No  SHORT TERM GOALS: Target date: 09/18/22  I with HEP  Goal status: met  2.  Pt will demonstrate improved LUE functional use as evidenced by increasing increase box and blocks score by 3 blocks  Goal status: approximating 13 blocks  3.  Pt will verbalize understanding of AE/ DME options to increase safety and independence with ADLs/IADLs.  Goal status:  ongoing  4.  Pt will demonstrate LUE shoulder flexion to  85* with min compensation for improved functional reach.  Goal status: met 90*  5.  Pt will utilize LUE as a gross assist for ADLs at least 50% of the time with pain no greater than 4/10.  Goal status: partially met uses >50%, however pain 6/10    LONG TERM GOALS: Target date: 11/17/22  I with updated HEP.  Goal status ongoing  2.  Pt will report ability to perform simple garden task with AE/DME prn and supervision, following assist for set up.  Goal status:  ongoing  3.  Pt will demonstrate 90* shoulder flexion with min compensation for increased LUE functional use. Goal status:  ongoing  4.  Pt will demonstrate improved LUE functional use as evidenced by  increasing increase box and blocks score by 6 blocks from eval: Revised goal Pt will improve box blocks score  to 16 blocks total. (Score 13 blocks on 10/23/22)  Goal status: ongoing   ASSESSMENT:  CLINICAL IMPRESSION: Patient is progressing towards goals. Pt reports being able to use her hand functionally more at home now and she states sensation is improving in left arm. Pt had an excellent session today and she demonstrates better LUE control. PERFORMANCE DEFICITS: in functional skills including ADLs, IADLs, coordination, dexterity, sensation, tone, ROM, strength, pain, flexibility, Fine motor control, Gross motor control, mobility, endurance, decreased knowledge of precautions, decreased knowledge of use of DME, and UE functional use, cognitive skills including safety awareness, and psychosocial skills including coping strategies, environmental adaptation, habits, interpersonal interactions, and routines and behaviors.   IMPAIRMENTS: are limiting patient from ADLs, IADLs, rest and sleep, work, play, leisure, and social participation.   CO-MORBIDITIES: may have co-morbidities  that affects occupational performance. Patient will benefit from skilled OT to address above impairments and improve overall function.  MODIFICATION OR ASSISTANCE TO COMPLETE EVALUATION: No modification of tasks or assist necessary to complete an evaluation.  OT OCCUPATIONAL PROFILE AND HISTORY: Detailed assessment: Review of records and additional review of physical, cognitive, psychosocial history related to current functional performance.  CLINICAL DECISION MAKING: LOW - limited treatment options, no task modification necessary  REHAB POTENTIAL: Good  EVALUATION COMPLEXITY: Low    PLAN:  OT FREQUENCY: 1x/week  OT DURATION: 12 weeks plus eval, may d/c after 8 weeks dependent on progress.  PLANNED INTERVENTIONS: self care/ADL training, therapeutic exercise, therapeutic activity, neuromuscular re-education,  manual therapy, passive range of motion, balance training, functional mobility training, aquatic therapy, splinting, ultrasound, paraffin, moist heat, cryotherapy, contrast bath, patient/family education, cognitive remediation/compensation, energy conservation, coping strategies training, DME and/or AE  instructions, and Re-evaluation  RECOMMENDED OTHER SERVICES: n/a  CONSULTED AND AGREED WITH PLAN OF CARE: Patient  PLAN FOR NEXT SESSION:   NMR, functional use of LUE   Sahaana Weitman, OT 10/30/2022, 3:28 PM

## 2022-11-06 ENCOUNTER — Ambulatory Visit: Payer: Medicare Other | Admitting: Occupational Therapy

## 2022-11-06 DIAGNOSIS — R2681 Unsteadiness on feet: Secondary | ICD-10-CM

## 2022-11-06 DIAGNOSIS — R29818 Other symptoms and signs involving the nervous system: Secondary | ICD-10-CM

## 2022-11-06 DIAGNOSIS — M6281 Muscle weakness (generalized): Secondary | ICD-10-CM | POA: Diagnosis not present

## 2022-11-06 DIAGNOSIS — I69154 Hemiplegia and 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 left non-dominant side: Secondary | ICD-10-CM

## 2022-11-06 DIAGNOSIS — R278 Other lack of coordination: Secondary | ICD-10-CM

## 2022-11-06 DIAGNOSIS — R208 Other disturbances of skin sensation: Secondary | ICD-10-CM

## 2022-11-06 DIAGNOSIS — R2689 Other abnormalities of gait and mobility: Secondary | ICD-10-CM

## 2022-11-06 DIAGNOSIS — I69318 Other symptoms and signs involving cognitive functions following cerebral infarction: Secondary | ICD-10-CM

## 2022-11-06 NOTE — Therapy (Signed)
OUTPATIENT OCCUPATIONAL THERAPY NEURO Treatment   Patient Name: Charlotte Hunter MRN: [id number] DOB:08-Jun-1955, 67 y.o., female Today's Date: 11/06/2022  PCP: Dr. Syble Creek REFERRING PROVIDER: Dr. Wynn Banker   END OF SESSION:  OT End of Session - 11/06/22 1433     Visit Number 9    Number of Visits 13    Date for OT Re-Evaluation 11/17/22    Authorization Type [employer]    Authorization - Visit Number 9    Authorization - Number of Visits 10    Progress Note Due on Visit 10    OT Start Time 1320    OT Stop Time 1405    OT Time Calculation (min) 45 min            OCCUPATIONAL THERAPY DISCHARGE SUMMARY    Current functional level related to goals / functional outcomes: Pt made good overall progress. She did not fully achieve goals due to sensory deficits and pain.   Remaining deficits: Sensory deficits, decreased coordiantion, pain, decreased strength, decreased functional mobility, decreased balance   Education / Equipment: Pt was educated regarding HEP. She demonstrates understanding.   Patient agrees to discharge. Patient goals were partially met. Patient is being discharged due to being pleased with the current functional level..          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   ICH (intracerebral hemorrhage) ([name]) 10/01/2021   Paroxysmal atrial fibrillation ([name]) 09/29/2021   Stenosis of intracranial vessel 09/29/2021   Essential hypertension 09/29/2021   Blurry vision 09/29/2021   GERD (gastroesophageal reflux disease) 09/29/2021   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  09/25/2021    ONSET DATE: 07/11/22- referral date  REFERRING DIAG:  Diagnosis  I61.9 (ICD-10-CM) - Hemorrhagic stroke ([name])    THERAPY DIAG:  Muscle weakness (generalized)  Hemiplegia and 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 left non-dominant side ([name])  Other lack of coordination  Other symptoms and signs involving the nervous system  Other symptoms and signs involving cognitive functions following cerebral infarction  Other disturbances of skin sensation  Unsteadiness on feet  Other abnormalities of gait and mobility  Rationale for Evaluation and Treatment: Rehabilitation  SUBJECTIVE:   SUBJECTIVE STATEMENT: Pt states she is ready for d/c today  PERTINENT HISTORY: 67 year old female here for 3evaluation of right thalamic intracerebral hemorrhage. Patient is was admitted to the hospital in June 2023 for new onset left-sided weakness headache and nausea. She was found to have intracerebral hemorrhage in the right thalamus. Patient was on Eliquis anticoagulation for atrial fibrillation at the time and this was reversed medically. She was started on hypertonic saline, blood pressure control in ICU admission. Patient went to inpatient rehabilitation. Now patient back home. Still has left-sided weakness and left hand and arm and leg pain. Has been to cardiology who recommended to hold anticoagulation for now as patient is not in atrial fibrillation.    Past medical history of atrial fibrillation on Eliquis aortic insufficiency, hypertension, fibromyalgia Pt received OT at [address] And was d/c in February  PRECAUTIONS: Fall  WEIGHT BEARING RESTRICTIONS: No  PAIN:  Are you having pain? Yes: NPRS scale: 6-7/10 Pain location: left hand, arm, foot  Pain description: sharp, burning Aggravating factors: use Relieving factors: meds, rest  FALLS: Has patient fallen in last 6 months? 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  PLOF: Independent  PATIENT GOALS: continue to increase functional use of LUE  OBJECTIVE:   HAND DOMINANCE: Right  ADLs: Overall ADLs: Pt is performing basic ADLs mod I  Transfers/ambulation related to ADLs: Eating: mod I Grooming: mod I UB Dressing: mod I LB Dressing: mod I Toileting: mod I uses walker and w/c Bathing: mod I has tub bench Tub Shower transfers: mod I Equipment: Transfer tub bench and bed side commode  IADLs: Shopping: dtr performs the grocery shopping  Light housekeeping: pt vacuums seated in w/c  Meal Prep: uses oven mod I Community mobility: supervision with walker, alos has w/c Medication management: mod I Financial management: mod I   MOBILITY STATUS:  supervision to mod I with walker    ACTIVITY TOLERANCE: Activity tolerance: Pt fatigues quickly with ambulation    UPPER EXTREMITY ROM:  RUE WFLS, LUE pt has full composite flexion. Pt has hyperextension at PIP joint and flexion at DIP for ring finger  Active ROM Right eval Left eval  Shoulder flexion  80 with elbow flexed  Shoulder abduction    Shoulder adduction    Shoulder extension    Shoulder internal rotation    Shoulder external rotation    Elbow flexion  WFLS  Elbow extension  -15  Wrist flexion  WFLs  Wrist extension    Wrist ulnar deviation    Wrist radial deviation    Wrist pronation  WFLS  Wrist supination  WFLS  (Blank rows = not tested)  HAND FUNCTION: Grip strength: Right: 65 lbs; Left: 18 lbs  COORDINATION: 9 Hole Peg test: Right: NT sec; Left: 3 pegs placed in 2 mins sec  SENSATION: LUE  near absent sensation  EDEMA: n/a  MUSCLE TONE: LUE: Mild and Hypertonic  COGNITION: Overall cognitive status: Within functional limits for tasks assessed    OBSERVATIONS: Pt became fatigued when walking to [place] requiring a w/c for rest. Pt was assisted into her friends car and she was  doing ok when she left.   TODAY'S TREATMENT:                                                                                                                              DATE: 11/06/22-Discussed plans for d/c and checked goals. See goals for progress Closed chain shoulder flexion with ball, min v.c and facilitation Weightbearing through bilateral UE's at sink rocking forwards and backwards, min v.c Reviewed yellow theraband exercises for rowing, pt returned demonstration. Rolling ball in circles on mat with LUE for increased control Discussed options  for tying shoes, spiral laces, shoe button and pt has a difficult time tying left shoe. Pt reports she may order AE for gardening in the future and has looked at items on line.     10/30/22- Pt transferred to mat mod I Seated on mat closed chain shoulder flexion and chest press with cane, then pt transitioned to holding medium ball between hands and she performed controlled chest press and shoulder flexion 2 sets of 5 reps, min v.c Seated weightbearing through L hand over ball while performing  small circles with ball on mat, min facilitation/ v.c Unilateral shoulder flexion pushing washcloth up incline x 10 reps for control, min v.c Open chain reaching with LUE to grasp cylindrical objects and place from mat to tabletop, min difficulty/ v.c Resisted chest press with yellow band followed by bilateral rowing with yellow band, 10 reps each. Therapist issued rowing for home, pt verbalized understanding of performance.  10/23/22 Pt reports her daughter moved out and that has improved her stress level. Pt/ therapist discussed activities she is performing at home and remaining challenges.  Seated on mat closed chin reach for the floor with cane, min v.c then closed chain chest press, shoulder flexion and rotation while holding ball between palms, min v.c Standing at sink rocking forwards and backwards for weightbearing through bilateral UE's, min  facilitation/ v.c Seated on mat with LUE on small ball, rolling ball forwards, back wards and in circles for increased control and weightbearing, min v.c Seated at table box and blocks and attempts at 9 hole peg test see goals.    10/07/22- Pt reports severe pain today in R side however pt desires to participate. Pt transferred w/c to mat with min a stand pivot due to pain. UE ranger for mid range shoulder flexion, circumduction, horizontal abduction, min facilitation. Towel scrunches for finger flexion/ extension, min v.c Flipping large playing cards then sliding them off the top of deck with RUE, min v.c / min-mod difficulty for increased control. Resisted rowing and chest press with yellow theraband attaches to cane, as well as resisted curls, min facilitation/ v.c    09/23/22- Pt ambulated back to treatment room with RW and supervision. BP initially 170/105 Pt was place in supine position and asked to rest for 6 mins, BP 161/87 Therapist called [facility] to ensure pt was ok to attend therapy today. Supine closed chain chest press, shoulder flexion, triceps extension with min facilitation and v.c for neck and shoulder positioning. Seated unilateral Shoulder flexion with vertical pole,  for support of RUE min-mod facilitation Pt was provided with info for purchase of oval 8 splints.  Pt requested assistance with toileting due to difficulty with removal and redressing in bathing suit. Min A for undressing, min assist to sit on toilet as there was no grab bar on right side. Sit to stand from toilet with min A, facilitation.   09/17/22-BP on arrival after walking from waiting room with close supervision was 156/99. Pt was provided with water and a rest break. Pt reports continued stress at home with her dtr. Pt was encouraged to consider a counselor. After rest break BP 151/87 Supine closed chain shoulder flexion and chest press with min-mod facilitation. Seated pt held a vertical pole with  LUE to perform shoulder flexion and circumduction, min facilitation/ v.c Oval 8 splints were tried on  ring(size 7) and middle fingers (size 8) due to hyperextension at PIP joints, with pt developing swan neck deformity. Therpist to provide information for purchase  next visit    09/02/22-Pt arrived in w/c for OT treatment. She reports going to the ED over the weekend with CVA symptoms.  Pt's testing was negative for CVA, and she was told that stress could bring on or amplify old CVA symptoms. BP 158/94 initially. Therapist encouraged pt to consider meeting with a counselor to help manage her stress. Pt is having stressful interactions with her dtr. but she reports she is in a safe environment. Pt's dtr is moving out in June. Deep breathing exercise performed, pt was encouraged to perform at home. Seated at the table pt practiced opening various sized containers using LUE to assist, min v.c and mod difficulty. Placing and removing large wooden dowel pegs with LUE, min difficulty removing, mod-max with replacing due to sensory deficits. Pt's BP remained elevated at end of session 148/93 so her PT session at the pool was cancelled after discussion with Rosalita Chessman [facility] PT.   08/26/22 supine closed chain shoulder flexion with PVC pipe frame, min v.c and facilitation Seated closed chain reaching for the floor with bilateral UE's min v.c Seated and standing, UE ranger for shoulder flexion and circumduction, min v.c / min-mod facilitation Seated weightbearing through Left elbow, min facilitation/ v.c Functional reaching into eye level cabinets to place and remove lightweight items, mod difficulty, min facilitation/ v.c supervision for safety.  PATIENT EDUCATION: Education details:  reviewed rowing with yellow band, discussed progress towards goals, plans for d/c Person educated: Patient Education method: Explanation, Demonstration, Tactile cues, Verbal cues, Education comprehension: verbalized  understanding, returned demonstration, and verbal cues required  HOME EXERCISE PROGRAM: Unilateral shoulder flexion with vertical pole Closed chain Rowing with band   GOALS: Goals reviewed with patient? No  SHORT TERM GOALS: Target date: 09/18/22  I with HEP  Goal status: met  2.  Pt will demonstrate improved LUE functional use as evidenced by increasing increase box and blocks score by 3 blocks  Goal status: approximating 13 blocks  3.  Pt will verbalize understanding of AE/ DME options to increase safety and independence with ADLs/IADLs.  Goal status:  met, discussed elastic laces and long handled equipment for gardening, has tub bench  4.  Pt will demonstrate LUE shoulder flexion to  85* with min compensation for improved functional reach.  Goal status: met 90*  5.  Pt will utilize LUE as a gross assist for ADLs at least 50% of the time with pain no greater than 4/10.  Goal status: partially met uses >50%, however pain 6/10    LONG TERM GOALS: Target date: 11/17/22  I with updated HEP.  Goal status met  2.  Pt will report ability to perform simple garden task with AE/DME prn and supervision, following assist for set up.  Goal status: partially met- sits on rollator , min A, pt is investigating AE for gardening  3.  Pt will demonstrate 90* shoulder flexion with min compensation for increased LUE functional use. Goal status: met, demonstrates 90* with min compensations, 115 with mod compensations  4.  Pt will demonstrate improved LUE functional use as evidenced by increasing increase box and blocks score by 6 blocks from eval:  Revised goal Pt will improve box blocks score  to 16 blocks total. (Score 13 blocks on 10/23/22)  Goal status: not met, 12, 14 blocks 11/06/22   ASSESSMENT:  CLINICAL IMPRESSION: Pt demonstrates good overall progress. She requests d/c from OT as she is doing well with ADLs/IADLS at home and she demonstrates better functional use of  LUE.  PERFORMANCE DEFICITS: in functional skills including ADLs, IADLs, coordination, dexterity, sensation, tone, ROM, strength, pain, flexibility, Fine motor control, Gross motor control, mobility, endurance, decreased knowledge of precautions, decreased knowledge of use of DME, and UE functional use, cognitive skills including safety awareness, and psychosocial skills including coping strategies, environmental adaptation, habits, interpersonal interactions, and routines and behaviors.   IMPAIRMENTS: are limiting patient from ADLs, IADLs, rest and sleep, work, play, leisure, and social participation.   CO-MORBIDITIES: may have co-morbidities  that affects occupational performance. Patient will benefit from skilled OT to address above impairments and improve overall function.  MODIFICATION OR ASSISTANCE TO COMPLETE EVALUATION: No modification of tasks or assist necessary to complete an evaluation.  OT OCCUPATIONAL PROFILE AND HISTORY: Detailed assessment: Review of records and additional review of physical, cognitive, psychosocial history related to current functional performance.  CLINICAL DECISION MAKING: LOW - limited treatment options, no task modification necessary  REHAB POTENTIAL: Good  EVALUATION COMPLEXITY: Low    PLAN:  OT FREQUENCY: 1x/week  OT DURATION: 12 weeks plus eval, may d/c after 8 weeks dependent on progress.  PLANNED INTERVENTIONS: self care/ADL training, therapeutic exercise, therapeutic activity, neuromuscular re-education, manual therapy, passive range of motion, balance training, functional mobility training, aquatic therapy, splinting, ultrasound, paraffin, moist heat, cryotherapy, contrast bath, patient/family education, cognitive remediation/compensation, energy conservation, coping strategies training, DME and/or AE instructions, and Re-evaluation  RECOMMENDED OTHER SERVICES: n/a  CONSULTED AND AGREED WITH PLAN OF CARE: Patient  PLAN FOR NEXT SESSION:  d/c  OT   Kyasia Steuck, OT 11/06/2022, 2:35 PM

## 2022-11-19 ENCOUNTER — Encounter: Payer: Self-pay | Admitting: Physical Medicine & Rehabilitation

## 2022-11-19 ENCOUNTER — Encounter: Payer: Medicare Other | Attending: Physical Medicine & Rehabilitation | Admitting: Physical Medicine & Rehabilitation

## 2022-11-19 ENCOUNTER — Other Ambulatory Visit: Payer: Self-pay | Admitting: Physical Medicine & Rehabilitation

## 2022-11-19 VITALS — BP 125/75 | HR 70 | Ht 66.0 in

## 2022-11-19 DIAGNOSIS — G890 Central pain syndrome: Secondary | ICD-10-CM | POA: Diagnosis not present

## 2022-11-19 MED ORDER — GABAPENTIN 300 MG PO CAPS: 300.0000 mg | ORAL_CAPSULE | Freq: Four times a day (QID) | ORAL | 0 refills | Status: DC

## 2022-11-19 MED ORDER — LOSARTAN POTASSIUM 100 MG PO TABS: 50.0000 mg | ORAL_TABLET | Freq: Every day | ORAL | Status: AC

## 2022-11-19 MED ORDER — METHOCARBAMOL 500 MG PO TABS: ORAL_TABLET | ORAL | 1 refills | Status: DC

## 2022-11-19 MED ORDER — TIZANIDINE HCL 2 MG PO TABS
4.0000 mg | ORAL_TABLET | Freq: Every day | ORAL | 5 refills | Status: DC
Start: 2022-11-19 — End: 2023-05-06

## 2022-11-19 NOTE — Progress Notes (Signed)
Subjective:    Patient ID: Charlotte Hunter, female    DOB: [birth year], 67 y.o.   MRN: [id number] 67 y.o. right-handed female with history of hypertension, recurrent UTIs maintained on prophylactic Macrodantin aortic insufficiency, PAF with history of ablation maintained on Eliquis.  Her latest cardiology notes that she was planning Xarelto to see if this may be more affordable for her however it was never started and she was maintained on Eliquis.  Per chart review patient lives alone.  Daughter in the area.  Presented 09/25/2021 with acute onset of left-sided weakness as well as blurred vision.  CT of the head showed hemorrhage centered in the right thalamus and basal ganglia with mild surrounding edema, minimal midline shift and a small amount of intraventricular extension into the third and fourth ventricle.  Patient was reversed with Andexxa.  Follow-up MRI/MRA unchanged size of morphology of acute intraparenchymal hemorrhage.  No hydrocephalus or trapping.  There was a noted focal severe distal left P2 stenosis on MRA.  Admission chemistries unremarkable except potassium 3.2 urinalysis negative.  Echocardiogram with ejection fraction of 65 to 70% no wall motion abnormalities.  Patient did require short-term intubation through 09/27/2021.  Maintained on a mechanical soft nectar thick liquid diet and advance to thin liquids 10/01/2021.   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  Repeat CT of the head 10/11/2021 showed fading blood products within the right thalamus.  Regional hypodensity persist in my reflect a combination of residual edema and developing myelomalacia.  Mild regional mass effect diminished since 10/02/2021.  No new intracranial abnormality.  Patient noted history of PAF at this time would remain off of Eliquis and follow-up with cardiology services Dr.Komado of [facility].  Cardiac rate remained  controlled.  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 She is now living on her own with family and friends support.  She needs help with shopping she does not drive.  She would like to resume driving.  She asked about the steps needed to proceed upon this goal. She has not seen ophthalmology since her stroke. She denies any falls.  She has completed outpatient PT OT speech. She had elevated blood pressure requiring an ED visit.  Pain Inventory Average Pain 7 Pain Right Now 6 My pain is intermittent, constant, burning, stabbing, tingling, and aching  LOCATION OF PAIN  left hand, left foot, left buttock  BOWEL Number of stools per week: 7 Oral laxative use Yes  Type of laxative ducolax Enema or suppository use No    BLADDER Normal    Mobility ability to climb steps?  yes do you drive?  no use a wheelchair  Function retired  Neuro/Psych numbness tingling trouble walking dizziness depression  Prior Studies Any changes since last visit?  no  Physicians involved in your care Seen at [facility] in 10/2022   No family history on file. Social History   Socioeconomic History   Marital status: Single    Spouse name: Not on file   Number of children: 1   Years of education:  Not on file   Highest education level: Not on file  Occupational History   Not on file  Tobacco Use   Smoking status: Never   Smokeless tobacco: Never  Vaping Use   Vaping status: Never Used  Substance and Sexual Activity   Alcohol  use: Not Currently    Alcohol/week: 1.0 standard drink of alcohol    Types: 1 Glasses of wine per week   Drug use: Never   Sexual activity: Not Currently  Other Topics Concern   Not on file  Social History Narrative   12/27/21 dgtr lives with her   Social Determinants of Health   Financial Resource Strain: Low Risk  (03/29/2020)   Received from [facility]   Overall Financial Resource Strain (CARDIA)    Difficulty of Paying Living Expenses: Not very hard  Food Insecurity: No Food Insecurity (07/24/2020)   Received from [facility]   Hunger Vital Sign    Worried About Running Out of Food in the Last Year: Never true    Ran Out of Food in the Last Year: Never true  Transportation Needs: No Transportation Needs (03/29/2020)   Received from [facility] - Transportation    Lack of Transportation (Medical): No    Lack of Transportation (Non-Medical): No  Physical Activity: Insufficiently Active (03/29/2020)   Received from [facility]   Exercise Vital Sign    Days of Exercise per Week: 2 days    Minutes of Exercise per Session: 30 min  Stress: No Stress Concern Present (03/29/2020)   Received from [facility] of Occupational Health - Occupational Stress Questionnaire    Feeling of Stress : Only a little  Social Connections: Unknown (08/28/2021)   Received from [facility]   Social Network    Social Network: Not on file   Past Surgical History:  Procedure Laterality Date   ABDOMINAL HYSTERECTOMY     ABLATION  05/03/2021   heart   c section  01/1968   KNEE SURGERY Right    meniscus   SHOULDER SURGERY Right 08/2014   Past Medical History:  Diagnosis Date   A-fib [facility])    Aortic insufficiency    Hypertension    Stroke ([name])    BP 125/75   Pulse 70   Ht 5\' 6"  (1.676 m)   SpO2 95%   BMI 28.60 kg/m   Opioid Risk Score:   Fall Risk Score:  `1  Depression screen [facility] 2/9     07/11/2022    1:06 PM 04/11/2022   12:08 PM  02/19/2022    2:01 PM 12/04/2021    1:22 PM  Depression screen PHQ 2/9  Decreased Interest 0 0 0 0  Down, Depressed, Hopeless 0 0 0 0  PHQ - 2 Score 0 0 0 0  Altered sleeping    0  Tired, decreased energy    0  Change in appetite    0  Feeling bad or failure about yourself     0  Trouble concentrating    0  Moving slowly or fidgety/restless    0  Suicidal thoughts    0  PHQ-9 Score    0    Review of Systems  Musculoskeletal:  Positive for gait problem.  Neurological:  Positive for dizziness and numbness.       Tingling  Psychiatric/Behavioral:         Depression  All other systems reviewed and are negative.  Objective:   Physical Exam  Right upper extremity right lower extremity strength and sensation are normal Left upper extremity strength is 4 - at the deltoid bicep tricep finger flexors and extensor.  Her movements in the hand are very slow she does have some swan-neck deformities in the fingers and sometimes needs to bend her PIPs with her right hand to help with flexion. Sensation is absent to light touch as well as temperature in the left upper extremity.  There is reduced sensation to temperature in the left lower extremity as well as absent sensation in the left lower extremity to light touch. Finger to thumb opposition is very slow on the left side. Finger-nose-finger testing shows evidence of sensory ataxia moderate. Sit to stand is modified independent.  She does not have a cane with her today so she did not demonstrate walking.  She wears a left AFO mainly for proprioception and to reduce risk of ankle inversion injury     Assessment & Plan:  #1.  History of right thalamic hemorrhage combination of elevated blood pressures and oral anticoagulant usage. She has made excellent recovery over the last year.  She is back to independent living with some support for shopping.  We discussed return to driving including first having an ophthalmology or optometry evaluation  of visual fields.  If she passes this then she can start practicing with another licensed driver as below. Graduated return to driving instructions were provided. It is recommended that the patient first drives with another licensed driver in an empty parking lot. If the patient does well with this, and they can drive on a quiet street with the licensed driver. If the patient does well with this they can drive on a busy street with a licensed driver. If the patient does well with this, the next time out they can go by himself. For the first month after resuming driving, I recommend no nighttime or Interstate driving.    She still has some muscle spasms she uses tizanidine for 2 mg at night since this is sedating. During the day she is Robaxin 500 mg p.o. as needed but not on a daily basis. We discussed pros and cons of gabapentin versus other medications for neurogenic pain.  We discussed there is no specific medicine for post thalamic pain syndrome.  She is having some dizziness.  Will reduce gabapentin to 300 mg 4 times daily.  Follow-up call in about 4 weeks to let us know how it is helping with pain.  Consider other options including Lyrica versus retrial of duloxetine.

## 2022-11-19 NOTE — Patient Instructions (Addendum)
Please have eye dr do a visual field test to make sure you have no blind spots  If that is ok.  See the following  Graduated return to driving instructions were provided. It is recommended that the patient first drives with another licensed driver in an empty parking lot. If the patient does well with this, and they can drive on a quiet street with the licensed driver. If the patient does well with this they can drive on a busy street with a licensed driver. If the patient does well with this, the next time out they can go by himself. For the first month after resuming driving, I recommend no nighttime or Interstate driving.   Please call ablut

## 2022-11-25 ENCOUNTER — Telehealth: Payer: Self-pay | Admitting: Physical Medicine & Rehabilitation

## 2022-11-25 MED ORDER — GABAPENTIN 400 MG PO CAPS: 400.0000 mg | ORAL_CAPSULE | Freq: Four times a day (QID) | ORAL | 5 refills | Status: DC

## 2022-11-25 NOTE — Addendum Note (Signed)
Addended by: Erick Colace on: 11/25/2022 01:17 PM   Modules accepted: Orders

## 2022-11-25 NOTE — Telephone Encounter (Signed)
Patient would like a call back--patient needing to go back to her 400mg  dose of Gabapentin--she said the 300 isn't working for her.

## 2022-11-25 NOTE — Telephone Encounter (Signed)
Patient has decided that despite side effects mentioned at last visit she would like to remain on Gabapentin 400  The rx sent on last week was not filled for Gabapentin 300 mg.Marland Kitchen

## 2023-02-10 ENCOUNTER — Telehealth: Payer: Self-pay | Admitting: Diagnostic Neuroimaging

## 2023-02-10 ENCOUNTER — Telehealth: Payer: Self-pay | Admitting: Physical Medicine & Rehabilitation

## 2023-02-10 NOTE — Telephone Encounter (Signed)
Patient called in and states she is in unbearable pain that has now gotten worse , she said the current medications are not helping and has even tried lidocaine gel and not helping. She said it the L side of her body and also attempted to reach neurologist but was recommended to contact us . Offered a follow up appointment but patient states she has trouble with transportation at this time

## 2023-02-10 NOTE — Telephone Encounter (Signed)
Pt called and LVM stating that she is needing RN or MD to call to discuss some pains that she has been experiencing recently. Please advice.

## 2023-02-10 NOTE — Telephone Encounter (Signed)
Called the patient back. Pt is calling to check in because she has not seen her neurologist. She called in with the same concerns in dec 2023 but basically sounds liked her symptoms are still occurring. She is still in pain and its like shooting pains on the left side. She is currently on gabapentin and this was increased by pain management MD. She was offered cymbalta which didn't help much..  I advised the patient that we do not treat pain management and per Dr Richrd Humbles notes it didn't mention anything about moving forward with work up or addressing these concerns. Advised the patient that I will bring it to his attention to determine if there is anything he recommends from neurological standpoint. If so, we would have to get an updated appointment on the books.  Pt also wanted to know if Dr Marjory Lies had advice or medical opinion on Enbrel. This medication is injection that can be given in neck and she has read that can help in stroke patients. Advised our office doesn't five this but would also ask Dr Marjory Lies had input on this matter.

## 2023-02-13 MED ORDER — GABAPENTIN 300 MG PO CAPS: 600.0000 mg | ORAL_CAPSULE | Freq: Three times a day (TID) | ORAL | 1 refills | Status: DC

## 2023-02-13 NOTE — Addendum Note (Signed)
Addended by: Erick Colace on: 02/13/2023 05:19 PM   Modules accepted: Orders

## 2023-02-14 ENCOUNTER — Other Ambulatory Visit: Payer: Self-pay | Admitting: Physical Medicine & Rehabilitation

## 2023-02-14 NOTE — Telephone Encounter (Signed)
I spoke with Charlotte Hunter and she asked if the gabapentin came in tablet form. There is a 600 mg tablet and it is preferred with her insurance. Is it ok to switch her to 600mg  tablet 1 tid?

## 2023-02-17 NOTE — Telephone Encounter (Signed)
Per phone room: Called and spoke to pt and she was offered 11/13 @3 :30pm but was not able to accept this date. Pt asked if there was any openings for 12/4 and RN put her in on that date at 2pm but it was explained to pt that it needed to be approved by provider and if he was not able to do that date and time she would be called back to r/s. Pt agreed and verbalized appreciation.

## 2023-02-20 NOTE — Telephone Encounter (Signed)
I called patient.  Reviewed nature of poststroke thalamic pain syndrome.  Recommend to continue pain management per Dr. Wynn Banker (gabapentin, lyric or duloxetine). Will cancel upcoming appointment with me.    Suanne Marker, MD 02/20/2023, 12:45 PM Certified in Neurology, Neurophysiology and Neuroimaging  [facility] [address] [place], [place] [address] [phone]

## 2023-02-20 NOTE — Telephone Encounter (Signed)
Apt cancelled as requested

## 2023-03-19 ENCOUNTER — Ambulatory Visit: Payer: Medicare Other | Admitting: Diagnostic Neuroimaging

## 2023-04-17 ENCOUNTER — Telehealth: Payer: Self-pay | Admitting: Physical Medicine & Rehabilitation

## 2023-04-17 NOTE — Telephone Encounter (Signed)
 Pharmacy has refilled medication for patient, called patient left VM

## 2023-04-17 NOTE — Telephone Encounter (Signed)
 REFILL ON TIZANADINE AND GABAPENTIN.  PLEASE CALL PATIENT.

## 2023-05-06 ENCOUNTER — Other Ambulatory Visit: Payer: Self-pay | Admitting: Physical Medicine & Rehabilitation

## 2023-05-12 ENCOUNTER — Telehealth: Payer: Self-pay | Admitting: Physical Medicine & Rehabilitation

## 2023-05-12 NOTE — Telephone Encounter (Signed)
Patient is calling asking for a refill for Gabapentin 600 MG

## 2023-05-15 MED ORDER — GABAPENTIN 600 MG PO TABS: 600.0000 mg | ORAL_TABLET | Freq: Three times a day (TID) | ORAL | 1 refills | Status: DC

## 2023-05-15 NOTE — Addendum Note (Signed)
Addended by: Erick Colace on: 05/15/2023 03:37 PM   Modules accepted: Orders

## 2023-05-22 ENCOUNTER — Encounter: Payer: Medicare Other | Admitting: Physical Medicine & Rehabilitation

## 2023-05-29 ENCOUNTER — Encounter: Payer: Self-pay | Admitting: Physical Medicine & Rehabilitation

## 2023-05-29 ENCOUNTER — Encounter: Payer: Medicare Other | Attending: Physical Medicine & Rehabilitation | Admitting: Physical Medicine & Rehabilitation

## 2023-05-29 VITALS — BP 129/70 | HR 76 | Ht 66.0 in

## 2023-05-29 DIAGNOSIS — G890 Central pain syndrome: Secondary | ICD-10-CM | POA: Insufficient documentation

## 2023-05-29 DIAGNOSIS — I639 Cerebral infarction, unspecified: Secondary | ICD-10-CM | POA: Diagnosis not present

## 2023-05-29 DIAGNOSIS — G8114 Spastic hemiplegia affecting left nondominant side: Secondary | ICD-10-CM | POA: Insufficient documentation

## 2023-05-29 MED ORDER — TIZANIDINE HCL 2 MG PO TABS: 2.0000 mg | ORAL_TABLET | Freq: Every day | ORAL | 5 refills | Status: DC

## 2023-05-29 MED ORDER — METHOCARBAMOL 500 MG PO TABS: ORAL_TABLET | ORAL | 1 refills | Status: DC

## 2023-05-29 MED ORDER — GABAPENTIN 600 MG PO TABS: 600.0000 mg | ORAL_TABLET | Freq: Three times a day (TID) | ORAL | 1 refills | Status: DC

## 2023-05-29 NOTE — Patient Instructions (Addendum)
Need to contact Dr Marjory Lies regarding anesthesia questions

## 2023-05-29 NOTE — Progress Notes (Signed)
Subjective:  68 y.o. right-handed female with history of hypertension, recurrent UTIs maintained on prophylactic Macrodantin aortic insufficiency, PAF with history of ablation maintained on Eliquis.  Her latest cardiology notes that she was planning Xarelto to see if this may be more affordable for her however it was never started and she was maintained on Eliquis.  Per chart review patient lives alone.  Daughter in the area.  Presented 09/25/2021 with acute onset of left-sided weakness as well as blurred vision.  CT of the head showed hemorrhage centered in the right thalamus and basal ganglia with mild surrounding edema, minimal midline shift and a small amount of intraventricular extension into the third and fourth ventricle.  Patient was reversed with Andexxa.  Follow-up MRI/MRA unchanged size of morphology of acute intraparenchymal hemorrhage.  No hydrocephalus or trapping.  There was a noted focal severe distal left P2 stenosis on MRA.  Admission chemistries unremarkable except potassium 3.2 urinalysis negative.  Echocardiogram with ejection fraction of 65 to 70% no wall motion abnormalities.  Patient did require short-term intubation through 09/27/2021.  Maintained on a mechanical soft nectar thick liquid diet and advance to thin liquids 10/01/2021.   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  Repeat CT of the head 10/11/2021 showed fading blood products within the right thalamus.  Regional hypodensity persist in my reflect a combination of residual edema and developing myelomalacia.  Mild regional mass effect diminished since 10/02/2021.  No new intracranial abnormality.  Patient noted history of PAF at this time would remain off of Eliquis and follow-up with cardiology services Dr.Komado of [facility].  Cardiac rate remained controlled.  Pain management discomfort left lower extremity neuropathic due to the  infarction maintained on gabapentin and titrated as needed patient also maintained on Zanaflex 2 mg nightly with the addition of Lidoderm patch and Cymbalta.  Admit date: 10/01/2021 Discharge date: 11/06/2021  Patient ID: Charlotte Hunter, female    DOB: 1955-12-29, 68 y.o.   MRN: [id number]  HPI 68 year old female with history of right thalamic intracranial hemorrhage approximately 2 years ago Back in her own home, had 1 fall about 3 months ago.  Still is in [facility] much of the time  Does ambulate with quad cane in the right hand   Feels some improvement in temperature sensation LUE Takes tizanidine at bedtime The patient states the gabapentin is helpful for her left-sided burning pain however it does make her feel little drowsy.  We had tried reducing dosing from 600 mg 3 times daily to 300 mg 3 times daily but this was not helpful for her.  She also had tried 400 mg 4 times daily but once again the 600 mg 3 times daily seem to work better.  We discussed other treatment options including pregabalin which can cause similar side effects Pain Inventory Average Pain 9 Pain Right Now 7 My pain is constant, sharp, burning, dull, stabbing, tingling, aching, and freezing, sparks  LOCATION OF PAIN left hand, left elbow, left torso, left buttock, left leg, left foot & ankle  BOWEL Number of stools per week: 3-7 Oral laxative use No    BLADDER Normal     Mobility use a cane use a walker ability to climb steps?  yes do you drive?  no use a wheelchair needs help with transfers Do you have any goals in this area?  yes  Function retired Do you have any goals in this area?  yes  Neuro/Psych weakness numbness tingling trouble walking spasms depression  Prior Studies Any changes since last visit?  yes x-rays Right knee [facility], on [address] [employer] in [place] by Dr. Windell Hummingbird  Physicians involved in your care Any changes since last visit?  no   No family history on  file. Social History   Socioeconomic History   Marital status: Single    Spouse name: Not on file   Number of children: 1   Years of education: Not on file   Highest education level: Not on file  Occupational History   Not on file  Tobacco Use   Smoking status: Never   Smokeless tobacco: Never  Vaping Use   Vaping status: Never Used  Substance and Sexual Activity   Alcohol use: Not Currently    Alcohol/week: 1.0 standard drink of alcohol    Types: 1 Glasses of wine per week   Drug use: Never   Sexual activity: Not Currently  Other Topics Concern   Not on file  Social History Narrative   12/27/21 dgtr lives with her   Social Drivers of Health   Financial Resource Strain: Low Risk  (03/29/2020)   Received from [facility]   Overall Financial Resource Strain (CARDIA)    Difficulty of Paying Living Expenses: Not very hard  Food Insecurity: No Food Insecurity (07/24/2020)   Received from [facility], [facility]   Hunger Vital Sign    Worried About Running Out of Food in the Last Year: Never true    Ran Out of Food in the Last Year: Never true  Transportation Needs: No Transportation Needs (03/29/2020)   Received [facility]   PRAPARE - Transportation    Lack of Transportation (Medical): No    Lack of Transportation (Non-Medical): No  Physical Activity: Insufficiently Active (03/29/2020)   Received from [facility], [facility]   Exercise Vital Sign    Days of Exercise per Week: 2 days    Minutes of Exercise per Session: 30 min  Stress: No Stress Concern Present (03/29/2020)   Received from [facility] of [facility]ational Health - Occupational Stress Questionnaire    Feeling of Stress : Only a little  Social Connections: Unknown (08/28/2021)   Received from [facility], [facility]   Social Network    Social Network: Not on file   Past Surgical History:  Procedure Laterality Date    ABDOMINAL HYSTERECTOMY     ABLATION  05/03/2021   heart   c section  01/1968   KNEE SURGERY Right    meniscus   SHOULDER SURGERY Right 08/2014   Past Medical History:  Diagnosis Date   A-fib [facility])    Aortic insufficiency    Hypertension    Stroke ([name])    There were no vitals taken for this visit.  Opioid Risk Score:   Fall Risk Score:  `1  Depression screen [facility] 2/9     07/11/2022    1:06 PM 04/11/2022   12:08 PM 02/19/2022    2:01 PM 12/04/2021    1:22 PM  Depression screen PHQ 2/9  Decreased Interest 0 0 0 0  Down, Depressed, Hopeless 0 0 0 0  PHQ - 2 Score 0 0 0 0  Altered sleeping    0  Tired, decreased energy    0  Change in appetite    0  Feeling bad or failure about yourself  0  Trouble concentrating    0  Moving slowly or fidgety/restless    0  Suicidal thoughts    0  PHQ-9 Score    0    Review of Systems  Musculoskeletal:  Negative for gait problem.       Left side weakness spasms  Neurological:        Tingling  Psychiatric/Behavioral:         Depression  All other systems reviewed and are negative.      Objective:   Physical Exam General No acute distress Mood and affect appropriate Motor strength is 3 months at the left deltoid bicep tricep finger flexors and extensors left lower extremity 3 months at the hip flexor 4 - knee extensor 3 - ankle dorsiflexor Right upper extremity is 5/5 throughout Right lower extremity 5/5 throughout Sensation is absent to light touch in the left upper limb and left lower limb Ambulates with hand-held assist min to mod support using left AFO no evidence of toe drag or knee buckling       Assessment & Plan:   1.  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  Continue tizanidine 2 mg at night.  The patient also takes Robaxin on a infrequent basis we discussed that usually has not taken in conjunction with tizanidine.  She states that this is helpful for pain on her flank area 2.  Thalamic pain  syndrome continue gabapentin 600 mg 3 times daily we discussed at length treatment options for this we discussed that there is no specific treatment for this Greater than 50% of 30-minute visit was spent discussing treatment options  We also discussed operative general versus spinal anesthesia for her total knee replacement.  We discussed that neurology would have to approve her for general anesthesia.

## 2023-05-30 ENCOUNTER — Telehealth: Payer: Self-pay | Admitting: Physical Medicine & Rehabilitation

## 2023-05-30 NOTE — Telephone Encounter (Signed)
Pt called and stated Charlotte Hunter informed her to buy a something off [facility] for her particular need. She stated what exactly does she need to purchase?

## 2023-06-03 NOTE — Telephone Encounter (Signed)
Charlotte Hunter wanted to know which method of red light therapy is best for her ( helmet, pads, or lamp)? How much voltage will she need? Does it work?

## 2023-06-10 NOTE — Telephone Encounter (Signed)
 Patient informed of the reply.

## 2023-06-11 ENCOUNTER — Ambulatory Visit: Payer: Medicare Other | Admitting: Physical Therapy

## 2023-07-09 ENCOUNTER — Ambulatory Visit: Attending: Physical Medicine & Rehabilitation | Admitting: Physical Therapy

## 2023-07-09 DIAGNOSIS — G890 Central pain syndrome: Secondary | ICD-10-CM | POA: Insufficient documentation

## 2023-07-09 DIAGNOSIS — G8114 Spastic hemiplegia affecting left nondominant side: Secondary | ICD-10-CM | POA: Insufficient documentation

## 2023-07-09 DIAGNOSIS — R278 Other lack of coordination: Secondary | ICD-10-CM | POA: Insufficient documentation

## 2023-07-09 DIAGNOSIS — R29818 Other symptoms and signs involving the nervous system: Secondary | ICD-10-CM | POA: Insufficient documentation

## 2023-07-09 DIAGNOSIS — R2689 Other abnormalities of gait and mobility: Secondary | ICD-10-CM | POA: Diagnosis present

## 2023-07-09 DIAGNOSIS — M6281 Muscle weakness (generalized): Secondary | ICD-10-CM | POA: Insufficient documentation

## 2023-07-09 DIAGNOSIS — R2681 Unsteadiness on feet: Secondary | ICD-10-CM | POA: Insufficient documentation

## 2023-07-09 DIAGNOSIS — I639 Cerebral infarction, unspecified: Secondary | ICD-10-CM | POA: Insufficient documentation

## 2023-07-09 NOTE — Therapy (Signed)
 OUTPATIENT PHYSICAL THERAPY NEURO EVALUATION   Patient Name: Charlotte Hunter MRN: [id number] DOB:06-18-1955, 68 y.o., female Today's Date: 07/09/2023   PCP: Audery Amel, MD REFERRING PROVIDER: Erick Colace, MD  END OF SESSION:  PT End of Session - 07/09/23 1309     Visit Number 1    Number of Visits 9   with eval   Date for PT Re-Evaluation 10/01/23   to allow for scheduling delays   Authorization Type Medicare    PT Start Time 1305    PT Stop Time 1410    PT Time Calculation (min) 65 min    Equipment Utilized During Treatment Gait belt    Activity Tolerance Patient tolerated treatment well    Behavior During Therapy WFL for tasks assessed/performed             Past Medical History:  Diagnosis Date   A-fib [facility])    Aortic insufficiency    Hypertension    Stroke [facility])    Past Surgical History:  Procedure Laterality Date   ABDOMINAL HYSTERECTOMY     ABLATION  05/03/2021   heart   c section  01/1968   KNEE SURGERY Right    meniscus   SHOULDER SURGERY Right 08/2014   Patient Active Problem List   Diagnosis Date Noted   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   ICH (intracerebral hemorrhage) ([name]) 10/01/2021   Paroxysmal atrial fibrillation ([name]) 09/29/2021   Stenosis of intracranial vessel 09/29/2021   Essential hypertension 09/29/2021   Blurry vision 09/29/2021   GERD (gastroesophageal reflux disease) 09/29/2021   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 09/25/2021    ONSET DATE: 05/29/2023  REFERRING DIAG: G89.0 (ICD-10-CM) - Thalamic pain syndrome I63.9,G81.14 (ICD-10-CM) - 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 Pt reports that she is currently only using her [facility] for mobility at home and in the community, not using her walker anymore. Pt states, "Everything is happening organically" regarding improvements she has seen over the past few months. Pt recounts dropping a  creamer container filled with water on the floor (fell out of her L hand) and being able to brace herself with her R hand on a desk to lean down to the floor to retrieve the container. ***  Intermittent numbness in L side of face and her LLE  Using all terrain rollator for gardening with brakes on around on her deck  Pushing herself little by little  Uses cane to get to railing of her ramp  Able to put quilt on the bed  Asking about AFO modifications  Pt accompanied by: self  PERTINENT HISTORY: *** Past medical history of atrial fibrillation on Eliquis aortic insufficiency, hypertension, fibromyalgia, L hemi  PAIN:  Are you having pain? Yes: NPRS scale: *** Pain location: lateral portion of L side of body Pain description: stabbing in heel, then changes to extreme numbness, can feel hot or freezing cold; numbness so bad you want to chop it off; feels rigid in trunk by end of the day, arm and leg get heavy Aggravating factors: nothing in particular, gets worse as the day goes on Relieving factors: gabapentin along with a muscle relaxer, lidocaine gel; laying down  ***ongoing thalamic pain along L side of the body  PRECAUTIONS: Fall  RED FLAGS: None   WEIGHT BEARING RESTRICTIONS: No  FALLS: Has patient fallen in last 6 months? No  LIVING ENVIRONMENT: Lives with: lives alone Lives in: House/apartment Stairs: {opstairs:27293} Has  following equipment at home: Chiropractor, Environmental consultant - 2 wheeled, Wheelchair (manual), Tour manager, and Ramped entry  PLOF: Independent with basic ADLs, Independent with gait, Independent with transfers, and Requires assistive device for independence  PATIENT GOALS: ***"to work on L hip rotation, pool would give me less gravity to work with" "have my ankle and foot massage for circulation and strength"  OBJECTIVE:  Note: Objective measures were completed at Evaluation unless otherwise noted.  DIAGNOSTIC FINDINGS: ***None updated and relevant to this POC  COGNITION: Overall cognitive status: {cognition:24006}   SENSATION: {sensation:27233} Impaired in L hemibody***  COORDINATION: ***  EDEMA:  {edema:24020} Mild in LLE  POSTURE: rounded shoulders and forward head  LOWER EXTREMITY ROM:     Active  Right Eval Left Eval  Hip flexion    Hip extension    Hip abduction    Hip adduction    Hip internal rotation    Hip external rotation    Knee flexion    Knee extension    Ankle dorsiflexion    Ankle plantarflexion    Ankle inversion    Ankle eversion     (Blank rows = not tested)  LOWER EXTREMITY MMT:    MMT Right Eval Left Eval  Hip flexion 5 3+  Hip extension    Hip abduction    Hip adduction    Hip internal rotation    Hip external rotation    Knee flexion 5 2-  Knee extension 5 4  Ankle dorsiflexion 4 2  Ankle plantarflexion    Ankle inversion    Ankle eversion    (Blank rows = not tested)  BED MOBILITY:  Mod I per patient report, utilizes bed rail  TRANSFERS: Assistive device utilized: Chiropractor  Sit to stand: Modified independence Stand to sit: Modified independence Chair to chair: Modified independence Floor:  not assessed at eval  GAIT: Gait pattern:  L hip IR, decreased step length- Left, decreased hip/knee flexion- Left, and decreased ankle dorsiflexion- Left Distance walked: various clinic distances Assistive device  utilized:  Quad cane large base Level of assistance: Modified independence Comments: patient ambulates in/out of appointment with her [facility], does need increased time  FUNCTIONAL TESTS:    [facility] PT Assessment - 07/09/23 1334       Ambulation/Gait   Gait velocity 32.8 ft over 70 sec = 0.47 ft/sec   with [facility]     Standardized Balance Assessment   Standardized Balance Assessment Timed Up and Go Test;Five Times Sit to Stand    Five times sit to stand comments  19.94 sec      Timed Up and Go Test   TUG Normal TUG    Normal TUG (seconds) 66   with [facility]                                                                                                                                       TREATMENT: PT Evaluation    PATIENT EDUCATION: Education details: Eval findings, PT POC Person educated: Patient Education method: Explanation Education comprehension: verbalized understanding and needs further education  HOME EXERCISE PROGRAM: To be reviewed/initiated  GOALS: Goals reviewed with patient? Yes  SHORT TERM GOALS: Target date: 08/20/2023  Pt will be Supervision with initial aquatic HEP for improved strength, balance, transfers and gait. Baseline: Goal status: INITIAL   LONG TERM GOALS: Target date: 10/01/2023  Pt will be Supervision with final aquatic HEP for improved strength, balance, transfers and gait. Baseline:  Goal status: INITIAL   ASSESSMENT:  CLINICAL IMPRESSION: Patient is a *** year old *** referred to Neuro OPPT for***.   Pt's PMH is significant for: *** The following deficits were present during the exam: ***. Based on ***, pt is an incr risk for falls. Pt would benefit from skilled PT to address these impairments and functional limitations to maximize functional mobility independence.   OBJECTIVE IMPAIRMENTS: {opptimpairments:25111}.   ACTIVITY LIMITATIONS: {activitylimitations:27494}  PARTICIPATION LIMITATIONS: {participationrestrictions:25113}  PERSONAL  FACTORS: {Personal factors:25162} are also affecting patient's functional outcome.   REHAB POTENTIAL: {rehabpotential:25112}  CLINICAL DECISION MAKING: {clinical decision making:25114}  EVALUATION COMPLEXITY: Moderate  PLAN:  PT FREQUENCY: 1x/week  PT DURATION:  8 sessions (POC set for 12 weeks to allow for scheduling delays)  PLANNED INTERVENTIONS: {rehab planned interventions:25118::"97110-Therapeutic exercises","97530- Therapeutic [phone]- Neuromuscular re-education","97535- Self JXBJ","47829- Manual therapy"}  PLAN FOR NEXT SESSION: ***   Peter Congo, PT Peter Congo, PT, DPT, CSRS  07/09/2023, 2:20 PM

## 2023-07-28 ENCOUNTER — Telehealth: Payer: Self-pay | Admitting: Physical Medicine & Rehabilitation

## 2023-07-28 NOTE — Telephone Encounter (Signed)
 Patient is asking for clinical to call her. She had an episode that lasted 5 minutes that her head felt weird and seen wavy lines. She is asking for a call.

## 2023-07-29 ENCOUNTER — Encounter: Payer: Self-pay | Admitting: Physical Medicine & Rehabilitation

## 2023-07-29 ENCOUNTER — Encounter: Attending: Physical Medicine & Rehabilitation | Admitting: Physical Medicine & Rehabilitation

## 2023-07-29 DIAGNOSIS — H538 Other visual disturbances: Secondary | ICD-10-CM | POA: Diagnosis not present

## 2023-07-29 DIAGNOSIS — G890 Central pain syndrome: Secondary | ICD-10-CM | POA: Insufficient documentation

## 2023-07-29 MED ORDER — GABAPENTIN 600 MG PO TABS: 600.0000 mg | ORAL_TABLET | Freq: Three times a day (TID) | ORAL | 1 refills | Status: DC

## 2023-07-29 NOTE — Progress Notes (Addendum)
 Subjective:  68 y.o. right-handed female with history of hypertension, recurrent UTIs maintained on prophylactic Macrodantin  aortic insufficiency, PAF with history of ablation maintained on Eliquis.  Her latest cardiology notes that she was planning Xarelto to see if this may be more affordable for her however it was never started and she was maintained on Eliquis.  Per chart review patient lives alone.  Daughter in the area.  Presented 09/25/2021 with acute onset of left-sided weakness as well as blurred vision.  CT of the head showed hemorrhage centered in the right thalamus and basal ganglia with mild surrounding edema, minimal midline shift and a small amount of intraventricular extension into the third and fourth ventricle.  Patient was reversed with Andexxa .  Follow-up MRI/MRA unchanged size of morphology of acute intraparenchymal hemorrhage.  No hydrocephalus or trapping.  There was a noted focal severe distal left P2 stenosis on MRA.  Admission chemistries unremarkable except potassium 3.2 urinalysis negative.  Echocardiogram with ejection fraction of 65 to 70% no wall motion abnormalities.  Patient did require short-term intubation through 09/27/2021.  Maintained on a mechanical soft nectar thick liquid diet and advance to thin liquids 10/01/2021.   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  Repeat CT of the head 10/11/2021 showed fading blood products within the right thalamus.  Regional hypodensity persist in my reflect a combination of residual edema and developing myelomalacia.  Mild regional mass effect diminished since 10/02/2021.  No new intracranial abnormality.  Patient noted history of PAF at this time would remain off of Eliquis and follow-up with cardiology services Dr.Komado of [facility].  Cardiac rate remained controlled.  Pain management discomfort left lower extremity neuropathic due to the  infarction maintained on gabapentin  and titrated as needed patient also maintained on Zanaflex  2 mg nightly with the addition of Lidoderm  patch and Cymbalta .  Admit date: 10/01/2021 Discharge date: 11/06/2021  Patient ID: Charlotte Hunter, female    DOB: 1955-07-18, 68 y.o.   MRN: [id number] Phone visit , pt at home and physician at office .  Can firmed the patient's identity.  Discussed the limitations of the phone visit. 12 minute visit HPI Woke up a couple days ago with visual disturbance accompanied by feeling of impending doom.   Denies any shaking, the patient did not have any falls, she had no recent change of medications.  Because she has very limited use of her left arm she has difficulty taking her blood pressure. She does live alone. Feels back to normal  Pain Inventory Average Pain 10 Pain Right Now 8 My pain is constant, sharp, burning, and stabbing  In the last 24 hours, has pain interfered with the following? General activity 8 Relation with others 4 Enjoyment of life 5 What TIME of day is your pain at its worst? evening around 3 pm Sleep (in general) Good  Pain is worse with: walking, sitting, and standing Pain improves with: rest Relief from Meds: 9  No family history on file. Social History   Socioeconomic History   Marital status: Single    Spouse name: Not on file   Number of children: 1   Years of education: Not on file   Highest education level: Not on file  Occupational History   Not on file  Tobacco Use   Smoking status: Never   Smokeless tobacco: Never  Vaping Use   Vaping status: Never Used  Substance and Sexual Activity  Alcohol  use: Not Currently    Alcohol /week: 1.0 standard drink of alcohol     Types: 1 Glasses of wine per week   Drug use: Never   Sexual activity: Not Currently  Other Topics Concern   Not on file  Social History Narrative   12/27/21 dgtr lives with her   Social Drivers of Health   Financial Resource Strain: Low Risk   (03/29/2020)   Received from [facility], [facility]   Overall Financial Resource Strain (CARDIA)    Difficulty of Paying Living Expenses: Not very hard  Food Insecurity: No Food Insecurity (07/24/2020)   Received from [facility], [facility]   Hunger Vital Sign    Worried About Running Out of Food in the Last Year: Never true    Ran Out of Food in the Last Year: Never true  Transportation Needs: No Transportation Needs (03/29/2020)   Received from [facility], [facility]   PRAPARE - Transportation    Lack of Transportation (Medical): No    Lack of Transportation (Non-Medical): No  Physical Activity: Insufficiently Active (03/29/2020)   Received from [facility], [facility]   Exercise Vital Sign    Days of Exercise per Week: 2 days    Minutes of Exercise per Session: 30 min  Stress: No Stress Concern Present (03/29/2020)   Received from [facility] - Occupational Stress Questionnaire    Feeling of Stress : Only a little  Social Connections: Unknown (08/28/2021)   Received from [facility], [facility]   Social Network    Social Network: Not on file   Past Surgical History:  Procedure Laterality Date   ABDOMINAL HYSTERECTOMY     ABLATION  05/03/2021   heart   c section  01/1968   KNEE SURGERY Right    meniscus   SHOULDER SURGERY Right 08/2014   Past Surgical History:  Procedure Laterality Date   ABDOMINAL HYSTERECTOMY     ABLATION  05/03/2021   heart   c section  01/1968   KNEE SURGERY Right    meniscus   SHOULDER SURGERY Right 08/2014   Past Medical History:  Diagnosis Date   A-fib [facility])    Aortic insufficiency    Hypertension    Stroke ([name])    There were no vitals taken for this visit.  Opioid Risk Score:   Fall Risk Score:  `1  Depression screen [facility] 2/9     07/11/2022    1:06 PM 04/11/2022   12:08 PM 02/19/2022    2:01 PM 12/04/2021    1:22 PM  Depression screen PHQ  2/9  Decreased Interest 0 0 0 0  Down, Depressed, Hopeless 0 0 0 0  PHQ - 2 Score 0 0 0 0  Altered sleeping    0  Tired, decreased energy    0  Change in appetite    0  Feeling bad or failure about yourself     0  Trouble concentrating    0  Moving slowly or fidgety/restless    0  Suicidal thoughts    0  PHQ-9 Score    0     Review of Systems  Musculoskeletal:        Spasticity is worse lately  All other systems reviewed and are negative.     Objective:   Physical Exam Exam limited by phone visit Speech without evidence of dysarthria or aphasia. Mood and affect appear appropriate  Assessment & Plan:  1.  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 2.  Patient had an ill-defined episode that lasted a few minutes a couple days ago.  She states she did not feel good for about a day but then woke up the next morning and felt fine.  We discussed that if she has another episode like this she should call 911.  It did not sound like she had any signs of seizure although that could be in the differential.  We also discussed that she needs to continue to monitor blood pressures and follow-up with her cardiologist. I have an appointment to see her in approximately 3 to 4 months but she can certainly make this appointment sooner. Her neurologist is Dr. Salli Crawley

## 2023-08-13 ENCOUNTER — Encounter: Payer: Self-pay | Admitting: Physical Therapy

## 2023-08-13 ENCOUNTER — Ambulatory Visit: Attending: Physical Medicine & Rehabilitation | Admitting: Physical Therapy

## 2023-08-13 DIAGNOSIS — R2689 Other abnormalities of gait and mobility: Secondary | ICD-10-CM | POA: Diagnosis present

## 2023-08-13 DIAGNOSIS — M6281 Muscle weakness (generalized): Secondary | ICD-10-CM | POA: Diagnosis present

## 2023-08-13 DIAGNOSIS — I69154 Hemiplegia and 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 left non-dominant side: Secondary | ICD-10-CM | POA: Diagnosis present

## 2023-08-13 NOTE — Therapy (Signed)
 OUTPATIENT PHYSICAL THERAPY NEURO/AQUATIC THERAPY TREATMENT NOTE   Patient Name: Charlotte Hunter MRN: [id number] DOB:Dec 31, 1955, 68 y.o., female Today's Date: 08/13/2023   PCP: [name], Jean M, MD REFERRING PROVIDER: Genetta Kenning, MD  END OF SESSION:  PT End of Session - 08/13/23 1832     Visit Number 2    Number of Visits 9   with eval   Date for PT Re-Evaluation 10/01/23   to allow for scheduling delays   Authorization Type Medicare    PT Start Time 1305    PT Stop Time 1402    PT Time Calculation (min) 57 min    Equipment Utilized During Treatment Other (comment)   aquatic cuff   Activity Tolerance Patient tolerated treatment well    Behavior During Therapy [facility] for tasks assessed/performed             Past Medical History:  Diagnosis Date   A-fib [facility])    Aortic insufficiency    Hypertension    Stroke [facility])    Past Surgical History:  Procedure Laterality Date   ABDOMINAL HYSTERECTOMY     ABLATION  05/03/2021   heart   c section  01/1968   KNEE SURGERY Right    meniscus   SHOULDER SURGERY Right 08/2014   Patient Active Problem List   Diagnosis Date Noted   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   ICH (intracerebral hemorrhage) ([name]) 10/01/2021   Paroxysmal atrial fibrillation ([name]) 09/29/2021   Stenosis of intracranial vessel 09/29/2021   Essential hypertension 09/29/2021   Blurry vision 09/29/2021   GERD (gastroesophageal reflux disease) 09/29/2021   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 09/25/2021    ONSET DATE: 05/29/2023  REFERRING DIAG: G89.0 (ICD-10-CM) - Thalamic pain syndrome I63.9,G81.14 (ICD-10-CM) - 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 ([name])  THERAPY DIAG:  Hemiplegia and 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 left non-dominant side ([name])  Muscle weakness (generalized)  Other abnormalities of gait and mobility  Rationale for Evaluation and  Treatment: Rehabilitation  SUBJECTIVE:                                                                                                                                                                                             SUBJECTIVE STATEMENT: Pt presents for initial aquatic PT session in this POC; using manual wheelchair propelled by her friend, [name] to access pool area  Pt reports she had significant increase in pain in past 3-4 weeks: reports she was doing really well - was walking around in her kitchen not holding onto cane but then pain started about 3 weeks  ago and she feels she has been regressing; rates the pain 10/10 today - states she took 1000mg  Gabapentin  this morning due to the pain - doesn't usually take this much at one time  Per initial eval on 3-26 -25 : "Pt reports ongoing thalamic pain syndrome with intermittent numbness in the L side of her face and down her LLE."    Pt accompanied by: friend, [name] - propelling wheelchair for patient  PERTINENT HISTORY: PMH: A-fib on Eliquis, aortic insufficiency, hypertension, fibromyalgia, 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 Yes: NPRS scale: 10/10 in Lt side of body Pain location: lateral portion of L side of body Pain description: stabbing in heel, then changes to extreme numbness, can feel hot or freezing cold; numbness so bad you want to chop it off; feels rigid in trunk by end of the day, arm and leg get heavy Aggravating factors: nothing in particular, gets worse as the day goes on Relieving factors: gabapentin  along with a muscle relaxer, lidocaine  gel; laying down   PRECAUTIONS: Fall  RED FLAGS: None   WEIGHT BEARING RESTRICTIONS: No  FALLS: Has patient fallen in last 6 months? No  LIVING ENVIRONMENT: Lives with: lives alone Lives in: House/apartment Stairs: No Has following equipment at home: Chiropractor, Environmental consultant - 2 wheeled,  Wheelchair (manual), Tour manager, and Ramped entry  PLOF: Independent with basic ADLs, Independent with gait, Independent with transfers, and Requires assistive device for independence  PATIENT GOALS: "to work on L hip rotation, pool would give me less gravity to work with"  OBJECTIVE:  Note: Objective measures were completed at Evaluation unless otherwise noted.  DIAGNOSTIC FINDINGS: None updated and relevant to this POC  COGNITION: Overall cognitive status: Within functional limits for tasks assessed   SENSATION: Impaired in L hemibody  EDEMA:  Mild in LLE  POSTURE: rounded shoulders and forward head  LOWER EXTREMITY ROM:     Active  Right Eval Left Eval  Hip flexion    Hip extension    Hip abduction    Hip adduction    Hip internal rotation    Hip external rotation    Knee flexion    Knee extension    Ankle dorsiflexion  decreased  Ankle plantarflexion    Ankle inversion    Ankle eversion     (Blank rows = not tested)  LOWER EXTREMITY MMT:    MMT Right Eval Left Eval  Hip flexion 5 3+  Hip extension    Hip abduction    Hip adduction    Hip internal rotation    Hip external rotation    Knee flexion 5 2-  Knee extension 5 4  Ankle dorsiflexion 4 2  Ankle plantarflexion    Ankle inversion    Ankle eversion    (Blank rows = not tested)  BED MOBILITY:  Mod I per patient report, utilizes bed rail  TRANSFERS: Assistive device utilized: Chiropractor  Sit to stand: Modified independence Stand to sit: Modified independence Chair to chair: Modified independence Floor:  not assessed at eval  GAIT: Gait pattern:  L hip IR, decreased step length- Left, decreased hip/knee flexion- Left, and decreased ankle dorsiflexion- Left Distance walked: various clinic distances Assistive device utilized: Quad cane large base Level of assistance: Modified independence Comments: patient ambulates in/out of appointment with her [facility], does need increased  time  FUNCTIONAL TESTS:  TREATMENT: 08-13-23   Aquatic therapy at [facility] - pool temp 90 degrees  Patient seen for aquatic therapy today.  Treatment took place in water 3.5-4.5 feet deep depending on activity.  Pt entered and exited the pool via step negotiation with use of bil. Hand rails with CGA with descension for entering pool and CGA to min assist for ascending steps for exiting pool -  using a step by step sequence  Pt performed water walking 18' x 2 reps with pt holding onto PT's forearm per her request;  pt performed sideways amb. 18' x 2 reps and backwards 18' x 1 rep   Seated on bench in 3'6" pool area - performed AAROM Lt knee flexion/extension approx. 10 reps initially, then pt performed active flexion/extension without assistance - PT holding Lt thigh down due to increased buoyancy with flotation; passive Lt hip internal/external rotation approx. 10 reps with stretch at end range of external rotation  Rt hip and knee flexion (1/2 march) standing in place 10 reps for LLE unilateral weight bearing/SLS  Squats 10 reps bil. LE's  Pt performed Lt hip flexion, extension, abduction with use of aquatic cuff -  10reps each direction - in standing:   Pt performed forwards ambulation in 3.8" pool depth - 18' x 4 reps - holding lightly onto PT's forearm per her request - pt declined holding large single barbell  Supine exercises at end of session for pain reduction and joint offloading - supported pt in supine - pt performed bicycling LE's approx. 10 reps 3 sets: hip abdct./adduction 10 reps; gentle swaying by PT for relaxation and pain management  Pt requires buoyancy of water for support for joint unloading and for reduced fall risk with gait training without device than can be safely performed on land. Water current produces perturbations for challenge for  static & dynamic standing balance.  Buoyancy of water provides unweighting which assists with pain reduction.        PATIENT EDUCATION: Education details: Eval findings, PT POC Person educated: Patient Education method: Explanation Education comprehension: verbalized understanding and needs further education  HOME EXERCISE PROGRAM: To be reviewed/initiated  GOALS: Goals reviewed with patient? Yes  SHORT TERM GOALS: Target date: 08/20/2023  Pt will be Supervision with initial aquatic HEP for improved strength, balance, transfers and gait. Baseline: Goal status: INITIAL   LONG TERM GOALS: Target date: 10/01/2023  Pt will be Supervision with final aquatic HEP for improved strength, balance, transfers and gait. Baseline:  Goal status: INITIAL   ASSESSMENT:  CLINICAL IMPRESSION: Aquatic PT session focused on water walking and LLE weight bearing/SLS activities and supine position with supported floatation for pain reduction.  Pt reported increased pain in Lt foot with backwards ambulation with less pain with forwards ambulation.  Pt rated pain 10/10 at start of session and reported moderate pain during session at 9/10 intensity (no significant reduction achieved with aquatic exercises).  Pt did report relief in pain over gluteal area with supine exercises.  Cont with POC.    OBJECTIVE IMPAIRMENTS: Abnormal gait, decreased balance, decreased endurance, decreased mobility, difficulty walking, decreased ROM, decreased strength, increased edema, impaired perceived functional ability, impaired sensation, impaired tone, impaired UE functional use, and pain.   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   REHAB POTENTIAL: Good  CLINICAL DECISION MAKING: Stable/uncomplicated  EVALUATION COMPLEXITY: Moderate  PLAN:  PT FREQUENCY: 1x/week  PT DURATION:  8 sessions (POC set for 12 weeks to allow for scheduling delays)  PLANNED INTERVENTIONS: 97164- PT Re-evaluation, 97110-Therapeutic exercises, 97530- Therapeutic activity, 97112- Neuromuscular re-education, 97535- Self Care, 16109- Manual therapy, [phone]- Gait training, [phone]- Orthotic Fit/training, [phone]- Aquatic Therapy, [phone]- Electrical stimulation (manual), Patient/Family education, Balance training, Stair training, Taping, Dry Needling, Joint mobilization, Cognitive remediation, DME instructions, Wheelchair mobility training, Cryotherapy, and Moist heat  PLAN FOR NEXT SESSION: aquatic exercise - LLE strengthening, water walking   Jeanni Allshouse, Celeste Cola, PT   08/13/2023, 7:06 PM

## 2023-09-03 ENCOUNTER — Ambulatory Visit: Attending: Physical Medicine & Rehabilitation | Admitting: Physical Therapy

## 2023-09-03 ENCOUNTER — Encounter: Payer: Self-pay | Admitting: Physical Therapy

## 2023-09-03 DIAGNOSIS — I69154 Hemiplegia and 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 left non-dominant side: Secondary | ICD-10-CM | POA: Diagnosis present

## 2023-09-03 DIAGNOSIS — R29818 Other symptoms and signs involving the nervous system: Secondary | ICD-10-CM | POA: Diagnosis present

## 2023-09-03 DIAGNOSIS — R2681 Unsteadiness on feet: Secondary | ICD-10-CM | POA: Diagnosis present

## 2023-09-03 DIAGNOSIS — R2689 Other abnormalities of gait and mobility: Secondary | ICD-10-CM | POA: Diagnosis present

## 2023-09-03 NOTE — Therapy (Signed)
 OUTPATIENT PHYSICAL THERAPY NEURO/AQUATIC THERAPY TREATMENT NOTE   Patient Name: Charlotte Hunter MRN: [id number] DOB:04-01-1956, 68 y.o., female Today's Date: 09/03/2023   PCP: Wynonia Hedges, MD REFERRING PROVIDER: Genetta Kenning, MD  END OF SESSION:  PT End of Session - 09/03/23 1728     Visit Number 3    Number of Visits 9   with eval   Date for PT Re-Evaluation 10/01/23   to allow for scheduling delays   Authorization Type Medicare    PT Start Time 1303    PT Stop Time 1402    PT Time Calculation (min) 59 min    Equipment Utilized During Treatment Other (comment);Gait belt   aquatic cuff   Activity Tolerance Patient tolerated treatment well    Behavior During Therapy WFL for tasks assessed/performed             Past Medical History:  Diagnosis Date   A-fib [facility])    Aortic insufficiency    Hypertension    Stroke [facility])    Past Surgical History:  Procedure Laterality Date   ABDOMINAL HYSTERECTOMY     ABLATION  05/03/2021   heart   c section  01/1968   KNEE SURGERY Right    meniscus   SHOULDER SURGERY Right 08/2014   Patient Active Problem List   Diagnosis Date Noted   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   ICH (intracerebral hemorrhage) ([name]) 10/01/2021   Paroxysmal atrial fibrillation ([name]) 09/29/2021   Stenosis of intracranial vessel 09/29/2021   Essential hypertension 09/29/2021   Blurry vision 09/29/2021   GERD (gastroesophageal reflux disease) 09/29/2021   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 09/25/2021    ONSET DATE: 05/29/2023  REFERRING DIAG: G89.0 (ICD-10-CM) - Thalamic pain syndrome I63.9,G81.14 (ICD-10-CM) - 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 ([name])  THERAPY DIAG:  Hemiplegia and 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 left non-dominant side ([name])  Other abnormalities of gait and mobility  Unsteadiness on feet  Other symptoms and signs  involving the nervous system  Rationale for Evaluation and Treatment: Rehabilitation  SUBJECTIVE:                                                                                                                                                                                             SUBJECTIVE STATEMENT: Pt enters pool area in manual wheelchair, propelled by her friend, [name]; pt reports she felt really good after previous pool session 3 weeks ago- says she had much less tightness and was able to move much easier.  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 Yes: NPRS scale: 6/10 in Lt side of body Pain location: lateral portion of L side of body Pain description: stabbing in heel, then changes to extreme numbness, can feel hot or freezing cold; numbness so bad you want to chop it off; feels rigid in trunk by end of the day, arm and leg get heavy Aggravating factors: nothing in particular, gets worse as the day goes on Relieving factors: gabapentin  along with a muscle relaxer, lidocaine  gel; laying down   PRECAUTIONS: Fall  RED FLAGS: None   WEIGHT BEARING RESTRICTIONS: No  FALLS: Has patient fallen in last 6 months? No  LIVING ENVIRONMENT: Lives with: lives alone Lives in: House/apartment Stairs: No Has following equipment at home: Chiropractor, Environmental consultant - 2 wheeled, Wheelchair (manual), Tour manager, and Ramped entry  PLOF: Independent with basic ADLs, Independent with  gait, Independent with transfers, and Requires assistive device for independence  PATIENT GOALS: "to work on L hip rotation, pool would give me less gravity to work with"  OBJECTIVE:  Note: Objective measures were completed at Evaluation unless otherwise noted.  DIAGNOSTIC FINDINGS: None updated and relevant to this POC  COGNITION: Overall cognitive status: Within functional limits for tasks assessed   SENSATION: Impaired in L hemibody  EDEMA:  Mild in LLE  POSTURE: rounded shoulders and forward head  LOWER EXTREMITY ROM:     Active  Right Eval Left Eval  Hip flexion    Hip extension    Hip abduction    Hip adduction    Hip internal rotation    Hip external rotation    Knee flexion    Knee extension    Ankle dorsiflexion  decreased  Ankle plantarflexion    Ankle inversion    Ankle eversion     (Blank rows = not tested)  LOWER EXTREMITY MMT:    MMT Right Eval Left Eval  Hip flexion 5 3+  Hip extension    Hip abduction    Hip adduction    Hip internal rotation    Hip external rotation    Knee flexion 5 2-  Knee extension 5 4  Ankle dorsiflexion 4 2  Ankle plantarflexion    Ankle inversion    Ankle eversion    (Blank rows = not tested)  BED MOBILITY:  Mod I per patient report, utilizes bed rail  TRANSFERS: Assistive device utilized: Chiropractor  Sit to stand: Modified independence Stand to sit: Modified independence Chair to chair: Modified independence Floor: not assessed at eval  GAIT: Gait pattern: L hip IR, decreased step length- Left, decreased hip/knee flexion- Left, and decreased ankle dorsiflexion- Left Distance walked: various clinic distances Assistive device utilized: Quad cane large base Level of assistance: Modified independence Comments: patient ambulates in/out of appointment with her [facility], does need increased time  FUNCTIONAL TESTS:  TREATMENT: 09-03-23   Aquatic therapy at [facility] - pool temp 91 degrees  Patient seen for aquatic therapy today.  Treatment took place in water 3.5-4.5 feet deep depending on activity.  Pt entered and exited the pool via step negotiation with use of bil. Hand rails with CGA with descension for entering pool and CGA to min assist for ascending steps for exiting pool -  using a step by step sequence; gait belt was used with step descension for safety  Pt performed water walking 18' x 4 reps with SBA - no UE support used:  pt performed sideways amb. 18' x 2 reps and backwards 18' x 2 reps with UE support on PT's forearms   Modified push up on side of pool for weight bearing through LUE for tone reduction - 10 reps performed; initial 3-4 reps performed with bil. UE support, then final 6-7 reps performed with LUE only  Seated on bench in 3'6" pool area - pt performed active flexion/extension without assistance - PT holding Lt thigh down due to increased bouyancy: Lt hamstring stretching in long sitting position on bench - 15 sec hold x 1 rep;  small flexion/extension with LLE extended for increased quad activation and hamstring stretching  Rt hip and knee flexion (1/2 march) standing in place 10 reps for LLE unilateral weight bearing/SLS  Squats 10 reps bil. LE's with RUE support on pool edge:  LLE unilateral squats with bil. UE support on PT's forearms 10 reps  Pt performed Lt hip flexion, extension, abduction with use of aquatic cuff -  15 reps for flexion;  12 reps for Lt hip abduction:  approx. 10 reps for Lt hip extension with CGA to reduce abduction with flexion - in standing position; Lt hip adduction/abduction with aquatic cuff 10 reps  Tap ups with RLE to bottom step in pool 10 reps:  tap ups to 2nd step from bottom with RLE 10 reps for improved LLE SLS:  pt performed step ups to 1st step (bottom step) with LLE with RUE support with min assist for  balance with full knee extension LLE  Runner's stretch LLE 20 sec x 1 rep for Lt hamstring and heel cord stretch  Supine exercises at end of session for pain reduction and joint offloading - supported pt in supine - pt performed bicycling LE's approx. 10 reps 3 sets: hip abdct./adduction 10 reps; gentle swaying by PT for relaxation and pain management  Pt requires buoyancy of water for support for joint unloading and for reduced fall risk with gait training without device than can be safely performed on land. Water current produces perturbations for challenge for static & dynamic standing balance.  Buoyancy of water provides unweighting which assists with pain reduction.        PATIENT EDUCATION: Education details: Eval findings, PT POC Person educated: Patient Education method: Explanation Education comprehension: verbalized understanding and needs further education  HOME EXERCISE PROGRAM: To be reviewed/initiated  GOALS: Goals reviewed with patient? Yes  SHORT TERM GOALS: Target date: 08/20/2023  Pt will be Supervision with initial aquatic HEP for improved strength, balance, transfers and gait. Baseline: Goal status: In progress - pt has attended 1 aquatic PT sessions since initial eval on 07-09-23   LONG TERM GOALS: Target date: 10/01/2023  Pt will be Supervision with final aquatic HEP for improved strength, balance, transfers and gait. Baseline:  Goal status: INITIAL   ASSESSMENT:  CLINICAL IMPRESSION: Aquatic PT session focused on water walking and LLE weight bearing/SLS activities  and supine exercises for joint offloading for reduced pain in Lt foot.  Pt reported onset of pain in Lt foot during final 1/3 of session; pt attributed pain to performing Lt hip extension/flexion in standing and stated she thought her foot may have dragged on pool floor with flexion, however, this was not noted during any rep of the 10 reps performed of this exercise. Pt wearing custom AFO on LLE  with pool shoes.  Pt tolerated aquatic exercise well until c/o Lt foot pain and performed step up exercise with LLE onto bottom step for 1st time in today's session.  Pain may have been due to increased LLE weight bearing exercises initiated in today's session.  Cont with POC.    OBJECTIVE IMPAIRMENTS: Abnormal gait, decreased balance, decreased endurance, decreased mobility, difficulty walking, decreased ROM, decreased strength, increased edema, impaired perceived functional ability, impaired sensation, impaired tone, impaired UE functional use, and pain.   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   REHAB POTENTIAL: Good  CLINICAL DECISION MAKING: Stable/uncomplicated  EVALUATION COMPLEXITY: Moderate  PLAN:  PT FREQUENCY: 1x/week  PT DURATION: 8 sessions (POC set for 12 weeks to allow for scheduling delays)  PLANNED INTERVENTIONS: 16109- PT Re-evaluation, 97110-Therapeutic exercises, 97530- Therapeutic activity, 97112- Neuromuscular re-education, 97535- Self Care, 60454- Manual therapy, [phone]- Gait training, [phone]- Orthotic Fit/training, [phone]- Aquatic Therapy, [phone]- Electrical stimulation (manual), Patient/Family education, Balance training, Stair training, Taping, Dry Needling, Joint mobilization, Cognitive remediation, DME instructions, Wheelchair mobility training, Cryotherapy, and Moist heat  PLAN FOR NEXT SESSION: aquatic exercise - LLE strengthening, water walking; did Lt foot pain subside quickly after  session?   Dorsie Gaunt, PT   09/03/2023, 5:30 PM

## 2023-09-17 ENCOUNTER — Ambulatory Visit: Attending: Physical Medicine & Rehabilitation | Admitting: Physical Therapy

## 2023-09-17 DIAGNOSIS — R2689 Other abnormalities of gait and mobility: Secondary | ICD-10-CM | POA: Diagnosis present

## 2023-09-17 DIAGNOSIS — R29818 Other symptoms and signs involving the nervous system: Secondary | ICD-10-CM | POA: Insufficient documentation

## 2023-09-17 DIAGNOSIS — R2681 Unsteadiness on feet: Secondary | ICD-10-CM | POA: Insufficient documentation

## 2023-09-17 DIAGNOSIS — I69154 Hemiplegia and 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 left non-dominant side: Secondary | ICD-10-CM | POA: Diagnosis present

## 2023-09-19 ENCOUNTER — Encounter: Payer: Self-pay | Admitting: Physical Therapy

## 2023-09-19 NOTE — Therapy (Signed)
 OUTPATIENT PHYSICAL THERAPY NEURO/AQUATIC THERAPY TREATMENT NOTE   Patient Name: Charlotte Hunter MRN: [id number] DOB:January 14, 1956, 68 y.o., female Today's Date: 09/19/2023   PCP: [name], Jean M, MD REFERRING PROVIDER: Genetta Kenning, MD  END OF SESSION:  PT End of Session - 09/19/23 1646     Number of Visits 9   with eval   Date for PT Re-Evaluation 10/01/23   to allow for scheduling delays   Authorization Type Medicare    Equipment Utilized During Treatment Other (comment);Gait belt    Activity Tolerance Patient tolerated treatment well    Behavior During Therapy [facility] for tasks assessed/performed             Past Medical History:  Diagnosis Date   A-fib [facility])    Aortic insufficiency    Hypertension    Stroke [facility])    Past Surgical History:  Procedure Laterality Date   ABDOMINAL HYSTERECTOMY     ABLATION  05/03/2021   heart   c section  01/1968   KNEE SURGERY Right    meniscus   SHOULDER SURGERY Right 08/2014   Patient Active Problem List   Diagnosis Date Noted   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   ICH (intracerebral hemorrhage) ([name]) 10/01/2021   Paroxysmal atrial fibrillation ([name]) 09/29/2021   Stenosis of intracranial vessel 09/29/2021   Essential hypertension 09/29/2021   Blurry vision 09/29/2021   GERD (gastroesophageal reflux disease) 09/29/2021   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 09/25/2021    ONSET DATE: 05/29/2023  REFERRING DIAG: G89.0 (ICD-10-CM) - Thalamic pain syndrome I63.9,G81.14 (ICD-10-CM) - 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 ([name])  THERAPY DIAG:  Hemiplegia and 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 left non-dominant side ([name])  Other abnormalities of gait and mobility  Unsteadiness on feet  Other symptoms and signs involving the nervous system  Rationale for Evaluation and Treatment: Rehabilitation  SUBJECTIVE:                                                                                                                                                                                              SUBJECTIVE STATEMENT: Pt reports she has had increased pain on Lt side of body since 2 days after previous aquatic PT session (on 09-10-23) - reason unknown - feels she has had a decline due to this increased pain; recommended to pt that she contact Dr. 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 Yes: NPRS scale: 8/10 in Lt side of body Pain location: lateral portion of L side of body Pain description: stabbing in heel, then changes to extreme numbness, can feel hot or freezing cold; numbness so bad you want to chop it off; feels rigid in trunk by end of the day, arm and leg get heavy Aggravating factors: nothing in particular, gets worse as the day goes on Relieving factors: gabapentin  along with a muscle relaxer, lidocaine  gel; laying down   PRECAUTIONS: Fall  RED FLAGS: None   WEIGHT BEARING RESTRICTIONS: No  FALLS: Has patient fallen in last 6 months? No  LIVING ENVIRONMENT: Lives with: lives alone Lives in: House/apartment Stairs: No Has following equipment at home: Chiropractor, Environmental consultant - 2 wheeled, Wheelchair (manual), Tour manager, and Ramped entry  PLOF: Independent with basic ADLs, Independent with gait, Independent with transfers, and Requires assistive device for independence  PATIENT GOALS: "to work on L hip rotation, pool would give me less gravity to work with"  OBJECTIVE:   Note: Objective measures were completed at Evaluation unless otherwise noted.  DIAGNOSTIC FINDINGS: None updated and relevant to this POC  COGNITION: Overall cognitive status: Within functional limits for tasks assessed   SENSATION: Impaired in L hemibody  EDEMA:  Mild in LLE  POSTURE: rounded shoulders and forward head  LOWER EXTREMITY ROM:     Active  Right Eval Left Eval  Hip flexion    Hip extension    Hip abduction    Hip adduction    Hip internal rotation    Hip external rotation    Knee flexion    Knee extension    Ankle dorsiflexion  decreased  Ankle plantarflexion    Ankle inversion    Ankle eversion     (Blank rows = not tested)  LOWER EXTREMITY MMT:    MMT Right Eval Left Eval  Hip flexion 5 3+  Hip extension    Hip abduction    Hip adduction    Hip internal rotation    Hip external rotation    Knee flexion 5 2-  Knee extension 5 4  Ankle dorsiflexion 4 2  Ankle plantarflexion    Ankle inversion    Ankle eversion    (Blank rows = not tested)  BED MOBILITY:  Mod I per patient report, utilizes bed rail  TRANSFERS: Assistive device utilized: Chiropractor  Sit to stand: Modified independence Stand to sit: Modified independence Chair to chair: Modified independence Floor: not assessed at eval  GAIT: Gait pattern: L hip IR, decreased step length- Left, decreased hip/knee flexion- Left, and decreased ankle dorsiflexion- Left Distance walked: various clinic distances Assistive device utilized: Quad cane large base Level of assistance: Modified independence Comments: patient ambulates in/out of appointment with her [facility], does need increased time  FUNCTIONAL TESTS:  TREATMENT: 09-17-23   Aquatic therapy at [facility] - pool temp 90 degrees  Patient seen for aquatic therapy today.  Treatment took place in  water 3.5-4.5 feet deep depending on activity.  Pt entered and exited the pool via step negotiation with use of bil. Hand rails with CGA with descension for entering pool and CGA to min assist for ascending steps for exiting pool -  using a step by step sequence; gait belt was used with step descension for safety  Pt performed water walking 18' x 4 reps with SBA - no UE support used:  pt performed sideways amb. 18' x 2 reps and backwards 18' x 1 rep with UE support on PT's forearms   Seated on bench in 3'6" pool area - pt performed active flexion/extension without assistance - PT holding Lt thigh down due to increased bouyancy: Lt hamstring stretching in long sitting position on bench - 15 sec hold x 1 rep;  small flexion/extension with LLE extended for increased quad activation and hamstring stretching  Rt hip and knee flexion (1/2 march) standing in place attempted - too painful for Lt foot    Pt performed Lt hip flexion, extension, abduction - no increased resistance used-  10 reps for flexion;  10 reps for Lt hip abduction:  approx. 5 reps for Lt hip extension - in     standing position  Runner's stretch LLE 20 sec x 1 rep for Lt hamstring and heel cord stretch  Supine exercises at end of session for pain reduction and joint offloading - supported pt in supine - relaxation only for pain reduction;  gentle swaying by PT attempted - pt reported increased pain in Lt side of trunk/chest area so this movement was discontinued  Pt requires buoyancy of water for support for joint unloading and for reduced fall risk with gait training without device than can be safely performed on land. Water current produces perturbations for challenge for static & dynamic standing balance.  Buoyancy of water provides unweighting which assists with pain reduction.        PATIENT EDUCATION: Education details: Eval findings, PT POC Person educated: Patient Education method: Explanation Education comprehension:  verbalized understanding and needs further education  HOME EXERCISE PROGRAM: To be reviewed/initiated  GOALS: Goals reviewed with patient? Yes  SHORT TERM GOALS: Target date: 08/20/2023  Pt will be Supervision with initial aquatic HEP for improved strength, balance, transfers and gait. Baseline: Goal status: In progress - pt has attended 1 aquatic PT sessions since initial eval on 07-09-23   LONG TERM GOALS: Target date: 10/01/2023  Pt will be Supervision with final aquatic HEP for improved strength, balance, transfers and gait. Baseline:  Goal status: INITIAL   ASSESSMENT:  CLINICAL IMPRESSION: Aquatic PT session focused on water walking, LLE AROM with use of viscosity of water only for strengthening and floatation for pain reduction. Pt's participation in today's session very limited by c/o increased pain in Lt foot; pt reports she has had increased pain on Lt side of body since 2 days after previous aquatic PT session (09-03-23).  Cont with POC.    OBJECTIVE IMPAIRMENTS: Abnormal gait, decreased balance, decreased endurance, decreased mobility, difficulty walking, decreased ROM, decreased strength, increased edema, impaired perceived functional ability, impaired sensation, impaired tone, impaired UE functional use, and pain.   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   REHAB POTENTIAL: Good  CLINICAL DECISION MAKING: Stable/uncomplicated  EVALUATION COMPLEXITY: Moderate  PLAN:  PT FREQUENCY:  1x/week  PT DURATION: 8 sessions (POC set for 12 weeks to allow for scheduling delays)  PLANNED INTERVENTIONS: 97164- PT Re-evaluation, 97110-Therapeutic exercises, 97530- Therapeutic activity, 97112- Neuromuscular re-education, 97535- Self Care, 54098- Manual therapy, [phone]- Gait training, [phone]- Orthotic Fit/training, [phone]- Aquatic Therapy, [phone]- Electrical stimulation (manual), Patient/Family education, Balance training, Stair training, Taping, Dry Needling, Joint mobilization, Cognitive remediation, DME instructions, Wheelchair mobility training, Cryotherapy, and Moist heat  PLAN FOR NEXT SESSION: aquatic exercise - LLE strengthening, water walking;    Katerra Ingman, Celeste Cola, PT   09/19/2023, 4:47 PM

## 2023-09-24 ENCOUNTER — Ambulatory Visit: Admitting: Physical Therapy

## 2023-09-24 DIAGNOSIS — R2689 Other abnormalities of gait and mobility: Secondary | ICD-10-CM

## 2023-09-24 DIAGNOSIS — R29818 Other symptoms and signs involving the nervous system: Secondary | ICD-10-CM

## 2023-09-24 DIAGNOSIS — I69154 Hemiplegia and 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 left non-dominant side: Secondary | ICD-10-CM | POA: Diagnosis not present

## 2023-09-24 DIAGNOSIS — R2681 Unsteadiness on feet: Secondary | ICD-10-CM

## 2023-09-25 ENCOUNTER — Telehealth: Payer: Self-pay | Admitting: Physical Medicine & Rehabilitation

## 2023-09-25 NOTE — Telephone Encounter (Signed)
 Patient called in requesting med refill on gabapentin  (NEURONTIN ) 600 MG tablet - and would like it sent to [facility] on file

## 2023-09-26 ENCOUNTER — Encounter: Payer: Self-pay | Admitting: Physical Therapy

## 2023-09-26 NOTE — Therapy (Signed)
 OUTPATIENT PHYSICAL THERAPY NEURO/AQUATIC THERAPY TREATMENT NOTE   Patient Name: Charlotte Hunter MRN: [id number] DOB:02-19-1956, 68 y.o., female Today's Date: 09/26/2023   PCP: [name], Jean M, MD REFERRING PROVIDER: Genetta Kenning, MD  END OF SESSION:  PT End of Session - 09/26/23 1727     Visit Number 5    Number of Visits 9   with eval   Date for PT Re-Evaluation 10/01/23   to allow for scheduling delays   Authorization Type Medicare    PT Start Time 1400    PT Stop Time 1445    PT Time Calculation (min) 45 min    Equipment Utilized During Treatment Other (comment);Gait belt    Activity Tolerance Patient tolerated treatment well    Behavior During Therapy WFL for tasks assessed/performed          Past Medical History:  Diagnosis Date   A-fib ([name])    Aortic insufficiency    Hypertension    Stroke [facility])    Past Surgical History:  Procedure Laterality Date   ABDOMINAL HYSTERECTOMY     ABLATION  05/03/2021   heart   c section  01/1968   KNEE SURGERY Right    meniscus   SHOULDER SURGERY Right 08/2014   Patient Active Problem List   Diagnosis Date Noted   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   ICH (intracerebral hemorrhage) ([name]) 10/01/2021   Paroxysmal atrial fibrillation ([name]) 09/29/2021   Stenosis of intracranial vessel 09/29/2021   Essential hypertension 09/29/2021   Blurry vision 09/29/2021   GERD (gastroesophageal reflux disease) 09/29/2021   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 09/25/2021    ONSET DATE: 05/29/2023  REFERRING DIAG: G89.0 (ICD-10-CM) - Thalamic pain syndrome I63.9,G81.14 (ICD-10-CM) - 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 ([name])  THERAPY DIAG:  Other abnormalities of gait and mobility  Hemiplegia and 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 left non-dominant side ([name])  Unsteadiness on feet  Other symptoms and signs involving the nervous  system  Rationale for Evaluation and Treatment: Rehabilitation  SUBJECTIVE:                                                                                                                                                                                             SUBJECTIVE STATEMENT: Pt reports she is doing better today than she was last week but continues to have a lot of pain - states I don't know what has happened   Per initial eval on 3-26 -25 : Pt reports ongoing thalamic pain syndrome with intermittent numbness in the L side of her face and down  her LLE.    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 Yes: NPRS scale: 8/10 in Lt side of body Pain location: lateral portion of L side of body Pain description: stabbing in heel, then changes to extreme numbness, can feel hot or freezing cold; numbness so bad you want to chop it off; feels rigid in trunk by end of the day, arm and leg get heavy Aggravating factors: nothing in particular, gets worse as the day goes on Relieving factors: gabapentin  along with a muscle relaxer, lidocaine  gel; laying down   PRECAUTIONS: Fall  RED FLAGS: None   WEIGHT BEARING RESTRICTIONS: No  FALLS: Has patient fallen in last 6 months? No  LIVING ENVIRONMENT: Lives with: lives alone Lives in: House/apartment Stairs: No Has following equipment at home: Chiropractor, Environmental consultant - 2 wheeled, Wheelchair (manual), Tour manager, and Ramped entry  PLOF: Independent with basic ADLs, Independent with gait, Independent with transfers, and Requires assistive device for independence  PATIENT GOALS: to work on L hip rotation, pool would give me less gravity to work with  OBJECTIVE:  Note: Objective measures were  completed at Evaluation unless otherwise noted.  DIAGNOSTIC FINDINGS: None updated and relevant to this POC  COGNITION: Overall cognitive status: Within functional limits for tasks assessed   SENSATION: Impaired in L hemibody  EDEMA:  Mild in LLE  POSTURE: rounded shoulders and forward head  LOWER EXTREMITY ROM:     Active  Right Eval Left Eval  Hip flexion    Hip extension    Hip abduction    Hip adduction    Hip internal rotation    Hip external rotation    Knee flexion    Knee extension    Ankle dorsiflexion  decreased  Ankle plantarflexion    Ankle inversion    Ankle eversion     (Blank rows = not tested)  LOWER EXTREMITY MMT:    MMT Right Eval Left Eval  Hip flexion 5 3+  Hip extension    Hip abduction    Hip adduction    Hip internal rotation    Hip external rotation    Knee flexion 5 2-  Knee extension 5 4  Ankle dorsiflexion 4 2  Ankle plantarflexion    Ankle inversion    Ankle eversion    (Blank rows = not tested)  BED MOBILITY:  Mod I per patient report, utilizes bed rail  TRANSFERS: Assistive device utilized: Chiropractor  Sit to stand: Modified independence Stand to sit: Modified independence Chair to chair: Modified independence Floor: not assessed at eval  GAIT: Gait pattern: L hip IR, decreased step length- Left, decreased hip/knee flexion- Left, and decreased ankle dorsiflexion- Left Distance walked: various clinic distances Assistive device utilized: Quad cane large base Level of assistance: Modified independence Comments: patient ambulates in/out of appointment with her [facility], does need increased time  FUNCTIONAL TESTS:  TREATMENT: 09-24-23   Aquatic therapy at [facility] - pool temp 90 degrees  Patient seen for aquatic therapy today.  Treatment took place in water 3.5-4.5 feet deep  depending on activity.  Pt entered and exited the pool via step negotiation with use of bil. Hand rails with CGA with descension for entering pool and CGA to min assist for ascending steps for exiting pool -  using a step by step sequence; gait belt was used with step descension for safety  Pt performed water walking 18' x 6 reps with SBA - no UE support used:  pt performed sideways amb. 18' x 2 reps and backwards 18' x 2 reps with UE support on PT's forearms  Rt hip and knee flexion (1/2 march) standing in place 10 reps for increased LLE weight bearing    Pt performed Lt hip flexion, extension, abduction - no increased resistance used-  10 reps for flexion;  10 reps for Lt hip abduction:  10 reps hip extension small range   Standing Lt hamstring stretch - pt's leg positioned on PT's Rt thigh - 15 sec hold at approx. 50 degrees Rt hip flexion; Runner's stretch LLE 20 sec x 1 rep for Lt hamstring and heel cord stretch  Supine exercises at end of session for pain reduction and joint offloading - supported pt in supine - relaxation only for pain reduction;  pt requested no swaying movement as this produced increased pain sensation  Pt requires buoyancy of water for support for joint unloading and for reduced fall risk with gait training without device than can be safely performed on land. Water current produces perturbations for challenge for static & dynamic standing balance.  Buoyancy of water provides unweighting which assists with pain reduction.        PATIENT EDUCATION: Education details: Eval findings, PT POC Person educated: Patient Education method: Explanation Education comprehension: verbalized understanding and needs further education  HOME EXERCISE PROGRAM: To be reviewed/initiated  GOALS: Goals reviewed with patient? Yes  SHORT TERM GOALS: Target date: 08/20/2023  Pt will be Supervision with initial aquatic HEP for improved strength, balance, transfers and  gait. Baseline: Goal status: In progress - pt has attended 1 aquatic PT sessions since initial eval on 07-09-23   LONG TERM GOALS: Target date: 10/01/2023  Pt will be Supervision with final aquatic HEP for improved strength, balance, transfers and gait. Baseline:  Goal status: INITIAL   ASSESSMENT:  CLINICAL IMPRESSION: Aquatic PT session focused on water walking, LLE AROM with use of viscosity of water only for strengthening and floatation for pain reduction. Pt able to tolerate more active exercise in pool today compared to that in previous session last week.  Pt able to perform water walking without use of floatation device for UE support.  Pt continued to c/o increased pain with active Lt hip extension in standing.  Cont with POC.    OBJECTIVE IMPAIRMENTS: Abnormal gait, decreased balance, decreased endurance, decreased mobility, difficulty walking, decreased ROM, decreased strength, increased edema, impaired perceived functional ability, impaired sensation, impaired tone, impaired UE functional use, and pain.   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  REHAB POTENTIAL: Good  CLINICAL DECISION MAKING: Stable/uncomplicated  EVALUATION COMPLEXITY: Moderate  PLAN:  PT FREQUENCY: 1x/week  PT DURATION: 8 sessions (POC set for 12 weeks to allow for scheduling delays)  PLANNED INTERVENTIONS: 97164- PT Re-evaluation, 97110-Therapeutic exercises, 97530- Therapeutic activity, 97112- Neuromuscular re-education,  97535- Self Care, 13086- Manual therapy, [phone]- Gait training, [phone]- Orthotic Fit/training, [phone]- Aquatic Therapy, [phone]- Electrical stimulation (manual), Patient/Family education, Balance training, Stair training, Taping, Dry Needling, Joint mobilization, Cognitive remediation, DME instructions, Wheelchair mobility training, Cryotherapy, and Moist heat  PLAN FOR NEXT SESSION: aquatic exercise - LLE strengthening, water walking; complete renewal next session   Shaquanna Lycan, Celeste Cola, PT   09/26/2023, 5:28 PM

## 2023-09-29 MED ORDER — GABAPENTIN 600 MG PO TABS: 600.0000 mg | ORAL_TABLET | Freq: Three times a day (TID) | ORAL | 1 refills | Status: DC

## 2023-10-15 ENCOUNTER — Ambulatory Visit: Attending: Physical Medicine & Rehabilitation | Admitting: Physical Therapy

## 2023-10-15 DIAGNOSIS — R29818 Other symptoms and signs involving the nervous system: Secondary | ICD-10-CM | POA: Insufficient documentation

## 2023-10-15 DIAGNOSIS — I69154 Hemiplegia and 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 left non-dominant side: Secondary | ICD-10-CM | POA: Insufficient documentation

## 2023-10-15 DIAGNOSIS — R2689 Other abnormalities of gait and mobility: Secondary | ICD-10-CM | POA: Diagnosis present

## 2023-10-16 ENCOUNTER — Encounter: Payer: Self-pay | Admitting: Physical Therapy

## 2023-10-16 ENCOUNTER — Other Ambulatory Visit: Payer: Self-pay | Admitting: Physical Medicine & Rehabilitation

## 2023-10-16 NOTE — Telephone Encounter (Signed)
 P called and wanted to speak to someone about some difficulties she is having with her medication

## 2023-10-16 NOTE — Therapy (Signed)
 OUTPATIENT PHYSICAL THERAPY NEURO/AQUATIC THERAPY TREATMENT NOTE/Re-Cert   Patient Name: Charlotte Hunter MRN: [id number] DOB:12-22-1955, 68 y.o., female Today's Date: 10/16/2023   PCP: [name], Jean M, MD REFERRING PROVIDER: Carilyn Prentice BRAVO, MD  END OF SESSION:  PT End of Session - 10/16/23 1514     Visit Number 6    Number of Visits 9   with eval   Date for PT Re-Evaluation 10/01/23   to allow for scheduling delays   Authorization Type Medicare    PT Start Time 1400    PT Stop Time 1445    PT Time Calculation (min) 45 min    Equipment Utilized During Treatment Gait belt   barbells   Activity Tolerance Patient tolerated treatment well    Behavior During Therapy WFL for tasks assessed/performed          Past Medical History:  Diagnosis Date   A-fib [facility])    Aortic insufficiency    Hypertension    Stroke [facility])    Past Surgical History:  Procedure Laterality Date   ABDOMINAL HYSTERECTOMY     ABLATION  05/03/2021   heart   c section  01/1968   KNEE SURGERY Right    meniscus   SHOULDER SURGERY Right 08/2014   Patient Active Problem List   Diagnosis Date Noted   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   ICH (intracerebral hemorrhage) ([name]) 10/01/2021   Paroxysmal atrial fibrillation ([name]) 09/29/2021   Stenosis of intracranial vessel 09/29/2021   Essential hypertension 09/29/2021   Blurry vision 09/29/2021   GERD (gastroesophageal reflux disease) 09/29/2021   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 09/25/2021    ONSET DATE: 05/29/2023  REFERRING DIAG: G89.0 (ICD-10-CM) - Thalamic pain syndrome I63.9,G81.14 (ICD-10-CM) - 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 ([name])  THERAPY DIAG:  Other abnormalities of gait and mobility  Hemiplegia and 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 left non-dominant side ([name])  Other symptoms and signs involving the nervous system  Rationale for  Evaluation and Treatment: Rehabilitation  SUBJECTIVE:                                                                                                                                                                                             SUBJECTIVE STATEMENT: Pt had orthotist at [facility] in [place] cut back foot plate on her AFO to allow more movement;  states she is not ready for the articulating joints yet; pt reports increased Rt foot and ankle pain - doesn't know if it is just more nerve pain or if cutting foot plate back is causing the pain to  increase some; pt states she took a Gabapentin  before coming to today's aquatic appt.  Per initial eval on 3-26 -25 : Pt reports ongoing thalamic pain syndrome with intermittent numbness in the L side of her face and down her LLE.  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 Yes: NPRS scale: 5/10 in Lt side of body Pain location: lateral portion of L side of body Pain description: stabbing in heel, then changes to extreme numbness, can feel hot or freezing cold; numbness so bad you want to chop it off; feels rigid in trunk by end of the day, arm and leg get heavy Aggravating factors: nothing in particular, gets worse as the day goes on Relieving factors: gabapentin  along with a muscle relaxer, lidocaine  gel; laying down   PRECAUTIONS: Fall  RED FLAGS: None   WEIGHT BEARING RESTRICTIONS: No  FALLS: Has patient fallen in last 6 months? No  LIVING ENVIRONMENT: Lives with: lives alone Lives in: House/apartment Stairs: No Has following equipment at home: Chiropractor, Environmental consultant - 2 wheeled, Wheelchair (manual), Tour manager, and Ramped entry  PLOF: Independent with basic ADLs, Independent with gait,  Independent with transfers, and Requires assistive device for independence  PATIENT GOALS: to work on L hip rotation, pool would give me less gravity to work with  OBJECTIVE:  Note: Objective measures were completed at Evaluation unless otherwise noted.  DIAGNOSTIC FINDINGS: None updated and relevant to this POC  COGNITION: Overall cognitive status: Within functional limits for tasks assessed   SENSATION: Impaired in L hemibody  EDEMA:  Mild in LLE  POSTURE: rounded shoulders and forward head  LOWER EXTREMITY ROM:     Active  Right Eval Left Eval  Hip flexion    Hip extension    Hip abduction    Hip adduction    Hip internal rotation    Hip external rotation    Knee flexion    Knee extension    Ankle dorsiflexion  decreased  Ankle plantarflexion    Ankle inversion    Ankle eversion     (Blank rows = not tested)  LOWER EXTREMITY MMT:    MMT Right Eval Left Eval  Hip flexion 5 3+  Hip extension    Hip abduction    Hip adduction    Hip internal rotation    Hip external rotation    Knee flexion 5 2-  Knee extension 5 4  Ankle dorsiflexion 4 2  Ankle plantarflexion    Ankle inversion    Ankle eversion    (Blank rows = not tested)  BED MOBILITY:  Mod I per patient report, utilizes bed rail  TRANSFERS: Assistive device utilized: Chiropractor  Sit to stand: Modified independence Stand to sit: Modified independence Chair to chair: Modified independence Floor: not assessed at eval  GAIT: Gait pattern: L hip IR, decreased step length- Left, decreased hip/knee flexion- Left, and decreased ankle dorsiflexion- Left Distance walked: various clinic distances Assistive device utilized: Quad cane large base Level of assistance: Modified independence Comments: patient ambulates in/out of appointment with her [facility], does need increased time  FUNCTIONAL TESTS:  TREATMENT: 10-15-23   Aquatic therapy at [facility] - pool temp 90 degrees  Patient seen for aquatic therapy today.  Treatment took place in water 3.5-4.5 feet deep depending on activity.  Pt entered and exited the pool via step negotiation with use of bil. Hand rails with CGA with descension for entering pool and CGA to min assist for ascending steps for exiting pool -  using a step by step sequence; gait belt was used with step descension for safety  Pt performed water walking 18' x 4 reps with SBA - no UE support used:  pt performed sideways amb. 18' x 2 reps and backwards 18' x 2 reps with UE support on PT's forearms  Rt hip and knee flexion (1/2 march) standing in place 10 reps for increased LLE weight bearing  Seated (on bench in pool)  Lt hamstring stretch - pt's leg positioned on PT's Rt thigh - 15 sec hold x 2 reps:  AFO and water shoe removed - passive ROM and heel cord stretching of Lt ankle -   Standing on LLE - performed hip and knee flexion RLE 10 reps in standing for increased LLE weight bearing  Pt performed forwards water walking at end of session with AFO on LLE 18' x 4 reps  Pt requires buoyancy of water for support for joint unloading and for reduced fall risk with gait training without device than can be safely performed on land. Water current produces perturbations for challenge for static & dynamic standing balance.  Buoyancy of water provides unweighting which assists with pain reduction.        PATIENT EDUCATION: Education details: Eval findings, PT POC Person educated: Patient Education method: Explanation Education comprehension: verbalized understanding and needs further education  HOME EXERCISE PROGRAM: To be reviewed/initiated  GOALS: Goals reviewed with patient? Yes  SHORT TERM GOALS: Target date: 08/20/2023  Pt will be Supervision with initial aquatic HEP for improved strength, balance, transfers and  gait. Baseline: Goal status: In progress - pt has attended 1 aquatic PT sessions since initial eval on 07-09-23   LONG TERM GOALS: Target date: 10/01/2023  Pt will be Supervision with final aquatic HEP for improved strength, balance, transfers and gait. Baseline:  Goal status: IN PROGRESS - 10-15-23   UPDATED LTG's:  Target date 11-12-23                             1.  Pt will be Supervision with final aquatic HEP for improved strength, balance, transfers and gait.    Baseline:     Goal status:  IN PROGRESS     2.  Pt will report reduced pain on Lt side of body and Lt foot/ankle to </= 3/10 for increased tolerance and comfort with mobility and ADL's.      ASSESSMENT:    CLINICAL IMPRESSION: Pt returns to aquatic PT after previous session attended on 09-24-23 due to transportation issues and aquatic PT being on vacation.  Today's aquatic PT session focused on water walking, LLE AROM with use of viscosity of water only for strengthening and PROM Lt ankle due to c/o moderate to severe pain (nerve pain).  Pt had AFO trimmed back to allow more ankle mobility but pt reporting increased ankle pain in today's session - unsure if due to increased nerve pain only, or AFO footplate modification or combination of the two.  Pt able to perform water walking without use of floatation device for  UE support.  Pain continues to limit and impact participation in aquatic exercises.  Renewal to be completed for 3 additional visits. Cont with POC.    OBJECTIVE IMPAIRMENTS: Abnormal gait, decreased balance, decreased endurance, decreased mobility, difficulty walking, decreased ROM, decreased strength, increased edema, impaired perceived functional ability, impaired sensation, impaired tone, impaired UE functional use, and pain.   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   REHAB POTENTIAL: Good  CLINICAL DECISION MAKING: Stable/uncomplicated  EVALUATION COMPLEXITY: Moderate  PLAN:  PT FREQUENCY: 1x/week  PT DURATION: 8 sessions (POC set for 12 weeks to allow for scheduling delays) : renewal for 3 additional visits as pt has received 5/8 aquatic PT sessions  PLANNED INTERVENTIONS: 97164- PT Re-evaluation, 97110-Therapeutic exercises, 97530- Therapeutic activity, 97112- Neuromuscular re-education, 97535- Self Care, [id number]- Manual therapy, [phone]- Gait training, [phone]- Orthotic Fit/training, [phone]- Aquatic Therapy, [phone]- Electrical stimulation (manual), Patient/Family education, Balance training, Stair training, Taping, Dry Needling, Joint mobilization, Cognitive remediation, DME instructions, Wheelchair mobility training, Cryotherapy, and Moist heat  PLAN FOR NEXT SESSION: aquatic exercise - LLE strengthening, water walking; complete renewal next session   Kayln Garceau, [place], PT   10/16/2023, 3:16 PM

## 2023-10-16 NOTE — Telephone Encounter (Signed)
 Having a great deal of trouble with spasticity and resulting pain. Only takes tizanidine  at hs but has occ taken one 1/2 of tizanidine  in the morning and this has helped. She is asking for something more to help with the spasticity. Methocarbamol  makes her too loopy. This increases her pain and keeps her bound to her wheel chair. Please advise.

## 2023-10-17 MED ORDER — TIZANIDINE HCL 2 MG PO TABS: 2.0000 mg | ORAL_TABLET | Freq: Two times a day (BID) | ORAL | 5 refills | Status: DC

## 2023-10-21 NOTE — Telephone Encounter (Addendum)
 Patient states that the Gabapentin  600 mg three times a day is not working for her and she takes Tizanidine  2 mg one at bedtime. She states she wakes early morning with spasticity pain up across her chest and it affects her breathing. She is asking for advice possibly trying Lyrica.

## 2023-10-22 ENCOUNTER — Inpatient Hospital Stay
Admission: RE | Admit: 2023-10-22 | Discharge: 2023-10-22 | Disposition: A | Discharge status: Home / Self Care | Payer: Self-pay | Source: Ambulatory Visit | Attending: Internal Medicine | Admitting: Internal Medicine

## 2023-10-22 ENCOUNTER — Ambulatory Visit: Admitting: Physical Therapy

## 2023-10-22 ENCOUNTER — Other Ambulatory Visit: Payer: Self-pay | Admitting: *Deleted

## 2023-10-22 ENCOUNTER — Other Ambulatory Visit: Payer: Self-pay | Admitting: Internal Medicine

## 2023-10-22 DIAGNOSIS — Z1231 Encounter for screening mammogram for malignant neoplasm of breast: Secondary | ICD-10-CM

## 2023-10-22 DIAGNOSIS — N6321 Unspecified lump in the left breast, upper outer quadrant: Secondary | ICD-10-CM

## 2023-10-23 ENCOUNTER — Telehealth: Payer: Self-pay | Admitting: Physical Medicine & Rehabilitation

## 2023-10-23 NOTE — Telephone Encounter (Signed)
 Patient called back and is asking what she needs to do in regards to medications , she is running out of medication and is fustrated because no one has called her back

## 2023-10-24 NOTE — Telephone Encounter (Signed)
 I spoke with Charlotte Hunter and she would like to try the pregablin if you think it might help. She is still having issues with tizanidine  being too strong. She was asking if there were other meds that might have less sedating qualities.

## 2023-10-27 NOTE — Telephone Encounter (Signed)
 See note attached to refill request. I spoke with Ms Crear on 10/24/23.

## 2023-10-28 ENCOUNTER — Other Ambulatory Visit: Payer: Self-pay

## 2023-10-29 ENCOUNTER — Ambulatory Visit
Admission: RE | Admit: 2023-10-29 | Discharge: 2023-10-29 | Disposition: A | Discharge status: Home / Self Care | Source: Ambulatory Visit | Attending: Internal Medicine | Admitting: Internal Medicine

## 2023-10-29 DIAGNOSIS — N6321 Unspecified lump in the left breast, upper outer quadrant: Secondary | ICD-10-CM

## 2023-10-29 MED ORDER — BACLOFEN 5 MG PO TABS: 1.0000 | ORAL_TABLET | Freq: Two times a day (BID) | ORAL | 0 refills | Status: DC

## 2023-10-29 NOTE — Telephone Encounter (Signed)
 Per Dr Carilyn: we can d/c tizanidine , try baclofen , I think we used it on inpt but we can try again, 5mg  po BID #60 , no RF. Will see how she does on this for 1-2 wks prior to trying switch to pregabalin, . She needs appt with me next month   I sent the baclofen  to pharmacy and she has appt 11/20/23 scheduled with Dr Carilyn. I have spoken with her and given her Dr Carilyn recommendations.

## 2023-10-31 ENCOUNTER — Other Ambulatory Visit: Payer: Self-pay | Admitting: Internal Medicine

## 2023-10-31 DIAGNOSIS — N63 Unspecified lump in unspecified breast: Secondary | ICD-10-CM

## 2023-10-31 DIAGNOSIS — R928 Other abnormal and inconclusive findings on diagnostic imaging of breast: Secondary | ICD-10-CM

## 2023-11-06 ENCOUNTER — Ambulatory Visit
Admission: RE | Admit: 2023-11-06 | Discharge: 2023-11-06 | Disposition: A | Discharge status: Home / Self Care | Source: Ambulatory Visit | Attending: Internal Medicine | Admitting: Internal Medicine

## 2023-11-06 DIAGNOSIS — R928 Other abnormal and inconclusive findings on diagnostic imaging of breast: Secondary | ICD-10-CM | POA: Diagnosis present

## 2023-11-06 DIAGNOSIS — N63 Unspecified lump in unspecified breast: Secondary | ICD-10-CM

## 2023-11-06 DIAGNOSIS — N6012 Diffuse cystic mastopathy of left breast: Secondary | ICD-10-CM | POA: Insufficient documentation

## 2023-11-06 HISTORY — PX: BREAST BIOPSY: SHX20

## 2023-11-06 MED ORDER — LIDOCAINE-EPINEPHRINE 1 %-1:100000 IJ SOLN
5.0000 mL | Freq: Once | INTRAMUSCULAR | Status: AC
  Administered 2023-11-06: 5 mL

## 2023-11-06 MED ORDER — LIDOCAINE 1 % OPTIME INJ - NO CHARGE
2.0000 mL | Freq: Once | INTRAMUSCULAR | Status: AC
  Administered 2023-11-06: 2 mL
  Filled 2023-11-06: qty 2

## 2023-11-07 ENCOUNTER — Telehealth: Payer: Self-pay | Admitting: Physical Medicine & Rehabilitation

## 2023-11-07 LAB — SURGICAL PATHOLOGY

## 2023-11-07 MED ORDER — BACLOFEN 5 MG PO TABS: 1.0000 | ORAL_TABLET | Freq: Three times a day (TID) | ORAL | 0 refills | Status: DC

## 2023-11-07 NOTE — Telephone Encounter (Signed)
 Baclofen  is working well for patient.  Patient can tell it wears off before next dosage.  Patient wanted to know if she can take it 3 times a day or can you increase dosage.  Please call patient.

## 2023-11-12 ENCOUNTER — Ambulatory Visit: Admitting: Physical Therapy

## 2023-11-20 ENCOUNTER — Encounter: Payer: Self-pay | Admitting: Physical Medicine & Rehabilitation

## 2023-11-20 ENCOUNTER — Encounter: Payer: Medicare Other | Attending: Physical Medicine & Rehabilitation | Admitting: Physical Medicine & Rehabilitation

## 2023-11-20 VITALS — BP 138/84 | HR 81 | Ht 66.0 in

## 2023-11-20 DIAGNOSIS — I619 Nontraumatic intracerebral hemorrhage, unspecified: Secondary | ICD-10-CM | POA: Diagnosis present

## 2023-11-20 DIAGNOSIS — G890 Central pain syndrome: Secondary | ICD-10-CM | POA: Insufficient documentation

## 2023-11-20 DIAGNOSIS — G8110 Spastic hemiplegia affecting unspecified side: Secondary | ICD-10-CM | POA: Insufficient documentation

## 2023-11-20 MED ORDER — BACLOFEN 5 MG PO TABS: 1.0000 | ORAL_TABLET | Freq: Three times a day (TID) | ORAL | 1 refills | Status: DC

## 2023-11-20 MED ORDER — GABAPENTIN 800 MG PO TABS: 800.0000 mg | ORAL_TABLET | Freq: Three times a day (TID) | ORAL | 1 refills | Status: DC

## 2023-11-20 NOTE — Progress Notes (Signed)
 Subjective:    Patient ID: Charlotte Hunter, female    DOB: 1955/10/24, 68 y.o.   MRN: [id number]  HPI 68 year old female who had a right thalamic hemorrhage on 09/25/2021.  She had acute onset left-sided weakness and numbness went through inpatient rehab followed by outpatient rehab.  She currently resides at her own home and does not drive Had exacerbation of pain after 1 visit of aquatic therapy  Was taking tizanidine  at night an dwas on Gabapentin  Was having pain coming out of the pool with AFO on  AFO was altered prior to that time  Was doing stretching movements HF, Ext , Abd , Add  more pain in left shoulder , little finger and outside of foot and ankle   Baclofen  5mg  BID (830a and 8p) was initially helpful, feels a little dizzy but mentally clear  Gabapentin  600mg  8a, 1p, 8p   Pain is worsening at 2-3 pm  Had  Was in and out of WC 50/50 but now feels like she is in the wheelchair more than she had been prior to this exacerbation of pain. Pain Inventory Average Pain 8 Pain Right Now 7 My pain is constant, burning, and stabbing  In the last 24 hours, has pain interfered with the following? General activity 8 Relation with others 0 Enjoyment of life 0 What TIME of day is your pain at its worst? daytime, evening, and night Sleep (in general) Good  Pain is worse with: walking, standing, and some activites Pain improves with: rest and heat & medication Relief from Meds: 5  History reviewed. No pertinent family history. Social History   Socioeconomic History   Marital status: Single    Spouse name: Not on file   Number of children: 1   Years of education: Not on file   Highest education level: Not on file  Occupational History   Not on file  Tobacco Use   Smoking status: Never   Smokeless tobacco: Never  Vaping Use   Vaping status: Never Used  Substance and Sexual Activity   Alcohol  use: Not Currently    Alcohol /week: 1.0 standard drink of alcohol     Types: 1  Glasses of wine per week   Drug use: Never   Sexual activity: Not Currently  Other Topics Concern   Not on file  Social History Narrative   12/27/21 dgtr lives with her   Social Drivers of Health   Financial Resource Strain: Low Risk  (03/29/2020)   Received from [facility]   Overall Financial Resource Strain (CARDIA)    Difficulty of Paying Living Expenses: Not very hard  Food Insecurity: No Food Insecurity (07/24/2020)   [facility] from [facility]   Hunger Vital Sign    Within the past 12 months, you worried that your food would run out before you got the money to buy more.: Never true    Within the past 12 months, the food you bought just didn't last and you didn't have money to get more.: Never true  Transportation Needs: No Transportation Needs (03/29/2020)   Received from [facility] - Transportation    Lack of Transportation (Medical): No    Lack of Transportation (Non-Medical): No  Physical Activity: Insufficiently Active (03/29/2020)   Received from [facility]   Exercise Vital Sign    On average, how many days per week do you engage in moderate to strenuous exercise (like a brisk walk)?: 2 days    On average, how many minutes do  you engage in exercise at this level?: 30 min  Stress: No Stress Concern Present (03/29/2020)   Received from [facility] - Occupational Stress Questionnaire    Feeling of Stress : Only a little  Social Connections: Unknown (08/28/2021)   Received from [facility]   Social Network    Social Network: Not on file   Past Surgical History:  Procedure Laterality Date   ABDOMINAL HYSTERECTOMY     ABLATION  05/03/2021   heart   BREAST BIOPSY Left 11/06/2023   US  LT BREAST BX W LOC DEV 1ST LESION IMG BX SPEC US  GUIDE 11/06/2023 [facility]   c section  01/1968   KNEE SURGERY Right    meniscus   SHOULDER SURGERY Right 08/2014   Past Surgical History:  Procedure Laterality Date    ABDOMINAL HYSTERECTOMY     ABLATION  05/03/2021   heart   BREAST BIOPSY Left 11/06/2023   US  LT BREAST BX W LOC DEV 1ST LESION IMG BX SPEC US  GUIDE 11/06/2023 [facility]   c section  01/1968   KNEE SURGERY Right    meniscus   SHOULDER SURGERY Right 08/2014   Past Medical History:  Diagnosis Date   A-fib [facility])    Aortic insufficiency    Hypertension    Stroke ([name])    BP 138/84   Pulse 81   Ht 5' 6 (1.676 m)   SpO2 96%   BMI 28.60 kg/m   Opioid Risk Score:   Fall Risk Score:  `1  Depression screen PHQ 2/9     11/20/2023    1:27 PM 07/29/2023    2:25 PM 07/11/2022    1:06 PM 04/11/2022   12:08 PM 02/19/2022    2:01 PM 12/04/2021    1:22 PM  Depression screen PHQ 2/9  Decreased Interest 0 0 0 0 0 0  Down, Depressed, Hopeless 0 0 0 0 0 0  PHQ - 2 Score 0 0 0 0 0 0  Altered sleeping      0  Tired, decreased energy      0  Change in appetite      0  Feeling bad or failure about yourself       0  Trouble concentrating      0  Moving slowly or fidgety/restless      0  Suicidal thoughts      0  PHQ-9 Score      0    Review of Systems  Musculoskeletal:  Positive for gait problem.       Left torso pain, left elbow pain down to left hand, Left leg pain.  All other systems reviewed and are negative.      Objective:   Physical Exam Motor strength is 3 - at the left deltoid, bicep, tricep, grip 4 - at the hip flexor and knee extensor 2 - ankle plantar flexor Tone MAS 3 at the elbow flexor 1 at the finger and wrist flexors 1 at the knee flexor Speech without dysarthria or aphasia Mood and affect appropriate Attention and concentration are good Sensation absent to light touch in the left upper limb Cerebellar testing evidence of sensory ataxia left upper extremity Ambulation not tested due to lack of assistive device with her today She is able to go from sit to stand with supervision assistance. Wheelchair propulsion is independent She has no evidence of joint  swelling or pain with range of motion in the left upper  limb and left lower limb.       Assessment & Plan:   Thalamic pain syndrome persistent despite gabapentin  60 mg 3 times daily she has tried increasing her gabapentin  on her own from time to time this seemed to be helpful.  She did not note any excessive sedation.  Will increase gabapentin  to 800 mg 3 times daily Spasticity affecting left upper greater than lower limb.  She is tolerating baclofen  we discussed that recommended dose for her is 5 mg 3 times daily she is not experiencing any sedation at this time. We discussed botulinum toxin injection left elbow flexors she would like to proceed with this.  I do think she may benefit from focal spasticity management Will order Dysport 300 units which is equivalent to approximately 60 or 70 units of Botox, plan to inject into the bicep and brachialis muscles on the left side.

## 2023-11-20 NOTE — Patient Instructions (Signed)
 OnabotulinumtoxinA Injection (Medical Use) What is this medication? ONABOTULINUMTOXINA (o na BOTT you lye num tox in eh) treats severe muscle spasms. It may also be used to prevent migraine headaches. It can treat excessive sweating when other medications do not work well enough. This medicine may be used for other purposes; ask your health care provider or pharmacist if you have questions. COMMON BRAND NAME(S): Botox What should I tell my care team before I take this medication? They need to know if you have any of these conditions: Breathing problems Cerebral palsy spasms Difficulty urinating Heart problems History of surgery where this medication is going to be used Infection at the site where this medication is going to be used Myasthenia gravis or other neurologic disease Nerve or muscle disease Surgery plans Take medications that treat or prevent blood clots Thyroid problems An unusual or allergic reaction to botulinum toxin, albumin, other medications, foods, dyes, or preservatives Pregnant or trying to get pregnant Breast-feeding How should I use this medication? This medication is for injected into a muscle. It is given by your care team in a hospital or clinic setting. A special MedGuide will be given to you before each treatment. Be sure to read this information carefully each time. Talk to your care team about the use of this medication in children. While this medication may be prescribed for children as young as 2 years for selected conditions, precautions do apply. Overdosage: If you think you have taken too much of this medicine contact a poison control center or emergency room at once. NOTE: This medicine is only for you. Do not share this medicine with others. What if I miss a dose? This does not apply. What may interact with this medication? Aminoglycoside antibiotics, such as gentamicin, neomycin, tobramycin Muscle relaxants Other botulinum toxin injections This  list may not describe all possible interactions. Give your health care provider a list of all the medicines, herbs, non-prescription drugs, or dietary supplements you use. Also tell them if you smoke, drink alcohol, or use illegal drugs. Some items may interact with your medicine. What should I watch for while using this medication? Visit your care team for regular check ups. This medication will cause weakness in the muscle where it is injected. Tell your care team if you feel unusually weak in other muscles. Get medical help right away if you have problems with breathing, swallowing, or talking. This medication might make your eyelids droop or make you see blurry or double. If you have weak muscles or trouble seeing do not drive a car, use machinery, or do other dangerous activities. This medication contains albumin from human blood. It may be possible to pass an infection in this medication, but no cases have been reported. Talk to your care team about the risks and benefits of this medication. If your activities have been limited by your condition, go back to your regular routine slowly after treatment with this medication. What side effects may I notice from receiving this medication? Side effects that you should report to your care team as soon as possible: Allergic reactions--skin rash, itching, hives, swelling of the face, lips, tongue, or throat Dryness or irritation of the eyes, eye pain, change in vision, sensitivity to light Infection--fever, chills, cough, sore throat, wounds that don't heal, pain or trouble when passing urine, general feeling of discomfort or being unwell Spread of botulinum toxin effects--unusual weakness or fatigue, blurry or double vision, trouble swallowing, hoarseness or trouble speaking, trouble breathing, loss of bladder  control Trouble passing urine Side effects that usually do not require medical attention (report these to your care team if they continue or are  bothersome): Dry mouth Eyelid drooping Fatigue Headache Pain, redness, or irritation at injection site This list may not describe all possible side effects. Call your doctor for medical advice about side effects. You may report side effects to FDA at 1-800-FDA-1088. Where should I keep my medication? This medication is given in a hospital or clinic and will not be stored at home. NOTE: This sheet is a summary. It may not cover all possible information. If you have questions about this medicine, talk to your doctor, pharmacist, or health care provider.  2024 [employer] Standard (2021-03-29 00:00:00)

## 2023-12-02 ENCOUNTER — Telehealth: Payer: Self-pay | Admitting: Physical Medicine & Rehabilitation

## 2023-12-02 NOTE — Telephone Encounter (Signed)
 Patient would like to speak to someone about side effects of a medication that Dr. Carilyn has patient on.

## 2023-12-03 NOTE — Telephone Encounter (Signed)
 Called patient back no answer, left message

## 2023-12-30 ENCOUNTER — Telehealth: Payer: Self-pay | Admitting: Physical Medicine & Rehabilitation

## 2023-12-30 MED ORDER — TIZANIDINE HCL 2 MG PO CAPS: 2.0000 mg | ORAL_CAPSULE | Freq: Three times a day (TID) | ORAL | 1 refills | Status: AC | PRN

## 2023-12-30 NOTE — Telephone Encounter (Signed)
 Patient was put on Baclofen  and she can't feel her left side at all and difficult to walk.  She is also having some memory issues.  Wants to know if she can go back to Tizanidine  or something else that may work.  Please call patient.

## 2023-12-31 NOTE — Telephone Encounter (Signed)
 I have left the detailed message on the patient's personally identified VM.

## 2024-01-20 ENCOUNTER — Encounter: Payer: Self-pay | Admitting: Physical Medicine & Rehabilitation

## 2024-01-20 ENCOUNTER — Encounter: Attending: Physical Medicine & Rehabilitation | Admitting: Physical Medicine & Rehabilitation

## 2024-01-20 VITALS — BP 146/75 | HR 89 | Ht 66.0 in

## 2024-01-20 DIAGNOSIS — I619 Nontraumatic intracerebral hemorrhage, unspecified: Secondary | ICD-10-CM | POA: Insufficient documentation

## 2024-01-20 DIAGNOSIS — I69154 Hemiplegia and 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 left non-dominant side: Secondary | ICD-10-CM | POA: Diagnosis present

## 2024-01-20 MED ORDER — ONABOTULINUMTOXINA 100 UNITS IJ SOLR
100.0000 [IU] | Freq: Once | INTRAMUSCULAR | Status: AC
  Administered 2024-01-20: 100 [IU] via INTRAMUSCULAR

## 2024-01-20 MED ORDER — SODIUM CHLORIDE (PF) 0.9 % IJ SOLN
2.0000 mL | Freq: Once | INTRAMUSCULAR | Status: AC
  Administered 2024-01-20: 2 mL

## 2024-01-20 MED ORDER — GABAPENTIN 800 MG PO TABS: 800.0000 mg | ORAL_TABLET | Freq: Three times a day (TID) | ORAL | 1 refills | Status: DC

## 2024-01-20 MED ORDER — METHOCARBAMOL 500 MG PO TABS: 500.0000 mg | ORAL_TABLET | Freq: Three times a day (TID) | ORAL | 1 refills | Status: AC | PRN

## 2024-01-20 NOTE — Progress Notes (Signed)
 Botox Injection for spasticity using needle EMG guidance  Dilution: 50 Units/ml Indication: Severe spasticity which interferes with ADL,mobility and/or  hygiene and is unresponsive to medication management and other conservative care Informed consent was obtained after describing risks and benefits of the procedure with the patient. This includes bleeding, bruising, infection, excessive weakness, or medication side effects. A REMS form is on file and signed. Needle: 27g1 needle electrode Number of units per muscle  Biceps25U Brachialis 50U FPL 25U All injections were done after obtaining appropriate EMG activity and after negative drawback for blood. The patient tolerated the procedure well. Post procedure instructions were given. A followup appointment was made.

## 2024-03-02 ENCOUNTER — Encounter: Admitting: Physical Medicine & Rehabilitation

## 2024-03-23 ENCOUNTER — Telehealth: Payer: Self-pay | Admitting: Physical Medicine & Rehabilitation

## 2024-03-23 MED ORDER — GABAPENTIN 800 MG PO TABS: 800.0000 mg | ORAL_TABLET | Freq: Three times a day (TID) | ORAL | 1 refills | Status: AC

## 2024-03-23 NOTE — Telephone Encounter (Signed)
 P called for refill of gabapentin  sent to [facility] [address] [place]  and she said she also needs kirsteins or a nurse's signature for a form from the city that is saying she is unable to bring her trash to the curb due to her staying a lone and having a stroke. She said would it be possible for a nurse to sign this or does she needs kirsteins signature? She stated she was trying to have a friend drive up here to bring it tomorrow

## 2024-06-11 ENCOUNTER — Encounter: Admitting: Physical Medicine & Rehabilitation
